# Patient Record
Sex: Female | Born: 1945
Health system: Southern US, Community
[De-identification: ages and names within clinical notes are randomized; demographics above are authoritative.]

## PROBLEM LIST (undated history)

## (undated) ENCOUNTER — Emergency Department (HOSPITAL_COMMUNITY): Admission: EM | Payer: BLUE CROSS/BLUE SHIELD | Source: Home / Self Care

## (undated) DIAGNOSIS — K8010 Calculus of gallbladder with chronic cholecystitis without obstruction: Secondary | ICD-10-CM

## (undated) DIAGNOSIS — M7551 Bursitis of right shoulder: Secondary | ICD-10-CM

## (undated) DIAGNOSIS — M26609 Unspecified temporomandibular joint disorder, unspecified side: Secondary | ICD-10-CM

## (undated) DIAGNOSIS — E785 Hyperlipidemia, unspecified: Secondary | ICD-10-CM

## (undated) DIAGNOSIS — R918 Other nonspecific abnormal finding of lung field: Secondary | ICD-10-CM

## (undated) DIAGNOSIS — Z96653 Presence of artificial knee joint, bilateral: Secondary | ICD-10-CM

## (undated) DIAGNOSIS — M1711 Unilateral primary osteoarthritis, right knee: Secondary | ICD-10-CM

## (undated) DIAGNOSIS — Z8679 Personal history of other diseases of the circulatory system: Secondary | ICD-10-CM

## (undated) DIAGNOSIS — K219 Gastro-esophageal reflux disease without esophagitis: Secondary | ICD-10-CM

## (undated) DIAGNOSIS — M069 Rheumatoid arthritis, unspecified: Secondary | ICD-10-CM

## (undated) DIAGNOSIS — M1990 Unspecified osteoarthritis, unspecified site: Secondary | ICD-10-CM

## (undated) DIAGNOSIS — I160 Hypertensive urgency: Secondary | ICD-10-CM

## (undated) DIAGNOSIS — E041 Nontoxic single thyroid nodule: Secondary | ICD-10-CM

## (undated) DIAGNOSIS — I10 Essential (primary) hypertension: Secondary | ICD-10-CM

## (undated) DIAGNOSIS — E669 Obesity, unspecified: Secondary | ICD-10-CM

## (undated) DIAGNOSIS — E119 Type 2 diabetes mellitus without complications: Secondary | ICD-10-CM

## (undated) DIAGNOSIS — R6510 Systemic inflammatory response syndrome (SIRS) of non-infectious origin without acute organ dysfunction: Secondary | ICD-10-CM

## (undated) DIAGNOSIS — D649 Anemia, unspecified: Secondary | ICD-10-CM

## (undated) DIAGNOSIS — I455 Other specified heart block: Secondary | ICD-10-CM

## (undated) DIAGNOSIS — L089 Local infection of the skin and subcutaneous tissue, unspecified: Secondary | ICD-10-CM

## (undated) DIAGNOSIS — Z79890 Hormone replacement therapy (postmenopausal): Secondary | ICD-10-CM

## (undated) DIAGNOSIS — IMO0001 Reserved for inherently not codable concepts without codable children: Secondary | ICD-10-CM

## (undated) DIAGNOSIS — K529 Noninfective gastroenteritis and colitis, unspecified: Secondary | ICD-10-CM

## (undated) DIAGNOSIS — E039 Hypothyroidism, unspecified: Secondary | ICD-10-CM

## (undated) DIAGNOSIS — E876 Hypokalemia: Secondary | ICD-10-CM

## (undated) DIAGNOSIS — E440 Moderate protein-calorie malnutrition: Secondary | ICD-10-CM

## (undated) DIAGNOSIS — D62 Acute posthemorrhagic anemia: Secondary | ICD-10-CM

## (undated) DIAGNOSIS — A419 Sepsis, unspecified organism: Secondary | ICD-10-CM

## (undated) DIAGNOSIS — B009 Herpesviral infection, unspecified: Secondary | ICD-10-CM

## (undated) DIAGNOSIS — I4891 Unspecified atrial fibrillation: Secondary | ICD-10-CM

## (undated) DIAGNOSIS — R55 Syncope and collapse: Secondary | ICD-10-CM

## (undated) DIAGNOSIS — M0579 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 without organ or systems involvement: Secondary | ICD-10-CM

## (undated) DIAGNOSIS — R112 Nausea with vomiting, unspecified: Secondary | ICD-10-CM

## (undated) DIAGNOSIS — K760 Fatty (change of) liver, not elsewhere classified: Secondary | ICD-10-CM

## (undated) HISTORY — DX: 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 without organ or systems involvement: M05.79

## (undated) HISTORY — DX: Bursitis of right shoulder: M75.51

## (undated) HISTORY — DX: Rheumatoid arthritis, unspecified: M06.9

## (undated) HISTORY — PX: TONSILLECTOMY: SUR1361

## (undated) HISTORY — PX: JOINT REPLACEMENT: SHX530

## (undated) HISTORY — PX: ABDOMINAL HYSTERECTOMY: SHX81

## (undated) HISTORY — DX: Fatty (change of) liver, not elsewhere classified: K76.0

## (undated) HISTORY — DX: Hyperlipidemia, unspecified: E78.5

## (undated) HISTORY — DX: Unspecified atrial fibrillation: I48.91

## (undated) HISTORY — DX: Obesity, unspecified: E66.9

## (undated) HISTORY — DX: Hormone replacement therapy: Z79.890

## (undated) HISTORY — PX: NASAL SINUS SURGERY: SHX719

---
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
## 1998-07-31 ENCOUNTER — Other Ambulatory Visit: Admission: RE | Admit: 1998-07-31 | Discharge: 1998-07-31 | Payer: Self-pay | Admitting: Obstetrics and Gynecology

## 1999-08-03 ENCOUNTER — Other Ambulatory Visit: Admission: RE | Admit: 1999-08-03 | Discharge: 1999-08-03 | Payer: Self-pay | Admitting: Obstetrics and Gynecology

## 2000-09-23 LAB — HM PAP SMEAR

## 2000-09-26 ENCOUNTER — Other Ambulatory Visit: Admission: RE | Admit: 2000-09-26 | Discharge: 2000-09-26 | Payer: Self-pay | Admitting: Obstetrics and Gynecology

## 2001-09-28 ENCOUNTER — Other Ambulatory Visit: Admission: RE | Admit: 2001-09-28 | Discharge: 2001-09-28 | Payer: Self-pay | Admitting: Obstetrics and Gynecology

## 2002-02-01 ENCOUNTER — Encounter: Payer: Self-pay | Admitting: *Deleted

## 2002-02-06 ENCOUNTER — Inpatient Hospital Stay (HOSPITAL_COMMUNITY): Admission: RE | Admit: 2002-02-06 | Discharge: 2002-02-11 | Payer: Self-pay | Admitting: *Deleted

## 2002-04-05 ENCOUNTER — Inpatient Hospital Stay (HOSPITAL_COMMUNITY): Admission: RE | Admit: 2002-04-05 | Discharge: 2002-04-07 | Payer: Self-pay | Admitting: *Deleted

## 2004-05-05 ENCOUNTER — Encounter: Admission: RE | Admit: 2004-05-05 | Discharge: 2004-05-05 | Payer: Self-pay | Admitting: Family Medicine

## 2005-02-10 ENCOUNTER — Observation Stay (HOSPITAL_COMMUNITY): Admission: EM | Admit: 2005-02-10 | Discharge: 2005-02-11 | Payer: Self-pay | Admitting: Emergency Medicine

## 2005-02-12 ENCOUNTER — Ambulatory Visit: Payer: Self-pay

## 2005-03-25 ENCOUNTER — Ambulatory Visit: Payer: Self-pay | Admitting: Internal Medicine

## 2005-04-05 ENCOUNTER — Encounter: Admission: RE | Admit: 2005-04-05 | Discharge: 2005-07-04 | Payer: Self-pay | Admitting: Internal Medicine

## 2006-08-24 ENCOUNTER — Emergency Department (HOSPITAL_COMMUNITY): Admission: EM | Admit: 2006-08-24 | Discharge: 2006-08-24 | Payer: Self-pay | Admitting: Emergency Medicine

## 2008-08-23 DIAGNOSIS — K760 Fatty (change of) liver, not elsewhere classified: Secondary | ICD-10-CM

## 2008-08-23 HISTORY — DX: Fatty (change of) liver, not elsewhere classified: K76.0

## 2009-02-24 ENCOUNTER — Inpatient Hospital Stay (HOSPITAL_COMMUNITY): Admission: EM | Admit: 2009-02-24 | Discharge: 2009-03-01 | Payer: Self-pay | Admitting: Emergency Medicine

## 2009-02-25 ENCOUNTER — Ambulatory Visit: Payer: Self-pay | Admitting: Gastroenterology

## 2009-02-25 ENCOUNTER — Encounter: Payer: Self-pay | Admitting: Gastroenterology

## 2010-11-29 LAB — CBC
HCT: 34.4 % — ABNORMAL LOW (ref 36.0–46.0)
HCT: 36 % (ref 36.0–46.0)
HCT: 36.2 % (ref 36.0–46.0)
HCT: 40.3 % (ref 36.0–46.0)
HCT: 40.7 % (ref 36.0–46.0)
Hemoglobin: 12.2 g/dL (ref 12.0–15.0)
Hemoglobin: 13.5 g/dL (ref 12.0–15.0)
Hemoglobin: 13.6 g/dL (ref 12.0–15.0)
MCHC: 33.4 g/dL (ref 30.0–36.0)
MCHC: 33.5 g/dL (ref 30.0–36.0)
MCV: 92.4 fL (ref 78.0–100.0)
MCV: 93 fL (ref 78.0–100.0)
MCV: 93.1 fL (ref 78.0–100.0)
MCV: 93.2 fL (ref 78.0–100.0)
MCV: 93.3 fL (ref 78.0–100.0)
Platelets: 212 10*3/uL (ref 150–400)
Platelets: 251 10*3/uL (ref 150–400)
RBC: 3.86 MIL/uL — ABNORMAL LOW (ref 3.87–5.11)
RBC: 3.88 MIL/uL (ref 3.87–5.11)
RBC: 4.41 MIL/uL (ref 3.87–5.11)
RDW: 15.4 % (ref 11.5–15.5)
RDW: 15.9 % — ABNORMAL HIGH (ref 11.5–15.5)
RDW: 16 % — ABNORMAL HIGH (ref 11.5–15.5)
WBC: 11.1 10*3/uL — ABNORMAL HIGH (ref 4.0–10.5)
WBC: 7.8 10*3/uL (ref 4.0–10.5)
WBC: 8 K/uL (ref 4.0–10.5)

## 2010-11-29 LAB — COMPREHENSIVE METABOLIC PANEL
ALT: 42 U/L — ABNORMAL HIGH (ref 0–35)
AST: 48 U/L — ABNORMAL HIGH (ref 0–37)
Albumin: 3.3 g/dL — ABNORMAL LOW (ref 3.5–5.2)
Albumin: 4.1 g/dL (ref 3.5–5.2)
Alkaline Phosphatase: 88 U/L (ref 39–117)
BUN: 11 mg/dL (ref 6–23)
Calcium: 11.1 mg/dL — ABNORMAL HIGH (ref 8.4–10.5)
Creatinine, Ser: 0.57 mg/dL (ref 0.4–1.2)
GFR calc Af Amer: >60 mL/min (ref >60)
GFR calc Af Amer: >60 mL/min (ref >60)
Potassium: 3.5 mEq/L (ref 3.5–5.1)
Potassium: 4 mEq/L (ref 3.5–5.1)
Sodium: 141 mEq/L (ref 135–145)
Total Protein: 6.2 g/dL (ref 6.0–8.3)
Total Protein: 7.8 g/dL (ref 6.0–8.3)

## 2010-11-29 LAB — URINE CULTURE
Colony Count: NO GROWTH
Culture: NO GROWTH

## 2010-11-29 LAB — POCT CARDIAC MARKERS
CKMB, poc: <1 ng/mL — ABNORMAL LOW (ref 1.0–8.0)
CKMB, poc: <1 ng/mL — ABNORMAL LOW (ref 1.0–8.0)
Myoglobin, poc: 45.8 ng/mL (ref 12–200)
Myoglobin, poc: 86.2 ng/mL (ref 12–200)
Troponin i, poc: <0.05 ng/mL (ref 0.00–0.09)
Troponin i, poc: <0.05 ng/mL (ref 0.00–0.09)

## 2010-11-29 LAB — GLUCOSE, CAPILLARY
Glucose-Capillary: 100 mg/dL — ABNORMAL HIGH (ref 70–99)
Glucose-Capillary: 100 mg/dL — ABNORMAL HIGH (ref 70–99)
Glucose-Capillary: 103 mg/dL — ABNORMAL HIGH (ref 70–99)
Glucose-Capillary: 104 mg/dL — ABNORMAL HIGH (ref 70–99)
Glucose-Capillary: 104 mg/dL — ABNORMAL HIGH (ref 70–99)
Glucose-Capillary: 107 mg/dL — ABNORMAL HIGH (ref 70–99)
Glucose-Capillary: 108 mg/dL — ABNORMAL HIGH (ref 70–99)
Glucose-Capillary: 112 mg/dL — ABNORMAL HIGH (ref 70–99)
Glucose-Capillary: 113 mg/dL — ABNORMAL HIGH (ref 70–99)
Glucose-Capillary: 114 mg/dL — ABNORMAL HIGH (ref 70–99)
Glucose-Capillary: 117 mg/dL — ABNORMAL HIGH (ref 70–99)
Glucose-Capillary: 117 mg/dL — ABNORMAL HIGH (ref 70–99)
Glucose-Capillary: 117 mg/dL — ABNORMAL HIGH (ref 70–99)
Glucose-Capillary: 118 mg/dL — ABNORMAL HIGH (ref 70–99)
Glucose-Capillary: 120 mg/dL — ABNORMAL HIGH (ref 70–99)
Glucose-Capillary: 122 mg/dL — ABNORMAL HIGH (ref 70–99)
Glucose-Capillary: 122 mg/dL — ABNORMAL HIGH (ref 70–99)
Glucose-Capillary: 123 mg/dL — ABNORMAL HIGH (ref 70–99)
Glucose-Capillary: 125 mg/dL — ABNORMAL HIGH (ref 70–99)
Glucose-Capillary: 129 mg/dL — ABNORMAL HIGH (ref 70–99)
Glucose-Capillary: 130 mg/dL — ABNORMAL HIGH (ref 70–99)
Glucose-Capillary: 136 mg/dL — ABNORMAL HIGH (ref 70–99)
Glucose-Capillary: 137 mg/dL — ABNORMAL HIGH (ref 70–99)
Glucose-Capillary: 137 mg/dL — ABNORMAL HIGH (ref 70–99)
Glucose-Capillary: 139 mg/dL — ABNORMAL HIGH (ref 70–99)
Glucose-Capillary: 141 mg/dL — ABNORMAL HIGH (ref 70–99)
Glucose-Capillary: 176 mg/dL — ABNORMAL HIGH (ref 70–99)
Glucose-Capillary: 89 mg/dL (ref 70–99)
Glucose-Capillary: 94 mg/dL (ref 70–99)
Glucose-Capillary: 95 mg/dL (ref 70–99)
Glucose-Capillary: 98 mg/dL (ref 70–99)
Glucose-Capillary: 99 mg/dL (ref 70–99)
Glucose-Capillary: 99 mg/dL (ref 70–99)

## 2010-11-29 LAB — BASIC METABOLIC PANEL
BUN: 5 mg/dL — ABNORMAL LOW (ref 6–23)
BUN: 7 mg/dL (ref 6–23)
BUN: 8 mg/dL (ref 6–23)
CO2: 24 mEq/L (ref 19–32)
CO2: 24 mEq/L (ref 19–32)
Calcium: 8.9 mg/dL (ref 8.4–10.5)
Chloride: 100 mEq/L (ref 96–112)
Chloride: 102 mEq/L (ref 96–112)
Chloride: 104 mEq/L (ref 96–112)
Chloride: 107 mEq/L (ref 96–112)
Creatinine, Ser: 0.51 mg/dL (ref 0.4–1.2)
Creatinine, Ser: 0.57 mg/dL (ref 0.4–1.2)
GFR calc Af Amer: >60 mL/min (ref >60)
GFR calc Af Amer: >60 mL/min (ref >60)
GFR calc non Af Amer: >60 mL/min (ref >60)
GFR calc non Af Amer: >60 mL/min (ref >60)
GFR calc non Af Amer: >60 mL/min (ref >60)
Glucose, Bld: 122 mg/dL — ABNORMAL HIGH (ref 70–99)
Potassium: 3.7 mEq/L (ref 3.5–5.1)
Potassium: 3.8 mEq/L (ref 3.5–5.1)
Potassium: 4 mEq/L (ref 3.5–5.1)
Sodium: 135 mEq/L (ref 135–145)
Sodium: 139 mEq/L (ref 135–145)

## 2010-11-29 LAB — COMPREHENSIVE METABOLIC PANEL WITH GFR
BUN: 14 mg/dL (ref 6–23)
CO2: 25 meq/L (ref 19–32)
Chloride: 101 meq/L (ref 96–112)
Creatinine, Ser: 0.79 mg/dL (ref 0.4–1.2)
GFR calc non Af Amer: >60 mL/min (ref >60)
Glucose, Bld: 159 mg/dL — ABNORMAL HIGH (ref 70–99)
Total Bilirubin: 0.9 mg/dL (ref 0.3–1.2)

## 2010-11-29 LAB — MAGNESIUM
Magnesium: 1.5 mg/dL (ref 1.5–2.5)
Magnesium: 1.6 mg/dL (ref 1.5–2.5)
Magnesium: 1.7 mg/dL (ref 1.5–2.5)

## 2010-11-29 LAB — HEMOGLOBIN A1C
Hgb A1c MFr Bld: 6.3 % — ABNORMAL HIGH (ref 4.6–6.1)
Mean Plasma Glucose: 134 mg/dL

## 2010-11-29 LAB — URINALYSIS, ROUTINE W REFLEX MICROSCOPIC
Glucose, UA: 100 mg/dL — AB
Ketones, ur: 40 mg/dL — AB
Nitrite: NEGATIVE
Protein, ur: >300 mg/dL — AB
Specific Gravity, Urine: 1.041 — ABNORMAL HIGH (ref 1.005–1.030)
Urobilinogen, UA: 1 mg/dL (ref 0.0–1.0)
pH: 5.5 (ref 5.0–8.0)

## 2010-11-29 LAB — URINE MICROSCOPIC-ADD ON

## 2010-11-29 LAB — DIFFERENTIAL
Basophils Absolute: 0 K/uL (ref 0.0–0.1)
Basophils Relative: 0 % (ref 0–1)
Eosinophils Absolute: 0 10*3/uL (ref 0.0–0.7)
Eosinophils Relative: 0 % (ref 0–5)
Lymphocytes Relative: 12 % (ref 12–46)
Lymphs Abs: 0.9 10*3/uL (ref 0.7–4.0)
Monocytes Absolute: 0.2 10*3/uL (ref 0.1–1.0)
Monocytes Relative: 3 % (ref 3–12)
Neutro Abs: 6.9 K/uL (ref 1.7–7.7)
Neutrophils Relative %: 85 % — ABNORMAL HIGH (ref 43–77)

## 2010-11-29 LAB — T4, FREE: Free T4: 0.77 ng/dL — ABNORMAL LOW (ref 0.80–1.80)

## 2010-11-29 LAB — TSH
TSH: 1.202 u[IU]/mL (ref 0.350–4.500)
TSH: 3.218 u[IU]/mL (ref 0.350–4.500)

## 2010-11-29 LAB — LACTIC ACID, PLASMA: Lactic Acid, Venous: 2.2 mmol/L (ref 0.5–2.2)

## 2010-11-29 LAB — LIPASE, BLOOD: Lipase: 16 U/L (ref 11–59)

## 2011-01-05 NOTE — H&P (Signed)
NAMECHANTILLY, [name] NO.:  [id number]   MEDICAL RECORD NO.:  [id number]          PATIENT TYPE:  INP   LOCATION:  1531                         FACILITY:  [facility]   PHYSICIAN:  Vania Rea, M.D. DATE OF BIRTH:  Mar 28, 1946   DATE OF ADMISSION:  02/23/2009  DATE OF DISCHARGE:                              HISTORY & PHYSICAL   PRIMARY CARE PHYSICIAN:  [facility]   CHIEF COMPLAINT:  Abdominal pain and vomiting since yesterday.   HISTORY OF PRESENT ILLNESS:  This is 65 year old obese Caucasian lady  with a history of diabetes, hypertension and hyperlipidemia who was in  her baseline state of health until the sudden onset of epigastric and  right upper quadrant pain radiating to the right subscapular area since  yesterday.  The patient says the pain has been steady and unrelenting  and for the past 12-24 hours, she has been having persistent vomiting of  bilious yellow material.  She has not noticed any fever or chills and  had no diarrhea.  She does not suffer with dyspepsia and has not had any  unusual meals.  The pain does not seem to be related to food.  She has  never had any similar problems.   PAST MEDICAL HISTORY:  1. Hypertension.  2. Diabetes.  3. Hyperlipidemia.   SURGICAL HISTORY:  She is status post left knee replacement,  hysterectomy, tonsillectomy and sinus surgery.   MEDICATIONS:  1. Benicar/hydrochlorothiazide 40/12.5 daily.  2. Metoprolol 12.5 mg twice daily.  3. Amlodipine 10 mg daily.  4. Clonidine 0.2 mg twice daily.  5. Simvastatin 40 mg at bedtime.  6. Avandamet 09/998 mg twice daily.  7. Estradiol 0.5 mg twice daily.  8. Aspirin 81 mg daily.  9. Fish oil 1000 mg daily.  10.Caltrate 600 with D twice daily.  11.Multivitamin daily.   ALLERGIES:  No known drug allergies.   SOCIAL HISTORY:  She denies tobacco or illicit drug use.  She very  occasionally drinks alcohol.  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   FAMILY HISTORY:  Significant for coronary artery disease in her father,  breast cancer in her mother.  Diabetes and hypertension also strong her  family.   REVIEW OF SYSTEMS:  On a 10-point review of systems, other than noted  above, unremarkable   PHYSICAL EXAMINATION:  An obese middle-aged Caucasian lady lying in bed.  She had a dose of Dilaudid 4 hours ago and she is now feeling a lot  better.  She is looking more comfortable.  VITAL SIGNS:  Temperature is 99.1, respirations 20, pulse 102, blood  pressure 158/79.  She is saturating 95% on room air.  Pupils are round, equal and reactive.  Mucous membranes pink and  anicteric.  She is mildly dehydrated.  No cervical lymphadenopathy or  thyromegaly.  No jugular venous distention.  CHEST:  Clear to auscultation bilaterally.  CARDIOVASCULAR SYSTEM:  Regular rhythm.  No murmur heard.  ABDOMEN:  Obese and soft but she does have exquisite epigastric  tenderness and also right  upper quadrant tenderness less so.  Normal  abdominal bowel sounds.  EXTREMITIES:  Without edema.  She has 2+ dorsalis pedis pulses  bilaterally and she has arthritic deformities of both knees.  SKIN:  Warm and dry.  There are no ulcerations.  CENTRAL NERVOUS SYSTEM:  Cranial nerves II-XII are grossly intact and  she has no focal neurologic deficit.   LABORATORY DATA:  White count is 8.0, hemoglobin 13.6, MCV 92.4,  platelet 255.  She has a normal differential on her white count.  Her  complete metabolic panel is significant for sodium of 141, potassium  4.0, chloride 101, CO2 25, BUN 14, creatinine 0.79, glucose 159.  Her  AST is slightly elevated at 48, ALT slightly elevated at 42, alkaline  phosphatase normal at 88, total bilirubin normal 0.9.  Her calcium is  elevated to 11.1, total protein and albumin are normal at 7.8 and 4.1.  Lipase is normal.  Lactic acid is normal.  Her point of care cardiac  enzymes are completely normal with  repeatedly undetectable CK-MB and  troponin.  Urinalysis shows concentrated urine with specific gravity of  1.04, 100 glucose, moderate bilirubin, 40 ketones, small amount of  blood, 300 protein, negative nitrates, leukocyte esterase small.  Microscopy shows crystals, 11-20 white cells and a few bacteria.   Ultrasound of her abdomen revealed no definite evidence of gallstones,  though extrahepatic and questionable mild intrahepatic biliary  dilatation of  unclear etiology, diffuse fatty infiltration of the  liver, right renal cyst, mild right collecting system dilation  bilaterally.  Abdominal x-rays show no acute abnormality.   ASSESSMENT:  1. Probable acute cholecystitis with probable passage of gallstone.  2. Dehydration  3. Diabetes type 2 controlled.  4. Hypertension.  5. Hyperlipidemia.  6. Obesity.   PLAN:  Will admit this lady for IV fluid rehydration, pain control.  Will get an MRCP of the abdomen and consult surgeons depending on  results.  Other plans as per orders.      Vania Rea, M.D.  Electronically Signed     LC/MEDQ  D:  02/24/2009  T:  02/24/2009  Job:  [id number]   cc:   Lear Ng, MD  Fax: [phone]   Deanna Artis, MD

## 2011-01-05 NOTE — Consult Note (Signed)
[name], [name] NO.:  [id number]   MEDICAL RECORD NO.:  [id number]          PATIENT TYPE:  INP   LOCATION:  1531                         FACILITY:  [facility]   PHYSICIAN:  Anselmo Rod, M.D.  DATE OF BIRTH:  [birth year]   DATE OF CONSULTATION:  02/24/2009  DATE OF DISCHARGE:                                 CONSULTATION   Cross cover for [facility].   REASON FOR CONSULTATION:  Abdominal pain with nausea and vomiting for 2  days.   ASSESSMENT:  1. Severe epigastric pain with nausea and vomiting, rule out viral      syndrome.  2. Abnormal LFTs with slight elevation of AST, ALT and dilated bile      ducts, ?passed stone.  3. Hypertension.  4. Fatty infiltration of the liver on MRCP.  5. Hyperlipidemia.  6. Adult onset diabetes mellitus.  7. Obesity.  8. Status post hysterectomy.  9. Status post tonsillectomy.  10.History of sinus surgery.  11.Left knee replacement.   RECOMMENDATIONS:  1. EGD in a.m.  2. PPI.  3. Avoid nonsteroidals and greasy foods.   DISCUSSION:  [name] is a 65 year old white female with above-  mentioned medical problems who was in the usual state of health until  February 21, 2009, when she had hot dogs with potato salad and ice cream for  dinner.  When she woke up on February 22, 2009, she has severe nausea,  vomiting with epigastric pain radiating to her back.  As the pain was  severe and unrelenting, she presented to the [facility] emergency room  where she was admitted for further observation and treatment.  She  claims that there was no associated fever, chills or rigors, diarrhea or  constipation. There is no history of reflux, dysphagia or odynophagia.  Appetite is good and weight has been stable.  The pain was not related  to immediate intake of fatty meal.   PAST MEDICAL HISTORY:  See list above.   ALLERGIES:  ?MONOPRIL.   MEDICATIONS:  In the hospital:  1. Ciprofloxacin.  2. Vasotec.  3. Insulin  sliding scale coverage.  4. Magnesium sulfate.  5. Lopressor.  6. Pantoprazole.  7. Acetaminophen.  8. Dulcolax.  9. Hydralazine.  10.Hydromorphone injection.  11.Zofran p.r.n.   SOCIAL HISTORY:  [place]  She is  divorced.  She is Control and instrumentation engineer by religion. She denies the use of alcohol,  tobacco or illicit drugs.  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   FAMILY HISTORY:  Is positive for coronary artery disease in her father  and breast cancer in her mother. Diabetes and hypertension in several  family members.   REVIEW OF SYSTEMS:  1. Nausea, vomiting, and epigastric pain.  No weight loss.  2. No history of dysphagia or odynophagia.  3. No genitourinary or cardiorespiratory complaints.  4. Other review of systems is negative.   PHYSICAL EXAMINATION:  GENERAL PHYSICAL EXAMINATION: The patient is an  older white female lying comfortably in bed in no acute distress  with  temperature of 98.6, blood pressure 136/78, pulse 96 per minute,  respiratory rate 18.  HEENT EXAMINATION: Reveals atraumatic, normocephalic head.  Facial  symmetry preserved.  Oropharyngeal mucosa without exudate.  NECK:  Supple.  No JVD, thyromegaly, lymphadenopathy.  CHEST: Clear to auscultation.  S1-S2 regular.  ABDOMEN: Soft with epigastric tenderness on palpation with guarding.  No  rebound or rigidity.  No hepatosplenomegaly appreciated.  RECTAL EXAMINATION:  Was deferred.   LABORATORY EVALUATION:  On admission revealed white count of 8000 with a  hemoglobin of 13.6 mL and platelets of 251, 85% neutrophils. CMP was  normal except for glucose of 159 and AST of 48 and ALT 42. Calcium was  high at 11.1. Lipase was normal at 16. Cardiac enzymes were negative.  CBC today revealed a hemoglobin of 11.5 with a MCV of 93.0, platelets  212,000, white count was normal.  CMET was normal limit except for  albumin of 3.3.  Hemoglobin A1c was slightly high at 6.3.   TSH was  normal at 1.202.  MRCP done today revealed some dilated bile ducts with  no evidence of cholecystitis and no focal liver abnormalities, mild  fatty infiltration of the liver was noted.  There was no evidence of  choledocholithiasis. Abdominal ultrasound done yesterday revealed well  distended gallbladder without stones or thickening. There were dilated  ducts noted with mild dilatation of the renal collecting systems.   PLAN:  As above.  Further recommendations will be made in follow-up.      Anselmo Rod, M.D.  Electronically Signed     JNM/MEDQ  D:  02/24/2009  T:  02/24/2009  Job:  [id number]   cc:   [facility]  [facility]

## 2011-01-08 NOTE — Discharge Summary (Signed)
NAMENIEVE, ROJERO                 ACCOUNT NO.:  [id number]   MEDICAL RECORD NO.:  [id number]          PATIENT TYPE:  INP   LOCATION:  1531                         FACILITY:  [facility]   PHYSICIAN:  Hind I Elsaid, MD      DATE OF BIRTH:  July 16, 1946   DATE OF ADMISSION:  02/23/2009  DATE OF DISCHARGE:  03/01/2009                               DISCHARGE SUMMARY   DISCHARGE DIAGNOSES:  1. Nausea and vomiting, felt to be secondary to gastroenteritis.  2. Esophagitis.  3. Hypertension.  4. Leukocytosis, resolved.  5. Diabetes mellitus.  6. Hypomagnesemia.  7. Gastroesophageal reflux disease.  8. Hypokalemia.  9. Fatty infiltration of the liver.   DISCHARGE MEDICATIONS:  1. Protonix 40 mg p.o. b.i.d.  2. Zofran 2 mg p.o. q.6 h., as needed.  3. Ativan 10 mg p.o. q.8 h., as needed.  4. Benicar/hydrochlorothiazide 40/12.5 one daily.  5. Metoprolol 12.5 mg twice daily.  6. Norvasc 10 mg daily.  7. Clonidine 0.2 mg twice daily.  8. Zocor 40 mg daily.  9. Avandamet twice daily.  10.Aspirin 81 mg daily.  11.Fish oil 1 tab daily.  12.Caltrate 1 tab twice daily.  13.Multivitamin 1 tab daily.   CONSULTATION:  GI consulted.   PROCEDURES:  1. Abdominal x-ray, no acute finding.  Elevated right hemidiaphragm.  2. Ultrasound of the abdomen, no definite gallstones are visualized.      Extrahepatic and questionable mild intrahepatic biliary dilatation      of uncertain etiology.  Diffuse fatty infiltration of the liver.      Right renal cyst.  3. Chest x-ray, no acute cardiopulmonary process.  4. MRCP.  Normal gallbladder without specific features to suggest      acute cholecystitis, increased caliber of common bile duct without      evidence of choledocholithiasis or mass.  5. Abdominal x-ray, no acute finding.   HISTORY OF PRESENT ILLNESS:  This is a 65 year old Caucasian female with  a history of diabetes, hypertension and hyperlipidemia admitted with  sudden onset of epigastric and right  upper quadrant pain radiating to  the right subscapular area.  The patient said the pain is steady and  unrelieving and it is also associated with persistent vomiting of  bilious yellow material.  She did not notice any fever or chills.  The  patient admitted for evaluation of abdominal pain, nausea and vomiting.  Noticed to have mild elevated LFTs.  Diagnosis was retroviral syndrome.  The patient had endoscopy done by GI.  Results were negative, mainly  erosive esophagitis.  The patient placed on Protonix.  Gastroenterology  recommendation was probably secondary to gastroenteritis.  The patient  remained on supportive measures.  She had a biopsy which showed benign  mild inflamed gastroesophageal junction mucosa, consistent with reflux,  no intestinal metaplasia, dysplasia or malignancy identified.  During  hospital stay, the patient followed closely and mainly with symptom  management.  On the day of discharge, the patient's symptoms had  completely resolved.  The patient was tolerating a diet.  She had a  regular bowel movement and  tolerating a regular diet.  No fever.  No  further nausea, vomiting or abdominal pain secondary to gastroenteritis  or esophagitis.  Leukocytosis completely resolved.  Hypertension, the  patient remained on her home medication.  At this time, I feel the  patient is medically stable for discharge.      Hind Bosie Helper, MD  Electronically Signed     HIE/MEDQ  D:  03/31/2009  T:  03/31/2009  Job:  [id number]

## 2011-01-08 NOTE — H&P (Signed)
NAMEALVETTA, [name] NO.:  [id number]   MEDICAL RECORD NO.:  [id number]          PATIENT TYPE:  INP   LOCATION:  1828                         FACILITY:  [facility]   PHYSICIAN:  Hettie Holstein, D.O.    DATE OF BIRTH:  1946/07/13   DATE OF ADMISSION:  02/10/2005  DATE OF DISCHARGE:                                HISTORY & PHYSICAL   PRIMARY CARE PHYSICIAN:  Dorene Grebe, M.D., of [facility]   CHIEF COMPLAINT:  Presyncope, chest discomfort.   HISTORY OF PRESENT ILLNESS:  Ms. Skipper is a 65 year old diabetic female with  hypertension, hypercholesterolemia, and no previous coronary history who  developed bradycardia and symptoms in an outpatient setting yesterday. She  had been feeling restless and unwell over the past couple of days and some  vague chest discomfort. She was referred to the emergency department for  cardiac evaluation. In the emergency department she was asymptomatic, her  rate did come up. It was noted that she was on both Cardizem and Toprol,  however with her risk factors and family history she is being admitted for  further evaluation, possible further risk stratification.   PAST MEDICAL HISTORY:  1.  Hypercholesterolemia. She does have a fatty liver.  2.  Hypertension.  3.  History of diabetes mellitus.  4.  Status post hysterectomy.  5.  Left knee arthroplasty in the past.   HOME MEDICATIONS:  1.  Multivitamin.  2.  Cardizem 360 mg daily.  3.  Metoprolol 25 mg p.o. b.i.d.  4.  Estrace.  5.  Metformin 500 b.i.d.  6.  Benicar/HCT 40/12.5 mg daily.  7.  Crestor 10 mg daily.   ALLERGIES:  No known drug allergies. She does have a cough with Monopril,  however.   FAMILY HISTORY:  Significant for diabetes and coronary artery disease on  both sides of her family.   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 She has no smoking  or tobacco history. Her family member contact is her sister, Marja Kays,  [phone].   REVIEW OF SYSTEMS:  She describes some shortness of breath and vague chest  discomfort. No nausea, vomiting, or diarrhea. She has had lightheadedness  and dizziness lately. Otherwise, she reports no other symptoms and her  review of systems is otherwise unremarkable.   PHYSICAL EXAMINATION:  GENERAL: In the emergency department she was  ambulated. She did fairly well without exertional chest pain or dyspnea. She  is alert and oriented, in no acute distress.  VITAL SIGNS: Her blood pressure is 164/80, heart rate increased from 60s to  90s, O2 saturation 94% on room air, temperature 98.7.  HEENT: Head is normocephalic and atraumatic. Extraocular movements are  intact.  CARDIOVASCULAR: Normal S1 and S2. No murmur or gallop.  LUNGS: Clear to auscultation bilaterally.  ABDOMEN: Soft, nontender. No palpable hepatosplenomegaly or masses.  EXTREMITIES: No edema. Good peripheral pulses.  NEUROLOGIC: No focal neurologic deficits.   LABORATORY DATA:  Her LFTs are within normal limits. Sodium 141, potassium  4.4, BUN 18, creatinine 0.8,  glucose 83.  Point of care are unremarkable on  admission. INR 1.0.   EKG reveals normal sinus rhythm with acute changes. There is some  questionable infarct anteriorly, but age indeterminate. A little change in  comparison to previous tracings.   ASSESSMENT:  1.  Vague chest discomfort in a female with risk factors.  2.  Near syncope.  3.  Bradycardia felt to be related to medications.  4.  Diabetes mellitus.  5.  Hypercholesterolemia.  6.  Hypertension.   PLAN AT THIS TIME:  We are going to observe Ms. Skellenger over the next 23  hours. Cycle markers, and if these are unremarkable she can receive further  risk stratification studies in the outpatient setting and will adjust her  weight control and her hypertensive medications on discharge. At this time I  am holding her Cardizem, continue her Toprol at a lower dose as well as   Benicar.       ESS/MEDQ  D:  02/10/2005  T:  02/10/2005  Job:  [id number]   cc:   Dorene Grebe, M.D.  [facility]  [address] [place], [place] [address]

## 2011-01-08 NOTE — Op Note (Signed)
[place]  Patient:    [name], [name] Visit Number: [id number] MRN: [id number]          Service Type: SUR Location: [id number] Attending Physician:  Maryanna Shape Dictated by:   Reynolds Bowl, M.D. Proc. Date: 02/06/02 Admit Date:  02/06/2002                             Operative Report  PREOPERATIVE DIAGNOSIS:  Osteoarthritis tricompartmental, left knee.  POSTOPERATIVE DIAGNOSIS:  Osteoarthritis tricompartmental, left knee.  OPERATION:  Left total knee arthroplasty.  ANESTHESIA:  General.  SURGEON:  Reynolds Bowl, M.D.  ASSISTANT:  Cammy Copa, M.D.  DESCRIPTION OF PROCEDURE:  An IV was started, was given 1 g of Ancef, then taken to the operating room where in the supine position she was given a general anesthetic by endotracheal tube.  A proximal pneumatic tourniquet was applied to her proximal thigh.  She had a Foley catheter inserted, then a soft bump was placed under her left hip, and the hip tourniquet was isolated with a U-drape.  She was then prepped from the edges of the U-drape to the tips of the toes with Duraprep.  She was then draped in usual manner, and this included the use of two Vi-drapes.  The knee was entered through a straight anterior incision, began above the patella, angled slightly to the medial side of the patellar tendon.  This was carried down to the quadriceps tendon and the medial edge of the patellar tendon at the level of the prepatellar bursa.  Soft tissues were pushed to the side enough to expose the medial retinaculum and the quadriceps tendon was divided to leave 0.25 inch of it intact medially.  On opening the joint, a moderate amount of synovial fluid was released.  Patella was everted.  The patella had osteophytes all the way around, as did the femur and tibia.  A lot of these were trimmed.  The medial side was down embriated bone, and laterally there was a marked amount of  arthrosis.  The distal femur was resected 12 mm and 5 degrees of valgus using the intramedullary guide.  We then used the sizer and determined that the distal femur size should be sized #7, and that #5 would be too small.  Using the epicondylar line and the trochlear center, we determined the rotation axis of the femoral cut, and so marked it.  That was in a 4 or 5 degrees of external rotation.  Then used the Chamfer cutter and resected the various chamfers from the distal femur.  A small plug of this bone was used to plug the intramedullary guide hole of the distal femur.  Attention was then directed to the tibia.  The tibia was exposed medially, sharp dissection all the way to the back, and the medial capsule and all elevated down a few inches.  The lateral side of the knee was exposed just freeing up the lateral capsule at its edges.  Complete meniscectomies and excision of cruciates was carried out, then using the sizing guide we decided that the tibial tray should be size 5.  Then using a Vat guide, an intramedullary guide was placed, and the distal tibia resected 10 mm with 5 degrees of posterior tilt from the AP direction.  The distal femur knot was formed using the guides.  Then we used the various trials to check our sizing.  Femoral component #7, tibial tray #5, and a 10 mm thick tibial bearing were inserted, and this allowed full extension, full flexion, and nice stability.  The tibia was therefore further prepared to accept descent of the tibial tray.  Attention was then directed to the patella.  It measured about 21 mm thick. We resected about 9 mm of its articular surface, leaving about 11 mm of patella intact, and the universal bone patella used size #3.  Peg holes were drilled with a guide.  At this point we then copiously irrigated and we cemented the component by first putting a small amount of cement into the cancellous surface, then onto the prosthesis, and then  packed the two together and removed all excess cement.  This was done on the tibia side then the femoral side, then the 10 mm trial tibial bearings were put in place, the knee allowed to fall into extension, and at that point we cemented the patellar button and placed and held it so using the clamp.  All excess cement was removed.  We allowed time for the residual cement outside the joint to harden, then went on to further search for and remove any bites of excess cement.  Having done this, the tibial tray was totally cleared, and then the 10 mm posterior stabilized size #5 tibial bearing was inserted and seated well.  At this point then, the knee had full flexion and extension, was nice and stable, and I could raise the patella over it and have it sit perpendicular to the femoral trochlea.  At that point, we then let the tourniquet down and a few bleeders were Bovie cauterized following which the knee was closed in layers, and about 20 to 30 degrees of flexion using figure-of-eight sutures of #1 Vicryl.  More superficial tissues were proximated with 2-0 Vicryl, and the skin edges were held in that position with metal staples.  Xeroform, 4 x 4, and ABDs, Kerlix, and Ace wrap applied, followed by a knee immobilizer.  There was one suction Hemovac placed to the retinaculum and brought out superolaterally.  ESTIMATED BLOOD LOSS:  350 to 400 cc.  REPLACEMENTS:  None.  At this point, the anesthesiologist began to give the patient a femoral block. Dictated by:   Reynolds Bowl, M.D. Attending Physician:  Maryanna Shape DD:  02/06/02 TD:  02/07/02 Job: [id number]

## 2011-01-08 NOTE — Op Note (Signed)
   NAMEKIEARA, SCHWARK NO.:  [id number]   MEDICAL RECORD NO.:  [id number]                   PATIENT TYPE:  INP   LOCATION:  5017                                 FACILITY:  [facility]   PHYSICIAN:  Cliffton Asters. Ivin Booty, M.D.                DATE OF BIRTH:  14-Jul-1946   DATE OF PROCEDURE:  04/05/2002  DATE OF DISCHARGE:  04/07/2002                                 OPERATIVE REPORT   PROCEDURE:  Insertion of epidural catheter for postoperative analgesia.   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  She underwent  a knee replacement in the past.  Today she underwent knee arthroscopy and  manipulation of the knee post arthroscopy.  Dr. Criss Alvine requested that she  have an epidural catheter placed for improved pain management  postoperatively.  Bedelia Person, M.D., spoke with her concerning this  preoperatively.   In Dr. Burnett Corrente absence, at the end of the case I was asked to place the  epidural catheter.  The patient was turned in the left lateral position.  Her back was prepped in a sterile fashion.  At the L3-4 interspace in the  midline, a 17-gauge Tuohy needle was passed to a loss of resistance to air,  2 cc.  There was a negative aspiration of blood or CSF, and an 18-gauge  epidural catheter was passed through the needle to the depth of 4 cm below  the end of the needle.  The needle was removed and the catheter was secured.  There was a negative aspiration of blood or CSF from the catheter, and a  bolus injection of 12 cc of 0.5% lidocaine containing 100 mcg of fentanyl  was given through the catheter.   The patient was turned supine, suctioned, and extubated in the operating  room.  She was taken to the PACU in good condition.  She was begun on a  constant infusion of bupivacaine 1/16% with 5 mcg/cc of fentanyl.  She will  be followed by the anesthesia department until the  removal of the catheter  is appropriate.                                               Cliffton Asters Ivin Booty, M.D.    DAC/MEDQ  D:  04/05/2002  T:  04/08/2002  Job:  [id number]   cc:   Anesthesia Department

## 2011-01-08 NOTE — Discharge Summary (Signed)
NAMEHARBOUR, [name]                 ACCOUNT NO.:  [id number]   MEDICAL RECORD NO.:  [id number]          PATIENT TYPE:  INP   LOCATION:  3703                         FACILITY:  [facility]   PHYSICIAN:  Danae Chen, M.D.DATE OF BIRTH:  07/11/1946   DATE OF ADMISSION:  02/10/2005  DATE OF DISCHARGE:  02/11/2005                                 DISCHARGE SUMMARY   DISCHARGE DIAGNOSIS:  1.  Atypical chest pain.  2.  Diabetes.  3.  Dyslipidemia.  4.  Hypertension.  5.  Presyncopal episode.   DISCHARGE MEDICATIONS:  The patient is to resume her home medications at  prior doses except for the following changes:  She is to stop her Cardizem  that she had been taking and decrease her Lopressor dose from 50 mg in the  morning and 25 mg at night to just 12.5 mg p.o. b.i.d.  Other medicines  include Metformin 500 mg p.o. q.a.m. and Metformin 1000 mg p.o. q.h.s.,  Benicar 1 tablet p.o. q.a.m., aspirin 81 mg p.o. daily, multi-vitamin one  p.o. daily, calcium daily, vitamin D daily, Crestor 10 mg p.o. daily.   DISCHARGE INSTRUCTIONS:  The patient is scheduled for a Cardiolite with  [facility] on Friday, June 23, at 7:30 a.m.   HOSPITAL COURSE:  The patient is a pleasant 65 year old female with a  history of diabetes, hypertension, dyslipidemia, who presented to the  emergency department with sharp substernal chest discomfort of 24 hours  duration.  She also reported that she had felt light headed but did not  actually have a syncopal episode.  She started feeling better by the time  she arrived to the ED, but due to her risk factors that include  dyslipidemia, hypertension, diabetes, age, and obesity, she was admitted for  observation and cycling of cardiac enzymes.  Her troponins and CK MBs were  negative.  Hemoglobin A1C was 7.1.  EKG was not conclusive for any acute  ischemic changes.  The patient, at the time of discharge, was stable.  She  had no complaints of chest pain.   Vital signs with blood pressure 141/78,  pulse 71, temperature 97.6, and O2 saturations 93% on room air.  Lungs were  clear.  Heart was regular, normal S1 and S2, no murmurs.  Abdomen soft.  No  peripheral edema.  At the time of discharge, the patient was clinically  stable and condition had improved.  As noted above, she does have a follow  up appointment in the morning for a Cardiolite for cardiac risk  stratification.  I did also notify the patient of the changes in her  medications and she is aware of this.       RLK/MEDQ  D:  02/11/2005  T:  02/11/2005  Job:  [id number]

## 2011-01-08 NOTE — Op Note (Signed)
[name], [name] NO.:  [id number]   MEDICAL RECORD NO.:  [id number]                   PATIENT TYPE:  INP   LOCATION:  5017                                 FACILITY:  [facility]   PHYSICIAN:  [name], M.D.                 DATE OF BIRTH:  03/07/1946   DATE OF PROCEDURE:  DATE OF DISCHARGE:  04/07/2002                                 OPERATIVE REPORT   PREOPERATIVE DIAGNOSES:  Nine weeks post cemented left total knee  arthroplasty (date of surgery 02/06/2002) with arthrofibrosis, motion  limited to approximately 10 to 60 degrees.   POSTOPERATIVE DIAGNOSES:  Nine weeks post cemented left total knee  arthroplasty (date of surgery 02/06/2002) with arthrofibrosis, motion  limited to approximately 10 to 60 degrees.   OPERATIVE PROCEDURE:  Arthroscopic debridement of femoral notch and  arthroscopic lysis of suprapatellar adhesions followed by closed  manipulation.   ANESTHESIA:  General.   DESCRIPTION OF PROCEDURE:  The patient was given general anesthesia by  endotracheal tube while in the supine position. All prominent areas were  padded. She had been given 1 g Ancef IV. We draped and prepped her in the  standard fashion for arthroscopy. With anesthesia, she would lay in close to  10 degrees of extension and I could push on her knee and she would come out  to almost full extension, but then if I laid her back again she would still  tend to hold at some flexion contracture. Then the knee would flex to about  60 degrees.   I then established anteromedial and anterolateral portals for arthroscopy  after elevating the tourniquet to 300 mmHg. At this point I did not have a  dry field, therefore, I dropped the tourniquet and rewrapped the leg with an  Esmarch bandage and elevated the proximal tourniquet to 350 mmHg and that  helped a little more. With the arthroscope, it was still difficult because  of lighting etc., and reflections and scar to see  everything entirely  clearly, but I could see the notch fairly well and with the tibial spine in  mind, I carefully used the rotary meniscotome to debride away scar so that I  could see most of the tibial spine. This was done in such a way as to avoid  scratching the femoral component. I then put the arthroscope up into the  suprapatellar area and that was just dense scar and was hard to visualize,  and then with the rotary meniscotome aside the arthroscope I could see  enough to sweep the arthroscope, lyse the adhesions and remove some of them  with the rotary meniscotome. When I had progressed to the point that I felt  I could not progress anymore, I removed the instruments and did a closed  manipulation. At this point, the knee did go into extension more easily and  with closed  manipulation, I could slowly bring the knee to about 105 degrees  of flexion. At that point I felt like we were not going to make anymore  progress and I did not want to stress it further. When I released pressure  the knee would rest in about 95 degrees of flexion, so I believe we got  considerable gain. At this point the knee was injected with Marcaine and  epinephrine and 1 cc of Kenalog. The portals were closed and a somewhat  bulky dressing applied. The anesthesiologist was going to do an epidural  steroid injection and the patient was going to be begun on a CPM machine.                                                [name], M.D.    [name]  D:  04/05/2002  T:  04/09/2002  Job:  [id number]

## 2011-01-08 NOTE — H&P (Signed)
[place]  Patient:    [name], [name] Visit Number: [id number] MRN: [id number]          Service Type: Attending:  Reynolds Bowl, M.D. Dictated by:   Reynolds Bowl, M.D.                           History and Physical  HISTORY OF PRESENT ILLNESS:  [name] is a 65 year old lady with complaint of pain in her left knee.  I have followed this lady since 1989 with pain in her left knee.  She has noted since then gradually increasing pain such that now it is bothersome to her with all activities, including sleep.  When she was here, in February of 2003, the knee was injected with a steroid, but that did not provide significant relief.  At this time, she has presented and requested that we proceed on with left total knee arthroplasty as has been fully discussed with her.  We discussed the possibility for limited motion, pain, infections, phlebitis, etc., and she would like to proceed on anyway.  PAST MEDICAL HISTORY:  Her primary care physician is Elvina Sidle, M.D.  CURRENT MEDICATIONS: 1. Estradiol. 2. Avalide.  ALLERGIES:  She has no known allergies.  PAST SURGICAL HISTORY:  Hysterectomy in 1995.  FAMILY HISTORY:  Positive for diabetes, heart disease, and high blood pressure.  SOCIAL HISTORY:  The patient does not smoke cigarettes nor drink ethanol.  She is divorced.  She has full-time care of three grandchildren, but has made arrangements for other care during her convalescence.  PHYSICAL EXAMINATION:  She is 5 feet 2 inches and weighs 192 pounds.  VITAL SIGNS:  Blood pressure 138/96, respirations 16, pulse 68, temperature 98.9 degrees.  Overall she appears healthy, but overweight.  HEENT:  There are caries of her teeth.  The tympanic membranes are intact. Extraocular movements full.  NECK:  The neck moves well.  No carotid bruits.  No adenopathy.  LUNGS:  She has good volume.  Her chest sounds are clear.  HEART:  Regular  rhythm.  No murmurs heard.  ABDOMEN:  Obese, soft, and nontender.  No masses.  Bowel sounds normal.  ORTHOPEDICS:  She uses a cane.  She walks with a limp.  She has large adipose masses medially.  The right knee motion is from 0-105 and the left is from 5-95 and this motion is painful.  She is mildly crepitant.  The knee is stable.  Pulses are good.  There is trace edema.  RADIOLOGY:  X-rays taken today show tricompartmental osteoarthritis not radiologically different from that five months ago.  ADMISSION DIAGNOSIS: 1. Left knee osteoarthritis, tricompartmental. 2. Hypertension. 3. Obesity.  PLAN:  Left total knee arthroplasty.  I have fully discussed with her all of the possible complications and the fact that there are no guarantees.  She would like to proceed.  She was given a prescription today for postoperative Tylox, Darvocet, Coumadin, and Ambien. Dictated by:   Reynolds Bowl, M.D. Attending:  Reynolds Bowl, M.D. DD:  01/30/02 TD:  02/01/02 Job: [id number]

## 2011-01-08 NOTE — H&P (Signed)
[name], [name] NO.:  [id number]   MEDICAL RECORD NO.:  [id number]                   PATIENT TYPE:  INP   LOCATION:  5017                                 FACILITY:  [facility]   PHYSICIAN:  Reynolds Bowl, M.D.                 DATE OF BIRTH:  July 27, 1946   DATE OF ADMISSION:  04/05/2002  DATE OF DISCHARGE:  04/07/2002                                HISTORY & PHYSICAL   HISTORY:  The patient is 65 year old lady, nine weeks postoperative cemented  right total knee arthroplasty (DOS February 06, 2002).  She has obtained  excellent relief of pain but she cannot increase her motion satisfactorily,  despite working with physical therapy.  At surgery, she had full range of  motion and the patella could stand perpendicular to the trochlear groove.  It is felt she has developed arthrofibrosis and could improve her motion  with arthroscopic lysis of adhesions and manipulation.  I have discussed  with her possible complications and she would like to proceed.   ALLERGIES:  None known.   PRIMARY CARE Hayden Kihara:  Her family physician is Dr. Elvina Sidle.   MEDICATIONS:  Estradiol and then for high blood pressure, she is on Avalide  q.d. and Cardizem q.h.s.   PREVIOUS SURGERY:  Hysterectomy in 1995.   FAMILY HISTORY:  Family history is positive for heart disease, hypertension  and diabetes.   SOCIAL HISTORY:  She does not smoke cigarettes nor drink ethanol.   REVIEW OF SYSTEMS:  Review of systems is unchanged from previous admissions.   PHYSICAL EXAMINATION:  VITAL SIGNS:  On exam, temperature is 98.7, pulse 72,  respirations 16, blood pressure 132/82.  HEENT:  She is wearing glasses.  Extraocular movements are full.  Her  tympanic membranes are normal.  Her pharynx is clear.  She has a fractured  carious tooth.  NECK:  The neck has no palpable masses.  There is no carotid bruit.  HEART:  Regular rate and rhythm.  No murmurs heard.  CHEST:  Good volume.   Clear breath sounds.  ABDOMEN:  Round, soft and nontender.  No masses.  Bowel sounds are normal.  EXTREMITIES:  Evaluation of the right knee discloses good perfusion, a well-  healed, nontender scar, slightly mobile patella, stable knee, motion from  about 10 to 60 degrees.   LABORATORY AND ACCESSORY CLINICAL DATA:  X-rays show excellent alignment and  cementing.   ADMITTING DIAGNOSIS:  Arthrofibrosis, post cemented left total knee  arthroplasty, date of surgery February 05, 2002.    PLAN:  Arthroscopic lysis of adhesions followed by closed manipulation.  No  guarantees have been given.  The patient would like to proceed.  Reynolds Bowl, M.D.    JWK/MEDQ  D:  04/04/2002  T:  04/07/2002  Job:  [id number]

## 2011-01-08 NOTE — Discharge Summary (Signed)
[place]  Patient:    [name], FLAIM Visit Number: [id number] MRN: [id number]          Service Type: SUR Location: 5000 [id number] [id number] Attending Physician:  Maryanna Shape Dictated by:   Reynolds Bowl, M.D. Admit Date:  02/06/2002 Discharge Date: 02/11/2002                             Discharge Summary  OPERATIVE PROCEDURE:  June 17:  Cemented left total knee arthroplasty.  ADMISSION DIAGNOSES:  Tricompartment osteoarthritis left knee and hypertension.  DISCHARGE DIAGNOSES:  Tricompartment osteoarthritis left knee and hypertension.  HISTORY OF PRESENT ILLNESS:  For history and physical, see that dictated on admission.  HOSPITAL COURSE:  On the day of admission the patient was taken to the operating room where she underwent left total knee cemented arthroplasty as detailed in the operative notes.  Components used:  Osteon X posterior stabilized component #7, tibial tray #5, tibial bearing insert 10 mm, universal dome patella #3.  Preoperatively and postoperatively the patient was on prophylactic antibiotics.  Postoperatively she was begun on Coumadin protocol with a goal of INR being 2.0.  She was begun on continuous passive motion immediately postoperatively.  The day postoperatively the suction Hemovac was removed. The day later Foley catheter was removed.  Her motion was progressed.  She was ambulating using a walker, weightbearing as tolerated.  She was then discharged by Dr. August Saucer on June 22.  Instructions were to take Tylox or Vicodin as needed for pain, Coumadin per protocol, all usual medications.  Work with home physical therapist.  Do frequent quadriceps sitting exercises as well as ankle pumps or toe rises.  Call me with any concerns.  Otherwise, see me in the office as planned.  Some of the laboratory data obtained for this admission:  EKG:  Normal sinus rhythm, normal EKG.  CBC was normal.  Admission hemoglobin 12.9,  discharge hemoglobin 10.0.  Discharge INR 1.6.  Routine chemistries normal.  Total protein 6.7, albumin 3.6.  Liver enzymes normal.  Urinalysis normal. Dictated by:   Reynolds Bowl, M.D. Attending Physician:  Maryanna Shape DD:  02/20/02 TD:  02/21/02 Job: [id number]

## 2011-01-13 ENCOUNTER — Other Ambulatory Visit: Payer: Self-pay | Admitting: Obstetrics and Gynecology

## 2011-01-19 LAB — HM MAMMOGRAPHY: HM Mammogram: NEGATIVE

## 2012-07-25 ENCOUNTER — Encounter (HOSPITAL_COMMUNITY): Payer: Self-pay

## 2012-07-25 ENCOUNTER — Other Ambulatory Visit (HOSPITAL_COMMUNITY): Payer: Self-pay | Admitting: Orthopedic Surgery

## 2012-07-26 NOTE — Pre-Procedure Instructions (Signed)
20 [name]  07/26/2012   Your procedure is scheduled on:  Tuesday, December 10th.  Report to [facility] at 9:00AM.  Call this number if you have problems the morning of surgery: [phone]   Remember:     Take these medicines the morning of surgery with A SIP OF WATER: Amlodipine (Norvasc), Clonidine (Catapres), Metoprolol (Lopressor) and Omeprazole (Prilosec).    Do not wear jewelry, make-up or nail polish.  Do not wear lotions, powders, or perfumes. You may wear deodorant.  Do not shave 48 hours prior to surgery. Men may shave face and neck.  Do not bring valuables to the hospital.  Contacts, dentures or bridgework may not be worn into surgery.  Leave suitcase in the car. After surgery it may be brought to your room.  For patients admitted to the hospital, checkout time is 11:00 AM the day of discharge.   Patients discharged the day of surgery will not be allowed to drive home.  Name and phone number of your driver: NA   Special Instructions: Shower using CHG 2 nights before surgery and the night before surgery.  If you shower the day of surgery use CHG.  Use special wash - you have one bottle of CHG for all showers.  You should use approximately 1/3 of the bottle for each shower.     Please read over the following fact sheets that you were given: Pain Booklet, Coughing and Deep Breathing, Blood Transfusion Information and Surgical Site Infection Prevention

## 2012-07-27 ENCOUNTER — Encounter (HOSPITAL_COMMUNITY): Payer: Self-pay

## 2012-07-27 ENCOUNTER — Encounter (HOSPITAL_COMMUNITY)
Admission: RE | Admit: 2012-07-27 | Discharge: 2012-07-27 | Disposition: A | Discharge status: Home / Self Care | Payer: BC Managed Care – PPO | Source: Ambulatory Visit | Attending: Orthopedic Surgery | Admitting: Orthopedic Surgery

## 2012-07-27 HISTORY — DX: Gastro-esophageal reflux disease without esophagitis: K21.9

## 2012-07-27 HISTORY — DX: Type 2 diabetes mellitus without complications: E11.9

## 2012-07-27 HISTORY — DX: Reserved for inherently not codable concepts without codable children: IMO0001

## 2012-07-27 HISTORY — DX: Unspecified osteoarthritis, unspecified site: M19.90

## 2012-07-27 HISTORY — DX: Essential (primary) hypertension: I10

## 2012-07-27 LAB — TYPE AND SCREEN
ABO/RH(D): A POS
Antibody Screen: NEGATIVE

## 2012-07-27 LAB — BASIC METABOLIC PANEL
BUN: 19 mg/dL (ref 6–23)
Chloride: 97 mEq/L (ref 96–112)
Creatinine, Ser: 0.97 mg/dL (ref 0.50–1.10)
Glucose, Bld: 112 mg/dL — ABNORMAL HIGH (ref 70–99)
Potassium: 3.6 mEq/L (ref 3.5–5.1)

## 2012-07-27 LAB — URINALYSIS, ROUTINE W REFLEX MICROSCOPIC
Ketones, ur: 15 mg/dL — AB
Nitrite: NEGATIVE
Protein, ur: NEGATIVE mg/dL

## 2012-07-27 LAB — URINE MICROSCOPIC-ADD ON

## 2012-07-27 LAB — CBC
HCT: 36.8 % (ref 36.0–46.0)
Hemoglobin: 12 g/dL (ref 12.0–15.0)
MCH: 29.9 pg (ref 26.0–34.0)
MCHC: 32.6 g/dL (ref 30.0–36.0)
MCV: 91.8 fL (ref 78.0–100.0)
Platelets: 284 10*3/uL (ref 150–400)

## 2012-07-27 LAB — SURGICAL PCR SCREEN
MRSA, PCR: NEGATIVE
Staphylococcus aureus: POSITIVE — AB

## 2012-07-28 LAB — URINE CULTURE

## 2012-07-31 MED ORDER — CEFAZOLIN SODIUM-DEXTROSE 2-3 GM-% IV SOLR
2.0000 g | INTRAVENOUS | Status: AC
  Administered 2012-08-01: 2 g via INTRAVENOUS
  Filled 2012-07-31: qty 50

## 2012-08-01 ENCOUNTER — Encounter (HOSPITAL_COMMUNITY): Payer: Self-pay | Admitting: Anesthesiology

## 2012-08-01 ENCOUNTER — Encounter (HOSPITAL_COMMUNITY)
Admission: RE | Disposition: A | Discharge status: Home / Self Care | Payer: Self-pay | Source: Ambulatory Visit | Attending: Orthopedic Surgery

## 2012-08-01 ENCOUNTER — Inpatient Hospital Stay (HOSPITAL_COMMUNITY): Payer: BC Managed Care – PPO | Admitting: Anesthesiology

## 2012-08-01 ENCOUNTER — Inpatient Hospital Stay (HOSPITAL_COMMUNITY)
Admission: RE | Admit: 2012-08-01 | Discharge: 2012-08-05 | DRG: 209 | Disposition: A | Discharge status: Home / Self Care | Payer: BC Managed Care – PPO | Source: Ambulatory Visit | Attending: Orthopedic Surgery | Admitting: Orthopedic Surgery

## 2012-08-01 DIAGNOSIS — D62 Acute posthemorrhagic anemia: Secondary | ICD-10-CM | POA: Diagnosis not present

## 2012-08-01 DIAGNOSIS — E119 Type 2 diabetes mellitus without complications: Secondary | ICD-10-CM | POA: Diagnosis present

## 2012-08-01 DIAGNOSIS — I10 Essential (primary) hypertension: Secondary | ICD-10-CM | POA: Diagnosis present

## 2012-08-01 DIAGNOSIS — Z7982 Long term (current) use of aspirin: Secondary | ICD-10-CM

## 2012-08-01 DIAGNOSIS — Z79899 Other long term (current) drug therapy: Secondary | ICD-10-CM

## 2012-08-01 DIAGNOSIS — K219 Gastro-esophageal reflux disease without esophagitis: Secondary | ICD-10-CM | POA: Diagnosis present

## 2012-08-01 DIAGNOSIS — Z96659 Presence of unspecified artificial knee joint: Secondary | ICD-10-CM

## 2012-08-01 DIAGNOSIS — F17200 Nicotine dependence, unspecified, uncomplicated: Secondary | ICD-10-CM | POA: Diagnosis present

## 2012-08-01 DIAGNOSIS — M1710 Unilateral primary osteoarthritis, unspecified knee: Principal | ICD-10-CM

## 2012-08-01 DIAGNOSIS — M1711 Unilateral primary osteoarthritis, right knee: Secondary | ICD-10-CM | POA: Diagnosis present

## 2012-08-01 HISTORY — PX: TOTAL KNEE ARTHROPLASTY: SHX125

## 2012-08-01 LAB — GLUCOSE, CAPILLARY: Glucose-Capillary: 154 mg/dL — ABNORMAL HIGH (ref 70–99)

## 2012-08-01 SURGERY — ARTHROPLASTY, KNEE, TOTAL
Anesthesia: General | Site: Knee | Laterality: Right | Wound class: Clean

## 2012-08-01 MED ORDER — PHENOL 1.4 % MT LIQD
1.0000 | OROMUCOSAL | Status: DC | PRN
  Administered 2012-08-02: 1 via OROMUCOSAL
  Filled 2012-08-01: qty 177

## 2012-08-01 MED ORDER — MENTHOL 3 MG MT LOZG: 1.0000 | LOZENGE | OROMUCOSAL | Status: DC | PRN

## 2012-08-01 MED ORDER — OXYCODONE HCL 5 MG PO TABS
5.0000 mg | ORAL_TABLET | ORAL | Status: DC | PRN
  Administered 2012-08-03 – 2012-08-05 (×8): 10 mg via ORAL
  Filled 2012-08-01 (×8): qty 2

## 2012-08-01 MED ORDER — PATIENT'S GUIDE TO USING COUMADIN BOOK
Freq: Once | Status: AC
  Administered 2012-08-01: 20:00:00
  Filled 2012-08-01: qty 1

## 2012-08-01 MED ORDER — CELECOXIB 200 MG PO CAPS
200.0000 mg | ORAL_CAPSULE | Freq: Two times a day (BID) | ORAL | Status: DC
  Administered 2012-08-01 – 2012-08-05 (×8): 200 mg via ORAL
  Filled 2012-08-01 (×9): qty 1

## 2012-08-01 MED ORDER — THERA M PLUS PO TABS: 1.0000 | ORAL_TABLET | Freq: Every day | ORAL | Status: DC

## 2012-08-01 MED ORDER — CHLORHEXIDINE GLUCONATE 4 % EX LIQD: 60.0000 mL | Freq: Once | CUTANEOUS | Status: DC

## 2012-08-01 MED ORDER — LACTATED RINGERS IV SOLN
INTRAVENOUS | Status: DC | PRN
  Administered 2012-08-01: 12:00:00 via INTRAVENOUS

## 2012-08-01 MED ORDER — WHITE PETROLATUM GEL
Status: AC
  Administered 2012-08-01: 23:00:00
  Filled 2012-08-01: qty 5

## 2012-08-01 MED ORDER — ONDANSETRON HCL 4 MG/2ML IJ SOLN: 4.0000 mg | Freq: Once | INTRAMUSCULAR | Status: DC | PRN

## 2012-08-01 MED ORDER — ONDANSETRON HCL 4 MG PO TABS: 4.0000 mg | ORAL_TABLET | Freq: Four times a day (QID) | ORAL | Status: DC | PRN

## 2012-08-01 MED ORDER — MIDAZOLAM HCL 2 MG/2ML IJ SOLN
INTRAMUSCULAR | Status: AC
  Filled 2012-08-01: qty 2

## 2012-08-01 MED ORDER — LOSARTAN POTASSIUM 50 MG PO TABS
50.0000 mg | ORAL_TABLET | Freq: Every day | ORAL | Status: DC
  Administered 2012-08-02 – 2012-08-05 (×4): 50 mg via ORAL
  Filled 2012-08-01 (×5): qty 1

## 2012-08-01 MED ORDER — METOCLOPRAMIDE HCL 10 MG PO TABS: 5.0000 mg | ORAL_TABLET | Freq: Three times a day (TID) | ORAL | Status: DC | PRN

## 2012-08-01 MED ORDER — FENTANYL CITRATE 0.05 MG/ML IJ SOLN: 100.0000 ug | Freq: Once | INTRAMUSCULAR | Status: DC

## 2012-08-01 MED ORDER — ONDANSETRON HCL 4 MG/2ML IJ SOLN
4.0000 mg | Freq: Four times a day (QID) | INTRAMUSCULAR | Status: DC | PRN
  Administered 2012-08-04: 4 mg via INTRAVENOUS
  Filled 2012-08-01: qty 2

## 2012-08-01 MED ORDER — OXYCODONE HCL 5 MG/5ML PO SOLN: 5.0000 mg | Freq: Once | ORAL | Status: DC | PRN

## 2012-08-01 MED ORDER — LACTATED RINGERS IV SOLN
INTRAVENOUS | Status: DC
  Administered 2012-08-01: 11:00:00 via INTRAVENOUS

## 2012-08-01 MED ORDER — ACETAMINOPHEN 10 MG/ML IV SOLN
INTRAVENOUS | Status: DC | PRN
  Administered 2012-08-01: 1000 mg via INTRAVENOUS

## 2012-08-01 MED ORDER — MEPERIDINE HCL 25 MG/ML IJ SOLN: 6.2500 mg | INTRAMUSCULAR | Status: DC | PRN

## 2012-08-01 MED ORDER — BUPIVACAINE HCL (PF) 0.25 % IJ SOLN
INTRAMUSCULAR | Status: AC
  Filled 2012-08-01: qty 30

## 2012-08-01 MED ORDER — MIDAZOLAM HCL 2 MG/2ML IJ SOLN: 1.0000 mg | INTRAMUSCULAR | Status: DC | PRN

## 2012-08-01 MED ORDER — CLONIDINE HCL (ANALGESIA) 100 MCG/ML EP SOLN
150.0000 ug | EPIDURAL | Status: DC
  Filled 2012-08-01: qty 1.5

## 2012-08-01 MED ORDER — PANTOPRAZOLE SODIUM 40 MG PO TBEC
40.0000 mg | DELAYED_RELEASE_TABLET | Freq: Every day | ORAL | Status: DC
  Administered 2012-08-02 – 2012-08-05 (×4): 40 mg via ORAL
  Filled 2012-08-01 (×3): qty 1

## 2012-08-01 MED ORDER — 0.9 % SODIUM CHLORIDE (POUR BTL) OPTIME
TOPICAL | Status: DC | PRN
  Administered 2012-08-01 (×2): 1000 mL

## 2012-08-01 MED ORDER — ONDANSETRON HCL 4 MG/2ML IJ SOLN
INTRAMUSCULAR | Status: DC | PRN
  Administered 2012-08-01: 4 mg via INTRAVENOUS

## 2012-08-01 MED ORDER — HYDROMORPHONE BOLUS VIA INFUSION
INTRAVENOUS | Status: DC | PRN
  Administered 2012-08-01 (×2): 0.5 mg via INTRAVENOUS

## 2012-08-01 MED ORDER — POTASSIUM CHLORIDE IN NACL 20-0.9 MEQ/L-% IV SOLN
INTRAVENOUS | Status: DC
  Administered 2012-08-01: 19:00:00 via INTRAVENOUS
  Filled 2012-08-01 (×2): qty 1000

## 2012-08-01 MED ORDER — GLIPIZIDE 5 MG PO TABS
5.0000 mg | ORAL_TABLET | Freq: Every day | ORAL | Status: DC
  Administered 2012-08-02 – 2012-08-05 (×4): 5 mg via ORAL
  Filled 2012-08-01 (×5): qty 1

## 2012-08-01 MED ORDER — AMLODIPINE BESYLATE 10 MG PO TABS
10.0000 mg | ORAL_TABLET | Freq: Every day | ORAL | Status: DC
  Administered 2012-08-02 – 2012-08-05 (×4): 10 mg via ORAL
  Filled 2012-08-01 (×4): qty 1

## 2012-08-01 MED ORDER — METOPROLOL TARTRATE 12.5 MG HALF TABLET
12.5000 mg | ORAL_TABLET | Freq: Two times a day (BID) | ORAL | Status: DC
  Administered 2012-08-01 – 2012-08-05 (×8): 12.5 mg via ORAL
  Filled 2012-08-01 (×9): qty 1

## 2012-08-01 MED ORDER — DIPHENHYDRAMINE HCL 50 MG/ML IJ SOLN
12.5000 mg | Freq: Four times a day (QID) | INTRAMUSCULAR | Status: DC | PRN
  Filled 2012-08-01: qty 1

## 2012-08-01 MED ORDER — MORPHINE SULFATE 4 MG/ML IJ SOLN
INTRAMUSCULAR | Status: AC
  Filled 2012-08-01: qty 1

## 2012-08-01 MED ORDER — PNEUMOCOCCAL VAC POLYVALENT 25 MCG/0.5ML IJ INJ
0.5000 mL | INJECTION | INTRAMUSCULAR | Status: AC
  Administered 2012-08-02: 0.5 mL via INTRAMUSCULAR
  Filled 2012-08-01 (×2): qty 0.5

## 2012-08-01 MED ORDER — INSULIN ASPART 100 UNIT/ML ~~LOC~~ SOLN
0.0000 [IU] | Freq: Three times a day (TID) | SUBCUTANEOUS | Status: DC
Start: 2012-08-01 — End: 2012-08-05
  Administered 2012-08-02 (×2): 2 [IU] via SUBCUTANEOUS
  Administered 2012-08-03: 3 [IU] via SUBCUTANEOUS
  Administered 2012-08-04 – 2012-08-05 (×2): 2 [IU] via SUBCUTANEOUS

## 2012-08-01 MED ORDER — ADULT MULTIVITAMIN W/MINERALS CH
1.0000 | ORAL_TABLET | Freq: Every day | ORAL | Status: DC
  Administered 2012-08-02 – 2012-08-05 (×4): 1 via ORAL
  Filled 2012-08-01 (×4): qty 1

## 2012-08-01 MED ORDER — WARFARIN SODIUM 5 MG PO TABS
5.0000 mg | ORAL_TABLET | Freq: Once | ORAL | Status: AC
  Administered 2012-08-01: 5 mg via ORAL
  Filled 2012-08-01: qty 1

## 2012-08-01 MED ORDER — FENTANYL CITRATE 0.05 MG/ML IJ SOLN
INTRAMUSCULAR | Status: DC | PRN
  Administered 2012-08-01 (×6): 25 ug via INTRAVENOUS
  Administered 2012-08-01: 50 ug via INTRAVENOUS
  Administered 2012-08-01 (×2): 25 ug via INTRAVENOUS
  Administered 2012-08-01: 50 ug via INTRAVENOUS
  Administered 2012-08-01: 25 ug via INTRAVENOUS
  Administered 2012-08-01 (×2): 50 ug via INTRAVENOUS
  Administered 2012-08-01 (×3): 25 ug via INTRAVENOUS

## 2012-08-01 MED ORDER — SODIUM CHLORIDE 0.9 % IJ SOLN: 9.0000 mL | INTRAMUSCULAR | Status: DC | PRN

## 2012-08-01 MED ORDER — OXYCODONE HCL 5 MG PO TABS: 5.0000 mg | ORAL_TABLET | Freq: Once | ORAL | Status: DC | PRN

## 2012-08-01 MED ORDER — LIDOCAINE HCL (CARDIAC) 20 MG/ML IV SOLN
INTRAVENOUS | Status: DC | PRN
  Administered 2012-08-01: 100 mg via INTRAVENOUS

## 2012-08-01 MED ORDER — DIPHENHYDRAMINE HCL 12.5 MG/5ML PO ELIX
12.5000 mg | ORAL_SOLUTION | Freq: Four times a day (QID) | ORAL | Status: DC | PRN
  Administered 2012-08-02 (×2): 12.5 mg via ORAL
  Filled 2012-08-01 (×2): qty 10

## 2012-08-01 MED ORDER — METFORMIN HCL 500 MG PO TABS
1000.0000 mg | ORAL_TABLET | Freq: Two times a day (BID) | ORAL | Status: DC
  Administered 2012-08-02 – 2012-08-05 (×7): 1000 mg via ORAL
  Filled 2012-08-01 (×10): qty 2

## 2012-08-01 MED ORDER — WARFARIN - PHARMACIST DOSING INPATIENT: Freq: Every day | Status: DC

## 2012-08-01 MED ORDER — HYDROMORPHONE HCL PF 1 MG/ML IJ SOLN
INTRAMUSCULAR | Status: AC
  Administered 2012-08-01: 0.5 mg via INTRAVENOUS
  Filled 2012-08-01: qty 1

## 2012-08-01 MED ORDER — ARTIFICIAL TEARS OP OINT
TOPICAL_OINTMENT | OPHTHALMIC | Status: DC | PRN
  Administered 2012-08-01: 1 via OPHTHALMIC

## 2012-08-01 MED ORDER — HYDROCHLOROTHIAZIDE 12.5 MG PO CAPS
12.5000 mg | ORAL_CAPSULE | Freq: Every day | ORAL | Status: DC
  Administered 2012-08-02 – 2012-08-05 (×4): 12.5 mg via ORAL
  Filled 2012-08-01 (×5): qty 1

## 2012-08-01 MED ORDER — FENTANYL CITRATE 0.05 MG/ML IJ SOLN
INTRAMUSCULAR | Status: AC
  Filled 2012-08-01: qty 2

## 2012-08-01 MED ORDER — CEFAZOLIN SODIUM 1-5 GM-% IV SOLN
1.0000 g | Freq: Three times a day (TID) | INTRAVENOUS | Status: AC
  Administered 2012-08-01 – 2012-08-02 (×2): 1 g via INTRAVENOUS
  Filled 2012-08-01 (×2): qty 50

## 2012-08-01 MED ORDER — PROPOFOL 10 MG/ML IV BOLUS
INTRAVENOUS | Status: DC | PRN
  Administered 2012-08-01: 150 mg via INTRAVENOUS
  Administered 2012-08-01: 50 mg via INTRAVENOUS

## 2012-08-01 MED ORDER — HYDROMORPHONE HCL PF 1 MG/ML IJ SOLN: 0.5000 mg | INTRAMUSCULAR | Status: DC | PRN

## 2012-08-01 MED ORDER — NALOXONE HCL 0.4 MG/ML IJ SOLN: 0.4000 mg | INTRAMUSCULAR | Status: DC | PRN

## 2012-08-01 MED ORDER — METHOCARBAMOL 500 MG PO TABS
500.0000 mg | ORAL_TABLET | Freq: Four times a day (QID) | ORAL | Status: DC | PRN
  Administered 2012-08-02: 500 mg via ORAL
  Filled 2012-08-01 (×2): qty 1

## 2012-08-01 MED ORDER — MORPHINE SULFATE (PF) 1 MG/ML IV SOLN
INTRAVENOUS | Status: DC
  Administered 2012-08-01: 14 mg via INTRAVENOUS
  Administered 2012-08-01 – 2012-08-02 (×3): via INTRAVENOUS
  Administered 2012-08-02: 15 mg via INTRAVENOUS
  Administered 2012-08-02: 14.9 mg via INTRAVENOUS
  Administered 2012-08-02: 5 mg via INTRAVENOUS
  Administered 2012-08-02: 9 mg via INTRAVENOUS
  Administered 2012-08-02: 06:00:00 via INTRAVENOUS
  Administered 2012-08-02: 15.91 mg via INTRAVENOUS
  Administered 2012-08-03: 1 mg via INTRAVENOUS
  Filled 2012-08-01 (×3): qty 25

## 2012-08-01 MED ORDER — MORPHINE SULFATE (PF) 1 MG/ML IV SOLN
INTRAVENOUS | Status: AC
  Filled 2012-08-01: qty 25

## 2012-08-01 MED ORDER — DOCUSATE SODIUM 100 MG PO CAPS
100.0000 mg | ORAL_CAPSULE | Freq: Two times a day (BID) | ORAL | Status: DC
  Administered 2012-08-01 – 2012-08-05 (×8): 100 mg via ORAL
  Filled 2012-08-01 (×8): qty 1

## 2012-08-01 MED ORDER — METOCLOPRAMIDE HCL 5 MG/ML IJ SOLN: 5.0000 mg | Freq: Three times a day (TID) | INTRAMUSCULAR | Status: DC | PRN

## 2012-08-01 MED ORDER — ATORVASTATIN CALCIUM 40 MG PO TABS
40.0000 mg | ORAL_TABLET | Freq: Every day | ORAL | Status: DC
  Administered 2012-08-02 – 2012-08-04 (×3): 40 mg via ORAL
  Filled 2012-08-01 (×5): qty 1

## 2012-08-01 MED ORDER — MIDAZOLAM HCL 5 MG/5ML IJ SOLN
INTRAMUSCULAR | Status: DC | PRN
  Administered 2012-08-01 (×2): 1 mg via INTRAVENOUS

## 2012-08-01 MED ORDER — BUPIVACAINE HCL (PF) 0.25 % IJ SOLN
INTRAMUSCULAR | Status: DC | PRN
  Administered 2012-08-01: 30 mL

## 2012-08-01 MED ORDER — CLONIDINE HCL (ANALGESIA) 100 MCG/ML EP SOLN
EPIDURAL | Status: DC | PRN
  Administered 2012-08-01: 1 mL

## 2012-08-01 MED ORDER — CLONIDINE HCL 0.2 MG PO TABS
0.2000 mg | ORAL_TABLET | Freq: Two times a day (BID) | ORAL | Status: DC
  Administered 2012-08-01 – 2012-08-05 (×8): 0.2 mg via ORAL
  Filled 2012-08-01 (×9): qty 1

## 2012-08-01 MED ORDER — ONDANSETRON HCL 4 MG/2ML IJ SOLN: 4.0000 mg | Freq: Four times a day (QID) | INTRAMUSCULAR | Status: DC | PRN

## 2012-08-01 MED ORDER — HYDROMORPHONE HCL PF 1 MG/ML IJ SOLN
0.2500 mg | INTRAMUSCULAR | Status: DC | PRN
  Administered 2012-08-01 (×2): 0.5 mg via INTRAVENOUS

## 2012-08-01 MED ORDER — LOSARTAN POTASSIUM-HCTZ 50-12.5 MG PO TABS: 1.0000 | ORAL_TABLET | Freq: Every day | ORAL | Status: DC

## 2012-08-01 MED ORDER — MORPHINE SULFATE 4 MG/ML IJ SOLN
INTRAMUSCULAR | Status: DC | PRN
  Administered 2012-08-01: 4 mg via INTRAVENOUS

## 2012-08-01 MED ORDER — METHOCARBAMOL 100 MG/ML IJ SOLN
500.0000 mg | Freq: Four times a day (QID) | INTRAVENOUS | Status: DC | PRN
  Administered 2012-08-01: 500 mg via INTRAVENOUS
  Filled 2012-08-01: qty 5

## 2012-08-01 MED ORDER — ACETAMINOPHEN 10 MG/ML IV SOLN
INTRAVENOUS | Status: AC
  Filled 2012-08-01: qty 100

## 2012-08-01 SURGICAL SUPPLY — 80 items
BANDAGE ELASTIC 4 VELCRO ST LF (GAUZE/BANDAGES/DRESSINGS) ×2 IMPLANT
BANDAGE ELASTIC 6 VELCRO ST LF (GAUZE/BANDAGES/DRESSINGS) ×1 IMPLANT
BANDAGE ESMARK 6X9 LF (GAUZE/BANDAGES/DRESSINGS) ×1 IMPLANT
BLADE SAG 18X100X1.27 (BLADE) ×2 IMPLANT
BLADE SAW SGTL 13.0X1.19X90.0M (BLADE) ×2 IMPLANT
BNDG CMPR 9X6 STRL LF SNTH (GAUZE/BANDAGES/DRESSINGS) ×1
BNDG CMPR MED 10X6 ELC LF (GAUZE/BANDAGES/DRESSINGS) ×3
BNDG COHESIVE 6X5 TAN STRL LF (GAUZE/BANDAGES/DRESSINGS) ×2 IMPLANT
BNDG ELASTIC 6X10 VLCR STRL LF (GAUZE/BANDAGES/DRESSINGS) ×6 IMPLANT
BNDG ESMARK 6X9 LF (GAUZE/BANDAGES/DRESSINGS) ×2
BOWL SMART MIX CTS (DISPOSABLE) ×2 IMPLANT
CEMENT BONE SIMPLEX SPEEDSET (Cement) ×2 IMPLANT
CLOTH BEACON ORANGE TIMEOUT ST (SAFETY) ×2 IMPLANT
COVER BACK TABLE 24X17X13 BIG (DRAPES) IMPLANT
COVER SURGICAL LIGHT HANDLE (MISCELLANEOUS) ×2 IMPLANT
CUFF TOURNIQUET SINGLE 34IN LL (TOURNIQUET CUFF) ×1 IMPLANT
CUFF TOURNIQUET SINGLE 44IN (TOURNIQUET CUFF) IMPLANT
DRAPE INCISE IOBAN 66X45 STRL (DRAPES) ×1 IMPLANT
DRAPE ORTHO SPLIT 77X108 STRL (DRAPES) ×6
DRAPE PROXIMA HALF (DRAPES) ×2 IMPLANT
DRAPE SURG ORHT 6 SPLT 77X108 (DRAPES) ×2 IMPLANT
DRAPE U-SHAPE 47X51 STRL (DRAPES) ×2 IMPLANT
DRAPE X-RAY CASS 24X20 (DRAPES) IMPLANT
DRSG PAD ABDOMINAL 8X10 ST (GAUZE/BANDAGES/DRESSINGS) ×4 IMPLANT
DURAPREP 26ML APPLICATOR (WOUND CARE) ×2 IMPLANT
ELECT REM PT RETURN 9FT ADLT (ELECTROSURGICAL) ×2
ELECTRODE REM PT RTRN 9FT ADLT (ELECTROSURGICAL) ×1 IMPLANT
EVACUATOR 1/8 PVC DRAIN (DRAIN) ×2 IMPLANT
FACESHIELD LNG OPTICON STERILE (SAFETY) ×5 IMPLANT
GAUZE XEROFORM 5X9 LF (GAUZE/BANDAGES/DRESSINGS) ×2 IMPLANT
GLOVE BIO SURGEON ST LM GN SZ9 (GLOVE) ×2 IMPLANT
GLOVE BIOGEL PI IND STRL 8 (GLOVE) ×1 IMPLANT
GLOVE BIOGEL PI INDICATOR 8 (GLOVE) ×1
GLOVE SURG ORTHO 8.0 STRL STRW (GLOVE) ×2 IMPLANT
GOWN PREVENTION PLUS LG XLONG (DISPOSABLE) IMPLANT
GOWN PREVENTION PLUS XLARGE (GOWN DISPOSABLE) ×2 IMPLANT
GOWN STRL NON-REIN LRG LVL3 (GOWN DISPOSABLE) ×6 IMPLANT
HANDPIECE INTERPULSE COAX TIP (DISPOSABLE) ×2
HOOD PEEL AWAY FACE SHEILD DIS (HOOD) ×7 IMPLANT
IMMOBILIZER KNEE 20 (SOFTGOODS)
IMMOBILIZER KNEE 20 THIGH 36 (SOFTGOODS) IMPLANT
IMMOBILIZER KNEE 22 UNIV (SOFTGOODS) ×1 IMPLANT
IMMOBILIZER KNEE 24 THIGH 36 (MISCELLANEOUS) IMPLANT
IMMOBILIZER KNEE 24 UNIV (MISCELLANEOUS)
KIT BASIN OR (CUSTOM PROCEDURE TRAY) ×2 IMPLANT
KIT ROOM TURNOVER OR (KITS) ×2 IMPLANT
MANIFOLD NEPTUNE II (INSTRUMENTS) ×3 IMPLANT
MARKER SPHERE PSV REFLC THRD 5 (MARKER) ×6 IMPLANT
NDL 18GX1X1/2 (RX/OR ONLY) (NEEDLE) ×1 IMPLANT
NDL HYPO 18GX1.5 BLUNT FILL (NEEDLE) IMPLANT
NDL SPNL 18GX3.5 QUINCKE PK (NEEDLE) ×1 IMPLANT
NEEDLE 18GX1X1/2 (RX/OR ONLY) (NEEDLE) ×2 IMPLANT
NEEDLE HYPO 18GX1.5 BLUNT FILL (NEEDLE) ×2 IMPLANT
NEEDLE SPNL 18GX3.5 QUINCKE PK (NEEDLE) ×2 IMPLANT
NS IRRIG 1000ML POUR BTL (IV SOLUTION) ×3 IMPLANT
PACK TOTAL JOINT (CUSTOM PROCEDURE TRAY) ×2 IMPLANT
PAD ARMBOARD 7.5X6 YLW CONV (MISCELLANEOUS) ×4 IMPLANT
PAD CAST 4YDX4 CTTN HI CHSV (CAST SUPPLIES) ×1 IMPLANT
PADDING CAST COTTON 4X4 STRL (CAST SUPPLIES) ×2
PADDING CAST COTTON 6X4 STRL (CAST SUPPLIES) ×4 IMPLANT
PIN SCHANZ 4MM 130MM (PIN) ×8 IMPLANT
RUBBERBAND STERILE (MISCELLANEOUS) ×2 IMPLANT
SET HNDPC FAN SPRY TIP SCT (DISPOSABLE) ×1 IMPLANT
SPONGE GAUZE 4X4 12PLY (GAUZE/BANDAGES/DRESSINGS) ×2 IMPLANT
SPONGE LAP 18X18 X RAY DECT (DISPOSABLE) IMPLANT
STAPLER VISISTAT 35W (STAPLE) ×2 IMPLANT
SUCTION FRAZIER TIP 10 FR DISP (SUCTIONS) ×2 IMPLANT
SUT ETHILON 3 0 PS 1 (SUTURE) ×4 IMPLANT
SUT VIC AB 0 CTB1 27 (SUTURE) ×6 IMPLANT
SUT VIC AB 1 CT1 27 (SUTURE) ×10
SUT VIC AB 1 CT1 27XBRD ANBCTR (SUTURE) ×5 IMPLANT
SUT VIC AB 2-0 CT1 27 (SUTURE) ×4
SUT VIC AB 2-0 CT1 TAPERPNT 27 (SUTURE) ×2 IMPLANT
SYR 30ML SLIP (SYRINGE) ×2 IMPLANT
SYR 3ML LL SCALE MARK (SYRINGE) ×1 IMPLANT
SYR TB 1ML LUER SLIP (SYRINGE) ×2 IMPLANT
TOWEL OR 17X24 6PK STRL BLUE (TOWEL DISPOSABLE) ×2 IMPLANT
TOWEL OR 17X26 10 PK STRL BLUE (TOWEL DISPOSABLE) ×4 IMPLANT
TRAY FOLEY CATH 14FR (SET/KITS/TRAYS/PACK) ×2 IMPLANT
WATER STERILE IRR 1000ML POUR (IV SOLUTION) ×4 IMPLANT

## 2012-08-01 NOTE — Anesthesia Preprocedure Evaluation (Signed)
Anesthesia Evaluation  Patient identified by MRN, date of birth, ID band Patient awake    Reviewed: Allergy & Precautions, H&P , NPO status , Patient's Chart, lab work & pertinent test results  Airway Mallampati: I TM Distance: >3 FB Neck ROM: Full    Dental   Pulmonary          Cardiovascular hypertension, Pt. on medications     Neuro/Psych    GI/Hepatic GERD-  Medicated and Controlled,  Endo/Other  diabetes, Well Controlled, Type 2, Oral Hypoglycemic Agents  Renal/GU      Musculoskeletal   Abdominal   Peds  Hematology   Anesthesia Other Findings   Reproductive/Obstetrics                           Anesthesia Physical Anesthesia Plan  ASA: II  Anesthesia Plan: General   Post-op Pain Management:    Induction: Intravenous  Airway Management Planned: LMA  Additional Equipment:   Intra-op Plan:   Post-operative Plan: Extubation in OR  Informed Consent: I have reviewed the patients History and Physical, chart, labs and discussed the procedure including the risks, benefits and alternatives for the proposed anesthesia with the patient or authorized representative who has indicated his/her understanding and acceptance.     Plan Discussed with: CRNA and Surgeon  Anesthesia Plan Comments:         Anesthesia Quick Evaluation  

## 2012-08-01 NOTE — Brief Op Note (Signed)
08/01/2012  3:54 PM  PATIENT:  [name]  66 y.o. female  PRE-OPERATIVE DIAGNOSIS:  Right knee osteoarthritis  POST-OPERATIVE DIAGNOSIS:  Right knee osteoarthritis  PROCEDURE:  Procedure(s): TOTAL KNEE ARTHROPLASTY  SURGEON:  Surgeon(s): Cammy Copa, MD  ASSISTANT: s vernon  ANESTHESIA:   general  EBL: 100 ml    Total I/O In: 1600 [I.V.:1600] Out: 500 [Urine:500]  BLOOD ADMINISTERED: none  DRAINS: none   LOCAL MEDICATIONS USED:  none  SPECIMEN:  No Specimen  COUNTS:  YES  TOURNIQUET:  1 hour 36 min at 300  DICTATION: .Other Dictation: Dictation Number [id number]  PLAN OF CARE: Admit to inpatient   PATIENT DISPOSITION:  PACU - hemodynamically stable

## 2012-08-01 NOTE — Anesthesia Procedure Notes (Signed)
Procedure Name: LMA Insertion Date/Time: 08/01/2012 1:00 PM Performed by: Gayla Medicus Pre-anesthesia Checklist: Patient identified, Timeout performed, Emergency Drugs available, Suction available and Patient being monitored Patient Re-evaluated:Patient Re-evaluated prior to inductionOxygen Delivery Method: Circle system utilized Preoxygenation: Pre-oxygenation with 100% oxygen Intubation Type: IV induction LMA: LMA with gastric port inserted LMA Size: 4.0 Number of attempts: 1 Placement Confirmation: positive ETCO2 and breath sounds checked- equal and bilateral Tube secured with: Tape Dental Injury: Teeth and Oropharynx as per pre-operative assessment

## 2012-08-01 NOTE — Preoperative (Signed)
Beta Blockers   Reason not to administer Beta Blockers:Not Applicable 

## 2012-08-01 NOTE — Progress Notes (Signed)
Orthopedic Tech Progress Note Patient Details:  Heyli Min 1946-03-31 [id number]  CPM Right Knee CPM Right Knee: On Right Knee Flexion (Degrees): 30  Right Knee Extension (Degrees): 0  Additional Comments: applied overhead frame   Jennye Moccasin 08/01/2012, 4:29 PM

## 2012-08-01 NOTE — H&P (Signed)
TOTAL KNEE ADMISSION H&P  Patient is being admitted for right total knee arthroplasty.  Subjective:  Chief Complaint:right knee pain.  HPI: [name], 66 y.o. female, has a history of pain and functional disability in the right knee due to arthritis and has failed non-surgical conservative treatments for greater than 12 weeks to includeNSAID's and/or analgesics, corticosteriod injections, flexibility and strengthening excercises, use of assistive devices and activity modification.  Onset of symptoms was gradual, starting 8 years ago with gradually worsening course since that time. The patient noted no past surgery on the right knee(s).  Patient currently rates pain in the right knee(s) at 8 out of 10 with activity. Patient has night pain, worsening of pain with activity and weight bearing, pain that interferes with activities of daily living, pain with passive range of motion, crepitus and joint swelling.  Patient has evidence of subchondral sclerosis, periarticular osteophytes and joint space narrowing by imaging studies. This patient has had much pain . There is no active infection.  There are no active problems to display for this patient.  Past Medical History  Diagnosis Date  . Hypertension   . Normal cardiac stress test     pt can't remember when or where  . Diabetes mellitus without complication     Type 2 NIDDM x 15 years  . GERD (gastroesophageal reflux disease)   . Arthritis     osteoarthritis    Past Surgical History  Procedure Date  . Joint replacement     left knee  . Abdominal hysterectomy   . Tonsillectomy   . Nasal sinus surgery     Prescriptions prior to admission  Medication Sig Dispense Refill  . amLODipine (NORVASC) 10 MG tablet Take 10 mg by mouth daily.      Marland Kitchen aspirin EC 81 MG tablet Take 81 mg by mouth daily.      Marland Kitchen atorvastatin (LIPITOR) 40 MG tablet Take 40 mg by mouth daily.      . Calcium Carbonate-Vitamin D (CALCIUM 600 + D PO) Take 1 tablet by mouth  2 (two) times daily.      . cloNIDine (CATAPRES) 0.2 MG tablet Take 0.2 mg by mouth 2 (two) times daily.      Marland Kitchen estradiol (ESTRACE) 0.5 MG tablet Take 0.5 mg by mouth 2 (two) times daily.      . fish oil-omega-3 fatty acids 1000 MG capsule Take 1 g by mouth daily.      Marland Kitchen glipiZIDE (GLUCOTROL) 5 MG tablet Take 5 mg by mouth daily.      Marland Kitchen losartan-hydrochlorothiazide (HYZAAR) 50-12.5 MG per tablet Take 1 tablet by mouth daily.      . metFORMIN (GLUCOPHAGE) 1000 MG tablet Take 1,000 mg by mouth 2 (two) times daily with a meal.      . metoprolol tartrate (LOPRESSOR) 25 MG tablet Take 12.5 mg by mouth 2 (two) times daily.      . Multiple Vitamins-Minerals (MULTIVITAMINS THER. W/MINERALS) TABS Take 1 tablet by mouth daily.      Marland Kitchen omeprazole (PRILOSEC) 20 MG capsule Take 20 mg by mouth daily.       Allergies  Allergen Reactions  . Monopril (Fosinopril) Cough    History  Substance Use Topics  . Smoking status: Current Some Day Smoker    Types: Cigarettes  . Smokeless tobacco: Never Used     Comment: quit smoking regularly in her 30's; now has an occasional cigarette  . Alcohol Use: Yes     Comment: occasional  No family history on file.   Review of Systems  Constitutional: Negative.   HENT: Negative.   Eyes: Negative.   Respiratory: Negative.   Cardiovascular: Negative.   Gastrointestinal: Negative.   Genitourinary: Negative.   Musculoskeletal: Positive for joint pain.  Skin: Negative.   Neurological: Negative.   Endo/Heme/Allergies: Negative.   Psychiatric/Behavioral: Negative.     Objective:  Physical Exam  Constitutional: She appears well-developed.  HENT:  Head: Normocephalic.  Eyes: Pupils are equal, round, and reactive to light.  Neck: Normal range of motion.  Cardiovascular: Normal rate.   Respiratory: Effort normal.  GI: Soft.  Neurological: She is alert.  Skin: Skin is warm.  Psychiatric: She has a normal mood and affect.  mild varus - 5 - 110 rom    / dp 2 /  4 -  / collaterals stable med and lat joint line tenderness  Vital signs in last 24 hours: Temp:  [98.3 F (36.8 C)] 98.3 F (36.8 C) (12/10 0942) Pulse Rate:  [77] 77  (12/10 0942) Resp:  [16] 16  (12/10 0942) BP: (149)/(80) 149/80 mmHg (12/10 0942) SpO2:  [95 %] 95 % (12/10 0942)  Labs:   There is no height or weight on file to calculate BMI.   Imaging Review Plain radiographs demonstrate severe degenerative joint disease of the right knee(s). The overall alignment ismild varus. The bone quality appears to be good for age and reported activity level.  Assessment/Plan:  End stage arthritis, right knee   The patient history, physical examination, clinical judgment of the provider and imaging studies are consistent with end stage degenerative joint disease of the right knee(s) and total knee arthroplasty is deemed medically necessary. The treatment options including medical management, injection therapy arthroscopy and arthroplasty were discussed at length. The risks and benefits of total knee arthroplasty were presented and reviewed. The risks due to aseptic loosening, infection, stiffness, patella tracking problems, thromboembolic complications and other imponderables were discussed. The patient acknowledged the explanation, agreed to proceed with the plan and consent was signed. Patient is being admitted for inpatient treatment for surgery, pain control, PT, OT, prophylactic antibiotics, VTE prophylaxis, progressive ambulation and ADL's and discharge planning. The patient is planning to be discharged home with home health services

## 2012-08-01 NOTE — Plan of Care (Signed)
Problem: Consults Goal: Diagnosis- Total Joint Replacement Primary Total Knee Right     

## 2012-08-01 NOTE — Transfer of Care (Signed)
Immediate Anesthesia Transfer of Care Note  Patient: [name]  Procedure(s) Performed: Procedure(s) (LRB) with comments: TOTAL KNEE ARTHROPLASTY (Right) - Right total knee arthroplasty  Patient Location: PACU  Anesthesia Type:General and Regional  Level of Consciousness: awake, alert , oriented and patient cooperative  Airway & Oxygen Therapy: Patient Spontanous Breathing and Patient connected to nasal cannula oxygen  Post-op Assessment: Report given to PACU RN, Post -op Vital signs reviewed and stable and Patient moving all extremities X 4  Post vital signs: Reviewed and stable  Complications: No apparent anesthesia complications

## 2012-08-01 NOTE — Progress Notes (Signed)
UR COMPLETED  

## 2012-08-01 NOTE — Progress Notes (Signed)
ANTICOAGULATION CONSULT NOTE - Initial Consult  Pharmacy Consult for Coumadin Indication: VTE prophylaxis  Allergies  Allergen Reactions  . Monopril (Fosinopril) Cough    Patient Measurements:   Vital Signs: Temp: 98.6 F (37 C) (12/10 1721) Temp src: Oral (12/10 0942) BP: 133/74 mmHg (12/10 1721) Pulse Rate: 78  (12/10 1721)  Labs:  Basename 08/01/12 1815  HGB --  HCT --  PLT --  APTT --  LABPROT 13.0  INR 0.99  HEPARINUNFRC --  CREATININE --  CKTOTAL --  CKMB --  TROPONINI --    CrCl is unknown because there is no height on file for the current visit.   Medical History: Past Medical History  Diagnosis Date  . Hypertension   . Normal cardiac stress test     pt can't remember when or where  . Diabetes mellitus without complication     Type 2 NIDDM x 15 years  . GERD (gastroesophageal reflux disease)   . Arthritis     osteoarthritis    Medications:  Scheduled:    . amLODipine  10 mg Oral Daily  . atorvastatin  40 mg Oral q1800  .  ceFAZolin (ANCEF) IV  1 g Intravenous Q8H  . [COMPLETED]  ceFAZolin (ANCEF) IV  2 g Intravenous 60 min Pre-Op  . celecoxib  200 mg Oral Q12H  . cloNIDine  0.2 mg Oral BID  . docusate sodium  100 mg Oral BID  . glipiZIDE  5 mg Oral Q breakfast  . losartan  50 mg Oral Daily   And  . hydrochlorothiazide  12.5 mg Oral Daily  . insulin aspart  0-15 Units Subcutaneous TID WC  . metFORMIN  1,000 mg Oral BID WC  . metoprolol tartrate  12.5 mg Oral BID  . morphine   Intravenous Q4H  . multivitamin with minerals  1 tablet Oral Daily  . pantoprazole  40 mg Oral Daily  . pneumococcal 23 valent vaccine  0.5 mL Intramuscular Tomorrow-1000  . [DISCONTINUED] chlorhexidine  60 mL Topical Once  . [DISCONTINUED] chlorhexidine  60 mL Topical Once  . [DISCONTINUED] cloNIDine  150 mcg Intra-articular To OR  . [DISCONTINUED] fentaNYL  100 mcg Intravenous Once  . [DISCONTINUED] losartan-hydrochlorothiazide  1 tablet Oral Daily  .  [DISCONTINUED] multivitamins ther. w/minerals  1 tablet Oral Daily    Assessment: 66 yr old female on coumadin for VTE prophylaxis s/p TKA.   Goal of Therapy:  INR 2-3 Monitor platelets by anticoagulation protocol: Yes   Plan:  Coumadin 5mg  po x 1 dose tonight. Daily PT/INR.  Wendie Simmer, PharmD, BCPS Clinical Pharmacist  Pager: [phone]

## 2012-08-02 ENCOUNTER — Encounter (HOSPITAL_COMMUNITY): Payer: Self-pay | Admitting: Orthopedic Surgery

## 2012-08-02 LAB — BASIC METABOLIC PANEL
BUN: 11 mg/dL (ref 6–23)
Calcium: 8.6 mg/dL (ref 8.4–10.5)
Creatinine, Ser: 0.8 mg/dL (ref 0.50–1.10)
GFR calc Af Amer: 86 mL/min — ABNORMAL LOW (ref >90)
GFR calc non Af Amer: 74 mL/min — ABNORMAL LOW (ref >90)
Potassium: 3.8 mEq/L (ref 3.5–5.1)

## 2012-08-02 LAB — CBC
HCT: 29.3 % — ABNORMAL LOW (ref 36.0–46.0)
MCH: 29.5 pg (ref 26.0–34.0)
MCHC: 32.4 g/dL (ref 30.0–36.0)
MCV: 91 fL (ref 78.0–100.0)
RDW: 14.8 % (ref 11.5–15.5)

## 2012-08-02 LAB — PROTIME-INR: INR: 1.15 (ref 0.00–1.49)

## 2012-08-02 LAB — GLUCOSE, CAPILLARY
Glucose-Capillary: 125 mg/dL — ABNORMAL HIGH (ref 70–99)
Glucose-Capillary: 74 mg/dL (ref 70–99)

## 2012-08-02 MED ORDER — WARFARIN SODIUM 5 MG PO TABS
5.0000 mg | ORAL_TABLET | Freq: Once | ORAL | Status: AC
  Administered 2012-08-02: 5 mg via ORAL
  Filled 2012-08-02: qty 1

## 2012-08-02 MED ORDER — WARFARIN VIDEO: Freq: Once | Status: DC

## 2012-08-02 NOTE — Evaluation (Signed)
Occupational Therapy Evaluation Patient Details Name: [name] MRN: [id number] DOB: 1946-06-27 Today's Date: 08/02/2012 Time: 4098-1191 OT Time Calculation (min): 30 min  OT Assessment / Plan / Recommendation Clinical Impression  Pt admitted for R TKA with deficits listed below.  Pt will only need acute OT to address LE adls and shower transfers so she can d/c home with assist from her family.    OT Assessment  Patient needs continued OT Services    Follow Up Recommendations  No OT follow up    Barriers to Discharge None    Equipment Recommendations       Recommendations for Other Services    Frequency  Min 2X/week    Precautions / Restrictions Precautions Precautions: Knee Required Braces or Orthoses: Knee Immobilizer - Right Restrictions Weight Bearing Restrictions: Yes RUE Weight Bearing: Weight bearing as tolerated RLE Weight Bearing: Weight bearing as tolerated LLE Weight Bearing: Weight bearing as tolerated   Pertinent Vitals/Pain Pt with pain level of 4/10.    ADL  Eating/Feeding: Performed;Independent Where Assessed - Eating/Feeding: Chair Grooming: Performed;Wash/dry face;Wash/dry hands;Supervision/safety Where Assessed - Grooming: Supported standing Upper Body Bathing: Simulated;Set up Where Assessed - Upper Body Bathing: Supported sitting Lower Body Bathing: Minimal assistance;Performed Where Assessed - Lower Body Bathing: Supported sit to stand Upper Body Dressing: Simulated;Set up Where Assessed - Upper Body Dressing: Supported sitting Lower Body Dressing: Simulated;Moderate assistance Where Assessed - Lower Body Dressing: Supported sit to stand Toilet Transfer: Performed;Minimal Dentist Method: Sit to stand;Stand pivot Acupuncturist: Bedside commode Toileting - Clothing Manipulation and Hygiene: Performed;Min guard Where Assessed - Engineer, mining and Hygiene: Standing Equipment Used: Rolling  walker Transfers/Ambulation Related to ADLs: min assist to min guard for all transfers.  Mod A with L E adls due to KI but pt does have assist at home. ADL Comments: Pt needs more assist with LE adls.  none with UE adls.  Has assist at home.  All education for techniques for adls complete.    OT Diagnosis: Generalized weakness;Acute pain  OT Problem List: Decreased knowledge of use of DME or AE;Pain OT Treatment Interventions:     OT Goals Acute Rehab OT Goals OT Goal Formulation: With patient Time For Goal Achievement: 08/16/12 Potential to Achieve Goals: Good ADL Goals Pt Will Perform Grooming: with supervision;Standing at sink ADL Goal: Grooming - Progress: Goal set today Pt Will Perform Lower Body Bathing: with supervision;Sit to stand from chair;Sitting at sink;Standing at sink ADL Goal: Lower Body Bathing - Progress: Goal set today Pt Will Perform Lower Body Dressing: with min assist;Sit to stand from chair ADL Goal: Lower Body Dressing - Progress: Goal set today Pt Will Perform Tub/Shower Transfer: Shower transfer;with supervision;Shower seat without back ADL Goal: Web designer - Progress: Goal set today Additional ADL Goal #1: Pt will complete all aspects of toileting on 3:1 over commode with S. ADL Goal: Additional Goal #1 - Progress: Goal set today  Visit Information  Last OT Received On: 08/02/12 Assistance Needed: +1    Subjective Data  Subjective: "I feel pretty good." Patient Stated Goal: to go home with my family.   Prior Functioning     Home Living Lives With: Family Available Help at Discharge: Family;Available 24 hours/day Type of Home: Mobile home Home Access: Stairs to enter Entrance Stairs-Number of Steps: 5 Entrance Stairs-Rails: None Home Layout: One level Bathroom Shower/Tub: Walk-in shower;Door [employer] Toilet: Standard Home Adaptive Equipment: Bedside commode/3-in-1;Walker - rolling Prior Function Level of Independence: Needs  assistance  Needs Assistance: Gait Gait Assistance: used a walker  Able to Take Stairs?: Yes Driving: Yes Vocation: Full time employment Communication Communication: No difficulties Dominant Hand: Right         Vision/Perception Vision - Assessment Vision Assessment: Vision not tested   Cognition  Overall Cognitive Status: Appears within functional limits for tasks assessed/performed Arousal/Alertness: Awake/alert Orientation Level: Oriented X4 / Intact Behavior During Session: [facility] for tasks performed    Extremity/Trunk Assessment Right Upper Extremity Assessment RUE ROM/Strength/Tone: Within functional levels RUE Sensation: WFL - Light Touch RUE Coordination: WFL - gross/fine motor Left Upper Extremity Assessment LUE ROM/Strength/Tone: Deficits;Due to pain LUE ROM/Strength/Tone Deficits: L shoulder AROM limited by pain.  LUE Sensation: WFL - Light Touch LUE Coordination: WFL - gross/fine motor Trunk Assessment Trunk Assessment: Normal     Mobility Transfers Transfers: Sit to Stand;Stand to Sit Sit to Stand: 4: Min assist;From bed Stand to Sit: 4: Min assist;To chair/3-in-1 Details for Transfer Assistance: Cues for technique. Assist to steady pt.       Shoulder Instructions     Exercise     Balance Balance Balance Assessed: No   End of Session OT - End of Session Equipment Utilized During Treatment: Right knee immobilizer Activity Tolerance: Patient tolerated treatment well Patient left: in chair;with call bell/phone within reach Nurse Communication: Mobility status  GO     Hope Budds 08/02/2012, 12:42 PM [phone]

## 2012-08-02 NOTE — Anesthesia Postprocedure Evaluation (Signed)
Anesthesia Post Note  Patient: [name]  Procedure(s) Performed: Procedure(s) (LRB): TOTAL KNEE ARTHROPLASTY (Right)  Anesthesia type: general  Patient location: PACU  Post pain: Pain level controlled  Post assessment: Patient's Cardiovascular Status Stable  Last Vitals:  Filed Vitals:   08/02/12 1700  BP: 146/67  Pulse:   Temp:   Resp:     Post vital signs: Reviewed and stable  Level of consciousness: sedated  Complications: No apparent anesthesia complications

## 2012-08-02 NOTE — Progress Notes (Signed)
Subjective: Pt stable - pain controlled   Objective: Vital signs in last 24 hours: Temp:  [98.3 F (36.8 C)-99.5 F (37.5 C)] 99.5 F (37.5 C) (12/11 0519) Pulse Rate:  [63-98] 63  (12/11 0519) Resp:  [7-18] 14  (12/11 0519) BP: (110-164)/(47-80) 110/47 mmHg (12/11 0519) SpO2:  [93 %-100 %] 98 % (12/11 0519)  Intake/Output from previous day: 12/10 0701 - 12/11 0700 In: 2320 [P.O.:120; I.V.:2200] Out: 1750 [Urine:1750] Intake/Output this shift:    Exam:  Neurovascular intact Sensation intact distally Intact pulses distally Dorsiflexion/Plantar flexion intact  Labs:  Basename 08/02/12 0555  HGB 9.5*    Basename 08/02/12 0555  WBC 8.5  RBC 3.22*  HCT 29.3*  PLT 274   No results found for this basename: NA:2,K:2,CL:2,CO2:2,BUN:2,CREATININE:2,GLUCOSE:2,CALCIUM:2 in the last 72 hours  Basename 08/01/12 1815  LABPT --  INR 0.99    Assessment/Plan: CPM 2 hours per 8 - hl iv - oral pain meds - oob to chair   Holle Sprick SCOTT 08/02/2012, 7:21 AM

## 2012-08-02 NOTE — Evaluation (Signed)
Physical Therapy Evaluation Patient Details Name: [name] MRN: [id number] DOB: [birth year] Today's Date: 08/02/2012 Time: 4098-1191 PT Time Calculation (min): 48 min  PT Assessment / Plan / Recommendation Clinical Impression  Pt is a 66 y/o female s/p R TKA.  Pt will be followed by PT to progress mobility for d/c to home with HHPT.     PT Assessment  Patient needs continued PT services    Follow Up Recommendations  Home health PT;Supervision - Intermittent    Does the patient have the potential to tolerate intense rehabilitation      Barriers to Discharge None      Equipment Recommendations  None recommended by PT    Recommendations for Other Services     Frequency 7X/week    Precautions / Restrictions Precautions Precautions: Knee Restrictions Weight Bearing Restrictions: Yes RLE Weight Bearing: Weight bearing as tolerated   Pertinent Vitals/Pain Pt reporting pain in knee 4/10. PCA for pain control.  SpO2 dropped to high 80s on room air but quickly returned to mid 90s when pt cued to breath through her nose.        Mobility  Bed Mobility Bed Mobility: Supine to Sit;Sitting - Scoot to Edge of Bed Supine to Sit: 4: Min assist;HOB flat Sitting - Scoot to [place] of Bed: 4: Min assist Details for Bed Mobility Assistance: assist for R LE.  Transfers Transfers: Sit to Stand;Stand to Sit Sit to Stand: 4: Min assist;From bed Stand to Sit: 4: Min assist;To chair/3-in-1 Details for Transfer Assistance: Cues for technique. Assist to steady pt.   Ambulation/Gait Ambulation/Gait Assistance: 4: Min guard Ambulation Distance (Feet): 8 Feet Assistive device: Rolling walker Ambulation/Gait Assistance Details: Cues for gait sequencing.   Gait Pattern: Step-to pattern Stairs: No Wheelchair Mobility Wheelchair Mobility: No    Shoulder Instructions     Exercises Total Joint Exercises Ankle Circles/Pumps: AROM;Both;10 reps;Seated Quad Sets: AROM;Right;5 reps;Seated   PT  Diagnosis: Difficulty walking;Generalized weakness;Acute pain  PT Problem List: Decreased strength;Decreased range of motion;Decreased activity tolerance;Decreased mobility;Decreased knowledge of use of DME;Decreased knowledge of precautions;Pain PT Treatment Interventions: DME instruction;Gait training;Stair training;Therapeutic activities;Functional mobility training;Therapeutic exercise;Patient/family education   PT Goals Acute Rehab PT Goals PT Goal Formulation: With patient Potential to Achieve Goals: Good Pt will go Supine/Side to Sit: with supervision PT Goal: Supine/Side to Sit - Progress: Goal set today Pt will go Sit to Supine/Side: with supervision PT Goal: Sit to Supine/Side - Progress: Goal set today Pt will go Sit to Stand: with supervision PT Goal: Sit to Stand - Progress: Goal set today Pt will go Stand to Sit: with supervision PT Goal: Stand to Sit - Progress: Goal set today Pt will Ambulate: 51 - 150 feet;with supervision;with rolling walker PT Goal: Ambulate - Progress: Goal set today Pt will Go Up / Down Stairs: 3-5 stairs;with rolling walker;with min assist PT Goal: Up/Down Stairs - Progress: Goal set today Pt will Perform Home Exercise Program: with supervision, verbal cues required/provided PT Goal: Perform Home Exercise Program - Progress: Goal set today  Visit Information  Last PT Received On: 08/02/12    Subjective Data  Subjective: Agree to PT eval Patient Stated Goal: Walk without.    Prior Functioning  Home Living Lives With: Family Available Help at Discharge: Family;Available 24 hours/day Type of Home: Mobile home Home Access: Stairs to enter Entrance Stairs-Number of Steps: 5 Entrance Stairs-Rails: None Home Layout: One level Bathroom Shower/Tub: Walk-in shower;Door [employer] Toilet: Standard Home Adaptive Equipment: Bedside commode/3-in-1;Walker - rolling Prior Function  Level of Independence: Needs assistance Needs Assistance: Gait (Assist  for stairs. 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  Right Lower Extremity Assessment RLE ROM/Strength/Tone: Deficits;Due to pain RLE ROM/Strength/Tone Deficits: ROM and strength limited secondary to surgery.  Left Lower Extremity Assessment LLE ROM/Strength/Tone: WFL for tasks assessed Trunk Assessment Trunk Assessment: Normal   Balance Balance Balance Assessed: No  End of Session PT - End of Session Equipment Utilized During Treatment: Gait belt Activity Tolerance: Patient limited by pain;Other (comment) (c/o feeling nauseated in standing.  ) Patient left: in chair;with call bell/phone within reach Nurse Communication: Mobility status CPM Right Knee CPM Right Knee: Off  GP     Edell Mesenbrink 08/02/2012, 11:06 AM  Tinsley Lomas L. Jeena Arnett DPT [phone]

## 2012-08-02 NOTE — Progress Notes (Signed)
Physical Therapy Treatment Patient Details Name: [name] MRN: [id number] DOB: 04/29/1946 Today's Date: 08/02/2012 Time: 6213-0865 PT Time Calculation (min): 23 min  PT Assessment / Plan / Recommendation Comments on Treatment Session  Pt mobility and activity tolerance much improved.    Follow Up Recommendations  Home health PT;Supervision - Intermittent     Does the patient have the potential to tolerate intense rehabilitation     Barriers to Discharge        Equipment Recommendations  None recommended by PT    Recommendations for Other Services    Frequency 7X/week   Plan Discharge plan remains appropriate;Frequency remains appropriate    Precautions / Restrictions Precautions Precautions: Knee Required Braces or Orthoses: Knee Immobilizer - Right Restrictions Weight Bearing Restrictions: Yes RUE Weight Bearing: Weight bearing as tolerated RLE Weight Bearing: Weight bearing as tolerated LLE Weight Bearing: Weight bearing as tolerated   Pertinent Vitals/Pain 5/10 pain in knee.  Pt has PCA for pain relief.      Mobility  Bed Mobility Bed Mobility: Sit to Supine Sit to Supine: 4: Min guard;HOB flat Details for Bed Mobility Assistance: Instructed pt to pull on KI with her UEs to assist R LE lifting.  Transfers Transfers: Sit to Stand;Stand to Sit Sit to Stand: 4: Min guard;From chair/3-in-1;With upper extremity assist Stand to Sit: 4: Min assist;4: Min guard;To chair/3-in-1;To bed;With upper extremity assist Details for Transfer Assistance: Cues for hand placement assist to slide R foot forward first trial into chair, No assist for second trial onto bed.  Ambulation/Gait Ambulation/Gait Assistance: 5: Supervision Ambulation Distance (Feet): 50 Feet Assistive device: Rolling walker Ambulation/Gait Assistance Details: cues for gait sequencing. Multiple standing rest breaks secondary to UE fatigue. Cued pt to increase wb on LEs and decrease dependence on UEs.   Gait  Pattern: Step-to pattern;Decreased stride length;Decreased weight shift to right Stairs: No Wheelchair Mobility Wheelchair Mobility: No    Exercises     PT Diagnosis:    PT Problem List:   PT Treatment Interventions:     PT Goals Acute Rehab PT Goals PT Goal Formulation: With patient Time For Goal Achievement: 08/09/12 Potential to Achieve Goals: Good Pt will go Supine/Side to Sit: with supervision Pt will go Sit to Supine/Side: with supervision PT Goal: Sit to Supine/Side - Progress: Progressing toward goal Pt will go Sit to Stand: with supervision PT Goal: Sit to Stand - Progress: Progressing toward goal Pt will go Stand to Sit: with supervision PT Goal: Stand to Sit - Progress: Progressing toward goal Pt will Ambulate: 51 - 150 feet;with supervision;with rolling walker PT Goal: Ambulate - Progress: Progressing toward goal Pt will Go Up / Down Stairs: 3-5 stairs;with rolling walker;with min assist Pt will Perform Home Exercise Program: with supervision, verbal cues required/provided  Visit Information  Last PT Received On: 08/02/12 Assistance Needed: +1    Subjective Data  Subjective: I have been asking to go back to bed all day.   Patient Stated Goal: Walk without pain.    Cognition  Overall Cognitive Status: Appears within functional limits for tasks assessed/performed Arousal/Alertness: Awake/alert Orientation Level: Oriented X4 / Intact Behavior During Session: [facility] for tasks performed    Balance  Balance Balance Assessed: No  End of Session PT - End of Session Equipment Utilized During Treatment: Gait belt Activity Tolerance: Patient limited by fatigue Patient left: in bed;with call bell/phone within reach Nurse Communication: Mobility status   GP     Enda Santo 08/02/2012, 4:00 PM Primitivo Merkey L. Chilton Si  DPT [phone]

## 2012-08-02 NOTE — Op Note (Signed)
NAMELANAYA, BENNIS NO.:  [id number]  MEDICAL RECORD NO.:  [id number]  LOCATION:  5N14C                        FACILITY:  [facility]  PHYSICIAN:  Burnard Bunting, M.D.    DATE OF BIRTH:  09-25-1945  DATE OF PROCEDURE: DATE OF DISCHARGE:                              OPERATIVE REPORT   PREOPERATIVE DIAGNOSIS:  Right knee arthritis.  POSTOPERATIVE DIAGNOSIS:  Right knee arthritis.  PROCEDURE:  Right total knee replacement using a Stryker triathlon 3 femur, posterior cruciate sacrificing, 3 tibia, 13 poly insert, 29 offset polyethylene patella.  SURGEON:  Burnard Bunting, M.D.  ASSISTANT:  Wende Neighbors, PA.  ANESTHESIA:  General endotracheal.  ESTIMATED BLOOD LOSS:  100 mL.  DRAINS:  None.  TOURNIQUET TIME:  One hour 36 minutes at 300 mmHg.  INDICATIONS:  Frenchie Pribyl is a patient with end-stage right knee arthritis, presents for operative management after explanation of risks and benefits.  PROCEDURE IN DETAIL:  The patient was brought to the operating room, where general endotracheal anesthesia was induced.  Preoperative IV antibiotics administered.  Time-out was called.  Right leg was then prescrubbed with alcohol and Betadine, which allowed to air dry. Prepped with DuraPrep solution and draped in sterile manner.  Collier Flowers is used to cover the operative field.  Leg was elevated and exsanguinated with Esmarch wrap.  Tourniquet was inflated.  Anterior approach, was made skin and subcutaneous tissue sharply divided.  Median parapatellar approach was made to the knee.  Precise location marked with #1 Vicryl suture.  Soft tissue elevated anterior distal femur.  Lateral patellofemoral ligament was released.  Fat pad partially excised.  At this time using intramedullary alignment, a cut was made off the tibia, which was 9 mm off the least affected lateral side.  In a similar fashion, a 10 mm cut was made off of the distal femur in neutral alignment using  intramedullary alignment as well.  This gave full extension with a 9 mm spacer.  At this time, box cut __________ were made.  Tibia was keel punched.  Trial reduction was performed.  The patient had good and full extension with an 11 degrees spacer in good stability to varus and valgus stress.  Patella was then cut from 20-12 and 3-peg patella was trialed.  The patient again maintained full extension, full flexion without lift-off, excellent patellar tracking with an 11 spacer.  Trial components were removed, 3 L of irrigating solution, used irrigated the bony surfaces.  True components were then cemented into position.  I did use a 13 polyethylene insert just for increased stability in varus and valgus stress at 0 degrees.  The patient had excellent patellar tracking.  At this time, tourniquet was released.  Bleeding points were encountered, controlled with electrocautery.  The median parapatellar approach was closed over bolster using interrupted inverted #1 Vicryl suture, followed by 3-0 Vicryl suture, Vicryl suture and skin staples.  The patient tolerated the procedure well without immediate complications.  The patient was placed in a knee immobilizer.  Velna Hatchet Vernon's assistance was required at all times during the case of retraction of neurovascular structures, opening and closing her assistance was a medical  necessity.     Burnard Bunting, M.D.     GSD/MEDQ  D:  08/01/2012  T:  08/02/2012  Job:  [id number]

## 2012-08-02 NOTE — Progress Notes (Signed)
ANTICOAGULATION CONSULT NOTE - Follow Up Consult  Pharmacy Consult for Coumadin Indication: VTE prophylaxis  Allergies  Allergen Reactions  . Monopril (Fosinopril) Cough   Vital Signs: Temp: 99.5 F (37.5 C) (12/11 0519) Temp src: Oral (12/11 0519) BP: 110/47 mmHg (12/11 0519) Pulse Rate: 63  (12/11 0519)  Labs:  Basename 08/02/12 0555 08/01/12 1815  HGB 9.5* --  HCT 29.3* --  PLT 274 --  APTT -- --  LABPROT 14.5 13.0  INR 1.15 0.99  HEPARINUNFRC -- --  CREATININE 0.80 --  CKTOTAL -- --  CKMB -- --  TROPONINI -- --   Assessment: 66 yo female on coumadin for VTE prophylaxis s/p TKA on 08/01/12. CBC consistent with post-op anemia. INR 1.15 today. No overt bleeding noted in chart.  Goal of Therapy:  INR 2-3 Monitor platelets by anticoagulation protocol: Yes   Plan:  1) Repeat Coumadin 5mg  x 1 tonight 2) F/u INR & CBC in AM 3) Monitor for signs/symptoms of bleeding  Benjaman Pott, PharmD    08/02/2012   8:59 AM

## 2012-08-03 ENCOUNTER — Encounter (HOSPITAL_COMMUNITY): Payer: Self-pay | Admitting: General Practice

## 2012-08-03 LAB — CBC
Hemoglobin: 9 g/dL — ABNORMAL LOW (ref 12.0–15.0)
MCHC: 32.6 g/dL (ref 30.0–36.0)
RBC: 3 MIL/uL — ABNORMAL LOW (ref 3.87–5.11)
WBC: 8.1 10*3/uL (ref 4.0–10.5)

## 2012-08-03 LAB — PROTIME-INR
INR: 1.42 (ref 0.00–1.49)
Prothrombin Time: 17 seconds — ABNORMAL HIGH (ref 11.6–15.2)

## 2012-08-03 LAB — GLUCOSE, CAPILLARY
Glucose-Capillary: 120 mg/dL — ABNORMAL HIGH (ref 70–99)
Glucose-Capillary: 120 mg/dL — ABNORMAL HIGH (ref 70–99)
Glucose-Capillary: 157 mg/dL — ABNORMAL HIGH (ref 70–99)

## 2012-08-03 MED ORDER — WARFARIN SODIUM 5 MG PO TABS
5.0000 mg | ORAL_TABLET | Freq: Once | ORAL | Status: AC
  Administered 2012-08-03: 5 mg via ORAL
  Filled 2012-08-03: qty 1

## 2012-08-03 NOTE — Progress Notes (Signed)
Physical Therapy Treatment Patient Details Name: [name] MRN: [id number] DOB: 1946/02/01 Today's Date: 08/03/2012 Time: 1700-1746 PT Time Calculation (min): 46 min  PT Assessment / Plan / Recommendation Comments on Treatment Session  Pt has met most of her acute PT goals and should be appropriate to d/c home tomorrow barring setback.      Follow Up Recommendations  Home health PT;Supervision - Intermittent     Equipment Recommendations  None recommended by PT    Recommendations for Other Services    Frequency 7X/week   Plan Discharge plan remains appropriate;Frequency remains appropriate    Precautions / Restrictions Precautions Precautions: Knee Required Braces or Orthoses: Knee Immobilizer - Right Restrictions Weight Bearing Restrictions: Yes RUE Weight Bearing: Weight bearing as tolerated RLE Weight Bearing: Weight bearing as tolerated LLE Weight Bearing: Weight bearing as tolerated   Pertinent Vitals/Pain 3/10 pain in knee.  RN notified.      Mobility  Bed Mobility Bed Mobility: Sit to Supine Sit to Supine: 6: Modified independent (Device/Increase time) Details for Bed Mobility Assistance: No assistance required.  Transfers Transfers: Sit to Stand;Stand to Sit;Stand Pivot Transfers Sit to Stand: 6: Modified independent (Device/Increase time) Stand to Sit: 6: Modified independent (Device/Increase time) Stand Pivot Transfers: 6: Modified independent (Device/Increase time) Ambulation/Gait Ambulation/Gait Assistance: 5: Supervision Ambulation Distance (Feet): 100 Feet Assistive device: Rolling walker Ambulation/Gait Assistance Details: Cues to increase gait speed and cues for reciprocal gait.  Gait Pattern: Step-through pattern;Decreased stance time - left;Decreased step length - right;Trunk flexed Gait velocity: 60 ft/49 sec =1.45f t/sec Stairs: Yes Stairs Assistance: 4: Min assist Stairs Assistance Details (indicate cue type and reason): assist to manage and  steady walker.  Stair Management Technique: Backwards;With walker Number of Stairs: 4  Wheelchair Mobility Wheelchair Mobility: No    Exercises Total Joint Exercises Ankle Circles/Pumps: AROM;Both;10 reps;Supine Quad Sets: AROM;Strengthening;10 reps;Supine;Right Heel Slides: AAROM;Strengthening;10 reps;Supine;Right Straight Leg Raises: AROM;5 reps;Right   PT Diagnosis:    PT Problem List:   PT Treatment Interventions:     PT Goals Acute Rehab PT Goals PT Goal Formulation: With patient Time For Goal Achievement: 08/09/12 Potential to Achieve Goals: Good Pt will go Supine/Side to Sit: with supervision PT Goal: Supine/Side to Sit - Progress: Met Pt will go Sit to Supine/Side: with supervision PT Goal: Sit to Supine/Side - Progress: Met Pt will go Sit to Stand: with supervision PT Goal: Sit to Stand - Progress: Met Pt will go Stand to Sit: with supervision PT Goal: Stand to Sit - Progress: Met Pt will Ambulate: 51 - 150 feet;with supervision;with rolling walker PT Goal: Ambulate - Progress: Met Pt will Go Up / Down Stairs: 3-5 stairs;with rolling walker;with min assist PT Goal: Up/Down Stairs - Progress: Met Pt will Perform Home Exercise Program: with supervision, verbal cues required/provided PT Goal: Perform Home Exercise Program - Progress: Progressing toward goal  Visit Information  Last PT Received On: 08/03/12    Subjective Data  Subjective: No new complaints and agreeable to therapy today. Reports knee pain as 4/10 currently. Patient Stated Goal: Walk without pain.    Cognition  Overall Cognitive Status: Appears within functional limits for tasks assessed/performed Arousal/Alertness: Awake/alert Orientation Level: Appears intact for tasks assessed Behavior During Session: [facility] for tasks performed    Balance     End of Session PT - End of Session Equipment Utilized During Treatment: Gait belt Activity Tolerance: Patient tolerated treatment well Patient left: in  bed;with call bell/phone within reach;in CPM;with family/visitor present Nurse Communication: Mobility  status   GP     Jeromie Gainor 08/03/2012, 6:39 PM Santiago Graf L. Apryl Brymer DPT [phone]

## 2012-08-03 NOTE — Progress Notes (Signed)
Contacted Dr. August Saucer about PCA pump. Instructed to discontinue.

## 2012-08-03 NOTE — Progress Notes (Signed)
ANTICOAGULATION CONSULT NOTE - Follow Up Consult  Pharmacy Consult for Coumadin Indication: VTE prophylaxis  Allergies  Allergen Reactions  . Monopril (Fosinopril) Cough   Vital Signs: Temp: 98.6 F (37 C) (12/12 0700) BP: 137/53 mmHg (12/12 0700) Pulse Rate: 79  (12/12 0700)  Labs:  Basename 08/03/12 0645 08/02/12 0555 08/01/12 1815  HGB 9.0* 9.5* --  HCT 27.6* 29.3* --  PLT 189 274 --  APTT -- -- --  LABPROT 17.0* 14.5 13.0  INR 1.42 1.15 0.99  HEPARINUNFRC -- -- --  CREATININE -- 0.80 --  CKTOTAL -- -- --  CKMB -- -- --  TROPONINI -- -- --   Assessment: 66 yo female on coumadin for VTE prophylaxis s/p R TKA on 08/01/12. CBC consistent with post-op anemia, slightly low but stable. INR 1.42 today. No overt bleeding noted in chart.  Goal of Therapy:  INR 2-3 Monitor platelets by anticoagulation protocol: Yes   Plan:  1) Repeat Coumadin 5mg  x 1 tonight 2) F/u INR & CBC in AM 3) Monitor for signs/symptoms of bleeding  Leota Sauers Pharm.D. CPP, BCPS Clinical Pharmacist [phone] 08/03/2012 9:25 AM

## 2012-08-03 NOTE — Progress Notes (Signed)
Physical Therapy Treatment Patient Details Name: [name] MRN: [id number] DOB: [birth year] Today's Date: 08/03/2012 Time: 5784-6962 PT Time Calculation (min): 31 min  PT Assessment / Plan / Recommendation Comments on Treatment Session  Pt making great progres toward goals today with increased activity and less assitance needed with mobiltiy.    Follow Up Recommendations  Home health PT;Supervision - Intermittent           Equipment Recommendations  None recommended by PT       Frequency 7X/week   Plan Discharge plan remains appropriate;Frequency remains appropriate    Precautions / Restrictions Precautions Precautions: Knee Required Braces or Orthoses: Knee Immobilizer - Right Restrictions Weight Bearing Restrictions: Yes RUE Weight Bearing: Weight bearing as tolerated RLE Weight Bearing: Weight bearing as tolerated       Mobility  Bed Mobility Supine to Sit: 4: Min guard;HOB elevated (HOB 30 degrees elevated) Sitting - Scoot to Edge of Bed: 5: Supervision Details for Bed Mobility Assistance: pt used KI to move right leg to and off edge of bed. cues for use of UE's and placement to ease transition of sitting up to edge of bed. Transfers Sit to Stand: 4: Min guard;From bed;From chair/3-in-1;With upper extremity assist;With armrests Stand to Sit: 4: Min guard;To chair/3-in-1;With upper extremity assist;With armrests Details for Transfer Assistance: cues for hand placement with sit/stand transfer to [facility] from bed, none needed with standing from [facility] or sitting to recliner. Ambulation/Gait Ambulation/Gait Assistance: 4: Min guard Ambulation Distance (Feet): 80 Feet Assistive device: Rolling walker Ambulation/Gait Assistance Details: min cues for gait sequency, posture, and walker postion. Gait Pattern: Step-through pattern;Decreased stance time - left;Decreased step length - right;Trunk flexed Gait velocity: Decreased    Exercises Total Joint Exercises Ankle  Circles/Pumps: AROM;Both;10 reps;Supine Quad Sets: AROM;Strengthening;Left;10 reps;Supine Heel Slides: AAROM;Strengthening;Left;10 reps;Supine Hip ABduction/ADduction: AAROM;Strengthening;Left;10 reps;Supine Straight Leg Raises: AAROM;Strengthening;Left;10 reps;Supine     PT Goals Acute Rehab PT Goals PT Goal: Supine/Side to Sit - Progress: Progressing toward goal PT Goal: Sit to Stand - Progress: Progressing toward goal PT Goal: Stand to Sit - Progress: Progressing toward goal PT Goal: Ambulate - Progress: Progressing toward goal PT Goal: Perform Home Exercise Program - Progress: Progressing toward goal  Visit Information  Last PT Received On: 08/03/12 Assistance Needed: +1    Subjective Data  Subjective: No new complaints and agreeable to therapy today. Reports knee pain as 4/10 currently.   Cognition  Overall Cognitive Status: Appears within functional limits for tasks assessed/performed Arousal/Alertness: Awake/alert Orientation Level: Appears intact for tasks assessed Behavior During Session: [facility] for tasks performed       End of Session PT - End of Session Equipment Utilized During Treatment: Gait belt Activity Tolerance: Patient tolerated treatment well Patient left: in chair;with call bell/phone within reach Nurse Communication: Mobility status CPM Right Knee CPM Right Knee: Off Right Knee Flexion (Degrees): 30  Right Knee Extension (Degrees): 0    GP     Sallyanne Kuster 08/03/2012, 10:46 AM  Sallyanne Kuster, PTA Office- [phone]

## 2012-08-03 NOTE — Progress Notes (Signed)
Subjective: Pt stable - moving well   Objective: Vital signs in last 24 hours: Temp:  [97.9 F (36.6 C)-98.6 F (37 C)] 98.6 F (37 C) (12/12 0700) Pulse Rate:  [79-91] 79  (12/12 0700) Resp:  [14-20] 20  (12/12 0800) BP: (127-146)/(53-67) 137/53 mmHg (12/12 0700) SpO2:  [95 %-99 %] 95 % (12/12 0800)  Intake/Output from previous day: 12/11 0701 - 12/12 0700 In: 270 [P.O.:270] Out: 400 [Urine:400] Intake/Output this shift:    Exam:  Neurovascular intact Sensation intact distally Intact pulses distally incision ok dressing changed  Labs:  Basename 08/03/12 0645 08/02/12 0555  HGB 9.0* 9.5*    Basename 08/03/12 0645 08/02/12 0555  WBC 8.1 8.5  RBC 3.00* 3.22*  HCT 27.6* 29.3*  PLT 189 274    Basename 08/02/12 0555  NA 139  K 3.8  CL 101  CO2 28  BUN 11  CREATININE 0.80  GLUCOSE 138*  CALCIUM 8.6    Basename 08/03/12 0645 08/02/12 0555  LABPT -- --  INR 1.42 1.15    Assessment/Plan: Mobilize with PT - CPM - possible dc am or Sat   DEAN,GREGORY SCOTT 08/03/2012, 12:58 PM

## 2012-08-03 NOTE — Progress Notes (Signed)
PT is recommending home with [facility] and not SNF. Clinical Social Worker will sign off for now as social work intervention is no longer needed. Please consult [place] again if new need arises.   Sabino Niemann, MSW, [employer] [phone]

## 2012-08-04 LAB — CBC
HCT: 23.1 % — ABNORMAL LOW (ref 36.0–46.0)
MCH: 30 pg (ref 26.0–34.0)
MCV: 91.3 fL (ref 78.0–100.0)
RDW: 14.7 % (ref 11.5–15.5)
WBC: 8.1 10*3/uL (ref 4.0–10.5)

## 2012-08-04 LAB — GLUCOSE, CAPILLARY
Glucose-Capillary: 91 mg/dL (ref 70–99)
Glucose-Capillary: 124 mg/dL — ABNORMAL HIGH (ref 70–99)

## 2012-08-04 MED ORDER — WARFARIN SODIUM 7.5 MG PO TABS
7.5000 mg | ORAL_TABLET | Freq: Once | ORAL | Status: AC
  Administered 2012-08-04: 7.5 mg via ORAL
  Filled 2012-08-04: qty 1

## 2012-08-04 MED ORDER — WARFARIN SODIUM 5 MG PO TABS: 5.0000 mg | ORAL_TABLET | Freq: Every day | ORAL | Status: DC

## 2012-08-04 MED ORDER — METHOCARBAMOL 500 MG PO TABS: 500.0000 mg | ORAL_TABLET | Freq: Four times a day (QID) | ORAL | Status: DC | PRN

## 2012-08-04 MED ORDER — OXYCODONE HCL 5 MG PO TABS: 5.0000 mg | ORAL_TABLET | ORAL | Status: DC | PRN

## 2012-08-04 NOTE — Progress Notes (Signed)
Physical Therapy Treatment Patient Details Name: [name] MRN: [id number] DOB: 1946/07/27 Today's Date: 08/04/2012 Time: 1478-2956 PT Time Calculation (min): 29 min  PT Assessment / Plan / Recommendation Comments on Treatment Session  Pt's Hgb is low (7.6).  Decided to proceed with PT session as pt was asymptomatic.  Pt had no difficulty with activities in PT session.      Follow Up Recommendations  Home health PT;Supervision - Intermittent     Equipment Recommendations  None recommended by PT    Frequency 7X/week   Plan Discharge plan remains appropriate;Frequency remains appropriate    Precautions / Restrictions Precautions Precautions: Knee Required Braces or Orthoses: Knee Immobilizer - Right Restrictions Weight Bearing Restrictions: Yes RLE Weight Bearing: Weight bearing as tolerated   Pertinent Vitals/Pain No c/o pain.    Mobility  Bed Mobility Bed Mobility: Not assessed Transfers Transfers: Sit to Stand;Stand to Sit;Stand Pivot Transfers Sit to Stand: 6: Modified independent (Device/Increase time) Stand to Sit: 6: Modified independent (Device/Increase time) Stand Pivot Transfers: 6: Modified independent (Device/Increase time) Ambulation/Gait Ambulation/Gait Assistance: 5: Supervision Ambulation Distance (Feet): 150 Feet Assistive device: Rolling walker Ambulation/Gait Assistance Details: supervison for safety secondary to low Hgb.   Gait Pattern: Step-through pattern;Decreased stance time - left;Decreased step length - right;Trunk flexed Stairs: No    Exercises     PT Goals Acute Rehab PT Goals PT Goal Formulation: With patient Time For Goal Achievement: 08/09/12 Potential to Achieve Goals: Good Pt will go Sit to Stand: with modified independence PT Goal: Sit to Stand - Progress: Met Pt will go Stand to Sit: with modified independence PT Goal: Stand to Sit - Progress: Met Pt will Ambulate: 51 - 150 feet;with supervision;with rolling walker PT Goal:  Ambulate - Progress: Met  Visit Information  Last PT Received On: 08/04/12    Subjective Data  Subjective: Hgb is low 7.6 not going home until tomorrow. Patient Stated Goal: Walk without pain.    Cognition  Overall Cognitive Status: Appears within functional limits for tasks assessed/performed Arousal/Alertness: Awake/alert Orientation Level: Appears intact for tasks assessed Behavior During Session: [name] for tasks performed    Balance     End of Session PT - End of Session Equipment Utilized During Treatment: Gait belt Activity Tolerance: Patient tolerated treatment well Patient left: in chair;with call bell/phone within reach Nurse Communication: Mobility status   GP     [name] 08/04/2012, 9:58 AM Theron Arista L. Vincent Streater DPT [phone]

## 2012-08-04 NOTE — Progress Notes (Signed)
Subjective: Pt stable but feels poorly this am - progressing with PT   Objective: Vital signs in last 24 hours: Temp:  [97.6 F (36.4 C)-99 F (37.2 C)] 98.2 F (36.8 C) (12/13 0612) Pulse Rate:  [70-74] 70  (12/13 0612) Resp:  [18] 18  (12/13 0612) BP: (84-130)/(45-68) 130/54 mmHg (12/13 0612) SpO2:  [93 %-99 %] 99 % (12/13 0612)  Intake/Output from previous day: 12/12 0701 - 12/13 0700 In: 295 [P.O.:240; IV Piggyback:55] Out: -  Intake/Output this shift:    Exam:  Neurovascular intact Sensation intact distally Intact pulses distally Dorsiflexion/Plantar flexion intact  Labs:  Basename 08/04/12 0500 08/03/12 0645 08/02/12 0555  HGB 7.6* 9.0* 9.5*    Basename 08/04/12 0500 08/03/12 0645  WBC 8.1 8.1  RBC 2.53* 3.00*  HCT 23.1* 27.6*  PLT 212 189    Basename 08/02/12 0555  NA 139  K 3.8  CL 101  CO2 28  BUN 11  CREATININE 0.80  GLUCOSE 138*  CALCIUM 8.6    Basename 08/04/12 0500 08/03/12 0645  LABPT -- --  INR 1.28 1.42    Assessment/Plan: inr not yet therepeutic - hgb low - will keep today - plan dc am   [name] 08/04/2012, 8:14 AM

## 2012-08-04 NOTE — Progress Notes (Signed)
CARE MANAGEMENT NOTE 08/04/2012  Patient:  [name],[name]   Account Number:  [id number]  Date Initiated:  08/04/2012  Documentation initiated by:  Vance Peper  Subjective/Objective Assessment:   66 yr old female s/p right total knee arthroplasty     Action/Plan:   CM spoke with patient concerning home health and DME needs at discharge. Choice offered. rolling walker, 3in1 and CPM have been delivered to patient's home.   Anticipated DC Date:  08/05/2012   Anticipated DC Plan:  HOME W HOME HEALTH SERVICES      DC Planning Services  CM consult      [facility] Choice  HOME HEALTH   Choice offered to / List presented to:  C-1 Patient        HH arranged  HH-1 RN  HH-2 PT      [facility] agency  [facility]   Status of service:  Completed, signed off Medicare Important Message given?   (If response is "NO", the following Medicare IM given date fields will be blank) Date Medicare IM given:   Date Additional Medicare IM given:    Discharge Disposition:  HOME W HOME HEALTH SERVICES  Per UR Regulation:    If discussed at Long Length of Stay Meetings, dates discussed:    Comments:

## 2012-08-04 NOTE — Progress Notes (Signed)
Physical Therapy Treatment Patient Details Name: [name] MRN: [id number] DOB: 01-Dec-1945 Today's Date: 08/04/2012 Time: 0865-7846 PT Time Calculation (min): 33 min  PT Assessment / Plan / Recommendation Comments on Treatment Session  pt doing very well. strength and ROM improved. Pt able to manage R LE independently without KI.  0-80 degrees of AROM    Follow Up Recommendations  Home health PT;Supervision - Intermittent     Does the patient have the potential to tolerate intense rehabilitation     Barriers to Discharge        Equipment Recommendations  None recommended by PT    Recommendations for Other Services    Frequency 7X/week   Plan Discharge plan remains appropriate;Frequency remains appropriate    Precautions / Restrictions Precautions Precautions: Knee Required Braces or Orthoses: Knee Immobilizer - Right Restrictions RLE Weight Bearing: Weight bearing as tolerated   Pertinent Vitals/Pain 2/10pain in knee no intervention required per pt.     Mobility  Bed Mobility Bed Mobility: Supine to Sit Supine to Sit: 7: Independent;HOB flat (NO KI) Transfers Transfers: Sit to Stand;Stand to Sit;Stand Pivot Transfers Sit to Stand: 6: Modified independent (Device/Increase time);With upper extremity assist;From bed Stand to Sit: 6: Modified independent (Device/Increase time);To chair/3-in-1;With upper extremity assist Stand Pivot Transfers: 6: Modified independent (Device/Increase time) Details for Transfer Assistance: No assist needed.  Ambulation/Gait Ambulation/Gait Assistance: Not tested (comment)    Exercises Total Joint Exercises Ankle Circles/Pumps: AROM;Both;10 reps;Supine Quad Sets: AROM;Strengthening;10 reps;Supine;Right Short Arc Quad: 10 reps;Right;AROM Heel Slides: AROM;Right;10 reps Hip ABduction/ADduction: AAROM;Strengthening;Left;10 reps;Supine Straight Leg Raises: Right;10 reps;AROM   PT Diagnosis:    PT Problem List:   PT Treatment  Interventions:     PT Goals Acute Rehab PT Goals PT Goal Formulation: With patient Time For Goal Achievement: 08/09/12 Potential to Achieve Goals: Good Pt will go Supine/Side to Sit: Independently PT Goal: Supine/Side to Sit - Progress: Met Pt will go Sit to Supine/Side: Independently Pt will go Sit to Stand: with modified independence PT Goal: Sit to Stand - Progress: Met Pt will go Stand to Sit: with modified independence PT Goal: Stand to Sit - Progress: Met Pt will Ambulate: 51 - 150 feet;with supervision;with rolling walker Pt will Go Up / Down Stairs: 3-5 stairs;with rolling walker;with min assist Pt will Perform Home Exercise Program: with supervision, verbal cues required/provided PT Goal: Perform Home Exercise Program - Progress: Progressing toward goal  Visit Information  Last PT Received On: 08/04/12    Subjective Data      Cognition  Overall Cognitive Status: Appears within functional limits for tasks assessed/performed Arousal/Alertness: Awake/alert Orientation Level: Appears intact for tasks assessed Behavior During Session: [facility] for tasks performed    Balance     End of Session PT - End of Session Equipment Utilized During Treatment: Gait belt Activity Tolerance: Patient tolerated treatment well Patient left: in chair;with call bell/phone within reach Nurse Communication: Mobility status CPM Right Knee CPM Right Knee: On Right Knee Flexion (Degrees): 30  Right Knee Extension (Degrees): 0    GP     Sherene Plancarte 08/04/2012, 4:34 PM Tanya Crothers L. Tradarius Reinwald DPT [phone]

## 2012-08-04 NOTE — Progress Notes (Signed)
ANTICOAGULATION CONSULT NOTE - Follow Up Consult  Pharmacy Consult for Coumadin Indication: VTE prophylaxis  Allergies  Allergen Reactions  . Monopril (Fosinopril) Cough   Vital Signs: Temp: 98.2 F (36.8 C) (12/13 0612) BP: 130/54 mmHg (12/13 0612) Pulse Rate: 70  (12/13 0612)  Labs:  Basename 08/04/12 0500 08/03/12 0645 08/02/12 0555  HGB 7.6* 9.0* --  HCT 23.1* 27.6* 29.3*  PLT 212 189 274  APTT -- -- --  LABPROT 15.7* 17.0* 14.5  INR 1.28 1.42 1.15  HEPARINUNFRC -- -- --  CREATININE -- -- 0.80  CKTOTAL -- -- --  CKMB -- -- --  TROPONINI -- -- --   Assessment: 66 yo female on coumadin for VTE prophylaxis s/p R TKA on 08/01/12. CBC consistent with post-op anemia, slightly low but stable. INR 1.28 today. INR actually trended down today. All doses documented.  Goal of Therapy:  INR 2-3 Monitor platelets by anticoagulation protocol: Yes   Plan:  1) Coumadin 7.5mg  PO x1 2) F/u INR & CBC in AM 3) Monitor for signs/symptoms of bleeding

## 2012-08-05 DIAGNOSIS — D62 Acute posthemorrhagic anemia: Secondary | ICD-10-CM

## 2012-08-05 DIAGNOSIS — M1711 Unilateral primary osteoarthritis, right knee: Secondary | ICD-10-CM

## 2012-08-05 HISTORY — DX: Acute posthemorrhagic anemia: D62

## 2012-08-05 HISTORY — DX: Unilateral primary osteoarthritis, right knee: M17.11

## 2012-08-05 LAB — GLUCOSE, CAPILLARY
Glucose-Capillary: 135 mg/dL — ABNORMAL HIGH (ref 70–99)
Glucose-Capillary: 79 mg/dL (ref 70–99)

## 2012-08-05 NOTE — Progress Notes (Signed)
Subjective: 4 Days Post-Op Procedure(s) (LRB): TOTAL KNEE ARTHROPLASTY (Right) Patient reports pain as mild.    Objective: Vital signs in last 24 hours: Temp:  [98.1 F (36.7 Alexander)-98.6 F (37 Alexander)] 98.1 F (36.7 Alexander) (12/14 0559) Pulse Rate:  [68-90] 75  (12/14 0559) Resp:  [18] 18  (12/14 0800) BP: (100-135)/(57-60) 109/57 mmHg (12/14 0559) SpO2:  [95 %-100 %] 100 % (12/14 0800)  Intake/Output from previous day: 12/13 0701 - 12/14 0700 In: 362 [P.O.:360; IV Piggyback:2] Out: -  Intake/Output this shift:     Basename 08/04/12 0500 08/03/12 0645  HGB 7.6* 9.0*    Basename 08/04/12 0500 08/03/12 0645  WBC 8.1 8.1  RBC 2.53* 3.00*  HCT 23.1* 27.6*  PLT 212 189   No results found for this basename: NA:2,K:2,CL:2,CO2:2,BUN:2,CREATININE:2,GLUCOSE:2,CALCIUM:2 in the last 72 hours  Basename 08/05/12 0525 08/04/12 0500  LABPT -- --  INR 1.33 1.28    Neurologically intact  Assessment/Plan: 4 Days Post-Op Procedure(s) (LRB): TOTAL KNEE ARTHROPLASTY (Right) Discharge home with home health  [name] 08/05/2012, 10:44 AM

## 2012-08-05 NOTE — Discharge Summary (Signed)
Physician Discharge Summary  Patient ID: [name] MRN: [id number] DOB/AGE: March 04, 1946 66 y.o.  Admit date: 08/01/2012 Discharge date: 08/05/2012  Admission Diagnoses:right knee OA   Discharge Diagnoses: same2 Principal Problem:  *Osteoarthritis of right knee   Discharged Condition: good  Hospital Course: underwent TKA by Dr. August Saucer, routine post op course, acute blood loss anemia, monitored and no transfusion needed. Did well with PT  Consults: None  Significant Diagnostic Studies: labs: Hgb12.0  , dropped to 7.6 post op   Treatments: surgery: above  Discharge Exam: Blood pressure 109/57, pulse 75, temperature 98.1 F (36.7 C), temperature source Oral, resp. rate 18, SpO2 100.00%. incision clean and dry  Disposition:   Discharge Orders    Future Orders Please Complete By Expires   Diet - low sodium heart healthy      Call MD / Call 911      Comments:   If you experience chest pain or shortness of breath, CALL 911 and be transported to the hospital emergency room.  If you develope a fever above 101 F, pus (white drainage) or increased drainage or redness at the wound, or calf pain, call your surgeon's office.   Constipation Prevention      Comments:   Drink plenty of fluids.  Prune juice may be helpful.  You may use a stool softener, such as Colace (over the counter) 100 mg twice a day.  Use MiraLax (over the counter) for constipation as needed.   Increase activity slowly as tolerated      Discharge instructions      Comments:   1.CPM 2hours per 8 2.Keep incision dry 3.Weight bearing as tolerated with crutches/ walker       Medication List     As of 08/05/2012 10:54 AM    TAKE these medications         amLODipine 10 MG tablet   Commonly known as: NORVASC   Take 10 mg by mouth daily.      aspirin EC 81 MG tablet   Take 81 mg by mouth daily.      atorvastatin 40 MG tablet   Commonly known as: LIPITOR   Take 40 mg by mouth daily.      CALCIUM 600 + D PO    Take 1 tablet by mouth 2 (two) times daily.      cloNIDine 0.2 MG tablet   Commonly known as: CATAPRES   Take 0.2 mg by mouth 2 (two) times daily.      estradiol 0.5 MG tablet   Commonly known as: ESTRACE   Take 0.5 mg by mouth 2 (two) times daily.      fish oil-omega-3 fatty acids 1000 MG capsule   Take 1 g by mouth daily.      glipiZIDE 5 MG tablet   Commonly known as: GLUCOTROL   Take 5 mg by mouth daily.      losartan-hydrochlorothiazide 50-12.5 MG per tablet   Commonly known as: HYZAAR   Take 1 tablet by mouth daily.      metFORMIN 1000 MG tablet   Commonly known as: GLUCOPHAGE   Take 1,000 mg by mouth 2 (two) times daily with a meal.      methocarbamol 500 MG tablet   Commonly known as: ROBAXIN   Take 1 tablet (500 mg total) by mouth every 6 (six) hours as needed.      metoprolol tartrate 25 MG tablet   Commonly known as: LOPRESSOR   Take 12.5 mg by  mouth 2 (two) times daily.      multivitamins ther. w/minerals Tabs   Take 1 tablet by mouth daily.      omeprazole 20 MG capsule   Commonly known as: PRILOSEC   Take 20 mg by mouth daily.      oxyCODONE 5 MG immediate release tablet   Commonly known as: Oxy IR/ROXICODONE   Take 1-2 tablets (5-10 mg total) by mouth every 3 (three) hours as needed.      warfarin 5 MG tablet   Commonly known as: COUMADIN   Take 1 tablet (5 mg total) by mouth daily.           Follow-up Information    Follow up with Cammy Copa, MD. In 6 days. (one to two weeks, call office for appt. )    Contact information:   [address] [place] [address] [phone]          Signed: Eldred Manges 08/05/2012, 10:54 AM

## 2012-09-27 ENCOUNTER — Ambulatory Visit
Admission: RE | Admit: 2012-09-27 | Discharge: 2012-09-27 | Disposition: A | Discharge status: Home / Self Care | Payer: BC Managed Care – PPO | Source: Ambulatory Visit | Attending: Physician Assistant | Admitting: Physician Assistant

## 2012-09-27 ENCOUNTER — Other Ambulatory Visit: Payer: Self-pay | Admitting: Physician Assistant

## 2012-09-27 DIAGNOSIS — R52 Pain, unspecified: Secondary | ICD-10-CM

## 2012-09-28 DIAGNOSIS — M069 Rheumatoid arthritis, unspecified: Secondary | ICD-10-CM

## 2012-09-28 HISTORY — DX: Rheumatoid arthritis, unspecified: M06.9

## 2012-10-14 ENCOUNTER — Encounter: Payer: Self-pay | Admitting: Family Medicine

## 2012-10-14 DIAGNOSIS — Z79890 Hormone replacement therapy (postmenopausal): Secondary | ICD-10-CM | POA: Insufficient documentation

## 2012-10-14 DIAGNOSIS — I10 Essential (primary) hypertension: Secondary | ICD-10-CM

## 2012-10-14 DIAGNOSIS — E119 Type 2 diabetes mellitus without complications: Secondary | ICD-10-CM | POA: Insufficient documentation

## 2012-10-14 DIAGNOSIS — E785 Hyperlipidemia, unspecified: Secondary | ICD-10-CM | POA: Insufficient documentation

## 2012-10-14 DIAGNOSIS — K760 Fatty (change of) liver, not elsewhere classified: Secondary | ICD-10-CM | POA: Insufficient documentation

## 2012-10-14 HISTORY — DX: Essential (primary) hypertension: I10

## 2012-11-09 ENCOUNTER — Other Ambulatory Visit (HOSPITAL_COMMUNITY): Payer: Self-pay | Admitting: Rheumatology

## 2012-11-09 ENCOUNTER — Ambulatory Visit (HOSPITAL_COMMUNITY)
Admission: RE | Admit: 2012-11-09 | Discharge: 2012-11-09 | Disposition: A | Discharge status: Home / Self Care | Payer: BC Managed Care – PPO | Source: Ambulatory Visit | Attending: Rheumatology | Admitting: Rheumatology

## 2012-11-09 DIAGNOSIS — R52 Pain, unspecified: Secondary | ICD-10-CM

## 2012-11-09 DIAGNOSIS — M069 Rheumatoid arthritis, unspecified: Secondary | ICD-10-CM | POA: Insufficient documentation

## 2012-11-29 ENCOUNTER — Other Ambulatory Visit: Payer: Self-pay | Admitting: Physician Assistant

## 2012-11-30 NOTE — Telephone Encounter (Signed)
Med rf per protocol °

## 2012-12-18 ENCOUNTER — Telehealth: Payer: Self-pay | Admitting: Physician Assistant

## 2012-12-18 MED ORDER — GLIPIZIDE 5 MG PO TABS: 5.0000 mg | ORAL_TABLET | Freq: Every day | ORAL | Status: DC

## 2012-12-18 NOTE — Telephone Encounter (Signed)
Medication refilled per protocol.  Pt appt pending 12/27/12.

## 2012-12-20 ENCOUNTER — Telehealth: Payer: Self-pay | Admitting: Physician Assistant

## 2012-12-20 MED ORDER — GLIPIZIDE 5 MG PO TABS: 5.0000 mg | ORAL_TABLET | Freq: Every day | ORAL | Status: DC

## 2012-12-20 NOTE — Telephone Encounter (Signed)
Medication refilled per protocol.Patient needs to be seen before any further refills Pt has appt next week. 

## 2012-12-21 ENCOUNTER — Encounter: Payer: Self-pay | Admitting: Physician Assistant

## 2012-12-21 ENCOUNTER — Ambulatory Visit (INDEPENDENT_AMBULATORY_CARE_PROVIDER_SITE_OTHER): Payer: BC Managed Care – PPO | Admitting: Physician Assistant

## 2012-12-21 VITALS — BP 126/70 | HR 60 | Temp 97.6°F | Resp 16 | Ht 59.0 in | Wt 176.0 lb

## 2012-12-21 DIAGNOSIS — D62 Acute posthemorrhagic anemia: Secondary | ICD-10-CM

## 2012-12-22 NOTE — Progress Notes (Signed)
Patient ID: [name] MRN: [id number], DOB: 1946/06/17, 67 y.o. Date of Encounter: 12/22/2012, 7:25 AM    Chief Complaint:  Chief Complaint  Patient presents with  . low hemaglobin per rheumatologist     HPI: 67 y.o. year old female here to evaluate anemia. Dr.Deveshwar recently did lab work 12/15/12 that revealed H/H 9.8 / 30.1. Pt here to evaluate. Pt reports that she had knee surgery recently but was not told anything about any blood loss at surgery. She has had no bleeding. No melena or hematochezia. Says her knee has been "great" and that she has not noticed any increased fatigue. No shortness of breath, lightheadedness. Is feeling very good.     Home Meds: Current Outpatient Prescriptions on File Prior to Visit  Medication Sig Dispense Refill  . amLODipine (NORVASC) 10 MG tablet TAKE 1 TABLET DAILY  90 tablet  2  . aspirin EC 81 MG tablet Take 81 mg by mouth daily.      Marland Kitchen atorvastatin (LIPITOR) 40 MG tablet Take 40 mg by mouth daily.      . Calcium Carbonate-Vitamin D (CALCIUM 600 + D PO) Take 1 tablet by mouth 2 (two) times daily.      . cloNIDine (CATAPRES) 0.2 MG tablet Take 0.2 mg by mouth 2 (two) times daily.      Marland Kitchen estradiol (ESTRACE) 0.5 MG tablet Take 0.5 mg by mouth 2 (two) times daily.      . fish oil-omega-3 fatty acids 1000 MG capsule Take 1 g by mouth daily.      Marland Kitchen glipiZIDE (GLUCOTROL) 5 MG tablet Take 1 tablet (5 mg total) by mouth daily.  30 tablet  0  . losartan-hydrochlorothiazide (HYZAAR) 50-12.5 MG per tablet Take 1 tablet by mouth daily.      . metFORMIN (GLUCOPHAGE) 1000 MG tablet Take 1,000 mg by mouth 2 (two) times daily with a meal.      . metoprolol tartrate (LOPRESSOR) 25 MG tablet Take 12.5 mg by mouth 2 (two) times daily.      . Multiple Vitamins-Minerals (MULTIVITAMINS THER. W/MINERALS) TABS Take 1 tablet by mouth daily.      Marland Kitchen omeprazole (PRILOSEC) 20 MG capsule Take 20 mg by mouth daily.      . meloxicam (MOBIC) 7.5 MG tablet Take 7.5 mg by  mouth daily.      . methocarbamol (ROBAXIN) 500 MG tablet Take 1 tablet (500 mg total) by mouth every 6 (six) hours as needed.  30 tablet  0  . oxyCODONE (OXY IR/ROXICODONE) 5 MG immediate release tablet Take 1-2 tablets (5-10 mg total) by mouth every 3 (three) hours as needed.  60 tablet  0  . warfarin (COUMADIN) 5 MG tablet Take 1 tablet (5 mg total) by mouth daily.  30 tablet  1   No current facility-administered medications on file prior to visit.    Allergies:  Allergies  Allergen Reactions  . Monopril (Fosinopril) Cough      Review of Systems: See HPI for pertinent ROS. Other ROS negative.    Physical Exam: Blood pressure 126/70, pulse 60, temperature 97.6 F (36.4 C), temperature source Oral, resp. rate 16, height 4\' 11"  (1.499 m), weight 176 lb (79.833 kg)., Body mass index is 35.53 kg/(m^2). General: Mild obese WF. Appears in no acute distress. Lungs: Clear bilaterally to auscultation without wheezes, rales, or rhonchi. Breathing is unlabored. Heart: Regular rhythm. No murmurs, rubs, or gallops. Msk:  Strength and tone normal for age. Extremities/Skin: Warm and dry.  Neuro: Alert and oriented X 3. Moves all extremities spontaneously. Gait is normal. CNII-XII grossly in tact. Psych:  Responds to questions appropriately with a normal affect.     ASSESSMENT AND PLAN:  67 y.o. year old female with  1. Postoperative anemia due to acute blood loss I reviewed labs in Epic. She reports that her surgery was on 08/01/2012.  07/27/2012 H/H = 12.0/36.8 08/02/2012 H/H = 9.5/29.3  4/25/204 H/H = 9.8/30.1  Anemia secondary to blood loss at surgery. H/H stable since then.  Also, I reviewed notes from hospitalization from anticoagulation consult post op- notes specifically reported no bleeding and they were monitoring H/H and signs, symptoms of bleeding.   I recommended that she take otc iron to help boost this. O/W no further eval needed.   Signed, [address] [place], [place],  [facility] 12/22/2012 7:25 AM

## 2012-12-27 ENCOUNTER — Encounter: Payer: Self-pay | Admitting: Physician Assistant

## 2012-12-27 ENCOUNTER — Ambulatory Visit (INDEPENDENT_AMBULATORY_CARE_PROVIDER_SITE_OTHER): Payer: BC Managed Care – PPO | Admitting: Physician Assistant

## 2012-12-27 VITALS — BP 120/70 | HR 76 | Temp 97.7°F | Resp 18 | Ht <= 58 in | Wt 172.0 lb

## 2012-12-27 DIAGNOSIS — M069 Rheumatoid arthritis, unspecified: Secondary | ICD-10-CM

## 2012-12-27 DIAGNOSIS — M1710 Unilateral primary osteoarthritis, unspecified knee: Secondary | ICD-10-CM

## 2012-12-27 DIAGNOSIS — E119 Type 2 diabetes mellitus without complications: Secondary | ICD-10-CM

## 2012-12-27 DIAGNOSIS — IMO0002 Reserved for concepts with insufficient information to code with codable children: Secondary | ICD-10-CM

## 2012-12-27 DIAGNOSIS — M1711 Unilateral primary osteoarthritis, right knee: Secondary | ICD-10-CM

## 2012-12-27 DIAGNOSIS — I10 Essential (primary) hypertension: Secondary | ICD-10-CM

## 2012-12-27 DIAGNOSIS — K760 Fatty (change of) liver, not elsewhere classified: Secondary | ICD-10-CM

## 2012-12-27 DIAGNOSIS — E669 Obesity, unspecified: Secondary | ICD-10-CM

## 2012-12-27 DIAGNOSIS — K7689 Other specified diseases of liver: Secondary | ICD-10-CM

## 2012-12-27 DIAGNOSIS — M0579 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 without organ or systems involvement: Secondary | ICD-10-CM | POA: Insufficient documentation

## 2012-12-27 DIAGNOSIS — E785 Hyperlipidemia, unspecified: Secondary | ICD-10-CM

## 2012-12-27 DIAGNOSIS — D62 Acute posthemorrhagic anemia: Secondary | ICD-10-CM

## 2012-12-27 HISTORY — DX: Morbid (severe) obesity due to excess calories: E66.01

## 2012-12-27 HISTORY — DX: Obesity, unspecified: E66.9

## 2012-12-27 NOTE — Progress Notes (Signed)
Patient ID: [name] MRN: [id number], DOB: Feb 02, 1946, 67 y.o. Date of Encounter: @DATE @  Chief Complaint:  Chief Complaint  Patient presents with  . Follow-up    3 mos    HPI: 67 y.o. year old female  presents for routine f/u of;  1-DM:  Taking all meds as directed. No adv effects. Rarely checks blood sugar. No symptoms of hypo or hyper glycemia.  2-HTN: Taking meds as directed. No LE edema. No adverse effects.  3-Hyperlipidemia: Taking Lipitor as directed. No myalgias. No adverse effects.  4- Rheumatoid Arthritis: Was diagnosed with this in 09/2012. Had initial eval here and now being managed by Dr. Corliss Skains.Is on Mehtotrexate.   Walks 30-60 minutes most days. Tries to "walk at least some."     Past Medical History  Diagnosis Date  . Hypertension   . Normal cardiac stress test     pt can't remember when or where  . Diabetes mellitus without complication     Type 2 NIDDM x 15 years  . GERD (gastroesophageal reflux disease)   . Arthritis     osteoarthritis  . Hyperlipidemia   . Hormone replacement therapy (postmenopausal)   . Fatty liver disease, nonalcoholic 2010    hospitalized, MRCP dx fatty liver  . Rheumatoid arthritis 09/28/2012  . Obesity, unspecified 12/27/2012     Home Meds: See attached medication section for current medication list. Any medications entered into computer today will not appear on this note's list. The medications listed below were entered prior to today. Current Outpatient Prescriptions on File Prior to Visit  Medication Sig Dispense Refill  . amLODipine (NORVASC) 10 MG tablet TAKE 1 TABLET DAILY  90 tablet  2  . aspirin EC 81 MG tablet Take 81 mg by mouth daily.      Marland Kitchen atorvastatin (LIPITOR) 40 MG tablet Take 40 mg by mouth daily.      . Calcium Carbonate-Vitamin D (CALCIUM 600 + D PO) Take 1 tablet by mouth 2 (two) times daily.      . cloNIDine (CATAPRES) 0.2 MG tablet Take 0.2 mg by mouth 2 (two) times daily.      Marland Kitchen estradiol (ESTRACE)  0.5 MG tablet Take 0.5 mg by mouth 2 (two) times daily.      . fish oil-omega-3 fatty acids 1000 MG capsule Take 1 g by mouth daily.      . folic acid (FOLVITE) 1 MG tablet Take 1 tablet by mouth 2 (two) times daily.      Marland Kitchen glipiZIDE (GLUCOTROL) 5 MG tablet Take 1 tablet (5 mg total) by mouth daily.  30 tablet  0  . losartan-hydrochlorothiazide (HYZAAR) 50-12.5 MG per tablet Take 1 tablet by mouth daily.      . meloxicam (MOBIC) 7.5 MG tablet Take 7.5 mg by mouth daily.      . metFORMIN (GLUCOPHAGE) 1000 MG tablet Take 1,000 mg by mouth 2 (two) times daily with a meal.      . methocarbamol (ROBAXIN) 500 MG tablet Take 1 tablet (500 mg total) by mouth every 6 (six) hours as needed.  30 tablet  0  . methotrexate (RHEUMATREX) 2.5 MG tablet Take 8 tablets by mouth once a week.      . metoprolol tartrate (LOPRESSOR) 25 MG tablet Take 12.5 mg by mouth 2 (two) times daily.      . Multiple Vitamins-Minerals (MULTIVITAMINS THER. W/MINERALS) TABS Take 1 tablet by mouth daily.      Marland Kitchen omeprazole (PRILOSEC) 20 MG capsule Take  20 mg by mouth daily.      Marland Kitchen oxyCODONE (OXY IR/ROXICODONE) 5 MG immediate release tablet Take 1-2 tablets (5-10 mg total) by mouth every 3 (three) hours as needed.  60 tablet  0  . warfarin (COUMADIN) 5 MG tablet Take 1 tablet (5 mg total) by mouth daily.  30 tablet  1   No current facility-administered medications on file prior to visit.    Allergies:  Allergies  Allergen Reactions  . Monopril (Fosinopril) Cough    History   Social History  . Marital Status: Divorced    Spouse Name: N/A    Number of Children: N/A  . Years of Education: N/A   Occupational History  . Not on file.   Social History Main Topics  . Smoking status: Former Smoker    Types: Cigarettes  . Smokeless tobacco: Never Used     Comment: quit smoking regularly in her 30's; now has an occasional cigarette  . Alcohol Use: No     Comment: occasional  . Drug Use: No  . Sexually Active: No   Other  Topics Concern  . Not on file   Social History Narrative  . No narrative on file    Family History  Problem Relation Age of Onset  . Diabetes Other   . Heart disease Other      Review of Systems:  See HPI for pertinent ROS. All other ROS negative.    Physical Exam: Blood pressure 120/70, pulse 76, temperature 97.7 F (36.5 C), temperature source Oral, resp. rate 18, height 4\' 10"  (1.473 m), weight 172 lb (78.019 kg)., Body mass index is 35.96 kg/(m^2). General: Obese WF. Appears in no acute distress. Neck: Supple. No thyromegaly. Full ROM. No lymphadenopathy. Lungs: Clear bilaterally to auscultation without wheezes, rales, or rhonchi. Breathing is unlabored. Heart: RRR with S1 S2. No murmurs, rubs, or gallops. Abdomen: Soft, non-tender, non-distended with normoactive bowel sounds. No hepatomegaly. No rebound/guarding. No obvious abdominal masses. Musculoskeletal:  Strength and tone normal for age. Extremities/Skin: Warm and dry. No clubbing or cyanosis. No edema. No rashes or suspicious lesions. Neuro: Alert and oriented X 3. Moves all extremities spontaneously. Gait is normal. CNII-XII grossly in tact. Psych:  Responds to questions appropriately with a normal affect. Diabetic Foot Exam; No callouses. No open wounds. Trace DP and PT pulses bilaterally.     ASSESSMENT AND PLAN:  67 y.o. year old female with  1. Type II or unspecified type diabetes mellitus without mention of complication, not stated as uncontrolled - Hemoglobin A1C, fingerstick Last MicroAlbumin 03/2012 She is aware needs ophthalmology exam. Cannot afford this now.  Foot exam nml.  2. Obesity, unspecified Have discussed diet and exercise at length  3. Hyperlipidemia She just had labs done at work. Will bring those in for me. Had FLP/LFT.  4. Fatty liver disease, nonalcoholic   5. Essential hypertension, benign At goal. On ARB. Had recent BMET-Will review.   6. Acute blood loss anemia She had eval  1-2 weeks ago sec to anemia. This is secondary to recent knee surgery. Operative blood loss.  7. Osteoarthritis of right knee Had recent surgery. Pain resolved.   8. Rheumatoid arthritis Per Dr. Corliss Skains. Newly diagnosed 09/2012  9. Colonoscopy 04/30/2011: Polyp: Report says repeat 5-10 years. Pt says she ws told to repeat 10 years.   10. Pap, Mammogram, BMD: Per Gyn. Sees Gyn annually  11. Immunizations:    Tdap:  10/12    Pneumovax:  10/12    Zostavax:       2/13   SignedShon Hale [place], [place], [facility] 12/27/2012 3:14 PM

## 2013-01-02 NOTE — Progress Notes (Signed)
A1C is 5.9-- a little low. Stop the Glucotrol.  Cont Metformin at current dose.  She has not been checking BS. Tell her to check it over the next 2 weeks-fasting AM- should be 80-120. Call me if much different than this.

## 2013-01-08 NOTE — Addendum Note (Signed)
Addended by: Durwin Nora B on: 01/08/2013 11:36 AM   Modules accepted: Orders

## 2013-01-16 ENCOUNTER — Encounter: Payer: Self-pay | Admitting: Physician Assistant

## 2013-01-16 ENCOUNTER — Ambulatory Visit (INDEPENDENT_AMBULATORY_CARE_PROVIDER_SITE_OTHER): Payer: BC Managed Care – PPO | Admitting: Physician Assistant

## 2013-01-16 VITALS — BP 162/84 | HR 68 | Temp 97.6°F | Resp 18 | Wt 177.0 lb

## 2013-01-16 DIAGNOSIS — R600 Localized edema: Secondary | ICD-10-CM

## 2013-01-16 DIAGNOSIS — I10 Essential (primary) hypertension: Secondary | ICD-10-CM

## 2013-01-16 DIAGNOSIS — R609 Edema, unspecified: Secondary | ICD-10-CM

## 2013-01-16 MED ORDER — AMLODIPINE BESYLATE 5 MG PO TABS: 5.0000 mg | ORAL_TABLET | Freq: Every day | ORAL | Status: DC

## 2013-01-16 MED ORDER — LOSARTAN POTASSIUM-HCTZ 100-12.5 MG PO TABS: 1.0000 | ORAL_TABLET | Freq: Every day | ORAL | Status: DC

## 2013-01-16 NOTE — Progress Notes (Signed)
Patient ID: [name] MRN: [id number], DOB: 02/07/1946, 67 y.o. Date of Encounter: 01/16/2013, 8:57 AM    Chief Complaint:  Chief Complaint  Patient presents with  . sent by rheumatologist because of foot swelling     HPI: 67 y.o. year old female here for evaluation of swelling in both lower legs and feet. Noticed this about 2-3 weeks ago. Recently saw Rheumatologist and they noted the edema and told her to f/u here. She is taking all current meds as directed.   Home Meds: See attached medication section for any medications that were entered at today's visit. The computer does not put those onto this list.The following list is a list of meds entered prior to today's visit.   Current Outpatient Prescriptions on File Prior to Visit  Medication Sig Dispense Refill  . aspirin EC 81 MG tablet Take 81 mg by mouth daily.      Marland Kitchen atorvastatin (LIPITOR) 40 MG tablet Take 40 mg by mouth daily.      . Calcium Carbonate-Vitamin D (CALCIUM 600 + D PO) Take 1 tablet by mouth 2 (two) times daily.      . cloNIDine (CATAPRES) 0.2 MG tablet Take 0.2 mg by mouth 2 (two) times daily.      Marland Kitchen estradiol (ESTRACE) 0.5 MG tablet Take 0.5 mg by mouth 2 (two) times daily.      . fish oil-omega-3 fatty acids 1000 MG capsule Take 1 g by mouth daily.      . folic acid (FOLVITE) 1 MG tablet Take 1 tablet by mouth 2 (two) times daily.      Marland Kitchen glipiZIDE (GLUCOTROL) 5 MG tablet Take 1 tablet (5 mg total) by mouth daily.  30 tablet  0  . metFORMIN (GLUCOPHAGE) 1000 MG tablet Take 1,000 mg by mouth 2 (two) times daily with a meal.      . methotrexate (RHEUMATREX) 2.5 MG tablet Take 8 tablets by mouth once a week.      . metoprolol tartrate (LOPRESSOR) 25 MG tablet Take 12.5 mg by mouth 2 (two) times daily.      . Multiple Vitamins-Minerals (MULTIVITAMINS THER. W/MINERALS) TABS Take 1 tablet by mouth daily.      Marland Kitchen omeprazole (PRILOSEC) 20 MG capsule Take 20 mg by mouth daily.      . meloxicam (MOBIC) 7.5 MG tablet Take  7.5 mg by mouth daily.      . methocarbamol (ROBAXIN) 500 MG tablet Take 1 tablet (500 mg total) by mouth every 6 (six) hours as needed.  30 tablet  0  . oxyCODONE (OXY IR/ROXICODONE) 5 MG immediate release tablet Take 1-2 tablets (5-10 mg total) by mouth every 3 (three) hours as needed.  60 tablet  0  . warfarin (COUMADIN) 5 MG tablet Take 1 tablet (5 mg total) by mouth daily.  30 tablet  1   No current facility-administered medications on file prior to visit.    Allergies:  Allergies  Allergen Reactions  . Monopril (Fosinopril) Cough      Review of Systems: See HPI for pertinent ROS. All other ROS negative.    Physical Exam: Blood pressure 162/84, pulse 68, temperature 97.6 F (36.4 C), temperature source Oral, resp. rate 18, weight 177 lb (80.287 kg)., Body mass index is 37 kg/(m^2). General: Overweight WF.  Appears in no acute distress. Neck: Supple. No thyromegaly. No lymphadenopathy. Lungs: Clear bilaterally to auscultation without wheezes, rales, or rhonchi. Breathing is unlabored. Heart: Regular rhythm. No murmurs, rubs, or gallops.  Msk:  Strength and tone normal for age. Extremities/Skin: 2 + pitting edema half way up calves bilaterally.  Neuro: Alert and oriented X 3. Moves all extremities spontaneously. Gait is normal. CNII-XII grossly in tact. Psych:  Responds to questions appropriately with a normal affect.     ASSESSMENT AND PLAN:  67 y.o. year old female with  1. Bilateral lower extremity edema 2. Essential hypertension, benign  Will decrease Norvasc from 10mg  to 5 mg QD. Will increase Hyzaar from 50/12.5 to 100/12.5.  We will monitor for 3 weeks. If edema does not improve with just decreasing the Norvasc, then I will either stop the Norvasc all together or increase the HCTZ. I did not increase the HCTZ now because I don't think this is the best for her in the long run regarding renal protection and diabetes.  Also, I discuused Sodium Restriction. Discussed that  any canned foods, including canned vegetables and soups have a lot of sodium as well as frozen dinners.  She is to call me if swelling not improved in 3 weeks.  She says she recently had BP check and it was good. Also, at recent OV here BP was good. (High reading today)  - amLODipine (NORVASC) 5 MG tablet; Take 1 tablet (5 mg total) by mouth daily.  Dispense: 90 tablet; Refill: 3 - losartan-hydrochlorothiazide (HYZAAR) 100-12.5 MG per tablet; Take 1 tablet by mouth daily.  Dispense: 90 tablet; Refill: 3   Signed, [address] [place], [place], [facility] 01/16/2013 8:57 AM

## 2013-01-23 ENCOUNTER — Other Ambulatory Visit: Payer: Self-pay | Admitting: Physician Assistant

## 2013-01-23 ENCOUNTER — Encounter: Payer: Self-pay | Admitting: Physician Assistant

## 2013-02-06 ENCOUNTER — Telehealth: Payer: Self-pay | Admitting: Physician Assistant

## 2013-02-06 MED ORDER — GLIPIZIDE 5 MG PO TABS: 5.0000 mg | ORAL_TABLET | Freq: Every day | ORAL | Status: DC

## 2013-02-06 NOTE — Telephone Encounter (Signed)
Medication refilled per protocol. 

## 2013-02-07 ENCOUNTER — Telehealth: Payer: Self-pay | Admitting: Physician Assistant

## 2013-02-07 NOTE — Telephone Encounter (Signed)
Med was refilled yesterday

## 2013-03-07 ENCOUNTER — Other Ambulatory Visit: Payer: Self-pay | Admitting: Physician Assistant

## 2013-03-29 ENCOUNTER — Encounter: Payer: Self-pay | Admitting: Physician Assistant

## 2013-03-29 ENCOUNTER — Ambulatory Visit (INDEPENDENT_AMBULATORY_CARE_PROVIDER_SITE_OTHER): Payer: BC Managed Care – PPO | Admitting: Physician Assistant

## 2013-03-29 VITALS — BP 162/88 | HR 60 | Temp 98.2°F | Resp 18 | Wt 174.0 lb

## 2013-03-29 DIAGNOSIS — I10 Essential (primary) hypertension: Secondary | ICD-10-CM

## 2013-03-29 DIAGNOSIS — K7689 Other specified diseases of liver: Secondary | ICD-10-CM

## 2013-03-29 DIAGNOSIS — E669 Obesity, unspecified: Secondary | ICD-10-CM

## 2013-03-29 DIAGNOSIS — K760 Fatty (change of) liver, not elsewhere classified: Secondary | ICD-10-CM

## 2013-03-29 DIAGNOSIS — K219 Gastro-esophageal reflux disease without esophagitis: Secondary | ICD-10-CM

## 2013-03-29 DIAGNOSIS — E785 Hyperlipidemia, unspecified: Secondary | ICD-10-CM

## 2013-03-29 DIAGNOSIS — E119 Type 2 diabetes mellitus without complications: Secondary | ICD-10-CM

## 2013-03-29 LAB — COMPLETE METABOLIC PANEL WITH GFR
Albumin: 3.6 g/dL (ref 3.5–5.2)
CO2: 25 mEq/L (ref 19–32)
GFR, Est African American: 74 mL/min
GFR, Est Non African American: 64 mL/min
Glucose, Bld: 95 mg/dL (ref 70–99)
Potassium: 4 mEq/L (ref 3.5–5.3)
Sodium: 142 mEq/L (ref 135–145)
Total Bilirubin: 0.4 mg/dL (ref 0.3–1.2)
Total Protein: 6.6 g/dL (ref 6.0–8.3)

## 2013-03-29 LAB — MICROALBUMIN, URINE: Microalb, Ur: 2.47 mg/dL — ABNORMAL HIGH (ref 0.00–1.89)

## 2013-03-29 LAB — HEMOGLOBIN A1C, FINGERSTICK: Hgb A1C (fingerstick): 5.6 % (ref <5.7)

## 2013-03-29 MED ORDER — OMEPRAZOLE 20 MG PO CPDR: 20.0000 mg | DELAYED_RELEASE_CAPSULE | Freq: Every day | ORAL | Status: DC

## 2013-03-29 MED ORDER — CLONIDINE HCL 0.3 MG PO TABS: 0.3000 mg | ORAL_TABLET | Freq: Two times a day (BID) | ORAL | Status: DC

## 2013-03-29 NOTE — Progress Notes (Signed)
Patient ID: [name] MRN: [id number], DOB: May 02, 1946, 67 y.o. Date of Encounter: @DATE @  Chief Complaint:  Chief Complaint  Patient presents with  . routine check up    is fasting    HPI: 67 y.o. year old white female  presents for ROV.   Her Last ROV was 12/27/12. At that time, everything was stable and all meds conted same.   She had OV 01/16/13 sec to LE Edema x 2-3 weeks. Norvasc was decreased 10 to 5mg  and Hyzaar increased 50/12.5 to 100/12.5. She did make these changes. LE Edema resolved on left. Still with some swelling of Right foot an dankle, but this is sec to recent knee surgery.   Diabetes: Still never checks BS. I reviewed that her last A1C was low side. Asked if any symtpoms of HypoGlycemia. Says that some days around 6 pm feels shaky. She does not check BS. She just eats something and sx resolve.  Then asked what times she eats.  Eats Bkfast at 8:30 Lunch at 12 noon Dinner not until after 6 pm.  Discussed that she MUST eat healthy snack at 3 pm.   AtLOV she repoted she was walking 30-60 minutes almost every day. Today she tells me she was doing this until the past 2 weeks-has had high stress. Her granddaughter gave birth-had emergency CSection. A stitch done at CSection "must have come loose"-"We thought we were going to lose her"-Bled a lot-had to get 5 units of blood. Baby still in NICU but ok.   Taking Lipitor with no adv effects/no myalgias.   Past Medical History  Diagnosis Date  . Hypertension   . Normal cardiac stress test     pt can't remember when or where  . Diabetes mellitus without complication     Type 2 NIDDM x 15 years  . GERD (gastroesophageal reflux disease)   . Arthritis     osteoarthritis  . Hyperlipidemia   . Hormone replacement therapy (postmenopausal)   . Fatty liver disease, nonalcoholic 2010    hospitalized, MRCP dx fatty liver  . Rheumatoid arthritis(714.0) 09/28/2012  . Obesity, unspecified 12/27/2012     Home Meds: See attached  medication section for current medication list. Any medications entered into computer today will not appear on this note's list. The medications listed below were entered prior to today. Current Outpatient Prescriptions on File Prior to Visit  Medication Sig Dispense Refill  . amLODipine (NORVASC) 5 MG tablet Take 1 tablet (5 mg total) by mouth daily.  90 tablet  3  . aspirin EC 81 MG tablet Take 81 mg by mouth daily.      Marland Kitchen atorvastatin (LIPITOR) 40 MG tablet Take 40 mg by mouth daily.      . Calcium Carbonate-Vitamin D (CALCIUM 600 + D PO) Take 1 tablet by mouth 2 (two) times daily.      Marland Kitchen estradiol (ESTRACE) 0.5 MG tablet Take 0.5 mg by mouth 2 (two) times daily.      . fish oil-omega-3 fatty acids 1000 MG capsule Take 1 g by mouth daily.      . folic acid (FOLVITE) 1 MG tablet Take 1 tablet by mouth 2 (two) times daily.      Marland Kitchen glipiZIDE (GLUCOTROL) 5 MG tablet TAKE 1 TABLET (5 MG TOTAL) BY MOUTH DAILY.  30 tablet  1  . losartan-hydrochlorothiazide (HYZAAR) 100-12.5 MG per tablet Take 1 tablet by mouth daily.  90 tablet  3  . metFORMIN (GLUCOPHAGE) 1000 MG tablet TAKE  ONE TABLET BY MOUTH TWICE DAILY  180 tablet  0  . methotrexate (RHEUMATREX) 2.5 MG tablet Take 8 tablets by mouth once a week.      . metoprolol tartrate (LOPRESSOR) 25 MG tablet Take 12.5 mg by mouth 2 (two) times daily.      . Multiple Vitamins-Minerals (MULTIVITAMINS THER. W/MINERALS) TABS Take 1 tablet by mouth daily.      . meloxicam (MOBIC) 7.5 MG tablet Take 7.5 mg by mouth daily.       No current facility-administered medications on file prior to visit.    Allergies:  Allergies  Allergen Reactions  . Monopril (Fosinopril) Cough    History   Social History  . Marital Status: Divorced    Spouse Name: N/A    Number of Children: N/A  . Years of Education: N/A   Occupational History  . Not on file.   Social History Main Topics  . Smoking status: Former Smoker    Types: Cigarettes  . Smokeless tobacco:  Never Used     Comment: quit smoking regularly in her 30's; now has an occasional cigarette  . Alcohol Use: No     Comment: occasional  . Drug Use: No  . Sexually Active: No   Other Topics Concern  . Not on file   Social History Narrative  . No narrative on file    Family History  Problem Relation Age of Onset  . Diabetes Other   . Heart disease Other      Review of Systems:  See HPI for pertinent ROS. All other ROS negative.    Physical Exam: Blood pressure 162/88, pulse 60, temperature 98.2 F (36.8 C), temperature source Oral, resp. rate 18, weight 174 lb (78.926 kg)., Body mass index is 36.38 kg/(m^2). General: Obese WF. Pleasant. Appears in no acute distress. Neck: Supple. No thyromegaly. No lymphadenopathy.No carotid bruits.  Lungs: Clear bilaterally to auscultation without wheezes, rales, or rhonchi. Breathing is unlabored. Heart: RRR with S1 S2. No murmurs, rubs, or gallops. Abdomen: Soft, non-tender, non-distended with normoactive bowel sounds. No hepatomegaly. No rebound/guarding. No obvious abdominal masses. Musculoskeletal:  Strength and tone normal for age. Extremities/Skin: Warm and dry. Right Foot, Ankle--puffy. Left with NO edema at all.  Neuro: Alert and oriented X 3. Moves all extremities spontaneously. Gait is normal. CNII-XII grossly in tact. Psych:  Responds to questions appropriately with a normal affect. DIABETIC FOOT EXAM: SEE ATTACHED SECTION     ASSESSMENT AND PLAN:  67 y.o. year old female with  1. Type II or unspecified type diabetes mellitus without mention of complication, not stated as uncontrolled MUST EAT HEALTHY SNACK AT 3 PM QD  - COMPLETE METABOLIC PANEL WITH GFR - Hemoglobin A1c - Microalbumin, urine  2. Essential hypertension, benign BP elevated at LOV also- Increase Clonidine dose - COMPLETE METABOLIC PANEL WITH GFR - cloNIDine (CATAPRES) 0.3 MG tablet; Take 1 tablet (0.3 mg total) by mouth 2 (two) times daily.  Dispense: 180  tablet; Refill: 3  3. Fatty liver disease, nonalcoholic - COMPLETE METABOLIC PANEL WITH GFR  4. Hyperlipidemia - COMPLETE METABOLIC PANEL WITH GFR - Lipid panel  5. Obesity, unspecified - COMPLETE METABOLIC PANEL WITH GFR  6. GERD (gastroesophageal reflux disease) - omeprazole (PRILOSEC) 20 MG capsule; Take 1 capsule (20 mg total) by mouth daily.  Dispense: 90 capsule; Refill: 3  7. OA Right Knee-Recent Surgery-per orhto  8.RA- Per Dr Corliss Skains  9. Colonoscopy: 04/30/2011: Polyp.Reports says repeat 5-10 years. Pt sya told to repeat  10 years.  10. Pap, Mamm, BMD: Sees Gyn annually  11. Immunizations:  TDAP: 10/12  Pneumovax: 10/12  Zostavax: 2/13  Signed, [address] [place], [place], [facility] 03/29/2013 8:34 AM

## 2013-03-29 NOTE — Addendum Note (Signed)
Addended by: WRAY, [place] on: 03/29/2013 09:05 AM   Modules accepted: Orders

## 2013-03-30 ENCOUNTER — Ambulatory Visit: Payer: BC Managed Care – PPO | Admitting: Physician Assistant

## 2013-03-30 ENCOUNTER — Telehealth: Payer: Self-pay | Admitting: Family Medicine

## 2013-03-30 NOTE — Telephone Encounter (Signed)
Pt made aware of lab results.  Told to stop Glipizide (removed from med list)  Continue all other meds the same.  Needs to check sugars daily and report any low readings

## 2013-03-30 NOTE — Telephone Encounter (Signed)
Error

## 2013-03-30 NOTE — Telephone Encounter (Signed)
Message copied by [name] on Fri Mar 30, 2013  9:21 AM ------      Message from: Allayne Butcher      Created: Thu Mar 29, 2013  7:16 PM       A1C is even lower. I am concerned about hypoglycemia. (She never checks BS)       Stop the Glipizide/ Glucotrol. Cont metformin at currnet dose.      Tell her she should still add a healthy "snack" at 3 pm but  She ALSO needs to stop the Glucotrol.        Cholestero is excellent on current med-cont current cholesterol treatment.       Rest of labs nml. ------

## 2013-05-01 ENCOUNTER — Emergency Department (HOSPITAL_COMMUNITY): Payer: BC Managed Care – PPO

## 2013-05-01 ENCOUNTER — Encounter (HOSPITAL_COMMUNITY): Payer: Self-pay

## 2013-05-01 ENCOUNTER — Inpatient Hospital Stay (HOSPITAL_COMMUNITY)
Admission: EM | Admit: 2013-05-01 | Discharge: 2013-05-05 | DRG: 493 | Disposition: A | Discharge status: Home / Self Care | Payer: BC Managed Care – PPO | Attending: Surgery | Admitting: Surgery

## 2013-05-01 DIAGNOSIS — Z6833 Body mass index (BMI) 33.0-33.9, adult: Secondary | ICD-10-CM | POA: Diagnosis not present

## 2013-05-01 DIAGNOSIS — R109 Unspecified abdominal pain: Secondary | ICD-10-CM

## 2013-05-01 DIAGNOSIS — K859 Acute pancreatitis without necrosis or infection, unspecified: Secondary | ICD-10-CM | POA: Diagnosis present

## 2013-05-01 DIAGNOSIS — E119 Type 2 diabetes mellitus without complications: Secondary | ICD-10-CM | POA: Diagnosis present

## 2013-05-01 DIAGNOSIS — Z87891 Personal history of nicotine dependence: Secondary | ICD-10-CM

## 2013-05-01 DIAGNOSIS — K219 Gastro-esophageal reflux disease without esophagitis: Secondary | ICD-10-CM | POA: Diagnosis present

## 2013-05-01 DIAGNOSIS — K929 Disease of digestive system, unspecified: Secondary | ICD-10-CM | POA: Diagnosis present

## 2013-05-01 DIAGNOSIS — I10 Essential (primary) hypertension: Secondary | ICD-10-CM | POA: Diagnosis present

## 2013-05-01 DIAGNOSIS — L299 Pruritus, unspecified: Secondary | ICD-10-CM | POA: Diagnosis present

## 2013-05-01 DIAGNOSIS — M1990 Unspecified osteoarthritis, unspecified site: Secondary | ICD-10-CM | POA: Diagnosis present

## 2013-05-01 DIAGNOSIS — E669 Obesity, unspecified: Secondary | ICD-10-CM | POA: Diagnosis present

## 2013-05-01 DIAGNOSIS — K8020 Calculus of gallbladder without cholecystitis without obstruction: Secondary | ICD-10-CM

## 2013-05-01 DIAGNOSIS — R1013 Epigastric pain: Secondary | ICD-10-CM

## 2013-05-01 DIAGNOSIS — K8309 Other cholangitis: Secondary | ICD-10-CM | POA: Diagnosis present

## 2013-05-01 DIAGNOSIS — E785 Hyperlipidemia, unspecified: Secondary | ICD-10-CM | POA: Diagnosis present

## 2013-05-01 DIAGNOSIS — M069 Rheumatoid arthritis, unspecified: Secondary | ICD-10-CM | POA: Diagnosis present

## 2013-05-01 DIAGNOSIS — K8065 Calculus of gallbladder and bile duct with chronic cholecystitis with obstruction: Secondary | ICD-10-CM | POA: Diagnosis present

## 2013-05-01 DIAGNOSIS — R51 Headache: Secondary | ICD-10-CM | POA: Diagnosis present

## 2013-05-01 DIAGNOSIS — Z96659 Presence of unspecified artificial knee joint: Secondary | ICD-10-CM | POA: Diagnosis not present

## 2013-05-01 DIAGNOSIS — K8061 Calculus of gallbladder and bile duct with cholecystitis, unspecified, with obstruction: Principal | ICD-10-CM | POA: Diagnosis present

## 2013-05-01 DIAGNOSIS — K8010 Calculus of gallbladder with chronic cholecystitis without obstruction: Secondary | ICD-10-CM | POA: Diagnosis present

## 2013-05-01 HISTORY — DX: Type 2 diabetes mellitus without complications: E11.9

## 2013-05-01 HISTORY — DX: Calculus of gallbladder with chronic cholecystitis without obstruction: K80.10

## 2013-05-01 HISTORY — DX: Essential (primary) hypertension: I10

## 2013-05-01 LAB — URINALYSIS, ROUTINE W REFLEX MICROSCOPIC
Bilirubin Urine: NEGATIVE
Glucose, UA: NEGATIVE mg/dL
Hgb urine dipstick: NEGATIVE
Ketones, ur: NEGATIVE mg/dL
Nitrite: NEGATIVE
Protein, ur: NEGATIVE mg/dL
Specific Gravity, Urine: 1.019 (ref 1.005–1.030)
Urobilinogen, UA: 1 mg/dL (ref 0.0–1.0)
pH: 6.5 (ref 5.0–8.0)

## 2013-05-01 LAB — CBC WITH DIFFERENTIAL/PLATELET
Basophils Absolute: 0 10*3/uL (ref 0.0–0.1)
Basophils Relative: 0 % (ref 0–1)
Eosinophils Absolute: 0.1 10*3/uL (ref 0.0–0.7)
Eosinophils Relative: 1 % (ref 0–5)
HCT: 34.4 % — ABNORMAL LOW (ref 36.0–46.0)
Hemoglobin: 11.2 g/dL — ABNORMAL LOW (ref 12.0–15.0)
Lymphocytes Relative: 11 % — ABNORMAL LOW (ref 12–46)
Lymphs Abs: 0.8 10*3/uL (ref 0.7–4.0)
MCH: 31.5 pg (ref 26.0–34.0)
MCHC: 32.6 g/dL (ref 30.0–36.0)
MCV: 96.6 fL (ref 78.0–100.0)
Monocytes Absolute: 0 10*3/uL — ABNORMAL LOW (ref 0.1–1.0)
Monocytes Relative: 1 % — ABNORMAL LOW (ref 3–12)
Neutro Abs: 6.7 10*3/uL (ref 1.7–7.7)
Neutrophils Relative %: 88 % — ABNORMAL HIGH (ref 43–77)
Platelets: 218 10*3/uL (ref 150–400)
RBC: 3.56 MIL/uL — ABNORMAL LOW (ref 3.87–5.11)
RDW: 15.7 % — ABNORMAL HIGH (ref 11.5–15.5)
WBC: 7.7 10*3/uL (ref 4.0–10.5)

## 2013-05-01 LAB — COMPREHENSIVE METABOLIC PANEL
ALT: 45 U/L — ABNORMAL HIGH (ref 0–35)
AST: 61 U/L — ABNORMAL HIGH (ref 0–37)
Albumin: 3.7 g/dL (ref 3.5–5.2)
Alkaline Phosphatase: 259 U/L — ABNORMAL HIGH (ref 39–117)
BUN: 19 mg/dL (ref 6–23)
CO2: 26 mEq/L (ref 19–32)
Calcium: 10.4 mg/dL (ref 8.4–10.5)
Chloride: 100 mEq/L (ref 96–112)
Creatinine, Ser: 0.77 mg/dL (ref 0.50–1.10)
GFR calc Af Amer: >90 mL/min (ref >90)
GFR calc non Af Amer: 84 mL/min — ABNORMAL LOW (ref >90)
Glucose, Bld: 143 mg/dL — ABNORMAL HIGH (ref 70–99)
Potassium: 3.8 mEq/L (ref 3.5–5.1)
Sodium: 139 mEq/L (ref 135–145)
Total Bilirubin: 1.7 mg/dL — ABNORMAL HIGH (ref 0.3–1.2)
Total Protein: 7.3 g/dL (ref 6.0–8.3)

## 2013-05-01 LAB — URINE MICROSCOPIC-ADD ON

## 2013-05-01 LAB — SURGICAL PCR SCREEN: MRSA, PCR: NEGATIVE

## 2013-05-01 LAB — LIPASE, BLOOD: Lipase: 41 U/L (ref 11–59)

## 2013-05-01 MED ORDER — PANTOPRAZOLE SODIUM 40 MG PO TBEC
40.0000 mg | DELAYED_RELEASE_TABLET | Freq: Every day | ORAL | Status: DC
  Administered 2013-05-02 – 2013-05-05 (×3): 40 mg via ORAL
  Filled 2013-05-01 (×4): qty 1

## 2013-05-01 MED ORDER — LOSARTAN POTASSIUM-HCTZ 100-12.5 MG PO TABS: 1.0000 | ORAL_TABLET | Freq: Every day | ORAL | Status: DC

## 2013-05-01 MED ORDER — KCL IN DEXTROSE-NACL 20-5-0.45 MEQ/L-%-% IV SOLN
INTRAVENOUS | Status: DC
  Administered 2013-05-01 – 2013-05-03 (×4): via INTRAVENOUS
  Filled 2013-05-01 (×9): qty 1000

## 2013-05-01 MED ORDER — METOPROLOL TARTRATE 12.5 MG HALF TABLET
12.5000 mg | ORAL_TABLET | Freq: Two times a day (BID) | ORAL | Status: DC
  Administered 2013-05-01 – 2013-05-05 (×7): 12.5 mg via ORAL
  Filled 2013-05-01 (×9): qty 1

## 2013-05-01 MED ORDER — HYDROMORPHONE HCL PF 1 MG/ML IJ SOLN: 0.5000 mg | INTRAMUSCULAR | Status: DC | PRN

## 2013-05-01 MED ORDER — HEPARIN SODIUM (PORCINE) 5000 UNIT/ML IJ SOLN
5000.0000 [IU] | Freq: Three times a day (TID) | INTRAMUSCULAR | Status: DC
  Administered 2013-05-03 – 2013-05-05 (×5): 5000 [IU] via SUBCUTANEOUS
  Filled 2013-05-01 (×14): qty 1

## 2013-05-01 MED ORDER — ACETAMINOPHEN 650 MG RE SUPP: 650.0000 mg | Freq: Four times a day (QID) | RECTAL | Status: DC | PRN

## 2013-05-01 MED ORDER — CEFAZOLIN SODIUM 1-5 GM-% IV SOLN
1.0000 g | Freq: Three times a day (TID) | INTRAVENOUS | Status: DC
  Administered 2013-05-01 – 2013-05-02 (×2): 1 g via INTRAVENOUS
  Filled 2013-05-01 (×2): qty 50

## 2013-05-01 MED ORDER — DIPHENHYDRAMINE HCL 50 MG/ML IJ SOLN: 12.5000 mg | Freq: Four times a day (QID) | INTRAMUSCULAR | Status: DC | PRN

## 2013-05-01 MED ORDER — HYDROMORPHONE HCL PF 1 MG/ML IJ SOLN
1.0000 mg | Freq: Once | INTRAMUSCULAR | Status: AC
  Administered 2013-05-01: 1 mg via INTRAMUSCULAR
  Filled 2013-05-01: qty 1

## 2013-05-01 MED ORDER — CLONIDINE HCL 0.3 MG PO TABS
0.3000 mg | ORAL_TABLET | Freq: Two times a day (BID) | ORAL | Status: DC
  Administered 2013-05-01 – 2013-05-05 (×7): 0.3 mg via ORAL
  Filled 2013-05-01 (×9): qty 1

## 2013-05-01 MED ORDER — ONDANSETRON HCL 4 MG/2ML IJ SOLN
4.0000 mg | Freq: Four times a day (QID) | INTRAMUSCULAR | Status: DC | PRN
  Administered 2013-05-04: 4 mg via INTRAVENOUS
  Filled 2013-05-01: qty 2

## 2013-05-01 MED ORDER — ONDANSETRON 4 MG PO TBDP
4.0000 mg | ORAL_TABLET | Freq: Once | ORAL | Status: AC
  Administered 2013-05-01: 4 mg via ORAL
  Filled 2013-05-01: qty 1

## 2013-05-01 MED ORDER — ESTRADIOL 1 MG PO TABS
0.5000 mg | ORAL_TABLET | Freq: Two times a day (BID) | ORAL | Status: DC
  Administered 2013-05-01 – 2013-05-05 (×7): 0.5 mg via ORAL
  Filled 2013-05-01 (×9): qty 0.5

## 2013-05-01 MED ORDER — ATORVASTATIN CALCIUM 40 MG PO TABS
40.0000 mg | ORAL_TABLET | Freq: Every day | ORAL | Status: DC
  Administered 2013-05-02 – 2013-05-05 (×3): 40 mg via ORAL
  Filled 2013-05-01 (×4): qty 1

## 2013-05-01 MED ORDER — DIPHENHYDRAMINE HCL 12.5 MG/5ML PO ELIX: 12.5000 mg | ORAL_SOLUTION | Freq: Four times a day (QID) | ORAL | Status: DC | PRN

## 2013-05-01 MED ORDER — AMLODIPINE BESYLATE 5 MG PO TABS
5.0000 mg | ORAL_TABLET | Freq: Every day | ORAL | Status: DC
  Administered 2013-05-02 – 2013-05-05 (×3): 5 mg via ORAL
  Filled 2013-05-01 (×4): qty 1

## 2013-05-01 MED ORDER — HYDROCHLOROTHIAZIDE 12.5 MG PO CAPS
12.5000 mg | ORAL_CAPSULE | Freq: Every day | ORAL | Status: DC
  Administered 2013-05-02: 12.5 mg via ORAL
  Filled 2013-05-01 (×2): qty 1

## 2013-05-01 MED ORDER — ONDANSETRON HCL 4 MG/2ML IJ SOLN
4.0000 mg | Freq: Once | INTRAMUSCULAR | Status: DC
  Filled 2013-05-01: qty 2

## 2013-05-01 MED ORDER — LOSARTAN POTASSIUM 50 MG PO TABS
100.0000 mg | ORAL_TABLET | Freq: Every day | ORAL | Status: DC
  Administered 2013-05-02: 100 mg via ORAL
  Filled 2013-05-01 (×2): qty 2

## 2013-05-01 MED ORDER — ACETAMINOPHEN 325 MG PO TABS
650.0000 mg | ORAL_TABLET | Freq: Four times a day (QID) | ORAL | Status: DC | PRN
  Administered 2013-05-01 – 2013-05-03 (×3): 650 mg via ORAL
  Filled 2013-05-01 (×3): qty 2

## 2013-05-01 MED ORDER — OXYCODONE HCL 5 MG PO TABS
5.0000 mg | ORAL_TABLET | ORAL | Status: DC | PRN
  Administered 2013-05-02 – 2013-05-04 (×2): 10 mg via ORAL
  Administered 2013-05-05: 5 mg via ORAL
  Administered 2013-05-05 (×2): 10 mg via ORAL
  Filled 2013-05-01 (×2): qty 2
  Filled 2013-05-01: qty 1
  Filled 2013-05-01 (×2): qty 2

## 2013-05-01 NOTE — ED Notes (Signed)
MD at bedside. 

## 2013-05-01 NOTE — ED Notes (Signed)
Pt c/o epigastric pain and mid back pain since this morning.  Pain score 10/10.  Pt sts intermittent pain and nausea for "about a week, but pain is much worse today."  Sts pain starts after she eats.  Sts she was seen for same complaint "a couple years ago" and was told that it could be her gallbladder.

## 2013-05-01 NOTE — ED Provider Notes (Addendum)
CSN: [id number]     Arrival date & time 05/01/13  1226 History   First MD Initiated Contact with Patient 05/01/13 1340     Chief Complaint  Patient presents with  . Abdominal Pain  . Back Pain   (Consider location/radiation/quality/duration/timing/severity/associated sxs/prior Treatment) HPI  67 year old female with abdominal pain. Epigastric region with radiation to her back. Patient has had similar intermittent pain over the past 2 years. Has become more frequent and more severe within the past week. Today patient was at work and began shortly after eating lunch. Feels like a pressure in her upper abdomen with occasional sharper pain. Nausea. No vomiting. No fevers or chills. No chest pain or shortness of breath. No appreciable exacerbating or relieving factors. Tried taking extra strength tyelnol earlier today with little relief.   Past Medical History  Diagnosis Date  . Hypertension   . Normal cardiac stress test     pt can't remember when or where  . Diabetes mellitus without complication     Type 2 NIDDM x 15 years  . Arthritis     osteoarthritis  . Hyperlipidemia   . Hormone replacement therapy (postmenopausal)   . Fatty liver disease, nonalcoholic 2010    hospitalized, MRCP dx fatty liver  . Rheumatoid arthritis(714.0) 09/28/2012  . Obesity, unspecified 12/27/2012  . GERD (gastroesophageal reflux disease)    Past Surgical History  Procedure Laterality Date  . Joint replacement      left knee  . Abdominal hysterectomy    . Tonsillectomy    . Nasal sinus surgery    . 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 Total knee arthroplasty  08/01/2012    RIGHT  KNEE   Family History  Problem Relation Age of Onset  . Diabetes Other   . Heart disease Other    History  Substance Use Topics  . Smoking status: Former  Smoker    Types: Cigarettes  . Smokeless tobacco: Never Used     Comment: quit smoking regularly in her 30's; now has an occasional cigarette  . Alcohol Use: Yes     Comment: occasional   OB History   Grav Para Term Preterm Abortions TAB SAB Ect Mult Living                 Review of Systems  All systems reviewed and negative, other than as noted in HPI.  Allergies  Monopril  Home Medications   Current Outpatient Rx  Name  Route  Sig  Dispense  Refill  . amLODipine (NORVASC) 5 MG tablet   Oral   Take 1 tablet (5 mg total) by mouth daily.   90 tablet   3   . aspirin EC 81 MG tablet   Oral   Take 81 mg by mouth daily.         Marland Kitchen atorvastatin (LIPITOR) 40 MG tablet   Oral   Take 40 mg by mouth daily.         . Calcium Carbonate-Vitamin D (CALCIUM 600 + D PO)   Oral   Take 1 tablet by mouth 2 (two) times daily.         . cloNIDine (CATAPRES) 0.3 MG tablet   Oral   Take 1 tablet (0.3 mg total) by mouth 2 (two) times daily.   180 tablet  3   . estradiol (ESTRACE) 0.5 MG tablet   Oral   Take 0.5 mg by mouth 2 (two) times daily.         . fish oil-omega-3 fatty acids 1000 MG capsule   Oral   Take 1 g by mouth at bedtime.          . folic acid (FOLVITE) 1 MG tablet   Oral   Take 1 tablet by mouth at bedtime.          Marland Kitchen losartan-hydrochlorothiazide (HYZAAR) 100-12.5 MG per tablet   Oral   Take 1 tablet by mouth daily.   90 tablet   3   . metFORMIN (GLUCOPHAGE) 1000 MG tablet      TAKE ONE TABLET BY MOUTH TWICE DAILY   180 tablet   0   . methotrexate (RHEUMATREX) 2.5 MG tablet   Oral   Take 8 tablets by mouth once a week.         . metoprolol tartrate (LOPRESSOR) 25 MG tablet   Oral   Take 12.5 mg by mouth 2 (two) times daily.         . Multiple Vitamins-Minerals (MULTIVITAMINS THER. W/MINERALS) TABS   Oral   Take 1 tablet by mouth daily.         Marland Kitchen omeprazole (PRILOSEC) 20 MG capsule   Oral   Take 1 capsule (20 mg total) by  mouth daily.   90 capsule   3    BP 170/56  Pulse 52  Temp(Src) 98.6 F (37 C) (Oral)  Resp 18  Ht 5' (1.524 m)  Wt 170 lb (77.111 kg)  BMI 33.2 kg/m2  SpO2 100% Physical Exam  Nursing note and vitals reviewed. Constitutional: She appears well-developed and well-nourished. No distress.  HENT:  Head: Normocephalic and atraumatic.  Eyes: Conjunctivae are normal. Right eye exhibits no discharge. Left eye exhibits no discharge.  Neck: Neck supple.  Cardiovascular: Normal rate, regular rhythm and normal heart sounds.  Exam reveals no gallop and no friction rub.   No murmur heard. Pulmonary/Chest: Effort normal and breath sounds normal. No respiratory distress.  Abdominal: Soft. She exhibits no distension. There is tenderness. There is guarding. There is no rebound.  Epigastric and RUQ tenderness with voluntary guarding. No rebound. No distension.   Genitourinary:  No cva tenderness  Musculoskeletal: She exhibits no edema and no tenderness.  Neurological: She is alert.  Skin: Skin is warm and dry.  Psychiatric: She has a normal mood and affect. Her behavior is normal. Thought content normal.    ED Course  Procedures (including critical care time) Labs Review Labs Reviewed  CBC WITH DIFFERENTIAL - Abnormal; Notable for the following:    RBC 3.56 (*)    Hemoglobin 11.2 (*)    HCT 34.4 (*)    RDW 15.7 (*)    Neutrophils Relative % 88 (*)    Lymphocytes Relative 11 (*)    Monocytes Relative 1 (*)    Monocytes Absolute 0.0 (*)    All other components within normal limits  COMPREHENSIVE METABOLIC PANEL - Abnormal; Notable for the following:    Glucose, Bld 143 (*)    AST 61 (*)    ALT 45 (*)    Alkaline Phosphatase 259 (*)    Total Bilirubin 1.7 (*)    GFR calc non Af Amer 84 (*)    All other components within normal limits  LIPASE, BLOOD  URINALYSIS, ROUTINE W REFLEX MICROSCOPIC   Imaging Review US  Abdomen Complete  05/01/2013   CLINICAL DATA:  Abdominal pain.  EXAM:  ABDOMEN ULTRASOUND  COMPARISON:  Seven hundred fourteen and  FINDINGS: Gallbladder  At least 1 gallstone is seen measuring 9 mm. No evidence of gallbladder wall thickening or sonographic Murphy's sign.  Common bile duct  Diameter: 12 mm, without significant change since previous study. Distal common bile duct not well visualized.  Liver  No focal lesion identified. Within normal limits in parenchymal echogenicity.  IVC  No abnormality visualized.  Pancreas  Visualized portion unremarkable.  Spleen  Size and appearance within normal limits.  Right Kidney  Length: 11.3 cm. Echogenicity within normal limits. Simple cyst in midpole measuring 1.9 cm, and larger simple cyst in upper pole measuring 7.8 cm. No mass or hydronephrosis visualized.  Left Kidney  Length: 10.9 cm. Echogenicity within normal limits. No mass or hydronephrosis visualized.  Abdominal aorta  No aneurysm visualized.  IMPRESSION: Cholelithiasis, without sonographic signs of acute cholecystitis.  Stable biliary dilatation, with common bile duct measuring 12 mm. Etiology not visualized by ultrasound. Recommend correlation with liver function tests, and consider MRCP for further imaging evaluation clinically warranted.  Right renal cysts again noted. No evidence of renal mass or hydronephrosis.   Electronically Signed   By: Myles Rosenthal   On: 05/01/2013 16:00    MDM   1. Abdominal pain   2. Cholelithiasis     68 year old female with abdominal pain. Symptoms consistent with biliary colic. Ultrasound confirms the presence of a gallstone without evidence of cholecystitis sonographically. Common bile duct is mildly dilated. This was noted on ultrasound several years ago though. LFTs are elevated. Total bilirubin is elevated at 1.7. Normal on labs 1 month ago. Pain has improved, but patient still remains symptomatic. Case was discussed with surgery for consultation. Pt reports being seen by Dr Loreta Ave years ago for colonoscopy. Does not think specifically  ever seen her for this issue.     Raeford Razor, MD 05/01/13 1642  Raeford Razor, MD 05/01/13 [phone]

## 2013-05-01 NOTE — ED Notes (Addendum)
Ultrasound at bedside

## 2013-05-01 NOTE — ED Notes (Signed)
2nd RN attempted IV start w/o success.  3rd RN asked to attempt.

## 2013-05-01 NOTE — H&P (Signed)
[name] is an 67 y.o. female.   Chief Complaint: Abdominal Pain PCP: Frazier Richards, PA-C HPI: 67 year old female with abdominal pain. Epigastric region with radiation to her back. Patient has had similar intermittent pain over the past 2 years. Has become more frequent and more severe within the past week. Today patient's pain started  shortly after eating breakfast. Feels like a pressure in her upper abdomen with occasional sharper pain. Some Nausea on and off, one episode of vomiting. No fevers or chills. No chest pain or shortness of breath. No appreciable exacerbating or relieving factors, comes on with or without foods, no particular food sets it off.  Tired of missing work, 2-3 episodes since last week, several episodes per month on and off for 2 years.No pain relief till she was treated in the ER here today. Work up in the ER shows normal WBC, mild LFT elevation and 9mm gallstone, no cholecystitis, 12 mm CBD, this was previously elevated back in 02/2009.  W/u at that time showed esophagitis, and distal esophageal stricture.  Symptoms of GERD well controlled now with PPI.   Past Medical History  Diagnosis Date  . Hypertension   . Normal cardiac stress test About 2006, she cannot remember who did study, not found in EPIC.    pt can't remember when or where  . Diabetes mellitus without complication     Type 2 NIDDM x 15 years  . Arthritis     osteoarthritis  . Hyperlipidemia   . Hormone replacement therapy (postmenopausal)   . Fatty liver disease, nonalcoholic 2010    hospitalized, MRCP dx fatty liver  . Rheumatoid arthritis(714.0) 09/28/2012  . Obesity, unspecified 12/27/2012  . GERD (gastroesophageal reflux disease)     Past Surgical History  Procedure Laterality Date  . Joint replacement  01/2002    left knee  . Abdominal hysterectomy    . Tonsillectomy    . Nasal sinus surgery    . Total knee arthroplasty  08/01/2012    Procedure: TOTAL KNEE ARTHROPLASTY;  Surgeon: Cammy Copa, MD;  Location: [facility] OR;  Service: Orthopedics;  Laterality: Right;  Right total knee arthroplasty  . Total knee arthroplasty  08/01/2012    RIGHT  KNEE    Family History  Problem Relation Age of Onset  . Diabetes Other   . Heart disease Other    Social History:  reports that she has quit smoking. Her smoking use included Cigarettes. She smoked 0.00 packs per day. She has never used smokeless tobacco. She reports that  drinks alcohol. She reports that she does not use illicit drugs. Tobacco:  About 10 years on and off, none since age 14. ETOH: none Drugs: none Works full time, daughter and family live with her.  Allergies:  Allergies  Allergen Reactions  . Monopril [Fosinopril] Cough   Prior to Admission medications   Medication Sig Start Date End Date Taking? Authorizing Provider  amLODipine (NORVASC) 5 MG tablet Take 1 tablet (5 mg total) by mouth daily. 01/16/13  Yes Dorena Bodo, PA-C  aspirin EC 81 MG tablet Take 81 mg by mouth daily.   Yes Historical Provider, MD  atorvastatin (LIPITOR) 40 MG tablet Take 40 mg by mouth daily.   Yes Historical Provider, MD  Calcium Carbonate-Vitamin D (CALCIUM 600 + D PO) Take 1 tablet by mouth 2 (two) times daily.   Yes Historical Provider, MD  cloNIDine (CATAPRES) 0.3 MG tablet Take 1 tablet (0.3 mg total) by mouth 2 (two) times  daily. 03/29/13  Yes Dorena Bodo, PA-C  estradiol (ESTRACE) 0.5 MG tablet Take 0.5 mg by mouth 2 (two) times daily.   Yes Historical Provider, MD  fish oil-omega-3 fatty acids 1000 MG capsule Take 1 g by mouth at bedtime.    Yes Historical Provider, MD  folic acid (FOLVITE) 1 MG tablet Take 1 tablet by mouth at bedtime.  12/08/12  Yes Historical Provider, MD  losartan-hydrochlorothiazide (HYZAAR) 100-12.5 MG per tablet Take 1 tablet by mouth daily. 01/16/13  Yes Mary B Dixon, PA-C  metFORMIN (GLUCOPHAGE) 1000 MG tablet TAKE ONE TABLET BY MOUTH TWICE DAILY 01/23/13  Yes Dorena Bodo, PA-C  methotrexate (RHEUMATREX)  2.5 MG tablet Take 8 tablets by mouth once a week. 12/08/12  Yes Historical Provider, MD  metoprolol tartrate (LOPRESSOR) 25 MG tablet Take 12.5 mg by mouth 2 (two) times daily.   Yes Historical Provider, MD  Multiple Vitamins-Minerals (MULTIVITAMINS THER. W/MINERALS) TABS Take 1 tablet by mouth daily.   Yes Historical Provider, MD  omeprazole (PRILOSEC) 20 MG capsule Take 1 capsule (20 mg total) by mouth daily. 03/29/13  Yes Dorena Bodo, PA-C     (Not in a hospital admission)  Results for orders placed during the hospital encounter of 05/01/13 (from the past 48 hour(s))  CBC WITH DIFFERENTIAL     Status: Abnormal   Collection Time    05/01/13  3:10 PM      Result Value Range   WBC 7.7  4.0 - 10.5 K/uL   RBC 3.56 (*) 3.87 - 5.11 MIL/uL   Hemoglobin 11.2 (*) 12.0 - 15.0 g/dL   HCT 16.1 (*) 09.6 - 04.5 %   MCV 96.6  78.0 - 100.0 fL   MCH 31.5  26.0 - 34.0 pg   MCHC 32.6  30.0 - 36.0 g/dL   RDW 40.9 (*) 81.1 - 91.4 %   Platelets 218  150 - 400 K/uL   Neutrophils Relative % 88 (*) 43 - 77 %   Neutro Abs 6.7  1.7 - 7.7 K/uL   Lymphocytes Relative 11 (*) 12 - 46 %   Lymphs Abs 0.8  0.7 - 4.0 K/uL   Monocytes Relative 1 (*) 3 - 12 %   Monocytes Absolute 0.0 (*) 0.1 - 1.0 K/uL   Eosinophils Relative 1  0 - 5 %   Eosinophils Absolute 0.1  0.0 - 0.7 K/uL   Basophils Relative 0  0 - 1 %   Basophils Absolute 0.0  0.0 - 0.1 K/uL  COMPREHENSIVE METABOLIC PANEL     Status: Abnormal   Collection Time    05/01/13  3:10 PM      Result Value Range   Sodium 139  135 - 145 mEq/L   Potassium 3.8  3.5 - 5.1 mEq/L   Chloride 100  96 - 112 mEq/L   CO2 26  19 - 32 mEq/L   Glucose, Bld 143 (*) 70 - 99 mg/dL   BUN 19  6 - 23 mg/dL   Creatinine, Ser 7.82  0.50 - 1.10 mg/dL   Calcium 95.6  8.4 - 21.3 mg/dL   Total Protein 7.3  6.0 - 8.3 g/dL   Albumin 3.7  3.5 - 5.2 g/dL   AST 61 (*) 0 - 37 U/L   ALT 45 (*) 0 - 35 U/L   Alkaline Phosphatase 259 (*) 39 - 117 U/L   Total Bilirubin 1.7 (*) 0.3 - 1.2  mg/dL   GFR calc non Af Denyse Dago  84 (*) >90 mL/min   GFR calc Af Amer >90  >90 mL/min   Comment: (NOTE)     The eGFR has been calculated using the CKD EPI equation.     This calculation has not been validated in all clinical situations.     eGFR's persistently <90 mL/min signify possible Chronic Kidney     Disease.  LIPASE, BLOOD     Status: None   Collection Time    05/01/13  3:10 PM      Result Value Range   Lipase 41  11 - 59 U/L  URINALYSIS, ROUTINE W REFLEX MICROSCOPIC     Status: Abnormal   Collection Time    05/01/13  4:23 PM      Result Value Range   Color, Urine YELLOW  YELLOW   APPearance CLEAR  CLEAR   Specific Gravity, Urine 1.019  1.005 - 1.030   pH 6.5  5.0 - 8.0   Glucose, UA NEGATIVE  NEGATIVE mg/dL   Hgb urine dipstick NEGATIVE  NEGATIVE   Bilirubin Urine NEGATIVE  NEGATIVE   Ketones, ur NEGATIVE  NEGATIVE mg/dL   Protein, ur NEGATIVE  NEGATIVE mg/dL   Urobilinogen, UA 1.0  0.0 - 1.0 mg/dL   Nitrite NEGATIVE  NEGATIVE   Leukocytes, UA TRACE (*) NEGATIVE  URINE MICROSCOPIC-ADD ON     Status: None   Collection Time    05/01/13  4:23 PM      Result Value Range   Squamous Epithelial / LPF RARE  RARE   US Abdomen Complete  05/01/2013   CLINICAL DATA:  Abdominal pain.  EXAM: ABDOMEN ULTRASOUND  COMPARISON:  Seven hundred fourteen and  FINDINGS: Gallbladder  At least 1 gallstone is seen measuring 9 mm. No evidence of gallbladder wall thickening or sonographic Murphy's sign.  Common bile duct  Diameter: 12 mm, without significant change since previous study. Distal common bile duct not well visualized.  Liver  No focal lesion identified. Within normal limits in parenchymal echogenicity.  IVC  No abnormality visualized.  Pancreas  Visualized portion unremarkable.  Spleen  Size and appearance within normal limits.  Right Kidney  Length: 11.3 cm. Echogenicity within normal limits. Simple cyst in midpole measuring 1.9 cm, and larger simple cyst in upper pole measuring 7.8 cm. No  mass or hydronephrosis visualized.  Left Kidney  Length: 10.9 cm. Echogenicity within normal limits. No mass or hydronephrosis visualized.  Abdominal aorta  No aneurysm visualized.  IMPRESSION: Cholelithiasis, without sonographic signs of acute cholecystitis.  Stable biliary dilatation, with common bile duct measuring 12 mm. Etiology not visualized by ultrasound. Recommend correlation with liver function tests, and consider MRCP for further imaging evaluation clinically warranted.  Right renal cysts again noted. No evidence of renal mass or hydronephrosis.   Electronically Signed   By: Myles Rosenthal   On: 05/01/2013 16:00    Review of Systems  Constitutional: Positive for chills. Negative for fever, weight loss, malaise/fatigue and diaphoresis.  HENT: Negative.   Eyes: Negative.   Respiratory: Negative.   Cardiovascular: Positive for leg swelling.  Gastrointestinal: Positive for heartburn (ok on ppi), nausea, vomiting, abdominal pain (mid epigastric going to back) and diarrhea (some this am, but not normally). Negative for constipation, blood in stool and melena.  Genitourinary: Negative.   Musculoskeletal: Negative.        Stable on current meds  Skin: Negative.   Neurological: Negative.  Negative for weakness.  Endo/Heme/Allergies: Negative.   Psychiatric/Behavioral: Negative.  Blood pressure 141/65, pulse 77, temperature 98.6 F (37 C), temperature source Oral, resp. rate 18, height 5' (1.524 m), weight 77.111 kg (170 lb), SpO2 96.00%. Physical Exam  Constitutional: She appears well-developed and well-nourished. No distress.  HENT:  Head: Normocephalic and atraumatic.  Nose: Nose normal.  Eyes: Conjunctivae and EOM are normal. Pupils are equal, round, and reactive to light. Right eye exhibits no discharge. Left eye exhibits no discharge.  Neck: Normal range of motion. Neck supple. No JVD present. No tracheal deviation present. No thyromegaly present.  Cardiovascular: Normal rate,  regular rhythm, normal heart sounds and intact distal pulses.  Exam reveals no gallop.   No murmur heard. Respiratory: Breath sounds normal. No respiratory distress. She has no wheezes. She has no rales. She exhibits no tenderness.  GI: Soft. Bowel sounds are normal. She exhibits no distension. There is tenderness.  Lymphadenopathy:    She has no cervical adenopathy.  Neurological: Abnormal coordination: she is a little tender mid epigastric and RUQ.  Skin: Skin is warm and dry. No rash noted. She is not diaphoretic. No erythema. No pallor.  Psychiatric: She has a normal mood and affect. Her behavior is normal. Thought content normal.     Assessment/Plan 1. Cholelithiasis with obstruction 2. Hypertension 3. Osteoarthritis, s/p bilateral knee replacements 4. Rheumatoid arthritis on Methotrexate 5. AODM type II 6.  Dyslipidemia 7.  Hx of GERD, esophageal stricture and esophagitis 2010. EGD Dr. Russella Dar      Colonoscopy Dr. Loreta Ave 2012 8.  Hyperlipidemia 9.  Body mass index is 33.2   Plan:  Clear liquids, hydrate and Ancef tonight.  NPO after MN, recheck labs in AM.  If LFT's are up call Dr. Loreta Ave, if they are better tentatively plan cholecystectomy in AM.     Anikin Prosser 05/01/2013, 5:29 PM

## 2013-05-01 NOTE — Progress Notes (Signed)
Patient received from the ED via wheelchair, alert and oriented.  No complaints of pain at this time.  IV in place located left upper bicep, fluids ordered.  CBG = 83.  Family sitting with patient, clear liquids ordered.

## 2013-05-01 NOTE — ED Notes (Signed)
This RN unsuccessfully attempted to start an IV.  Asked another RN to attempt.

## 2013-05-02 ENCOUNTER — Observation Stay (HOSPITAL_COMMUNITY): Payer: BC Managed Care – PPO

## 2013-05-02 ENCOUNTER — Encounter (HOSPITAL_COMMUNITY): Payer: Self-pay | Admitting: *Deleted

## 2013-05-02 ENCOUNTER — Encounter (HOSPITAL_COMMUNITY): Admission: EM | Disposition: A | Discharge status: Home / Self Care | Payer: Self-pay | Source: Home / Self Care

## 2013-05-02 DIAGNOSIS — K8061 Calculus of gallbladder and bile duct with cholecystitis, unspecified, with obstruction: Secondary | ICD-10-CM | POA: Diagnosis not present

## 2013-05-02 DIAGNOSIS — R1013 Epigastric pain: Secondary | ICD-10-CM | POA: Diagnosis not present

## 2013-05-02 HISTORY — PX: SPHINCTEROTOMY: SHX5544

## 2013-05-02 HISTORY — PX: ERCP: SHX5425

## 2013-05-02 LAB — CBC
MCH: 31.6 pg (ref 26.0–34.0)
MCHC: 32.9 g/dL (ref 30.0–36.0)
Platelets: 177 10*3/uL (ref 150–400)
RBC: 3.35 MIL/uL — ABNORMAL LOW (ref 3.87–5.11)

## 2013-05-02 LAB — LIPASE, BLOOD: Lipase: 15 U/L (ref 11–59)

## 2013-05-02 LAB — COMPREHENSIVE METABOLIC PANEL
AST: 67 U/L — ABNORMAL HIGH (ref 0–37)
CO2: 26 mEq/L (ref 19–32)
Chloride: 100 mEq/L (ref 96–112)
Creatinine, Ser: 0.81 mg/dL (ref 0.50–1.10)
GFR calc non Af Amer: 73 mL/min — ABNORMAL LOW (ref >90)
Total Bilirubin: 3.5 mg/dL — ABNORMAL HIGH (ref 0.3–1.2)

## 2013-05-02 SURGERY — ERCP, WITH INTERVENTION IF INDICATED
Anesthesia: Moderate Sedation

## 2013-05-02 MED ORDER — DIPHENHYDRAMINE HCL 50 MG/ML IJ SOLN
INTRAMUSCULAR | Status: AC
  Filled 2013-05-02: qty 1

## 2013-05-02 MED ORDER — FENTANYL CITRATE 0.05 MG/ML IJ SOLN
INTRAMUSCULAR | Status: AC
  Filled 2013-05-02: qty 4

## 2013-05-02 MED ORDER — GLUCAGON HCL (RDNA) 1 MG IJ SOLR
INTRAMUSCULAR | Status: AC
  Filled 2013-05-02: qty 2

## 2013-05-02 MED ORDER — MIDAZOLAM HCL 10 MG/2ML IJ SOLN
INTRAMUSCULAR | Status: DC | PRN
  Administered 2013-05-02 (×3): 2 mg via INTRAVENOUS
  Administered 2013-05-02: 1 mg via INTRAVENOUS
  Administered 2013-05-02 (×2): 2 mg via INTRAVENOUS

## 2013-05-02 MED ORDER — CIPROFLOXACIN IN D5W 400 MG/200ML IV SOLN
400.0000 mg | Freq: Two times a day (BID) | INTRAVENOUS | Status: DC
  Administered 2013-05-02 – 2013-05-04 (×5): 400 mg via INTRAVENOUS
  Filled 2013-05-02 (×7): qty 200

## 2013-05-02 MED ORDER — CHLORHEXIDINE GLUCONATE 4 % EX LIQD
1.0000 "application " | Freq: Once | CUTANEOUS | Status: AC
  Administered 2013-05-02: 1 via TOPICAL
  Filled 2013-05-02: qty 15

## 2013-05-02 MED ORDER — MIDAZOLAM HCL 10 MG/2ML IJ SOLN
INTRAMUSCULAR | Status: AC
  Filled 2013-05-02: qty 4

## 2013-05-02 MED ORDER — FENTANYL CITRATE 0.05 MG/ML IJ SOLN
INTRAMUSCULAR | Status: DC | PRN
  Administered 2013-05-02 (×4): 25 ug via INTRAVENOUS

## 2013-05-02 MED ORDER — SODIUM CHLORIDE 0.9 % IV SOLN: INTRAVENOUS | Status: DC

## 2013-05-02 MED ORDER — SODIUM CHLORIDE 0.9 % IV SOLN
INTRAVENOUS | Status: DC | PRN
  Administered 2013-05-02: 13:00:00

## 2013-05-02 MED ORDER — GLUCAGON HCL (RDNA) 1 MG IJ SOLR
INTRAMUSCULAR | Status: DC | PRN
  Administered 2013-05-02 (×2): .5 mg via INTRAVENOUS

## 2013-05-02 MED ORDER — CHLORHEXIDINE GLUCONATE 4 % EX LIQD
1.0000 "application " | Freq: Once | CUTANEOUS | Status: AC
  Administered 2013-05-03: 1 via TOPICAL
  Filled 2013-05-02: qty 15

## 2013-05-02 MED ORDER — CIPROFLOXACIN IN D5W 400 MG/200ML IV SOLN
INTRAVENOUS | Status: AC
  Filled 2013-05-02: qty 200

## 2013-05-02 NOTE — Op Note (Signed)
[facility] [address] [place], [address]   ERCP PROCEDURE REPORT  PATIENT: [name], [name]  MR# :[id number] BIRTHDATE: 1946/01/15  GENDER: Female ENDOSCOPIST: Jeani Hawking, MD REFERRED BY: PROCEDURE DATE:  05/02/2013 PROCEDURE:   ERCP with removal of calculus/calculi ASA CLASS:   Class III INDICATIONS:suspected or rule out bile duct stones. MEDICATIONS: Versed 11 mg IV, Fentanyl 100 mcg IV, and Glucagon 1 mg IV TOPICAL ANESTHETIC: none  DESCRIPTION OF PROCEDURE:   After the risks benefits and alternatives of the procedure were thoroughly explained, informed consent was obtained.  The [employer]ax ERCP [id number]  endoscope was introduced through the mouth  and advanced to the second portion of the duodenum . The ampulla was located and it was adjacent to a diverticulum.  Cannulation of the CBD occurred with the first attempt.  No PD cannulation occurred.  The guidewire was secured in the right intrahepatic ducts.  Contrast injection revealed multiple filling defects in the distal CBD.  A 5 mm sphincterotomy was created.  A larger sphincterotomy was not possible as the line of cut was going towards the diverticulum.  As a result, I performed a sphincteroplasty with a 10 mm x 40 mm dilating balloon.  The dilation balloon was held in place for 20-30 seconds.  Some bleeding occurred after the balloon was deflated, but it stopped spontaneously.  subsequently multiple stones fell out spontaneously along with pus.  Difficult was encountered with recannulation of the CBD with a dilating balloon.  After multiple attempts and using a second balloon, the extraction balloon was advanced to the proximal CBD.  Four sweeps of the CBD were performed and a large eliptical stone was extracted during the first pass.  Additionally, a copious amount of pus extruded from the CBD.  The final occlusion cholangiogram was negative for any retained stones.  The scope was then completely  withdrawn from the patient and the procedure terminated.     COMPLICATIONS:  ENDOSCOPIC IMPRESSION: 1) Choledocholithiasis s/p successful extraction. 2) Cholangitis.  RECOMMENDATIONS: 1) Continue with Cipro. 2) Clear liquid diet and NPO after midnight. 3) Cholecystectomy per Surgery.    _______________________________ eSignedJeani Hawking, MD 05/02/2013 1:05 PM   CC:  PATIENT NAME:  [name], [name] MR#: [id number]

## 2013-05-02 NOTE — H&P (Signed)
Patient seen with WJ,PA nd agree with his assessment and plans as outlined. Discussed surgery with the patient.

## 2013-05-02 NOTE — Progress Notes (Addendum)
Patient c/o pain at IV site in left upper arm, after assessment of arm, noticed to be very edematous, painful to touch with fluid blisters forming around IV site, pinkish reddish in color, warm to touch, IV had become infiltrated on night time and was not reported to day time staff, NP notified, stated he would come and assess arm, ice pack applied and arm elevated on a pillow, patient in stable condition at this time, will continue to monitor arm during shift

## 2013-05-02 NOTE — Progress Notes (Signed)
<principal problem not specified>  Assessment: LFT'S up a bit today; suspect her sx are from CBD stone.  Plan: Will get GI consult today for possible ercp. Will need lap chole at some point as well. Discussed plans with patient and she understands Wbc up,on antibiotics   Subjective: Feels OK, not much pain overnight,   Objective: Vital signs in last 24 hours: Temp:  [98.5 F (36.9 C)-100.4 F (38 C)] 99.5 F (37.5 C) (09/10 0642) Pulse Rate:  [52-82] 73 (09/10 0642) Resp:  [18] 18 (09/10 0642) BP: (141-170)/(56-85) 144/71 mmHg (09/10 0642) SpO2:  [92 %-100 %] 92 % (09/10 0642) Weight:  [170 lb (77.111 kg)] 170 lb (77.111 kg) (09/09 1316) Last BM Date: 05/01/13  Intake/Output from previous day: 09/09 0701 - 09/10 0700 In: 1230 [P.O.:240; I.V.:890; IV Piggyback:100] Out: 650 [Urine:650]  General appearance: alert, cooperative and no distress Resp: clear to auscultation bilaterally GI: Soft, very mild epigastric tenderness  Lab Results:  Results for orders placed during the hospital encounter of 05/01/13 (from the past 24 hour(s))  CBC WITH DIFFERENTIAL     Status: Abnormal   Collection Time    05/01/13  3:10 PM      Result Value Range   WBC 7.7  4.0 - 10.5 K/uL   RBC 3.56 (*) 3.87 - 5.11 MIL/uL   Hemoglobin 11.2 (*) 12.0 - 15.0 g/dL   HCT 29.5 (*) 62.1 - 30.8 %   MCV 96.6  78.0 - 100.0 fL   MCH 31.5  26.0 - 34.0 pg   MCHC 32.6  30.0 - 36.0 g/dL   RDW 65.7 (*) 84.6 - 96.2 %   Platelets 218  150 - 400 K/uL   Neutrophils Relative % 88 (*) 43 - 77 %   Neutro Abs 6.7  1.7 - 7.7 K/uL   Lymphocytes Relative 11 (*) 12 - 46 %   Lymphs Abs 0.8  0.7 - 4.0 K/uL   Monocytes Relative 1 (*) 3 - 12 %   Monocytes Absolute 0.0 (*) 0.1 - 1.0 K/uL   Eosinophils Relative 1  0 - 5 %   Eosinophils Absolute 0.1  0.0 - 0.7 K/uL   Basophils Relative 0  0 - 1 %   Basophils Absolute 0.0  0.0 - 0.1 K/uL  COMPREHENSIVE METABOLIC PANEL     Status: Abnormal   Collection Time    05/01/13   3:10 PM      Result Value Range   Sodium 139  135 - 145 mEq/L   Potassium 3.8  3.5 - 5.1 mEq/L   Chloride 100  96 - 112 mEq/L   CO2 26  19 - 32 mEq/L   Glucose, Bld 143 (*) 70 - 99 mg/dL   BUN 19  6 - 23 mg/dL   Creatinine, Ser 9.52  0.50 - 1.10 mg/dL   Calcium 84.1  8.4 - 32.4 mg/dL   Total Protein 7.3  6.0 - 8.3 g/dL   Albumin 3.7  3.5 - 5.2 g/dL   AST 61 (*) 0 - 37 U/L   ALT 45 (*) 0 - 35 U/L   Alkaline Phosphatase 259 (*) 39 - 117 U/L   Total Bilirubin 1.7 (*) 0.3 - 1.2 mg/dL   GFR calc non Af Amer 84 (*) >90 mL/min   GFR calc Af Amer >90  >90 mL/min  LIPASE, BLOOD     Status: None   Collection Time    05/01/13  3:10 PM      Result  Value Range   Lipase 41  11 - 59 U/L  URINALYSIS, ROUTINE W REFLEX MICROSCOPIC     Status: Abnormal   Collection Time    05/01/13  4:23 PM      Result Value Range   Color, Urine YELLOW  YELLOW   APPearance CLEAR  CLEAR   Specific Gravity, Urine 1.019  1.005 - 1.030   pH 6.5  5.0 - 8.0   Glucose, UA NEGATIVE  NEGATIVE mg/dL   Hgb urine dipstick NEGATIVE  NEGATIVE   Bilirubin Urine NEGATIVE  NEGATIVE   Ketones, ur NEGATIVE  NEGATIVE mg/dL   Protein, ur NEGATIVE  NEGATIVE mg/dL   Urobilinogen, UA 1.0  0.0 - 1.0 mg/dL   Nitrite NEGATIVE  NEGATIVE   Leukocytes, UA TRACE (*) NEGATIVE  URINE MICROSCOPIC-ADD ON     Status: None   Collection Time    05/01/13  4:23 PM      Result Value Range   Squamous Epithelial / LPF RARE  RARE  GLUCOSE, CAPILLARY     Status: None   Collection Time    05/01/13  6:48 PM      Result Value Range   Glucose-Capillary 83  70 - 99 mg/dL   Comment 1 Notify RN    SURGICAL PCR SCREEN     Status: Abnormal   Collection Time    05/01/13  9:22 PM      Result Value Range   MRSA, PCR NEGATIVE  NEGATIVE   Staphylococcus aureus POSITIVE (*) NEGATIVE  COMPREHENSIVE METABOLIC PANEL     Status: Abnormal   Collection Time    05/02/13  4:35 AM      Result Value Range   Sodium 135  135 - 145 mEq/L   Potassium 3.9  3.5 -  5.1 mEq/L   Chloride 100  96 - 112 mEq/L   CO2 26  19 - 32 mEq/L   Glucose, Bld 165 (*) 70 - 99 mg/dL   BUN 14  6 - 23 mg/dL   Creatinine, Ser 2.13  0.50 - 1.10 mg/dL   Calcium 9.4  8.4 - 08.6 mg/dL   Total Protein 6.0  6.0 - 8.3 g/dL   Albumin 2.8 (*) 3.5 - 5.2 g/dL   AST 67 (*) 0 - 37 U/L   ALT 52 (*) 0 - 35 U/L   Alkaline Phosphatase 243 (*) 39 - 117 U/L   Total Bilirubin 3.5 (*) 0.3 - 1.2 mg/dL   GFR calc non Af Amer 73 (*) >90 mL/min   GFR calc Af Amer 85 (*) >90 mL/min  CBC     Status: Abnormal   Collection Time    05/02/13  4:35 AM      Result Value Range   WBC 15.9 (*) 4.0 - 10.5 K/uL   RBC 3.35 (*) 3.87 - 5.11 MIL/uL   Hemoglobin 10.6 (*) 12.0 - 15.0 g/dL   HCT 57.8 (*) 46.9 - 62.9 %   MCV 96.1  78.0 - 100.0 fL   MCH 31.6  26.0 - 34.0 pg   MCHC 32.9  30.0 - 36.0 g/dL   RDW 52.8 (*) 41.3 - 24.4 %   Platelets 177  150 - 400 K/uL  LIPASE, BLOOD     Status: None   Collection Time    05/02/13  4:35 AM      Result Value Range   Lipase 15  11 - 59 U/L     Studies/Results Radiology     MEDS, Scheduled .  amLODipine  5 mg Oral Daily  . atorvastatin  40 mg Oral Daily  . ciprofloxacin  400 mg Intravenous BID  . cloNIDine  0.3 mg Oral BID  . estradiol  0.5 mg Oral BID  . heparin  5,000 Units Subcutaneous Q8H  . losartan  100 mg Oral Daily   And  . hydrochlorothiazide  12.5 mg Oral Daily  . metoprolol tartrate  12.5 mg Oral BID  . pantoprazole  40 mg Oral Daily       LOS: 1 day    Currie Paris, MD, [facility], [place] [phone]   05/02/2013 7:43 AM

## 2013-05-03 ENCOUNTER — Encounter (HOSPITAL_COMMUNITY): Payer: Self-pay | Admitting: Gastroenterology

## 2013-05-03 ENCOUNTER — Encounter (HOSPITAL_COMMUNITY): Admission: EM | Disposition: A | Discharge status: Home / Self Care | Payer: Self-pay | Source: Home / Self Care

## 2013-05-03 DIAGNOSIS — K859 Acute pancreatitis without necrosis or infection, unspecified: Secondary | ICD-10-CM

## 2013-05-03 LAB — CBC
MCH: 31 pg (ref 26.0–34.0)
MCV: 97.1 fL (ref 78.0–100.0)
Platelets: 154 10*3/uL (ref 150–400)
RBC: 3.06 MIL/uL — ABNORMAL LOW (ref 3.87–5.11)
RDW: 16.4 % — ABNORMAL HIGH (ref 11.5–15.5)
WBC: 7.6 10*3/uL (ref 4.0–10.5)

## 2013-05-03 LAB — COMPREHENSIVE METABOLIC PANEL
AST: 41 U/L — ABNORMAL HIGH (ref 0–37)
Albumin: 2.4 g/dL — ABNORMAL LOW (ref 3.5–5.2)
BUN: 12 mg/dL (ref 6–23)
Chloride: 101 mEq/L (ref 96–112)
Creatinine, Ser: 1 mg/dL (ref 0.50–1.10)
Potassium: 3.4 mEq/L — ABNORMAL LOW (ref 3.5–5.1)
Total Protein: 5.6 g/dL — ABNORMAL LOW (ref 6.0–8.3)

## 2013-05-03 LAB — LIPASE, BLOOD: Lipase: 1204 U/L — ABNORMAL HIGH (ref 11–59)

## 2013-05-03 SURGERY — LAPAROSCOPIC CHOLECYSTECTOMY WITH INTRAOPERATIVE CHOLANGIOGRAM
Anesthesia: General

## 2013-05-03 MED ORDER — METOPROLOL TARTRATE 1 MG/ML IV SOLN
5.0000 mg | Freq: Four times a day (QID) | INTRAVENOUS | Status: DC | PRN
  Administered 2013-05-05: 5 mg via INTRAVENOUS
  Filled 2013-05-03 (×2): qty 5

## 2013-05-03 MED ORDER — ACETAMINOPHEN 10 MG/ML IV SOLN
1000.0000 mg | Freq: Once | INTRAVENOUS | Status: AC
  Administered 2013-05-03: 1000 mg via INTRAVENOUS
  Filled 2013-05-03 (×2): qty 100

## 2013-05-03 NOTE — Progress Notes (Signed)
Subjective: Feeling well.  Mild epigastric discomfort.  Objective: Vital signs in last 24 hours: Temp:  [98.1 F (36.7 C)-98.7 F (37.1 C)] 98.7 F (37.1 C) (09/11 0500) Pulse Rate:  [52-69] 52 (09/11 1330) Resp:  [14-18] 18 (09/11 0500) BP: (114-164)/(58-69) 164/63 mmHg (09/11 1330) SpO2:  [90 %-97 %] 96 % (09/11 0500) Last BM Date: 05/01/13  Intake/Output from previous day: 09/10 0701 - 09/11 0700 In: 1580 [P.O.:480; I.V.:900; IV Piggyback:200] Out: 2150 [Urine:2150] Intake/Output this shift:    General appearance: alert and no distress GI: soft, non-tender; bowel sounds normal; no masses,  no organomegaly  Lab Results:  Recent Labs  05/01/13 1510 05/02/13 0435 05/03/13 0435  WBC 7.7 15.9* 7.6  HGB 11.2* 10.6* 9.5*  HCT 34.4* 32.2* 29.7*  PLT 218 177 154   BMET  Recent Labs  05/01/13 1510 05/02/13 0435 05/03/13 0435  NA 139 135 134*  K 3.8 3.9 3.4*  CL 100 100 101  CO2 26 26 25   GLUCOSE 143* 165* 172*  BUN 19 14 12   CREATININE 0.77 0.81 1.00  CALCIUM 10.4 9.4 8.6   LFT  Recent Labs  05/03/13 0435  PROT 5.6*  ALBUMIN 2.4*  AST 41*  ALT 35  ALKPHOS 251*  BILITOT 2.7*   PT/INR No results found for this basename: LABPROT, INR,  in the last 72 hours Hepatitis Panel No results found for this basename: HEPBSAG, HCVAB, HEPAIGM, HEPBIGM,  in the last 72 hours C-Diff No results found for this basename: CDIFFTOX,  in the last 72 hours Fecal Lactopherrin No results found for this basename: FECLLACTOFRN,  in the last 72 hours  Studies/Results: US Abdomen Complete  05/01/2013   CLINICAL DATA:  Abdominal pain.  EXAM: ABDOMEN ULTRASOUND  COMPARISON:  Seven hundred fourteen and  FINDINGS: Gallbladder  At least 1 gallstone is seen measuring 9 mm. No evidence of gallbladder wall thickening or sonographic Murphy's sign.  Common bile duct  Diameter: 12 mm, without significant change since previous study. Distal common bile duct not well visualized.  Liver  No  focal lesion identified. Within normal limits in parenchymal echogenicity.  IVC  No abnormality visualized.  Pancreas  Visualized portion unremarkable.  Spleen  Size and appearance within normal limits.  Right Kidney  Length: 11.3 cm. Echogenicity within normal limits. Simple cyst in midpole measuring 1.9 cm, and larger simple cyst in upper pole measuring 7.8 cm. No mass or hydronephrosis visualized.  Left Kidney  Length: 10.9 cm. Echogenicity within normal limits. No mass or hydronephrosis visualized.  Abdominal aorta  No aneurysm visualized.  IMPRESSION: Cholelithiasis, without sonographic signs of acute cholecystitis.  Stable biliary dilatation, with common bile duct measuring 12 mm. Etiology not visualized by ultrasound. Recommend correlation with liver function tests, and consider MRCP for further imaging evaluation clinically warranted.  Right renal cysts again noted. No evidence of renal mass or hydronephrosis.   Electronically Signed   By: Myles Rosenthal   On: 05/01/2013 16:00   Dg Ercp Biliary & Pancreatic Ducts  05/02/2013   *RADIOLOGY REPORT*  Clinical Data: ERCP.  Several stones removed by history.  ERCP  Comparison: Ultrasound on 05/01/2013  Findings: Four images are submitted, showing endoscope in the right upper quadrant.  Opacification of the common bile duct shows numerous filling defects, consistent with the history of stones seen at the time of procedure.  The last image is presumably performed following drainage, showing multiple filling defects, some or all of which are suspected to be air bubbles.  Correlation with real time findings is suggested.  IMPRESSION: Common duct stones.   Original Report Authenticated By: Norva Pavlov, M.Alexander.    Medications:  Scheduled: . amLODipine  5 mg Oral Daily  . atorvastatin  40 mg Oral Daily  . ciprofloxacin  400 mg Intravenous BID  . cloNIDine  0.3 mg Oral BID  . estradiol  0.5 mg Oral BID  . heparin  5,000 Units Subcutaneous Q8H  . metoprolol  tartrate  12.5 mg Oral BID  . pantoprazole  40 mg Oral Daily   Continuous: . dextrose 5 % and 0.45 % NaCl with KCl 20 mEq/L 125 mL/hr at 05/03/13 0756    Assessment/Plan: 1) Choledocholithiasis with cholangitis 2) Cholelithiasis. 3) ? Post ERCP pancreatitis.   Her lipase is elevated, but clinically I am not certain if she has pancreatitis.  Her pain is the same as her prior admission pain, but it is not as intense.  The lipase can be elevated post ERCP even without PD manipulation.  Clinically she looks well.  No fever over night and her TB has dropped.   Plan: 1) Clear liquids. 2) Lap chole per Dr. Jamey Ripa.  LOS: 2 days   [name] 05/03/2013, 1:50 PM

## 2013-05-03 NOTE — Progress Notes (Signed)
1 Day Post-Op  Subjective: She is still sore and very tender over the mid epigastric area.  No nausea or fever.  Complains of a headache, almost since she got here.  Objective: Vital signs in last 24 hours: Temp:  [98.1 F (36.7 C)-98.9 F (37.2 C)] 98.7 F (37.1 C) (09/11 0500) Pulse Rate:  [52-69] 52 (09/11 0500) Resp:  [12-22] 18 (09/11 0500) BP: (109-161)/(58-87) 150/69 mmHg (09/11 0500) SpO2:  [90 %-99 %] 96 % (09/11 0500) Last BM Date: 05/01/13 480 Po recorded. Afebrile, VSS Labs: K+ 3.4 Bilirubin down some and LFT's stable, Lipase up to 1204. WBC is back to normal H/H down slightly Intake/Output from previous day: 09/10 0701 - 09/11 0700 In: 1580 [P.O.:480; I.V.:900; IV Piggyback:200] Out: 2150 [Urine:2150] Intake/Output this shift:    General appearance: alert, cooperative and no distress GI: soft tender to light palpation below xyphoid, some generalized soreness of abdomen still persist. No distension, + BS. Left Arm: Swollen with some blisters from IV infiltrate.   Lab Results:   Recent Labs  05/02/13 0435 05/03/13 0435  WBC 15.9* 7.6  HGB 10.6* 9.5*  HCT 32.2* 29.7*  PLT 177 154    BMET  Recent Labs  05/02/13 0435 05/03/13 0435  NA 135 134*  K 3.9 3.4*  CL 100 101  CO2 26 25  GLUCOSE 165* 172*  BUN 14 12  CREATININE 0.81 1.00  CALCIUM 9.4 8.6   PT/INR No results found for this basename: LABPROT, INR,  in the last 72 hours   Recent Labs Lab 05/01/13 1510 05/02/13 0435 05/03/13 0435  AST 61* 67* 41*  ALT 45* 52* 35  ALKPHOS 259* 243* 251*  BILITOT 1.7* 3.5* 2.7*  PROT 7.3 6.0 5.6*  ALBUMIN 3.7 2.8* 2.4*     Lipase     Component Value Date/Time   LIPASE 1204* 05/03/2013 0435     Studies/Results: US Abdomen Complete  05/01/2013   CLINICAL DATA:  Abdominal pain.  EXAM: ABDOMEN ULTRASOUND  COMPARISON:  Seven hundred fourteen and  FINDINGS: Gallbladder  At least 1 gallstone is seen measuring 9 mm. No evidence of gallbladder wall  thickening or sonographic Murphy's sign.  Common bile duct  Diameter: 12 mm, without significant change since previous study. Distal common bile duct not well visualized.  Liver  No focal lesion identified. Within normal limits in parenchymal echogenicity.  IVC  No abnormality visualized.  Pancreas  Visualized portion unremarkable.  Spleen  Size and appearance within normal limits.  Right Kidney  Length: 11.3 cm. Echogenicity within normal limits. Simple cyst in midpole measuring 1.9 cm, and larger simple cyst in upper pole measuring 7.8 cm. No mass or hydronephrosis visualized.  Left Kidney  Length: 10.9 cm. Echogenicity within normal limits. No mass or hydronephrosis visualized.  Abdominal aorta  No aneurysm visualized.  IMPRESSION: Cholelithiasis, without sonographic signs of acute cholecystitis.  Stable biliary dilatation, with common bile duct measuring 12 mm. Etiology not visualized by ultrasound. Recommend correlation with liver function tests, and consider MRCP for further imaging evaluation clinically warranted.  Right renal cysts again noted. No evidence of renal mass or hydronephrosis.   Electronically Signed   By: Myles Rosenthal   On: 05/01/2013 16:00   Dg Ercp Biliary & Pancreatic Ducts  05/02/2013   *RADIOLOGY REPORT*  Clinical Data: ERCP.  Several stones removed by history.  ERCP  Comparison: Ultrasound on 05/01/2013  Findings: Four images are submitted, showing endoscope in the right upper quadrant.  Opacification of the  common bile duct shows numerous filling defects, consistent with the history of stones seen at the time of procedure.  The last image is presumably performed following drainage, showing multiple filling defects, some or all of which are suspected to be air bubbles.  Correlation with real time findings is suggested.  IMPRESSION: Common duct stones.   Original Report Authenticated By: Norva Pavlov, M.D.    Medications: . amLODipine  5 mg Oral Daily  . atorvastatin  40 mg Oral  Daily  . ciprofloxacin  400 mg Intravenous BID  . cloNIDine  0.3 mg Oral BID  . estradiol  0.5 mg Oral BID  . heparin  5,000 Units Subcutaneous Q8H  . losartan  100 mg Oral Daily   And  . hydrochlorothiazide  12.5 mg Oral Daily  . metoprolol tartrate  12.5 mg Oral BID  . pantoprazole  40 mg Oral Daily    Assessment/Plan 1. Cholelithiasis with obstruction      S/p ERCP 05/02/13 Dr. Jeani Hawking 2. Hypertension  3. Osteoarthritis, s/p bilateral knee replacements  4. Rheumatoid arthritis on Methotrexate  5. AODM type II  6. Dyslipidemia  7. Hx of GERD, esophageal stricture and esophagitis 2010. EGD Dr. Russella Dar  Colonoscopy Dr. Loreta Ave 2012  8. Hyperlipidemia  9. Body mass index is 33.2    Plan:  I think she has some post procedure pancreatitis, I will keep her NPO till Dr. Jamey Ripa can see her.  I will give her some IV tylenol for her headache, till we decide on surgery.  Recheck labs in Am.  Kpad later with elevation to the left upper arm.  Continue Cipro. I'm going to hold some of her BP meds while she is NPO, and BP is not up.  LOS: 2 days    Shella Lahman 05/03/2013

## 2013-05-03 NOTE — Progress Notes (Signed)
Agree with A&P of WJ,PA. She is better now and I think we will be abnle to do her surgery in the morning. [name] have clears now

## 2013-05-04 ENCOUNTER — Encounter (HOSPITAL_COMMUNITY): Payer: Self-pay | Admitting: Anesthesiology

## 2013-05-04 ENCOUNTER — Encounter (HOSPITAL_COMMUNITY): Admission: EM | Disposition: A | Discharge status: Home / Self Care | Payer: Self-pay | Source: Home / Self Care

## 2013-05-04 ENCOUNTER — Inpatient Hospital Stay (HOSPITAL_COMMUNITY): Payer: BC Managed Care – PPO

## 2013-05-04 ENCOUNTER — Encounter (HOSPITAL_COMMUNITY): Payer: Self-pay | Admitting: General Surgery

## 2013-05-04 ENCOUNTER — Inpatient Hospital Stay (HOSPITAL_COMMUNITY): Payer: BC Managed Care – PPO | Admitting: Anesthesiology

## 2013-05-04 DIAGNOSIS — R1013 Epigastric pain: Secondary | ICD-10-CM | POA: Diagnosis not present

## 2013-05-04 DIAGNOSIS — K8061 Calculus of gallbladder and bile duct with cholecystitis, unspecified, with obstruction: Secondary | ICD-10-CM | POA: Diagnosis not present

## 2013-05-04 DIAGNOSIS — K8010 Calculus of gallbladder with chronic cholecystitis without obstruction: Secondary | ICD-10-CM

## 2013-05-04 DIAGNOSIS — I10 Essential (primary) hypertension: Secondary | ICD-10-CM | POA: Diagnosis present

## 2013-05-04 DIAGNOSIS — E119 Type 2 diabetes mellitus without complications: Secondary | ICD-10-CM

## 2013-05-04 HISTORY — DX: Essential (primary) hypertension: I10

## 2013-05-04 HISTORY — DX: Calculus of gallbladder with chronic cholecystitis without obstruction: K80.10

## 2013-05-04 HISTORY — DX: Type 2 diabetes mellitus without complications: E11.9

## 2013-05-04 HISTORY — PX: CHOLECYSTECTOMY: SHX55

## 2013-05-04 LAB — CBC
HCT: 28.9 % — ABNORMAL LOW (ref 36.0–46.0)
Hemoglobin: 9.3 g/dL — ABNORMAL LOW (ref 12.0–15.0)
RBC: 2.96 MIL/uL — ABNORMAL LOW (ref 3.87–5.11)
WBC: 5 10*3/uL (ref 4.0–10.5)

## 2013-05-04 LAB — COMPREHENSIVE METABOLIC PANEL
Albumin: 2.4 g/dL — ABNORMAL LOW (ref 3.5–5.2)
Alkaline Phosphatase: 306 U/L — ABNORMAL HIGH (ref 39–117)
BUN: 8 mg/dL (ref 6–23)
Chloride: 108 mEq/L (ref 96–112)
GFR calc Af Amer: 79 mL/min — ABNORMAL LOW (ref >90)
Glucose, Bld: 152 mg/dL — ABNORMAL HIGH (ref 70–99)
Potassium: 4.1 mEq/L (ref 3.5–5.1)
Total Bilirubin: 2.2 mg/dL — ABNORMAL HIGH (ref 0.3–1.2)

## 2013-05-04 LAB — LIPASE, BLOOD: Lipase: 293 U/L — ABNORMAL HIGH (ref 11–59)

## 2013-05-04 SURGERY — LAPAROSCOPIC CHOLECYSTECTOMY WITH INTRAOPERATIVE CHOLANGIOGRAM
Anesthesia: General | Wound class: Clean Contaminated

## 2013-05-04 MED ORDER — LACTATED RINGERS IV SOLN
INTRAVENOUS | Status: DC | PRN
  Administered 2013-05-04 (×2): via INTRAVENOUS

## 2013-05-04 MED ORDER — LACTATED RINGERS IR SOLN
Status: DC | PRN
  Administered 2013-05-04: 1000 mL

## 2013-05-04 MED ORDER — LACTATED RINGERS IV SOLN
INTRAVENOUS | Status: DC
  Administered 2013-05-04: 1000 mL via INTRAVENOUS

## 2013-05-04 MED ORDER — 0.9 % SODIUM CHLORIDE (POUR BTL) OPTIME
TOPICAL | Status: DC | PRN
  Administered 2013-05-04: 1000 mL

## 2013-05-04 MED ORDER — LACTATED RINGERS IV SOLN: INTRAVENOUS | Status: DC

## 2013-05-04 MED ORDER — ROCURONIUM BROMIDE 100 MG/10ML IV SOLN
INTRAVENOUS | Status: DC | PRN
  Administered 2013-05-04: 30 mg via INTRAVENOUS

## 2013-05-04 MED ORDER — HYDROMORPHONE HCL PF 1 MG/ML IJ SOLN
0.2500 mg | INTRAMUSCULAR | Status: DC | PRN
  Administered 2013-05-04 (×2): 0.5 mg via INTRAVENOUS

## 2013-05-04 MED ORDER — MIDAZOLAM HCL 5 MG/5ML IJ SOLN
INTRAMUSCULAR | Status: DC | PRN
  Administered 2013-05-04: 2 mg via INTRAVENOUS

## 2013-05-04 MED ORDER — PROPOFOL 10 MG/ML IV BOLUS
INTRAVENOUS | Status: DC | PRN
  Administered 2013-05-04: 20 mg via INTRAVENOUS
  Administered 2013-05-04: 150 mg via INTRAVENOUS

## 2013-05-04 MED ORDER — ONDANSETRON HCL 4 MG/2ML IJ SOLN
INTRAMUSCULAR | Status: DC | PRN
  Administered 2013-05-04: 4 mg via INTRAVENOUS

## 2013-05-04 MED ORDER — DIPHENHYDRAMINE HCL 50 MG/ML IJ SOLN
25.0000 mg | Freq: Once | INTRAMUSCULAR | Status: AC
  Administered 2013-05-04: 25 mg via INTRAVENOUS
  Filled 2013-05-04: qty 1

## 2013-05-04 MED ORDER — BUPIVACAINE HCL (PF) 0.25 % IJ SOLN
INTRAMUSCULAR | Status: DC | PRN
  Administered 2013-05-04: 25 mL

## 2013-05-04 MED ORDER — SUCCINYLCHOLINE CHLORIDE 20 MG/ML IJ SOLN
INTRAMUSCULAR | Status: DC | PRN
  Administered 2013-05-04: 100 mg via INTRAVENOUS

## 2013-05-04 MED ORDER — BUPIVACAINE HCL 0.25 % IJ SOLN
INTRAMUSCULAR | Status: AC
  Filled 2013-05-04: qty 1

## 2013-05-04 MED ORDER — ONDANSETRON HCL 4 MG/2ML IJ SOLN: 4.0000 mg | Freq: Four times a day (QID) | INTRAMUSCULAR | Status: DC | PRN

## 2013-05-04 MED ORDER — FENTANYL CITRATE 0.05 MG/ML IJ SOLN
INTRAMUSCULAR | Status: DC | PRN
  Administered 2013-05-04: 50 ug via INTRAVENOUS
  Administered 2013-05-04 (×2): 25 ug via INTRAVENOUS
  Administered 2013-05-04: 50 ug via INTRAVENOUS
  Administered 2013-05-04: 100 ug via INTRAVENOUS

## 2013-05-04 MED ORDER — IOHEXOL 300 MG/ML  SOLN
INTRAMUSCULAR | Status: DC | PRN
  Administered 2013-05-04: 10 mL

## 2013-05-04 MED ORDER — GLYCOPYRROLATE 0.2 MG/ML IJ SOLN
INTRAMUSCULAR | Status: DC | PRN
  Administered 2013-05-04: 0.4 mg via INTRAVENOUS

## 2013-05-04 MED ORDER — LABETALOL HCL 5 MG/ML IV SOLN
INTRAVENOUS | Status: DC | PRN
  Administered 2013-05-04 (×2): 5 mg via INTRAVENOUS

## 2013-05-04 MED ORDER — LIDOCAINE HCL (CARDIAC) 20 MG/ML IV SOLN
INTRAVENOUS | Status: DC | PRN
  Administered 2013-05-04: 50 mg via INTRAVENOUS

## 2013-05-04 MED ORDER — KCL IN DEXTROSE-NACL 20-5-0.45 MEQ/L-%-% IV SOLN
INTRAVENOUS | Status: DC
  Administered 2013-05-04 – 2013-05-05 (×2): via INTRAVENOUS
  Filled 2013-05-04 (×3): qty 1000

## 2013-05-04 MED ORDER — MORPHINE SULFATE 2 MG/ML IJ SOLN
2.0000 mg | INTRAMUSCULAR | Status: DC | PRN
  Administered 2013-05-04: 2 mg via INTRAVENOUS
  Filled 2013-05-04 (×2): qty 1

## 2013-05-04 MED ORDER — HYDROMORPHONE HCL PF 1 MG/ML IJ SOLN
INTRAMUSCULAR | Status: AC
  Filled 2013-05-04: qty 1

## 2013-05-04 MED ORDER — HYDROMORPHONE HCL PF 1 MG/ML IJ SOLN
INTRAMUSCULAR | Status: DC | PRN
  Administered 2013-05-04 (×2): 0.5 mg via INTRAVENOUS

## 2013-05-04 MED ORDER — NEOSTIGMINE METHYLSULFATE 1 MG/ML IJ SOLN
INTRAMUSCULAR | Status: DC | PRN
  Administered 2013-05-04: 5 mg via INTRAVENOUS

## 2013-05-04 SURGICAL SUPPLY — 42 items
ADH SKN CLS APL DERMABOND .7 (GAUZE/BANDAGES/DRESSINGS) ×1
APPLIER CLIP ROT 10 11.4 M/L (STAPLE) ×2
APR CLP MED LRG 11.4X10 (STAPLE) ×1
BAG SPEC RTRVL LRG 6X4 10 (ENDOMECHANICALS) ×1
CANISTER SUCTION 2500CC (MISCELLANEOUS) ×2 IMPLANT
CHLORAPREP W/TINT 10.5 ML (MISCELLANEOUS) ×2 IMPLANT
CLIP APPLIE ROT 10 11.4 M/L (STAPLE) ×1 IMPLANT
CLOTH BEACON ORANGE TIMEOUT ST (SAFETY) ×2 IMPLANT
COVER MAYO STAND STRL (DRAPES) ×1 IMPLANT
DECANTER SPIKE VIAL GLASS SM (MISCELLANEOUS) ×1 IMPLANT
DERMABOND ADVANCED (GAUZE/BANDAGES/DRESSINGS) ×1
DERMABOND ADVANCED .7 DNX12 (GAUZE/BANDAGES/DRESSINGS) IMPLANT
DRAPE C-ARM 42X120 X-RAY (DRAPES) ×1 IMPLANT
DRAPE LAPAROSCOPIC ABDOMINAL (DRAPES) ×2 IMPLANT
ELECT REM PT RETURN 9FT ADLT (ELECTROSURGICAL) ×2
ELECTRODE REM PT RTRN 9FT ADLT (ELECTROSURGICAL) ×1 IMPLANT
FILTER SMOKE EVAC LAPAROSHD (FILTER) ×2 IMPLANT
GLOVE BIOGEL PI IND STRL 7.0 (GLOVE) ×1 IMPLANT
GLOVE BIOGEL PI INDICATOR 7.0 (GLOVE) ×1
GLOVE EUDERMIC 7 POWDERFREE (GLOVE) ×2 IMPLANT
GOWN PREVENTION PLUS LG XLONG (DISPOSABLE) ×1 IMPLANT
GOWN STRL NON-REIN LRG LVL3 (GOWN DISPOSABLE) ×1 IMPLANT
GOWN STRL REIN 3XL XLG LVL4 (GOWN DISPOSABLE) ×1 IMPLANT
GOWN STRL REIN XL XLG (GOWN DISPOSABLE) ×4 IMPLANT
HEMOSTAT SURGICEL 4X8 (HEMOSTASIS) IMPLANT
IV SET EXT 30 76VOL 4 MALE LL (IV SETS) IMPLANT
KIT BASIN OR (CUSTOM PROCEDURE TRAY) ×2 IMPLANT
NS IRRIG 1000ML POUR BTL (IV SOLUTION) ×1 IMPLANT
POUCH SPECIMEN RETRIEVAL 10MM (ENDOMECHANICALS) ×1 IMPLANT
SET CHOLANGIOGRAPH MIX (MISCELLANEOUS) ×2 IMPLANT
SET IRRIG TUBING LAPAROSCOPIC (IRRIGATION / IRRIGATOR) ×2 IMPLANT
SLEEVE Z-THREAD 5X100MM (TROCAR) ×1 IMPLANT
SOLUTION ANTI FOG 6CC (MISCELLANEOUS) ×2 IMPLANT
STOPCOCK K 69 2C6206 (IV SETS) IMPLANT
STRIP CLOSURE SKIN 1/2X4 (GAUZE/BANDAGES/DRESSINGS) ×4 IMPLANT
SUT MNCRL AB 4-0 PS2 18 (SUTURE) ×2 IMPLANT
TOWEL OR 17X26 10 PK STRL BLUE (TOWEL DISPOSABLE) ×2 IMPLANT
TRAY LAP CHOLE (CUSTOM PROCEDURE TRAY) ×2 IMPLANT
TROCAR BLADELESS OPT 5 100 (ENDOMECHANICALS) ×2 IMPLANT
TROCAR XCEL BLUNT TIP 100MML (ENDOMECHANICALS) ×2 IMPLANT
TROCAR XCEL NON-BLD 11X100MML (ENDOMECHANICALS) ×2 IMPLANT
TUBING INSUFFLATION 10FT LAP (TUBING) ×2 IMPLANT

## 2013-05-04 NOTE — Transfer of Care (Signed)
Immediate Anesthesia Transfer of Care Note  Patient: [name]  Procedure(s) Performed: Procedure(s) (LRB): LAPAROSCOPIC CHOLECYSTECTOMY WITH INTRAOPERATIVE CHOLANGIOGRAM (N/A)  Patient Location: PACU  Anesthesia Type: General  Level of Consciousness: sedated, patient cooperative and responds to stimulaton  Airway & Oxygen Therapy: Patient Spontanous Breathing and Patient connected to face mask oxgen  Post-op Assessment: Report given to PACU RN and Post -op Vital signs reviewed and stable  Post vital signs: Reviewed and stable  Complications: No apparent anesthesia complications

## 2013-05-04 NOTE — Anesthesia Preprocedure Evaluation (Addendum)
Anesthesia Evaluation  Patient identified by MRN, date of birth, ID band Patient awake    Reviewed: Allergy & Precautions, H&P , NPO status , Patient's Chart, lab work & pertinent test results, reviewed documented beta blocker date and time   Airway Mallampati: II TM Distance: <3 FB Neck ROM: full    Dental no notable dental hx. (+) Teeth Intact and Dental Advisory Given   Pulmonary neg pulmonary ROS,  breath sounds clear to auscultation  Pulmonary exam normal       Cardiovascular Exercise Tolerance: Good hypertension, Pt. on medications and Pt. on home beta blockers Rhythm:regular Rate:Normal     Neuro/Psych negative neurological ROS  negative psych ROS   GI/Hepatic negative GI ROS, Neg liver ROS, GERD-  Medicated and Controlled,  Endo/Other  diabetes, Well Controlled, Type 2, Oral Hypoglycemic Agents  Renal/GU negative Renal ROS  negative genitourinary   Musculoskeletal  (+) Arthritis -, Rheumatoid disorders,    Abdominal   Peds  Hematology negative hematology ROS (+) anemia , hgb 9.3   Anesthesia Other Findings   Reproductive/Obstetrics negative OB ROS                          Anesthesia Physical Anesthesia Plan  ASA: III  Anesthesia Plan: General   Post-op Pain Management:    Induction: Intravenous  Airway Management Planned: Oral ETT  Additional Equipment:   Intra-op Plan:   Post-operative Plan: Extubation in OR  Informed Consent: I have reviewed the patients History and Physical, chart, labs and discussed the procedure including the risks, benefits and alternatives for the proposed anesthesia with the patient or authorized representative who has indicated his/her understanding and acceptance.   Dental Advisory Given  Plan Discussed with: CRNA and Surgeon  Anesthesia Plan Comments:         Anesthesia Quick Evaluation

## 2013-05-04 NOTE — Progress Notes (Signed)
Agree with A&P of WJ,PA. Patient is ready to proceed and has no more questions

## 2013-05-04 NOTE — Progress Notes (Signed)
2 Days Post-Op  Subjective: She is still sore, more in the RUQ, no nausea, no significant pain.  Tolerated clears yesterday.  Objective: Vital signs in last 24 hours: Temp:  [97.8 F (36.6 C)-98.9 F (37.2 C)] 97.8 F (36.6 C) (09/12 0527) Pulse Rate:  [50-60] 50 (09/12 0527) Resp:  [16-18] 18 (09/12 0527) BP: (151-170)/(63-80) 161/75 mmHg (09/12 0650) SpO2:  [95 %-98 %] 95 % (09/12 0527) Last BM Date: 05/01/13 480 PO NPO now Afebrile, VSS Lipase is better, Alk phos, Ast and Bil still up some Intake/Output from previous day: 09/11 0701 - 09/12 0700 In: 480 [P.O.:480] Out: 1450 [Urine:1450] Intake/Output this shift:    General appearance: alert, cooperative and no distress Resp: clear to auscultation bilaterally GI: soft she is still tender in the RUQ, less midepigastric.  no distension +BS  Lab Results:   Recent Labs  05/03/13 0435 05/04/13 0452  WBC 7.6 5.0  HGB 9.5* 9.3*  HCT 29.7* 28.9*  PLT 154 186    BMET  Recent Labs  05/03/13 0435 05/04/13 0452  NA 134* 138  K 3.4* 4.1  CL 101 108  CO2 25 24  GLUCOSE 172* 152*  BUN 12 8  CREATININE 1.00 0.86  CALCIUM 8.6 9.1   PT/INR No results found for this basename: LABPROT, INR,  in the last 72 hours   Recent Labs Lab 05/01/13 1510 05/02/13 0435 05/03/13 0435 05/04/13 0452  AST 61* 67* 41* 42*  ALT 45* 52* 35 31  ALKPHOS 259* 243* 251* 306*  BILITOT 1.7* 3.5* 2.7* 2.2*  PROT 7.3 6.0 5.6* 5.9*  ALBUMIN 3.7 2.8* 2.4* 2.4*     Lipase     Component Value Date/Time   LIPASE 293* 05/04/2013 0452     Studies/Results: Dg Ercp Biliary & Pancreatic Ducts  05/02/2013   *RADIOLOGY REPORT*  Clinical Data: ERCP.  Several stones removed by history.  ERCP  Comparison: Ultrasound on 05/01/2013  Findings: Four images are submitted, showing endoscope in the right upper quadrant.  Opacification of the common bile duct shows numerous filling defects, consistent with the history of stones seen at the time of  procedure.  The last image is presumably performed following drainage, showing multiple filling defects, some or all of which are suspected to be air bubbles.  Correlation with real time findings is suggested.  IMPRESSION: Common duct stones.   Original Report Authenticated By: Norva Pavlov, M.D.    Medications: . amLODipine  5 mg Oral Daily  . atorvastatin  40 mg Oral Daily  . ciprofloxacin  400 mg Intravenous BID  . cloNIDine  0.3 mg Oral BID  . estradiol  0.5 mg Oral BID  . heparin  5,000 Units Subcutaneous Q8H  . metoprolol tartrate  12.5 mg Oral BID  . pantoprazole  40 mg Oral Daily    Assessment/Plan 1. Cholelithiasis with obstruction  S/p ERCP 05/02/13 Dr. Jeani Hawking  2. Hypertension  3. Osteoarthritis, s/p bilateral knee replacements  4. Rheumatoid arthritis on Methotrexate  5. AODM type II  6. Dyslipidemia  7. Hx of GERD, esophageal stricture and esophagitis 2010. EGD Dr. Russella Dar  Colonoscopy Dr. Loreta Ave 2012  8. Hyperlipidemia  9. Body mass index is 33.2    Plan:  Plan cholecystectomy for today.  Pt aware and ready.     LOS: 3 days    [name] 05/04/2013

## 2013-05-04 NOTE — Discharge Summary (Signed)
Physician Discharge Summary  Patient ID: [name] MRN: [id number] DOB/AGE: 1946-05-02 67 y.o.  Admit date: 05/01/2013 Discharge date: 05/05/2013  Admission Diagnoses: Chronic calculous cholecystits, s/p ERCP for CBD stones   Discharge Diagnoses: same 1. Cholelithiasis with obstruction  S/p ERCP 05/02/13 Dr. Jeani Hawking  2. Hypertension  3. Osteoarthritis, s/p bilateral knee replacements  4. Rheumatoid arthritis on Methotrexate  5. AODM type II  6. Dyslipidemia  7. Hx of GERD, esophageal stricture and esophagitis 2010. EGD Dr. Russella Dar  Colonoscopy Dr. Loreta Ave 2012  8. Hyperlipidemia  9. Body mass index is 33.2   Active Problems:   Chronic cholecystitis with calculus   Unspecified essential hypertension   Diabetes type 2, controlled   PROCEDURES:  1.S/p ERCP 05/02/13 Dr. Jeani Hawking 2. laparoscopic cholecystectomy with intraoperative cholangiogram Currie Paris, MD, [facility] 05/04/13     Hospital Course: 67 year old female with abdominal pain. Epigastric region with radiation to her back. Patient has had similar intermittent pain over the past 2 years. Has become more frequent and more severe within the past week. Today patient's pain started shortly after eating breakfast. Feels like a pressure in her upper abdomen with occasional sharper pain. Some Nausea on and off, one episode of vomiting. No fevers or chills. No chest pain or shortness of breath. No appreciable exacerbating or relieving factors, comes on with or without foods, no particular food sets it off. Tired of missing work, 2-3 episodes since last week, several episodes per month on and off for 2 years.No pain relief till she was treated in the ER here today.  Work up in the ER shows normal WBC, mild LFT elevation and 9mm gallstone, no cholecystitis, 12 mm CBD, this was previously elevated back in 02/2009. W/u at that time showed esophagitis, and distal esophageal stricture. Symptoms of GERD well controlled now with  PPI.   She was admitted into the Hospital and placed on IV fluids, antibiotics, and bowel rest. Labs to follow in the AM showed an elevation in her bilirubin and her WBC. GI consult was obtained. She was seen by Dr. Elnoria Howard who performed an ERCP that morning. Labs improved but she had some mild pancreatitis. She was placed on bowel rest and just clears. The following AM her labs improved and she felt less tender so she was taken to surgery and a procedure described above was performed. She was afebrile post op and discharged the following day. I do not see a note from weekend aside from nursing note of discharge.       Disposition: 01-Home or Self Care      Discharge Orders   Future Appointments Provider Department Dept Phone   07/02/2013 8:00 AM Dorena Bodo, PA-C [facility] [phone]   Future Orders Complete By Expires   Discharge patient  As directed        Medication List         amLODipine 5 MG tablet  Commonly known as:  NORVASC  Take 1 tablet (5 mg total) by mouth daily.     aspirin EC 81 MG tablet  Take 81 mg by mouth daily.     atorvastatin 40 MG tablet  Commonly known as:  LIPITOR  Take 40 mg by mouth daily.     CALCIUM 600 + D PO  Take 1 tablet by mouth 2 (two) times daily.     cloNIDine 0.3 MG tablet  Commonly known as:  CATAPRES  Take 1 tablet (0.3 mg total) by mouth  2 (two) times daily.     estradiol 0.5 MG tablet  Commonly known as:  ESTRACE  Take 0.5 mg by mouth 2 (two) times daily.     fish oil-omega-3 fatty acids 1000 MG capsule  Take 1 g by mouth at bedtime.     folic acid 1 MG tablet  Commonly known as:  FOLVITE  Take 1 tablet by mouth at bedtime.     losartan-hydrochlorothiazide 100-12.5 MG per tablet  Commonly known as:  HYZAAR  Take 1 tablet by mouth daily.     metFORMIN 1000 MG tablet  Commonly known as:  GLUCOPHAGE  TAKE ONE TABLET BY MOUTH TWICE DAILY     methotrexate 2.5 MG tablet  Commonly known as:   RHEUMATREX  Take 8 tablets by mouth once a week.     metoprolol tartrate 25 MG tablet  Commonly known as:  LOPRESSOR  Take 12.5 mg by mouth 2 (two) times daily.     multivitamins ther. w/minerals Tabs tablet  Take 1 tablet by mouth daily.     omeprazole 20 MG capsule  Commonly known as:  PRILOSEC  Take 1 capsule (20 mg total) by mouth daily.     oxyCODONE 5 MG immediate release tablet  Commonly known as:  Oxy IR/ROXICODONE  Take 1-2 tablets (5-10 mg total) by mouth every 4 (four) hours as needed.       Follow-up Information   Follow up with Currie Paris, MD. Schedule an appointment as soon as possible for a visit in 3 weeks.   Specialty:  General Surgery   Contact information:   [address] [place] [address] [phone]       Signed: Sherrie George 05/07/2013, 3:38 PM

## 2013-05-04 NOTE — Anesthesia Postprocedure Evaluation (Signed)
  Anesthesia Post-op Note  Patient: [name]  Procedure(s) Performed: Procedure(s) (LRB): LAPAROSCOPIC CHOLECYSTECTOMY WITH INTRAOPERATIVE CHOLANGIOGRAM (N/A)  Patient Location: PACU  Anesthesia Type: General  Level of Consciousness: awake and alert   Airway and Oxygen Therapy: Patient Spontanous Breathing  Post-op Pain: mild  Post-op Assessment: Post-op Vital signs reviewed, Patient's Cardiovascular Status Stable, Respiratory Function Stable, Patent Airway and No signs of Nausea or vomiting  Last Vitals:  Filed Vitals:   05/04/13 1440  BP: 186/84  Pulse: 75  Temp: 36.8 C  Resp: 13    Post-op Vital Signs: stable   Complications: No apparent anesthesia complications

## 2013-05-04 NOTE — Op Note (Signed)
[name] 11-Feb-1946 [id number] 05/01/2013  Preoperative diagnosis: Chronic calculous cholecystits, s/p ERCP for CBD stones  Postoperative diagnosis: Smae  Procedure: laparoscopic cholecystectomy with intraoperative cholangiogram  Surgeon: Currie Paris, MD, FACS  Assistant surgeon: Suann Larry   Anesthesia: General  Clinical History and Indications: This patient has known gallstones and comes in today for cholecystectomy.she underwent ERCP and removal of common duct stones 2 days ago. She very mild transient post ERCP pancreatitis but this appears clinically resolved today.  Description of procedure: The patient was seen in the preoperative area. I reviewed the plans for the procedure with her as well as the risks and complications. She had no further questions and wished to proceed.  The patient was taken to the operating room. After satisfactory general endotracheal anesthesia had been obtained the abdomen was prepped and draped. A time out was done.  0.25% plain Marcaine was used at all incisions. I made an umbilical incision, identified the fascia and opened that, and entered the peritoneal cavity under direct vision. A 0 Vicryl pursestring suture was placed and the Hasson cannula was introduced under direct vision and secured with the pursestring. The abdomen was inflated to 15 cm.  The camera was placed and there were no gross abnormalities. The patient was then placed in reverse Trendelenburg and tilted to the left. A 10/11 trocar was placed in the epigastrium and two 5 mm trochars placed laterally all under direct vision.  I opened the peritoneum in the area of the cystic duct was able to dissect down identify long segment of cystic duct and cystic artery with a nice window behind, obtaining a critical view. I put a clip on the cystic artery and one on the cystic duct at its junction with the gallbladder.  An intraoperative cholangiogram was then performed. A Cook catheter  was introduced percutaneously and placed in the cystic duct. The cholangiogram showed good filling of the common duct and hepatic radicals and free flow into the duodenum. No abnormalities were noted.the common duct was dilated consistent with recent passage of common duct stones.  The catheter was removed and 3 clips placed on the stay side of the cystic duct. The duct was then divided.  Additional clips are placed on the cystic artery and it was divided. The gallbladder was then removed from below to above the coagulation current of the cautery. It was then placed in a bag to be retrieved later.  The abdomen was irrigated and a check for hemostasis along the bed of the gallbladder made. Once everything appeared to be dry we were able to move the camera to the epigastric port and removed the gallbladder through the umbilical port.  The abdomen was reinsufflated and a final check for hemostasis made. There is no evidence of bleeding or bile leakage. The lateral ports were removed under direct vision and there was no bleeding. The umbilical site was closed with a pursestring, watching with the camera in the epigastric port. The abdomen was then deflated through the epigastric port and that was removed. Skin was closed with 4-0 Monocryl subcuticular and Dermabond.  The patient tolerated the procedure well. There were no operative complications. EBL was minimal. All counts were correct.  Currie Paris, MD, FACS 05/04/2013 12:30 PM

## 2013-05-04 NOTE — Progress Notes (Signed)
Subjective: Diffuse itching.  Objective: Vital signs in last 24 hours: Temp:  [97.3 F (36.3 C)-98.9 F (37.2 C)] 98.3 F (36.8 C) (09/12 1440) Pulse Rate:  [50-90] 75 (09/12 1440) Resp:  [13-18] 13 (09/12 1440) BP: (151-186)/(70-94) 186/84 mmHg (09/12 1440) SpO2:  [95 %-100 %] 100 % (09/12 1440) Last BM Date: 05/01/13  Intake/Output from previous day: 09/11 0701 - 09/12 0700 In: 480 [P.O.:480] Out: 1450 [Urine:1450] Intake/Output this shift: Total I/O In: 1200 [I.V.:1200] Out: 825 [Urine:825]  General appearance: alert and no distress GI: tender at the incision sites.  Lab Results:  Recent Labs  05/02/13 0435 05/03/13 0435 05/04/13 0452  WBC 15.9* 7.6 5.0  HGB 10.6* 9.5* 9.3*  HCT 32.2* 29.7* 28.9*  PLT 177 154 186   BMET  Recent Labs  05/02/13 0435 05/03/13 0435 05/04/13 0452  NA 135 134* 138  K 3.9 3.4* 4.1  CL 100 101 108  CO2 26 25 24   GLUCOSE 165* 172* 152*  BUN 14 12 8   CREATININE 0.81 1.00 0.86  CALCIUM 9.4 8.6 9.1   LFT  Recent Labs  05/04/13 0452  PROT 5.9*  ALBUMIN 2.4*  AST 42*  ALT 31  ALKPHOS 306*  BILITOT 2.2*   PT/INR No results found for this basename: LABPROT, INR,  in the last 72 hours Hepatitis Panel No results found for this basename: HEPBSAG, HCVAB, HEPAIGM, HEPBIGM,  in the last 72 hours C-Diff No results found for this basename: CDIFFTOX,  in the last 72 hours Fecal Lactopherrin No results found for this basename: FECLLACTOFRN,  in the last 72 hours  Studies/Results: Dg Cholangiogram Operative  05/04/2013   *RADIOLOGY REPORT*  Clinical Data: History of right upper quadrant pain and common bile duct stone removal.  INTRAOPERATIVE CHOLANGIOGRAM  Technique:  C-arm fluoroscopic images were obtained intraoperatively and submitted for postoperative interpretation. Please see the performing provider's procedural report for the fluoroscopy time utilized.  Comparison: Ultrasound 05/01/2013 and ERCP images 05/02/2013   Findings: Contrast fills the common bile duct and common hepatic duct.  Contrast drains into the duodenum.  There are no large filling defects or obstructing lesions. There are linear areas of lucency in the biliary system which are nonspecific and cannot exclude nonobstructive sludge.  IMPRESSION: The biliary system is patent without obstructing lesions or stones.   Original Report Authenticated By: Richarda Overlie, M.D.    Medications:  Scheduled: . amLODipine  5 mg Oral Daily  . atorvastatin  40 mg Oral Daily  . cloNIDine  0.3 mg Oral BID  . estradiol  0.5 mg Oral BID  . heparin  5,000 Units Subcutaneous Q8H  . HYDROmorphone      . metoprolol tartrate  12.5 mg Oral BID  . pantoprazole  40 mg Oral Daily   Continuous: . dextrose 5 % and 0.45 % NaCl with KCl 20 mEq/L 75 mL/hr at 05/04/13 1553    Assessment/Plan: 1) S/p lap chole. 2) S/p ERCP for choledocholithiasis and cholangitis. 3) Pruritis. - ? Etiology.   She is well after her lap chole.  Uneventful surgery.  She currently complains of pruritis and there is no evidence of any hives.  Her TB has dropped down from 3.5 into the 2 range.  She was not complaining of pruritis when her TB was at 3.5.  This may be medication reaction.    Plan: 1) Routine post-operative care. 2) Benadryl 25 mg IV. 3) Signing off.  LOS: 3 days   Alliah Boulanger D 05/04/2013, 3:56 PM

## 2013-05-05 LAB — COMPREHENSIVE METABOLIC PANEL
ALT: 44 U/L — ABNORMAL HIGH (ref 0–35)
AST: 62 U/L — ABNORMAL HIGH (ref 0–37)
Albumin: 2.5 g/dL — ABNORMAL LOW (ref 3.5–5.2)
Alkaline Phosphatase: 352 U/L — ABNORMAL HIGH (ref 39–117)
Glucose, Bld: 127 mg/dL — ABNORMAL HIGH (ref 70–99)
Potassium: 3.6 mEq/L (ref 3.5–5.1)
Sodium: 138 mEq/L (ref 135–145)
Total Protein: 6 g/dL (ref 6.0–8.3)

## 2013-05-05 LAB — CBC
Hemoglobin: 9.4 g/dL — ABNORMAL LOW (ref 12.0–15.0)
MCHC: 31.4 g/dL (ref 30.0–36.0)
Platelets: 170 10*3/uL (ref 150–400)
RDW: 16.3 % — ABNORMAL HIGH (ref 11.5–15.5)

## 2013-05-05 MED ORDER — OXYCODONE HCL 5 MG PO TABS: 5.0000 mg | ORAL_TABLET | ORAL | Status: DC | PRN

## 2013-05-05 NOTE — Progress Notes (Signed)
Pt scharge to home  with family with no sign of distress,no complaints , no concerns voiced out.Discharge instruction given and voiced out understanding.Prescription given.

## 2013-05-07 ENCOUNTER — Encounter (HOSPITAL_COMMUNITY): Payer: Self-pay | Admitting: Surgery

## 2013-05-09 ENCOUNTER — Ambulatory Visit (INDEPENDENT_AMBULATORY_CARE_PROVIDER_SITE_OTHER): Payer: BC Managed Care – PPO | Admitting: Family Medicine

## 2013-05-09 ENCOUNTER — Encounter: Payer: Self-pay | Admitting: Family Medicine

## 2013-05-09 VITALS — BP 120/78 | HR 100 | Temp 99.6°F | Resp 20 | Ht 60.0 in | Wt 161.0 lb

## 2013-05-09 DIAGNOSIS — IMO0002 Reserved for concepts with insufficient information to code with codable children: Secondary | ICD-10-CM

## 2013-05-09 DIAGNOSIS — L089 Local infection of the skin and subcutaneous tissue, unspecified: Secondary | ICD-10-CM | POA: Insufficient documentation

## 2013-05-09 HISTORY — DX: Local infection of the skin and subcutaneous tissue, unspecified: L08.9

## 2013-05-09 NOTE — Assessment & Plan Note (Addendum)
The patient has a mild superficial infection where the skin was removed after the IV infiltrated. I think this initially started secondary to irritation from the bandage. Skin was washed at the bedside with sterile water and dribbling of eye ointment was applied. She can apply this daily for the next few days and keep covered I see no signs of severe infection or hematoma.

## 2013-05-09 NOTE — Progress Notes (Signed)
  Subjective:    Patient ID: [name], female    DOB: 04/19/1946, 67 y.o.   MRN: [id number]  HPI  Patient here with concern about her left arm. She was admitted in the hospital last week for cholecystectomy 9/9. IV infiltrated and she had significant swelling there was a bandage that was applied to keep the IV in tact without was removed she had a red  semicircle where the bandage was attached to her skin the skin is now brought in she had some mild drainage beneath the Tegaderm that was applied at discharge this weekend. She denies any pain in her arm she has some mild tenderness over the area of the rash. She denies any tingling or numbness in the hand   Review of Systems - per above  GEN- denies fatigue, fever, weight loss,weakness, recent illness MSK- denies joint pain, muscle aches, injury        Objective:   Physical Exam  GEN-NAD,alert and oriented x 3 Skin- Left upper arm - semi circular abrasion with erythema, mild yellow crusting at edge, no foul odor, no active draiange, no ecchymosis seen, no hematoma  Pulse -2+ radial      Assessment & Plan:

## 2013-05-09 NOTE — Patient Instructions (Signed)
Apply the triple antibiotic ointment to area Keep covered and clean Call if you notice oozing or pus from the area F/U as previous

## 2013-05-14 ENCOUNTER — Other Ambulatory Visit: Payer: Self-pay | Admitting: Physician Assistant

## 2013-05-21 ENCOUNTER — Encounter (INDEPENDENT_AMBULATORY_CARE_PROVIDER_SITE_OTHER): Payer: Self-pay

## 2013-05-22 ENCOUNTER — Other Ambulatory Visit: Payer: Self-pay | Admitting: Physician Assistant

## 2013-05-23 NOTE — Telephone Encounter (Signed)
Medication refilled per protocol. 

## 2013-05-30 ENCOUNTER — Ambulatory Visit (INDEPENDENT_AMBULATORY_CARE_PROVIDER_SITE_OTHER): Payer: BC Managed Care – PPO | Admitting: Surgery

## 2013-05-30 ENCOUNTER — Encounter (INDEPENDENT_AMBULATORY_CARE_PROVIDER_SITE_OTHER): Payer: Self-pay

## 2013-05-30 ENCOUNTER — Encounter (INDEPENDENT_AMBULATORY_CARE_PROVIDER_SITE_OTHER): Payer: Self-pay | Admitting: Surgery

## 2013-05-30 VITALS — BP 132/74 | HR 68 | Resp 16 | Ht 60.0 in | Wt 169.4 lb

## 2013-05-30 DIAGNOSIS — Z09 Encounter for follow-up examination after completed treatment for conditions other than malignant neoplasm: Secondary | ICD-10-CM

## 2013-05-30 NOTE — Progress Notes (Signed)
NAME: [name]       DOB: [birth year]           DATE: 05/30/2013       WUJ:[id number]   CC:  Chief Complaint  Patient presents with  . Routine Post Op    1st po GB     Impression:  The patient appears to be doing well, with improvement in her symptoms.  Plan:  She may resume full activity and regular diet. She  will followup with [place] on a p.r.n. basis. I did tell her that she may still have some foods that cause indigestion and ask her to call us if there are any questions, problems or concerns.  HPI:  This patient underwent a laparoscopic cholecystectomy Lap chole with operative cholangiogram on 05/04/13. She had pre=procedure ERCP. She is in for her first postoperative visit. She notes that her incisional pain has resolved. Her preoperative symptoms have improved. She is not having problems with nausea, vomiting, diarrhea, fevers, chills, or urinary symptoms. She is tolerating diet. She feels that she is progressing well and nearly back to normal. PE:  VS: BP 132/74  Pulse 68  Resp 16  Ht 5' (1.524 m)  Wt 169 lb 6.4 oz (76.839 kg)  BMI 33.08 kg/m2  General: The patient is alert and appears comfortable, NAD.  Abdomen: Soft and benign. The incisions are healing nicely. There are no apparent problems.  Data reviewed: IOC:  Comparison: Ultrasound 05/01/2013 and ERCP images 05/02/2013  Findings: Contrast fills the common bile duct and common hepatic  duct. Contrast drains into the duodenum. There are no large  filling defects or obstructing lesions. There are linear areas of  lucency in the biliary system which are nonspecific and cannot  exclude nonobstructive sludge.  IMPRESSION:  The biliary system is patent without obstructing lesions or stones.  Pathology:  Diagnosis Gallbladder - CHRONIC CHOLECYSTITIS AND CHOLELITHIASIS. Jimmy Picket MD Pathologist, Electronic Signature (Case signed 05/07/2013)

## 2013-05-30 NOTE — Patient Instructions (Signed)
We will see you again on an as needed basis. Please call the office at [phone] if you have any questions or concerns. Thank you for allowing us to take care of you.  

## 2013-07-02 ENCOUNTER — Ambulatory Visit: Payer: BC Managed Care – PPO | Admitting: Physician Assistant

## 2013-07-04 ENCOUNTER — Ambulatory Visit (INDEPENDENT_AMBULATORY_CARE_PROVIDER_SITE_OTHER): Payer: BC Managed Care – PPO | Admitting: Physician Assistant

## 2013-07-04 ENCOUNTER — Encounter: Payer: Self-pay | Admitting: Physician Assistant

## 2013-07-04 VITALS — BP 130/86 | HR 60 | Temp 97.0°F | Resp 14 | Ht 60.0 in | Wt 166.0 lb

## 2013-07-04 DIAGNOSIS — E119 Type 2 diabetes mellitus without complications: Secondary | ICD-10-CM

## 2013-07-04 DIAGNOSIS — K7689 Other specified diseases of liver: Secondary | ICD-10-CM

## 2013-07-04 DIAGNOSIS — M069 Rheumatoid arthritis, unspecified: Secondary | ICD-10-CM

## 2013-07-04 DIAGNOSIS — I10 Essential (primary) hypertension: Secondary | ICD-10-CM

## 2013-07-04 DIAGNOSIS — E785 Hyperlipidemia, unspecified: Secondary | ICD-10-CM

## 2013-07-04 DIAGNOSIS — Z79890 Hormone replacement therapy (postmenopausal): Secondary | ICD-10-CM

## 2013-07-04 DIAGNOSIS — M1711 Unilateral primary osteoarthritis, right knee: Secondary | ICD-10-CM

## 2013-07-04 DIAGNOSIS — M1710 Unilateral primary osteoarthritis, unspecified knee: Secondary | ICD-10-CM

## 2013-07-04 DIAGNOSIS — K760 Fatty (change of) liver, not elsewhere classified: Secondary | ICD-10-CM

## 2013-07-04 DIAGNOSIS — K219 Gastro-esophageal reflux disease without esophagitis: Secondary | ICD-10-CM

## 2013-07-04 DIAGNOSIS — Z23 Encounter for immunization: Secondary | ICD-10-CM

## 2013-07-04 DIAGNOSIS — E669 Obesity, unspecified: Secondary | ICD-10-CM

## 2013-07-04 NOTE — Progress Notes (Signed)
Patient ID: [name] MRN: [id number], DOB: 22-Mar-1946, 67 y.o. Date of Encounter: @DATE @  Chief Complaint:  Chief Complaint  Patient presents with  . Medication Refill    Pt fasting    HPI: 67 y.o. year old obese white female  presents for routine followup office visit.  Since her last office visit with me she did undergo a laparoscopic cholecystectomy. Otherwise she has been doing well. She has no complaints today.  Diabetes: She reports that she still never checks her blood sugar. At recent visit I discussed with her that her A1c's have been somewhat low. When asked if she had any symptoms of hypoglycemia she stated that sometimes around 6 PM she would feel a little bit shaky.She would Not check her blood sugar but she would eat something and her symptoms would resolve. We then discussed what time she was eating. She was eating breakfast at 8:30. Lunch at noon. Then no dinner until after 6 PM. Discussed that she must eat a healthy snack at 3 PM. She says that she is doing this. Says that she eats a yogurt every day at 3 PM. She is having no further symptoms of hypoglycemia. No symptoms of shakiness and no never needing to go to eat food. However she continues not to check blood sugars. In the past she had been walking 30-60 minutes almost every day. However at last office visit she said she was going through a lot of stress with her granddaughter here given birth to a baby who was in the NICU. Today she says that the granddaughter and baby are both doing fine. However she currently is only walking about 2-3 days per week and only walking for about 20-30 minutes. She mentions that it has to do with the weather. I asked her what she did last winter and she says last winter she would sometimes go walk in the mall. She walked outside and was is really cold.  Hypertension: At last office visit and increase clonidine to 0.3 mg. She says that she did make this change. She is taking all  blood pressure medications as directed. She is having no adverse effects. No significant lower stomach edema now since we decreased Norvasc from 10-5 mg. He has no lightheadedness.  Hyperlipidemia: Taking Lipitor with no adverse effects or myalgias.   Past Medical History  Diagnosis Date  . Hypertension   . Normal cardiac stress test     pt can't remember when or where  . Diabetes mellitus without complication     Type 2 NIDDM x 15 years  . Arthritis     osteoarthritis  . Hyperlipidemia   . Hormone replacement therapy (postmenopausal)   . Fatty liver disease, nonalcoholic 2010    hospitalized, MRCP dx fatty liver  . Rheumatoid arthritis(714.0) 09/28/2012  . Obesity, unspecified 12/27/2012  . GERD (gastroesophageal reflux disease)   . Chronic cholecystitis with calculus 05/04/2013  . Unspecified essential hypertension 05/04/2013  . Diabetes type 2, controlled 05/04/2013     Home Meds: See attached medication section for current medication list. Any medications entered into computer today will not appear on this note's list. The medications listed below were entered prior to today. Current Outpatient Prescriptions on File Prior to Visit  Medication Sig Dispense Refill  . amLODipine (NORVASC) 5 MG tablet Take 1 tablet (5 mg total) by mouth daily.  90 tablet  3  . aspirin EC 81 MG tablet Take 81 mg by mouth daily.      Marland Kitchen  atorvastatin (LIPITOR) 40 MG tablet TAKE 1 TABLET DAILY  90 tablet  1  . Calcium Carbonate-Vitamin D (CALCIUM 600 + D PO) Take 1 tablet by mouth 2 (two) times daily.      . cloNIDine (CATAPRES) 0.3 MG tablet Take 1 tablet (0.3 mg total) by mouth 2 (two) times daily.  180 tablet  3  . estradiol (ESTRACE) 0.5 MG tablet Take 0.5 mg by mouth 2 (two) times daily.      . fish oil-omega-3 fatty acids 1000 MG capsule Take 1 g by mouth at bedtime.       . folic acid (FOLVITE) 1 MG tablet Take 1 tablet by mouth at bedtime.       Marland Kitchen losartan-hydrochlorothiazide (HYZAAR) 100-12.5 MG  per tablet Take 1 tablet by mouth daily.  90 tablet  3  . metFORMIN (GLUCOPHAGE) 1000 MG tablet TAKE ONE TABLET BY MOUTH TWICE DAILY  180 tablet  0  . methotrexate (RHEUMATREX) 2.5 MG tablet Take 8 tablets by mouth once a week.      . metoprolol tartrate (LOPRESSOR) 25 MG tablet Take 12.5 mg by mouth 2 (two) times daily.      . Multiple Vitamins-Minerals (MULTIVITAMINS THER. W/MINERALS) TABS Take 1 tablet by mouth daily.      Marland Kitchen omeprazole (PRILOSEC) 20 MG capsule Take 1 capsule (20 mg total) by mouth daily.  90 capsule  3   No current facility-administered medications on file prior to visit.    Allergies:  Allergies  Allergen Reactions  . Monopril [Fosinopril] Cough    History   Social History  . Marital Status: Divorced    Spouse Name: N/A    Number of Children: N/A  . Years of Education: N/A   Occupational History  . Not on file.   Social History Main Topics  . Smoking status: Former Smoker    Types: Cigarettes  . Smokeless tobacco: Never Used     Comment: quit smoking regularly in her 30's; now has an occasional cigarette  . Alcohol Use: Yes     Comment: occasional  . Drug Use: No  . Sexual Activity: No   Other Topics Concern  . Not on file   Social History Narrative  . No narrative on file    Family History  Problem Relation Age of Onset  . Diabetes Other   . Heart disease Other   . Cancer Mother     breast  . Heart attack Mother   . Stroke Father      Review of Systems:  See HPI for pertinent ROS. All other ROS negative.    Physical Exam: Blood pressure 130/86, pulse 60, temperature 97 F (36.1 C), temperature source Oral, resp. rate 14, height 5' (1.524 m), weight 166 lb (75.297 kg)., Body mass index is 32.42 kg/(m^2). General: Obese WF.  Appears in no acute distress. Neck: Supple. No thyromegaly. No lymphadenopathy. No carotid bruits. Lungs: Clear bilaterally to auscultation without wheezes, rales, or rhonchi. Breathing is unlabored. Heart: RRR  with S1 S2. No murmurs, rubs, or gallops. Abdomen: Soft, non-tender, non-distended with normoactive bowel sounds. No hepatomegaly. No rebound/guarding. No obvious abdominal masses. Musculoskeletal:  Strength and tone normal for age. Extremities/Skin: Warm and dry. No clubbing or cyanosis. No edema. No rashes or suspicious lesions. Neuro: Alert and oriented X 3. Moves all extremities spontaneously. Gait is normal. CNII-XII grossly in tact. Psych:  Responds to questions appropriately with a normal affect. Diabetic foot exam: Inspection is normal. There were no deformities,  no ulcerations, no other skin breakdown bilaterally. Station testing is normal. On the left: Dorsalis pedis 2+. Posterior tibialis 1+.  Right:  no palpable dorsalis pedis or posterior tibial.      ASSESSMENT AND PLAN:  67 y.o. year old female with  1. Diabetes type 2, controlled - Hemoglobin A1C, fingerstick  Her last A1c was 5.6. She did stop the glipizide/Tegretol as directed. She did continue metformin at current dose as directed. If A1c remains low will decrease metformin. Microalbumin: Done 03/29/12. Only minimally elevated at 2.47. She is on ARB. She is intolerant to ACE inhibitor secondary to cough. Her blood pressure and blood sugar is controlled. She is on ARB therapy. Intolerant to ACE inhibitor secondary to cough. She is on statin therapy with LDL to goal. < 70. She just saw ophthalmologist 1 week ago. Had diabetic eye exam. She states that she had not been having annual exams we will start doing so. Her foot exam is good.   2. Hyperlipidemia 03/29/2013 LFTs were normal. ALL Lipid parameters were excellent with LDL at 70.  3. H/O Fatty liver disease, nonalcoholic Last Set of LFTs were normal.  4. Essential hypertension, benign Blood pressures now at goal. 130/86 today. Continue current medications. BMET was normal 03/29/2013.  5. Obesity, unspecified She is aware of properly carbohydrate low-fat diet. I  have discussed her increasing her exercise to get back to the level that she was doing last year at this time of walking almost every day of the week for 30-60 minutes.  6. GERD (gastroesophageal reflux disease)  7. Hormone replacement therapy (postmenopausal) Per Gyn.  8. Rheumatoid arthritis(714.0) Per Dr. Corliss Skains.  9. Osteoarthritis of right knee Per ortho. She has had surgery to this.  10. Colonoscopy: 04/30/2011 colon polyp. Reports says repeat 5-10 years. Patient says she was told to repeat 10 years.  11. Pap, mammogram, BMD: Sees GYN annually and they manage this.  12. Immunizations: Tdap: 05/2011 Pneumovax: 05/2011 I discussed the new guidelines with her today. She is agreeable to go ahead into the Prevnar 13 today. Influenza vaccine: She states she has had that for this year. Zostavax: 09/2011.   Signed, [address] [place], [place], [facility] 07/04/2013 8:40 AM

## 2013-07-06 ENCOUNTER — Encounter: Payer: Self-pay | Admitting: *Deleted

## 2013-07-23 ENCOUNTER — Other Ambulatory Visit: Payer: Self-pay | Admitting: Physician Assistant

## 2013-07-23 NOTE — Telephone Encounter (Signed)
Medication refilled per protocol. 

## 2013-09-14 ENCOUNTER — Encounter: Payer: Self-pay | Admitting: Family Medicine

## 2013-09-14 DIAGNOSIS — M7551 Bursitis of right shoulder: Secondary | ICD-10-CM | POA: Insufficient documentation

## 2013-10-04 ENCOUNTER — Ambulatory Visit: Payer: BC Managed Care – PPO | Admitting: Physician Assistant

## 2013-10-14 ENCOUNTER — Emergency Department (HOSPITAL_COMMUNITY)
Admission: EM | Admit: 2013-10-14 | Discharge: 2013-10-15 | Disposition: A | Discharge status: Home / Self Care | Payer: BC Managed Care – PPO | Attending: Emergency Medicine | Admitting: Emergency Medicine

## 2013-10-14 DIAGNOSIS — R5383 Other fatigue: Secondary | ICD-10-CM

## 2013-10-14 DIAGNOSIS — R5381 Other malaise: Secondary | ICD-10-CM

## 2013-10-14 DIAGNOSIS — E669 Obesity, unspecified: Secondary | ICD-10-CM | POA: Insufficient documentation

## 2013-10-14 DIAGNOSIS — M1990 Unspecified osteoarthritis, unspecified site: Secondary | ICD-10-CM | POA: Insufficient documentation

## 2013-10-14 DIAGNOSIS — Z7982 Long term (current) use of aspirin: Secondary | ICD-10-CM | POA: Insufficient documentation

## 2013-10-14 DIAGNOSIS — E119 Type 2 diabetes mellitus without complications: Secondary | ICD-10-CM | POA: Insufficient documentation

## 2013-10-14 DIAGNOSIS — E876 Hypokalemia: Secondary | ICD-10-CM

## 2013-10-14 DIAGNOSIS — N390 Urinary tract infection, site not specified: Secondary | ICD-10-CM

## 2013-10-14 DIAGNOSIS — E785 Hyperlipidemia, unspecified: Secondary | ICD-10-CM | POA: Insufficient documentation

## 2013-10-14 DIAGNOSIS — Z87891 Personal history of nicotine dependence: Secondary | ICD-10-CM | POA: Insufficient documentation

## 2013-10-14 DIAGNOSIS — E860 Dehydration: Secondary | ICD-10-CM

## 2013-10-14 DIAGNOSIS — K219 Gastro-esophageal reflux disease without esophagitis: Secondary | ICD-10-CM | POA: Insufficient documentation

## 2013-10-14 DIAGNOSIS — M069 Rheumatoid arthritis, unspecified: Secondary | ICD-10-CM | POA: Insufficient documentation

## 2013-10-14 DIAGNOSIS — Z79899 Other long term (current) drug therapy: Secondary | ICD-10-CM | POA: Insufficient documentation

## 2013-10-14 DIAGNOSIS — I10 Essential (primary) hypertension: Secondary | ICD-10-CM | POA: Insufficient documentation

## 2013-10-14 NOTE — ED Notes (Signed)
Pt is from home. Pt reports that she has been having nausea, vomiting and diarrhea x 1 month. Pt states that she has not seen a doctor because she hoped her symptoms would pass. EMS reports attempting to obtain orthostatic vitals but was unable to complete, because the patient would start to lose consciousness upon standing. EMS reported that the pt was tachycardic en route, with a heart rate between 120 and 150. Pt received 4 mg Zofran en route.

## 2013-10-15 ENCOUNTER — Encounter (HOSPITAL_COMMUNITY): Payer: Self-pay | Admitting: Emergency Medicine

## 2013-10-15 ENCOUNTER — Emergency Department (HOSPITAL_COMMUNITY): Payer: BC Managed Care – PPO

## 2013-10-15 LAB — COMPREHENSIVE METABOLIC PANEL
ALT: 45 U/L — ABNORMAL HIGH (ref 0–35)
AST: 63 U/L — ABNORMAL HIGH (ref 0–37)
Albumin: 3 g/dL — ABNORMAL LOW (ref 3.5–5.2)
Alkaline Phosphatase: 164 U/L — ABNORMAL HIGH (ref 39–117)
BILIRUBIN TOTAL: 0.6 mg/dL (ref 0.3–1.2)
BUN: 17 mg/dL (ref 6–23)
CALCIUM: 8.2 mg/dL — AB (ref 8.4–10.5)
CO2: 20 meq/L (ref 19–32)
CREATININE: 0.74 mg/dL (ref 0.50–1.10)
Chloride: 103 mEq/L (ref 96–112)
GFR calc non Af Amer: 85 mL/min — ABNORMAL LOW (ref >90)
Glucose, Bld: 95 mg/dL (ref 70–99)
Potassium: 3 mEq/L — ABNORMAL LOW (ref 3.7–5.3)
Sodium: 142 mEq/L (ref 137–147)
Total Protein: 6.5 g/dL (ref 6.0–8.3)

## 2013-10-15 LAB — URINALYSIS, ROUTINE W REFLEX MICROSCOPIC
GLUCOSE, UA: NEGATIVE mg/dL
Hgb urine dipstick: NEGATIVE
KETONES UR: 40 mg/dL — AB
NITRITE: NEGATIVE
Protein, ur: 30 mg/dL — AB
Specific Gravity, Urine: 1.022 (ref 1.005–1.030)
Urobilinogen, UA: 1 mg/dL (ref 0.0–1.0)
pH: 6 (ref 5.0–8.0)

## 2013-10-15 LAB — I-STAT ARTERIAL BLOOD GAS, ED
Acid-base deficit: 3 mmol/L — ABNORMAL HIGH (ref 0.0–2.0)
Bicarbonate: 21.5 mEq/L (ref 20.0–24.0)
O2 Saturation: 91 %
PCO2 ART: 38.4 mmHg (ref 35.0–45.0)
PO2 ART: 64 mmHg — AB (ref 80.0–100.0)
Patient temperature: 99.7
TCO2: 23 mmol/L (ref 0–100)
pH, Arterial: 7.359 (ref 7.350–7.450)

## 2013-10-15 LAB — CBC WITH DIFFERENTIAL/PLATELET
Basophils Absolute: 0 10*3/uL (ref 0.0–0.1)
Basophils Relative: 0 % (ref 0–1)
EOS PCT: 1 % (ref 0–5)
Eosinophils Absolute: 0.1 10*3/uL (ref 0.0–0.7)
HEMATOCRIT: 35.4 % — AB (ref 36.0–46.0)
HEMOGLOBIN: 11.6 g/dL — AB (ref 12.0–15.0)
LYMPHS PCT: 10 % — AB (ref 12–46)
Lymphs Abs: 0.7 10*3/uL (ref 0.7–4.0)
MCH: 31.7 pg (ref 26.0–34.0)
MCHC: 32.8 g/dL (ref 30.0–36.0)
MCV: 96.7 fL (ref 78.0–100.0)
MONO ABS: 0.3 10*3/uL (ref 0.1–1.0)
MONOS PCT: 4 % (ref 3–12)
NEUTROS ABS: 6.5 10*3/uL (ref 1.7–7.7)
Neutrophils Relative %: 85 % — ABNORMAL HIGH (ref 43–77)
Platelets: 154 10*3/uL (ref 150–400)
RBC: 3.66 MIL/uL — ABNORMAL LOW (ref 3.87–5.11)
RDW: 16.3 % — ABNORMAL HIGH (ref 11.5–15.5)
WBC: 7.7 10*3/uL (ref 4.0–10.5)

## 2013-10-15 LAB — URINE MICROSCOPIC-ADD ON

## 2013-10-15 LAB — TROPONIN I: Troponin I: <0.3 ng/mL (ref <0.30)

## 2013-10-15 LAB — TSH: TSH: 1.52 u[IU]/mL (ref 0.350–4.500)

## 2013-10-15 LAB — LIPASE, BLOOD: LIPASE: 19 U/L (ref 11–59)

## 2013-10-15 MED ORDER — ONDANSETRON HCL 4 MG/2ML IJ SOLN
4.0000 mg | Freq: Once | INTRAMUSCULAR | Status: AC
  Administered 2013-10-15: 4 mg via INTRAVENOUS
  Filled 2013-10-15: qty 2

## 2013-10-15 MED ORDER — CEPHALEXIN 500 MG PO CAPS: 500.0000 mg | ORAL_CAPSULE | Freq: Three times a day (TID) | ORAL | Status: DC

## 2013-10-15 MED ORDER — ONDANSETRON HCL 4 MG PO TABS: 4.0000 mg | ORAL_TABLET | Freq: Four times a day (QID) | ORAL | Status: DC

## 2013-10-15 MED ORDER — POTASSIUM CHLORIDE CRYS ER 20 MEQ PO TBCR
40.0000 meq | EXTENDED_RELEASE_TABLET | Freq: Once | ORAL | Status: AC
  Administered 2013-10-15: 40 meq via ORAL
  Filled 2013-10-15: qty 2

## 2013-10-15 MED ORDER — SODIUM CHLORIDE 0.9 % IV BOLUS (SEPSIS)
1000.0000 mL | Freq: Once | INTRAVENOUS | Status: AC
  Administered 2013-10-15: 1000 mL via INTRAVENOUS

## 2013-10-15 MED ORDER — DEXTROSE 5 % IV SOLN
1.0000 g | Freq: Once | INTRAVENOUS | Status: AC
  Administered 2013-10-15: 1 g via INTRAVENOUS
  Filled 2013-10-15: qty 10

## 2013-10-15 NOTE — ED Provider Notes (Signed)
CSN: [id number]     Arrival date & time 10/14/13  2351 History   First MD Initiated Contact with Patient 10/15/13 0021     Chief Complaint  Patient presents with  . Nausea  . Diarrhea  . Emesis     (Consider location/radiation/quality/duration/timing/severity/associated sxs/prior Treatment) HPI This is a 68 yo type 2 diabetic with HTN, HLD, RA, GERD who presents with complaints of intermittent nausea, vomiting and diarrhea. The diarrhea seems to come on for a couple of days and then go away. The patient has persistent generalized weakness. She has a lack of appetite and says that even looking at food makes her feel nauseated. She estimates that she has lost about 10 lbs in the past month. She denies night sweats.   She had an unusual number of episodes of nausea and vomiting today along with diarrhea and says she has not been able to tolerate any po intake all day long. She was so weak this evening after having an episode of diarrhea stool, that she was unable to get off the toilet.   Past Medical History  Diagnosis Date  . Hypertension   . Normal cardiac stress test     pt can't remember when or where  . Diabetes mellitus without complication     Type 2 NIDDM x 15 years  . Arthritis     osteoarthritis  . Hyperlipidemia   . Hormone replacement therapy (postmenopausal)   . Fatty liver disease, nonalcoholic 9371    hospitalized, MRCP dx fatty liver  . Rheumatoid arthritis(714.0) 09/28/2012  . Obesity, unspecified 12/27/2012  . GERD (gastroesophageal reflux disease)   . Chronic cholecystitis with calculus 05/04/2013  . Unspecified essential hypertension 05/04/2013  . Diabetes type 2, controlled 05/04/2013  . Bursitis of right shoulder    Past Surgical History  Procedure Laterality Date  . Joint replacement      left knee  . Abdominal hysterectomy    . Tonsillectomy    . Nasal sinus surgery    . Total knee arthroplasty  08/01/2012    Procedure: TOTAL KNEE ARTHROPLASTY;  Surgeon:  Meredith Pel, MD;  Location: [facility];  Service: Orthopedics;  Laterality: Right;  Right total knee arthroplasty  . Total knee arthroplasty  08/01/2012    RIGHT  KNEE  . Ercp N/A 05/02/2013    Procedure: ENDOSCOPIC RETROGRADE CHOLANGIOPANCREATOGRAPHY (ERCP);  Surgeon: Beryle Beams, MD;  Location: [facility];  Service: Endoscopy;  Laterality: N/A;  . Sphincterotomy  05/02/2013    Procedure: SPHINCTEROTOMY;  Surgeon: Beryle Beams, MD;  Location: [facility];  Service: Endoscopy;;  . Cholecystectomy N/A 05/04/2013    Procedure: LAPAROSCOPIC CHOLECYSTECTOMY WITH INTRAOPERATIVE CHOLANGIOGRAM;  Surgeon: Haywood Lasso, MD;  Location: [facility];  Service: General;  Laterality: N/A;   Family History  Problem Relation Age of Onset  . Diabetes Other   . Heart disease Other   . Cancer Mother     breast  . Heart attack Mother   . Stroke Father    History  Substance Use Topics  . Smoking status: Former Smoker    Types: Cigarettes  . Smokeless tobacco: Never Used     Comment: quit smoking regularly in her 30's; now has an occasional cigarette  . Alcohol Use: Yes     Comment: occasional   OB History   Grav Para Term Preterm Abortions TAB SAB Ect Mult Living                 Review  of Systems Ten point review of symptoms performed and is negative with the exception of symptoms noted above and below "always cold". "no energy". "comes home from work and stays in the bed all weekend"    Allergies  Monopril and Norvasc  Home Medications   Current Outpatient Rx  Name  Route  Sig  Dispense  Refill  . amLODipine (NORVASC) 5 MG tablet   Oral   Take 1 tablet (5 mg total) by mouth daily.   90 tablet   3   . aspirin EC 81 MG tablet   Oral   Take 81 mg by mouth daily.         Marland Kitchen atorvastatin (LIPITOR) 40 MG tablet      TAKE 1 TABLET DAILY   90 tablet   1   . Calcium Carbonate-Vitamin D (CALCIUM 600 + D PO)   Oral   Take 1 tablet by mouth 2 (two) times daily.         .  cloNIDine (CATAPRES) 0.3 MG tablet   Oral   Take 1 tablet (0.3 mg total) by mouth 2 (two) times daily.   180 tablet   3   . diclofenac sodium (VOLTAREN) 1 % GEL   Topical   Apply 3 g topically 3 (three) times daily. To 3 large joints         . estradiol (ESTRACE) 0.5 MG tablet   Oral   Take 0.5 mg by mouth 2 (two) times daily.         . fish oil-omega-3 fatty acids 1000 MG capsule   Oral   Take 1 g by mouth at bedtime.          . folic acid (FOLVITE) 1 MG tablet   Oral   Take 1 tablet by mouth at bedtime.          Marland Kitchen losartan-hydrochlorothiazide (HYZAAR) 100-12.5 MG per tablet   Oral   Take 1 tablet by mouth daily.   90 tablet   3   . metFORMIN (GLUCOPHAGE) 1000 MG tablet      TAKE ONE TABLET BY MOUTH TWICE DAILY   180 tablet   1   . methotrexate (RHEUMATREX) 2.5 MG tablet   Oral   Take 8 tablets by mouth once a week.         . metoprolol tartrate (LOPRESSOR) 25 MG tablet   Oral   Take 12.5 mg by mouth 2 (two) times daily.         . Multiple Vitamins-Minerals (MULTIVITAMINS THER. W/MINERALS) TABS   Oral   Take 1 tablet by mouth daily.         Marland Kitchen omeprazole (PRILOSEC) 20 MG capsule   Oral   Take 1 capsule (20 mg total) by mouth daily.   90 capsule   3    BP 148/81  Temp(Src) 99.8 F (37.7 C) (Oral)  Resp 16  Ht 5' (1.524 m)  Wt 150 lb (68.04 kg)  BMI 29.30 kg/m2  SpO2 96% Physical Exam Gen: well developed and well nourished appearing, generally weak appearing Head: NCAT Eyes: PERL, EOMI Nose: no epistaixis or rhinorrhea Mouth/throat: mucosa is moist and pink Neck: supple, no stridor Lungs: CTA B, no wheezing, rhonchi or rales CV: rapid (108) rate and rythm, good distal pulses.  Patient noted to have a rise in BP from 108 to 128 when sitting upright from recumbent position Abd: soft, notender, nondistended Back: no ttp, no cva ttp Skin: warm and dry Ext:  no edema, normal to inspection Neuro: CN ii-xii grossly intact, no focal  deficits, normal Psyche; flat affect,  calm and cooperative.  ED Course  Procedures (including critical care time) Labs Review  Results for orders placed during the hospital encounter of 10/14/13 (from the past 24 hour(s))  CBC WITH DIFFERENTIAL     Status: Abnormal   Collection Time    10/15/13 12:39 AM      Result Value Ref Range   WBC 7.7  4.0 - 10.5 K/uL   RBC 3.66 (*) 3.87 - 5.11 MIL/uL   Hemoglobin 11.6 (*) 12.0 - 15.0 g/dL   HCT 35.4 (*) 36.0 - 46.0 %   MCV 96.7  78.0 - 100.0 fL   MCH 31.7  26.0 - 34.0 pg   MCHC 32.8  30.0 - 36.0 g/dL   RDW 16.3 (*) 11.5 - 15.5 %   Platelets 154  150 - 400 K/uL   Neutrophils Relative % 85 (*) 43 - 77 %   Neutro Abs 6.5  1.7 - 7.7 K/uL   Lymphocytes Relative 10 (*) 12 - 46 %   Lymphs Abs 0.7  0.7 - 4.0 K/uL   Monocytes Relative 4  3 - 12 %   Monocytes Absolute 0.3  0.1 - 1.0 K/uL   Eosinophils Relative 1  0 - 5 %   Eosinophils Absolute 0.1  0.0 - 0.7 K/uL   Basophils Relative 0  0 - 1 %   Basophils Absolute 0.0  0.0 - 0.1 K/uL  COMPREHENSIVE METABOLIC PANEL     Status: Abnormal   Collection Time    10/15/13 12:39 AM      Result Value Ref Range   Sodium 142  137 - 147 mEq/L   Potassium 3.0 (*) 3.7 - 5.3 mEq/L   Chloride 103  96 - 112 mEq/L   CO2 20  19 - 32 mEq/L   Glucose, Bld 95  70 - 99 mg/dL   BUN 17  6 - 23 mg/dL   Creatinine, Ser 0.74  0.50 - 1.10 mg/dL   Calcium 8.2 (*) 8.4 - 10.5 mg/dL   Total Protein 6.5  6.0 - 8.3 g/dL   Albumin 3.0 (*) 3.5 - 5.2 g/dL   AST 63 (*) 0 - 37 U/L   ALT 45 (*) 0 - 35 U/L   Alkaline Phosphatase 164 (*) 39 - 117 U/L   Total Bilirubin 0.6  0.3 - 1.2 mg/dL   GFR calc non Af Amer 85 (*) >90 mL/min   GFR calc Af Amer >90  >90 mL/min  LIPASE, BLOOD     Status: None   Collection Time    10/15/13 12:39 AM      Result Value Ref Range   Lipase 19  11 - 59 U/L  URINALYSIS, ROUTINE W REFLEX MICROSCOPIC     Status: Abnormal   Collection Time    10/15/13  1:09 AM      Result Value Ref Range    Color, Urine AMBER (*) YELLOW   APPearance CLOUDY (*) CLEAR   Specific Gravity, Urine 1.022  1.005 - 1.030   pH 6.0  5.0 - 8.0   Glucose, UA NEGATIVE  NEGATIVE mg/dL   Hgb urine dipstick NEGATIVE  NEGATIVE   Bilirubin Urine SMALL (*) NEGATIVE   Ketones, ur 40 (*) NEGATIVE mg/dL   Protein, ur 30 (*) NEGATIVE mg/dL   Urobilinogen, UA 1.0  0.0 - 1.0 mg/dL   Nitrite NEGATIVE  NEGATIVE   Leukocytes,  UA LARGE (*) NEGATIVE  URINE MICROSCOPIC-ADD ON     Status: Abnormal   Collection Time    10/15/13  1:09 AM      Result Value Ref Range   Squamous Epithelial / LPF MANY (*) RARE   WBC, UA 11-20  <3 WBC/hpf   Bacteria, UA MANY (*) RARE     MDM   DDX: Dehydration, electrolyte disturbance, pneumonia, urinary tract infection, kidney failure, viral gastroenteritis. Adverse drug reaction.  We're managing symptomatically at this time with IV fluid resuscitation and antiemetic. Labs and chest x-ray pending.  0315: patient re-evaluated. Still tachycardic at rest but, less so with resting pulse 103, BP wnl. Patient says she is feeling somewhat better. Found to have a UTI which we are treating empirically with Ceftriaxone.. Will tx with a 2nd liter of NS. Anticipate that the patient will be safe for discharge to home with abx. However, I have counseled the patient as well as her family members that it is very important for her to f/u with her PCP this week for further evaluation of anorexia and weight loss.   0515: Patient sitting up  In bed eating saltine crackers and sipping gingerale. Looks better and says she feels better. Still mildly tachycardic with sinus rhythm.  I do not believe that the patient will benefit from hospitalization. She is stable for d/c and counseled re: return precautions. She will schedule f/u for recheck with her PCP in the next 1 to 2 days.   Elyn Peers, MD 10/15/13 [phone]

## 2013-10-15 NOTE — ED Notes (Signed)
Pt states that she is having pain, but would not like medication at this time.

## 2013-10-15 NOTE — ED Notes (Signed)
Pt states that all she has eaten today is some yogurt and a sprite. Pt states that she was able to keep the food down, but it made her nauseous.

## 2013-10-16 ENCOUNTER — Ambulatory Visit (INDEPENDENT_AMBULATORY_CARE_PROVIDER_SITE_OTHER): Payer: BC Managed Care – PPO | Admitting: Family Medicine

## 2013-10-16 ENCOUNTER — Encounter: Payer: Self-pay | Admitting: Family Medicine

## 2013-10-16 ENCOUNTER — Encounter (HOSPITAL_COMMUNITY): Payer: Self-pay | Admitting: Emergency Medicine

## 2013-10-16 ENCOUNTER — Inpatient Hospital Stay (HOSPITAL_COMMUNITY)
Admission: EM | Admit: 2013-10-16 | Discharge: 2013-10-21 | DRG: 641 | Disposition: A | Discharge status: Home / Self Care | Payer: BC Managed Care – PPO | Attending: Family Medicine | Admitting: Family Medicine

## 2013-10-16 ENCOUNTER — Emergency Department (HOSPITAL_COMMUNITY): Payer: BC Managed Care – PPO

## 2013-10-16 VITALS — BP 170/90 | HR 98 | Temp 99.3°F | Resp 24 | Ht 60.0 in | Wt 148.0 lb

## 2013-10-16 DIAGNOSIS — Z79890 Hormone replacement therapy (postmenopausal): Secondary | ICD-10-CM

## 2013-10-16 DIAGNOSIS — E669 Obesity, unspecified: Secondary | ICD-10-CM

## 2013-10-16 DIAGNOSIS — K760 Fatty (change of) liver, not elsewhere classified: Secondary | ICD-10-CM

## 2013-10-16 DIAGNOSIS — Z8249 Family history of ischemic heart disease and other diseases of the circulatory system: Secondary | ICD-10-CM

## 2013-10-16 DIAGNOSIS — I10 Essential (primary) hypertension: Secondary | ICD-10-CM

## 2013-10-16 DIAGNOSIS — M1711 Unilateral primary osteoarthritis, right knee: Secondary | ICD-10-CM

## 2013-10-16 DIAGNOSIS — M069 Rheumatoid arthritis, unspecified: Secondary | ICD-10-CM

## 2013-10-16 DIAGNOSIS — E876 Hypokalemia: Secondary | ICD-10-CM

## 2013-10-16 DIAGNOSIS — E785 Hyperlipidemia, unspecified: Secondary | ICD-10-CM

## 2013-10-16 DIAGNOSIS — R1110 Vomiting, unspecified: Secondary | ICD-10-CM

## 2013-10-16 DIAGNOSIS — R7401 Elevation of levels of liver transaminase levels: Secondary | ICD-10-CM | POA: Diagnosis present

## 2013-10-16 DIAGNOSIS — K529 Noninfective gastroenteritis and colitis, unspecified: Secondary | ICD-10-CM

## 2013-10-16 DIAGNOSIS — Z823 Family history of stroke: Secondary | ICD-10-CM

## 2013-10-16 DIAGNOSIS — Z833 Family history of diabetes mellitus: Secondary | ICD-10-CM

## 2013-10-16 DIAGNOSIS — N390 Urinary tract infection, site not specified: Secondary | ICD-10-CM | POA: Diagnosis present

## 2013-10-16 DIAGNOSIS — R509 Fever, unspecified: Secondary | ICD-10-CM | POA: Diagnosis present

## 2013-10-16 DIAGNOSIS — R740 Nonspecific elevation of levels of transaminase and lactic acid dehydrogenase [LDH]: Secondary | ICD-10-CM

## 2013-10-16 DIAGNOSIS — Z96659 Presence of unspecified artificial knee joint: Secondary | ICD-10-CM

## 2013-10-16 DIAGNOSIS — Z7982 Long term (current) use of aspirin: Secondary | ICD-10-CM

## 2013-10-16 DIAGNOSIS — E860 Dehydration: Principal | ICD-10-CM

## 2013-10-16 DIAGNOSIS — M67919 Unspecified disorder of synovium and tendon, unspecified shoulder: Secondary | ICD-10-CM

## 2013-10-16 DIAGNOSIS — K859 Acute pancreatitis without necrosis or infection, unspecified: Secondary | ICD-10-CM

## 2013-10-16 DIAGNOSIS — R7402 Elevation of levels of lactic acid dehydrogenase (LDH): Secondary | ICD-10-CM | POA: Diagnosis present

## 2013-10-16 DIAGNOSIS — M7551 Bursitis of right shoulder: Secondary | ICD-10-CM

## 2013-10-16 DIAGNOSIS — E119 Type 2 diabetes mellitus without complications: Secondary | ICD-10-CM

## 2013-10-16 DIAGNOSIS — K219 Gastro-esophageal reflux disease without esophagitis: Secondary | ICD-10-CM

## 2013-10-16 DIAGNOSIS — Z87891 Personal history of nicotine dependence: Secondary | ICD-10-CM

## 2013-10-16 DIAGNOSIS — M719 Bursopathy, unspecified: Secondary | ICD-10-CM

## 2013-10-16 DIAGNOSIS — K5289 Other specified noninfective gastroenteritis and colitis: Secondary | ICD-10-CM | POA: Diagnosis present

## 2013-10-16 DIAGNOSIS — K5900 Constipation, unspecified: Secondary | ICD-10-CM | POA: Diagnosis present

## 2013-10-16 HISTORY — DX: Noninfective gastroenteritis and colitis, unspecified: K52.9

## 2013-10-16 LAB — COMPREHENSIVE METABOLIC PANEL
ALK PHOS: 155 U/L — AB (ref 39–117)
ALT: 39 U/L — ABNORMAL HIGH (ref 0–35)
AST: 43 U/L — ABNORMAL HIGH (ref 0–37)
Albumin: 3.8 g/dL (ref 3.5–5.2)
BILIRUBIN TOTAL: 0.5 mg/dL (ref 0.3–1.2)
BUN: 14 mg/dL (ref 6–23)
CHLORIDE: 100 meq/L (ref 96–112)
CO2: 18 mEq/L — ABNORMAL LOW (ref 19–32)
Calcium: 8.9 mg/dL (ref 8.4–10.5)
Creatinine, Ser: 0.76 mg/dL (ref 0.50–1.10)
GFR calc non Af Amer: 84 mL/min — ABNORMAL LOW (ref >90)
GLUCOSE: 137 mg/dL — AB (ref 70–99)
Potassium: 3.2 mEq/L — ABNORMAL LOW (ref 3.7–5.3)
Sodium: 145 mEq/L (ref 137–147)
Total Protein: 7.9 g/dL (ref 6.0–8.3)

## 2013-10-16 LAB — CBC WITH DIFFERENTIAL/PLATELET
Basophils Absolute: 0.1 10*3/uL (ref 0.0–0.1)
Basophils Relative: 1 % (ref 0–1)
Eosinophils Absolute: 0 10*3/uL (ref 0.0–0.7)
Eosinophils Relative: 0 % (ref 0–5)
HEMATOCRIT: 39 % (ref 36.0–46.0)
HEMOGLOBIN: 12.1 g/dL (ref 12.0–15.0)
LYMPHS ABS: 1.1 10*3/uL (ref 0.7–4.0)
Lymphocytes Relative: 22 % (ref 12–46)
MCH: 30.4 pg (ref 26.0–34.0)
MCHC: 31 g/dL (ref 30.0–36.0)
MCV: 98 fL (ref 78.0–100.0)
MONOS PCT: 12 % (ref 3–12)
Monocytes Absolute: 0.6 10*3/uL (ref 0.1–1.0)
NEUTROS ABS: 3.3 10*3/uL (ref 1.7–7.7)
NEUTROS PCT: 66 % (ref 43–77)
Platelets: 235 10*3/uL (ref 150–400)
RBC: 3.98 MIL/uL (ref 3.87–5.11)
RDW: 16.2 % — ABNORMAL HIGH (ref 11.5–15.5)
WBC: 5.1 10*3/uL (ref 4.0–10.5)

## 2013-10-16 LAB — URINE MICROSCOPIC-ADD ON

## 2013-10-16 LAB — URINALYSIS, ROUTINE W REFLEX MICROSCOPIC
Glucose, UA: NEGATIVE mg/dL
Hgb urine dipstick: NEGATIVE
Ketones, ur: >80 mg/dL — AB
Leukocytes, UA: NEGATIVE
NITRITE: NEGATIVE
Protein, ur: 100 mg/dL — AB
Specific Gravity, Urine: 1.02 (ref 1.005–1.030)
UROBILINOGEN UA: 0.2 mg/dL (ref 0.0–1.0)
pH: 6 (ref 5.0–8.0)

## 2013-10-16 LAB — LIPASE, BLOOD: LIPASE: 12 U/L (ref 11–59)

## 2013-10-16 MED ORDER — ASPIRIN EC 81 MG PO TBEC
81.0000 mg | DELAYED_RELEASE_TABLET | Freq: Every day | ORAL | Status: DC
  Administered 2013-10-18 – 2013-10-21 (×4): 81 mg via ORAL
  Filled 2013-10-16 (×5): qty 1

## 2013-10-16 MED ORDER — IOHEXOL 300 MG/ML  SOLN
100.0000 mL | Freq: Once | INTRAMUSCULAR | Status: AC | PRN
  Administered 2013-10-16: 100 mL via INTRAVENOUS

## 2013-10-16 MED ORDER — METRONIDAZOLE IN NACL 5-0.79 MG/ML-% IV SOLN
500.0000 mg | Freq: Three times a day (TID) | INTRAVENOUS | Status: DC
  Administered 2013-10-17 – 2013-10-21 (×14): 500 mg via INTRAVENOUS
  Filled 2013-10-16 (×16): qty 100

## 2013-10-16 MED ORDER — IOHEXOL 300 MG/ML  SOLN
50.0000 mL | Freq: Once | INTRAMUSCULAR | Status: AC | PRN
  Administered 2013-10-16: 50 mL via ORAL

## 2013-10-16 MED ORDER — LOSARTAN POTASSIUM-HCTZ 100-12.5 MG PO TABS: 1.0000 | ORAL_TABLET | Freq: Every day | ORAL | Status: DC

## 2013-10-16 MED ORDER — HYDROCHLOROTHIAZIDE 25 MG PO TABS
25.0000 mg | ORAL_TABLET | Freq: Every day | ORAL | Status: DC
  Filled 2013-10-16 (×3): qty 1

## 2013-10-16 MED ORDER — PROMETHAZINE HCL 25 MG RE SUPP: 25.0000 mg | Freq: Four times a day (QID) | RECTAL | Status: DC | PRN

## 2013-10-16 MED ORDER — CIPROFLOXACIN IN D5W 400 MG/200ML IV SOLN
400.0000 mg | Freq: Two times a day (BID) | INTRAVENOUS | Status: DC
  Administered 2013-10-17 – 2013-10-21 (×10): 400 mg via INTRAVENOUS
  Filled 2013-10-16 (×11): qty 200

## 2013-10-16 MED ORDER — DICLOFENAC SODIUM 1 % TD GEL
1.0000 | Freq: Three times a day (TID) | TRANSDERMAL | Status: DC
Start: 2013-10-17 — End: 2013-10-21
  Administered 2013-10-18 – 2013-10-20 (×3): 1 via TOPICAL
  Filled 2013-10-16: qty 100

## 2013-10-16 MED ORDER — ESTRADIOL 1 MG PO TABS
0.5000 mg | ORAL_TABLET | Freq: Two times a day (BID) | ORAL | Status: DC
  Administered 2013-10-18: 0.5 mg via ORAL
  Administered 2013-10-19: 12:00:00 via ORAL
  Administered 2013-10-19 – 2013-10-21 (×4): 0.5 mg via ORAL
  Filled 2013-10-16 (×10): qty 0.5

## 2013-10-16 MED ORDER — SODIUM CHLORIDE 0.9 % IV BOLUS (SEPSIS)
1000.0000 mL | Freq: Once | INTRAVENOUS | Status: AC
  Administered 2013-10-16: 1000 mL via INTRAVENOUS

## 2013-10-16 MED ORDER — LOSARTAN POTASSIUM 50 MG PO TABS
100.0000 mg | ORAL_TABLET | Freq: Every day | ORAL | Status: DC
  Administered 2013-10-18 – 2013-10-21 (×4): 100 mg via ORAL
  Filled 2013-10-16 (×5): qty 2

## 2013-10-16 MED ORDER — CLONIDINE HCL 0.3 MG PO TABS
0.3000 mg | ORAL_TABLET | Freq: Two times a day (BID) | ORAL | Status: DC
  Administered 2013-10-17 – 2013-10-21 (×7): 0.3 mg via ORAL
  Filled 2013-10-16 (×11): qty 1

## 2013-10-16 MED ORDER — ONDANSETRON HCL 4 MG/2ML IJ SOLN
4.0000 mg | Freq: Once | INTRAMUSCULAR | Status: AC
  Administered 2013-10-16: 4 mg via INTRAVENOUS
  Filled 2013-10-16: qty 2

## 2013-10-16 MED ORDER — AMLODIPINE BESYLATE 5 MG PO TABS
5.0000 mg | ORAL_TABLET | Freq: Every day | ORAL | Status: DC
  Administered 2013-10-18 – 2013-10-21 (×4): 5 mg via ORAL
  Filled 2013-10-16 (×5): qty 1

## 2013-10-16 MED ORDER — SODIUM CHLORIDE 0.9 % IV SOLN
INTRAVENOUS | Status: DC
  Administered 2013-10-17 – 2013-10-18 (×2): via INTRAVENOUS

## 2013-10-16 MED ORDER — FOLIC ACID 1 MG PO TABS
1.0000 mg | ORAL_TABLET | Freq: Every day | ORAL | Status: DC
  Administered 2013-10-18 – 2013-10-20 (×4): 1 mg via ORAL
  Filled 2013-10-16 (×7): qty 1

## 2013-10-16 MED ORDER — PANTOPRAZOLE SODIUM 40 MG IV SOLR
40.0000 mg | INTRAVENOUS | Status: DC
  Administered 2013-10-16 – 2013-10-20 (×5): 40 mg via INTRAVENOUS
  Filled 2013-10-16 (×6): qty 40

## 2013-10-16 MED ORDER — HYDROMORPHONE HCL PF 1 MG/ML IJ SOLN: 0.5000 mg | INTRAMUSCULAR | Status: DC | PRN

## 2013-10-16 MED ORDER — METOPROLOL TARTRATE 12.5 MG HALF TABLET
12.5000 mg | ORAL_TABLET | Freq: Two times a day (BID) | ORAL | Status: DC
  Administered 2013-10-17 – 2013-10-21 (×7): 12.5 mg via ORAL
  Filled 2013-10-16 (×11): qty 1

## 2013-10-16 MED ORDER — ENOXAPARIN SODIUM 40 MG/0.4ML ~~LOC~~ SOLN
40.0000 mg | SUBCUTANEOUS | Status: DC
  Filled 2013-10-16 (×5): qty 0.4

## 2013-10-16 MED ORDER — ONDANSETRON HCL 4 MG/2ML IJ SOLN
4.0000 mg | Freq: Four times a day (QID) | INTRAMUSCULAR | Status: DC | PRN
  Administered 2013-10-16 – 2013-10-18 (×5): 4 mg via INTRAVENOUS
  Filled 2013-10-16 (×6): qty 2

## 2013-10-16 MED ORDER — POTASSIUM CHLORIDE 10 MEQ/100ML IV SOLN
10.0000 meq | INTRAVENOUS | Status: AC
  Administered 2013-10-16 – 2013-10-17 (×6): 10 meq via INTRAVENOUS
  Filled 2013-10-16 (×6): qty 100

## 2013-10-16 MED ORDER — ONDANSETRON HCL 4 MG/2ML IJ SOLN: 4.0000 mg | Freq: Once | INTRAMUSCULAR | Status: DC

## 2013-10-16 MED ORDER — PROMETHAZINE HCL 25 MG/ML IJ SOLN
12.5000 mg | Freq: Four times a day (QID) | INTRAMUSCULAR | Status: DC | PRN
  Administered 2013-10-16 – 2013-10-17 (×5): 12.5 mg via INTRAVENOUS
  Filled 2013-10-16 (×5): qty 1

## 2013-10-16 NOTE — ED Notes (Signed)
Pt with abdominal pain, n/v/d for 1 month.  Last 3-4 days it has worsened.  Was seen by MD this am and sent here for evaluation of pancreatitis.

## 2013-10-16 NOTE — ED Provider Notes (Signed)
CSN: [id number]     Arrival date & time 10/16/13  1713 History   First MD Initiated Contact with Patient 10/16/13 1731     Chief Complaint  Patient presents with  . Abdominal Pain     (Consider location/radiation/quality/duration/timing/severity/associated sxs/prior Treatment) HPI Comments: 68 year old female sent from her primary care clinic with a chief complaint of vomiting. She states she's been having intermittent vomiting over the last month but his been particularly worse and constant over the last 4 days. She states that she's been unable to keep anything down by mouth in the last couple days. No fevers. She's been also having watery diarrhea. No hematemesis or hematochezia. Has been on mildly sore in her upper abdomen was more significantly tender when examined by her PCP so they sent her here to rule out pancreatitis. She states she's lost about 25 pounds in the last couple months. No urinary symptoms. She was treated for a UTI 2 nights ago in the ER but has been unable to keep down the Keflex. The pain in her abdomen is only mild and only hurts when she is examined. She's had a prior cholecystectomy. She states her PCP sent her over here "for labs and a CT scan".   Past Medical History  Diagnosis Date  . Hypertension   . Normal cardiac stress test     pt can't remember when or where  . Diabetes mellitus without complication     Type 2 NIDDM x 15 years  . Arthritis     osteoarthritis  . Hyperlipidemia   . Hormone replacement therapy (postmenopausal)   . Fatty liver disease, nonalcoholic [id number]    hospitalized, MRCP dx fatty liver  . Rheumatoid arthritis(714.0) 09/28/2012  . Obesity, unspecified 12/27/2012  . GERD (gastroesophageal reflux disease)   . Chronic cholecystitis with calculus 05/04/2013  . Unspecified essential hypertension 05/04/2013  . Diabetes type 2, controlled 05/04/2013  . Bursitis of right shoulder    Past Surgical History  Procedure Laterality Date  . Joint  replacement      left knee  . Abdominal hysterectomy    . Tonsillectomy    . Nasal sinus surgery    . Total knee arthroplasty  08/01/2012    Procedure: TOTAL KNEE ARTHROPLASTY;  Surgeon: Meredith Pel, MD;  Location: [facility];  Service: Orthopedics;  Laterality: Right;  Right total knee arthroplasty  . Total knee arthroplasty  08/01/2012    RIGHT  KNEE  . Ercp N/A 05/02/2013    Procedure: ENDOSCOPIC RETROGRADE CHOLANGIOPANCREATOGRAPHY (ERCP);  Surgeon: Beryle Beams, MD;  Location: [facility];  Service: Endoscopy;  Laterality: N/A;  . Sphincterotomy  05/02/2013    Procedure: SPHINCTEROTOMY;  Surgeon: Beryle Beams, MD;  Location: [facility];  Service: Endoscopy;;  . Cholecystectomy N/A 05/04/2013    Procedure: LAPAROSCOPIC CHOLECYSTECTOMY WITH INTRAOPERATIVE CHOLANGIOGRAM;  Surgeon: Haywood Lasso, MD;  Location: [facility];  Service: General;  Laterality: N/A;   Family History  Problem Relation Age of Onset  . Diabetes Other   . Heart disease Other   . Cancer Mother     breast  . Heart attack Mother   . Stroke Father    History  Substance Use Topics  . Smoking status: Former Smoker    Types: Cigarettes  . Smokeless tobacco: Never Used     Comment: quit smoking regularly in her 30's; now has an occasional cigarette  . Alcohol Use: Yes     Comment: occasional   OB History  Grav Para Term Preterm Abortions TAB SAB Ect Mult Living                 Review of Systems  Constitutional: Negative for fever.  Gastrointestinal: Positive for nausea, vomiting, abdominal pain and diarrhea. Negative for blood in stool.  Genitourinary: Negative for dysuria.  Musculoskeletal: Negative for back pain.  All other systems reviewed and are negative.      Allergies  Monopril and Norvasc  Home Medications   Current Outpatient Rx  Name  Route  Sig  Dispense  Refill  . amLODipine (NORVASC) 5 MG tablet   Oral   Take 1 tablet (5 mg total) by mouth daily.   90 tablet   3   .  aspirin EC 81 MG tablet   Oral   Take 81 mg by mouth daily.         Marland Kitchen atorvastatin (LIPITOR) 40 MG tablet      TAKE 1 TABLET DAILY   90 tablet   1   . Calcium Carbonate-Vitamin D (CALCIUM 600 + D PO)   Oral   Take 1 tablet by mouth 2 (two) times daily.         . cephALEXin (KEFLEX) 500 MG capsule   Oral   Take 1 capsule (500 mg total) by mouth 3 (three) times daily.   28 capsule   0   . cloNIDine (CATAPRES) 0.3 MG tablet   Oral   Take 1 tablet (0.3 mg total) by mouth 2 (two) times daily.   180 tablet   3   . diclofenac sodium (VOLTAREN) 1 % GEL   Topical   Apply 3 g topically 3 (three) times daily. To 3 large joints         . estradiol (ESTRACE) 0.5 MG tablet   Oral   Take 0.5 mg by mouth 2 (two) times daily.         . fish oil-omega-3 fatty acids 1000 MG capsule   Oral   Take 1 g by mouth at bedtime.          . folic acid (FOLVITE) 1 MG tablet   Oral   Take 1 tablet by mouth at bedtime.          Marland Kitchen losartan-hydrochlorothiazide (HYZAAR) 100-12.5 MG per tablet   Oral   Take 1 tablet by mouth daily.   90 tablet   3   . metFORMIN (GLUCOPHAGE) 1000 MG tablet      TAKE ONE TABLET BY MOUTH TWICE DAILY   180 tablet   1   . methotrexate (RHEUMATREX) 2.5 MG tablet   Oral   Take 8 tablets by mouth once a week.         . metoprolol tartrate (LOPRESSOR) 25 MG tablet   Oral   Take 12.5 mg by mouth 2 (two) times daily.         . Multiple Vitamins-Minerals (MULTIVITAMINS THER. W/MINERALS) TABS   Oral   Take 1 tablet by mouth daily.         Marland Kitchen omeprazole (PRILOSEC) 20 MG capsule   Oral   Take 1 capsule (20 mg total) by mouth daily.   90 capsule   3   . ondansetron (ZOFRAN) 4 MG tablet   Oral   Take 1 tablet (4 mg total) by mouth every 6 (six) hours.   12 tablet   0    BP 165/84  Pulse 97  Temp(Src) 98.8 F (37.1 C) (Oral)  Resp  18  SpO2 98% Physical Exam  Nursing note and vitals reviewed. Constitutional: She is oriented to person,  place, and time. She appears well-developed and well-nourished. No distress.  HENT:  Head: Normocephalic and atraumatic.  Right Ear: External ear normal.  Left Ear: External ear normal.  Nose: Nose normal.  Eyes: Right eye exhibits no discharge. Left eye exhibits no discharge.  Cardiovascular: Normal rate, regular rhythm and normal heart sounds.   Pulmonary/Chest: Effort normal and breath sounds normal.  Abdominal: Soft. There is tenderness in the right upper quadrant and epigastric area.  Neurological: She is alert and oriented to person, place, and time.  Skin: Skin is warm and dry.    ED Course  Procedures (including critical care time) Labs Review Labs Reviewed  CBC WITH DIFFERENTIAL - Abnormal; Notable for the following:    RDW 16.2 (*)    All other components within normal limits  COMPREHENSIVE METABOLIC PANEL - Abnormal; Notable for the following:    Potassium 3.2 (*)    CO2 18 (*)    Glucose, Bld 137 (*)    AST 43 (*)    ALT 39 (*)    Alkaline Phosphatase 155 (*)    GFR calc non Af Amer 84 (*)    All other components within normal limits  URINALYSIS, ROUTINE W REFLEX MICROSCOPIC - Abnormal; Notable for the following:    APPearance CLOUDY (*)    Bilirubin Urine SMALL (*)    Ketones, ur >80 (*)    Protein, ur 100 (*)    All other components within normal limits  URINE MICROSCOPIC-ADD ON - Abnormal; Notable for the following:    Casts GRANULAR CAST (*)    All other components within normal limits  CLOSTRIDIUM DIFFICILE BY PCR  STOOL CULTURE  OVA AND PARASITE EXAMINATION  LIPASE, BLOOD  GI PATHOGEN PANEL BY PCR, STOOL  COMPREHENSIVE METABOLIC PANEL  HEPATITIS PANEL, ACUTE   Imaging Review Dg Chest 2 View  10/15/2013   CLINICAL DATA:  Nausea, vomiting and diarrhea.  EXAM: CHEST  2 VIEW  COMPARISON:  Chest radiograph performed 11/09/2012  FINDINGS: The lungs are well-aerated. There is persistent elevation of the right hemidiaphragm. There is no evidence of focal  opacification, pleural effusion or pneumothorax.  The heart is normal in size; the mediastinal contour is within normal limits. No acute osseous abnormalities are seen. Clips are noted within the right upper quadrant, reflecting prior cholecystectomy. Scattered calcification is seen along the abdominal aorta.  IMPRESSION: Persistent elevation of the right hemidiaphragm. Lungs remain grossly clear.   Electronically Signed   By: Garald Balding M.D.   On: 10/15/2013 01:51   Ct Abdomen Pelvis W Contrast  10/16/2013   CLINICAL DATA:  Epigastric pain with nausea and vomiting.  EXAM: CT ABDOMEN AND PELVIS WITH CONTRAST  TECHNIQUE: Multidetector CT imaging of the abdomen and pelvis was performed using the standard protocol following bolus administration of intravenous contrast.  CONTRAST:  52mL OMNIPAQUE IOHEXOL 300 MG/ML SOLN, 152mL OMNIPAQUE IOHEXOL 300 MG/ML SOLN  COMPARISON:  DG CHOLANGIOGRAM OPERATIVE dated 05/04/2013; MR MRCP dated 02/24/2009  FINDINGS: Lung Bases: Dependent atelectasis.  Liver:  Probable fatty liver.  Tiny cyst in the left hepatic dome.  Spleen:  Normal.  Gallbladder:  Surgically absent.  Clips in the fossa.  Common bile duct: Postcholecystectomy dilation of the common bile duct. Mild intrahepatic biliary ductal dilation commonly seen after cholecystectomy.  Pancreas:  Normal.  Adrenal glands:  Normal bilaterally.  Kidneys: Bilateral renal cysts, some  of which show enlargement compared to prior MRI. Normal renal enhancement. Normal delayed excretion of contrast. Both ureters appear within normal limits.  Stomach: Patulous gastroesophageal junction and small hiatal hernia.  Small bowel:  Normal.  Colon: Appendix not identified. No right lower quadrant inflammatory changes. Nonspecific fluid levels within the colon. No mural thickening or pericolonic inflammatory changes. Rectum appears normal.  Pelvic Genitourinary: Hysterectomy. Normal urinary bladder. No free fluid in the pelvis.  Bones: No  aggressive osseous lesions. Severe lumbar spondylosis and scoliosis.  Vasculature: Atherosclerosis without an acute vascular abnormality.  Body Wall: Fat containing periumbilical hernia.  IMPRESSION: 1. Nonspecific fluid levels within the colon. These are most commonly associated with enteric infection. 2. Cholecystectomy and hysterectomy.   Electronically Signed   By: Dereck Ligas M.D.   On: 10/16/2013 20:42    EKG Interpretation   None       MDM   Final diagnoses:  Vomiting  Dehydration  Hypokalemia    Patient appears dehydrated here and is unable to keep down significant fluids. She was given multiple rounds of Zofran but still had intermittent vomiting and unable to take by mouth. Due to this the patient will need admission. There is no evidence of pancreatitis. The CT scan shows some enteritis but no obstruction or abscess. Discussed with the hospitalist Dr. Doyle Askew, who will omit the patient. She will replace the potassium IV and we'll start Cipro Flagyl for possible intra-abdominal infection.    Ephraim Hamburger, MD 10/16/13 [phone]

## 2013-10-16 NOTE — ED Notes (Signed)
IV team and lab paged. IV sticks unsuccessful.

## 2013-10-16 NOTE — ED Notes (Signed)
Hold lab draw at this time per RN. 

## 2013-10-16 NOTE — H&P (Signed)
[facility] History and Physical  Lesle Faron CVE:[id number] DOB: 12-28-1945 DOA: 10/16/2013  Referring physician: ED physician PCP: Karis Juba, PA-C   Chief Complaint: vomiting   HPI:  68 year old female with HTN, HLD, DM, RA, presents to [facility] ED with main concern of progressively worsening non bloody vomiting and non bloody diarrhea, that started 4 days PTA, associated with poor oral intake, unable to keep any fluids or solids. This has need also associated with generalized abdominal discomfort, with cramps, 5/10 in severity with no specific radiating symptoms. Pt denies any specific alleviating factors, no similar events in the past. No known sick contacts or exposures. She was treated for uTI several days PTA with Keflex but was not able to keep anything down. PCP saw her earlier today and sent her to the ED. Pt denies chest pain, no shortness of breath, no urinary concerns.   In ED, pt is hemodynamically stable, CT abd/pelvis worrisome for enteritis. [facility] asked to admit for further evaluation.   Assessment and Plan:  Active Problems: N/V/D - unclear etiology, ? Enteritis - pt with low grade fever on admission 99.3 F - admit to medical floor for further evaluation  - order stool studies, O&P, GI stool panel, C. Diff by PCR - start antiemetics and analgesia as needed - place on empiric ABX for now Cipro and Flagyl  - allow clear liquids if pt able to tolerate - Place on Protonix IV  Hypokalemia - secondary to diarrhea and vomiting - will check Mg and will supplement with KCl x 6 runs IV - repeat BMP in AM Transaminitis - possibly secondary to principal problem - check CMET again in AM  HTN - continue home medical regimen Diabetes - hold metformin - check A1C and place on SSI once pt able to tolerate PO  - hold all insulin products as pt not eating at this time Rheumatoid arthritis - hold Methotrexate (given once per week) GERD - place on Protonix IV   Code  Status: Full Family Communication: Pt at bedside Disposition Plan: Admit to medical floor   Review of Systems:  Constitutional: Negative for diaphoresis.  HENT: Negative for hearing loss, sore throat, neck pain, tinnitus and ear discharge.   Eyes: Negative for blurred vision, double vision, photophobia, pain, discharge and redness.  Respiratory: Negative for cough, hemoptysis, sputum production, shortness of breath, wheezing and stridor.   Cardiovascular: Negative for chest pain, palpitations, orthopnea, claudication and leg swelling.  Gastrointestinal: Negative for heartburn, constipation, blood in stool and melena.  Genitourinary: Negative for dysuria, urgency, frequency, hematuria and flank pain.  Musculoskeletal: Negative for myalgias, back pain, joint pain and falls.  Skin: Negative for itching and rash.  Neurological: Negative for tingling, tremors, sensory change, speech change, focal weakness. Endo/Heme/Allergies: Negative for environmental allergies and polydipsia. Does not bruise/bleed easily.  Psychiatric/Behavioral: Negative for suicidal ideas. The patient is not nervous/anxious.      Past Medical History  Diagnosis Date  . Hypertension   . Normal cardiac stress test     pt can't remember when or where  . Diabetes mellitus without complication     Type 2 NIDDM x 15 years  . Arthritis     osteoarthritis  . Hyperlipidemia   . Hormone replacement therapy (postmenopausal)   . Fatty liver disease, nonalcoholic 0258    hospitalized, MRCP dx fatty liver  . Rheumatoid arthritis(714.0) 09/28/2012  . Obesity, unspecified 12/27/2012  . GERD (gastroesophageal reflux disease)   . Chronic cholecystitis with calculus 05/04/2013  .  Unspecified essential hypertension 05/04/2013  . Diabetes type 2, controlled 05/04/2013  . Bursitis of right shoulder     Past Surgical History  Procedure Laterality Date  . Joint replacement      left knee  . Abdominal hysterectomy    . Tonsillectomy     . Nasal sinus surgery    . Total knee arthroplasty  08/01/2012    Procedure: TOTAL KNEE ARTHROPLASTY;  Surgeon: Meredith Pel, MD;  Location: [place];  Service: Orthopedics;  Laterality: Right;  Right total knee arthroplasty  . Total knee arthroplasty  08/01/2012    RIGHT  KNEE  . Ercp N/A 05/02/2013    Procedure: ENDOSCOPIC RETROGRADE CHOLANGIOPANCREATOGRAPHY (ERCP);  Surgeon: Beryle Beams, MD;  Location: [facility];  Service: Endoscopy;  Laterality: N/A;  . Sphincterotomy  05/02/2013    Procedure: SPHINCTEROTOMY;  Surgeon: Beryle Beams, MD;  Location: [facility];  Service: Endoscopy;;  . Cholecystectomy N/A 05/04/2013    Procedure: LAPAROSCOPIC CHOLECYSTECTOMY WITH INTRAOPERATIVE CHOLANGIOGRAM;  Surgeon: Haywood Lasso, MD;  Location: [facility];  Service: General;  Laterality: N/A;    Social History:  reports that she has quit smoking. Her smoking use included Cigarettes. She smoked 0.00 packs per day. She has never used smokeless tobacco. She reports that she drinks alcohol. She reports that she does not use illicit drugs.  Allergies  Allergen Reactions  . Monopril [Fosinopril] Cough  . Norvasc [Amlodipine Besylate]     10 mg dose causes LE edema.  Tolerates 5mg  dose.    Family History  Problem Relation Age of Onset  . Diabetes Other   . Heart disease Other   . Cancer Mother     breast  . Heart attack Mother   . Stroke Father     Prior to Admission medications   Medication Sig Start Date End Date Taking? Authorizing Provider  amLODipine (NORVASC) 5 MG tablet Take 1 tablet (5 mg total) by mouth daily. 01/16/13  Yes Orlena Sheldon, PA-C  aspirin EC 81 MG tablet Take 81 mg by mouth daily.   Yes Historical Provider, MD  atorvastatin (LIPITOR) 40 MG tablet TAKE 1 TABLET DAILY 05/22/13  Yes Orlena Sheldon, PA-C  Calcium Carbonate-Vitamin D (CALCIUM 600 + D PO) Take 1 tablet by mouth 2 (two) times daily.   Yes Historical Provider, MD  cephALEXin (KEFLEX) 500 MG capsule Take 1  capsule (500 mg total) by mouth 3 (three) times daily. 10/15/13  Yes Elyn Peers, MD  cloNIDine (CATAPRES) 0.3 MG tablet Take 1 tablet (0.3 mg total) by mouth 2 (two) times daily. 03/29/13  Yes Orlena Sheldon, PA-C  diclofenac sodium (VOLTAREN) 1 % GEL Apply 3 g topically 3 (three) times daily. To 3 large joints   Yes Historical Provider, MD  estradiol (ESTRACE) 0.5 MG tablet Take 0.5 mg by mouth 2 (two) times daily.   Yes Historical Provider, MD  fish oil-omega-3 fatty acids 1000 MG capsule Take 1 g by mouth at bedtime.    Yes Historical Provider, MD  folic acid (FOLVITE) 1 MG tablet Take 1 tablet by mouth at bedtime.  12/08/12  Yes Historical Provider, MD  losartan-hydrochlorothiazide (HYZAAR) 100-12.5 MG per tablet Take 1 tablet by mouth daily. 01/16/13  Yes Mary B Dixon, PA-C  metFORMIN (GLUCOPHAGE) 1000 MG tablet TAKE ONE TABLET BY MOUTH TWICE DAILY 07/23/13  Yes Orlena Sheldon, PA-C  methotrexate (RHEUMATREX) 2.5 MG tablet Take 8 tablets by mouth once a week. 12/08/12  Yes Historical Provider,  MD  metoprolol tartrate (LOPRESSOR) 25 MG tablet Take 12.5 mg by mouth 2 (two) times daily.   Yes Historical Provider, MD  Multiple Vitamins-Minerals (MULTIVITAMINS THER. W/MINERALS) TABS Take 1 tablet by mouth daily.   Yes Historical Provider, MD  omeprazole (PRILOSEC) 20 MG capsule Take 1 capsule (20 mg total) by mouth daily. 03/29/13  Yes Mary B Dixon, PA-C  ondansetron (ZOFRAN) 4 MG tablet Take 1 tablet (4 mg total) by mouth every 6 (six) hours. 10/15/13  Yes Elyn Peers, MD    Physical Exam: Filed Vitals:   10/16/13 1729 10/16/13 2041 10/16/13 2042  BP: 165/84 170/69   Pulse: 97 82   Temp: 98.8 F (37.1 C) 99.3 F (37.4 C) 99.3 F (37.4 C)  TempSrc: Oral Oral Oral  Resp: 18 16   SpO2: 98% 96%     Physical Exam  Constitutional: Appears well-developed and well-nourished. No distress.  HENT: Normocephalic. External right and left ear normal. Dry MM  Eyes: Conjunctivae and EOM are normal. PERRLA, no  scleral icterus.  Neck: Normal ROM. Neck supple. No JVD. No tracheal deviation. No thyromegaly.  CVS: RRR, S1/S2 +, no murmurs, no gallops, no carotid bruit.  Pulmonary: Effort and breath sounds normal, no stridor, rhonchi, wheezes, rales.  Abdominal: Soft. BS +,  no distension, tenderness in epigastric area, no ebound or guarding.  Musculoskeletal: Normal range of motion. No edema and no tenderness.  Lymphadenopathy: No lymphadenopathy noted, cervical, inguinal. Neuro: Alert. Normal reflexes, muscle tone coordination. No cranial nerve deficit. Skin: Skin is warm and dry. No rash noted. Not diaphoretic. No erythema. No pallor.  Psychiatric: Normal mood and affect. Behavior, judgment, thought content normal.   Labs on Admission:  Basic Metabolic Panel:  Recent Labs Lab 10/15/13 0039 10/16/13 1834  NA 142 145  K 3.0* 3.2*  CL 103 100  CO2 20 18*  GLUCOSE 95 137*  BUN 17 14  CREATININE 0.74 0.76  CALCIUM 8.2* 8.9   Liver Function Tests:  Recent Labs Lab 10/15/13 0039 10/16/13 1834  AST 63* 43*  ALT 45* 39*  ALKPHOS 164* 155*  BILITOT 0.6 0.5  PROT 6.5 7.9  ALBUMIN 3.0* 3.8    Recent Labs Lab 10/15/13 0039 10/16/13 1834  LIPASE 19 12   CBC:  Recent Labs Lab 10/15/13 0039 10/16/13 1834  WBC 7.7 5.1  NEUTROABS 6.5 3.3  HGB 11.6* 12.1  HCT 35.4* 39.0  MCV 96.7 98.0  PLT 154 235   Cardiac Enzymes:  Recent Labs Lab 10/15/13 0454  TROPONINI <0.30   Radiological Exams on Admission: Dg Chest 2 View   10/15/2013  Persistent elevation of the right hemidiaphragm. Lungs remain grossly clear.   Ct Abdomen Pelvis W Contrast  10/16/2013   Nonspecific fluid levels within the colon. These are most commonly associated with enteric infection.  Cholecystectomy and hysterectomy.    EKG: Normal sinus rhythm, no ST/T wave changes  Faye Ramsay, MD  [facility] Pager [phone]  If 7PM-7AM, please contact night-coverage [email or web address] Password  [facility] 10/16/2013, 9:42 PM

## 2013-10-16 NOTE — ED Notes (Addendum)
Unsuccessful lab stick. Unable to obtain at this time.

## 2013-10-16 NOTE — ED Notes (Signed)
Pt being sent by [facility].  Facility sts Pt is dehydrated and would like [place] to rule out Pancreatitis.

## 2013-10-16 NOTE — Progress Notes (Signed)
Subjective:    Patient ID: [name], female    DOB: 1945-10-15, 68 y.o.   MRN: [id number]  HPI Patient is seen urgently today as a followup from her hospital visit on the 22nd.  Patient was given 3 bags of IV fluids for dehydration and was given Zofran. She's been unable to keep down Zofran for more than 5 minutes. She is unable to keep down any water. She is vomiting constantly. She is also having occasional diarrhea. She is still passing gas.  She reports low-grade fevers of 99.3. She is extremely tender to palpation in the epigastric area of her pancreas. She reports abdominal pain whenever she eats. This is been ongoing for the last week. However she reports progressive nausea for the last month. Wt Readings from Last 3 Encounters:  10/16/13 148 lb (67.132 kg)  10/15/13 150 lb (68.04 kg)  07/04/13 166 lb (75.297 kg)   she is also an additional 2 pounds and she was seen in the hospital even after receiving 3 bags of IV fluids. She is lost 18 pounds since her office visit in November.   Past Medical History  Diagnosis Date  . Hypertension   . Normal cardiac stress test     pt can't remember when or where  . Diabetes mellitus without complication     Type 2 NIDDM x 15 years  . Arthritis     osteoarthritis  . Hyperlipidemia   . Hormone replacement therapy (postmenopausal)   . Fatty liver disease, nonalcoholic 4098    hospitalized, MRCP dx fatty liver  . Rheumatoid arthritis(714.0) 09/28/2012  . Obesity, unspecified 12/27/2012  . GERD (gastroesophageal reflux disease)   . Chronic cholecystitis with calculus 05/04/2013  . Unspecified essential hypertension 05/04/2013  . Diabetes type 2, controlled 05/04/2013  . Bursitis of right shoulder    Current Outpatient Prescriptions on File Prior to Visit  Medication Sig Dispense Refill  . amLODipine (NORVASC) 5 MG tablet Take 1 tablet (5 mg total) by mouth daily.  90 tablet  3  . aspirin EC 81 MG tablet Take 81 mg by mouth daily.      Marland Kitchen  atorvastatin (LIPITOR) 40 MG tablet TAKE 1 TABLET DAILY  90 tablet  1  . Calcium Carbonate-Vitamin D (CALCIUM 600 + D PO) Take 1 tablet by mouth 2 (two) times daily.      . cloNIDine (CATAPRES) 0.3 MG tablet Take 1 tablet (0.3 mg total) by mouth 2 (two) times daily.  180 tablet  3  . diclofenac sodium (VOLTAREN) 1 % GEL Apply 3 g topically 3 (three) times daily. To 3 large joints      . estradiol (ESTRACE) 0.5 MG tablet Take 0.5 mg by mouth 2 (two) times daily.      . fish oil-omega-3 fatty acids 1000 MG capsule Take 1 g by mouth at bedtime.       . folic acid (FOLVITE) 1 MG tablet Take 1 tablet by mouth at bedtime.       Marland Kitchen losartan-hydrochlorothiazide (HYZAAR) 100-12.5 MG per tablet Take 1 tablet by mouth daily.  90 tablet  3  . metFORMIN (GLUCOPHAGE) 1000 MG tablet TAKE ONE TABLET BY MOUTH TWICE DAILY  180 tablet  1  . methotrexate (RHEUMATREX) 2.5 MG tablet Take 8 tablets by mouth once a week.      . metoprolol tartrate (LOPRESSOR) 25 MG tablet Take 12.5 mg by mouth 2 (two) times daily.      . Multiple Vitamins-Minerals (MULTIVITAMINS THER. W/MINERALS)  TABS Take 1 tablet by mouth daily.      Marland Kitchen omeprazole (PRILOSEC) 20 MG capsule Take 1 capsule (20 mg total) by mouth daily.  90 capsule  3  . ondansetron (ZOFRAN) 4 MG tablet Take 1 tablet (4 mg total) by mouth every 6 (six) hours.  12 tablet  0  . cephALEXin (KEFLEX) 500 MG capsule Take 1 capsule (500 mg total) by mouth 3 (three) times daily.  28 capsule  0   No current facility-administered medications on file prior to visit.   Allergies  Allergen Reactions  . Monopril [Fosinopril] Cough  . Norvasc [Amlodipine Besylate]     10 mg dose causes LE edema.  Tolerates 5mg  dose.   History   Social History  . Marital Status: Divorced    Spouse Name: N/A    Number of Children: N/A  . Years of Education: N/A   Occupational History  . Not on file.   Social History Main Topics  . Smoking status: Former Smoker    Types: Cigarettes  .  Smokeless tobacco: Never Used     Comment: quit smoking regularly in her 30's; now has an occasional cigarette  . Alcohol Use: Yes     Comment: occasional  . Drug Use: No  . Sexual Activity: No   Other Topics Concern  . Not on file   Social History Narrative  . No narrative on file       Review of Systems  All other systems reviewed and are negative.       Objective:   Physical Exam  Vitals reviewed. Constitutional: She appears distressed.  HENT:  Mouth/Throat: Mucous membranes are dry.  Eyes: Conjunctivae are normal.  Neck: Neck supple.  Cardiovascular: Normal rate, regular rhythm and normal heart sounds.   No murmur heard. Pulmonary/Chest: Effort normal and breath sounds normal. No respiratory distress. She has no wheezes. She has no rales.  Abdominal: Soft. She exhibits no distension. There is tenderness. There is guarding.  Lymphadenopathy:    She has no cervical adenopathy.  Skin: She is not diaphoretic.   Patient is tender to palpation over the epigastric area and over the pancreas. She has mild guarding.       Assessment & Plan:  1. Pancreatitis, acute I am concerned the patient has pancreatitis. I feel she needs a CBC CMP and lipase. Also feels she needs a CT scan of the abdomen and pelvis to evaluate for possible causes of this. She had a cholecystectomy in September. She does not drink any alcohol. I am also concerned about possible bowel obstruction although the most likely explanation is pancreatitis at this time. Unfortunately the patient is dehydrated, so I do not feel I can do this workup as an outpatient. She's been unable to tolerate any oral fluids. She's been unable to tolerate oral Zofran. I gave the patient the option of going home using Phenergan suppositories but she feels too weak and she would like to be evaluated in the emergency room. - COMPLETE METABOLIC PANEL WITH GFR - CBC with Differential - Lipase - promethazine (PHENERGAN) 25 MG  suppository; Place 1 suppository (25 mg total) rectally every 6 (six) hours as needed for nausea or vomiting.  Dispense: 12 each; Refill: 0

## 2013-10-17 DIAGNOSIS — E860 Dehydration: Principal | ICD-10-CM

## 2013-10-17 DIAGNOSIS — I10 Essential (primary) hypertension: Secondary | ICD-10-CM

## 2013-10-17 LAB — CBC
HCT: 36.7 % (ref 36.0–46.0)
Hemoglobin: 12.2 g/dL (ref 12.0–15.0)
MCH: 31.9 pg (ref 26.0–34.0)
MCHC: 33.2 g/dL (ref 30.0–36.0)
MCV: 96.1 fL (ref 78.0–100.0)
Platelets: 215 10*3/uL (ref 150–400)
RBC: 3.82 MIL/uL — ABNORMAL LOW (ref 3.87–5.11)
RDW: 16.4 % — ABNORMAL HIGH (ref 11.5–15.5)
WBC: 7.4 10*3/uL (ref 4.0–10.5)

## 2013-10-17 LAB — COMPREHENSIVE METABOLIC PANEL
ALBUMIN: 3.2 g/dL — AB (ref 3.5–5.2)
ALK PHOS: 124 U/L — AB (ref 39–117)
ALT: 31 U/L (ref 0–35)
AST: 33 U/L (ref 0–37)
BILIRUBIN TOTAL: 0.4 mg/dL (ref 0.3–1.2)
BUN: 8 mg/dL (ref 6–23)
CHLORIDE: 103 meq/L (ref 96–112)
CO2: 18 mEq/L — ABNORMAL LOW (ref 19–32)
Calcium: 8.2 mg/dL — ABNORMAL LOW (ref 8.4–10.5)
Creatinine, Ser: 0.6 mg/dL (ref 0.50–1.10)
GFR calc Af Amer: >90 mL/min (ref >90)
GFR calc non Af Amer: >90 mL/min (ref >90)
Glucose, Bld: 95 mg/dL (ref 70–99)
POTASSIUM: 3.3 meq/L — AB (ref 3.7–5.3)
SODIUM: 140 meq/L (ref 137–147)
TOTAL PROTEIN: 6.5 g/dL (ref 6.0–8.3)

## 2013-10-17 LAB — HEPATITIS PANEL, ACUTE
HCV Ab: NEGATIVE
Hep A IgM: NONREACTIVE
Hep B C IgM: NONREACTIVE
Hepatitis B Surface Ag: NEGATIVE

## 2013-10-17 LAB — HEMOGLOBIN A1C
HEMOGLOBIN A1C: 6.4 % — AB (ref <5.7)
MEAN PLASMA GLUCOSE: 137 mg/dL — AB (ref <117)

## 2013-10-17 LAB — MAGNESIUM: Magnesium: 0.7 mg/dL — CL (ref 1.5–2.5)

## 2013-10-17 LAB — PHOSPHORUS: Phosphorus: 2 mg/dL — ABNORMAL LOW (ref 2.3–4.6)

## 2013-10-17 MED ORDER — DEXTROSE 5 % IV SOLN
3.0000 g | Freq: Once | INTRAVENOUS | Status: AC
  Administered 2013-10-17: 3 g via INTRAVENOUS
  Filled 2013-10-17: qty 6

## 2013-10-17 MED ORDER — POTASSIUM CHLORIDE 10 MEQ/100ML IV SOLN
10.0000 meq | INTRAVENOUS | Status: AC
  Administered 2013-10-17 – 2013-10-18 (×4): 10 meq via INTRAVENOUS
  Filled 2013-10-17 (×4): qty 100

## 2013-10-17 MED ORDER — ACETAMINOPHEN 325 MG PO TABS
650.0000 mg | ORAL_TABLET | Freq: Once | ORAL | Status: AC
  Administered 2013-10-17: 650 mg via ORAL
  Filled 2013-10-17: qty 2

## 2013-10-17 MED ORDER — GUAIFENESIN ER 600 MG PO TB12
600.0000 mg | ORAL_TABLET | Freq: Once | ORAL | Status: DC
  Filled 2013-10-17: qty 1

## 2013-10-17 MED ORDER — BACLOFEN 5 MG HALF TABLET
5.0000 mg | ORAL_TABLET | Freq: Once | ORAL | Status: AC
  Administered 2013-10-17: 5 mg via ORAL
  Filled 2013-10-17: qty 1

## 2013-10-17 MED ORDER — POTASSIUM CHLORIDE CRYS ER 20 MEQ PO TBCR
40.0000 meq | EXTENDED_RELEASE_TABLET | ORAL | Status: DC
  Filled 2013-10-17 (×2): qty 2

## 2013-10-17 MED ORDER — BOOST / RESOURCE BREEZE PO LIQD
1.0000 | ORAL | Status: DC
  Administered 2013-10-19: 1 via ORAL

## 2013-10-17 NOTE — Progress Notes (Signed)
CRITICAL VALUE ALERT  Critical value received: Magnesium 0.7  Date of notification:  10/17/13  Time of notification:  0113  Critical value read back:Yes  Nurse who received alert: Azzie Glatter, RN  MD notified (1st page): Baltazar Najjar  Time of first page: 0115  MD notified (2nd page): n/a  Time of second page: n/a  Responding MD:  Baltazar Najjar  Time MD responded:  0120am (orders received, magnesium hung.)

## 2013-10-17 NOTE — Progress Notes (Signed)
[facility] PROGRESS NOTE  [name] GUY:[id number] DOB: 10/22/1945 DOA: 10/16/2013 PCP: Dena Billet BETH, PA-C  Assessment/Plan: 1. Enteritis-Improving, continue with Cipro, Flagyl. Tolerating clear liquid diet. Continue Dilaudid prn for pain. Continue with zofran prn for nausea/vomiting. 2. Hypokalemia- Sec to diarrhea, replace potassium. 3. Mild transaminitis- resolved, likely due to above 4. DM- Continue with SSI 5. Hypertension- BP stable, continue with Norvasc, HCTZ, Catapres  Code Status: Full code  Family Communication: *No family at bedside Disposition Plan: Home when stable   Consultants:  None  Procedures:  None  Antibiotics:  None  HPI/Subjective: Patient seen and examined, admitted with vomiting and diarrhea. Feels better today. Objective: Filed Vitals:   10/17/13 1300  BP: 157/85  Pulse:   Temp: 98.6 F (37 C)  Resp: 16    Intake/Output Summary (Last 24 hours) at 10/17/13 1543 Last data filed at 10/17/13 1300  Gross per 24 hour  Intake 1156.67 ml  Output   2700 ml  Net -1543.33 ml   Filed Weights   10/16/13 2351  Weight: 66.7 kg (147 lb 0.8 oz)    Exam:        Physical Exam: Head: Normocephalic, atraumatic.  Eyes: No signs of jaundice, EOMI Nose: Mucous membranes dry.  Throat: Oropharynx nonerythematous, no exudate appreciated.  Neck: supple,No deformities, masses, or tenderness noted. Lungs: Normal respiratory effort. B/L Clear to auscultation, no crackles or wheezes.  Heart: Regular RR. S1 and S2 normal  Abdomen: BS normoactive. Soft, Nondistended, non-tender.  Extremities: No pretibial edema, no erythema   Data Reviewed: Basic Metabolic Panel:  Recent Labs Lab 10/15/13 0039 10/16/13 1834 10/17/13 0015 10/17/13 0831  NA 142 145  --  140  K 3.0* 3.2*  --  3.3*  CL 103 100  --  103  CO2 20 18*  --  18*  GLUCOSE 95 137*  --  95  BUN 17 14  --  8  CREATININE 0.74 0.76  --  0.60  CALCIUM 8.2* 8.9  --  8.2*  MG  --    --  0.7*  --   PHOS  --   --  2.0*  --    Liver Function Tests:  Recent Labs Lab 10/15/13 0039 10/16/13 1834 10/17/13 0831  AST 63* 43* 33  ALT 45* 39* 31  ALKPHOS 164* 155* 124*  BILITOT 0.6 0.5 0.4  PROT 6.5 7.9 6.5  ALBUMIN 3.0* 3.8 3.2*    Recent Labs Lab 10/15/13 0039 10/16/13 1834  LIPASE 19 12   No results found for this basename: AMMONIA,  in the last 168 hours CBC:  Recent Labs Lab 10/15/13 0039 10/16/13 1834 10/17/13 0831  WBC 7.7 5.1 7.4  NEUTROABS 6.5 3.3  --   HGB 11.6* 12.1 12.2  HCT 35.4* 39.0 36.7  MCV 96.7 98.0 96.1  PLT 154 235 215   Cardiac Enzymes:  Recent Labs Lab 10/15/13 0454  TROPONINI <0.30   BNP (last 3 results) No results found for this basename: PROBNP,  in the last 8760 hours CBG: No results found for this basename: GLUCAP,  in the last 168 hours  No results found for this or any previous visit (from the past 240 hour(s)).   Studies: Ct Abdomen Pelvis W Contrast  10/16/2013   CLINICAL DATA:  Epigastric pain with nausea and vomiting.  EXAM: CT ABDOMEN AND PELVIS WITH CONTRAST  TECHNIQUE: Multidetector CT imaging of the abdomen and pelvis was performed using the standard protocol following bolus administration of intravenous contrast.  CONTRAST:  32mL OMNIPAQUE IOHEXOL 300 MG/ML SOLN, 177mL OMNIPAQUE IOHEXOL 300 MG/ML SOLN  COMPARISON:  DG CHOLANGIOGRAM OPERATIVE dated 05/04/2013; MR MRCP dated 02/24/2009  FINDINGS: Lung Bases: Dependent atelectasis.  Liver:  Probable fatty liver.  Tiny cyst in the left hepatic dome.  Spleen:  Normal.  Gallbladder:  Surgically absent.  Clips in the fossa.  Common bile duct: Postcholecystectomy dilation of the common bile duct. Mild intrahepatic biliary ductal dilation commonly seen after cholecystectomy.  Pancreas:  Normal.  Adrenal glands:  Normal bilaterally.  Kidneys: Bilateral renal cysts, some of which show enlargement compared to prior MRI. Normal renal enhancement. Normal delayed excretion of  contrast. Both ureters appear within normal limits.  Stomach: Patulous gastroesophageal junction and small hiatal hernia.  Small bowel:  Normal.  Colon: Appendix not identified. No right lower quadrant inflammatory changes. Nonspecific fluid levels within the colon. No mural thickening or pericolonic inflammatory changes. Rectum appears normal.  Pelvic Genitourinary: Hysterectomy. Normal urinary bladder. No free fluid in the pelvis.  Bones: No aggressive osseous lesions. Severe lumbar spondylosis and scoliosis.  Vasculature: Atherosclerosis without an acute vascular abnormality.  Body Wall: Fat containing periumbilical hernia.  IMPRESSION: 1. Nonspecific fluid levels within the colon. These are most commonly associated with enteric infection. 2. Cholecystectomy and hysterectomy.   Electronically Signed   By: Dereck Ligas M.D.   On: 10/16/2013 20:42    Scheduled Meds: . amLODipine  5 mg Oral Daily  . aspirin EC  81 mg Oral Daily  . ciprofloxacin  400 mg Intravenous Q12H  . cloNIDine  0.3 mg Oral BID  . diclofenac sodium  1 application Topical 3 times per day  . enoxaparin (LOVENOX) injection  40 mg Subcutaneous Q24H  . estradiol  0.5 mg Oral BID  . feeding supplement (RESOURCE BREEZE)  1 Container Oral Q24H  . folic acid  1 mg Oral QHS  . hydrochlorothiazide  25 mg Oral Daily  . losartan  100 mg Oral Daily  . metoprolol tartrate  12.5 mg Oral BID  . metronidazole  500 mg Intravenous Q8H  . pantoprazole (PROTONIX) IV  40 mg Intravenous Q24H  . potassium chloride  40 mEq Oral Q4H   Continuous Infusions: . sodium chloride 50 mL/hr at 10/17/13 0000    Active Problems:   Gastroenteritis    Time spent: 25 min    [facility] Pager [phone]. If 7PM-7AM, please contact night-coverage at [email or web address], password [facility] 10/17/2013, 3:43 PM  LOS: 1 day

## 2013-10-17 NOTE — Progress Notes (Signed)
INITIAL NUTRITION ASSESSMENT  DOCUMENTATION CODES Per approved criteria  -Non-severe (moderate) malnutrition in the context of chronic illness  Pt meets criteria for moderate MALNUTRITION in the context of chronic illness as evidenced by PO intake <75% for one month, 12% weight loss in 3 months.   INTERVENTION: -Resource Breeze po Q24H, each supplement provides 250 kcal and 9 grams of protein -Monitor refeeding labs-pt with low phos/k/mag and one month sub-optimal intake -Diet advancement per MD -Will continue to monitor  NUTRITION DIAGNOSIS: Inadequate oral intake  related to n/v as evidenced by PO intake <75%, 20 lbs wt loss in 2-3 months .   Goal: Pt to meet >/= 90% of their estimated nutrition needs    Monitor:  Total protein/energy intake, Labs, weights, GI profile, diet order  Reason for Assessment: MST  68 y.o. female  Admitting Dx: <principal problem not specified>  ASSESSMENT: 68 year old female with HTN, HLD, DM, RA, presents to [facility] ED with main concern of progressively worsening non bloody vomiting and non bloody diarrhea, that started 4 days PTA, associated with poor oral intake, unable to keep any fluids or solids. This has need also associated with generalized abdominal discomfort, with cramps, 5/10 in severity with no specific radiating symptoms. Pt denies any specific alleviating factors, no similar events in the past. No known sick contacts or exposures. She was treated for uTI several days PTA with Keflex but was not able to keep anything down  -Pt reported overall poor PO intake for 30 days. Had no appetite for any foods, all of which seemed to contribute to nausea. Would eat small amounts of 2-3 meals/daily -Significant decline in PO intake began on Friday 2/20. Has been unable to tolerate any liquids, even water, without n/v -Experienced a 20 lbs weight loss over past 2-3 months -Nausea is improving with medications. Had some vomiting earlier per RN. -Was  willing to order lunch of juice and jello. Was also interested in trying Resource Breeze to assist with meeting nutrition needs while on CL diet -No physical signs of muscle wasting or fat loss  -Pt with low Mag/Phos/K. Monitor for refeeding  Height: Ht Readings from Last 1 Encounters:  10/16/13 5' (1.524 m)    Weight: Wt Readings from Last 1 Encounters:  10/16/13 147 lb 0.8 oz (66.7 kg)    Ideal Body Weight: 100 lbs  % Ideal Body Weight: 147%  Wt Readings from Last 10 Encounters:  10/16/13 147 lb 0.8 oz (66.7 kg)  10/16/13 148 lb (67.132 kg)  10/15/13 150 lb (68.04 kg)  07/04/13 166 lb (75.297 kg)  05/30/13 169 lb 6.4 oz (76.839 kg)  05/09/13 161 lb (73.029 kg)  05/01/13 170 lb (77.111 kg)  05/01/13 170 lb (77.111 kg)  05/01/13 170 lb (77.111 kg)  05/01/13 170 lb (77.111 kg)    Usual Body Weight: 167 lbs  % Usual Body Weight: 88%  BMI:  Body mass index is 28.72 kg/(m^2). Overweight  Estimated Nutritional Needs: Kcal: 1400-1600 Protein: 60-70 gram Fluid: >/=1600 ml/daily  Skin: WDL  Diet Order: Clear Liquid  EDUCATION NEEDS: -No education needs identified at this time   Intake/Output Summary (Last 24 hours) at 10/17/13 1151 Last data filed at 10/17/13 0654  Gross per 24 hour  Intake 1156.67 ml  Output   1400 ml  Net -243.33 ml    Last BM: 2/24  Labs:   Recent Labs Lab 10/15/13 0039 10/16/13 1834 10/17/13 0015 10/17/13 0831  NA 142 145  --  140  K  3.0* 3.2*  --  3.3*  CL 103 100  --  103  CO2 20 18*  --  18*  BUN 17 14  --  8  CREATININE 0.74 0.76  --  0.60  CALCIUM 8.2* 8.9  --  8.2*  MG  --   --  0.7*  --   PHOS  --   --  2.0*  --   GLUCOSE 95 137*  --  95    CBG (last 3)  No results found for this basename: GLUCAP,  in the last 72 hours  Scheduled Meds: . amLODipine  5 mg Oral Daily  . aspirin EC  81 mg Oral Daily  . ciprofloxacin  400 mg Intravenous Q12H  . cloNIDine  0.3 mg Oral BID  . diclofenac sodium  1 application  Topical 3 times per day  . enoxaparin (LOVENOX) injection  40 mg Subcutaneous Q24H  . estradiol  0.5 mg Oral BID  . feeding supplement (RESOURCE BREEZE)  1 Container Oral Q24H  . folic acid  1 mg Oral QHS  . hydrochlorothiazide  25 mg Oral Daily  . losartan  100 mg Oral Daily  . metoprolol tartrate  12.5 mg Oral BID  . metronidazole  500 mg Intravenous Q8H  . pantoprazole (PROTONIX) IV  40 mg Intravenous Q24H    Continuous Infusions: . sodium chloride 50 mL/hr at 10/17/13 0000    Past Medical History  Diagnosis Date  . Hypertension   . Normal cardiac stress test     pt can't remember when or where  . Diabetes mellitus without complication     Type 2 NIDDM x 15 years  . Arthritis     osteoarthritis  . Hyperlipidemia   . Hormone replacement therapy (postmenopausal)   . Fatty liver disease, nonalcoholic 2703    hospitalized, MRCP dx fatty liver  . Rheumatoid arthritis(714.0) 09/28/2012  . Obesity, unspecified 12/27/2012  . GERD (gastroesophageal reflux disease)   . Chronic cholecystitis with calculus 05/04/2013  . Unspecified essential hypertension 05/04/2013  . Diabetes type 2, controlled 05/04/2013  . Bursitis of right shoulder     Past Surgical History  Procedure Laterality Date  . Joint replacement      left knee  . Abdominal hysterectomy    . Tonsillectomy    . Nasal sinus surgery    . Total knee arthroplasty  08/01/2012    Procedure: TOTAL KNEE ARTHROPLASTY;  Surgeon: Meredith Pel, MD;  Location: [place];  Service: Orthopedics;  Laterality: Right;  Right total knee arthroplasty  . Total knee arthroplasty  08/01/2012    RIGHT  KNEE  . Ercp N/A 05/02/2013    Procedure: ENDOSCOPIC RETROGRADE CHOLANGIOPANCREATOGRAPHY (ERCP);  Surgeon: Beryle Beams, MD;  Location: [facility];  Service: Endoscopy;  Laterality: N/A;  . Sphincterotomy  05/02/2013    Procedure: SPHINCTEROTOMY;  Surgeon: Beryle Beams, MD;  Location: [facility];  Service: Endoscopy;;  . Cholecystectomy  N/A 05/04/2013    Procedure: LAPAROSCOPIC CHOLECYSTECTOMY WITH INTRAOPERATIVE CHOLANGIOGRAM;  Surgeon: Haywood Lasso, MD;  Location: [facility];  Service: General;  Laterality: N/A;    Atlee Abide MS RD LDN Clinical Dietitian [phone]

## 2013-10-17 NOTE — Care Management Note (Signed)
   CARE MANAGEMENT NOTE 10/17/2013  Patient:  Mattioli,Glenora   Account Number:  [id number]  Date Initiated:  10/17/2013  Documentation initiated by:  Roshunda Keir  Subjective/Objective Assessment:   68 yo female admitted with vomitting.PCP: Karis Juba, PA-C     Action/Plan:   Home when stable   Anticipated DC Date:     Anticipated DC Plan:  [place]  CM consult      Choice offered to / List presented to:  NA   DME arranged  NA      DME agency  NA     [place] arranged  NA      [place] agency  NA   Status of service:  In process, will continue to follow Medicare Important Message given?   (If response is "NO", the following Medicare IM given date fields will be blank) Date Medicare IM given:   Date Additional Medicare IM given:    Discharge Disposition:    Per UR Regulation:  Reviewed for med. necessity/level of care/duration of stay  If discussed at [place] of Stay Meetings, dates discussed:    Comments:  10/17/13 Lenoir Whittley Carandang,MSN,RN [phone] Chart reviewed for utilization of services. No needs identified at this time.

## 2013-10-18 LAB — URINE CULTURE
CULTURE: NO GROWTH
Colony Count: NO GROWTH

## 2013-10-18 LAB — BASIC METABOLIC PANEL
BUN: 6 mg/dL (ref 6–23)
CALCIUM: 8.1 mg/dL — AB (ref 8.4–10.5)
CO2: 22 mEq/L (ref 19–32)
CREATININE: 0.74 mg/dL (ref 0.50–1.10)
Chloride: 102 mEq/L (ref 96–112)
GFR calc non Af Amer: 85 mL/min — ABNORMAL LOW (ref >90)
Glucose, Bld: 99 mg/dL (ref 70–99)
Potassium: 3.6 mEq/L — ABNORMAL LOW (ref 3.7–5.3)
Sodium: 137 mEq/L (ref 137–147)

## 2013-10-18 MED ORDER — K PHOS MONO-SOD PHOS DI & MONO 155-852-130 MG PO TABS
250.0000 mg | ORAL_TABLET | Freq: Two times a day (BID) | ORAL | Status: AC
  Administered 2013-10-18 (×2): 250 mg via ORAL
  Filled 2013-10-18 (×2): qty 1

## 2013-10-18 MED ORDER — SIMETHICONE 80 MG PO CHEW
80.0000 mg | CHEWABLE_TABLET | Freq: Four times a day (QID) | ORAL | Status: DC
  Administered 2013-10-19 – 2013-10-21 (×7): 80 mg via ORAL
  Filled 2013-10-18 (×16): qty 1

## 2013-10-18 MED ORDER — POLYETHYLENE GLYCOL 3350 17 G PO PACK
17.0000 g | PACK | Freq: Every day | ORAL | Status: DC
  Administered 2013-10-19: 17 g via ORAL
  Filled 2013-10-18 (×4): qty 1

## 2013-10-18 NOTE — Progress Notes (Signed)
[facility] PROGRESS NOTE  [name] [id number]:[id number] DOB: Dec 18, 1945 DOA: 10/16/2013 PCP: Dena Billet BETH, PA-C  Assessment/Plan: 1. Enteritis-Improving, continue with Cipro, Flagyl. Tolerating clear liquid diet. Continue Dilaudid prn for pain. Continue with zofran prn for nausea/vomiting. 2. Hypokalemia- Sec to diarrhea, replaced potassium. 3. Mild transaminitis- resolved, likely due to above. 4. DM- Continue with SSI 5. Hypertension- BP stable, continue with Norvasc, HCTZ, Catapres, Cozaar.  Code Status: Full code  Family Communication: *No family at bedside Disposition Plan: Home when stable   Consultants:  None  Procedures:  None  Antibiotics:  None  HPI/Subjective: Patient seen and examined, admitted with vomiting and diarrhea. Feels better today. Objective: Filed Vitals:   10/18/13 1402  BP: 135/72  Pulse: 85  Temp: 98 F (36.7 C)  Resp: 16    Intake/Output Summary (Last 24 hours) at 10/18/13 1417 Last data filed at 10/18/13 0900  Gross per 24 hour  Intake   2624 ml  Output   2225 ml  Net    399 ml   Filed Weights   10/16/13 2351  Weight: 66.7 kg (147 lb 0.8 oz)    Exam:        Physical Exam: Head: Normocephalic, atraumatic.  Eyes: No signs of jaundice, EOMI Nose: Mucous membranes dry.  Throat: Oropharynx nonerythematous, no exudate appreciated.  Neck: supple,No deformities, masses, or tenderness noted. Lungs: Normal respiratory effort. B/L Clear to auscultation, no crackles or wheezes.  Heart: Regular RR. S1 and S2 normal  Abdomen: BS normoactive. Soft, Nondistended, non-tender.  Extremities: No pretibial edema, no erythema   Data Reviewed: Basic Metabolic Panel:  Recent Labs Lab 10/15/13 0039 10/16/13 1834 10/17/13 0015 10/17/13 0831 10/18/13 0327  NA 142 145  --  140 137  K 3.0* 3.2*  --  3.3* 3.6*  CL 103 100  --  103 102  CO2 20 18*  --  18* 22  GLUCOSE 95 137*  --  95 99  BUN 17 14  --  8 6  CREATININE 0.74 0.76  --   0.60 0.74  CALCIUM 8.2* 8.9  --  8.2* 8.1*  MG  --   --  0.7*  --   --   PHOS  --   --  2.0*  --   --    Liver Function Tests:  Recent Labs Lab 10/15/13 0039 10/16/13 1834 10/17/13 0831  AST 63* 43* 33  ALT 45* 39* 31  ALKPHOS 164* 155* 124*  BILITOT 0.6 0.5 0.4  PROT 6.5 7.9 6.5  ALBUMIN 3.0* 3.8 3.2*    Recent Labs Lab 10/15/13 0039 10/16/13 1834  LIPASE 19 12   No results found for this basename: AMMONIA,  in the last 168 hours CBC:  Recent Labs Lab 10/15/13 0039 10/16/13 1834 10/17/13 0831  WBC 7.7 5.1 7.4  NEUTROABS 6.5 3.3  --   HGB 11.6* 12.1 12.2  HCT 35.4* 39.0 36.7  MCV 96.7 98.0 96.1  PLT 154 235 215   Cardiac Enzymes:  Recent Labs Lab 10/15/13 0454  TROPONINI <0.30   BNP (last 3 results) No results found for this basename: PROBNP,  in the last 8760 hours CBG: No results found for this basename: GLUCAP,  in the last 168 hours  Recent Results (from the past 240 hour(s))  URINE CULTURE     Status: None   Collection Time    10/16/13  6:28 PM      Result Value Ref Range Status   Specimen Description URINE, RANDOM  Final   Special Requests NONE   Final   Culture  Setup Time     Final   Value: 10/17/2013 03:47     Performed at [place]     Final   Value: NO GROWTH     Performed at [facility]   Culture     Final   Value: NO GROWTH     Performed at [facility]   Report Status 10/18/2013 FINAL   Final     Studies: Ct Abdomen Pelvis W Contrast  10/16/2013   CLINICAL DATA:  Epigastric pain with nausea and vomiting.  EXAM: CT ABDOMEN AND PELVIS WITH CONTRAST  TECHNIQUE: Multidetector CT imaging of the abdomen and pelvis was performed using the standard protocol following bolus administration of intravenous contrast.  CONTRAST:  86mL OMNIPAQUE IOHEXOL 300 MG/ML SOLN, 151mL OMNIPAQUE IOHEXOL 300 MG/ML SOLN  COMPARISON:  DG CHOLANGIOGRAM OPERATIVE dated 05/04/2013; MR MRCP dated 02/24/2009  FINDINGS:  Lung Bases: Dependent atelectasis.  Liver:  Probable fatty liver.  Tiny cyst in the left hepatic dome.  Spleen:  Normal.  Gallbladder:  Surgically absent.  Clips in the fossa.  Common bile duct: Postcholecystectomy dilation of the common bile duct. Mild intrahepatic biliary ductal dilation commonly seen after cholecystectomy.  Pancreas:  Normal.  Adrenal glands:  Normal bilaterally.  Kidneys: Bilateral renal cysts, some of which show enlargement compared to prior MRI. Normal renal enhancement. Normal delayed excretion of contrast. Both ureters appear within normal limits.  Stomach: Patulous gastroesophageal junction and small hiatal hernia.  Small bowel:  Normal.  Colon: Appendix not identified. No right lower quadrant inflammatory changes. Nonspecific fluid levels within the colon. No mural thickening or pericolonic inflammatory changes. Rectum appears normal.  Pelvic Genitourinary: Hysterectomy. Normal urinary bladder. No free fluid in the pelvis.  Bones: No aggressive osseous lesions. Severe lumbar spondylosis and scoliosis.  Vasculature: Atherosclerosis without an acute vascular abnormality.  Body Wall: Fat containing periumbilical hernia.  IMPRESSION: 1. Nonspecific fluid levels within the colon. These are most commonly associated with enteric infection. 2. Cholecystectomy and hysterectomy.   Electronically Signed   By: Dereck Ligas M.D.   On: 10/16/2013 20:42    Scheduled Meds: . amLODipine  5 mg Oral Daily  . aspirin EC  81 mg Oral Daily  . ciprofloxacin  400 mg Intravenous Q12H  . cloNIDine  0.3 mg Oral BID  . diclofenac sodium  1 application Topical 3 times per day  . enoxaparin (LOVENOX) injection  40 mg Subcutaneous Q24H  . estradiol  0.5 mg Oral BID  . feeding supplement (RESOURCE BREEZE)  1 Container Oral Q24H  . folic acid  1 mg Oral QHS  . guaiFENesin  600 mg Oral Once  . hydrochlorothiazide  25 mg Oral Daily  . losartan  100 mg Oral Daily  . metoprolol tartrate  12.5 mg Oral BID   . metronidazole  500 mg Intravenous Q8H  . pantoprazole (PROTONIX) IV  40 mg Intravenous Q24H  . phosphorus  250 mg Oral BID  . polyethylene glycol  17 g Oral Daily  . simethicone  80 mg Oral QID   Continuous Infusions: . sodium chloride 50 mL/hr at 10/18/13 1206    Active Problems:   Gastroenteritis    Time spent: 25 min    [facility] Pager [phone]. If 7PM-7AM, please contact night-coverage at [email or web address], password [facility] 10/18/2013, 2:17 PM  LOS: 2 days

## 2013-10-18 NOTE — Progress Notes (Signed)
Patient without a bowel movement for "several days". -

## 2013-10-18 NOTE — Progress Notes (Addendum)
Patient nauseated and refusing KDUR tabs. NP mad aware order changed to IV replacement

## 2013-10-18 NOTE — Plan of Care (Signed)
Problem: Phase I Progression Outcomes Goal: Other Phase I Outcomes/Goals Outcome: Progressing Patient with continual nausea complaints. Prns given with decreasing nausea noted. Patient also c/o post nasal drip and scratchy throat. NP on call made aware new order for tylenol and mucinex obtained. Patient refused mucinex due to previous use and "not liking the way it made her feel".

## 2013-10-18 NOTE — Clinical Documentation Improvement (Signed)
INITIAL NUTRITION ASSESSMENT   10/17/13  Possible Clinical Conditions?  Severe Malnutrition   Protein Calorie Malnutrition Severe Protein Calorie Malnutrition  Moderate Malnutrition  Other Condition Cannot clinically determine Pt meets criteria for moderate MALNUTRITION in the context of chronic illness as evidenced by PO intake <75% for one month, 12% weight loss in 3 months.  Supporting Information: Risk Factors: Signs & Symptoms: Diagnostics:Height: 5'  Wt: 147 lbs  BMI: 28  Treatment:INTERVENTION:  -Resource Breeze po Q24H, each supplement provides 250 kcal and 9 grams of protein  -Monitor refeeding labs-pt with low phos/k/mag and one month sub-optimal intake  -Diet advancement per MD Monitor Total protein/energy intake, Labs, weights, GI profile, diet order    Thank Glendora Score ,RN Clinical Documentation Specialist:  [place] [facility]

## 2013-10-18 NOTE — Progress Notes (Signed)
Patient c/i sinus headache and post nasal drip-Paged NP Baltazar Najjar on call-orders received for tylenol and mucinex-(patient refused mucinex)

## 2013-10-19 LAB — BASIC METABOLIC PANEL
BUN: 10 mg/dL (ref 6–23)
CALCIUM: 8.3 mg/dL — AB (ref 8.4–10.5)
CO2: 19 mEq/L (ref 19–32)
CREATININE: 0.78 mg/dL (ref 0.50–1.10)
Chloride: 108 mEq/L (ref 96–112)
GFR calc non Af Amer: 83 mL/min — ABNORMAL LOW (ref >90)
Glucose, Bld: 75 mg/dL (ref 70–99)
Potassium: 3.1 mEq/L — ABNORMAL LOW (ref 3.7–5.3)
Sodium: 143 mEq/L (ref 137–147)

## 2013-10-19 LAB — PHOSPHORUS: Phosphorus: 2.5 mg/dL (ref 2.3–4.6)

## 2013-10-19 MED ORDER — POTASSIUM CHLORIDE 10 MEQ/100ML IV SOLN
10.0000 meq | INTRAVENOUS | Status: AC
  Administered 2013-10-19 (×3): 10 meq via INTRAVENOUS
  Filled 2013-10-19 (×3): qty 100

## 2013-10-19 MED ORDER — HYDRALAZINE HCL 25 MG PO TABS
25.0000 mg | ORAL_TABLET | Freq: Four times a day (QID) | ORAL | Status: DC | PRN
  Filled 2013-10-19: qty 1

## 2013-10-19 NOTE — Progress Notes (Addendum)
[facility] PROGRESS NOTE  [name] [id number]:[id number] DOB: [birth year] DOA: 10/16/2013 PCP: Dena Billet BETH, PA-C  Assessment/Plan: 1. Enteritis-Improving, continue with Cipro, Flagyl. Tolerating clear liquid diet. Continue Dilaudid prn for pain. Continue with zofran prn for nausea/vomiting. 2. Hypokalemia- Sec to diarrhea, \will replace the potassium and hold HCTZ at this time. 3. Mild transaminitis- resolved, likely due to above. 4. DM- Continue with SSI 5. Hypertension- BP stable, continue with Norvasc,  Catapres, Cozaar, HCTZ is on hold due to hypokalemia.  Code Status: Full code  Family Communication: *No family at bedside Disposition Plan: Home when stable   Consultants:  None  Procedures:  None  Antibiotics:  None  HPI/Subjective: Patient seen and examined, admitted with vomiting and diarrhea. Feels better today. Wants to try the diet. No more diarrhea since she came to the hospital. Objective: Filed Vitals:   10/19/13 0507  BP: 117/60  Pulse: 71  Temp: 97.5 F (36.4 C)  Resp: 14    Intake/Output Summary (Last 24 hours) at 10/19/13 1042 Last data filed at 10/19/13 0513  Gross per 24 hour  Intake   2400 ml  Output   1225 ml  Net   1175 ml   Filed Weights   10/16/13 2351  Weight: 66.7 kg (147 lb 0.8 oz)    Exam:        Physical Exam: Head: Normocephalic, atraumatic.  Eyes: No signs of jaundice, EOMI Nose: Mucous membranes dry.  Throat: Oropharynx nonerythematous, no exudate appreciated.  Neck: supple,No deformities, masses, or tenderness noted. Lungs: Normal respiratory effort. B/L Clear to auscultation, no crackles or wheezes.  Heart: Regular RR. S1 and S2 normal  Abdomen: BS normoactive. Soft, Nondistended, non-tender.  Extremities: No pretibial edema, no erythema   Data Reviewed: Basic Metabolic Panel:  Recent Labs Lab 10/15/13 0039 10/16/13 1834 10/17/13 0015 10/17/13 0831 10/18/13 0327 10/19/13 0330  NA 142 145  --  140 137  143  K 3.0* 3.2*  --  3.3* 3.6* 3.1*  CL 103 100  --  103 102 108  CO2 20 18*  --  18* 22 19  GLUCOSE 95 137*  --  95 99 75  BUN 17 14  --  8 6 10   CREATININE 0.74 0.76  --  0.60 0.74 0.78  CALCIUM 8.2* 8.9  --  8.2* 8.1* 8.3*  MG  --   --  0.7*  --   --   --   PHOS  --   --  2.0*  --   --  2.5   Liver Function Tests:  Recent Labs Lab 10/15/13 0039 10/16/13 1834 10/17/13 0831  AST 63* 43* 33  ALT 45* 39* 31  ALKPHOS 164* 155* 124*  BILITOT 0.6 0.5 0.4  PROT 6.5 7.9 6.5  ALBUMIN 3.0* 3.8 3.2*    Recent Labs Lab 10/15/13 0039 10/16/13 1834  LIPASE 19 12   No results found for this basename: AMMONIA,  in the last 168 hours CBC:  Recent Labs Lab 10/15/13 0039 10/16/13 1834 10/17/13 0831  WBC 7.7 5.1 7.4  NEUTROABS 6.5 3.3  --   HGB 11.6* 12.1 12.2  HCT 35.4* 39.0 36.7  MCV 96.7 98.0 96.1  PLT 154 235 215   Cardiac Enzymes:  Recent Labs Lab 10/15/13 0454  TROPONINI <0.30   BNP (last 3 results) No results found for this basename: PROBNP,  in the last 8760 hours CBG: No results found for this basename: GLUCAP,  in the last 168 hours  Recent Results (  from the past 240 hour(s))  URINE CULTURE     Status: None   Collection Time    10/16/13  6:28 PM      Result Value Ref Range Status   Specimen Description URINE, RANDOM   Final   Special Requests NONE   Final   Culture  Setup Time     Final   Value: 10/17/2013 03:47     Performed at [place]     Final   Value: NO GROWTH     Performed at [facility]   Culture     Final   Value: NO GROWTH     Performed at [facility]   Report Status 10/18/2013 FINAL   Final     Studies: No results found.  Scheduled Meds: . amLODipine  5 mg Oral Daily  . aspirin EC  81 mg Oral Daily  . ciprofloxacin  400 mg Intravenous Q12H  . cloNIDine  0.3 mg Oral BID  . diclofenac sodium  1 application Topical 3 times per day  . enoxaparin (LOVENOX) injection  40 mg Subcutaneous  Q24H  . estradiol  0.5 mg Oral BID  . feeding supplement (RESOURCE BREEZE)  1 Container Oral Q24H  . folic acid  1 mg Oral QHS  . guaiFENesin  600 mg Oral Once  . losartan  100 mg Oral Daily  . metoprolol tartrate  12.5 mg Oral BID  . metronidazole  500 mg Intravenous Q8H  . pantoprazole (PROTONIX) IV  40 mg Intravenous Q24H  . polyethylene glycol  17 g Oral Daily  . potassium chloride  10 mEq Intravenous Q1 Hr x 3  . simethicone  80 mg Oral QID   Continuous Infusions: . sodium chloride 50 mL/hr at 10/18/13 1206    Active Problems:   Gastroenteritis    Time spent: 25 min    [place] [facility] Pager [phone]. If 7PM-7AM, please contact night-coverage at [email or web address], password [facility] 10/19/2013, 10:42 AM  LOS: 3 days

## 2013-10-20 ENCOUNTER — Inpatient Hospital Stay (HOSPITAL_COMMUNITY): Payer: BC Managed Care – PPO

## 2013-10-20 DIAGNOSIS — E876 Hypokalemia: Secondary | ICD-10-CM

## 2013-10-20 LAB — BASIC METABOLIC PANEL
BUN: 9 mg/dL (ref 6–23)
CALCIUM: 7.8 mg/dL — AB (ref 8.4–10.5)
CO2: 20 mEq/L (ref 19–32)
Chloride: 113 mEq/L — ABNORMAL HIGH (ref 96–112)
Creatinine, Ser: 0.72 mg/dL (ref 0.50–1.10)
GFR calc Af Amer: >90 mL/min (ref >90)
GFR calc non Af Amer: 86 mL/min — ABNORMAL LOW (ref >90)
Glucose, Bld: 111 mg/dL — ABNORMAL HIGH (ref 70–99)
Potassium: 3.4 mEq/L — ABNORMAL LOW (ref 3.7–5.3)
Sodium: 144 mEq/L (ref 137–147)

## 2013-10-20 MED ORDER — POTASSIUM CHLORIDE CRYS ER 20 MEQ PO TBCR
40.0000 meq | EXTENDED_RELEASE_TABLET | ORAL | Status: AC
  Administered 2013-10-20 (×2): 40 meq via ORAL
  Filled 2013-10-20 (×2): qty 2

## 2013-10-20 MED ORDER — BISACODYL 10 MG RE SUPP
10.0000 mg | Freq: Every day | RECTAL | Status: DC | PRN
  Administered 2013-10-20: 10 mg via RECTAL
  Filled 2013-10-20: qty 1

## 2013-10-20 NOTE — Progress Notes (Signed)
MD, Patient now reports no BM since the 24th.  She was given Miralax yesterday with no relief. Could we try something different please? Azzie Glatter [place]

## 2013-10-20 NOTE — Progress Notes (Signed)
[facility] PROGRESS NOTE  [name] WNI:[id number] DOB: 09-Jun-1946 DOA: 10/16/2013 PCP: Dena Billet BETH, PA-C  Assessment/Plan: 1. Enteritis-Improving, continue with Cipro, Flagyl. Tolerating clear liquid diet. Continue Dilaudid prn for pain. Continue with zofran prn for nausea/vomiting. 2. Constipation- CT abdomen showed air fluid levels on the day of admission, will obtain Abdomen xray 2 views today to r/o ileus, will also give dulcolax suppository. 3. Hypokalemia- Sec to diarrhea, will replace the potassium and hold HCTZ at this time. 4. Mild transaminitis- resolved, likely due to above. 5. DM- Continue with SSI 6. Hypertension- BP stable, continue with Norvasc,  Catapres, Cozaar, HCTZ is on hold due to hypokalemia.  Code Status: Full code  Family Communication: *No family at bedside Disposition Plan: Home when stable   Consultants:  None  Procedures:  None  Antibiotics:  None  HPI/Subjective: Patient seen and examined, admitted with vomiting and diarrhea. Feels better today. No BM yet, she was started on miralax yesterday. Objective: Filed Vitals:   10/20/13 0514  BP: 114/57  Pulse: 57  Temp: 97.5 F (36.4 C)  Resp: 14    Intake/Output Summary (Last 24 hours) at 10/20/13 0804 Last data filed at 10/20/13 0514  Gross per 24 hour  Intake 2920.84 ml  Output    900 ml  Net 2020.84 ml   Filed Weights   10/16/13 2351  Weight: 66.7 kg (147 lb 0.8 oz)    Exam:        Physical Exam: Head: Normocephalic, atraumatic.  Eyes: No signs of jaundice, EOMI Nose: Mucous membranes dry.  Throat: Oropharynx nonerythematous, no exudate appreciated.  Neck: supple,No deformities, masses, or tenderness noted. Lungs: Normal respiratory effort. B/L Clear to auscultation, no crackles or wheezes.  Heart: Regular RR. S1 and S2 normal  Abdomen: BS normoactive. Soft, Nondistended, non-tender.  Extremities: No pretibial edema, no erythema   Data Reviewed: Basic  Metabolic Panel:  Recent Labs Lab 10/16/13 1834 10/17/13 0015 10/17/13 0831 10/18/13 0327 10/19/13 0330 10/20/13 0435  NA 145  --  140 137 143 144  K 3.2*  --  3.3* 3.6* 3.1* 3.4*  CL 100  --  103 102 108 113*  CO2 18*  --  18* 22 19 20   GLUCOSE 137*  --  95 99 75 111*  BUN 14  --  8 6 10 9   CREATININE 0.76  --  0.60 0.74 0.78 0.72  CALCIUM 8.9  --  8.2* 8.1* 8.3* 7.8*  MG  --  0.7*  --   --   --   --   PHOS  --  2.0*  --   --  2.5  --    Liver Function Tests:  Recent Labs Lab 10/15/13 0039 10/16/13 1834 10/17/13 0831  AST 63* 43* 33  ALT 45* 39* 31  ALKPHOS 164* 155* 124*  BILITOT 0.6 0.5 0.4  PROT 6.5 7.9 6.5  ALBUMIN 3.0* 3.8 3.2*    Recent Labs Lab 10/15/13 0039 10/16/13 1834  LIPASE 19 12   No results found for this basename: AMMONIA,  in the last 168 hours CBC:  Recent Labs Lab 10/15/13 0039 10/16/13 1834 10/17/13 0831  WBC 7.7 5.1 7.4  NEUTROABS 6.5 3.3  --   HGB 11.6* 12.1 12.2  HCT 35.4* 39.0 36.7  MCV 96.7 98.0 96.1  PLT 154 235 215   Cardiac Enzymes:  Recent Labs Lab 10/15/13 0454  TROPONINI <0.30   BNP (last 3 results) No results found for this basename: PROBNP,  in the  last 8760 hours CBG: No results found for this basename: GLUCAP,  in the last 168 hours  Recent Results (from the past 240 hour(s))  URINE CULTURE     Status: None   Collection Time    10/16/13  6:28 PM      Result Value Ref Range Status   Specimen Description URINE, RANDOM   Final   Special Requests NONE   Final   Culture  Setup Time     Final   Value: 10/17/2013 03:47     Performed at [place]     Final   Value: NO GROWTH     Performed at [facility]   Culture     Final   Value: NO GROWTH     Performed at [facility]   Report Status 10/18/2013 FINAL   Final     Studies: No results found.  Scheduled Meds: . amLODipine  5 mg Oral Daily  . aspirin EC  81 mg Oral Daily  . ciprofloxacin  400 mg  Intravenous Q12H  . cloNIDine  0.3 mg Oral BID  . diclofenac sodium  1 application Topical 3 times per day  . enoxaparin (LOVENOX) injection  40 mg Subcutaneous Q24H  . estradiol  0.5 mg Oral BID  . feeding supplement (RESOURCE BREEZE)  1 Container Oral Q24H  . folic acid  1 mg Oral QHS  . guaiFENesin  600 mg Oral Once  . losartan  100 mg Oral Daily  . metoprolol tartrate  12.5 mg Oral BID  . metronidazole  500 mg Intravenous Q8H  . pantoprazole (PROTONIX) IV  40 mg Intravenous Q24H  . polyethylene glycol  17 g Oral Daily  . potassium chloride  40 mEq Oral Q4H  . simethicone  80 mg Oral QID   Continuous Infusions: . sodium chloride 50 mL/hr at 10/18/13 1206    Active Problems:   Gastroenteritis    Time spent: 25 min    [facility] Pager [phone]. If 7PM-7AM, please contact night-coverage at [email or web address], password [facility] 10/20/2013, 8:04 AM  LOS: 4 days

## 2013-10-21 DIAGNOSIS — K5289 Other specified noninfective gastroenteritis and colitis: Secondary | ICD-10-CM

## 2013-10-21 DIAGNOSIS — K219 Gastro-esophageal reflux disease without esophagitis: Secondary | ICD-10-CM

## 2013-10-21 LAB — BASIC METABOLIC PANEL
BUN: 6 mg/dL (ref 6–23)
CALCIUM: 8.3 mg/dL — AB (ref 8.4–10.5)
CHLORIDE: 109 meq/L (ref 96–112)
CO2: 21 mEq/L (ref 19–32)
CREATININE: 0.66 mg/dL (ref 0.50–1.10)
GFR calc non Af Amer: 88 mL/min — ABNORMAL LOW (ref >90)
Glucose, Bld: 109 mg/dL — ABNORMAL HIGH (ref 70–99)
Potassium: 3.7 mEq/L (ref 3.7–5.3)
Sodium: 142 mEq/L (ref 137–147)

## 2013-10-21 LAB — CBC
HCT: 29 % — ABNORMAL LOW (ref 36.0–46.0)
Hemoglobin: 9.6 g/dL — ABNORMAL LOW (ref 12.0–15.0)
MCH: 31.5 pg (ref 26.0–34.0)
MCHC: 33.1 g/dL (ref 30.0–36.0)
MCV: 95.1 fL (ref 78.0–100.0)
PLATELETS: 216 10*3/uL (ref 150–400)
RBC: 3.05 MIL/uL — ABNORMAL LOW (ref 3.87–5.11)
RDW: 17.1 % — AB (ref 11.5–15.5)
WBC: 7.4 10*3/uL (ref 4.0–10.5)

## 2013-10-21 NOTE — Discharge Summary (Signed)
Physician Discharge Summary  [name] [id number] DOB: [birth year] DOA: 10/16/2013  PCP: Karis Juba, PA-C  Admit date: 10/16/2013 Discharge date: 10/21/2013  Time spent: 50* minutes  Recommendations for Outpatient Follow-up:  1. Follow up PCP in 2 weeks  Discharge Diagnoses:  Active Problems:   Gastroenteritis   Discharge Condition: Stable  Diet recommendation: low salt diet  Filed Weights   10/16/13 2351  Weight: 66.7 kg (147 lb 0.8 oz)    History of present illness:  68 year old female with HTN, HLD, DM, RA, presents to [facility] ED with main concern of progressively worsening non bloody vomiting and non bloody diarrhea, that started 4 days PTA, associated with poor oral intake, unable to keep any fluids or solids. This has need also associated with generalized abdominal discomfort, with cramps, 5/10 in severity with no specific radiating symptoms. Pt denies any specific alleviating factors, no similar events in the past. No known sick contacts or exposures. She was treated for uTI several days PTA with Keflex but was not able to keep anything down. PCP saw her earlier today and sent her to the ED. Pt denies chest pain, no shortness of breath, no urinary concerns.    Hospital Course:  1. Enteritis-Resolved, she was started on Cipro, Flagyl. Tolerated  clear liquid diet. Dilaudid prn for pain. Patient's diet was advanced to the regular diet. She will be discharged home. Stool for C diff never collected as she developed constipation, and now after suppository she had 2 BM's in the hospital. 2. Constipation- Resolved, CT abdomen showed air fluid levels on the day of admission,Abdomen xray 2 views showed nonobstructive bowel gas pattern 3. Hypokalemia- resolved  4. Mild transaminitis- resolved, likely due to above. 5. DM- Continue with Metformin. 6. Hypertension- BP stable, continue with Norvasc, Catapres, Cozaar,  HCTZ. 7.   Procedures:  None  Consultations:  None  Discharge Exam: Filed Vitals:   10/21/13 0948  BP: 147/68  Pulse: 74  Temp:   Resp:     General: *Apear in no acute distress Cardiovascular: S1s2 RRR Respiratory: Clear bilaterally Abdomen - Soft, nontender  Discharge Instructions  Discharge Orders   Future Orders Complete By Expires   Diet - low sodium heart healthy  As directed    Diet - low sodium heart healthy  As directed    Discharge instructions  As directed    Comments:     Patient was admitted to the hospital from 10/16/13- 10/21/13, she will need three more days of rest and can go back to work on 10/25/13   Increase activity slowly  As directed    Increase activity slowly  As directed        Medication List    STOP taking these medications       cephALEXin 500 MG capsule  Commonly known as:  KEFLEX      TAKE these medications       amLODipine 5 MG tablet  Commonly known as:  NORVASC  Take 1 tablet (5 mg total) by mouth daily.     aspirin EC 81 MG tablet  Take 81 mg by mouth daily.     atorvastatin 40 MG tablet  Commonly known as:  LIPITOR  TAKE 1 TABLET DAILY     CALCIUM 600 + D PO  Take 1 tablet by mouth 2 (two) times daily.     cloNIDine 0.3 MG tablet  Commonly known as:  CATAPRES  Take 1 tablet (0.3 mg total) by mouth 2 (two) times daily.  estradiol 0.5 MG tablet  Commonly known as:  ESTRACE  Take 0.5 mg by mouth 2 (two) times daily.     fish oil-omega-3 fatty acids 1000 MG capsule  Take 1 g by mouth at bedtime.     folic acid 1 MG tablet  Commonly known as:  FOLVITE  Take 1 tablet by mouth at bedtime.     losartan-hydrochlorothiazide 100-12.5 MG per tablet  Commonly known as:  HYZAAR  Take 1 tablet by mouth daily.     metFORMIN 1000 MG tablet  Commonly known as:  GLUCOPHAGE  TAKE ONE TABLET BY MOUTH TWICE DAILY     methotrexate 2.5 MG tablet  Commonly known as:  RHEUMATREX  Take 8 tablets by mouth once a week.      metoprolol tartrate 25 MG tablet  Commonly known as:  LOPRESSOR  Take 12.5 mg by mouth 2 (two) times daily.     multivitamins ther. w/minerals Tabs tablet  Take 1 tablet by mouth daily.     omeprazole 20 MG capsule  Commonly known as:  PRILOSEC  Take 1 capsule (20 mg total) by mouth daily.     ondansetron 4 MG tablet  Commonly known as:  ZOFRAN  Take 1 tablet (4 mg total) by mouth every 6 (six) hours.     VOLTAREN 1 % Gel  Generic drug:  diclofenac sodium  Apply 3 g topically 3 (three) times daily. To 3 large joints       Allergies  Allergen Reactions  . Monopril [Fosinopril] Cough  . Norvasc [Amlodipine Besylate]     10 mg dose causes LE edema.  Tolerates 5mg  dose.      The results of significant diagnostics from this hospitalization (including imaging, microbiology, ancillary and laboratory) are listed below for reference.    Significant Diagnostic Studies: Dg Chest 2 View  10/15/2013   CLINICAL DATA:  Nausea, vomiting and diarrhea.  EXAM: CHEST  2 VIEW  COMPARISON:  Chest radiograph performed 11/09/2012  FINDINGS: The lungs are well-aerated. There is persistent elevation of the right hemidiaphragm. There is no evidence of focal opacification, pleural effusion or pneumothorax.  The heart is normal in size; the mediastinal contour is within normal limits. No acute osseous abnormalities are seen. Clips are noted within the right upper quadrant, reflecting prior cholecystectomy. Scattered calcification is seen along the abdominal aorta.  IMPRESSION: Persistent elevation of the right hemidiaphragm. Lungs remain grossly clear.   Electronically Signed   By: Garald Balding M.D.   On: 10/15/2013 01:51   Ct Abdomen Pelvis W Contrast  10/16/2013   CLINICAL DATA:  Epigastric pain with nausea and vomiting.  EXAM: CT ABDOMEN AND PELVIS WITH CONTRAST  TECHNIQUE: Multidetector CT imaging of the abdomen and pelvis was performed using the standard protocol following bolus administration of  intravenous contrast.  CONTRAST:  75mL OMNIPAQUE IOHEXOL 300 MG/ML SOLN, 178mL OMNIPAQUE IOHEXOL 300 MG/ML SOLN  COMPARISON:  DG CHOLANGIOGRAM OPERATIVE dated 05/04/2013; MR MRCP dated 02/24/2009  FINDINGS: Lung Bases: Dependent atelectasis.  Liver:  Probable fatty liver.  Tiny cyst in the left hepatic dome.  Spleen:  Normal.  Gallbladder:  Surgically absent.  Clips in the fossa.  Common bile duct: Postcholecystectomy dilation of the common bile duct. Mild intrahepatic biliary ductal dilation commonly seen after cholecystectomy.  Pancreas:  Normal.  Adrenal glands:  Normal bilaterally.  Kidneys: Bilateral renal cysts, some of which show enlargement compared to prior MRI. Normal renal enhancement. Normal delayed excretion of contrast. Both ureters appear  within normal limits.  Stomach: Patulous gastroesophageal junction and small hiatal hernia.  Small bowel:  Normal.  Colon: Appendix not identified. No right lower quadrant inflammatory changes. Nonspecific fluid levels within the colon. No mural thickening or pericolonic inflammatory changes. Rectum appears normal.  Pelvic Genitourinary: Hysterectomy. Normal urinary bladder. No free fluid in the pelvis.  Bones: No aggressive osseous lesions. Severe lumbar spondylosis and scoliosis.  Vasculature: Atherosclerosis without an acute vascular abnormality.  Body Wall: Fat containing periumbilical hernia.  IMPRESSION: 1. Nonspecific fluid levels within the colon. These are most commonly associated with enteric infection. 2. Cholecystectomy and hysterectomy.   Electronically Signed   By: Dereck Ligas M.D.   On: 10/16/2013 20:42   Dg Abd 2 Views  10/20/2013   CLINICAL DATA:  Abdominal pain, constipation  EXAM: ABDOMEN - 2 VIEW  COMPARISON:  10/16/2013  FINDINGS: Degenerative changes and dextroscoliosis lumbar spine is noted. There is nonobstructive bowel gas pattern. Some gas and nonspecific air-fluid levels are noted within colon. No free abdominal air.  IMPRESSION:  Nonobstructive bowel gas pattern. No free abdominal air. Nonspecific colonic air-fluid levels.   Electronically Signed   By: Lahoma Crocker M.D.   On: 10/20/2013 11:44    Microbiology: Recent Results (from the past 240 hour(s))  URINE CULTURE     Status: None   Collection Time    10/16/13  6:28 PM      Result Value Ref Range Status   Specimen Description URINE, RANDOM   Final   Special Requests NONE   Final   Culture  Setup Time     Final   Value: 10/17/2013 03:47     Performed at [place]     Final   Value: NO GROWTH     Performed at [facility]   Culture     Final   Value: NO GROWTH     Performed at [facility]   Report Status 10/18/2013 FINAL   Final     Labs: Basic Metabolic Panel:  Recent Labs Lab 10/16/13 1834 10/17/13 0015 10/17/13 0831 10/18/13 0327 10/19/13 0330 10/20/13 0435 10/21/13 0420  NA 145  --  140 137 143 144 142  K 3.2*  --  3.3* 3.6* 3.1* 3.4* 3.7  CL 100  --  103 102 108 113* 109  CO2 18*  --  18* 22 19 20 21   GLUCOSE 137*  --  95 99 75 111* 109*  BUN 14  --  8 6 10 9 6   CREATININE 0.76  --  0.60 0.74 0.78 0.72 0.66  CALCIUM 8.9  --  8.2* 8.1* 8.3* 7.8* 8.3*  MG  --  0.7*  --   --   --   --   --   PHOS  --  2.0*  --   --  2.5  --   --    Liver Function Tests:  Recent Labs Lab 10/15/13 0039 10/16/13 1834 10/17/13 0831  AST 63* 43* 33  ALT 45* 39* 31  ALKPHOS 164* 155* 124*  BILITOT 0.6 0.5 0.4  PROT 6.5 7.9 6.5  ALBUMIN 3.0* 3.8 3.2*    Recent Labs Lab 10/15/13 0039 10/16/13 1834  LIPASE 19 12   No results found for this basename: AMMONIA,  in the last 168 hours CBC:  Recent Labs Lab 10/15/13 0039 10/16/13 1834 10/17/13 0831 10/21/13 0420  WBC 7.7 5.1 7.4 7.4  NEUTROABS 6.5 3.3  --   --  HGB 11.6* 12.1 12.2 9.6*  HCT 35.4* 39.0 36.7 29.0*  MCV 96.7 98.0 96.1 95.1  PLT 154 235 215 216   Cardiac Enzymes:  Recent Labs Lab 10/15/13 0454  TROPONINI <0.30   BNP: BNP (last  3 results) No results found for this basename: PROBNP,  in the last 8760 hours CBG: No results found for this basename: GLUCAP,  in the last 168 hours     Signed:  Terresa Marlett S  [facility] 10/21/2013, 10:57 AM

## 2013-10-21 NOTE — Progress Notes (Signed)
10/21/13  Reviewed discharge instructions with patient. Patient verbalized understanding of instructions. Copy of discharge papers given to patient.

## 2013-10-22 LAB — GI PATHOGEN PANEL BY PCR, STOOL
C DIFFICILE TOXIN A/B: NEGATIVE
CAMPYLOBACTER BY PCR: NEGATIVE
CRYPTOSPORIDIUM BY PCR: NEGATIVE
E COLI 0157 BY PCR: NEGATIVE
E coli (ETEC) LT/ST: NEGATIVE
E coli (STEC): NEGATIVE
G LAMBLIA BY PCR: NEGATIVE
Norovirus GI/GII: NEGATIVE
ROTAVIRUS A BY PCR: POSITIVE
Salmonella by PCR: NEGATIVE
Shigella by PCR: NEGATIVE

## 2013-10-23 LAB — OVA AND PARASITE EXAMINATION: Ova and parasites: NONE SEEN

## 2013-10-24 ENCOUNTER — Other Ambulatory Visit: Payer: Self-pay | Admitting: Physician Assistant

## 2013-10-24 LAB — STOOL CULTURE

## 2013-10-24 NOTE — Telephone Encounter (Signed)
Medication refilled per protocol. 

## 2013-11-21 ENCOUNTER — Other Ambulatory Visit: Payer: Self-pay | Admitting: Physician Assistant

## 2013-11-21 ENCOUNTER — Encounter: Payer: Self-pay | Admitting: Physician Assistant

## 2013-11-21 ENCOUNTER — Ambulatory Visit (INDEPENDENT_AMBULATORY_CARE_PROVIDER_SITE_OTHER): Payer: BC Managed Care – PPO | Admitting: Physician Assistant

## 2013-11-21 VITALS — BP 126/76 | HR 64 | Temp 98.3°F | Resp 18 | Ht <= 58 in | Wt 151.0 lb

## 2013-11-21 DIAGNOSIS — K219 Gastro-esophageal reflux disease without esophagitis: Secondary | ICD-10-CM

## 2013-11-21 DIAGNOSIS — K5289 Other specified noninfective gastroenteritis and colitis: Secondary | ICD-10-CM

## 2013-11-21 DIAGNOSIS — E785 Hyperlipidemia, unspecified: Secondary | ICD-10-CM

## 2013-11-21 DIAGNOSIS — K760 Fatty (change of) liver, not elsewhere classified: Secondary | ICD-10-CM

## 2013-11-21 DIAGNOSIS — I10 Essential (primary) hypertension: Secondary | ICD-10-CM

## 2013-11-21 DIAGNOSIS — K7689 Other specified diseases of liver: Secondary | ICD-10-CM

## 2013-11-21 DIAGNOSIS — Z79890 Hormone replacement therapy (postmenopausal): Secondary | ICD-10-CM

## 2013-11-21 DIAGNOSIS — B009 Herpesviral infection, unspecified: Secondary | ICD-10-CM

## 2013-11-21 DIAGNOSIS — E669 Obesity, unspecified: Secondary | ICD-10-CM

## 2013-11-21 DIAGNOSIS — K529 Noninfective gastroenteritis and colitis, unspecified: Secondary | ICD-10-CM

## 2013-11-21 DIAGNOSIS — D649 Anemia, unspecified: Secondary | ICD-10-CM

## 2013-11-21 DIAGNOSIS — M069 Rheumatoid arthritis, unspecified: Secondary | ICD-10-CM

## 2013-11-21 DIAGNOSIS — E119 Type 2 diabetes mellitus without complications: Secondary | ICD-10-CM

## 2013-11-21 HISTORY — DX: Herpesviral infection, unspecified: B00.9

## 2013-11-21 HISTORY — DX: Anemia, unspecified: D64.9

## 2013-11-21 LAB — ANEMIA PANEL
%SAT: 41 % (ref 20–55)
ABS Retic: 19.8 10*3/uL (ref 19.0–186.0)
Ferritin: 156 ng/mL (ref 10–291)
Iron: 129 ug/dL (ref 42–145)
RBC.: 3.3 MIL/uL — ABNORMAL LOW (ref 3.87–5.11)
Retic Ct Pct: 0.6 % (ref 0.4–2.3)
TIBC: 311 ug/dL (ref 250–470)
UIBC: 182 ug/dL (ref 125–400)
VITAMIN B 12: 241 pg/mL (ref 211–911)

## 2013-11-21 LAB — MICROALBUMIN, URINE: Microalb, Ur: 1.25 mg/dL (ref 0.00–1.89)

## 2013-11-21 LAB — COMPLETE METABOLIC PANEL WITH GFR
ALK PHOS: 98 U/L (ref 39–117)
ALT: 24 U/L (ref 0–35)
AST: 23 U/L (ref 0–37)
Albumin: 4 g/dL (ref 3.5–5.2)
BILIRUBIN TOTAL: 0.9 mg/dL (ref 0.2–1.2)
BUN: 18 mg/dL (ref 6–23)
CO2: 25 mEq/L (ref 19–32)
Calcium: 10 mg/dL (ref 8.4–10.5)
Chloride: 103 mEq/L (ref 96–112)
Creat: 0.7 mg/dL (ref 0.50–1.10)
GFR, EST NON AFRICAN AMERICAN: 89 mL/min
GFR, Est African American: >89 mL/min
Glucose, Bld: 111 mg/dL — ABNORMAL HIGH (ref 70–99)
Potassium: 3.9 mEq/L (ref 3.5–5.3)
Sodium: 140 mEq/L (ref 135–145)
Total Protein: 6.7 g/dL (ref 6.0–8.3)

## 2013-11-21 LAB — LIPID PANEL
CHOLESTEROL: 136 mg/dL (ref 0–200)
HDL: 57 mg/dL (ref >39)
LDL Cholesterol: 57 mg/dL (ref 0–99)
TRIGLYCERIDES: 110 mg/dL (ref <150)
Total CHOL/HDL Ratio: 2.4 Ratio
VLDL: 22 mg/dL (ref 0–40)

## 2013-11-21 LAB — HEMOGLOBIN A1C
Hgb A1c MFr Bld: 5.8 % — ABNORMAL HIGH (ref <5.7)
Mean Plasma Glucose: 120 mg/dL — ABNORMAL HIGH (ref <117)

## 2013-11-21 MED ORDER — VALACYCLOVIR HCL 1 G PO TABS: ORAL_TABLET | ORAL | Status: DC

## 2013-11-22 ENCOUNTER — Telehealth: Payer: Self-pay | Admitting: Family Medicine

## 2013-11-22 LAB — CBC WITH DIFFERENTIAL/PLATELET
Basophils Absolute: 0 10*3/uL (ref 0.0–0.1)
Basophils Relative: 0 % (ref 0–1)
EOS ABS: 0.2 10*3/uL (ref 0.0–0.7)
EOS PCT: 5 % (ref 0–5)
HCT: 32.3 % — ABNORMAL LOW (ref 36.0–46.0)
Hemoglobin: 10.4 g/dL — ABNORMAL LOW (ref 12.0–15.0)
Lymphocytes Relative: 30 % (ref 12–46)
Lymphs Abs: 1.1 10*3/uL (ref 0.7–4.0)
MCH: 31 pg (ref 26.0–34.0)
MCHC: 32.2 g/dL (ref 30.0–36.0)
MCV: 96.4 fL (ref 78.0–100.0)
Monocytes Absolute: 0.1 10*3/uL (ref 0.1–1.0)
Monocytes Relative: 2 % — ABNORMAL LOW (ref 3–12)
Neutro Abs: 2.3 10*3/uL (ref 1.7–7.7)
Neutrophils Relative %: 63 % (ref 43–77)
Platelets: 227 10*3/uL (ref 150–400)
RBC: 3.35 MIL/uL — AB (ref 3.87–5.11)
RDW: 17.7 % — ABNORMAL HIGH (ref 11.5–15.5)
WBC: 3.6 10*3/uL — ABNORMAL LOW (ref 4.0–10.5)

## 2013-11-22 NOTE — Telephone Encounter (Signed)
Message copied by Olena Mater on Thu Nov 22, 2013  3:38 PM ------      Message from: Dena Billet      Created: Thu Nov 22, 2013  1:09 PM       Patient recently had a long hospitalization with vomiting and diarrhea which persisted for at least 7 days.      Her LDL and A1c are lower at this check compared to the past and I think it is secondary to that GI virus.      Tell her to call me if she has any low blood sugar readings.      Otherwise, we'll continue current medications because I  think that now that the GI symptoms have resolved and she is back to normal intake this should all go back to usual.      Tell her that the anemia is improved.      Also did an anemia panel which includes iron, B12, folate and all of these levels are normal. The anemia is secondary to past blood loss. Again, the anemia numbers are actually improving.      Continue all Medicines the same. Call me if any low blood sugar readings. ------

## 2013-11-22 NOTE — Telephone Encounter (Signed)
Refill appropriate and filled per protocol. 

## 2013-11-23 ENCOUNTER — Encounter: Payer: Self-pay | Admitting: Physician Assistant

## 2013-11-23 NOTE — Progress Notes (Signed)
Patient ID: [name] MRN: [id number], DOB: May 08, 1946, 68 y.o. Date of Encounter: @DATE @  Chief Complaint:  Chief Complaint  Patient presents with  . routine check up    is fasting    HPI: 68 y.o. year old white female  presents for routine followup visit.  Since her last visit with me, she had a long hospitalization with symptoms of mostly vomiting. Many, many tests were performed. Patient states that no definitive diagnosis could ever be confirmed and that it was felt that she just had some nonspecific virus. Dates of  that hospitalization, in the computer, are 10/15/13 -  10/21/13.  She states that the symptoms have  finally completely resolved and she is finally back to eating a normal diet. Says she finally got her strength back and is now back to walking almost every day for at least 30 minutes. Says she is feeling good now. Even with exertion is having no angina symptoms.  As at prior office visits, she reports that she still never checks her blood sugar. At prior visit she said that sometimes around 6 PM she would feel a little shaky and would eat something and symptoms would resolve. I discussed that she was going a long amount of time between eating lunch and dinner. Told he to add a healthy snack at 3 PM. Since then she's been eating a yogurt every day at 3 PM and has had no further symptoms of hypoglycemia.  She is taking all of her blood pressure medications as directed. Having no adverse effects. No lower extremity edema since we decreased Norvasc from 10 mg down to 5 mg. Has no lightheadedness.  Taking Lipitor with no myalgias or other adverse effects.  Past Medical History  Diagnosis Date  . Hypertension   . Normal cardiac stress test     pt can't remember when or where  . Diabetes mellitus without complication     Type 2 NIDDM x 15 years  . Arthritis     osteoarthritis  . Hyperlipidemia   . Hormone replacement therapy (postmenopausal)   . Fatty liver  disease, nonalcoholic 5732    hospitalized, MRCP dx fatty liver  . Rheumatoid arthritis(714.0) 09/28/2012  . Obesity, unspecified 12/27/2012  . GERD (gastroesophageal reflux disease)   . Chronic cholecystitis with calculus 05/04/2013  . Unspecified essential hypertension 05/04/2013  . Diabetes type 2, controlled 05/04/2013  . Bursitis of right shoulder      Home Meds: See attached medication section for current medication list. Any medications entered into computer today will not appear on this note's list. The medications listed below were entered prior to today. Current Outpatient Prescriptions on File Prior to Visit  Medication Sig Dispense Refill  . amLODipine (NORVASC) 5 MG tablet Take 1 tablet (5 mg total) by mouth daily.  90 tablet  3  . aspirin EC 81 MG tablet Take 81 mg by mouth daily.      . Calcium Carbonate-Vitamin D (CALCIUM 600 + D PO) Take 1 tablet by mouth 2 (two) times daily.      . cloNIDine (CATAPRES) 0.3 MG tablet Take 1 tablet (0.3 mg total) by mouth 2 (two) times daily.  180 tablet  3  . diclofenac sodium (VOLTAREN) 1 % GEL Apply 3 g topically as needed. To 3 large joints      . estradiol (ESTRACE) 0.5 MG tablet Take 0.5 mg by mouth 2 (two) times daily.      . fish oil-omega-3 fatty acids 1000  MG capsule Take 1 g by mouth at bedtime.       . folic acid (FOLVITE) 1 MG tablet Take 1 tablet by mouth at bedtime.       Marland Kitchen losartan-hydrochlorothiazide (HYZAAR) 100-12.5 MG per tablet TAKE 1 TABLET BY MOUTH DAILY.  90 tablet  1  . metFORMIN (GLUCOPHAGE) 1000 MG tablet TAKE ONE TABLET BY MOUTH TWICE DAILY  180 tablet  1  . methotrexate (RHEUMATREX) 2.5 MG tablet Take 8 tablets by mouth once a week.      . Multiple Vitamins-Minerals (MULTIVITAMINS THER. W/MINERALS) TABS Take 1 tablet by mouth daily.      Marland Kitchen omeprazole (PRILOSEC) 20 MG capsule Take 1 capsule (20 mg total) by mouth daily.  90 capsule  3   No current facility-administered medications on file prior to visit.     Allergies:  Allergies  Allergen Reactions  . Monopril [Fosinopril] Cough  . Norvasc [Amlodipine Besylate]     10 mg dose causes LE edema.  Tolerates 5mg  dose.    History   Social History  . Marital Status: Divorced    Spouse Name: N/A    Number of Children: N/A  . Years of Education: N/A   Occupational History  . Not on file.   Social History Main Topics  . Smoking status: Former Smoker    Types: Cigarettes  . Smokeless tobacco: Never Used     Comment: quit smoking regularly in her 30's; now has an occasional cigarette  . Alcohol Use: Yes     Comment: occasional  . Drug Use: No  . Sexual Activity: No   Other Topics Concern  . Not on file   Social History Narrative  . No narrative on file    Family History  Problem Relation Age of Onset  . Diabetes Other   . Heart disease Other   . Cancer Mother     breast  . Heart attack Mother   . Stroke Father      Review of Systems:  See HPI for pertinent ROS. All other ROS negative.    Physical Exam: Blood pressure 126/76, pulse 64, temperature 98.3 F (36.8 C), temperature source Oral, resp. rate 18, height 4' 9.25" (1.454 m), weight 151 lb (68.493 kg)., Body mass index is 32.4 kg/(m^2). General: Obese WF. Appears in no acute distress. Neck: Supple. No thyromegaly. No lymphadenopathy. No carotid bruits. Lungs: Clear bilaterally to auscultation without wheezes, rales, or rhonchi. Breathing is unlabored. Heart: RRR with S1 S2. No murmurs, rubs, or gallops. Abdomen: Soft, non-tender, non-distended with normoactive bowel sounds. No hepatomegaly. No rebound/guarding. No obvious abdominal masses. Musculoskeletal:  Strength and tone normal for age. Extremities/Skin: Warm and dry. No clubbing or cyanosis. No edema. No rashes or suspicious lesions. Neuro: Alert and oriented X 3. Moves all extremities spontaneously. Gait is normal. CNII-XII grossly in tact. Psych:  Responds to questions appropriately with a normal  affect. Diabetic foot exam: Inspection is normal. No deformities, no ulcerations, no other skin breakdown bilaterally. Sensation testing is normal.  2+ Posterior Tibialis pulses bilaterally. No palpable dorsalis pedis pulses bilaterally.      ASSESSMENT AND PLAN:  68 y.o. year old female with  1. S/P Prolonged Gastroenteritis A1C , Lipids may be lower sec to this.  2. Anemia Patient reports that she was told she had some anemia that was told nothing else about it. I do see that she was anemic in the hospital. We'll followup. - Anemia panel  3. Diabetes type 2,  controlled - COMPLETE METABOLIC PANEL WITH GFR - Hemoglobin A1c - Microalbumin, urine  Labs microalbumin 03/29/12. Repeat now. He is on aspirin 81 mg. She is on ARB. She is intolerant to ACE inhibitors secondary to cough. She is on statin. LDL is at goal less than 70. She saw ophthalmologist for diabetic eye exam November 2014. Foot Exam is good. She reports that she has no neuropathy symptoms.  4. Hyperlipidemia On Lipitor 40 mg - COMPLETE METABOLIC PANEL WITH GFR - Lipid panel  5. Fatty liver disease, nonalcoholic - COMPLETE METABOLIC PANEL WITH GFR  6. Essential hypertension, benign Blood pressure is at goal. On ARB. Intolerant to ACE inhibitor. - COMPLETE METABOLIC PANEL WITH GFR  7. Hormone replacement therapy (postmenopausal) Per Gyn  8. Rheumatoid arthritis(714.0) Per Dr. Estanislado Pandy  9. Obesity, unspecified She is aware of proper low carbohydrate low-fat diet.At  Last visit she had gotten off of her exercise butt she reports that she is back to her exercise.  10. 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 - valACYclovir (VALTREX) 1000 MG tablet; Take 2 pill every 12 hours for 2 doses  Dispense: 4 tablet; Refill: 11  12. Colonoscopy: 04/30/2011 colon polyp. With his repeat out of 10 years. She says she was told to repeat 10  years.  13. Pap smear, mammogram, and BMD : Sees GYN annually and may manage this. Today she reports that they do the mammograms they are in the gynecology office itself. Says his at [place]. I had her sign a release today so that we can get that record to enter into our abstraction for quality metrics.  14. Immunizations: Tdap:                  05/2011 Pneumovax:        05/2011 Prevnar 13:          07/04/2013 Influenza vaccine she did receive this in the fall of 2014 Zostavax:                09/2011   Signed, [facility] [place], [place], [facility] 11/23/2013 4:36 PM

## 2013-11-27 NOTE — Telephone Encounter (Signed)
Pt aware of lab results and provider recommendations 

## 2013-11-28 ENCOUNTER — Encounter: Payer: Self-pay | Admitting: Physician Assistant

## 2014-01-02 ENCOUNTER — Other Ambulatory Visit: Payer: Self-pay | Admitting: Family Medicine

## 2014-01-02 ENCOUNTER — Other Ambulatory Visit: Payer: Self-pay | Admitting: Physician Assistant

## 2014-01-02 NOTE — Telephone Encounter (Signed)
Medication refilled per protocol. 

## 2014-01-22 ENCOUNTER — Other Ambulatory Visit: Payer: Self-pay | Admitting: Family Medicine

## 2014-01-22 DIAGNOSIS — M810 Age-related osteoporosis without current pathological fracture: Secondary | ICD-10-CM

## 2014-03-02 ENCOUNTER — Other Ambulatory Visit: Payer: Self-pay | Admitting: Physician Assistant

## 2014-03-04 ENCOUNTER — Ambulatory Visit (INDEPENDENT_AMBULATORY_CARE_PROVIDER_SITE_OTHER): Payer: BC Managed Care – PPO | Admitting: Physician Assistant

## 2014-03-04 ENCOUNTER — Encounter: Payer: Self-pay | Admitting: Physician Assistant

## 2014-03-04 VITALS — BP 138/80 | HR 64 | Temp 98.7°F | Resp 18 | Wt 146.0 lb

## 2014-03-04 DIAGNOSIS — L03818 Cellulitis of other sites: Secondary | ICD-10-CM

## 2014-03-04 DIAGNOSIS — L02818 Cutaneous abscess of other sites: Secondary | ICD-10-CM

## 2014-03-04 MED ORDER — SULFAMETHOXAZOLE-TMP DS 800-160 MG PO TABS: 1.0000 | ORAL_TABLET | Freq: Two times a day (BID) | ORAL | Status: DC

## 2014-03-04 NOTE — Progress Notes (Signed)
Patient ID: Quintella Mura MRN: [id number], DOB: Nov 22, 1945, 68 y.o. Date of Encounter: 03/04/2014, 3:55 PM    Chief Complaint:  Chief Complaint  Patient presents with  . sore feet    bilateral, has sores on top of left and top and side on right     HPI: 68 y.o. year old white female says that she first noticed the first of these areas a little more than > 2 weeks ago.  Says she's been applying triple antibiotic ointment but does not seem to be helping much. Areas have not been itchy.  Says that she's never had any similar skin problems before. Has never had any skin infections. Never had any problems with her feet.      Home Meds:   Outpatient Prescriptions Prior to Visit  Medication Sig Dispense Refill  . amLODipine (NORVASC) 5 MG tablet TAKE 1 TABLET BY MOUTH EVERY DAY  90 tablet  1  . aspirin EC 81 MG tablet Take 81 mg by mouth daily.      Marland Kitchen atorvastatin (LIPITOR) 40 MG tablet TAKE 1 TABLET DAILY  90 tablet  2  . Calcium Carbonate-Vitamin D (CALCIUM 600 + D PO) Take 1 tablet by mouth 2 (two) times daily.      . cloNIDine (CATAPRES) 0.3 MG tablet Take 1 tablet (0.3 mg total) by mouth 2 (two) times daily.  180 tablet  3  . diclofenac sodium (VOLTAREN) 1 % GEL Apply 3 g topically as needed. To 3 large joints      . estradiol (ESTRACE) 0.5 MG tablet Take 0.5 mg by mouth 2 (two) times daily.      . fish oil-omega-3 fatty acids 1000 MG capsule Take 1 g by mouth at bedtime.       . folic acid (FOLVITE) 1 MG tablet Take 1 tablet by mouth at bedtime.       Marland Kitchen losartan-hydrochlorothiazide (HYZAAR) 100-12.5 MG per tablet TAKE 1 TABLET BY MOUTH DAILY.  90 tablet  1  . metFORMIN (GLUCOPHAGE) 1000 MG tablet TAKE ONE TABLET BY MOUTH TWICE DAILY  180 tablet  0  . methotrexate (RHEUMATREX) 2.5 MG tablet Take 8 tablets by mouth once a week.      . metoprolol tartrate (LOPRESSOR) 25 MG tablet TAKE ONE-HALF (1/2) TABLET TWICE A DAY  90 tablet  2  . Multiple Vitamins-Minerals (MULTIVITAMINS  THER. W/MINERALS) TABS Take 1 tablet by mouth daily.      Marland Kitchen omeprazole (PRILOSEC) 20 MG capsule Take 1 capsule (20 mg total) by mouth daily.  90 capsule  3  . valACYclovir (VALTREX) 1000 MG tablet Take 2 pill every 12 hours for 2 doses  4 tablet  11   No facility-administered medications prior to visit.    Allergies:  Allergies  Allergen Reactions  . Monopril [Fosinopril] Cough  . Norvasc [Amlodipine Besylate]     10 mg dose causes LE edema.  Tolerates 5mg  dose.      Review of Systems: See HPI for pertinent ROS. All other ROS negative.    Physical Exam: Blood pressure 138/80, pulse 64, temperature 98.7 F (37.1 C), temperature source Oral, resp. rate 18, weight 146 lb (66.225 kg)., Body mass index is 31.33 kg/(m^2). General: WNWD WF.  Appears in no acute distress. Lungs: Clear bilaterally to auscultation without wheezes, rales, or rhonchi. Breathing is unlabored. Heart: Regular rhythm. No murmurs, rubs, or gallops. Msk:  Strength and tone normal for age. Skin: On right lower leg: Approximately 2 inches above  the lateral malleolus: there is an approx 3/4 cm diameter area of scab. The superior edge of this is pink and with clear drainage (this was used for culture) On the right leg/foot- at the anterior aspect of the ankle joint level: There is an approximate 1/2 cm diameter area of drying and resolving scab. On the Right Foot: Between the 4th-5th toes: there is approx 3/4 cm area--on surface of 4th toe and 5th toe.   On the Left Foot: On dorsum of foot: Just distal to Ankle joint line: there is resolving, drying area, approx 1/2 cm. Neuro: Alert and oriented X 3. Moves all extremities spontaneously. Gait is normal. CNII-XII grossly in tact. Psych:  Responds to questions appropriately with a normal affect.     ASSESSMENT AND PLAN:  68 y.o. year old female with  1. Cellulitis of other specified site Culture Obtained and sent. Go ahead and start Bactrim empirically. She is to  follow up immediately if any of the sites worsen or are not improving in about 3 days. Otherwise I will followup with her once I receive culture results. She has diabetes--well aware to f/u if worsens at all.  - sulfamethoxazole-trimethoprim (BACTRIM DS) 800-160 MG per tablet; Take 1 tablet by mouth 2 (two) times daily.  Dispense: 20 tablet; Refill: 0 - Culture, routine-abscess   Signed, [id number] [place], [place], [facility] 03/04/2014 3:55 PM

## 2014-03-08 LAB — CULTURE, ROUTINE-ABSCESS: GRAM STAIN: NONE SEEN

## 2014-03-16 ENCOUNTER — Emergency Department (HOSPITAL_COMMUNITY): Payer: BC Managed Care – PPO

## 2014-03-16 ENCOUNTER — Inpatient Hospital Stay (HOSPITAL_COMMUNITY)
Admission: EM | Admit: 2014-03-16 | Discharge: 2014-03-21 | DRG: 872 | Disposition: A | Discharge status: Home / Self Care | Payer: BC Managed Care – PPO | Attending: Internal Medicine | Admitting: Internal Medicine

## 2014-03-16 ENCOUNTER — Inpatient Hospital Stay (HOSPITAL_COMMUNITY): Payer: BC Managed Care – PPO

## 2014-03-16 ENCOUNTER — Encounter (HOSPITAL_COMMUNITY): Payer: Self-pay | Admitting: Emergency Medicine

## 2014-03-16 DIAGNOSIS — A419 Sepsis, unspecified organism: Secondary | ICD-10-CM

## 2014-03-16 DIAGNOSIS — B379 Candidiasis, unspecified: Secondary | ICD-10-CM | POA: Diagnosis present

## 2014-03-16 DIAGNOSIS — N179 Acute kidney failure, unspecified: Secondary | ICD-10-CM | POA: Diagnosis present

## 2014-03-16 DIAGNOSIS — E669 Obesity, unspecified: Secondary | ICD-10-CM

## 2014-03-16 DIAGNOSIS — Z79899 Other long term (current) drug therapy: Secondary | ICD-10-CM

## 2014-03-16 DIAGNOSIS — I498 Other specified cardiac arrhythmias: Secondary | ICD-10-CM | POA: Diagnosis present

## 2014-03-16 DIAGNOSIS — K8010 Calculus of gallbladder with chronic cholecystitis without obstruction: Secondary | ICD-10-CM | POA: Diagnosis present

## 2014-03-16 DIAGNOSIS — N390 Urinary tract infection, site not specified: Secondary | ICD-10-CM | POA: Diagnosis present

## 2014-03-16 DIAGNOSIS — E119 Type 2 diabetes mellitus without complications: Secondary | ICD-10-CM | POA: Diagnosis present

## 2014-03-16 DIAGNOSIS — Z79890 Hormone replacement therapy (postmenopausal): Secondary | ICD-10-CM

## 2014-03-16 DIAGNOSIS — Z6828 Body mass index (BMI) 28.0-28.9, adult: Secondary | ICD-10-CM

## 2014-03-16 DIAGNOSIS — E785 Hyperlipidemia, unspecified: Secondary | ICD-10-CM | POA: Diagnosis present

## 2014-03-16 DIAGNOSIS — R6520 Severe sepsis without septic shock: Secondary | ICD-10-CM

## 2014-03-16 DIAGNOSIS — E440 Moderate protein-calorie malnutrition: Secondary | ICD-10-CM | POA: Diagnosis present

## 2014-03-16 DIAGNOSIS — Z87891 Personal history of nicotine dependence: Secondary | ICD-10-CM

## 2014-03-16 DIAGNOSIS — E538 Deficiency of other specified B group vitamins: Secondary | ICD-10-CM | POA: Diagnosis present

## 2014-03-16 DIAGNOSIS — I10 Essential (primary) hypertension: Secondary | ICD-10-CM | POA: Diagnosis present

## 2014-03-16 DIAGNOSIS — Z96659 Presence of unspecified artificial knee joint: Secondary | ICD-10-CM

## 2014-03-16 DIAGNOSIS — Z7982 Long term (current) use of aspirin: Secondary | ICD-10-CM

## 2014-03-16 DIAGNOSIS — K7689 Other specified diseases of liver: Secondary | ICD-10-CM | POA: Diagnosis present

## 2014-03-16 DIAGNOSIS — M069 Rheumatoid arthritis, unspecified: Secondary | ICD-10-CM | POA: Diagnosis present

## 2014-03-16 DIAGNOSIS — E860 Dehydration: Secondary | ICD-10-CM | POA: Diagnosis present

## 2014-03-16 DIAGNOSIS — D539 Nutritional anemia, unspecified: Secondary | ICD-10-CM

## 2014-03-16 DIAGNOSIS — M0579 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 without organ or systems involvement: Secondary | ICD-10-CM | POA: Diagnosis present

## 2014-03-16 HISTORY — DX: Sepsis, unspecified organism: A41.9

## 2014-03-16 LAB — COMPREHENSIVE METABOLIC PANEL
ALBUMIN: 4.1 g/dL (ref 3.5–5.2)
ALK PHOS: 172 U/L — AB (ref 39–117)
ALT: 42 U/L — ABNORMAL HIGH (ref 0–35)
AST: 32 U/L (ref 0–37)
Anion gap: 27 — ABNORMAL HIGH (ref 5–15)
BILIRUBIN TOTAL: 0.8 mg/dL (ref 0.3–1.2)
BUN: 31 mg/dL — ABNORMAL HIGH (ref 6–23)
CHLORIDE: 97 meq/L (ref 96–112)
CO2: 17 mEq/L — ABNORMAL LOW (ref 19–32)
Calcium: 10.6 mg/dL — ABNORMAL HIGH (ref 8.4–10.5)
Creatinine, Ser: 1.32 mg/dL — ABNORMAL HIGH (ref 0.50–1.10)
GFR calc Af Amer: 47 mL/min — ABNORMAL LOW (ref >90)
GFR calc non Af Amer: 40 mL/min — ABNORMAL LOW (ref >90)
Glucose, Bld: 118 mg/dL — ABNORMAL HIGH (ref 70–99)
Potassium: 4.2 mEq/L (ref 3.7–5.3)
Sodium: 141 mEq/L (ref 137–147)
Total Protein: 8.4 g/dL — ABNORMAL HIGH (ref 6.0–8.3)

## 2014-03-16 LAB — CBC WITH DIFFERENTIAL/PLATELET
BASOS ABS: 0 10*3/uL (ref 0.0–0.1)
Basophils Relative: 0 % (ref 0–1)
Eosinophils Absolute: 0 10*3/uL (ref 0.0–0.7)
Eosinophils Relative: 0 % (ref 0–5)
HCT: 36.4 % (ref 36.0–46.0)
HEMOGLOBIN: 12.1 g/dL (ref 12.0–15.0)
LYMPHS PCT: 3 % — AB (ref 12–46)
Lymphs Abs: 0.8 10*3/uL (ref 0.7–4.0)
MCH: 34.3 pg — ABNORMAL HIGH (ref 26.0–34.0)
MCHC: 33.2 g/dL (ref 30.0–36.0)
MCV: 103.1 fL — ABNORMAL HIGH (ref 78.0–100.0)
Monocytes Absolute: 0.8 10*3/uL (ref 0.1–1.0)
Monocytes Relative: 3 % (ref 3–12)
Neutro Abs: 24.5 10*3/uL — ABNORMAL HIGH (ref 1.7–7.7)
Neutrophils Relative %: 94 % — ABNORMAL HIGH (ref 43–77)
PLATELETS: 239 10*3/uL (ref 150–400)
RBC: 3.53 MIL/uL — ABNORMAL LOW (ref 3.87–5.11)
RDW: 16.6 % — ABNORMAL HIGH (ref 11.5–15.5)
WBC: 26.1 10*3/uL — ABNORMAL HIGH (ref 4.0–10.5)

## 2014-03-16 LAB — URINALYSIS, ROUTINE W REFLEX MICROSCOPIC
Glucose, UA: NEGATIVE mg/dL
Ketones, ur: 15 mg/dL — AB
Leukocytes, UA: NEGATIVE
NITRITE: NEGATIVE
PH: 5 (ref 5.0–8.0)
Protein, ur: 100 mg/dL — AB
Specific Gravity, Urine: 1.026 (ref 1.005–1.030)
Urobilinogen, UA: 1 mg/dL (ref 0.0–1.0)

## 2014-03-16 LAB — URINE MICROSCOPIC-ADD ON

## 2014-03-16 LAB — CBG MONITORING, ED: GLUCOSE-CAPILLARY: 120 mg/dL — AB (ref 70–99)

## 2014-03-16 LAB — GLUCOSE, CAPILLARY
Glucose-Capillary: 141 mg/dL — ABNORMAL HIGH (ref 70–99)
Glucose-Capillary: 144 mg/dL — ABNORMAL HIGH (ref 70–99)
Glucose-Capillary: 114 mg/dL — ABNORMAL HIGH (ref 70–99)

## 2014-03-16 LAB — MRSA PCR SCREENING: MRSA by PCR: NEGATIVE

## 2014-03-16 LAB — I-STAT CG4 LACTIC ACID, ED: LACTIC ACID, VENOUS: 3.99 mmol/L — AB (ref 0.5–2.2)

## 2014-03-16 MED ORDER — LIDOCAINE HCL (PF) 1 % IJ SOLN
INTRAMUSCULAR | Status: AC
  Filled 2014-03-16: qty 5

## 2014-03-16 MED ORDER — VANCOMYCIN HCL IN DEXTROSE 1-5 GM/200ML-% IV SOLN
1000.0000 mg | INTRAVENOUS | Status: DC
  Administered 2014-03-17 – 2014-03-18 (×2): 1000 mg via INTRAVENOUS
  Filled 2014-03-16 (×2): qty 200

## 2014-03-16 MED ORDER — ACETAMINOPHEN 325 MG PO TABS: 650.0000 mg | ORAL_TABLET | Freq: Four times a day (QID) | ORAL | Status: DC | PRN

## 2014-03-16 MED ORDER — AMLODIPINE BESYLATE 5 MG PO TABS
5.0000 mg | ORAL_TABLET | Freq: Every day | ORAL | Status: DC
  Administered 2014-03-16: 5 mg via ORAL
  Filled 2014-03-16 (×2): qty 1

## 2014-03-16 MED ORDER — CLONIDINE HCL 0.3 MG PO TABS
0.3000 mg | ORAL_TABLET | Freq: Two times a day (BID) | ORAL | Status: DC
  Administered 2014-03-16 – 2014-03-20 (×9): 0.3 mg via ORAL
  Filled 2014-03-16 (×13): qty 1

## 2014-03-16 MED ORDER — ATORVASTATIN CALCIUM 40 MG PO TABS
40.0000 mg | ORAL_TABLET | Freq: Every day | ORAL | Status: DC
  Administered 2014-03-16 – 2014-03-20 (×5): 40 mg via ORAL
  Filled 2014-03-16 (×6): qty 1

## 2014-03-16 MED ORDER — SODIUM CHLORIDE 0.9 % IJ SOLN: 3.0000 mL | Freq: Two times a day (BID) | INTRAMUSCULAR | Status: DC

## 2014-03-16 MED ORDER — ONDANSETRON 4 MG PO TBDP
ORAL_TABLET | ORAL | Status: AC
  Filled 2014-03-16: qty 2

## 2014-03-16 MED ORDER — VANCOMYCIN HCL IN DEXTROSE 1-5 GM/200ML-% IV SOLN
1000.0000 mg | Freq: Once | INTRAVENOUS | Status: AC
  Administered 2014-03-16: 1000 mg via INTRAVENOUS
  Filled 2014-03-16: qty 200

## 2014-03-16 MED ORDER — SODIUM CHLORIDE 0.9 % IJ SOLN: 10.0000 mL | INTRAMUSCULAR | Status: DC | PRN

## 2014-03-16 MED ORDER — PIPERACILLIN-TAZOBACTAM 3.375 G IVPB
3.3750 g | Freq: Three times a day (TID) | INTRAVENOUS | Status: DC
  Administered 2014-03-16 – 2014-03-18 (×6): 3.375 g via INTRAVENOUS
  Filled 2014-03-16 (×8): qty 50

## 2014-03-16 MED ORDER — METOPROLOL TARTRATE 12.5 MG HALF TABLET
12.5000 mg | ORAL_TABLET | Freq: Two times a day (BID) | ORAL | Status: DC
  Administered 2014-03-16 (×2): 12.5 mg via ORAL
  Filled 2014-03-16 (×4): qty 1

## 2014-03-16 MED ORDER — SODIUM CHLORIDE 0.9 % IV SOLN
INTRAVENOUS | Status: DC
  Administered 2014-03-16 – 2014-03-17 (×2): via INTRAVENOUS
  Administered 2014-03-17: 1000 mL via INTRAVENOUS
  Administered 2014-03-17: 11:00:00 via INTRAVENOUS
  Administered 2014-03-18: 1000 mL via INTRAVENOUS

## 2014-03-16 MED ORDER — INSULIN ASPART 100 UNIT/ML ~~LOC~~ SOLN: 0.0000 [IU] | Freq: Every day | SUBCUTANEOUS | Status: DC

## 2014-03-16 MED ORDER — ONDANSETRON HCL 4 MG/2ML IJ SOLN
4.0000 mg | Freq: Once | INTRAMUSCULAR | Status: AC
  Administered 2014-03-16: 4 mg via INTRAVENOUS
  Filled 2014-03-16 (×2): qty 2

## 2014-03-16 MED ORDER — INSULIN ASPART 100 UNIT/ML ~~LOC~~ SOLN
0.0000 [IU] | Freq: Three times a day (TID) | SUBCUTANEOUS | Status: DC
  Administered 2014-03-16 – 2014-03-19 (×4): 2 [IU] via SUBCUTANEOUS

## 2014-03-16 MED ORDER — ACETAMINOPHEN 650 MG RE SUPP: 650.0000 mg | Freq: Four times a day (QID) | RECTAL | Status: DC | PRN

## 2014-03-16 MED ORDER — ONDANSETRON HCL 4 MG/2ML IJ SOLN: 4.0000 mg | Freq: Four times a day (QID) | INTRAMUSCULAR | Status: DC | PRN

## 2014-03-16 MED ORDER — SODIUM CHLORIDE 0.9 % IV BOLUS (SEPSIS)
1000.0000 mL | Freq: Once | INTRAVENOUS | Status: AC
  Administered 2014-03-16: 1000 mL via INTRAVENOUS

## 2014-03-16 MED ORDER — PROMETHAZINE HCL 25 MG/ML IJ SOLN
12.5000 mg | Freq: Once | INTRAMUSCULAR | Status: AC
  Administered 2014-03-16: 12.5 mg via INTRAVENOUS
  Filled 2014-03-16: qty 1

## 2014-03-16 MED ORDER — ONDANSETRON 4 MG PO TBDP
8.0000 mg | ORAL_TABLET | Freq: Once | ORAL | Status: AC
  Administered 2014-03-16: 8 mg via ORAL
  Filled 2014-03-16: qty 2

## 2014-03-16 MED ORDER — ONDANSETRON HCL 4 MG/2ML IJ SOLN
INTRAMUSCULAR | Status: AC
  Filled 2014-03-16: qty 2

## 2014-03-16 MED ORDER — IOHEXOL 300 MG/ML  SOLN: 25.0000 mL | INTRAMUSCULAR | Status: AC

## 2014-03-16 MED ORDER — ONDANSETRON 4 MG PO TBDP
8.0000 mg | ORAL_TABLET | Freq: Once | ORAL | Status: AC
  Administered 2014-03-16: 8 mg via ORAL

## 2014-03-16 MED ORDER — ASPIRIN EC 81 MG PO TBEC
81.0000 mg | DELAYED_RELEASE_TABLET | Freq: Every day | ORAL | Status: DC
  Administered 2014-03-16 – 2014-03-20 (×5): 81 mg via ORAL
  Filled 2014-03-16 (×7): qty 1

## 2014-03-16 MED ORDER — HEPARIN SODIUM (PORCINE) 5000 UNIT/ML IJ SOLN
5000.0000 [IU] | Freq: Three times a day (TID) | INTRAMUSCULAR | Status: DC
  Administered 2014-03-16 – 2014-03-20 (×14): 5000 [IU] via SUBCUTANEOUS
  Filled 2014-03-16 (×16): qty 1

## 2014-03-16 MED ORDER — PIPERACILLIN-TAZOBACTAM 3.375 G IVPB 30 MIN
3.3750 g | Freq: Once | INTRAVENOUS | Status: AC
  Administered 2014-03-16: 3.375 g via INTRAVENOUS
  Filled 2014-03-16: qty 50

## 2014-03-16 MED ORDER — MORPHINE SULFATE 2 MG/ML IJ SOLN: 2.0000 mg | INTRAMUSCULAR | Status: DC | PRN

## 2014-03-16 MED ORDER — ONDANSETRON HCL 4 MG/2ML IJ SOLN
4.0000 mg | INTRAMUSCULAR | Status: AC
  Administered 2014-03-16: 4 mg via INTRAVENOUS

## 2014-03-16 MED ORDER — PANTOPRAZOLE SODIUM 40 MG PO TBEC
40.0000 mg | DELAYED_RELEASE_TABLET | Freq: Every day | ORAL | Status: DC
  Administered 2014-03-16 – 2014-03-20 (×5): 40 mg via ORAL
  Filled 2014-03-16 (×5): qty 1

## 2014-03-16 MED ORDER — SODIUM CHLORIDE 0.9 % IJ SOLN
10.0000 mL | Freq: Two times a day (BID) | INTRAMUSCULAR | Status: DC
  Administered 2014-03-16 – 2014-03-20 (×7): 10 mL

## 2014-03-16 MED ORDER — ONDANSETRON HCL 4 MG PO TABS: 4.0000 mg | ORAL_TABLET | Freq: Four times a day (QID) | ORAL | Status: DC | PRN

## 2014-03-16 MED ORDER — CALCIUM CARBONATE ANTACID 500 MG PO CHEW
1.0000 | CHEWABLE_TABLET | Freq: Three times a day (TID) | ORAL | Status: DC | PRN
  Administered 2014-03-16: 200 mg via ORAL
  Filled 2014-03-16: qty 1

## 2014-03-16 NOTE — ED Notes (Signed)
Patient transported to CT 

## 2014-03-16 NOTE — ED Provider Notes (Signed)
CSN: [id number]     Arrival date & time 03/16/14  0105 History   First MD Initiated Contact with Patient 03/16/14 [phone]     Chief Complaint  Patient presents with  . Emesis     (Consider location/radiation/quality/duration/timing/severity/associated sxs/prior Treatment) HPI  This is a 68 year old female with history of hypertension, diabetes, history of rheumatoid arthritis on Methotrexate who presents with nausea and vomiting. Patient reports 2 day history of nausea and vomiting. She denies any abdominal pain. She denies any fevers. Patient states that she's been taking Bactrim for lower extremity cellulitis but has been unable to keep her medications down.  She denies any chest pain or shortness of breath. She denies any urinary symptoms.  Of note, last triage reviewed upon patient arrival to room approximately 3 hours later. Patient has a leukocytosis to 26. She remains tachycardic in the 130s. She is afebrile on admission.  Past Medical History  Diagnosis Date  . Hypertension   . Normal cardiac stress test     pt can't remember when or where  . Diabetes mellitus without complication     Type 2 NIDDM x 15 years  . Arthritis     osteoarthritis  . Hyperlipidemia   . Hormone replacement therapy (postmenopausal)   . Fatty liver disease, nonalcoholic 7169    hospitalized, MRCP dx fatty liver  . Rheumatoid arthritis(714.0) 09/28/2012  . Obesity, unspecified 12/27/2012  . GERD (gastroesophageal reflux disease)   . Chronic cholecystitis with calculus 05/04/2013  . Unspecified essential hypertension 05/04/2013  . Diabetes type 2, controlled 05/04/2013  . Bursitis of right shoulder    Past Surgical History  Procedure Laterality Date  . Joint replacement      left knee  . Abdominal hysterectomy    . Tonsillectomy    . Nasal sinus surgery    . Total knee arthroplasty  08/01/2012    Procedure: TOTAL KNEE ARTHROPLASTY;  Surgeon: Meredith Pel, MD;  Location: [facility];  Service:  Orthopedics;  Laterality: Right;  Right total knee arthroplasty  . Total knee arthroplasty  08/01/2012    RIGHT  KNEE  . Ercp N/A 05/02/2013    Procedure: ENDOSCOPIC RETROGRADE CHOLANGIOPANCREATOGRAPHY (ERCP);  Surgeon: Beryle Beams, MD;  Location: [facility];  Service: Endoscopy;  Laterality: N/A;  . Sphincterotomy  05/02/2013    Procedure: SPHINCTEROTOMY;  Surgeon: Beryle Beams, MD;  Location: [facility];  Service: Endoscopy;;  . Cholecystectomy N/A 05/04/2013    Procedure: LAPAROSCOPIC CHOLECYSTECTOMY WITH INTRAOPERATIVE CHOLANGIOGRAM;  Surgeon: Haywood Lasso, MD;  Location: [facility];  Service: General;  Laterality: N/A;   Family History  Problem Relation Age of Onset  . Diabetes Other   . Heart disease Other   . Cancer Mother     breast  . Heart attack Mother   . Stroke Father    History  Substance Use Topics  . Smoking status: Former Smoker    Types: Cigarettes  . Smokeless tobacco: Never Used     Comment: quit smoking regularly in her 30's; now has an occasional cigarette  . Alcohol Use: Yes     Comment: occasional   OB History   Grav Para Term Preterm Abortions TAB SAB Ect Mult Living                 Review of Systems  Constitutional: Positive for chills. Negative for fever.  Respiratory: Negative for cough, chest tightness and shortness of breath.   Cardiovascular: Negative for chest pain.  Gastrointestinal: Positive  for nausea and vomiting. Negative for abdominal pain and diarrhea.  Genitourinary: Negative for dysuria.  Musculoskeletal: Negative for back pain.  Skin: Positive for color change and wound.  Neurological: Negative for headaches.  Psychiatric/Behavioral: Negative for confusion.  All other systems reviewed and are negative.     Allergies  Monopril and Norvasc  Home Medications   Prior to Admission medications   Medication Sig Start Date End Date Taking? Authorizing Provider  amLODipine (NORVASC) 5 MG tablet Take 5 mg by mouth daily.    Yes Historical Provider, MD  aspirin EC 81 MG tablet Take 81 mg by mouth daily.   Yes Historical Provider, MD  atorvastatin (LIPITOR) 40 MG tablet Take 40 mg by mouth daily.   Yes Historical Provider, MD  Calcium Carbonate-Vitamin D (CALCIUM 600 + D PO) Take 1 tablet by mouth 2 (two) times daily.   Yes Historical Provider, MD  cloNIDine (CATAPRES) 0.3 MG tablet Take 1 tablet (0.3 mg total) by mouth 2 (two) times daily. 03/29/13  Yes Orlena Sheldon, PA-C  diclofenac sodium (VOLTAREN) 1 % GEL Apply 3 g topically daily as needed (pain). To 3 large joints   Yes Historical Provider, MD  estradiol (ESTRACE) 0.5 MG tablet Take 0.5 mg by mouth 2 (two) times daily.   Yes Historical Provider, MD  fish oil-omega-3 fatty acids 1000 MG capsule Take 1 g by mouth at bedtime.    Yes Historical Provider, MD  folic acid (FOLVITE) 1 MG tablet Take 1 tablet by mouth 2 (two) times daily.  12/08/12  Yes Historical Provider, MD  losartan-hydrochlorothiazide (HYZAAR) 100-12.5 MG per tablet Take 1 tablet by mouth daily.   Yes Historical Provider, MD  metFORMIN (GLUCOPHAGE) 1000 MG tablet Take 1,000 mg by mouth 2 (two) times daily with a meal.   Yes Historical Provider, MD  methotrexate (RHEUMATREX) 2.5 MG tablet Take 6 tablets by mouth once a week.  12/08/12  Yes Historical Provider, MD  metoprolol tartrate (LOPRESSOR) 25 MG tablet Take 12.5 mg by mouth 2 (two) times daily.   Yes Historical Provider, MD  Multiple Vitamins-Minerals (MULTIVITAMINS THER. W/MINERALS) TABS Take 1 tablet by mouth daily.   Yes Historical Provider, MD  omeprazole (PRILOSEC) 20 MG capsule Take 1 capsule (20 mg total) by mouth daily. 03/29/13  Yes Mary B Dixon, PA-C  sulfamethoxazole-trimethoprim (BACTRIM DS) 800-160 MG per tablet Take 1 tablet by mouth 2 (two) times daily. 03/04/14  Yes Mary B Dixon, PA-C   BP 152/80  Pulse 138  Temp(Src) 98.8 F (37.1 C) (Oral)  Resp 16  Ht 4\' 11"  (1.499 m)  Wt 140 lb (63.504 kg)  BMI 28.26 kg/m2  SpO2 97% Physical  Exam  Nursing note and vitals reviewed. Constitutional: She is oriented to person, place, and time. No distress.  Ill-appearing but nontoxic  HENT:  Head: Normocephalic and atraumatic.  Mucous membranes dry  Eyes: Pupils are equal, round, and reactive to light.  Neck: Neck supple.  Cardiovascular: Regular rhythm and normal heart sounds.   No murmur heard. Tachycardia  Pulmonary/Chest: Effort normal and breath sounds normal. No respiratory distress. She has no wheezes.  Abdominal: Soft. Bowel sounds are normal. There is tenderness. There is no rebound and no guarding.  Mild epigastric and right upper quadrant tenderness without rebound or per  Musculoskeletal: She exhibits no edema.  Neurological: She is alert and oriented to person, place, and time.  Skin: Skin is warm and dry.  2 well-healing scabs noted over the right foot one  over the dorsum of the foot and one over the lateral malleolus just over her tattoo., No associated redness or erythema, no swelling  Psychiatric: She has a normal mood and affect.    ED Course  CENTRAL LINE Date/Time: 03/16/2014 8:17 AM Performed by: Thayer Jew, F Authorized by: Thayer Jew, F Consent: written consent obtained. Risks and benefits: risks, benefits and alternatives were discussed Consent given by: patient Patient understanding: patient states understanding of the procedure being performed Required items: required blood products, implants, devices, and special equipment available Patient identity confirmed: verbally with patient Anesthesia: local infiltration Local anesthetic: lidocaine 1% with epinephrine Anesthetic total: 5 ml Patient sedated: no Preparation: skin prepped with 2% chlorhexidine Skin prep agent dried: skin prep agent completely dried prior to procedure Sterile barriers: all five maximum sterile barriers used - cap, mask, sterile gown, sterile gloves, and large sterile sheet Hand hygiene: hand hygiene performed  prior to central venous catheter insertion Location details: right femoral Site selection rationale: first attempt of IJ unsafe 2/2 extreme volume contraction; procedure aborted Patient position: flat Catheter type: triple lumen Pre-procedure: landmarks identified Ultrasound guidance: yes Number of attempts: 2 Successful placement: yes Post-procedure: dressing applied Assessment: blood return through all ports and free fluid flow Patient tolerance: Patient tolerated the procedure well with no immediate complications. Comments: Initial attempt of right IJ by ultrasound guidance. IJ very collapsible and just over the carotid. One attempt made and unable to pass guidewire secondary to collapse ability vessel.  Attempt aborted and right femoral line obtained without difficulty.   (including critical care time)  CRITICAL CARE Performed by: Thayer Jew, F   Total critical care time: 70 min  Critical care time was exclusive of separately billable procedures and treating other patients.  Critical care was necessary to treat or prevent imminent or life-threatening deterioration.  Critical care was time spent personally by me on the following activities: development of treatment plan with patient and/or surrogate as well as nursing, discussions with consultants, evaluation of patient's response to treatment, examination of patient, obtaining history from patient or surrogate, ordering and performing treatments and interventions, ordering and review of laboratory studies, ordering and review of radiographic studies, pulse oximetry and re-evaluation of patient's condition.  Labs Review Labs Reviewed  CBC WITH DIFFERENTIAL - Abnormal; Notable for the following:    WBC 26.1 (*)    RBC 3.53 (*)    MCV 103.1 (*)    MCH 34.3 (*)    RDW 16.6 (*)    Neutrophils Relative % 94 (*)    Lymphocytes Relative 3 (*)    Neutro Abs 24.5 (*)    All other components within normal limits  COMPREHENSIVE  METABOLIC PANEL - Abnormal; Notable for the following:    CO2 17 (*)    Glucose, Bld 118 (*)    BUN 31 (*)    Creatinine, Ser 1.32 (*)    Calcium 10.6 (*)    Total Protein 8.4 (*)    ALT 42 (*)    Alkaline Phosphatase 172 (*)    GFR calc non Af Amer 40 (*)    GFR calc Af Amer 47 (*)    Anion gap 27 (*)    All other components within normal limits  URINALYSIS, ROUTINE W REFLEX MICROSCOPIC - Abnormal; Notable for the following:    Color, Urine AMBER (*)    APPearance CLOUDY (*)    Hgb urine dipstick MODERATE (*)    Bilirubin Urine LARGE (*)    Ketones,  ur 15 (*)    Protein, ur 100 (*)    All other components within normal limits  URINE MICROSCOPIC-ADD ON - Abnormal; Notable for the following:    Bacteria, UA MANY (*)    Casts GRANULAR CAST (*)    All other components within normal limits  CBG MONITORING, ED - Abnormal; Notable for the following:    Glucose-Capillary 120 (*)    All other components within normal limits  I-STAT CG4 LACTIC ACID, ED - Abnormal; Notable for the following:    Lactic Acid, Venous 3.99 (*)    All other components within normal limits  URINE CULTURE  CULTURE, BLOOD (ROUTINE X 2)  CULTURE, BLOOD (ROUTINE X 2)    Imaging Review Dg Chest Portable 1 View  03/16/2014   CLINICAL DATA:  Emesis  EXAM: PORTABLE CHEST - 1 VIEW  COMPARISON:  Prior radiograph from 10/15/2013  FINDINGS: The cardiac and mediastinal silhouettes are stable in size and contour, and remain within normal limits.  The lungs are normally inflated. No airspace consolidation, pleural effusion, or pulmonary edema is identified. There is no pneumothorax.  No acute osseous abnormality identified. Degenerative osteoarthrosis noted about the left shoulder.  IMPRESSION: No acute cardiopulmonary abnormality.   Electronically Signed   By: Jeannine Boga M.D.   On: 03/16/2014 06:53     EKG Interpretation None      MDM   Final diagnoses:  Sepsis, due to unspecified organism  Acute kidney  injury  Dehydration    Patient presents with nausea and vomiting. While nontoxic-appearing exam, patient is ill-appearing with a heart rate in the 130s. It appears to be sinus rhythm. Lab work reviewed from triage are notable for leukocytosis and acute kidney injury. Patient has recent history of cellulitis that wound culture grew out MRSA.  Had been on Bactrim but has been unable to tolerate Bactrim for the last 2 days.  No signs of cellulitis at this time.  Of note, patient initially evaluated approximately 4:30 AM, 3 hours after arrival in triage.  Additional lab work ordered including blood cultures and lactate. Patient was given fluids. She was initially afebrile.  Repeat temperature 100.3. There was difficulty obtaining IV access. I placed 2 ultrasound-guided IVs that initially drew blood nicely but then infiltrated. Patient's fluids and antibiotics were delayed secondary to difficulty obtaining IV access. Vanc and Zosyn were ordered. Discussed with patient placement of central line to obtain needed access and resuscitate patient with fluids and antibiotics. Patient was consented and a right femoral line was placed. The ultrasound, the patient had very collapsible IJ and is likely very volume down.  Patient appear septic and dehydrated. Source unknown. No signs of cellulitis the recent wound culture positive for MRSA. Patient will be given vancomycin and Zosyn. She has remain tachycardic but blood pressures had remained stable.  Urinalysis with many bacteria but no overt infection. Chest x-ray is negative. We'll obtain CT scan abdomen given vomiting.     Merryl Hacker, MD 03/16/14 [phone]

## 2014-03-16 NOTE — Progress Notes (Signed)
03/16/2014 patient transfer from emergency room to 6700 at [facility]. She is alert, oriented and normally ambulatory. Patient stated she is a little weak. Crack area little excoriated, bruise on right upper arm, scabs on bilateral legs dry and no drainage. Tattoos on the feet. Patient had a central line on the left groin area and  She was receiving second bolus of fluid when arrive to unit. Patient was placed on contact when arrived on unit. [facility] RN.

## 2014-03-16 NOTE — Progress Notes (Signed)
03/16/2014 Patient saturation while sleeping continue to be 87 to 88 at 1800. Patient was placed on 2 Liters [place] at oxygen increase to 95 to 97. Rn notified Dr. Wyline Copas for prn order for oxygen. Continue to monitor. [facility] RN.

## 2014-03-16 NOTE — Progress Notes (Signed)
ANTIBIOTIC CONSULT NOTE - INITIAL  Pharmacy Consult for Vancomycin/Zosyn  Indication: rule out sepsis  Allergies  Allergen Reactions  . Monopril [Fosinopril] Cough  . Norvasc [Amlodipine Besylate]     10 mg dose causes LE edema.  Tolerates 5mg  dose.    Patient Measurements: Height: 4\' 11"  (149.9 cm) Weight: 140 lb (63.504 kg) IBW/kg (Calculated) : 43.2  Vital Signs: Temp: 100.3 F (37.9 C) (07/25 0641) Temp src: Rectal (07/25 0641) BP: 155/78 mmHg (07/25 0415) Pulse Rate: 130 (07/25 0415)  Labs:  Recent Labs  03/16/14 0113  WBC 26.1*  HGB 12.1  PLT 239  CREATININE 1.32*   Estimated Creatinine Clearance: 32.6 ml/min (by C-G formula based on Cr of 1.32).  Medical History: Past Medical History  Diagnosis Date  . Hypertension   . Normal cardiac stress test     pt can't remember when or where  . Diabetes mellitus without complication     Type 2 NIDDM x 15 years  . Arthritis     osteoarthritis  . Hyperlipidemia   . Hormone replacement therapy (postmenopausal)   . Fatty liver disease, nonalcoholic 8295    hospitalized, MRCP dx fatty liver  . Rheumatoid arthritis(714.0) 09/28/2012  . Obesity, unspecified 12/27/2012  . GERD (gastroesophageal reflux disease)   . Chronic cholecystitis with calculus 05/04/2013  . Unspecified essential hypertension 05/04/2013  . Diabetes type 2, controlled 05/04/2013  . Bursitis of right shoulder     Assessment: 68 y/o F here with N/V, apparently has multiple wound with MRSA (has been on bactrim), Leukocytosis present, lactic acid elevated, Scr 1.32, other labs as above.   Goal of Therapy:  Vancomycin trough level 15-20 mcg/ml  Plan:  -Vancomycin 1000 mg IV q24h -Zosyn 3.375G IV q8h to be infused over 4 hours -Trend WBC, temp, renal function  -Drug levels as indicated  -F/U cultures  Narda Bonds 03/16/2014,6:44 AM

## 2014-03-16 NOTE — Progress Notes (Signed)
Peripherally Inserted Central Catheter/Midline Placement  The IV Nurse has discussed with the patient and/or persons authorized to consent for the patient, the purpose of this procedure and the potential benefits and risks involved with this procedure.  The benefits include less needle sticks, lab draws from the catheter and patient may be discharged home with the catheter.  Risks include, but not limited to, infection, bleeding, blood clot (thrombus formation), and puncture of an artery; nerve damage and irregular heat beat.  Alternatives to this procedure were also discussed.  PICC/Midline Placement Documentation  PICC / Midline Double Lumen 91/47/82 PICC Right Basilic 35 cm 0 cm (Active)  Indication for Insertion or Continuance of Line Limited venous access - need for IV therapy >5 days (PICC only) 03/16/2014 11:26 AM  Exposed Catheter (cm) 0 cm 03/16/2014 11:26 AM  Site Assessment Clean;Dry;Intact 03/16/2014 11:26 AM  Lumen #1 Status Flushed;Saline locked;Blood return noted 03/16/2014 11:26 AM  Lumen #2 Status Flushed;Saline locked;Blood return noted 03/16/2014 11:26 AM  Dressing Type Transparent 03/16/2014 11:26 AM  Dressing Status Clean;Dry;Intact;Antimicrobial disc in place 03/16/2014 11:26 AM  Line Care Connections checked and tightened 03/16/2014 11:26 AM  Dressing Intervention New dressing 03/16/2014 11:26 AM  Dressing Change Due 03/23/14 03/16/2014 11:26 AM       Rolena Infante 03/16/2014, 11:28 AM

## 2014-03-16 NOTE — ED Notes (Signed)
Pt reports nausea, vomiting x 3 days. Had 4 wound spots cultured on 03/04/14 (bilateral feet, right ankle, and right lateral forearm) and was told that she had MRSA and was started on Bactrim DS for a 10 day regimen. Pt reports inability to keep meds, food, or liquids down x 3 days.

## 2014-03-16 NOTE — Progress Notes (Signed)
03/16/2014 Patient central line in right groin was removed at 1700. Pressure was held with unit 1720. Patient tolerated well. Site was monitor and no bleeding was noted, pressure dressing was placed and patient laid in bed for two hour after central line was pull. Continue to monitor. Third shift nurse was given report about central line.[facility] RN.

## 2014-03-16 NOTE — ED Notes (Signed)
Pt placed on contact precautions and family educated about contact precautions.

## 2014-03-16 NOTE — ED Notes (Signed)
Pt. reports persistent nausea/vomitting for 2 days , occasional diarrhea , denies fever or chills.

## 2014-03-16 NOTE — H&P (Signed)
[facility] History and Physical  [name] HER:[id number] DOB: 07-25-1946 DOA: 03/16/2014  Referring physician: Emergency Department PCP: Katherine Juba, PA-C  Specialists:   Chief Complaint: Nausea/vomiting  HPI: [name] is a 68 y.o. female  With a hx of diabetes, HTN, RA on methotrexate, HLD who initially presented to the ED with complaints of nausea/vomiting. In the Ed, pt found to have WBC of 26 with HR in 130-150's and lactate of just under 4. Pt was started on aggressive IVF and empiric vanc/zosyn. CXR was unremarkable and CT abd/pelvis was ordered, pending at the time of this dictation.  On further questioning, pt reports recently having multiple superficial draining skin lesions over R foot, and was later informed by her provider as being MRSA. Pt was given a course of bactrim. Denies abd pain, diarrhea, or constipation.   Review of Systems:  Per above, the remainder of the 10pt ros reviewed and are neg  Past Medical History  Diagnosis Date  . Hypertension   . Normal cardiac stress test     pt can't remember when or where  . Diabetes mellitus without complication     Type 2 NIDDM x 15 years  . Arthritis     osteoarthritis  . Hyperlipidemia   . Hormone replacement therapy (postmenopausal)   . Fatty liver disease, nonalcoholic 8563    hospitalized, MRCP dx fatty liver  . Rheumatoid arthritis(714.0) 09/28/2012  . Obesity, unspecified 12/27/2012  . GERD (gastroesophageal reflux disease)   . Chronic cholecystitis with calculus 05/04/2013  . Unspecified essential hypertension 05/04/2013  . Diabetes type 2, controlled 05/04/2013  . Bursitis of right shoulder    Past Surgical History  Procedure Laterality Date  . Joint replacement      left knee  . Abdominal hysterectomy    . Tonsillectomy    . Nasal sinus surgery    . Total knee arthroplasty  08/01/2012    Procedure: TOTAL KNEE ARTHROPLASTY;  Surgeon: Meredith Pel, MD;  Location: [place];  Service:  Orthopedics;  Laterality: Right;  Right total knee arthroplasty  . Total knee arthroplasty  08/01/2012    RIGHT  KNEE  . Ercp N/A 05/02/2013    Procedure: ENDOSCOPIC RETROGRADE CHOLANGIOPANCREATOGRAPHY (ERCP);  Surgeon: Beryle Beams, MD;  Location: [facility];  Service: Endoscopy;  Laterality: N/A;  . Sphincterotomy  05/02/2013    Procedure: SPHINCTEROTOMY;  Surgeon: Beryle Beams, MD;  Location: [facility];  Service: Endoscopy;;  . Cholecystectomy N/A 05/04/2013    Procedure: LAPAROSCOPIC CHOLECYSTECTOMY WITH INTRAOPERATIVE CHOLANGIOGRAM;  Surgeon: Haywood Lasso, MD;  Location: [facility];  Service: General;  Laterality: N/A;   Social History:  reports that she has quit smoking. Her smoking use included Cigarettes. She smoked 0.00 packs per day. She has never used smokeless tobacco. She reports that she drinks alcohol. She reports that she does not use illicit drugs.  where does patient live--home, ALF, SNF? and with whom if at home?  Can patient participate in ADLs?  Allergies  Allergen Reactions  . Monopril [Fosinopril] Cough  . Norvasc [Amlodipine Besylate]     10 mg dose causes LE edema.  Tolerates 53m dose.    Family History  Problem Relation Age of Onset  . Diabetes Other   . Heart disease Other   . Cancer Mother     breast  . Heart attack Mother   . Stroke Father     (be sure to complete)  Prior to Admission medications   Medication Sig Start  Date End Date Taking? Authorizing Provider  amLODipine (NORVASC) 5 MG tablet Take 5 mg by mouth daily.   Yes Historical Provider, MD  aspirin EC 81 MG tablet Take 81 mg by mouth daily.   Yes Historical Provider, MD  atorvastatin (LIPITOR) 40 MG tablet Take 40 mg by mouth daily.   Yes Historical Provider, MD  Calcium Carbonate-Vitamin D (CALCIUM 600 + D PO) Take 1 tablet by mouth 2 (two) times daily.   Yes Historical Provider, MD  cloNIDine (CATAPRES) 0.3 MG tablet Take 1 tablet (0.3 mg total) by mouth 2 (two) times daily.  03/29/13  Yes Orlena Sheldon, PA-C  diclofenac sodium (VOLTAREN) 1 % GEL Apply 3 g topically daily as needed (pain). To 3 large joints   Yes Historical Provider, MD  estradiol (ESTRACE) 0.5 MG tablet Take 0.5 mg by mouth 2 (two) times daily.   Yes Historical Provider, MD  fish oil-omega-3 fatty acids 1000 MG capsule Take 1 g by mouth at bedtime.    Yes Historical Provider, MD  folic acid (FOLVITE) 1 MG tablet Take 1 tablet by mouth 2 (two) times daily.  12/08/12  Yes Historical Provider, MD  losartan-hydrochlorothiazide (HYZAAR) 100-12.5 MG per tablet Take 1 tablet by mouth daily.   Yes Historical Provider, MD  metFORMIN (GLUCOPHAGE) 1000 MG tablet Take 1,000 mg by mouth 2 (two) times daily with a meal.   Yes Historical Provider, MD  methotrexate (RHEUMATREX) 2.5 MG tablet Take 6 tablets by mouth once a week.  12/08/12  Yes Historical Provider, MD  metoprolol tartrate (LOPRESSOR) 25 MG tablet Take 12.5 mg by mouth 2 (two) times daily.   Yes Historical Provider, MD  Multiple Vitamins-Minerals (MULTIVITAMINS THER. W/MINERALS) TABS Take 1 tablet by mouth daily.   Yes Historical Provider, MD  omeprazole (PRILOSEC) 20 MG capsule Take 1 capsule (20 mg total) by mouth daily. 03/29/13  Yes Mary B Dixon, PA-C  sulfamethoxazole-trimethoprim (BACTRIM DS) 800-160 MG per tablet Take 1 tablet by mouth 2 (two) times daily. 03/04/14  Yes Orlena Sheldon, PA-C   Physical Exam: Filed Vitals:   03/16/14 0745 03/16/14 0800 03/16/14 0815 03/16/14 0819  BP: 152/80 130/97 150/75   Pulse: 138 137 131   Temp:    98.8 F (37.1 C)  TempSrc:    Oral  Resp: _0 Height:      Weight:      SpO2: 97% 98% 99%      General:  Awake, in nad  Eyes: PERRL B  ENT: membranes moist, dentition fair  Neck: trachea midline, neck supple  Cardiovascular: regular, s1, s2  Respiratory: normal resp, no wheezing  Abdomen: soft, nondistended  Skin: perfused, no clubbing, multiple tattoos on feet, multiple small nodules over B feet  with central crusting and no active drainage and with small margin of erythema, not fluctuant or tender  Musculoskeletal: perfused, no clubbing, well healed B post-knee replacement scars  Psychiatric: mood/affect normal// no auditory/visual hallucinations  Neurologic: cn2-12 grossly intact, strength/sensation intact  Labs on Admission:  Basic Metabolic Panel:  Recent Labs Lab 03/16/14 0113  NA 141  K 4.2  CL 97  CO2 17*  GLUCOSE 118*  BUN 31*  CREATININE 1.32*  CALCIUM 10.6*   Liver Function Tests:  Recent Labs Lab 03/16/14 0113  AST 32  ALT 42*  ALKPHOS 172*  BILITOT 0.8  PROT 8.4*  ALBUMIN 4.1   No results found for this basename: LIPASE, AMYLASE,  in the last 168 hours  No results found for this basename: AMMONIA,  in the last 168 hours CBC:  Recent Labs Lab 03/16/14 0113  WBC 26.1*  NEUTROABS 24.5*  HGB 12.1  HCT 36.4  MCV 103.1*  PLT 239   Cardiac Enzymes: No results found for this basename: CKTOTAL, CKMB, CKMBINDEX, TROPONINI,  in the last 168 hours  BNP (last 3 results) No results found for this basename: PROBNP,  in the last 8760 hours CBG:  Recent Labs Lab 03/16/14 0113  GLUCAP 120*    Radiological Exams on Admission: Dg Chest Portable 1 View  03/16/2014   CLINICAL DATA:  Emesis  EXAM: PORTABLE CHEST - 1 VIEW  COMPARISON:  Prior radiograph from 10/15/2013  FINDINGS: The cardiac and mediastinal silhouettes are stable in size and contour, and remain within normal limits.  The lungs are normally inflated. No airspace consolidation, pleural effusion, or pulmonary edema is identified. There is no pneumothorax.  No acute osseous abnormality identified. Degenerative osteoarthrosis noted about the left shoulder.  IMPRESSION: No acute cardiopulmonary abnormality.   Electronically Signed   By: Jeannine Boga M.D.   On: 03/16/2014 06:53    EKG: Independently reviewed. Sinus Tach  Assessment/Plan Principal Problem:   Sepsis Active Problems:    Essential hypertension, benign   Obesity, unspecified   Diabetes type 2, controlled   1. SIRS 1. Tachycardic, febrile, leukocytosis with elevated lactate and mild renal insufficiency 2. Unclear source at this time 3. UA with no leukocytes or nitrites but has many bacteria, pan cultures pending 4. Questionable skin lesions mentioned above not draining and without frank erythema or tenderness 5. CT abd/pelvis pending 6. For now, will continue empiric broad coverage with vanc/zoysn 7. Cont aggressive volume resuscitation 8. Will order PICC for access, as pt currently has groin central line for emergent access 2. HTN 1. Presently stable 2. Cont on home BP meds for now, unless pt develops shock secondary to above 3. DM 2 1. Will cont on SSI coverage 4. Nausea/vomiting 1. CT abd/pelvis ordered through ED and is pending 2. Cont supportive care for now 3. Of note, alk phos appears to be mildly elevated - f/u CT abd 5. Rheumatoid arthritis 1. Will hold methotrexate 2. Cont analgesics as tolerated 6. DVT prophylaxis 1. Heparin subQ 7. Sinus tach 1. Likely secondary to SIRS above 2. Volume resuscitate  8. Leukocytosis 1. Likely secondary to above 2. Follow cultures  Code Status: Full (must indicate code status--if unknown or must be presumed, indicate so) Family Communication: Pt in room (indicate person spoken with, if applicable, with phone number if by telephone) Disposition Plan: Pending stabilization of patient (indicate anticipated LOS)  Time spent: 69mn  [facility], [facility] Pager [phone] If 7PM-7AM, please contact night-coverage [email or web address] Password [email or web address]7/25/2015, 8:32 AM

## 2014-03-16 NOTE — ED Notes (Signed)
Admitting MD is at bedside. ED MD reporting she can go to CT without having drank the rest of her contrast. CT made aware.

## 2014-03-16 NOTE — ED Notes (Signed)
Dr. Dina Rich attempting femoral central line stick. Previous right IJ attempt, unsuccessful. Pt is a x 4. Tolerating well.

## 2014-03-17 ENCOUNTER — Encounter (HOSPITAL_COMMUNITY): Payer: Self-pay | Admitting: Radiology

## 2014-03-17 ENCOUNTER — Inpatient Hospital Stay (HOSPITAL_COMMUNITY): Payer: BC Managed Care – PPO

## 2014-03-17 DIAGNOSIS — E860 Dehydration: Secondary | ICD-10-CM

## 2014-03-17 DIAGNOSIS — E119 Type 2 diabetes mellitus without complications: Secondary | ICD-10-CM

## 2014-03-17 DIAGNOSIS — E669 Obesity, unspecified: Secondary | ICD-10-CM

## 2014-03-17 DIAGNOSIS — M069 Rheumatoid arthritis, unspecified: Secondary | ICD-10-CM

## 2014-03-17 DIAGNOSIS — A419 Sepsis, unspecified organism: Principal | ICD-10-CM

## 2014-03-17 LAB — COMPREHENSIVE METABOLIC PANEL
ALBUMIN: 2.2 g/dL — AB (ref 3.5–5.2)
ALT: 24 U/L (ref 0–35)
ANION GAP: 13 (ref 5–15)
AST: 25 U/L (ref 0–37)
Alkaline Phosphatase: 82 U/L (ref 39–117)
BUN: 32 mg/dL — AB (ref 6–23)
CALCIUM: 7.7 mg/dL — AB (ref 8.4–10.5)
CO2: 20 mEq/L (ref 19–32)
Chloride: 108 mEq/L (ref 96–112)
Creatinine, Ser: 1.11 mg/dL — ABNORMAL HIGH (ref 0.50–1.10)
GFR calc Af Amer: 57 mL/min — ABNORMAL LOW (ref >90)
GFR calc non Af Amer: 49 mL/min — ABNORMAL LOW (ref >90)
Glucose, Bld: 115 mg/dL — ABNORMAL HIGH (ref 70–99)
Potassium: 4 mEq/L (ref 3.7–5.3)
Sodium: 141 mEq/L (ref 137–147)
TOTAL PROTEIN: 4.9 g/dL — AB (ref 6.0–8.3)
Total Bilirubin: <0.2 mg/dL — ABNORMAL LOW (ref 0.3–1.2)

## 2014-03-17 LAB — URINE CULTURE
COLONY COUNT: NO GROWTH
Culture: NO GROWTH

## 2014-03-17 LAB — GLUCOSE, CAPILLARY
GLUCOSE-CAPILLARY: 123 mg/dL — AB (ref 70–99)
GLUCOSE-CAPILLARY: 103 mg/dL — AB (ref 70–99)
GLUCOSE-CAPILLARY: 110 mg/dL — AB (ref 70–99)
GLUCOSE-CAPILLARY: 111 mg/dL — AB (ref 70–99)

## 2014-03-17 LAB — CBC
HEMATOCRIT: 24.8 % — AB (ref 36.0–46.0)
HEMOGLOBIN: 8.1 g/dL — AB (ref 12.0–15.0)
MCH: 34.9 pg — ABNORMAL HIGH (ref 26.0–34.0)
MCHC: 32.7 g/dL (ref 30.0–36.0)
MCV: 106.9 fL — ABNORMAL HIGH (ref 78.0–100.0)
Platelets: 157 10*3/uL (ref 150–400)
RBC: 2.32 MIL/uL — AB (ref 3.87–5.11)
RDW: 16.9 % — ABNORMAL HIGH (ref 11.5–15.5)
WBC: 12.1 10*3/uL — AB (ref 4.0–10.5)

## 2014-03-17 MED ORDER — IOHEXOL 300 MG/ML  SOLN
100.0000 mL | Freq: Once | INTRAMUSCULAR | Status: AC | PRN
  Administered 2014-03-17: 80 mL via INTRAVENOUS

## 2014-03-17 NOTE — Progress Notes (Signed)
[facility] PROGRESS NOTE  [name] HCW:[id number] DOB: [birth year] DOA: 03/16/2014 PCP: Dena Billet BETH, PA-C  Assessment/Plan: 1. SIRS  1. Presenting tachycardia, febrile, leukocytosis with elevated lactate and mild renal insufficiency 2. Source remains unclear at this time 3. UA with no leukocytes or nitrites but has many bacteria, pan cultures pending 4. CT abd/pelvis without acute process 5. Will continue empiric broad coverage with vanc/zoysn 6. Cont aggressive as tolerated 7. WBC trending down, however, question dilutional effects, as WBC, HGB, and Plts are trending down in the setting of aggressive fluids 8. HR has improved with IVF 2. HTN  1. Borderline low this AM 2. Will hold bp meds except for clonidine to prevent rebound HTN 3. DM 2  1. Will cont on SSI coverage 4. Nausea/vomiting  1. CT abd/pelvis without acute process 2. Cont supportive care for now 5. Rheumatoid arthritis  1. Will hold methotrexate 2. Cont analgesics as tolerated 6. DVT prophylaxis  1. Heparin subQ 7. Sinus tach  1. Likely secondary to SIRS above 2. Volume resuscitate  8. Leukocytosis  1. Likely secondary to above 2. Follow cultures 9. Lung nodule 1. 60mm R lung nodule seen on abd CT 2. Will order dedicated chest CT 3. CXR without acute process  Code Status: Full Family Communication: Pt in room Disposition Plan: Pending   Consultants:    Procedures:    Antibiotics:  Vanc 7/25>>>  Zosyn 7/25>>>  HPI/Subjective: No complaints. Feels much better today.  Objective: Filed Vitals:   03/17/14 0900 03/17/14 0923 03/17/14 1100 03/17/14 1226  BP:  97/52  100/56  Pulse: 79 71  103  Temp:    97.8 F (36.6 C)  TempSrc:    Oral  Resp: 15 16 20 16   Height:      Weight:      SpO2: 100% 99%  100%    Intake/Output Summary (Last 24 hours) at 03/17/14 1407 Last data filed at 03/17/14 0815  Gross per 24 hour  Intake 2697.5 ml  Output    825 ml  Net 1872.5 ml   Filed  Weights   03/16/14 0115 03/16/14 0944  Weight: 63.504 kg (140 lb) 61.4 kg (135 lb 5.8 oz)    Exam:   General:  Awake, in nad  Cardiovascular: regular, s1, s2  Respiratory: normal resp effort, no wheezing  Abdomen: soft,nondistended  Musculoskeletal: perfused, no clubbing   Data Reviewed: Basic Metabolic Panel:  Recent Labs Lab 03/16/14 0113 03/17/14 0450  NA 141 141  K 4.2 4.0  CL 97 108  CO2 17* 20  GLUCOSE 118* 115*  BUN 31* 32*  CREATININE 1.32* 1.11*  CALCIUM 10.6* 7.7*   Liver Function Tests:  Recent Labs Lab 03/16/14 0113 03/17/14 0450  AST 32 25  ALT 42* 24  ALKPHOS 172* 82  BILITOT 0.8 <0.2*  PROT 8.4* 4.9*  ALBUMIN 4.1 2.2*   No results found for this basename: LIPASE, AMYLASE,  in the last 168 hours No results found for this basename: AMMONIA,  in the last 168 hours CBC:  Recent Labs Lab 03/16/14 0113 03/17/14 0450  WBC 26.1* 12.1*  NEUTROABS 24.5*  --   HGB 12.1 8.1*  HCT 36.4 24.8*  MCV 103.1* 106.9*  PLT 239 157   Cardiac Enzymes: No results found for this basename: CKTOTAL, CKMB, CKMBINDEX, TROPONINI,  in the last 168 hours BNP (last 3 results) No results found for this basename: PROBNP,  in the last 8760 hours CBG:  Recent Labs Lab 03/16/14 1141 03/16/14  1624 03/16/14 2131 03/17/14 0810 03/17/14 1135  GLUCAP 141* 144* 114* 123* 103*    Recent Results (from the past 240 hour(s))  CULTURE, BLOOD (ROUTINE X 2)     Status: None   Collection Time    03/16/14  6:19 AM      Result Value Ref Range Status   Specimen Description BLOOD LEFT ARM   Final   Special Requests BOTTLES DRAWN AEROBIC AND ANAEROBIC 5CC   Final   Culture  Setup Time     Final   Value: 03/16/2014 14:06     Performed at [facility]   Culture     Final   Value:        BLOOD CULTURE RECEIVED NO GROWTH TO DATE CULTURE WILL BE HELD FOR 5 DAYS BEFORE ISSUING A FINAL NEGATIVE REPORT     Performed at [facility]   Report Status PENDING    Incomplete  CULTURE, BLOOD (ROUTINE X 2)     Status: None   Collection Time    03/16/14  6:55 AM      Result Value Ref Range Status   Specimen Description BLOOD RIGHT ARM   Final   Special Requests BOTTLES DRAWN AEROBIC AND ANAEROBIC 10CC   Final   Culture  Setup Time     Final   Value: 03/16/2014 14:06     Performed at [facility]   Culture     Final   Value:        BLOOD CULTURE RECEIVED NO GROWTH TO DATE CULTURE WILL BE HELD FOR 5 DAYS BEFORE ISSUING A FINAL NEGATIVE REPORT     Performed at [facility]   Report Status PENDING   Incomplete  MRSA PCR SCREENING     Status: None   Collection Time    03/16/14 11:12 AM      Result Value Ref Range Status   MRSA by PCR NEGATIVE  NEGATIVE Final   Comment:            The GeneXpert MRSA Assay (FDA     approved for NASAL specimens     only), is one component of a     comprehensive MRSA colonization     surveillance program. It is not     intended to diagnose MRSA     infection nor to guide or     monitor treatment for     MRSA infections.     Studies: Ct Abdomen Pelvis Wo Contrast  03/16/2014   CLINICAL DATA:  EMESIS  EXAM: CT ABDOMEN AND PELVIS WITHOUT CONTRAST  TECHNIQUE: Multidetector CT imaging of the abdomen and pelvis was performed following the standard protocol without IV contrast.  COMPARISON:  10/16/2013  FINDINGS: 7 mm right middle lobe nodule suspected on the first cut of study. Visualized lung bases otherwise unremarkable. Small hiatal hernia. Moderate aortoiliac and coronary arterial calcifications. Surgical clips in the gallbladder fossa. Unremarkable liver, spleen, adrenal glands. Mild diffuse pancreatic parenchymal atrophy. Bilateral renal cysts, largest from the upper pole right kidney 6.9 cm, on the left from the upper pole 15 mm. No hydronephrosis. 2 mm parenchymal calcification in mid left kidney stable. Stomach, small bowel, colon are nondilated. Appendix not discretely identified. Right femoral venous  catheter partially seen. Urinary bladder incompletely distended. Previous hysterectomy. Bilateral pelvic phleboliths. No ascites. No free air. No adenopathy localized. Lumbar dextroscoliosis apex L2-3 with multilevel spondylitic changes.  IMPRESSION: 1. No acute abdominal process. 2. Right middle lobe pulmonary nodule  suspected. Consider elective CT chest for further characterization. 3. Atherosclerosis, including aortoiliac and coronary artery disease. Please note that although the presence of coronary artery calcium documents the presence of coronary artery disease, the severity of this disease and any potential stenosis cannot be assessed on this non-gated CT examination. Assessment for potential risk factor modification, dietary therapy or pharmacologic therapy may be warranted, if clinically indicated. 4. Small hiatal hernia.   Electronically Signed   By: Arne Cleveland M.D.   On: 03/16/2014 09:47   Dg Chest Port 1 View  03/16/2014   CLINICAL DATA:  PICC line placement.  EXAM: PORTABLE CHEST - 1 VIEW  COMPARISON:  03/16/2014.  FINDINGS: The right PICC line tip is at the level of the carina, approximately 4 cm above the cavoatrial junction. No complicating features. The heart and lungs are normal and stable.  IMPRESSION: Right PICC line tip is in the mid SVC.   Electronically Signed   By: Kalman Jewels M.D.   On: 03/16/2014 12:57   Dg Chest Portable 1 View  03/16/2014   CLINICAL DATA:  Emesis  EXAM: PORTABLE CHEST - 1 VIEW  COMPARISON:  Prior radiograph from 10/15/2013  FINDINGS: The cardiac and mediastinal silhouettes are stable in size and contour, and remain within normal limits.  The lungs are normally inflated. No airspace consolidation, pleural effusion, or pulmonary edema is identified. There is no pneumothorax.  No acute osseous abnormality identified. Degenerative osteoarthrosis noted about the left shoulder.  IMPRESSION: No acute cardiopulmonary abnormality.   Electronically Signed   By:  Jeannine Boga M.D.   On: 03/16/2014 06:53    Scheduled Meds: . aspirin EC  81 mg Oral Daily  . atorvastatin  40 mg Oral Daily  . cloNIDine  0.3 mg Oral BID  . heparin  5,000 Units Subcutaneous 3 times per day  . insulin aspart  0-15 Units Subcutaneous TID WC  . insulin aspart  0-5 Units Subcutaneous QHS  . pantoprazole  40 mg Oral Daily  . piperacillin-tazobactam (ZOSYN)  IV  3.375 g Intravenous 3 times per day  . sodium chloride  10-40 mL Intracatheter Q12H  . sodium chloride  3 mL Intravenous Q12H  . vancomycin  1,000 mg Intravenous Q24H   Continuous Infusions: . sodium chloride 100 mL/hr at 03/17/14 1038    Principal Problem:   Sepsis Active Problems:   Essential hypertension, benign   Rheumatoid arthritis(714.0)   Obesity, unspecified   Diabetes type 2, controlled  Time spent: 58min  CHIU, [facility] Pager [phone]. If 7PM-7AM, please contact night-coverage at [email or web address], password [facility] 03/17/2014, 2:07 PM  LOS: 1 day

## 2014-03-17 NOTE — Progress Notes (Signed)
03/17/2014 Patient had her pneumonia  vaccine 08/02/2012 and on 07/04/2013. [facility] RN.

## 2014-03-18 DIAGNOSIS — I10 Essential (primary) hypertension: Secondary | ICD-10-CM

## 2014-03-18 DIAGNOSIS — E440 Moderate protein-calorie malnutrition: Secondary | ICD-10-CM

## 2014-03-18 DIAGNOSIS — N179 Acute kidney failure, unspecified: Secondary | ICD-10-CM

## 2014-03-18 HISTORY — DX: Moderate protein-calorie malnutrition: E44.0

## 2014-03-18 LAB — COMPREHENSIVE METABOLIC PANEL
ALBUMIN: 2.1 g/dL — AB (ref 3.5–5.2)
ALK PHOS: 144 U/L — AB (ref 39–117)
ALT: 31 U/L (ref 0–35)
AST: 36 U/L (ref 0–37)
Anion gap: 10 (ref 5–15)
BILIRUBIN TOTAL: 0.3 mg/dL (ref 0.3–1.2)
BUN: 21 mg/dL (ref 6–23)
CHLORIDE: 112 meq/L (ref 96–112)
CO2: 20 mEq/L (ref 19–32)
Calcium: 7.5 mg/dL — ABNORMAL LOW (ref 8.4–10.5)
Creatinine, Ser: 0.83 mg/dL (ref 0.50–1.10)
GFR calc Af Amer: 82 mL/min — ABNORMAL LOW (ref >90)
GFR calc non Af Amer: 70 mL/min — ABNORMAL LOW (ref >90)
Glucose, Bld: 92 mg/dL (ref 70–99)
POTASSIUM: 4.7 meq/L (ref 3.7–5.3)
SODIUM: 142 meq/L (ref 137–147)
Total Protein: 4.7 g/dL — ABNORMAL LOW (ref 6.0–8.3)

## 2014-03-18 LAB — CBC
HCT: 23.9 % — ABNORMAL LOW (ref 36.0–46.0)
HCT: 24.1 % — ABNORMAL LOW (ref 36.0–46.0)
Hemoglobin: 7.7 g/dL — ABNORMAL LOW (ref 12.0–15.0)
Hemoglobin: 7.8 g/dL — ABNORMAL LOW (ref 12.0–15.0)
MCH: 34.4 pg — ABNORMAL HIGH (ref 26.0–34.0)
MCH: 34.5 pg — ABNORMAL HIGH (ref 26.0–34.0)
MCHC: 32.2 g/dL (ref 30.0–36.0)
MCHC: 32.4 g/dL (ref 30.0–36.0)
MCV: 106.7 fL — ABNORMAL HIGH (ref 78.0–100.0)
MCV: 106.6 fL — ABNORMAL HIGH (ref 78.0–100.0)
Platelets: 186 10*3/uL (ref 150–400)
Platelets: 199 10*3/uL (ref 150–400)
RBC: 2.24 MIL/uL — ABNORMAL LOW (ref 3.87–5.11)
RBC: 2.26 MIL/uL — ABNORMAL LOW (ref 3.87–5.11)
RDW: 16.9 % — AB (ref 11.5–15.5)
RDW: 16.8 % — ABNORMAL HIGH (ref 11.5–15.5)
WBC: 6.6 10*3/uL (ref 4.0–10.5)
WBC: 5.7 10*3/uL (ref 4.0–10.5)

## 2014-03-18 LAB — GLUCOSE, CAPILLARY
Glucose-Capillary: 100 mg/dL — ABNORMAL HIGH (ref 70–99)
Glucose-Capillary: 109 mg/dL — ABNORMAL HIGH (ref 70–99)
Glucose-Capillary: 106 mg/dL — ABNORMAL HIGH (ref 70–99)
Glucose-Capillary: 89 mg/dL (ref 70–99)

## 2014-03-18 LAB — VITAMIN B12: Vitamin B-12: 221 pg/mL (ref 211–911)

## 2014-03-18 MED ORDER — DEXTROSE 5 % IV SOLN
1.0000 g | INTRAVENOUS | Status: DC
  Administered 2014-03-18 – 2014-03-20 (×3): 1 g via INTRAVENOUS
  Filled 2014-03-18 (×4): qty 10

## 2014-03-18 MED ORDER — BISACODYL 10 MG RE SUPP: 10.0000 mg | Freq: Every day | RECTAL | Status: DC | PRN

## 2014-03-18 MED ORDER — POLYETHYLENE GLYCOL 3350 17 G PO PACK
17.0000 g | PACK | Freq: Every day | ORAL | Status: DC
  Administered 2014-03-18 – 2014-03-19 (×2): 17 g via ORAL
  Filled 2014-03-18 (×4): qty 1

## 2014-03-18 NOTE — Progress Notes (Signed)
[facility] PROGRESS NOTE  [name] CZY:[id number] DOB: [birth year] DOA: 03/16/2014 PCP: Dena Billet BETH, PA-C  Assessment/Plan: 1. SIRS vs possible Sepsis with UTI 1. Presenting tachycardia, febrile, leukocytosis with elevated lactate and mild renal insufficiency 2. Source remains unclear at this time 3. UA with no leukocytes or nitrites but has many bacteria, pan cultures neg thus far 4. CT abd/pelvis without acute process 5. On empiric broad coverage with vanc/zoysn 6. WBC trending down, however, question dilutional effects, as WBC, HGB, and Plts are trending down in the setting of aggressive fluids 7. HR has improved with IVF 8. Consider transition to rocephin for possible UTI 2. HTN  1. Holding bp meds except for clonidine to prevent rebound HTN 3. DM 2  1. Will cont on SSI coverage 4. Nausea/vomiting  1. CT abd/pelvis without acute process 2. Cont supportive care for now 5. Rheumatoid arthritis  1. Will hold methotrexate 2. Cont analgesics as tolerated 6. DVT prophylaxis  1. Heparin subQ 7. Sinus tach  1. Likely secondary to SIRS/sepsis above 2. Resolved 8. Leukocytosis  1. Likely secondary to above 2. Follow cultures 9. Lung nodule 1. 80mm R lung nodule seen on abd CT 2. Follow up chest CT with 74mm nodule with recs for follow up CT in 12 mos 3. CXR without acute process  Code Status: Full Family Communication: Pt in room Disposition Plan: Pending  Consultants:    Procedures:    Antibiotics:  Vanc 7/25>>>  Zosyn 7/25>>>  HPI/Subjective: No complaints. No acute events noted overnight  Objective: Filed Vitals:   03/18/14 0400 03/18/14 0640 03/18/14 0812 03/18/14 1008  BP: 114/47 114/46 107/43 124/66  Pulse: 65 56 60   Temp: 96.9 F (36.1 C)  97.7 F (36.5 C)   TempSrc: Oral  Oral   Resp: 15 12 13    Height:      Weight:      SpO2: 100% 96% 96%     Intake/Output Summary (Last 24 hours) at 03/18/14 1238 Last data filed at 03/18/14  1008  Gross per 24 hour  Intake 2775.83 ml  Output    600 ml  Net 2175.83 ml   Filed Weights   03/16/14 0115 03/16/14 0944  Weight: 63.504 kg (140 lb) 61.4 kg (135 lb 5.8 oz)    Exam:   General:  Awake, in nad  Cardiovascular: regular, s1, s2  Respiratory: normal resp effort, no wheezing  Abdomen: soft,nondistended  Musculoskeletal: perfused, no clubbing   Data Reviewed: Basic Metabolic Panel:  Recent Labs Lab 03/16/14 0113 03/17/14 0450 03/18/14 0800  NA 141 141 142  K 4.2 4.0 4.7  CL 97 108 112  CO2 17* 20 20  GLUCOSE 118* 115* 92  BUN 31* 32* 21  CREATININE 1.32* 1.11* 0.83  CALCIUM 10.6* 7.7* 7.5*   Liver Function Tests:  Recent Labs Lab 03/16/14 0113 03/17/14 0450 03/18/14 0800  AST 32 25 36  ALT 42* 24 31  ALKPHOS 172* 82 144*  BILITOT 0.8 <0.2* 0.3  PROT 8.4* 4.9* 4.7*  ALBUMIN 4.1 2.2* 2.1*   No results found for this basename: LIPASE, AMYLASE,  in the last 168 hours No results found for this basename: AMMONIA,  in the last 168 hours CBC:  Recent Labs Lab 03/16/14 0113 03/17/14 0450 03/18/14 0800  WBC 26.1* 12.1* 6.6  NEUTROABS 24.5*  --   --   HGB 12.1 8.1* 7.7*  HCT 36.4 24.8* 23.9*  MCV 103.1* 106.9* 106.7*  PLT 239 157 186   Cardiac  Enzymes: No results found for this basename: CKTOTAL, CKMB, CKMBINDEX, TROPONINI,  in the last 168 hours BNP (last 3 results) No results found for this basename: PROBNP,  in the last 8760 hours CBG:  Recent Labs Lab 03/17/14 0810 03/17/14 1135 03/17/14 1531 03/17/14 2205 03/18/14 0813  GLUCAP 123* 103* 110* 111* 100*    Recent Results (from the past 240 hour(s))  CULTURE, BLOOD (ROUTINE X 2)     Status: None   Collection Time    03/16/14  6:19 AM      Result Value Ref Range Status   Specimen Description BLOOD LEFT ARM   Final   Special Requests BOTTLES DRAWN AEROBIC AND ANAEROBIC 5CC   Final   Culture  Setup Time     Final   Value: 03/16/2014 14:06     Performed at [facility]   Culture     Final   Value:        BLOOD CULTURE RECEIVED NO GROWTH TO DATE CULTURE WILL BE HELD FOR 5 DAYS BEFORE ISSUING A FINAL NEGATIVE REPORT     Performed at [facility]   Report Status PENDING   Incomplete  URINE CULTURE     Status: None   Collection Time    03/16/14  6:28 AM      Result Value Ref Range Status   Specimen Description URINE, CATHETERIZED   Final   Special Requests NONE   Final   Culture  Setup Time     Final   Value: 03/16/2014 19:21     Performed at [place]     Final   Value: NO GROWTH     Performed at [facility]   Culture     Final   Value: NO GROWTH     Performed at [facility]   Report Status 03/17/2014 FINAL   Final  CULTURE, BLOOD (ROUTINE X 2)     Status: None   Collection Time    03/16/14  6:55 AM      Result Value Ref Range Status   Specimen Description BLOOD RIGHT ARM   Final   Special Requests BOTTLES DRAWN AEROBIC AND ANAEROBIC 10CC   Final   Culture  Setup Time     Final   Value: 03/16/2014 14:06     Performed at [facility]   Culture     Final   Value:        BLOOD CULTURE RECEIVED NO GROWTH TO DATE CULTURE WILL BE HELD FOR 5 DAYS BEFORE ISSUING A FINAL NEGATIVE REPORT     Performed at [facility]   Report Status PENDING   Incomplete  MRSA PCR SCREENING     Status: None   Collection Time    03/16/14 11:12 AM      Result Value Ref Range Status   MRSA by PCR NEGATIVE  NEGATIVE Final   Comment:            The GeneXpert MRSA Assay (FDA     approved for NASAL specimens     only), is one component of a     comprehensive MRSA colonization     surveillance program. It is not     intended to diagnose MRSA     infection nor to guide or     monitor treatment for     MRSA infections.     Studies: Ct Chest W Contrast  03/18/2014  CLINICAL DATA:  Right lung mass.  EXAM: CT CHEST WITH CONTRAST  TECHNIQUE: Multidetector CT imaging of the chest was performed  during intravenous contrast administration.  CONTRAST:  13mL OMNIPAQUE IOHEXOL 300 MG/ML  SOLN  COMPARISON:  CT scan abdomen dated 03/16/2014  FINDINGS: There is an 8 x 8 x 9 mm pulmonary nodule in the right middle lobe. No hilar or mediastinal adenopathy. There is a 4 mm nodule in the left upper lobe best seen on image 66 of series 203. There is a possible tiny nodule, 3 mm, in the right upper lobe on image 26 of series 202. This could represent a branching vessel however.  There is slight pleural thickening and loculated fluid at the left lung base. Heart size is normal. The patient has dense extensive coronary artery calcification.  No acute osseous abnormalities. PICC line in the superior vena cava.  IMPRESSION: 9 mm nodule in the right middle lobe. The possibility of a primary carcinoma lung should be considered. 4 mm nodule in the left upper lobe could be followed with unenhanced CT scan in 12 months. The possible nodule in the right upper lobe could be assessed at the same time.   Electronically Signed   By: Rozetta Nunnery M.D.   On: 03/18/2014 02:12    Scheduled Meds: . aspirin EC  81 mg Oral Daily  . atorvastatin  40 mg Oral Daily  . cefTRIAXone (ROCEPHIN)  IV  1 g Intravenous Q24H  . cloNIDine  0.3 mg Oral BID  . heparin  5,000 Units Subcutaneous 3 times per day  . insulin aspart  0-15 Units Subcutaneous TID WC  . insulin aspart  0-5 Units Subcutaneous QHS  . pantoprazole  40 mg Oral Daily  . sodium chloride  10-40 mL Intracatheter Q12H   Continuous Infusions:    Principal Problem:   Sepsis Active Problems:   Essential hypertension, benign   Rheumatoid arthritis(714.0)   Obesity, unspecified   Diabetes type 2, controlled  Time spent: 67min  [facility], [place] [facility] Pager [phone]. If 7PM-7AM, please contact night-coverage at [email or web address], password [facility] 03/18/2014, 12:38 PM  LOS: 2 days

## 2014-03-18 NOTE — Progress Notes (Signed)
Nurse took pt to [facility] telemetry aware.

## 2014-03-18 NOTE — Progress Notes (Signed)
ATTEMPTED TO CALL REPORT ON PT TO [facility] NURSE. SECRETARY STATED NURSE WOULD CALL BACK AS SHE WAS BUSY WITH A PATIENT AT THIS TIME.

## 2014-03-18 NOTE — Progress Notes (Signed)
NURSE FROM [facility] CALLED. GAVE REPORT.

## 2014-03-18 NOTE — Progress Notes (Signed)
INITIAL NUTRITION ASSESSMENT  DOCUMENTATION CODES Per approved criteria  -Non-severe (moderate) malnutrition in the context of chronic illness   INTERVENTION: No nutrition intervention at this time --- patient declined RD to follow for nutrition care plan  NUTRITION DIAGNOSIS: Inadequate oral intake related to decreased appetite as evidenced by patient report  Goal: Pt to meet >/= 90% of their estimated nutrition needs   Monitor:  PO & supplemental intake, weight, labs, I/O's  Reason for Assessment: Malnutrition Screening Tool Report  68 y.o. female  Admitting Dx: Sepsis  ASSESSMENT: 68 y.o. Female with PMH of DM, HTN, RA on methotrexate, HLD who initially presented to the ED with complaints of nausea/vomiting. In the ED, pt found to have WBC of 26 with HR in 130-150's and lactate of just under 4. Pt was started on aggressive IVF and empiric vanc/zosyn. CXR was unremarkable and CT abd/pelvis was ordered, pending at the time of this dictation.   Patient reports her appetite is "so-so;" states she was consuming 2-3 meals per day; reports on and off nausea; pt also endorses unintentional weight loss, however, unable to quantify amount; per wt readings, pt has had a 10% weight loss since February 2015 (severe for time frame); declined addition of oral nutrition supplements.  Nutrition Focused Physical Exam:  Subcutaneous Fat:  Orbital Region: WNL Upper Arm Region: mild to moderate depletion Thoracic and Lumbar Region: N/A  Muscle:  Temple Region: WNL Clavicle Bone Region: mild to moderate depletion Clavicle and Acromion Bone Region: mild to moderate depletion Scapular Bone Region: N/A Dorsal Hand: N/A Patellar Region: N/A Anterior Thigh Region: N/A Posterior Calf Region: N/A  Edema: none  Patient continues to meet criteria for moderate (non-severe) malnutrition in the context of as evidenced by chronic illness as evidenced by 10% weight loss in < 6 months and mild to  moderate muscle loss.   Height: Ht Readings from Last 1 Encounters:  03/16/14 4\' 10"  (1.473 m)    Weight: Wt Readings from Last 1 Encounters:  03/16/14 135 lb 5.8 oz (61.4 kg)    Ideal Body Weight: 97 lb  % Ideal Body Weight: 139%  Wt Readings from Last 10 Encounters:  03/16/14 135 lb 5.8 oz (61.4 kg)  03/04/14 146 lb (66.225 kg)  11/21/13 151 lb (68.493 kg)  10/16/13 147 lb 0.8 oz (66.7 kg)  10/16/13 148 lb (67.132 kg)  10/15/13 150 lb (68.04 kg)  07/04/13 166 lb (75.297 kg)  05/30/13 169 lb 6.4 oz (76.839 kg)  05/09/13 161 lb (73.029 kg)  05/01/13 170 lb (77.111 kg)    Usual Body Weight: 150 lb  % Usual Body Weight: 90%  BMI:  Body mass index is 28.3 kg/(m^2).  Estimated Nutritional Needs: Kcal: 1650-1850 Protein: 75-85 gm Fluid: 1.6-1.8 L  Skin: Intact   Diet Order: Carb Control  EDUCATION NEEDS: -No education needs identified at this time   Intake/Output Summary (Last 24 hours) at 03/18/14 1142 Last data filed at 03/18/14 1008  Gross per 24 hour  Intake 2775.83 ml  Output    600 ml  Net 2175.83 ml    Labs:   Recent Labs Lab 03/16/14 0113 03/17/14 0450 03/18/14 0800  NA 141 141 142  K 4.2 4.0 4.7  CL 97 108 112  CO2 17* 20 20  BUN 31* 32* 21  CREATININE 1.32* 1.11* 0.83  CALCIUM 10.6* 7.7* 7.5*  GLUCOSE 118* 115* 92    CBG (last 3)   Recent Labs  03/17/14 1531 03/17/14 2205 03/18/14 0813  GLUCAP 110* 111* 100*    Scheduled Meds: . aspirin EC  81 mg Oral Daily  . atorvastatin  40 mg Oral Daily  . cefTRIAXone (ROCEPHIN)  IV  1 g Intravenous Q24H  . cloNIDine  0.3 mg Oral BID  . heparin  5,000 Units Subcutaneous 3 times per day  . insulin aspart  0-15 Units Subcutaneous TID WC  . insulin aspart  0-5 Units Subcutaneous QHS  . pantoprazole  40 mg Oral Daily  . sodium chloride  10-40 mL Intracatheter Q12H    Continuous Infusions:   Past Medical History  Diagnosis Date  . Hypertension   . Normal cardiac stress test      pt can't remember when or where  . Diabetes mellitus without complication     Type 2 NIDDM x 15 years  . Arthritis     osteoarthritis  . Hyperlipidemia   . Hormone replacement therapy (postmenopausal)   . Fatty liver disease, nonalcoholic 0254    hospitalized, MRCP dx fatty liver  . Rheumatoid arthritis(714.0) 09/28/2012  . Obesity, unspecified 12/27/2012  . GERD (gastroesophageal reflux disease)   . Chronic cholecystitis with calculus 05/04/2013  . Unspecified essential hypertension 05/04/2013  . Diabetes type 2, controlled 05/04/2013  . Bursitis of right shoulder     Past Surgical History  Procedure Laterality Date  . Joint replacement      left knee  . Abdominal hysterectomy    . Tonsillectomy    . Nasal sinus surgery    . Total knee arthroplasty  08/01/2012    Procedure: TOTAL KNEE ARTHROPLASTY;  Surgeon: Meredith Pel, MD;  Location: [facility];  Service: Orthopedics;  Laterality: Right;  Right total knee arthroplasty  . Total knee arthroplasty  08/01/2012    RIGHT  KNEE  . Ercp N/A 05/02/2013    Procedure: ENDOSCOPIC RETROGRADE CHOLANGIOPANCREATOGRAPHY (ERCP);  Surgeon: Beryle Beams, MD;  Location: [facility];  Service: Endoscopy;  Laterality: N/A;  . Sphincterotomy  05/02/2013    Procedure: SPHINCTEROTOMY;  Surgeon: Beryle Beams, MD;  Location: [facility];  Service: Endoscopy;;  . Cholecystectomy N/A 05/04/2013    Procedure: LAPAROSCOPIC CHOLECYSTECTOMY WITH INTRAOPERATIVE CHOLANGIOGRAM;  Surgeon: Haywood Lasso, MD;  Location: [facility];  Service: General;  Laterality: N/A;    Arthur Holms, RD, LDN Pager #: [phone] After-Hours Pager #: [phone]

## 2014-03-19 DIAGNOSIS — D539 Nutritional anemia, unspecified: Secondary | ICD-10-CM

## 2014-03-19 LAB — CBC WITH DIFFERENTIAL/PLATELET
Basophils Absolute: 0 10*3/uL (ref 0.0–0.1)
Basophils Relative: 0 % (ref 0–1)
EOS ABS: 0.3 10*3/uL (ref 0.0–0.7)
EOS PCT: 5 % (ref 0–5)
HCT: 23.6 % — ABNORMAL LOW (ref 36.0–46.0)
HEMOGLOBIN: 7.8 g/dL — AB (ref 12.0–15.0)
LYMPHS ABS: 1.6 10*3/uL (ref 0.7–4.0)
Lymphocytes Relative: 27 % (ref 12–46)
MCH: 34.2 pg — AB (ref 26.0–34.0)
MCHC: 33.1 g/dL (ref 30.0–36.0)
MCV: 103.5 fL — AB (ref 78.0–100.0)
MONO ABS: 0 10*3/uL — AB (ref 0.1–1.0)
MONOS PCT: 1 % — AB (ref 3–12)
NEUTROS PCT: 67 % (ref 43–77)
Neutro Abs: 3.9 10*3/uL (ref 1.7–7.7)
Platelets: 221 10*3/uL (ref 150–400)
RBC: 2.28 MIL/uL — ABNORMAL LOW (ref 3.87–5.11)
RDW: 16.4 % — ABNORMAL HIGH (ref 11.5–15.5)
WBC: 5.8 10*3/uL (ref 4.0–10.5)

## 2014-03-19 LAB — OCCULT BLOOD X 1 CARD TO LAB, STOOL
FECAL OCCULT BLD: NEGATIVE
FECAL OCCULT BLD: NEGATIVE
Fecal Occult Bld: NEGATIVE

## 2014-03-19 LAB — PREPARE RBC (CROSSMATCH)

## 2014-03-19 LAB — GLUCOSE, CAPILLARY
Glucose-Capillary: 87 mg/dL (ref 70–99)
Glucose-Capillary: 105 mg/dL — ABNORMAL HIGH (ref 70–99)
Glucose-Capillary: 142 mg/dL — ABNORMAL HIGH (ref 70–99)
Glucose-Capillary: 117 mg/dL — ABNORMAL HIGH (ref 70–99)

## 2014-03-19 LAB — FOLATE RBC: RBC Folate: 535 ng/mL (ref >280)

## 2014-03-19 MED ORDER — LOSARTAN POTASSIUM 50 MG PO TABS
100.0000 mg | ORAL_TABLET | Freq: Every day | ORAL | Status: DC
  Administered 2014-03-19 – 2014-03-20 (×2): 100 mg via ORAL
  Filled 2014-03-19 (×3): qty 2

## 2014-03-19 MED ORDER — LOSARTAN POTASSIUM-HCTZ 100-12.5 MG PO TABS: 1.0000 | ORAL_TABLET | Freq: Every day | ORAL | Status: DC

## 2014-03-19 MED ORDER — HYDROCHLOROTHIAZIDE 12.5 MG PO CAPS
12.5000 mg | ORAL_CAPSULE | Freq: Every day | ORAL | Status: DC
  Administered 2014-03-19 – 2014-03-20 (×2): 12.5 mg via ORAL
  Filled 2014-03-19 (×3): qty 1

## 2014-03-19 MED ORDER — FLUCONAZOLE 150 MG PO TABS
150.0000 mg | ORAL_TABLET | Freq: Once | ORAL | Status: AC
  Administered 2014-03-19: 150 mg via ORAL
  Filled 2014-03-19: qty 1

## 2014-03-19 MED ORDER — NYSTATIN 100000 UNIT/GM EX POWD
Freq: Three times a day (TID) | CUTANEOUS | Status: DC
  Administered 2014-03-19 – 2014-03-20 (×6): via TOPICAL
  Filled 2014-03-19: qty 15

## 2014-03-19 NOTE — Progress Notes (Signed)
Pt hgb 7.8 this morning. MD ordered 2 units PRBCs to be transfused. Teaching done with pt for informed consent for blood transfusion. Consent obtained. However, pt called RN into room to further discuss transfusion, and has decided to decline transfusion at this time. MD notified of pt decision. Continue to monitor. Manya Silvas, RN

## 2014-03-19 NOTE — Plan of Care (Signed)
Problem: Consults Goal: Nutrition Consult-if indicated Outcome: Completed/Met Date Met:  03/19/14 Dietitian consult for poor PO intake with weight loss of 30-40 pounds over the last 6 months. Pt offered strawberry ensure which she stated is very good. Continue to monitor. Manya Silvas, RN

## 2014-03-19 NOTE — Progress Notes (Addendum)
[facility] PROGRESS NOTE  [name] YBW:[id number] DOB: Nov 27, 1945 DOA: 03/16/2014 PCP: Karis Juba, PA-C  Off Service Summary: [phone] who presented with severe sepsis w/ possible uti, improved with broad spec abx and fluids. Sepsis now resolved. However, pt noted to have macrocytic anemia with hgb trending down. Occult stool pending. b12 is borderline low-normal, methylmalonic acid pending. 2 units of prbc's were ordered but pt later refused tx.  Of note, pt' was noted to have 74mm R lung nodule in setting of prior tobacco use. Radiology recs for repeat CT in 12 months.  Assessment/Plan: 1. SIRS vs possible Sepsis with UTI 1. Presenting tachycardia, febrile, leukocytosis with elevated lactate and mild renal insufficiency 2. Source remains unclear at this time, but possibly from UTI 3. UA with no leukocytes or nitrites but has many bacteria, pan cultures neg thus far 4. CT abd/pelvis without acute process 5. On empiric broad coverage with vanc/zoysn 6. HR has improved with IVF 7. Transitioned to rocephin for possible UTI 2. HTN  1. Initially held bp meds except for clonidine to prevent rebound HTN as pt presented mildly hypotensive 2. Will resume diuretic and ARB 3. DM 2  1. Will cont on SSI coverage 4. Nausea/vomiting  1. CT abd/pelvis without acute process 2. Cont supportive care for now 5. Rheumatoid arthritis  1. Held methotrexate secondary to sepsis 2. Cont analgesics as tolerated 6. DVT prophylaxis  1. Heparin subQ 7. Sinus tach  1. Likely secondary to SIRS/sepsis above 2. Resolved 8. Leukocytosis  1. Likely secondary to above 2. Follow cultures 9. Lung nodule 1. 25mm R lung nodule seen on abd CT 2. Follow up chest CT with 43mm nodule with recs for follow up CT in 12 mos 3. CXR without acute process 10. Anemia 1. Macrocytic 2. Will check stool for blood 3. B12 in the low 200 range - will check methylmalonic acid level to confirm b12 deficiency 4. Folic acid  pending 5. Will transfuse 2 units PRBC's 11. Yeast infection 1. Will order diflucan  Code Status: Full Family Communication: Pt in room Disposition Plan: Pending  Consultants:    Procedures:    Antibiotics:  Vanc 7/25>>>7/27  Zosyn 7/25>>>7/27  Rocephin 7/27>>>  HPI/Subjective: Pt without acute events noted overnight. Feels well  Objective: Filed Vitals:   03/18/14 1739 03/18/14 2125 03/19/14 0507 03/19/14 1100  BP: 144/84 149/73 149/84 163/84  Pulse: 74 77 69 90  Temp: 98.4 F (36.9 C) 98.8 F (37.1 C) 98.4 F (36.9 C) 98 F (36.7 C)  TempSrc: Oral Oral Oral Oral  Resp: 17 18 17 18   Height:      Weight:  67.903 kg (149 lb 11.2 oz)    SpO2: 100% 98% 97% 95%    Intake/Output Summary (Last 24 hours) at 03/19/14 1236 Last data filed at 03/19/14 1100  Gross per 24 hour  Intake   1010 ml  Output   3075 ml  Net  -2065 ml   Filed Weights   03/16/14 0115 03/16/14 0944 03/18/14 2125  Weight: 63.504 kg (140 lb) 61.4 kg (135 lb 5.8 oz) 67.903 kg (149 lb 11.2 oz)    Exam:   General:  Awake, in nad  Cardiovascular: regular, s1, s2  Respiratory: normal resp effort, no wheezing  Abdomen: soft,nondistended  Musculoskeletal: perfused, no clubbing   Data Reviewed: Basic Metabolic Panel:  Recent Labs Lab 03/16/14 0113 03/17/14 0450 03/18/14 0800  NA 141 141 142  K 4.2 4.0 4.7  CL 97 108 112  CO2  17* 20 20  GLUCOSE 118* 115* 92  BUN 31* 32* 21  CREATININE 1.32* 1.11* 0.83  CALCIUM 10.6* 7.7* 7.5*   Liver Function Tests:  Recent Labs Lab 03/16/14 0113 03/17/14 0450 03/18/14 0800  AST 32 25 36  ALT 42* 24 31  ALKPHOS 172* 82 144*  BILITOT 0.8 <0.2* 0.3  PROT 8.4* 4.9* 4.7*  ALBUMIN 4.1 2.2* 2.1*   No results found for this basename: LIPASE, AMYLASE,  in the last 168 hours No results found for this basename: AMMONIA,  in the last 168 hours CBC:  Recent Labs Lab 03/16/14 0113 03/17/14 0450 03/18/14 0800 03/18/14 1400 03/19/14 0540   WBC 26.1* 12.1* 6.6 5.7 5.8  NEUTROABS 24.5*  --   --   --  3.9  HGB 12.1 8.1* 7.7* 7.8* 7.8*  HCT 36.4 24.8* 23.9* 24.1* 23.6*  MCV 103.1* 106.9* 106.7* 106.6* 103.5*  PLT 239 157 186 199 221   Cardiac Enzymes: No results found for this basename: CKTOTAL, CKMB, CKMBINDEX, TROPONINI,  in the last 168 hours BNP (last 3 results) No results found for this basename: PROBNP,  in the last 8760 hours CBG:  Recent Labs Lab 03/18/14 1239 03/18/14 1739 03/18/14 2125 03/19/14 0806 03/19/14 1152  GLUCAP 109* 106* 89 87 105*    Recent Results (from the past 240 hour(s))  CULTURE, BLOOD (ROUTINE X 2)     Status: None   Collection Time    03/16/14  6:19 AM      Result Value Ref Range Status   Specimen Description BLOOD LEFT ARM   Final   Special Requests BOTTLES DRAWN AEROBIC AND ANAEROBIC 5CC   Final   Culture  Setup Time     Final   Value: 03/16/2014 14:06     Performed at [facility]   Culture     Final   Value:        BLOOD CULTURE RECEIVED NO GROWTH TO DATE CULTURE WILL BE HELD FOR 5 DAYS BEFORE ISSUING A FINAL NEGATIVE REPORT     Performed at [facility]   Report Status PENDING   Incomplete  URINE CULTURE     Status: None   Collection Time    03/16/14  6:28 AM      Result Value Ref Range Status   Specimen Description URINE, CATHETERIZED   Final   Special Requests NONE   Final   Culture  Setup Time     Final   Value: 03/16/2014 19:21     Performed at [facility]     Final   Value: NO GROWTH     Performed at [facility]   Culture     Final   Value: NO GROWTH     Performed at [facility]   Report Status 03/17/2014 FINAL   Final  CULTURE, BLOOD (ROUTINE X 2)     Status: None   Collection Time    03/16/14  6:55 AM      Result Value Ref Range Status   Specimen Description BLOOD RIGHT ARM   Final   Special Requests BOTTLES DRAWN AEROBIC AND ANAEROBIC 10CC   Final   Culture  Setup Time     Final   Value:  03/16/2014 14:06     Performed at [facility]   Culture     Final   Value:        BLOOD CULTURE RECEIVED NO GROWTH TO DATE CULTURE  WILL BE HELD FOR 5 DAYS BEFORE ISSUING A FINAL NEGATIVE REPORT     Performed at [facility]   Report Status PENDING   Incomplete  MRSA PCR SCREENING     Status: None   Collection Time    03/16/14 11:12 AM      Result Value Ref Range Status   MRSA by PCR NEGATIVE  NEGATIVE Final   Comment:            The GeneXpert MRSA Assay (FDA     approved for NASAL specimens     only), is one component of a     comprehensive MRSA colonization     surveillance program. It is not     intended to diagnose MRSA     infection nor to guide or     monitor treatment for     MRSA infections.     Studies: Ct Chest W Contrast  03/18/2014   CLINICAL DATA:  Right lung mass.  EXAM: CT CHEST WITH CONTRAST  TECHNIQUE: Multidetector CT imaging of the chest was performed during intravenous contrast administration.  CONTRAST:  29mL OMNIPAQUE IOHEXOL 300 MG/ML  SOLN  COMPARISON:  CT scan abdomen dated 03/16/2014  FINDINGS: There is an 8 x 8 x 9 mm pulmonary nodule in the right middle lobe. No hilar or mediastinal adenopathy. There is a 4 mm nodule in the left upper lobe best seen on image 66 of series 203. There is a possible tiny nodule, 3 mm, in the right upper lobe on image 26 of series 202. This could represent a branching vessel however.  There is slight pleural thickening and loculated fluid at the left lung base. Heart size is normal. The patient has dense extensive coronary artery calcification.  No acute osseous abnormalities. PICC line in the superior vena cava.  IMPRESSION: 9 mm nodule in the right middle lobe. The possibility of a primary carcinoma lung should be considered. 4 mm nodule in the left upper lobe could be followed with unenhanced CT scan in 12 months. The possible nodule in the right upper lobe could be assessed at the same time.   Electronically Signed    By: Rozetta Nunnery M.D.   On: 03/18/2014 02:12    Scheduled Meds: . aspirin EC  81 mg Oral Daily  . atorvastatin  40 mg Oral Daily  . cefTRIAXone (ROCEPHIN)  IV  1 g Intravenous Q24H  . cloNIDine  0.3 mg Oral BID  . heparin  5,000 Units Subcutaneous 3 times per day  . insulin aspart  0-15 Units Subcutaneous TID WC  . insulin aspart  0-5 Units Subcutaneous QHS  . nystatin   Topical TID  . pantoprazole  40 mg Oral Daily  . polyethylene glycol  17 g Oral Daily  . sodium chloride  10-40 mL Intracatheter Q12H   Continuous Infusions:    Principal Problem:   Sepsis Active Problems:   Essential hypertension, benign   Rheumatoid arthritis(714.0)   Obesity, unspecified   Diabetes type 2, controlled   Malnutrition of moderate degree  Time spent: 31min  Raeonna Milo, [place] [facility] Pager [phone]. If 7PM-7AM, please contact night-coverage at [email or web address], password [facility] 03/19/2014, 12:36 PM  LOS: 3 days

## 2014-03-20 ENCOUNTER — Encounter (HOSPITAL_COMMUNITY): Payer: Self-pay | Admitting: General Practice

## 2014-03-20 LAB — CBC
HEMATOCRIT: 25.3 % — AB (ref 36.0–46.0)
Hemoglobin: 8.3 g/dL — ABNORMAL LOW (ref 12.0–15.0)
MCH: 34.4 pg — AB (ref 26.0–34.0)
MCHC: 32.8 g/dL (ref 30.0–36.0)
MCV: 105 fL — AB (ref 78.0–100.0)
PLATELETS: 235 10*3/uL (ref 150–400)
RBC: 2.41 MIL/uL — ABNORMAL LOW (ref 3.87–5.11)
RDW: 16.4 % — AB (ref 11.5–15.5)
WBC: 5.2 10*3/uL (ref 4.0–10.5)

## 2014-03-20 LAB — BASIC METABOLIC PANEL
ANION GAP: 11 (ref 5–15)
BUN: 12 mg/dL (ref 6–23)
CALCIUM: 8.3 mg/dL — AB (ref 8.4–10.5)
CO2: 24 mEq/L (ref 19–32)
CREATININE: 0.69 mg/dL (ref 0.50–1.10)
Chloride: 108 mEq/L (ref 96–112)
GFR, EST NON AFRICAN AMERICAN: 87 mL/min — AB (ref >90)
Glucose, Bld: 100 mg/dL — ABNORMAL HIGH (ref 70–99)
Potassium: 4 mEq/L (ref 3.7–5.3)
Sodium: 143 mEq/L (ref 137–147)

## 2014-03-20 LAB — GLUCOSE, CAPILLARY
Glucose-Capillary: 109 mg/dL — ABNORMAL HIGH (ref 70–99)
Glucose-Capillary: 106 mg/dL — ABNORMAL HIGH (ref 70–99)
Glucose-Capillary: 176 mg/dL — ABNORMAL HIGH (ref 70–99)

## 2014-03-20 MED ORDER — CIPROFLOXACIN HCL 500 MG PO TABS: 500.0000 mg | ORAL_TABLET | Freq: Two times a day (BID) | ORAL | Status: DC

## 2014-03-20 MED ORDER — ENSURE COMPLETE PO LIQD
237.0000 mL | Freq: Two times a day (BID) | ORAL | Status: DC
  Administered 2014-03-20: 237 mL via ORAL

## 2014-03-20 MED ORDER — CYANOCOBALAMIN 500 MCG PO TABS: 500.0000 ug | ORAL_TABLET | Freq: Every day | ORAL | Status: DC

## 2014-03-20 NOTE — Discharge Instructions (Signed)

## 2014-03-20 NOTE — Clinical Documentation Improvement (Signed)
Presents with Sepsis secondary to UTI; meets SIRS criteria.   Nutritional Consult notes Moderate Malnutrition due to inadequate oral intake related to decreased appetite  10% weight loss in < 6 months  Please assess consult and render an opinion of including severity in next Progress Note and Discharge Summary.  Thank You, Zoila Shutter ,RN Clinical Documentation Specialist:  [facility] [employer] [facility]

## 2014-03-20 NOTE — Progress Notes (Signed)
NUTRITION FOLLOW UP  Intervention:   - Ensure Complete po BID, each supplement provides 350 kcal and 13 grams of protein - RD will continue to follow for nutrition care plan.  Nutrition Dx:   Inadequate oral intake related to nausea and vomiting as evidenced by weight loss and reported intake less than estimated needs.  Goal:   Pt to meet >/= 90% of their estimated nutrition needs   Monitor:   Weight trends, po intake, acceptance of supplements, labs  Assessment:   68 y.o. female with a hx of diabetes, HTN, RA on methotrexate, HLD who initially presented to the ED with complaints of nausea/vomiting.  7/27: Patient reports her appetite is "so-so;" states she was consuming 2-3 meals per day; reports on and off nausea; pt also endorses unintentional weight loss, however, unable to quantify amount; per wt readings, pt has had a 10% weight loss since February 2015 (severe for time frame); declined addition of oral nutrition supplements.  7/29: - Pt reports that her appetite is improving somewhat. She says that she eats a good breakfast, a "so-so" lunch and often is not hungry for dinner. Pt agreed to accept nutritional supplements to increase the calories and protein in her diet and to slow weight loss.   CBG's 87-142  Height: Ht Readings from Last 1 Encounters:  03/16/14 4\' 10"  (1.473 m)    Weight Status:   Wt Readings from Last 1 Encounters:  03/19/14 145 lb 11.2 oz (66.089 kg)   Re-estimated needs:  Kcal: 1800-2000 Protein: 90-100 g Fluid: 1.8-2.0 L/day  Skin: Intact  Diet Order: Carb Control   Intake/Output Summary (Last 24 hours) at 03/20/14 1237 Last data filed at 03/20/14 0900  Gross per 24 hour  Intake    720 ml  Output   1050 ml  Net   -330 ml    Last BM: prior to admission   Labs:   Recent Labs Lab 03/17/14 0450 03/18/14 0800 03/20/14 0652  NA 141 142 143  K 4.0 4.7 4.0  CL 108 112 108  CO2 20 20 24   BUN 32* 21 12  CREATININE 1.11* 0.83 0.69   CALCIUM 7.7* 7.5* 8.3*  GLUCOSE 115* 92 100*    CBG (last 3)   Recent Labs  03/19/14 2141 03/20/14 0751 03/20/14 1123  GLUCAP 117* 109* 106*    Scheduled Meds: . aspirin EC  81 mg Oral Daily  . atorvastatin  40 mg Oral Daily  . cefTRIAXone (ROCEPHIN)  IV  1 g Intravenous Q24H  . cloNIDine  0.3 mg Oral BID  . heparin  5,000 Units Subcutaneous 3 times per day  . losartan  100 mg Oral Daily   And  . hydrochlorothiazide  12.5 mg Oral Daily  . insulin aspart  0-15 Units Subcutaneous TID WC  . insulin aspart  0-5 Units Subcutaneous QHS  . nystatin   Topical TID  . pantoprazole  40 mg Oral Daily  . polyethylene glycol  17 g Oral Daily  . sodium chloride  10-40 mL Intracatheter Q12H    Continuous Infusions:   Terrace Arabia RD, LDN

## 2014-03-21 ENCOUNTER — Telehealth: Payer: Self-pay | Admitting: *Deleted

## 2014-03-21 LAB — TYPE AND SCREEN
ABO/RH(D): A POS
Antibody Screen: NEGATIVE
Unit division: 0
Unit division: 0

## 2014-03-21 LAB — GLUCOSE, CAPILLARY
Glucose-Capillary: 97 mg/dL (ref 70–99)
Glucose-Capillary: 108 mg/dL — ABNORMAL HIGH (ref 70–99)

## 2014-03-21 NOTE — Telephone Encounter (Signed)
PA received call from hospital stating that patient has been in hospital x5 days and would be discharged on 03/21/2014.  Call placed to patient to schedule appointment.   [place].

## 2014-03-21 NOTE — Progress Notes (Signed)
Patient's family members arrived at 2115 to take the patient home per discharge orders. Patient stated that the day shift MD was suppose to write her a doctors note stating that she could return to work on Monday. Nurse called on call MD Dia Crawford) who stated that due to incomplete paper work patient can not be discharged and note can not be given at this time. Patient and family became very upset with the news and requested to speak to "Leadership and a higher up person" immediately. The Director Jonelle Sidle Caple) card was given to the patient and House Coverage Gwyndolyn Saxon) was notified to come and speak to the patient. House Coverage came to the bedside. Patient was agreeable to stay the night again and Nurse informed the patient that the MD in the morning will be paged as soon as they become available to complete the discharge paperwork. Patient demonstrated compliance and took her scheduled medication as normal. Will continue to assess and monitor the patient throughout the night.   Tirsa Gail Berdine Addison, RN  (late entry)

## 2014-03-21 NOTE — Discharge Summary (Signed)
Physician Discharge Summary  [name] YKD:[id number] DOB: [birth year] DOA: 03/16/2014  PCP: Katherine Juba, PA-C  Admit date: 03/16/2014 Discharge date: 03/21/2014  Time spent: 35 minutes  Recommendations for Outpatient Follow-up:  1. Follow up with PCP in 1-2 weeks   Recommendations for primary care physician for things to follow:  Further workup on pulmonary nodule Repeat BMP and CBC  Discharge Diagnoses:  Principal Problem:   Sepsis Active Problems:   Essential hypertension, benign   Rheumatoid arthritis(714.0)   Obesity, unspecified   Diabetes type 2, controlled   Malnutrition of moderate degree  Discharge Condition: stable  Diet recommendation: diabetic  Filed Weights   03/16/14 0944 03/18/14 2125 03/19/14 2143  Weight: 61.4 kg (135 lb 5.8 oz) 67.903 kg (149 lb 11.2 oz) 66.089 kg (145 lb 11.2 oz)    History of present illness:  [name] is a 68 y.o. female ith a hx of diabetes, HTN, RA on methotrexate, HLD who initially presented to the ED with  complaints of nausea/vomiting. In the Ed, pt found to have WBC of 26 with HR in 130-150's and lactate of just under 4. Pt was started on aggressive IVF and empiric vanc/zosyn. CXR was unremarkable and CT abd/pelvis was ordered, pending at the time of this dictation.  On further questioning, pt reports recently having multiple superficial draining skin lesions over R foot, and was later informed by her provider as being MRSA. Pt was given a course of bactrim. Denies abd pain, diarrhea, or constipation  Hospital Course:  Of note, scripts and work excuse letter printed, AVS completed and final discharge order placed on 03/20/2014. Night shift staff could not finding her printed letter for work (which was already in [employer] under letters tab) and she remained here until 7/30 am.  SIRS vs possible Sepsis with UTI Presenting tachycardia, febrile, leukocytosis with elevated lactate and mild renal insufficiency Source remained  somewhat elusive but most likely due to UTI UA with no leukocytes or nitrites but has many bacteria, pan cultures neg thus far CT abd/pelvis without acute process but did pick up an pulmonary nodule. On empiric broad coverage with vanc/zoysn, then rocephin and will be discharged to complete a course of 5 additional days with Ciprofloxacin.  HTN   Initially held bp meds except for clonidine to prevent rebound HTN as pt presented mildly hypotensive Resumed diuretic and ARB DM 2   Will cont on SSI coverage while inpatient Nausea/vomiting   CT abd/pelvis without acute process Resolved Rheumatoid arthritis   Held methotrexate while hospitalized secondary to sepsis Cont analgesics as tolerated Lung nodule 41mm R lung nodule seen on abd CT thus chest CP was pursued.  Full read as below, the 9 mm nodule somewhat concerning. I called and personally discussed with his PCP Katherine Billet, PA about this finding and will set up further workup as an outpatient.   Anemia Macrocytic, likely B12 deficiency, she was started on supplementation. MMA levels still pending at the time of discharge.  Placed order for blood transfusion however patient declined. Hb stable on d/c   Procedures:  None    Consultations:  None   Discharge Exam: Filed Vitals:   03/20/14 0900 03/20/14 1802 03/20/14 2238 03/21/14 0540  BP: 148/91 134/86 176/94 147/88  Pulse: 92 96 95 66  Temp: 98.1 F (36.7 C) 98.3 F (36.8 C) 98.5 F (36.9 C) 97.9 F (36.6 C)  TempSrc: Oral Oral Oral Oral  Resp: 18 17 16 16   Height:  Weight:      SpO2: 97% 97% 98% 93%   General: NAD Cardiovascular: RRR Respiratory: CTA biL  Discharge Instructions    Medication List    STOP taking these medications       sulfamethoxazole-trimethoprim 800-160 MG per tablet  Commonly known as:  BACTRIM DS      TAKE these medications       amLODipine 5 MG tablet  Commonly known as:  NORVASC  Take 5 mg by mouth daily.     aspirin EC 81 MG  tablet  Take 81 mg by mouth daily.     atorvastatin 40 MG tablet  Commonly known as:  LIPITOR  Take 40 mg by mouth daily.     CALCIUM 600 + D PO  Take 1 tablet by mouth 2 (two) times daily.     ciprofloxacin 500 MG tablet  Commonly known as:  CIPRO  Take 1 tablet (500 mg total) by mouth 2 (two) times daily.     cloNIDine 0.3 MG tablet  Commonly known as:  CATAPRES  Take 1 tablet (0.3 mg total) by mouth 2 (two) times daily.     cyanocobalamin 500 MCG tablet  Take 1 tablet (500 mcg total) by mouth daily.     estradiol 0.5 MG tablet  Commonly known as:  ESTRACE  Take 0.5 mg by mouth 2 (two) times daily.     fish oil-omega-3 fatty acids 1000 MG capsule  Take 1 g by mouth at bedtime.     folic acid 1 MG tablet  Commonly known as:  FOLVITE  Take 1 tablet by mouth 2 (two) times daily.     losartan-hydrochlorothiazide 100-12.5 MG per tablet  Commonly known as:  HYZAAR  Take 1 tablet by mouth daily.     metFORMIN 1000 MG tablet  Commonly known as:  GLUCOPHAGE  Take 1,000 mg by mouth 2 (two) times daily with a meal.     methotrexate 2.5 MG tablet  Commonly known as:  RHEUMATREX  Take 6 tablets by mouth once a week.     metoprolol tartrate 25 MG tablet  Commonly known as:  LOPRESSOR  Take 12.5 mg by mouth 2 (two) times daily.     multivitamins ther. w/minerals Tabs tablet  Take 1 tablet by mouth daily.     omeprazole 20 MG capsule  Commonly known as:  PRILOSEC  Take 1 capsule (20 mg total) by mouth daily.     VOLTAREN 1 % Gel  Generic drug:  diclofenac sodium  Apply 3 g topically daily as needed (pain). To 3 large joints           Follow-up Information   Follow up with [facility], PA-C. Schedule an appointment as soon as possible for a visit in 1 week.   Specialty:  Physician Assistant   Contact information:   [place]  [address] [phone]      The results of significant diagnostics from this hospitalization (including  imaging, microbiology, ancillary and laboratory) are listed below for reference.    Significant Diagnostic Studies: Ct Abdomen Pelvis Wo Contrast  03/16/2014   CLINICAL DATA:  EMESIS  EXAM: CT ABDOMEN AND PELVIS WITHOUT CONTRAST  TECHNIQUE: Multidetector CT imaging of the abdomen and pelvis was performed following the standard protocol without IV contrast.  COMPARISON:  10/16/2013  FINDINGS: 7 mm right middle lobe nodule suspected on the first cut of study. Visualized lung bases otherwise unremarkable. Small hiatal hernia. Moderate aortoiliac  and coronary arterial calcifications. Surgical clips in the gallbladder fossa. Unremarkable liver, spleen, adrenal glands. Mild diffuse pancreatic parenchymal atrophy. Bilateral renal cysts, largest from the upper pole right kidney 6.9 cm, on the left from the upper pole 15 mm. No hydronephrosis. 2 mm parenchymal calcification in mid left kidney stable. Stomach, small bowel, colon are nondilated. Appendix not discretely identified. Right femoral venous catheter partially seen. Urinary bladder incompletely distended. Previous hysterectomy. Bilateral pelvic phleboliths. No ascites. No free air. No adenopathy localized. Lumbar dextroscoliosis apex L2-3 with multilevel spondylitic changes.  IMPRESSION: 1. No acute abdominal process. 2. Right middle lobe pulmonary nodule suspected. Consider elective CT chest for further characterization. 3. Atherosclerosis, including aortoiliac and coronary artery disease. Please note that although the presence of coronary artery calcium documents the presence of coronary artery disease, the severity of this disease and any potential stenosis cannot be assessed on this non-gated CT examination. Assessment for potential risk factor modification, dietary therapy or pharmacologic therapy may be warranted, if clinically indicated. 4. Small hiatal hernia.   Electronically Signed   By: Arne Cleveland M.D.   On: 03/16/2014 09:47   Ct Chest W  Contrast  03/18/2014   CLINICAL DATA:  Right lung mass.  EXAM: CT CHEST WITH CONTRAST  TECHNIQUE: Multidetector CT imaging of the chest was performed during intravenous contrast administration.  CONTRAST:  35mL OMNIPAQUE IOHEXOL 300 MG/ML  SOLN  COMPARISON:  CT scan abdomen dated 03/16/2014  FINDINGS: There is an 8 x 8 x 9 mm pulmonary nodule in the right middle lobe. No hilar or mediastinal adenopathy. There is a 4 mm nodule in the left upper lobe best seen on image 66 of series 203. There is a possible tiny nodule, 3 mm, in the right upper lobe on image 26 of series 202. This could represent a branching vessel however.  There is slight pleural thickening and loculated fluid at the left lung base. Heart size is normal. The patient has dense extensive coronary artery calcification.  No acute osseous abnormalities. PICC line in the superior vena cava.  IMPRESSION: 9 mm nodule in the right middle lobe. The possibility of a primary carcinoma lung should be considered. 4 mm nodule in the left upper lobe could be followed with unenhanced CT scan in 12 months. The possible nodule in the right upper lobe could be assessed at the same time.   Electronically Signed   By: Rozetta Nunnery M.D.   On: 03/18/2014 02:12   Dg Chest Port 1 View  03/16/2014   CLINICAL DATA:  PICC line placement.  EXAM: PORTABLE CHEST - 1 VIEW  COMPARISON:  03/16/2014.  FINDINGS: The right PICC line tip is at the level of the carina, approximately 4 cm above the cavoatrial junction. No complicating features. The heart and lungs are normal and stable.  IMPRESSION: Right PICC line tip is in the mid SVC.   Electronically Signed   By: Kalman Jewels M.D.   On: 03/16/2014 12:57   Dg Chest Portable 1 View  03/16/2014   CLINICAL DATA:  Emesis  EXAM: PORTABLE CHEST - 1 VIEW  COMPARISON:  Prior radiograph from 10/15/2013  FINDINGS: The cardiac and mediastinal silhouettes are stable in size and contour, and remain within normal limits.  The lungs are  normally inflated. No airspace consolidation, pleural effusion, or pulmonary edema is identified. There is no pneumothorax.  No acute osseous abnormality identified. Degenerative osteoarthrosis noted about the left shoulder.  IMPRESSION: No acute cardiopulmonary abnormality.   Electronically  Signed   By: Jeannine Boga M.D. On: 03/16/2014 06:53   Microbiology: Recent Results (from the past 240 hour(s))  CULTURE, BLOOD (ROUTINE X 2)     Status: None   Collection Time    03/16/14  6:19 AM      Result Value Ref Range Status   Specimen Description BLOOD LEFT ARM   Final   Special Requests BOTTLES DRAWN AEROBIC AND ANAEROBIC 5CC   Final   Culture  Setup Time     Final   Value: 03/16/2014 14:06     Performed at [facility]   Culture     Final   Value:        BLOOD CULTURE RECEIVED NO GROWTH TO DATE CULTURE WILL BE HELD FOR 5 DAYS BEFORE ISSUING A FINAL NEGATIVE REPORT     Performed at [facility]   Report Status PENDING   Incomplete  URINE CULTURE     Status: None   Collection Time    03/16/14  6:28 AM      Result Value Ref Range Status   Specimen Description URINE, CATHETERIZED   Final   Special Requests NONE   Final   Culture  Setup Time     Final   Value: 03/16/2014 19:21     Performed at [place]     Final   Value: NO GROWTH     Performed at [facility]   Culture     Final   Value: NO GROWTH     Performed at [facility]   Report Status 03/17/2014 FINAL   Final  CULTURE, BLOOD (ROUTINE X 2)     Status: None   Collection Time    03/16/14  6:55 AM      Result Value Ref Range Status   Specimen Description BLOOD RIGHT ARM   Final   Special Requests BOTTLES DRAWN AEROBIC AND ANAEROBIC 10CC   Final   Culture  Setup Time     Final   Value: 03/16/2014 14:06     Performed at [facility]   Culture     Final   Value:        BLOOD CULTURE RECEIVED NO GROWTH TO DATE CULTURE WILL BE HELD FOR 5 DAYS BEFORE ISSUING  A FINAL NEGATIVE REPORT     Performed at [facility]   Report Status PENDING   Incomplete  MRSA PCR SCREENING     Status: None   Collection Time    03/16/14 11:12 AM      Result Value Ref Range Status   MRSA by PCR NEGATIVE  NEGATIVE Final   Comment:            The GeneXpert MRSA Assay (FDA     approved for NASAL specimens     only), is one component of a     comprehensive MRSA colonization     surveillance program. It is not     intended to diagnose MRSA     infection nor to guide or     monitor treatment for     MRSA infections.   Labs: Basic Metabolic Panel:  Recent Labs Lab 03/16/14 0113 03/17/14 0450 03/18/14 0800 03/20/14 0652  NA 141 141 142 143  K 4.2 4.0 4.7 4.0  CL 97 108 112 108  CO2 17* 20 20 24   GLUCOSE 118* 115* 92 100*  BUN 31* 32* 21 12  CREATININE 1.32* 1.11*  0.83 0.69  CALCIUM 10.6* 7.7* 7.5* 8.3*   Liver Function Tests:  Recent Labs Lab 03/16/14 0113 03/17/14 0450 03/18/14 0800  AST 32 25 36  ALT 42* 24 31  ALKPHOS 172* 82 144*  BILITOT 0.8 <0.2* 0.3  PROT 8.4* 4.9* 4.7*  ALBUMIN 4.1 2.2* 2.1*   CBC:  Recent Labs Lab 03/16/14 0113 03/17/14 0450 03/18/14 0800 03/18/14 1400 03/19/14 0540 03/20/14 0652  WBC 26.1* 12.1* 6.6 5.7 5.8 5.2  NEUTROABS 24.5*  --   --   --  3.9  --   HGB 12.1 8.1* 7.7* 7.8* 7.8* 8.3*  HCT 36.4 24.8* 23.9* 24.1* 23.6* 25.3*  MCV 103.1* 106.9* 106.7* 106.6* 103.5* 105.0*  PLT 239 157 186 199 221 235   CBG:  Recent Labs Lab 03/20/14 0751 03/20/14 1123 03/20/14 1645 03/20/14 2236 03/21/14 0750  GLUCAP 109* 106* 176* 97 108*   Signed:  Cylus Douville  [facility] 03/21/2014, 3:00 PM

## 2014-03-22 LAB — CULTURE, BLOOD (ROUTINE X 2)
Culture: NO GROWTH
Culture: NO GROWTH

## 2014-03-22 LAB — METHYLMALONIC ACID, SERUM: METHYLMALONIC ACID, QUANTITATIVE: 179 nmol/L (ref 87–318)

## 2014-03-22 NOTE — Telephone Encounter (Signed)
Appointment scheduled for 03/27/2014 @ 8:30am.

## 2014-03-27 ENCOUNTER — Encounter: Payer: Self-pay | Admitting: Physician Assistant

## 2014-03-27 ENCOUNTER — Ambulatory Visit (INDEPENDENT_AMBULATORY_CARE_PROVIDER_SITE_OTHER): Payer: BC Managed Care – PPO | Admitting: Physician Assistant

## 2014-03-27 VITALS — BP 130/70 | HR 60 | Temp 98.6°F | Resp 16 | Wt 148.0 lb

## 2014-03-27 DIAGNOSIS — D649 Anemia, unspecified: Secondary | ICD-10-CM

## 2014-03-27 DIAGNOSIS — R112 Nausea with vomiting, unspecified: Secondary | ICD-10-CM

## 2014-03-27 DIAGNOSIS — D72829 Elevated white blood cell count, unspecified: Secondary | ICD-10-CM

## 2014-03-27 DIAGNOSIS — Z09 Encounter for follow-up examination after completed treatment for conditions other than malignant neoplasm: Secondary | ICD-10-CM

## 2014-03-27 DIAGNOSIS — N289 Disorder of kidney and ureter, unspecified: Secondary | ICD-10-CM

## 2014-03-27 DIAGNOSIS — R918 Other nonspecific abnormal finding of lung field: Secondary | ICD-10-CM

## 2014-03-27 LAB — BASIC METABOLIC PANEL WITH GFR
BUN: 16 mg/dL (ref 6–23)
CHLORIDE: 103 meq/L (ref 96–112)
CO2: 17 mEq/L — ABNORMAL LOW (ref 19–32)
Calcium: 9.7 mg/dL (ref 8.4–10.5)
Creat: 0.88 mg/dL (ref 0.50–1.10)
GFR, EST AFRICAN AMERICAN: 78 mL/min
GFR, Est Non African American: 67 mL/min
GLUCOSE: 74 mg/dL (ref 70–99)
POTASSIUM: 4.1 meq/L (ref 3.5–5.3)
Sodium: 140 mEq/L (ref 135–145)

## 2014-03-27 LAB — CBC W/MCH & 3 PART DIFF
HCT: 32.6 % — ABNORMAL LOW (ref 36.0–46.0)
HEMOGLOBIN: 10.3 g/dL — AB (ref 12.0–15.0)
LYMPHS ABS: 1.4 10*3/uL (ref 0.7–4.0)
Lymphocytes Relative: 21 % (ref 12–46)
MCH: 34.4 pg — AB (ref 26.0–34.0)
MCHC: 31.6 g/dL (ref 30.0–36.0)
MCV: 109 fL — ABNORMAL HIGH (ref 78.0–100.0)
NEUTROS ABS: 4.8 10*3/uL (ref 1.7–7.7)
NEUTROS PCT: 73 % (ref 43–77)
Platelets: 304 10*3/uL (ref 150–400)
RBC: 2.99 MIL/uL — AB (ref 3.87–5.11)
RDW: 18.1 % — ABNORMAL HIGH (ref 11.5–15.5)
WBC mixed population %: 6 % (ref 3–18)
WBC mixed population: 0.4 10*3/uL (ref 0.1–1.8)
WBC: 6.6 10*3/uL (ref 4.0–10.5)

## 2014-03-27 MED ORDER — PROMETHAZINE HCL 25 MG PO TABS: 25.0000 mg | ORAL_TABLET | Freq: Three times a day (TID) | ORAL | Status: DC | PRN

## 2014-03-27 NOTE — Progress Notes (Signed)
Patient ID: [name] MRN: [id number], DOB: June 25, 1946, 68 y.o. Date of Encounter: @DATE @  Chief Complaint:  Chief Complaint  Patient presents with  . Hospitalization Follow-up    july 25- July 28 for dehydration and UTI at [facility]    HPI: 68 y.o. year old female  presents for followup of recent hospitalization.  THE FOLLOWING IS COPIED FROM THE DISCHARGE SUMMARY:  Recommendations for primary care physician for things to follow:  Further workup on pulmonary nodule Repeat BMP and CBC   Discharge Diagnoses:   Principal Problem:   Sepsis Active Problems:   Essential hypertension, benign   Rheumatoid arthritis(714.0)   Obesity, unspecified   Diabetes type 2, controlled   Malnutrition of moderate degree   History of present illness:   [name] is a 68 y.o. female ith a hx of diabetes, HTN, RA on methotrexate, HLD who initially presented to the ED with  complaints of nausea/vomiting. In the Ed, pt found to have WBC of 26 with HR in 130-150's and lactate of just under 4. Pt was started on aggressive IVF and empiric vanc/zosyn. CXR was unremarkable and CT abd/pelvis was ordered, pending at the time of this dictation.  On further questioning, pt reports recently having multiple superficial draining skin lesions over R foot, and was later informed by her provider as being MRSA. Pt was given a course of bactrim. Denies abd pain, diarrhea, or constipation   Hospital Course:  Of note, scripts and work excuse letter printed, AVS completed and final discharge order placed on 03/20/2014. Night shift staff could not finding her printed letter for work (which was already in [employer] under letters tab) and she remained here until 7/30 am.   SIRS vs possible Sepsis with UTI Presenting tachycardia, febrile, leukocytosis with elevated lactate and mild renal insufficiency Source remained somewhat elusive but most likely due to UTI UA with no leukocytes or nitrites but has many bacteria, pan  cultures neg thus far CT abd/pelvis without acute process but did pick up an pulmonary nodule. On empiric broad coverage with vanc/zoysn, then rocephin and will be discharged to complete a course of 5 additional days with Ciprofloxacin.   HTN   Initially held bp meds except for clonidine to prevent rebound HTN as pt presented mildly hypotensive Resumed diuretic and ARB DM 2   Will cont on SSI coverage while inpatient Nausea/vomiting   CT abd/pelvis without acute process Resolved Rheumatoid arthritis    Held methotrexate while hospitalized secondary to sepsis Cont analgesics as tolerated Lung nodule 60mm R lung nodule seen on abd CT thus chest CP was pursued.   Full read as below, the 9 mm nodule somewhat concerning. I called and personally discussed with his PCP Katherine Billet, PA about this finding and will set up further workup as an outpatient.    Anemia Macrocytic, likely B12 deficiency, she was started on supplementation. MMA levels still pending at the time of discharge.  Placed order for blood transfusion however patient declined. Hb stable on d/c   Follow-up Information     Follow up with [facility] BETH, PA-C. Schedule an appointment as soon as possible for a visit in 1 week.     Specialty:  Physician Assistant     Contact information:     [address] [place] [address] [phone]    --------------------------------------------------------------------------------   The results of significant diagnostics from this hospitalization (including imaging, microbiology, ancillary and laboratory) are listed below for reference.  Significant Diagnostic Studies: Ct Abdomen Pelvis Wo Contrast  03/16/2014   CLINICAL DATA:  EMESIS  EXAM: CT ABDOMEN AND PELVIS WITHOUT CONTRAST  TECHNIQUE: Multidetector CT imaging of the abdomen and pelvis was performed following the standard protocol without IV contrast.  COMPARISON:  10/16/2013  FINDINGS: 7 mm right middle lobe nodule  suspected on the first cut of study. Visualized lung bases otherwise unremarkable. Small hiatal hernia. Moderate aortoiliac and coronary arterial calcifications. Surgical clips in the gallbladder fossa. Unremarkable liver, spleen, adrenal glands. Mild diffuse pancreatic parenchymal atrophy. Bilateral renal cysts, largest from the upper pole right kidney 6.9 cm, on the left from the upper pole 15 mm. No hydronephrosis. 2 mm parenchymal calcification in mid left kidney stable. Stomach, small bowel, colon are nondilated. Appendix not discretely identified. Right femoral venous catheter partially seen. Urinary bladder incompletely distended. Previous hysterectomy. Bilateral pelvic phleboliths. No ascites. No free air. No adenopathy localized. Lumbar dextroscoliosis apex L2-3 with multilevel spondylitic changes.  IMPRESSION: 1. No acute abdominal process. 2. Right middle lobe pulmonary nodule suspected. Consider elective CT chest for further characterization. 3. Atherosclerosis, including aortoiliac and coronary artery disease. Please note that although the presence of coronary artery calcium documents the presence of coronary artery disease, the severity of this disease and any potential stenosis cannot be assessed on this non-gated CT examination. Assessment for potential risk factor modification, dietary therapy or pharmacologic therapy may be warranted, if clinically indicated. 4. Small hiatal hernia.   Electronically Signed   By: Arne Cleveland M.D.   On: 03/16/2014 09:47    Ct Chest W Contrast  03/18/2014   CLINICAL DATA:  Right lung mass.  EXAM: CT CHEST WITH CONTRAST  TECHNIQUE: Multidetector CT imaging of the chest was performed during intravenous contrast administration.  CONTRAST:  56mL OMNIPAQUE IOHEXOL 300 MG/ML  SOLN  COMPARISON:  CT scan abdomen dated 03/16/2014  FINDINGS: There is an 8 x 8 x 9 mm pulmonary nodule in the right middle lobe. No hilar or mediastinal adenopathy. There is a 4 mm nodule in  the left upper lobe best seen on image 66 of series 203. There is a possible tiny nodule, 3 mm, in the right upper lobe on image 26 of series 202. This could represent a branching vessel however.  There is slight pleural thickening and loculated fluid at the left lung base. Heart size is normal. The patient has dense extensive coronary artery calcification.  No acute osseous abnormalities. PICC line in the superior vena cava.  IMPRESSION: 9 mm nodule in the right middle lobe. The possibility of a primary carcinoma lung should be considered. 4 mm nodule in the left upper lobe could be followed with unenhanced CT scan in 12 months. The possible nodule in the right upper lobe could be assessed at the same time.   Electronically Signed   By: Rozetta Nunnery M.D.   On: 03/18/2014 02:12    Dg Chest Port 1 View  03/16/2014   CLINICAL DATA:  PICC line placement.  EXAM: PORTABLE CHEST - 1 VIEW  COMPARISON:  03/16/2014.  FINDINGS: The right PICC line tip is at the level of the carina, approximately 4 cm above the cavoatrial junction. No complicating features. The heart and lungs are normal and stable.  IMPRESSION: Right PICC line tip is in the mid SVC.   Electronically Signed   By: Kalman Jewels M.D.   On: 03/16/2014 12:57    Dg Chest Portable 1 View  03/16/2014   CLINICAL DATA:  Emesis  EXAM: PORTABLE CHEST - 1 VIEW  COMPARISON:  Prior radiograph from 10/15/2013  FINDINGS: The cardiac and mediastinal silhouettes are stable in size and contour, and remain within normal limits.  The lungs are normally inflated. No airspace consolidation, pleural effusion, or pulmonary edema is identified. There is no pneumothorax.  No acute osseous abnormality identified. Degenerative osteoarthrosis noted about the left shoulder.  IMPRESSION: No acute cardiopulmonary abnormality.   Electronically Signed   By: Jeannine Boga M.D. On: 03/16/2014 06:53    Microbiology: Recent Results (from the past 240 hour(s))   CULTURE, BLOOD  (ROUTINE X 2)     Status: None     Collection Time      03/16/14  6:19 AM       Result  Value  Ref Range  Status     Specimen Description  BLOOD LEFT ARM     Final     Special Requests  BOTTLES DRAWN AEROBIC AND ANAEROBIC 5CC     Final     Culture  Setup Time        Final     Value:  03/16/2014 14:06        Performed at [facility]     Culture        Final     Value:         BLOOD CULTURE RECEIVED NO GROWTH TO DATE CULTURE WILL BE HELD FOR 5 DAYS BEFORE ISSUING A FINAL NEGATIVE REPORT        Performed at [facility]     Report Status  PENDING     Incomplete   URINE CULTURE     Status: None     Collection Time      03/16/14  6:28 AM       Result  Value  Ref Range  Status     Specimen Description  URINE, CATHETERIZED     Final     Special Requests  NONE     Final     Culture  Setup Time        Final     Value:  03/16/2014 19:21        Performed at [facility]        Final     Value:  NO GROWTH        Performed at [facility]     Culture        Final     Value:  NO GROWTH        Performed at [facility]     Report Status  03/17/2014 FINAL     Final   CULTURE, BLOOD (ROUTINE X 2)     Status: None     Collection Time      03/16/14  6:55 AM       Result  Value  Ref Range  Status     Specimen Description  BLOOD RIGHT ARM     Final     Special Requests  BOTTLES DRAWN AEROBIC AND ANAEROBIC 10CC     Final     Culture  Setup Time        Final     Value:  03/16/2014 14:06        Performed at [facility]     Culture        Final     Value:  BLOOD CULTURE RECEIVED NO GROWTH TO DATE CULTURE WILL BE HELD FOR 5 DAYS BEFORE ISSUING A FINAL NEGATIVE REPORT        Performed at [facility]     Report Status  PENDING     Incomplete   MRSA PCR SCREENING     Status: None     Collection Time      03/16/14 11:12 AM       Result  Value  Ref Range  Status     MRSA by PCR  NEGATIVE   NEGATIVE  Final     Comment:                 The GeneXpert MRSA Assay (FDA        approved for NASAL specimens        only), is one component of a        comprehensive MRSA colonization        surveillance program. It is not        intended to diagnose MRSA        infection nor to guide or        monitor treatment for        MRSA infections.      Labs: Basic Metabolic Panel:   Recent Labs Lab  03/16/14 0113  03/17/14 0450  03/18/14 0800  03/20/14 0652   NA  141  141  142  143   K  4.2  4.0  4.7  4.0   CL  97  108  112  108   CO2  17*  20  20  24    GLUCOSE  118*  115*  92  100*   BUN  31*  32*  21  12   CREATININE  1.32*  1.11*  0.83  0.69   CALCIUM  10.6*  7.7*  7.5*  8.3*      Liver Function Tests:   Recent Labs Lab  03/16/14 0113  03/17/14 0450  03/18/14 0800   AST  32  25  36   ALT  42*  24  31   ALKPHOS  172*  82  144*   BILITOT  0.8  <0.2*  0.3   PROT  8.4*  4.9*  4.7*   ALBUMIN  4.1  2.2*  2.1*      CBC:   Recent Labs Lab  03/16/14 0113  03/17/14 0450  03/18/14 0800  03/18/14 1400  03/19/14 0540  03/20/14 0652   WBC  26.1*  12.1*  6.6  5.7  5.8  5.2   NEUTROABS  24.5*   --    --    --   3.9   --    HGB  12.1  8.1*  7.7*  7.8*  7.8*  8.3*   HCT  36.4  24.8*  23.9*  24.1*  23.6*  25.3*   MCV  103.1*  106.9*  106.7*  106.6*  103.5*  105.0*   PLT  239  157  186  199  221  235     TODAY: TODAY PATIENT SAYS THAT WHEN SHE FIRST GOT HOME AFTER HOSPITAL DISCHARGE, SHE FELT WEAK BUT OTHERWISE FELT OKAY. HOWEVER, THE PAST 2 DAYS SHE HAS DEVELOPED ANOREXIA/DECREASED APPETITE.  YESTERDAY DEVELOPED NAUSEA. VOMITED 4 TIMES YESTERDAY EVENING.  HAS NOT VOMITED ANY TODAY--IT IS NOW 9:30 A.M.  HAD VERY LITTLE DIARRHEA YESTERDAY. NO OTHER DIARRHEA. HAS HAD NO ABDOMINAL PAIN. NO OTHER SYMPTOMS.  NO FEVER OR CHILLS      Past Medical History  Diagnosis Date  . Hypertension   . Normal cardiac stress test     pt can't remember when or where  . Diabetes mellitus without  complication     Type 2 NIDDM x 15 years  . Arthritis     osteoarthritis  . Hyperlipidemia   . Hormone replacement therapy (postmenopausal)   . Fatty liver disease, nonalcoholic 0932    hospitalized, MRCP dx fatty liver  . Rheumatoid arthritis(714.0) 09/28/2012  . Obesity, unspecified 12/27/2012  . GERD (gastroesophageal reflux disease)   . Chronic cholecystitis with calculus 05/04/2013  . Unspecified essential hypertension 05/04/2013  . Diabetes type 2, controlled 05/04/2013  . Bursitis of right shoulder      Home Meds: Outpatient Prescriptions Prior to Visit  Medication Sig Dispense Refill  . amLODipine (NORVASC) 5 MG tablet Take 5 mg by mouth daily.      Marland Kitchen aspirin EC 81 MG tablet Take 81 mg by mouth daily.      Marland Kitchen atorvastatin (LIPITOR) 40 MG tablet Take 40 mg by mouth daily.      . Calcium Carbonate-Vitamin D (CALCIUM 600 + D PO) Take 1 tablet by mouth 2 (two) times daily.      . cloNIDine (CATAPRES) 0.3 MG tablet Take 1 tablet (0.3 mg total) by mouth 2 (two) times daily.  180 tablet  3  . cyanocobalamin 500 MCG tablet Take 1 tablet (500 mcg total) by mouth daily.  30 tablet  0  . diclofenac sodium (VOLTAREN) 1 % GEL Apply 3 g topically daily as needed (pain). To 3 large joints      . estradiol (ESTRACE) 0.5 MG tablet Take 0.5 mg by mouth 2 (two) times daily.      . fish oil-omega-3 fatty acids 1000 MG capsule Take 1 g by mouth at bedtime.       . folic acid (FOLVITE) 1 MG tablet Take 1 tablet by mouth 2 (two) times daily.       Marland Kitchen losartan-hydrochlorothiazide (HYZAAR) 100-12.5 MG per tablet Take 1 tablet by mouth daily.      . metFORMIN (GLUCOPHAGE) 1000 MG tablet Take 1,000 mg by mouth 2 (two) times daily with a meal.      . methotrexate (RHEUMATREX) 2.5 MG tablet Take 6 tablets by mouth once a week.       . metoprolol tartrate (LOPRESSOR) 25 MG tablet Take 12.5 mg by mouth 2 (two) times daily.      . Multiple Vitamins-Minerals (MULTIVITAMINS THER. W/MINERALS) TABS Take 1 tablet by  mouth daily.      Marland Kitchen omeprazole (PRILOSEC) 20 MG capsule Take 1 capsule (20 mg total) by mouth daily.  90 capsule  3  . ciprofloxacin (CIPRO) 500 MG tablet Take 1 tablet (500 mg total) by mouth 2 (two) times daily.  10 tablet  0   No facility-administered medications prior to visit.    Allergies:  Allergies  Allergen Reactions  . Monopril [Fosinopril] Cough  . Norvasc [Amlodipine Besylate]     10 mg dose causes LE edema.  Tolerates 5mg  dose.    History   Social History  . Marital Status: Divorced    Spouse Name: N/A    Number of Children: N/A  . Years of Education: N/A   Occupational History  . Not on file.   Social History Main Topics  . Smoking status: Former Smoker    Types: Cigarettes  . Smokeless tobacco:  Never Used     Comment: quit smoking regularly in her 30's; now has an occasional cigarette  . Alcohol Use: Yes     Comment: occasional  . Drug Use: No  . Sexual Activity: No   Other Topics Concern  . Not on file   Social History Narrative  . No narrative on file    Family History  Problem Relation Age of Onset  . Diabetes Other   . Heart disease Other   . Cancer Mother     breast  . Heart attack Mother   . Stroke Father      Review of Systems:  See HPI for pertinent ROS. All other ROS negative.    Physical Exam: Blood pressure 130/70, pulse 60, temperature 98.6 F (37 C), temperature source Oral, resp. rate 16, weight 148 lb (67.132 kg)., Body mass index is 30.94 kg/(m^2). General: WF. Appears thinner and weaker than usual. Appears in no acute distress. Neck: Supple. No thyromegaly. No lymphadenopathy. Lungs: Clear bilaterally to auscultation without wheezes, rales, or rhonchi. Breathing is unlabored. Heart: RRR with S1 S2. No murmurs, rubs, or gallops. Abdomen: Soft, non-tender, non-distended with normoactive bowel sounds. No hepatomegaly. No rebound/guarding. No obvious abdominal masses. Musculoskeletal:  Strength and tone normal for  age. Extremities/Skin: Warm and dry.  Neuro: Alert and oriented X 3. Moves all extremities spontaneously. Gait is normal. CNII-XII grossly in tact. Psych:  Responds to questions appropriately with a normal affect.     ASSESSMENT AND PLAN:  68 y.o. year old female with  1. Hospital discharge follow-up  2. Pulmonary nodules Hospitalist who discharged her from the hospital called me on the phone at the time of her discharge. He talked to me to make sure that I was aware to follow up with her as an outpatient --to followup pulmonary nodule. He recommended either a PET scan or a referral to pulmonology. Discussed with patient and we have opted to have her followup with pulmonary. She is agreeable to followup with them. Today I have shown her the CT scan findings and have discussed this at length. - Ambulatory referral to Pulmonology  3. Leukocytosis, unspecified On The discharge summary, hospitalist also specified for [place] to followup a CBC at her followup appointment. - CBC w/MCH & 3 Part Diff  4. Anemia, unspecified anemia type On the discharge summary, hospitalist also specified for [place] to follow up a CBC at her followup appointment. - CBC w/[facility] & 3 Part Diff  5. Acute renal insufficiency On the discharge summary, hospitalist also specified for [place] to followup at the med at her followup appointment. - BASIC METABOLIC PANEL WITH GFR  6. Nausea, Anorexia Given her recent anorexia, nausea, and vomiting--- I will run her CBC stat on our machine here in the office so that I can discuss the results with her in the office today prior to her leaving here.  Results for orders placed in visit on 03/27/14  CBC [facility] & 3 PART DIFF      Result Value Ref Range   WBC 6.6  4.0 - 10.5 K/uL   RBC 2.99 (*) 3.87 - 5.11 MIL/uL   Hemoglobin 10.3 (*) 12.0 - 15.0 g/dL   HCT 32.6 (*) 36.0 - 46.0 %   MCV 109.0 (*) 78.0 - 100.0 fL   MCH 34.4 (*) 26.0 - 34.0 pg   MCHC 31.6  30.0 - 36.0 g/dL   RDW 18.1 (*)  11.5 - 15.5 %   Platelets 304  150 -  400 K/uL   Neutrophils Relative % 73  43 - 77 %   Neutro Abs 4.8  1.7 - 7.7 K/uL   Lymphocytes Relative 21  12 - 46 %   Lymphs Abs 1.4  0.7 - 4.0 K/uL   WBC mixed population % 6  3 - 18 %   WBC mixed population 0.4  0.1 - 1.8 K/uL   I reviewed the above CBC with her in office today. Reviewed that WBC normal and that anemia much improved.  Will give her Phenergan to use PRN nausea for the next 1-2 days.  I discussed with her that we do not want to use medications to cover up an underlying problem. I told her to followup with [place] immediately if the nausea vomiting anorexia worsens or if it is not resolved in 2 days.  6. Nausea and vomiting, vomiting of unspecified type - promethazine (PHENERGAN) 25 MG tablet; Take 1 tablet (25 mg total) by mouth every 8 (eight) hours as needed for nausea or vomiting.  Dispense: 20 tablet; Refill: 0   Signed, [address] [place], [place], [facility] 03/27/2014 9:28 AM

## 2014-03-31 ENCOUNTER — Other Ambulatory Visit: Payer: Self-pay | Admitting: Physician Assistant

## 2014-04-01 NOTE — Telephone Encounter (Signed)
Refill appropriate and filled per protocol. 

## 2014-04-04 ENCOUNTER — Encounter: Payer: Self-pay | Admitting: Internal Medicine

## 2014-04-04 ENCOUNTER — Encounter (INDEPENDENT_AMBULATORY_CARE_PROVIDER_SITE_OTHER): Payer: Self-pay

## 2014-04-04 ENCOUNTER — Ambulatory Visit (INDEPENDENT_AMBULATORY_CARE_PROVIDER_SITE_OTHER): Payer: BC Managed Care – PPO | Admitting: Internal Medicine

## 2014-04-04 VITALS — BP 130/70 | HR 58 | Temp 97.7°F | Ht <= 58 in | Wt 143.0 lb

## 2014-04-04 DIAGNOSIS — R918 Other nonspecific abnormal finding of lung field: Secondary | ICD-10-CM

## 2014-04-04 HISTORY — DX: Other nonspecific abnormal finding of lung field: R91.8

## 2014-04-04 NOTE — Progress Notes (Signed)
   Subjective:    Patient ID: [name], female    DOB: 12/30/1945,    MRN: [id number]  HPI  42 yowf quit smoking 03/16/14 with recurrent epigastric pain requiring multiple abd ct's most recently 10/16/13 and 03/18/14 with incidental discovery of MPN's so referred to pulmonary clinic by Dena Billet  04/04/2014 1st [facility] office visit/ [name]  Chief Complaint  Patient presents with  . Pulmonary Consult    Referred per Dr Dena Billet for eval of pulmonary nodules. Pt states nodules were found incidentally on ct abd. No respiratory co's.   no cough, no sob, no cp. Has longstanding RA well controlled, no skin nodules   No  chest tightness, subjective wheeze overt sinus or hb symptoms. No unusual exp hx or h/o childhood pna/ asthma or knowledge of premature birth.  Sleeping ok without nocturnal  or early am exacerbation  of respiratory  c/o's or need for noct saba. Also denies any obvious fluctuation of symptoms with weather or environmental changes or other aggravating or alleviating factors except as outlined above   Current Medications, Allergies, Complete Past Medical History, Past Surgical History, Family History, and Social History were reviewed in [employer] record.          Review of Systems  Constitutional: Negative for fever, chills and unexpected weight change.  HENT: Negative for congestion, dental problem, ear pain, nosebleeds, postnasal drip, rhinorrhea, sinus pressure, sneezing, sore throat, trouble swallowing and voice change.   Eyes: Negative for visual disturbance.  Respiratory: Negative for cough, choking and shortness of breath.   Cardiovascular: Negative for chest pain and leg swelling.  Gastrointestinal: Negative for vomiting, abdominal pain and diarrhea.  Genitourinary: Negative for difficulty urinating.  Musculoskeletal: Negative for arthralgias.  Skin: Negative for rash.  Neurological: Negative for tremors, syncope and headaches.    Hematological: Does not bruise/bleed easily.       Objective:   Physical Exam amb wf nad  Wt Readings from Last 3 Encounters:  04/04/14 143 lb (64.864 kg)  03/27/14 148 lb (67.132 kg)  03/19/14 145 lb 11.2 oz (66.089 kg)      HEENT: nl dentition, turbinates, and orophanx. Nl external ear canals without cough reflex   NECK :  without JVD/Nodes/TM/ nl carotid upstrokes bilaterally   LUNGS: no acc muscle use, clear to A and P bilaterally without cough on insp or exp maneuvers   CV:  RRR  no s3 or murmur or increase in P2, no edema   ABD:  soft and nontender with nl excursion in the supine position. No bruits or organomegaly, bowel sounds nl  MS:  warm without deformities, calf tenderness, cyanosis or clubbing  SKIN: warm and dry without lesions    NEURO:  alert, approp, no deficits         Assessment & Plan:

## 2014-04-04 NOTE — Patient Instructions (Signed)
We will call you in 3 months to arrange follow up ct chest

## 2014-04-05 NOTE — Assessment & Plan Note (Addendum)
-   See CT chest 03/17/14 > not viz on abd CT from 10/16/13 as did not go high enough on lung slices >>>in tickle file for recall 06/17/14 as smoker with 9 mm nodule can't r/o ca and she is at risk. Doubt RA nodule but also in ddx - this plus size of lesion makes PET much less accurate -- Spirometry 04/04/14 wnl sp smoking cessation 02/2014  Comment: Discussed in detail all the  indications, usual  risks and alternatives  relative to the benefits with patient who agrees to proceed with repeat CT scan in 3 m and excisional bx if any growth as lung function excellent

## 2014-05-06 ENCOUNTER — Other Ambulatory Visit: Payer: Self-pay | Admitting: Physician Assistant

## 2014-05-22 ENCOUNTER — Encounter: Payer: Self-pay | Admitting: Physician Assistant

## 2014-05-22 ENCOUNTER — Ambulatory Visit (INDEPENDENT_AMBULATORY_CARE_PROVIDER_SITE_OTHER): Payer: BC Managed Care – PPO | Admitting: Physician Assistant

## 2014-05-22 VITALS — BP 114/80 | HR 56 | Temp 98.2°F | Resp 18 | Wt 137.0 lb

## 2014-05-22 DIAGNOSIS — K219 Gastro-esophageal reflux disease without esophagitis: Secondary | ICD-10-CM

## 2014-05-22 DIAGNOSIS — K760 Fatty (change of) liver, not elsewhere classified: Secondary | ICD-10-CM

## 2014-05-22 DIAGNOSIS — I10 Essential (primary) hypertension: Secondary | ICD-10-CM

## 2014-05-22 DIAGNOSIS — Z79890 Hormone replacement therapy (postmenopausal): Secondary | ICD-10-CM

## 2014-05-22 DIAGNOSIS — R918 Other nonspecific abnormal finding of lung field: Secondary | ICD-10-CM

## 2014-05-22 DIAGNOSIS — M069 Rheumatoid arthritis, unspecified: Secondary | ICD-10-CM

## 2014-05-22 DIAGNOSIS — E119 Type 2 diabetes mellitus without complications: Secondary | ICD-10-CM

## 2014-05-22 DIAGNOSIS — K7689 Other specified diseases of liver: Secondary | ICD-10-CM

## 2014-05-22 DIAGNOSIS — E785 Hyperlipidemia, unspecified: Secondary | ICD-10-CM

## 2014-05-22 DIAGNOSIS — D649 Anemia, unspecified: Secondary | ICD-10-CM

## 2014-05-22 LAB — CBC WITH DIFFERENTIAL/PLATELET
BASOS ABS: 0 10*3/uL (ref 0.0–0.1)
Basophils Relative: 0 % (ref 0–1)
EOS ABS: 0.1 10*3/uL (ref 0.0–0.7)
Eosinophils Relative: 2 % (ref 0–5)
HCT: 30.5 % — ABNORMAL LOW (ref 36.0–46.0)
Hemoglobin: 10.1 g/dL — ABNORMAL LOW (ref 12.0–15.0)
Lymphocytes Relative: 26 % (ref 12–46)
Lymphs Abs: 1.2 10*3/uL (ref 0.7–4.0)
MCH: 33 pg (ref 26.0–34.0)
MCHC: 33.1 g/dL (ref 30.0–36.0)
MCV: 99.7 fL (ref 78.0–100.0)
Monocytes Absolute: 0 10*3/uL — ABNORMAL LOW (ref 0.1–1.0)
Monocytes Relative: 1 % — ABNORMAL LOW (ref 3–12)
NEUTROS ABS: 3.3 10*3/uL (ref 1.7–7.7)
Neutrophils Relative %: 71 % (ref 43–77)
PLATELETS: 161 10*3/uL (ref 150–400)
RBC: 3.06 MIL/uL — ABNORMAL LOW (ref 3.87–5.11)
RDW: 16.4 % — ABNORMAL HIGH (ref 11.5–15.5)
WBC: 4.6 10*3/uL (ref 4.0–10.5)

## 2014-05-22 LAB — COMPLETE METABOLIC PANEL WITH GFR
ALK PHOS: 77 U/L (ref 39–117)
ALT: 14 U/L (ref 0–35)
AST: 19 U/L (ref 0–37)
Albumin: 3.7 g/dL (ref 3.5–5.2)
BILIRUBIN TOTAL: 0.6 mg/dL (ref 0.2–1.2)
BUN: 21 mg/dL (ref 6–23)
CO2: 25 mEq/L (ref 19–32)
Calcium: 9.1 mg/dL (ref 8.4–10.5)
Chloride: 105 mEq/L (ref 96–112)
Creat: 0.91 mg/dL (ref 0.50–1.10)
GFR, EST NON AFRICAN AMERICAN: 65 mL/min
GFR, Est African American: 74 mL/min
Glucose, Bld: 97 mg/dL (ref 70–99)
POTASSIUM: 4.4 meq/L (ref 3.5–5.3)
Sodium: 141 mEq/L (ref 135–145)
Total Protein: 6.1 g/dL (ref 6.0–8.3)

## 2014-05-22 LAB — LIPID PANEL
CHOL/HDL RATIO: 2.3 ratio
Cholesterol: 100 mg/dL (ref 0–200)
HDL: 44 mg/dL (ref >39)
LDL Cholesterol: 39 mg/dL (ref 0–99)
Triglycerides: 85 mg/dL (ref <150)
VLDL: 17 mg/dL (ref 0–40)

## 2014-05-22 LAB — HEMOGLOBIN A1C
HEMOGLOBIN A1C: 5.7 % — AB (ref <5.7)
Mean Plasma Glucose: 117 mg/dL — ABNORMAL HIGH (ref <117)

## 2014-05-22 NOTE — Progress Notes (Signed)
Patient ID: [name] MRN: [id number], DOB: 11/23/1945, 68 y.o. Date of Encounter: @DATE @  Chief Complaint:  Chief Complaint  Patient presents with  . 3 mth check up    is fasting, having flu shot next week free at work    HPI: 68 y.o. year old white female  presents for routine followup office visit.  Today I reviewed that she is actually had quite a bit going on in the past year with her health. Today I reviewed the last time she had just a routine office visit with no other issues going on with 07/04/2013. Even prior to that visit she had just recently undergone laparoscopic cholecystectomy. Then she had her routine visit 07/04/13 at which time things were stable.  Her next routine office visit with me was 11/21/2013. At that time, we reviewed that since her last visit with me, she had a long hospitalization with symptoms of mostly vomiting. Many many tests are performed. The patient stated that no definitive diagnosis could ever be confirmed and it was felt that she just has some nonspecific virus. Dates of that hospitalization for 10/15/13 - 10/21/13. At her visit 11/21/13 she reported that her symptoms have finally completely resolved and she was finally back to eating normal diet. She finally got her strength back and was back to walking almost every day for 30 minutes.  Her next followup office visit with me was 03/27/14. That office visit was to followup for recent hospitalization which the dates have been 03/16/14 - 03/19/14. The hospital discharge summary listed 2 specific recommendations for me to follow up: 1- Further workup of pulmonary nodule--at that visit as scheduled referral to pulmonary.  Today patient states that she has been seeing pulmonary and that she is scheduled for "another scan in November." 2-repeat BMP and CBC  BMET was basically normal.  CBC showed much improved anemia and WBC normal  During her hospitalization she had presented with complaints of  nausea/vomiting. Was found to have WBC of 26 and HR and 130s to 150s. She was started on aggressive IVF and empiric Vanc/Zosyn. She had chest x-ray and CT abdomen/pelvis.  At the hospital followup visit with me 03/27/14 she reported that when she first got home from the hospital she felt weak but otherwise felt okay. However the past 2 days prior to my office visit she had developed some anorexia and decreased appetite. Reported that the day prior to that visit she developed some nausea and vomited 4 times. Today she reports that all of those symptoms did completely resolve.  Today she reports that she has been feeling good and has gotten back to her walking. Says that she walks with her granddaughter and her baby. Says  they  "walk whenever they can"-- which usually is about 3-5 times per week. Has had no further problems with anorexia nausea vomiting diarrhea or abdominal pain.  She is taking her blood pressure medications as directed but no adverse effects. No lower extremity edema and no lightheadedness. She is taking her Lipitor as directed. No myalgias or other adverse effects. She does not check her blood sugar on a routine basis.   Past Medical History  Diagnosis Date  . Hypertension   . Normal cardiac stress test     pt can't remember when or where  . Diabetes mellitus without complication     Type 2 NIDDM x 15 years  . Arthritis     osteoarthritis  . Hyperlipidemia   . Hormone replacement  therapy (postmenopausal)   . Fatty liver disease, nonalcoholic 7989    hospitalized, MRCP dx fatty liver  . Rheumatoid arthritis(714.0) 09/28/2012  . Obesity, unspecified 12/27/2012  . GERD (gastroesophageal reflux disease)   . Chronic cholecystitis with calculus 05/04/2013  . Unspecified essential hypertension 05/04/2013  . Diabetes type 2, controlled 05/04/2013  . Bursitis of right shoulder      Home Meds: Outpatient Prescriptions Prior to Visit  Medication Sig Dispense Refill  . amLODipine  (NORVASC) 5 MG tablet Take 5 mg by mouth daily.      Marland Kitchen aspirin EC 81 MG tablet Take 81 mg by mouth daily.      Marland Kitchen atorvastatin (LIPITOR) 40 MG tablet Take 40 mg by mouth daily.      . Calcium Carbonate-Vitamin D (CALCIUM 600 + D PO) Take 1 tablet by mouth 2 (two) times daily.      . cloNIDine (CATAPRES) 0.3 MG tablet Take 1 tablet (0.3 mg total) by mouth 2 (two) times daily.  180 tablet  3  . diclofenac sodium (VOLTAREN) 1 % GEL Apply 3 g topically daily as needed (pain). To 3 large joints      . estradiol (ESTRACE) 0.5 MG tablet Take 0.5 mg by mouth 2 (two) times daily.      . fish oil-omega-3 fatty acids 1000 MG capsule Take 1 g by mouth at bedtime.       . folic acid (FOLVITE) 1 MG tablet Take 1 tablet by mouth 2 (two) times daily.       Marland Kitchen losartan-hydrochlorothiazide (HYZAAR) 100-12.5 MG per tablet TAKE 1 TABLET BY MOUTH DAILY.  90 tablet  0  . metFORMIN (GLUCOPHAGE) 1000 MG tablet Take 1,000 mg by mouth 2 (two) times daily with a meal.      . methotrexate (RHEUMATREX) 2.5 MG tablet Take 6 tablets by mouth once a week.       . metoprolol tartrate (LOPRESSOR) 25 MG tablet Take 12.5 mg by mouth 2 (two) times daily.      . Multiple Vitamins-Minerals (MULTIVITAMINS THER. W/MINERALS) TABS Take 1 tablet by mouth daily.      Marland Kitchen omeprazole (PRILOSEC) 20 MG capsule Take 1 capsule (20 mg total) by mouth daily.  90 capsule  3  . promethazine (PHENERGAN) 25 MG tablet Take 1 tablet (25 mg total) by mouth every 8 (eight) hours as needed for nausea or vomiting.  20 tablet  0  . cyanocobalamin 500 MCG tablet Take 1 tablet (500 mcg total) by mouth daily.  30 tablet  0  . amLODipine (NORVASC) 5 MG tablet TAKE 1 TABLET BY MOUTH EVERY DAY  90 tablet  0  . losartan-hydrochlorothiazide (HYZAAR) 100-12.5 MG per tablet TAKE 1 TABLET BY MOUTH DAILY.  90 tablet  0   No facility-administered medications prior to visit.    Allergies:  Allergies  Allergen Reactions  . Monopril [Fosinopril] Cough  . Norvasc  [Amlodipine Besylate]     10 mg dose causes LE edema.  Tolerates 5mg  dose.    History   Social History  . Marital Status: Divorced    Spouse Name: N/A    Number of Children: N/A  . Years of Education: N/A   Occupational History  . Not on file.   Social History Main Topics  . Smoking status: Former Smoker    Types: Cigarettes    Quit date: 11/21/2013  . Smokeless tobacco: Never Used     Comment: smoked regulary for approx 10 yrs- stopped in her  30's- but still smokes occ cig/Aug 2015  . Alcohol Use: Yes     Comment: occasional  . Drug Use: No  . Sexual Activity: No   Other Topics Concern  . Not on file   Social History Narrative  . No narrative on file    Family History  Problem Relation Age of Onset  . Diabetes Other   . Heart disease Other   . Cancer Mother     breast  . Heart attack Mother   . Stroke Father      Review of Systems:  See HPI for pertinent ROS. All other ROS negative.    Physical Exam: Blood pressure 114/80, pulse 56, temperature 98.2 F (36.8 C), temperature source Oral, resp. rate 18, weight 137 lb (62.143 kg)., Body mass index is 28.64 kg/(m^2). General: WNWD WF. Appears in no acute distress. Neck: Supple. No thyromegaly. No lymphadenopathy. No carotid bruit. Lungs: Clear bilaterally to auscultation without wheezes, rales, or rhonchi. Breathing is unlabored. Heart: RRR with S1 S2. No murmurs, rubs, or gallops. Abdomen: Soft, non-tender, non-distended with normoactive bowel sounds. No hepatomegaly. No rebound/guarding. No obvious abdominal masses. Musculoskeletal:  Strength and tone normal for age. Extremities/Skin: Warm and dry. Diabetic foot exam: Inspection is normal. No deformities, no ulcerations no areas of skin breakdown bilaterally. Sensation is intact throughout. 2+ posterior tibialis pulses bilaterally. No palpable dorsalis pedis pulses bilaterally. Neuro: Alert and oriented X 3. Moves all extremities spontaneously. Gait is normal.  CNII-XII grossly in tact. Psych:  Responds to questions appropriately with a normal affect.     ASSESSMENT AND PLAN:  68 y.o. year old female with  1. Diabetes type 2, controlled  Last microalbumin 11/21/2013  She is on aspirin 81 mg a day. She is on ARB. She is intolerant to ACE inhibitors secondary to cough. She is on statin. LDL has been at goal of less than 70.  She saw Ophthalmologist for diabetic eye exam in November 2014. Foot exam is normal. She reports that she has needed no neuropathy symptoms.  - COMPLETE METABOLIC PANEL WITH GFR - Hemoglobin A1c   2. Essential hypertension, benign Blood pressure at goal. On ARB. Intolerant to ACE inhibitor. - COMPLETE METABOLIC PANEL WITH GFR   3. Hyperlipidemia On Lipitor. - COMPLETE METABOLIC PANEL WITH GFR - Lipid panel   4. Fatty liver disease, nonalcoholic - COMPLETE METABOLIC PANEL WITH GFR   5. Anemia, unspecified anemia type - CBC with Differential  6. Hormone replacement therapy (postmenopausal) Per Gyn  7. Rheumatoid arthritis(714.0) Per Dr. Estanislado Pandy  8. Gastroesophageal reflux disease, esophagitis presence not specified  9. Multiple pulmonary nodules She has been following up with Dr. Melvyn Novas at pulmonary and says that she is scheduled for "another scan in November".  10. Preventive Care   Colonoscopy: 04/30/2011 colon polyp. With his repeat out of 10 years. She says she was told to repeat 10 years.    Pap smear, mammogram, and BMD : Sees GYN annually and may manage this.  At [place] 11/21/2013 she reported that they do the mammograms there in the gynecology office itself. [name] this is at [place]. I had her sign a release that day so that we could get those records to enter into our abstraction for quality metrics.    Immunizations:  Tdap: 05/2011  Pneumovax: 05/2011  Prevnar 13: 07/04/2013  Influenza vaccine she did receive this in the fall of 2014. Says that she is scheduled to get another  influenza vaccine at work for  free in just a couple weeks--to cover 2015-2016 Flu Season.  Zostavax: 09/2011  Routine followup office visit in 3 months or sooner if neede    Signed, [facility] [place], [place], [facility] 05/22/2014 10:02 AM

## 2014-05-23 ENCOUNTER — Ambulatory Visit: Payer: BC Managed Care – PPO | Admitting: Physician Assistant

## 2014-05-24 ENCOUNTER — Other Ambulatory Visit: Payer: Self-pay | Admitting: Physician Assistant

## 2014-05-24 MED ORDER — ATORVASTATIN CALCIUM 20 MG PO TABS: 20.0000 mg | ORAL_TABLET | Freq: Every day | ORAL | Status: DC

## 2014-05-24 MED ORDER — METFORMIN HCL 500 MG PO TABS: 500.0000 mg | ORAL_TABLET | Freq: Every day | ORAL | Status: DC

## 2014-05-24 NOTE — Telephone Encounter (Deleted)
Message copied by Olena Mater on Fri May 24, 2014  5:00 PM ------      Message from: Dena Billet      Created: Fri May 24, 2014  3:09 PM       1- decrease metformin to 500 mg one each morning      2- decrease Lipitor to 20 mg      Send new prescription for metformin 500 mg 1 po QD # 30+5      Send new prescription for Lipitor 20 mg 1 by mouth daily #30+5 ------

## 2014-05-24 NOTE — Telephone Encounter (Signed)
Have left patient phone message to call back about lab results and medication changes.  New meds to pharmacy

## 2014-05-24 NOTE — Telephone Encounter (Signed)
Medication refilled per protocol. 

## 2014-05-24 NOTE — Telephone Encounter (Signed)
Message copied by Olena Mater on Fri May 24, 2014  4:48 PM ------      Message from: Dena Billet      Created: Fri May 24, 2014  3:09 PM       1- decrease metformin to 500 mg one each morning      2- decrease Lipitor to 20 mg      Send new prescription for metformin 500 mg 1 po QD # 30+5      Send new prescription for Lipitor 20 mg 1 by mouth daily #30+5 ------

## 2014-05-27 NOTE — Telephone Encounter (Signed)
Message copied by Olena Mater on Mon May 27, 2014 12:55 PM ------      Message from: Dena Billet      Created: Fri May 24, 2014  3:09 PM       1- decrease metformin to 500 mg one each morning      2- decrease Lipitor to 20 mg      Send new prescription for metformin 500 mg 1 po QD # 30+5      Send new prescription for Lipitor 20 mg 1 by mouth daily #30+5 ------

## 2014-05-27 NOTE — Telephone Encounter (Deleted)
Message copied by Olena Mater on Mon May 27, 2014 12:54 PM ------      Message from: Dena Billet      Created: Fri May 24, 2014  3:09 PM       1- decrease metformin to 500 mg one each morning      2- decrease Lipitor to 20 mg      Send new prescription for metformin 500 mg 1 po QD # 30+5      Send new prescription for Lipitor 20 mg 1 by mouth daily #30+5 ------

## 2014-05-27 NOTE — Telephone Encounter (Signed)
Spoke to patient. Aware of medications changes and lab results

## 2014-06-17 ENCOUNTER — Other Ambulatory Visit: Payer: Self-pay | Admitting: Internal Medicine

## 2014-06-17 DIAGNOSIS — R918 Other nonspecific abnormal finding of lung field: Secondary | ICD-10-CM

## 2014-06-26 ENCOUNTER — Ambulatory Visit (INDEPENDENT_AMBULATORY_CARE_PROVIDER_SITE_OTHER)
Admission: RE | Admit: 2014-06-26 | Discharge: 2014-06-26 | Disposition: A | Discharge status: Home / Self Care | Payer: BC Managed Care – PPO | Source: Ambulatory Visit | Attending: Internal Medicine | Admitting: Internal Medicine

## 2014-06-26 DIAGNOSIS — R918 Other nonspecific abnormal finding of lung field: Secondary | ICD-10-CM

## 2014-06-27 ENCOUNTER — Ambulatory Visit: Payer: BC Managed Care – PPO | Admitting: Physician Assistant

## 2014-06-28 ENCOUNTER — Other Ambulatory Visit: Payer: Self-pay | Admitting: Obstetrics and Gynecology

## 2014-06-28 ENCOUNTER — Telehealth: Payer: Self-pay | Admitting: Internal Medicine

## 2014-06-28 ENCOUNTER — Ambulatory Visit: Payer: BC Managed Care – PPO | Admitting: Family Medicine

## 2014-06-28 DIAGNOSIS — R918 Other nonspecific abnormal finding of lung field: Secondary | ICD-10-CM

## 2014-06-28 LAB — HM MAMMOGRAPHY: HM MAMMO: NORMAL

## 2014-06-28 LAB — HM PAP SMEAR: HM PAP: NORMAL

## 2014-06-28 NOTE — Progress Notes (Signed)
Quick Note:  Called and spoke to pt. Informed pt of the results and recs per MW. Order placed. Pt verbalized understanding and denied any further questions or concerns at this time. ______ 

## 2014-06-28 NOTE — Telephone Encounter (Signed)
Called and spoke to pt. Informed pt of the results and recs per MW. Order placed. Pt verbalized understanding and denied any further questions or concerns at this time.    Notes Recorded by Doroteo Glassman, RN on 06/27/2014 at 10:31 AM [facility] x 1 ------  Notes Recorded by Tanda Rockers, MD on 06/26/2014 at 4:41 PM Call patient : Study is unchanged, radiology recs 6 m f/u scan so placed in tickle

## 2014-07-01 ENCOUNTER — Ambulatory Visit: Payer: BC Managed Care – PPO | Admitting: Physician Assistant

## 2014-07-01 LAB — CYTOLOGY - PAP

## 2014-07-04 ENCOUNTER — Encounter: Payer: Self-pay | Admitting: Family Medicine

## 2014-07-10 ENCOUNTER — Encounter: Payer: Self-pay | Admitting: Physician Assistant

## 2014-07-10 ENCOUNTER — Ambulatory Visit (INDEPENDENT_AMBULATORY_CARE_PROVIDER_SITE_OTHER): Payer: BC Managed Care – PPO | Admitting: Physician Assistant

## 2014-07-10 VITALS — BP 136/80 | HR 52 | Temp 98.3°F | Resp 18 | Wt 138.0 lb

## 2014-07-10 DIAGNOSIS — E669 Obesity, unspecified: Secondary | ICD-10-CM

## 2014-07-10 DIAGNOSIS — E119 Type 2 diabetes mellitus without complications: Secondary | ICD-10-CM

## 2014-07-10 DIAGNOSIS — K219 Gastro-esophageal reflux disease without esophagitis: Secondary | ICD-10-CM

## 2014-07-10 DIAGNOSIS — K760 Fatty (change of) liver, not elsewhere classified: Secondary | ICD-10-CM

## 2014-07-10 DIAGNOSIS — M179 Osteoarthritis of knee, unspecified: Secondary | ICD-10-CM

## 2014-07-10 DIAGNOSIS — M069 Rheumatoid arthritis, unspecified: Secondary | ICD-10-CM

## 2014-07-10 DIAGNOSIS — M1711 Unilateral primary osteoarthritis, right knee: Secondary | ICD-10-CM

## 2014-07-10 DIAGNOSIS — B009 Herpesviral infection, unspecified: Secondary | ICD-10-CM

## 2014-07-10 DIAGNOSIS — R918 Other nonspecific abnormal finding of lung field: Secondary | ICD-10-CM

## 2014-07-10 DIAGNOSIS — M7551 Bursitis of right shoulder: Secondary | ICD-10-CM

## 2014-07-10 DIAGNOSIS — I10 Essential (primary) hypertension: Secondary | ICD-10-CM

## 2014-07-10 DIAGNOSIS — E785 Hyperlipidemia, unspecified: Secondary | ICD-10-CM

## 2014-07-10 DIAGNOSIS — D649 Anemia, unspecified: Secondary | ICD-10-CM

## 2014-07-10 DIAGNOSIS — K529 Noninfective gastroenteritis and colitis, unspecified: Secondary | ICD-10-CM

## 2014-07-10 DIAGNOSIS — Z79890 Hormone replacement therapy (postmenopausal): Secondary | ICD-10-CM

## 2014-07-10 LAB — LIPID PANEL
Cholesterol: 132 mg/dL (ref 0–200)
HDL: 53 mg/dL
LDL Cholesterol: 62 mg/dL (ref 0–99)
Total CHOL/HDL Ratio: 2.5 ratio
Triglycerides: 86 mg/dL
VLDL: 17 mg/dL (ref 0–40)

## 2014-07-10 NOTE — Progress Notes (Signed)
Patient ID: [name] MRN: [id number], DOB: January 29, 1946, 68 y.o. Date of Encounter: @DATE @  Chief Complaint:  Chief Complaint  Patient presents with  . 3 mth check up    is fasting    HPI: 68 y.o. year old white female  presents for routine followup office visit.  I have reviewed that she is actually had quite a bit going on in the past year with her health. I reviewed the last time she had just a routine office visit with no other issues going on with 07/04/2013. Even prior to that visit she had just recently undergone laparoscopic cholecystectomy. Then she had her routine visit 07/04/13 at which time things were stable.  Her next routine office visit with me was 11/21/2013. At that time, we reviewed that since her last visit with me, she had a long hospitalization with symptoms of mostly vomiting. Many many tests are performed. The patient stated that no definitive diagnosis could ever be confirmed and it was felt that she just has some nonspecific virus. Dates of that hospitalization for 10/15/13 - 10/21/13. At her visit 11/21/13 she reported that her symptoms have finally completely resolved and she was finally back to eating normal diet. She finally got her strength back and was back to walking almost every day for 30 minutes.  Her next followup office visit with me was 03/27/14. That office visit was to followup for recent hospitalization which the dates have been 03/16/14 - 03/19/14. The hospital discharge summary listed 2 specific recommendations for me to follow up: 1- Further workup of pulmonary nodule--at that visit as scheduled referral to pulmonary.  Today patient states that she has been seeing pulmonary and that she is scheduled for "another scan in November." 2-repeat BMP and CBC  BMET was basically normal.  CBC showed much improved anemia and WBC normal  During her hospitalization she had presented with complaints of nausea/vomiting. Was found to have WBC of 26 and HR and 130s  to 150s. She was started on aggressive IVF and empiric Vanc/Zosyn. She had chest x-ray and CT abdomen/pelvis.  05/22/2014 she reported that she has been feeling good and has gotten back to her walking. [name] that she walks with her granddaughter and her baby. Says  they  "walk whenever they can"-- which usually is about 3-5 times per week.  Today she says this is the same---still walking same amount. Still feeling good.  She is taking her blood pressure medications as directed but no adverse effects. No lower extremity edema and no lightheadedness. I reviewed at her lab 05/22/14 LDL was 39 so I said to decrease Lipitor to 20 mg. She says that she did make this change and is taking the Lipitor at 20 mg. No myalgias or other adverse effects. Also lab 05/22/14 her A1c was 5.7 so I said to decrease metformin to 500 mg every morning.  She says that she did make this change as well and is taking the metformin at just 500 mg in the mornings only. She says that she still has not been checking her blood sugar on a routine basis. Says that she has had no symptoms of hypoglycemia. No episodes of weakness lightheadedness woodiness or shakiness.   Past Medical History  Diagnosis Date  . Hypertension   . Normal cardiac stress test     pt can't remember when or where  . Diabetes mellitus without complication     Type 2 NIDDM x 15 years  . Arthritis  osteoarthritis  . Hyperlipidemia   . Hormone replacement therapy (postmenopausal)   . Fatty liver disease, nonalcoholic 7902    hospitalized, MRCP dx fatty liver  . Rheumatoid arthritis(714.0) 09/28/2012  . Obesity, unspecified 12/27/2012  . GERD (gastroesophageal reflux disease)   . Chronic cholecystitis with calculus 05/04/2013  . Unspecified essential hypertension 05/04/2013  . Diabetes type 2, controlled 05/04/2013  . Bursitis of right shoulder      Home Meds: Outpatient Prescriptions Prior to Visit  Medication Sig Dispense Refill  . amLODipine  (NORVASC) 5 MG tablet Take 5 mg by mouth daily.    Marland Kitchen aspirin EC 81 MG tablet Take 81 mg by mouth daily.    Marland Kitchen atorvastatin (LIPITOR) 20 MG tablet Take 1 tablet (20 mg total) by mouth daily. 90 tablet 1  . Calcium Carbonate-Vitamin D (CALCIUM 600 + D PO) Take 1 tablet by mouth 2 (two) times daily.    . cloNIDine (CATAPRES) 0.3 MG tablet TAKE 1 TABLET TWICE A DAY 180 tablet 2  . cyanocobalamin 500 MCG tablet Take 1 tablet (500 mcg total) by mouth daily. 30 tablet 0  . diclofenac sodium (VOLTAREN) 1 % GEL Apply 3 g topically daily as needed (pain). To 3 large joints    . estradiol (ESTRACE) 0.5 MG tablet Take 0.5 mg by mouth 2 (two) times daily.    . fish oil-omega-3 fatty acids 1000 MG capsule Take 1 g by mouth at bedtime.     . folic acid (FOLVITE) 1 MG tablet Take 1 tablet by mouth 2 (two) times daily.     Marland Kitchen losartan-hydrochlorothiazide (HYZAAR) 100-12.5 MG per tablet TAKE 1 TABLET BY MOUTH DAILY. 90 tablet 0  . metFORMIN (GLUCOPHAGE) 500 MG tablet Take 1 tablet (500 mg total) by mouth daily with breakfast. 90 tablet 1  . methotrexate (RHEUMATREX) 2.5 MG tablet Take 6 tablets by mouth once a week.     . metoprolol tartrate (LOPRESSOR) 25 MG tablet Take 12.5 mg by mouth 2 (two) times daily.    . Multiple Vitamins-Minerals (MULTIVITAMINS THER. W/MINERALS) TABS Take 1 tablet by mouth daily.    Marland Kitchen omeprazole (PRILOSEC) 20 MG capsule TAKE 1 CAPSULE DAILY 90 capsule 2  . promethazine (PHENERGAN) 25 MG tablet Take 1 tablet (25 mg total) by mouth every 8 (eight) hours as needed for nausea or vomiting. 20 tablet 0   No facility-administered medications prior to visit.    Allergies:  Allergies  Allergen Reactions  . Monopril [Fosinopril] Cough  . Norvasc [Amlodipine Besylate]     10 mg dose causes LE edema.  Tolerates 5mg  dose.    History   Social History  . Marital Status: Divorced    Spouse Name: N/A    Number of Children: N/A  . Years of Education: N/A   Occupational History  . Not on  file.   Social History Main Topics  . Smoking status: Former Smoker    Types: Cigarettes    Quit date: 11/21/2013  . Smokeless tobacco: Never Used     Comment: smoked regulary for approx 10 yrs- stopped in her 52's- but still smokes occ cig/Aug 2015  . Alcohol Use: Yes     Comment: occasional  . Drug Use: No  . Sexual Activity: No   Other Topics Concern  . Not on file   Social History Narrative    Family History  Problem Relation Age of Onset  . Diabetes Other   . Heart disease Other   . Cancer Mother  breast  . Heart attack Mother   . Stroke Father      Review of Systems:  See HPI for pertinent ROS. All other ROS negative.    Physical Exam: Blood pressure 136/80, pulse 52, temperature 98.3 F (36.8 C), temperature source Oral, resp. rate 18, weight 138 lb (62.596 kg)., Body mass index is 28.85 kg/(m^2). General: WNWD WF. Appears in no acute distress. Neck: Supple. No thyromegaly. No lymphadenopathy. No carotid bruit. Lungs: Clear bilaterally to auscultation without wheezes, rales, or rhonchi. Breathing is unlabored. Heart: RRR with S1 S2. No murmurs, rubs, or gallops. Abdomen: Soft, non-tender, non-distended with normoactive bowel sounds. No hepatomegaly. No rebound/guarding. No obvious abdominal masses. Musculoskeletal:  Strength and tone normal for age. Extremities/Skin: Warm and dry. Diabetic foot exam: Inspection is normal. No deformities, no ulcerations no areas of skin breakdown bilaterally. Sensation is intact throughout. 2+ posterior tibialis pulses bilaterally. No palpable dorsalis pedis pulses bilaterally. Neuro: Alert and oriented X 3. Moves all extremities spontaneously. Gait is normal. CNII-XII grossly in tact. Psych:  Responds to questions appropriately with a normal affect.     ASSESSMENT AND PLAN:  68 y.o. year old female with  1. Diabetes type 2, controlled  Last microalbumin 11/21/2013  She is on aspirin 81 mg a day. She is on ARB. She is  intolerant to ACE inhibitors secondary to cough. She is on statin. LDL has been at goal of less than 70.  She saw Ophthalmologist for diabetic eye exam in November 2014. Foot exam is normal. She reports that she has needed no neuropathy symptoms.  - COMPLETE METABOLIC PANEL WITH GFR - Wait to repeat A1C--it has not been full 3 months since last A1C--done 05/22/14  Today I did give her a blood sugar log sheet and told her to check her blood sugar at different times different days some just to have an idea of how it's running. Told her to call me if she's getting any readings less than 80. Bring that log sheet with her to her next appointment.   2. Essential hypertension, benign Blood pressure at goal. On ARB. Intolerant to ACE inhibitor. - COMPLETE METABOLIC PANEL WITH GFR   3. Hyperlipidemia On Lipitor.decreased dose to 20 mg at last lab 05/22/14. - COMPLETE METABOLIC PANEL WITH GFR - Lipid panel   4. Fatty liver disease, nonalcoholic - COMPLETE METABOLIC PANEL WITH GFR   5. Anemia, unspecified anemia type - CBC was done 05/22/14 and was stable.  6. Hormone replacement therapy (postmenopausal) Per Gyn  7. Rheumatoid arthritis(714.0) Per Dr. Estanislado Pandy  8. Gastroesophageal reflux disease, esophagitis presence not specified  9. Multiple pulmonary nodules She has been following up with Dr. Melvyn Novas at pulmonary. At [place] 05/22/14 she said that she is scheduled for "another scan in November". At [place] 07/10/14--she had f/u scan November. Says there was no change and she is to f/u 6 months.   10. Preventive Care   Colonoscopy: 04/30/2011 colon polyp. With his repeat out of 10 years. She says she was told to repeat 10 years.    Pap smear, mammogram, and BMD : Sees GYN annually and may manage this.  At [place] 11/21/2013 she reported that they do the mammograms there in the gynecology office itself. [name] this is at [place]. I had her sign a release that day so that we could get those  records to enter into our abstraction for quality metrics.    Immunizations:  Tdap: 05/2011  Pneumovax: 05/2011  Prevnar 13: 07/04/2013  Influenza vaccine she did receive this in the fall of 2014. Says that she is scheduled to get another influenza vaccine at work for free in just a couple weeks--to cover 2015-2016 Flu Season.  Zostavax: 09/2011  Routine followup office visit in 3 months or sooner if needed.    Signed, [id number] [place], [place], [facility] 07/10/2014 9:02 AM

## 2014-07-16 ENCOUNTER — Encounter: Payer: Self-pay | Admitting: Family Medicine

## 2014-07-22 ENCOUNTER — Telehealth: Payer: Self-pay | Admitting: Family Medicine

## 2014-07-22 DIAGNOSIS — E785 Hyperlipidemia, unspecified: Secondary | ICD-10-CM

## 2014-07-22 DIAGNOSIS — I10 Essential (primary) hypertension: Secondary | ICD-10-CM

## 2014-07-22 MED ORDER — AMLODIPINE BESYLATE 5 MG PO TABS: 5.0000 mg | ORAL_TABLET | Freq: Every day | ORAL | Status: DC

## 2014-07-22 MED ORDER — ATORVASTATIN CALCIUM 20 MG PO TABS: 20.0000 mg | ORAL_TABLET | Freq: Every day | ORAL | Status: DC

## 2014-07-22 MED ORDER — LOSARTAN POTASSIUM-HCTZ 100-12.5 MG PO TABS: 1.0000 | ORAL_TABLET | Freq: Every day | ORAL | Status: DC

## 2014-07-22 NOTE — Telephone Encounter (Signed)
Medication refilled per protocol. 

## 2014-08-21 ENCOUNTER — Ambulatory Visit: Payer: BC Managed Care – PPO | Admitting: Family Medicine

## 2014-09-15 ENCOUNTER — Other Ambulatory Visit: Payer: Self-pay | Admitting: Physician Assistant

## 2014-09-30 ENCOUNTER — Telehealth: Payer: Self-pay | Admitting: Family Medicine

## 2014-09-30 MED ORDER — METFORMIN HCL 500 MG PO TABS: 500.0000 mg | ORAL_TABLET | Freq: Every day | ORAL | Status: DC

## 2014-09-30 NOTE — Telephone Encounter (Signed)
Medication refilled per protocol. 

## 2014-10-08 LAB — HM DIABETES EYE EXAM

## 2014-10-10 ENCOUNTER — Telehealth: Payer: Self-pay | Admitting: Family Medicine

## 2014-10-10 ENCOUNTER — Encounter: Payer: Self-pay | Admitting: Physician Assistant

## 2014-10-10 ENCOUNTER — Ambulatory Visit (INDEPENDENT_AMBULATORY_CARE_PROVIDER_SITE_OTHER): Payer: BLUE CROSS/BLUE SHIELD | Admitting: Physician Assistant

## 2014-10-10 VITALS — BP 142/74 | HR 60 | Temp 97.9°F | Resp 18 | Wt 151.0 lb

## 2014-10-10 DIAGNOSIS — K760 Fatty (change of) liver, not elsewhere classified: Secondary | ICD-10-CM

## 2014-10-10 DIAGNOSIS — K219 Gastro-esophageal reflux disease without esophagitis: Secondary | ICD-10-CM

## 2014-10-10 DIAGNOSIS — E669 Obesity, unspecified: Secondary | ICD-10-CM

## 2014-10-10 DIAGNOSIS — E119 Type 2 diabetes mellitus without complications: Secondary | ICD-10-CM

## 2014-10-10 DIAGNOSIS — I10 Essential (primary) hypertension: Secondary | ICD-10-CM

## 2014-10-10 DIAGNOSIS — E785 Hyperlipidemia, unspecified: Secondary | ICD-10-CM

## 2014-10-10 DIAGNOSIS — Z79890 Hormone replacement therapy (postmenopausal): Secondary | ICD-10-CM

## 2014-10-10 LAB — HEMOGLOBIN A1C, FINGERSTICK: HEMOGLOBIN A1C, FINGERSTICK: 5.7 % (ref <5.7)

## 2014-10-10 NOTE — Telephone Encounter (Signed)
Pt aware of lab results and provider recommendations.  Metformin removed from med list

## 2014-10-10 NOTE — Progress Notes (Signed)
Patient ID: Jourden Delmont MRN: [id number], DOB: 01-Dec-1945, 69 y.o. Date of Encounter: @DATE @  Chief Complaint:  Chief Complaint  Patient presents with  . 3 mth check up    is fasting    HPI: 69 y.o. year old white female  presents for routine followup office visit.  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  I have reviewed that she is actually had quite a bit going on in the past year with her health. I reviewed the last time she had just a routine office visit with no other issues going on with 07/04/2013. Even prior to that visit she had just recently undergone laparoscopic cholecystectomy. Then she had her routine visit 07/04/13 at which time things were stable.  Her next routine office visit with me was 11/21/2013. At that time, we reviewed that since her last visit with me, she had a long hospitalization with symptoms of mostly vomiting. Many many tests are performed. The patient stated that no definitive diagnosis could ever be confirmed and it was felt that she just has some nonspecific virus. Dates of that hospitalization for 10/15/13 - 10/21/13. At her visit 11/21/13 she reported that her symptoms have finally completely resolved and she was finally back to eating normal diet. She finally got her strength back and was back to walking almost every day for 30 minutes.  Her next followup office visit with me was 03/27/14. That office visit was to followup for recent hospitalization which the dates have been 03/16/14 - 03/19/14. The hospital discharge summary listed 2 specific recommendations for me to follow up: 1- Further workup of pulmonary nodule--at that visit as scheduled referral to pulmonary.  Today patient states that she has been seeing pulmonary and that she is scheduled for "another  scan in November." 2-repeat BMP and CBC  BMET was basically normal.  CBC showed much improved anemia and WBC normal  During her hospitalization she had presented with complaints of nausea/vomiting. Was found to have WBC of 26 and HR and 130s to 150s. She was started on aggressive IVF and empiric Vanc/Zosyn. She had chest x-ray and CT abdomen/pelvis.  05/22/2014 she reported that she has been feeling good and has gotten back to her walking. [name] that she walks with her granddaughter and her baby. Says  they  "walk whenever they can"-- which usually is about 3-5 times per week.  Today she says this is the same---still walking same amount. Still feeling good.  She is taking her blood pressure medications as directed but no adverse effects. No lower extremity edema and no lightheadedness. I reviewed at her lab 05/22/14 LDL was 39 so I said to decrease Lipitor to 20 mg. She says that she did make this change and is taking the Lipitor at 20 mg. No myalgias or other adverse effects. Also lab 05/22/14 her A1c was 5.7 so I said to decrease metformin to 500 mg every morning.  She says that she did make this change as well and is taking the metformin at just 500 mg in the mornings only. She says that she still has not been checking her blood sugar on a routine basis. Says that she has had no symptoms of hypoglycemia. No episodes of weakness, lightheadedness, sweatiness, or shakiness.   Past Medical  History  Diagnosis Date  . Hypertension   . Normal cardiac stress test     pt can't remember when or where  . Diabetes mellitus without complication     Type 2 NIDDM x 15 years  . Arthritis     osteoarthritis  . Hyperlipidemia   . Hormone replacement therapy (postmenopausal)   . Fatty liver disease, nonalcoholic 7035    hospitalized, MRCP dx fatty liver  . Rheumatoid arthritis(714.0) 09/28/2012  . Obesity, unspecified 12/27/2012  . GERD (gastroesophageal reflux disease)   . Chronic cholecystitis with  calculus 05/04/2013  . Unspecified essential hypertension 05/04/2013  . Diabetes type 2, controlled 05/04/2013  . Bursitis of right shoulder      Home Meds: Outpatient Prescriptions Prior to Visit  Medication Sig Dispense Refill  . amLODipine (NORVASC) 5 MG tablet Take 1 tablet (5 mg total) by mouth daily. 90 tablet 1  . aspirin EC 81 MG tablet Take 81 mg by mouth daily.    Marland Kitchen atorvastatin (LIPITOR) 20 MG tablet Take 1 tablet (20 mg total) by mouth daily at 6 PM. 90 tablet 1  . Calcium Carbonate-Vitamin D (CALCIUM 600 + D PO) Take 1 tablet by mouth 2 (two) times daily.    . cloNIDine (CATAPRES) 0.3 MG tablet TAKE 1 TABLET TWICE A DAY 180 tablet 2  . cyanocobalamin 500 MCG tablet Take 1 tablet (500 mcg total) by mouth daily. 30 tablet 0  . diclofenac sodium (VOLTAREN) 1 % GEL Apply 3 g topically daily as needed (pain). To 3 large joints    . estradiol (ESTRACE) 0.5 MG tablet Take 0.5 mg by mouth 2 (two) times daily.    . fish oil-omega-3 fatty acids 1000 MG capsule Take 1 g by mouth at bedtime.     . folic acid (FOLVITE) 1 MG tablet Take 1 tablet by mouth 2 (two) times daily.     Marland Kitchen losartan-hydrochlorothiazide (HYZAAR) 100-12.5 MG per tablet Take 1 tablet by mouth daily. 90 tablet 1  . metFORMIN (GLUCOPHAGE) 500 MG tablet Take 1 tablet (500 mg total) by mouth daily with breakfast. 90 tablet 1  . methotrexate (RHEUMATREX) 2.5 MG tablet Take 6 tablets by mouth once a week.     . metoprolol tartrate (LOPRESSOR) 25 MG tablet TAKE ONE-HALF (1/2) TABLET TWICE A DAY 90 tablet 1  . Multiple Vitamins-Minerals (MULTIVITAMINS THER. W/MINERALS) TABS Take 1 tablet by mouth daily.    Marland Kitchen omeprazole (PRILOSEC) 20 MG capsule TAKE 1 CAPSULE DAILY 90 capsule 2  . promethazine (PHENERGAN) 25 MG tablet Take 1 tablet (25 mg total) by mouth every 8 (eight) hours as needed for nausea or vomiting. 20 tablet 0  . metoprolol tartrate (LOPRESSOR) 25 MG tablet Take 12.5 mg by mouth 2 (two) times daily.     No  facility-administered medications prior to visit.    Allergies:  Allergies  Allergen Reactions  . Monopril [Fosinopril] Cough  . Norvasc [Amlodipine Besylate]     10 mg dose causes LE edema.  Tolerates 5mg  dose.    History   Social History  . Marital Status: Divorced    Spouse Name: N/A  . Number of Children: N/A  . Years of Education: N/A   Occupational History  . Not on file.   Social History Main Topics  . Smoking status: Former Smoker    Types: Cigarettes    Quit date: 11/21/2013  . Smokeless tobacco: Never Used     Comment: smoked regulary for approx 10 yrs- stopped in  her 42's- but still smokes occ cig/Aug 2015  . Alcohol Use: Yes     Comment: occasional  . Drug Use: No  . Sexual Activity: No   Other Topics Concern  . Not on file   Social History Narrative    Family History  Problem Relation Age of Onset  . Diabetes Other   . Heart disease Other   . Cancer Mother     breast  . Heart attack Mother   . Stroke Father      Review of Systems:  See HPI for pertinent ROS. All other ROS negative.    Physical Exam: Blood pressure 142/74, pulse 60, temperature 97.9 F (36.6 C), temperature source Oral, resp. rate 18, weight 151 lb (68.493 kg)., Body mass index is 31.57 kg/(m^2). General: WNWD WF. Appears in no acute distress. Neck: Supple. No thyromegaly. No lymphadenopathy. No carotid bruit. Lungs: Clear bilaterally to auscultation without wheezes, rales, or rhonchi. Breathing is unlabored. Heart: RRR with S1 S2. No murmurs, rubs, or gallops. Abdomen: Soft, non-tender, non-distended with normoactive bowel sounds. No hepatomegaly. No rebound/guarding. No obvious abdominal masses. Musculoskeletal:  Strength and tone normal for age. Extremities/Skin: Warm and dry. Diabetic foot exam: Inspection is normal. No deformities, no ulcerations no areas of skin breakdown bilaterally. Sensation is intact throughout. 2+ posterior tibialis pulses bilaterally. No palpable  dorsalis pedis pulses bilaterally. Neuro: Alert and oriented X 3. Moves all extremities spontaneously. Gait is normal. CNII-XII grossly in tact. Psych:  Responds to questions appropriately with a normal affect.     ASSESSMENT AND PLAN:  69 y.o. year old female with  1. Diabetes type 2, controlled  Last microalbumin 11/21/2013  She is on aspirin 81 mg a day. She is on ARB. She is intolerant to ACE inhibitors secondary to cough. She is on statin. LDL has been at goal of less than 70.  She saw Ophthalmologist for diabetic eye exam in November 2014. Foot exam is normal. She reports that she has needed no neuropathy symptoms.  - COMPLETE METABOLIC PANEL WITH GFR - Wait to repeat A1C--it has not been full 3 months since last A1C--done 05/22/14  Today I did give her a blood sugar log sheet and told her to check her blood sugar at different times different days some just to have an idea of how it's running. Told her to call me if she's getting any readings less than 80. Bring that log sheet with her to her next appointment.   2. Essential hypertension, benign Blood pressure at goal. On ARB. Intolerant to ACE inhibitor.   3. Hyperlipidemia On Lipitor.At Lab Result 05/22/14--decreased Lipitor dose to 20 mg . F/U  FLP 07/10/14---FLP Excellent.    4. Fatty liver disease, nonalcoholic   5. Anemia, unspecified anemia type - CBC was done 05/22/14 and was stable.  6. Hormone replacement therapy (postmenopausal) Per Gyn  7. Rheumatoid arthritis(714.0) Per Dr. Estanislado Pandy  8. Gastroesophageal reflux disease, esophagitis presence not specified  9. Multiple pulmonary nodules She has been following up with Dr. Melvyn Novas at pulmonary. At [place] 05/22/14 she said that she is scheduled for "another scan in November". At [place] 07/10/14--she had f/u scan November. Says there was no change and she is to f/u 6 months.   10. Preventive Care   Colonoscopy: 04/30/2011 colon polyp. With his repeat out of 10 years.  She says she was told to repeat 10 years.    Pap smear, mammogram, and BMD : Sees GYN annually and may manage this.  At [place] 11/21/2013 she reported that they do the mammograms there in the gynecology office itself. [name] this is at [place]. I had her sign a release that day so that we could get those records to enter into our abstraction for quality metrics.    Immunizations:  Tdap: 05/2011  Pneumovax: 05/2011  Prevnar 13: 07/04/2013  Influenza vaccine she did receive this in the fall of 2014. Says she received influenza vaccine at work for free ---to cover 2015-2016 Flu Season.  Zostavax: 09/2011  Routine followup office visit in 3 months or sooner if needed.    Marin Olp [place], [place], [facility] 10/10/2014 8:43 AM

## 2014-10-10 NOTE — Telephone Encounter (Signed)
-----   Message from Orlena Sheldon, PA-C sent at 10/10/2014 12:33 PM EST ----- A1C still low.  Tell patient to stop the metformin altogether. Remove metformin from medication list.

## 2014-10-15 ENCOUNTER — Encounter: Payer: Self-pay | Admitting: Family Medicine

## 2014-10-15 DIAGNOSIS — E119 Type 2 diabetes mellitus without complications: Secondary | ICD-10-CM | POA: Insufficient documentation

## 2014-10-15 DIAGNOSIS — Z0100 Encounter for examination of eyes and vision without abnormal findings: Principal | ICD-10-CM

## 2014-12-25 ENCOUNTER — Ambulatory Visit (INDEPENDENT_AMBULATORY_CARE_PROVIDER_SITE_OTHER)
Admission: RE | Admit: 2014-12-25 | Discharge: 2014-12-25 | Disposition: A | Discharge status: Home / Self Care | Payer: BLUE CROSS/BLUE SHIELD | Source: Ambulatory Visit | Attending: Internal Medicine | Admitting: Internal Medicine

## 2014-12-25 DIAGNOSIS — R918 Other nonspecific abnormal finding of lung field: Secondary | ICD-10-CM

## 2014-12-26 ENCOUNTER — Other Ambulatory Visit: Payer: Self-pay | Admitting: Internal Medicine

## 2014-12-26 DIAGNOSIS — R918 Other nonspecific abnormal finding of lung field: Secondary | ICD-10-CM

## 2014-12-27 ENCOUNTER — Other Ambulatory Visit: Payer: BC Managed Care – PPO

## 2015-01-09 ENCOUNTER — Ambulatory Visit (INDEPENDENT_AMBULATORY_CARE_PROVIDER_SITE_OTHER): Payer: BLUE CROSS/BLUE SHIELD | Admitting: Physician Assistant

## 2015-01-09 ENCOUNTER — Encounter: Payer: Self-pay | Admitting: Physician Assistant

## 2015-01-09 VITALS — BP 110/60 | HR 68 | Temp 98.0°F | Resp 19 | Wt 157.0 lb

## 2015-01-09 DIAGNOSIS — K760 Fatty (change of) liver, not elsewhere classified: Secondary | ICD-10-CM | POA: Diagnosis not present

## 2015-01-09 DIAGNOSIS — E785 Hyperlipidemia, unspecified: Secondary | ICD-10-CM | POA: Diagnosis not present

## 2015-01-09 DIAGNOSIS — E669 Obesity, unspecified: Secondary | ICD-10-CM | POA: Diagnosis not present

## 2015-01-09 DIAGNOSIS — Z0100 Encounter for examination of eyes and vision without abnormal findings: Secondary | ICD-10-CM | POA: Diagnosis not present

## 2015-01-09 DIAGNOSIS — M069 Rheumatoid arthritis, unspecified: Secondary | ICD-10-CM | POA: Diagnosis not present

## 2015-01-09 DIAGNOSIS — Z79890 Hormone replacement therapy (postmenopausal): Secondary | ICD-10-CM

## 2015-01-09 DIAGNOSIS — M1711 Unilateral primary osteoarthritis, right knee: Secondary | ICD-10-CM

## 2015-01-09 DIAGNOSIS — D649 Anemia, unspecified: Secondary | ICD-10-CM | POA: Diagnosis not present

## 2015-01-09 DIAGNOSIS — M179 Osteoarthritis of knee, unspecified: Secondary | ICD-10-CM

## 2015-01-09 DIAGNOSIS — K219 Gastro-esophageal reflux disease without esophagitis: Secondary | ICD-10-CM

## 2015-01-09 DIAGNOSIS — R918 Other nonspecific abnormal finding of lung field: Secondary | ICD-10-CM | POA: Diagnosis not present

## 2015-01-09 DIAGNOSIS — E119 Type 2 diabetes mellitus without complications: Secondary | ICD-10-CM | POA: Diagnosis not present

## 2015-01-09 DIAGNOSIS — I10 Essential (primary) hypertension: Secondary | ICD-10-CM | POA: Diagnosis not present

## 2015-01-09 LAB — COMPLETE METABOLIC PANEL WITH GFR
ALBUMIN: 3.6 g/dL (ref 3.5–5.2)
ALK PHOS: 99 U/L (ref 39–117)
ALT: 20 U/L (ref 0–35)
AST: 28 U/L (ref 0–37)
BUN: 23 mg/dL (ref 6–23)
CALCIUM: 9.3 mg/dL (ref 8.4–10.5)
CHLORIDE: 104 meq/L (ref 96–112)
CO2: 28 mEq/L (ref 19–32)
Creat: 1.06 mg/dL (ref 0.50–1.10)
GFR, Est African American: 61 mL/min
GFR, Est Non African American: 53 mL/min — ABNORMAL LOW
Glucose, Bld: 130 mg/dL — ABNORMAL HIGH (ref 70–99)
POTASSIUM: 4.2 meq/L (ref 3.5–5.3)
SODIUM: 141 meq/L (ref 135–145)
Total Bilirubin: 0.5 mg/dL (ref 0.2–1.2)
Total Protein: 6.1 g/dL (ref 6.0–8.3)

## 2015-01-09 LAB — CBC WITH DIFFERENTIAL/PLATELET
Basophils Absolute: 0 K/uL (ref 0.0–0.1)
Basophils Relative: 0 % (ref 0–1)
Eosinophils Absolute: 0.2 K/uL (ref 0.0–0.7)
Eosinophils Relative: 4 % (ref 0–5)
HCT: 33.1 % — ABNORMAL LOW (ref 36.0–46.0)
Hemoglobin: 11.2 g/dL — ABNORMAL LOW (ref 12.0–15.0)
Lymphocytes Relative: 27 % (ref 12–46)
Lymphs Abs: 1.2 K/uL (ref 0.7–4.0)
MCH: 33 pg (ref 26.0–34.0)
MCHC: 33.8 g/dL (ref 30.0–36.0)
MCV: 97.6 fL (ref 78.0–100.0)
MPV: 11 fL (ref 8.6–12.4)
Monocytes Absolute: 0.2 K/uL (ref 0.1–1.0)
Monocytes Relative: 4 % (ref 3–12)
Neutro Abs: 2.8 K/uL (ref 1.7–7.7)
Neutrophils Relative %: 65 % (ref 43–77)
Platelets: 225 K/uL (ref 150–400)
RBC: 3.39 MIL/uL — ABNORMAL LOW (ref 3.87–5.11)
RDW: 15.1 % (ref 11.5–15.5)
WBC: 4.3 K/uL (ref 4.0–10.5)

## 2015-01-09 LAB — LIPID PANEL
CHOLESTEROL: 143 mg/dL (ref 0–200)
HDL: 66 mg/dL (ref >=46)
LDL Cholesterol: 55 mg/dL (ref 0–99)
TRIGLYCERIDES: 109 mg/dL (ref <150)
Total CHOL/HDL Ratio: 2.2 Ratio
VLDL: 22 mg/dL (ref 0–40)

## 2015-01-09 LAB — HEMOGLOBIN A1C
HEMOGLOBIN A1C: 6.5 % — AB (ref <5.7)
Mean Plasma Glucose: 140 mg/dL — ABNORMAL HIGH (ref <117)

## 2015-01-09 LAB — MICROALBUMIN, URINE: Microalb, Ur: 1.3 mg/dL

## 2015-01-09 NOTE — Progress Notes (Addendum)
Patient ID: Lilinoe Acklin MRN: [id number], DOB: [birth year], 69 y.o. Date of Encounter: @DATE @  Chief Complaint:  Chief Complaint  Patient presents with  . follow up    HPI: 69 y.o. year old white female  presents for routine followup office visit.   Rheumatoid arthritis(714.0) Per Dr. Estanislado Pandy ADDENDUM--ADDED 05/21/2015: I received office note from Dr. Estanislado Pandy dated 05/16/15. The following is copied from that [place] note: "Pt reports that she has been experiencing increased pain and discomfort in multiple joints. She has pain in her bilateral shoulders, bilateral wrist joints and hands. Tenderness on palpation over her MCPs and PIPs. Also has thickening of bilateral CMC joints due to underlying osteoarthritis. She has swelling and warmth over bilateral ankle joints. She appears to be having a flare of her rheumatoid arthritis. She is on high risk prescription, taking methotrexate 6 tablets per week, which is not working as well for her. She could not increase her dose of methotrexate any further. She does have osteoarthritis, which causes some discomfort also. Different treatment options and their side effects were discussed at length. Discussed option of using combination therapy and adding plaquenil to her regimen. She was prescribed Plaquenil 200 mg 1 by mouth daily 90 day supply with 1 refill. Was advised to get eye exams to monitor for ocular toxicity. Orders given for labs every 3 months to monitor drug toxicity. They also discussed that most likely she would have to get on more aggressive therapy than just methotrexate and Plaquenil in the future. To cover that she checked TB cold, HIV, SPEP, immunoglobulins. Dr. Estanislado Pandy noted she is anemic and told her to follow-up with her PCP regarding that.   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  She had a lot of medical problems in 2014-2015.   Approx 06/2013 she had laparoscopic cholecystectomy.   Her next  routine office visit with me was 11/21/2013. She had a long hospitalization---10/15/13-10/21/2013-- with symptoms of mostly vomiting. Many many tests are performed. The patient stated that no definitive diagnosis could ever be confirmed and it was felt that she just has some nonspecific virus.  She had another hospitalization  03/16/14 - 03/19/14. During that hospitalization, she was found to have a pulmonary nodule. Since then she has been having follow-up with Dr. Melvyn Novas.    05/22/2014 she reported that she has been feeling good and has gotten back to her walking. [name] that she walks with her granddaughter and her baby. Says  they  "walk whenever they can"-- which usually is about 3-5 times per week. At all of her visits with me since then, she has continued to tell me that she has continued to do these walks with her granddaughter and her baby whenever the weather is good. Again at her visit today 12/2014 she reports this.  Today she says this is the same---still walking same amount. Still feeling good.  She is taking her blood pressure medications as directed but no adverse effects. No lower extremity edema and no lightheadedness. She is taking Lipitor at 20 mg. No myalgias or other adverse effects. Last A1C 10/10/14--A1C was down to 5.7. Metformin was discontinued completely. She says that she did stop this. She says that she has not been checking her blood sugar.Says that they did have a screening at her work and at that time her fasting sugar was 115.  Past Medical History  Diagnosis Date  . Hypertension   . Normal cardiac stress test  pt can't remember when or where  . Diabetes mellitus without complication     Type 2 NIDDM x 15 years  . Arthritis     osteoarthritis  . Hyperlipidemia   . Hormone replacement therapy (postmenopausal)   . Fatty liver disease, nonalcoholic 7591    hospitalized, MRCP dx fatty liver  . Rheumatoid arthritis(714.0) 09/28/2012  . Obesity, unspecified 12/27/2012  .  GERD (gastroesophageal reflux disease)   . Chronic cholecystitis with calculus 05/04/2013  . Unspecified essential hypertension 05/04/2013  . Diabetes type 2, controlled 05/04/2013  . Bursitis of right shoulder      Home Meds: Outpatient Prescriptions Prior to Visit  Medication Sig Dispense Refill  . amLODipine (NORVASC) 5 MG tablet Take 1 tablet (5 mg total) by mouth daily. 90 tablet 1  . aspirin EC 81 MG tablet Take 81 mg by mouth daily.    Marland Kitchen atorvastatin (LIPITOR) 20 MG tablet Take 1 tablet (20 mg total) by mouth daily at 6 PM. 90 tablet 1  . Calcium Carbonate-Vitamin D (CALCIUM 600 + D PO) Take 1 tablet by mouth 2 (two) times daily.    . cloNIDine (CATAPRES) 0.3 MG tablet TAKE 1 TABLET TWICE A DAY 180 tablet 2  . cyanocobalamin 500 MCG tablet Take 1 tablet (500 mcg total) by mouth daily. 30 tablet 0  . diclofenac sodium (VOLTAREN) 1 % GEL Apply 3 g topically daily as needed (pain). To 3 large joints    . estradiol (ESTRACE) 0.5 MG tablet Take 0.5 mg by mouth 2 (two) times daily.    . fish oil-omega-3 fatty acids 1000 MG capsule Take 1 g by mouth at bedtime.     . folic acid (FOLVITE) 1 MG tablet Take 1 tablet by mouth 2 (two) times daily.     Marland Kitchen losartan-hydrochlorothiazide (HYZAAR) 100-12.5 MG per tablet Take 1 tablet by mouth daily. 90 tablet 1  . methotrexate (RHEUMATREX) 2.5 MG tablet Take 6 tablets by mouth once a week.     . metoprolol tartrate (LOPRESSOR) 25 MG tablet TAKE ONE-HALF (1/2) TABLET TWICE A DAY 90 tablet 1  . Multiple Vitamins-Minerals (MULTIVITAMINS THER. W/MINERALS) TABS Take 1 tablet by mouth daily.    Marland Kitchen omeprazole (PRILOSEC) 20 MG capsule TAKE 1 CAPSULE DAILY 90 capsule 2  . promethazine (PHENERGAN) 25 MG tablet Take 1 tablet (25 mg total) by mouth every 8 (eight) hours as needed for nausea or vomiting. (Patient not taking: Reported on 01/09/2015) 20 tablet 0   No facility-administered medications prior to visit.    Allergies:  Allergies  Allergen Reactions  .  Monopril [Fosinopril] Cough  . Norvasc [Amlodipine Besylate]     10 mg dose causes LE edema.  Tolerates 5mg  dose.    History   Social History  . Marital Status: Divorced    Spouse Name: N/A  . Number of Children: N/A  . Years of Education: N/A   Occupational History  . Not on file.   Social History Main Topics  . Smoking status: Former Smoker    Types: Cigarettes    Quit date: 11/21/2013  . Smokeless tobacco: Never Used     Comment: smoked regulary for approx 10 yrs- stopped in her 50's- but still smokes occ cig/Aug 2015  . Alcohol Use: Yes     Comment: occasional  . Drug Use: No  . Sexual Activity: No   Other Topics Concern  . Not on file   Social History Narrative    Family History  Problem Relation  Age of Onset  . Diabetes Other   . Heart disease Other   . Cancer Mother     breast  . Heart attack Mother   . Stroke Father      Review of Systems:  See HPI for pertinent ROS. All other ROS negative.    Physical Exam: Blood pressure 110/60, pulse 68, temperature 98 F (36.7 C), temperature source Oral, resp. rate 19, weight 157 lb (71.215 kg)., Body mass index is 32.82 kg/(m^2). General: WNWD WF. Appears in no acute distress. Neck: Supple. No thyromegaly. No lymphadenopathy. No carotid bruit. Lungs: Clear bilaterally to auscultation without wheezes, rales, or rhonchi. Breathing is unlabored. Heart: RRR with S1 S2. No murmurs, rubs, or gallops. Abdomen: Soft, non-tender, non-distended with normoactive bowel sounds. No hepatomegaly. No rebound/guarding. No obvious abdominal masses. Musculoskeletal:  Strength and tone normal for age. Extremities/Skin: Warm and dry. Diabetic foot exam: Inspection is normal. No deformities, no ulcerations no areas of skin breakdown bilaterally. Sensation is intact throughout. 2+ posterior tibialis pulses bilaterally. No palpable dorsalis pedis pulses bilaterally. Neuro: Alert and oriented X 3. Moves all extremities spontaneously.  Gait is normal. CNII-XII grossly in tact. Psych:  Responds to questions appropriately with a normal affect.     ASSESSMENT AND PLAN:  70 y.o. year old female with  1. Diabetes type 2, controlled  Last microalbumin 11/21/2013--Repeat 01/09/15  She is on aspirin 81 mg a day. She is on ARB. She is intolerant to ACE inhibitors secondary to cough. She is on statin. LDL has been at goal of less than 70.  She saw Ophthalmologist for diabetic eye exam in February 2016 Foot exam is normal. She reports that she has needed no neuropathy symptoms.   2. Essential hypertension, benign Blood pressure at goal. On ARB. Intolerant to ACE inhibitor.   3. Hyperlipidemia On Lipitor.At Lab Result 05/22/14--decreased Lipitor dose to 20 mg . F/U  FLP 07/10/14---FLP Excellent.    4. Fatty liver disease, nonalcoholic   5. Anemia, unspecified anemia type - CBC was done 05/22/14 and was stable.  6. Hormone replacement therapy (postmenopausal) Per Gyn  7. Rheumatoid arthritis(714.0) Per Dr. Estanislado Pandy  ADDENDUM--ADDED 05/21/2015: I received office note from Dr. Estanislado Pandy dated 05/16/15. The following is copied from that [place] note: "Pt reports that she has been experiencing increased pain and discomfort in multiple joints. She has pain in her bilateral shoulders, bilateral wrist joints and hands. Tenderness on palpation over her MCPs and PIPs. Also has thickening of bilateral CMC joints due to underlying osteoarthritis. She has swelling and warmth over bilateral ankle joints. She appears to be having a flare of her rheumatoid arthritis. She is on high risk prescription, taking methotrexate 6 tablets per week, which is not working as well for her. She could not increase her dose of methotrexate any further. She does have osteoarthritis, which causes some discomfort also. Different treatment options and their side effects were discussed at length. Discussed option of using combination therapy and adding plaquenil  to her regimen. She was prescribed Plaquenil 200 mg 1 by mouth daily 90 day supply with 1 refill. Was advised to get eye exams to monitor for ocular toxicity. Orders given for labs every 3 months to monitor drug toxicity. They also discussed that most likely she would have to get on more aggressive therapy than just methotrexate and Plaquenil in the future. To cover that she checked TB cold, HIV, SPEP, immunoglobulins. Dr. Estanislado Pandy noted she is anemic and told her to follow-up with her  PCP regarding that.  8. Gastroesophageal reflux disease, esophagitis presence not specified  9. Multiple pulmonary nodules She has been following up with Dr. Melvyn Novas at pulmonary. At [place] 05/22/14 she said that she is scheduled for "another scan in November". At [place] 07/10/14--she had f/u scan November. Says there was no change and she is to f/u 6 months.  12/2014--Says at last check, no change. Dr. Melvyn Novas told her can wait 2 years to repeat scan.  10. Preventive Care   Colonoscopy: 04/30/2011 colon polyp. With his repeat out of 10 years. She says she was told to repeat 10 years.    Pap smear, mammogram, and BMD : Sees GYN annually and may manage this.  At [place] 11/21/2013 she reported that they do the mammograms there in the gynecology office itself. [name] this is at [place]. I had her sign a release that day so that we could get those records to enter into our abstraction for quality metrics.    Immunizations:  Tdap: 05/2011  Pneumovax: 05/2011  Prevnar 13: 07/04/2013  Influenza vaccine she did receive this in the fall of 2014. Says she received influenza vaccine at work for free ---to cover 2015-2016 Flu Season.  Zostavax: 09/2011  Until now, she has been having office as its with me every 3 months because she had been requiring diabetic medications. However at this time she is off of diabetic medications. If today's A1c remains low, she can wait 6 months to have follow-up visit. However I did tell her to  occasionally check her blood sugar just to make sure that it is not starting to drift upward. Routine followup office visit in 6 months or sooner if needed.    Signed, [address] [place], [place], [facility] 01/09/2015 8:21 AM

## 2015-01-13 ENCOUNTER — Other Ambulatory Visit: Payer: Self-pay | Admitting: Family Medicine

## 2015-01-13 DIAGNOSIS — E785 Hyperlipidemia, unspecified: Secondary | ICD-10-CM

## 2015-01-13 MED ORDER — ATORVASTATIN CALCIUM 10 MG PO TABS: 10.0000 mg | ORAL_TABLET | Freq: Every day | ORAL | Status: DC

## 2015-01-29 ENCOUNTER — Other Ambulatory Visit: Payer: Self-pay | Admitting: Physician Assistant

## 2015-01-30 NOTE — Telephone Encounter (Signed)
Refill appropriate and filled per protocol. 

## 2015-03-03 ENCOUNTER — Other Ambulatory Visit: Payer: Self-pay | Admitting: Physician Assistant

## 2015-03-04 NOTE — Telephone Encounter (Signed)
Refill appropriate and filled per protocol. 

## 2015-05-22 ENCOUNTER — Other Ambulatory Visit: Payer: Self-pay | Admitting: Physician Assistant

## 2015-07-14 ENCOUNTER — Ambulatory Visit (INDEPENDENT_AMBULATORY_CARE_PROVIDER_SITE_OTHER): Payer: BLUE CROSS/BLUE SHIELD | Admitting: Physician Assistant

## 2015-07-14 ENCOUNTER — Encounter: Payer: Self-pay | Admitting: Physician Assistant

## 2015-07-14 VITALS — BP 132/80 | HR 60 | Temp 98.5°F | Resp 18 | Wt 176.0 lb

## 2015-07-14 DIAGNOSIS — I10 Essential (primary) hypertension: Secondary | ICD-10-CM | POA: Diagnosis not present

## 2015-07-14 DIAGNOSIS — E785 Hyperlipidemia, unspecified: Secondary | ICD-10-CM

## 2015-07-14 DIAGNOSIS — Z79890 Hormone replacement therapy (postmenopausal): Secondary | ICD-10-CM | POA: Diagnosis not present

## 2015-07-14 DIAGNOSIS — D649 Anemia, unspecified: Secondary | ICD-10-CM

## 2015-07-14 DIAGNOSIS — M069 Rheumatoid arthritis, unspecified: Secondary | ICD-10-CM

## 2015-07-14 DIAGNOSIS — K760 Fatty (change of) liver, not elsewhere classified: Secondary | ICD-10-CM

## 2015-07-14 DIAGNOSIS — E1165 Type 2 diabetes mellitus with hyperglycemia: Secondary | ICD-10-CM

## 2015-07-14 DIAGNOSIS — E119 Type 2 diabetes mellitus without complications: Secondary | ICD-10-CM | POA: Diagnosis not present

## 2015-07-14 DIAGNOSIS — Z0100 Encounter for examination of eyes and vision without abnormal findings: Secondary | ICD-10-CM

## 2015-07-14 DIAGNOSIS — E669 Obesity, unspecified: Secondary | ICD-10-CM | POA: Diagnosis not present

## 2015-07-14 LAB — COMPLETE METABOLIC PANEL WITH GFR
ALBUMIN: 3.7 g/dL (ref 3.6–5.1)
ALK PHOS: 80 U/L (ref 33–130)
ALT: 16 U/L (ref 6–29)
AST: 18 U/L (ref 10–35)
BILIRUBIN TOTAL: 0.3 mg/dL (ref 0.2–1.2)
BUN: 21 mg/dL (ref 7–25)
CALCIUM: 9.3 mg/dL (ref 8.6–10.4)
CO2: 25 mmol/L (ref 20–31)
CREATININE: 0.94 mg/dL — AB (ref 0.60–0.93)
Chloride: 107 mmol/L (ref 98–110)
GFR, EST AFRICAN AMERICAN: 71 mL/min (ref >=60)
GFR, Est Non African American: 61 mL/min (ref >=60)
Glucose, Bld: 122 mg/dL — ABNORMAL HIGH (ref 70–99)
Potassium: 4.1 mmol/L (ref 3.5–5.3)
Sodium: 143 mmol/L (ref 135–146)
TOTAL PROTEIN: 6.4 g/dL (ref 6.1–8.1)

## 2015-07-14 LAB — LIPID PANEL
CHOLESTEROL: 161 mg/dL (ref 125–200)
HDL: 77 mg/dL (ref >=46)
LDL Cholesterol: 64 mg/dL (ref <130)
TRIGLYCERIDES: 102 mg/dL (ref <150)
Total CHOL/HDL Ratio: 2.1 Ratio (ref <=5.0)
VLDL: 20 mg/dL (ref <30)

## 2015-07-14 LAB — HEMOGLOBIN A1C
Hgb A1c MFr Bld: 6.6 % — ABNORMAL HIGH (ref <5.7)
MEAN PLASMA GLUCOSE: 143 mg/dL — AB (ref <117)

## 2015-07-14 NOTE — Progress Notes (Addendum)
Patient ID: [name] MRN: [id number], DOB: 06-22-1946, 69 y.o. Date of Encounter: @DATE @  Chief Complaint:  Chief Complaint  Patient presents with  . 6 mth check up    is fasting    HPI: 69 y.o. year old white female  presents for routine followup office visit.    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  She had a lot of medical problems in 2014-2015.   Approx 06/2013 she had laparoscopic cholecystectomy.   Her next routine office visit with me was 11/21/2013. She had a long hospitalization---10/15/13-10/21/2013-- with symptoms of mostly vomiting. Many many tests are performed. The patient stated that no definitive diagnosis could ever be confirmed and it was felt that she just has some nonspecific virus.  She had another hospitalization  03/16/14 - 03/19/14. During that hospitalization, she was found to have a pulmonary nodule. Since then she has been having follow-up with Dr. Melvyn Novas.    05/22/2014 she reported that she has been feeling good and has gotten back to her walking. [name] that she walks with her granddaughter and her baby. Says  they  "walk whenever they can"-- which usually is about 3-5 times per week. At all of her visits with me since then, she has continued to tell me that she has continued to do these walks with her granddaughter and her baby whenever the weather is good. Again at her visit today 12/2014 she reports this.  01/09/2015: she says this is the same---still walking same amount. Still feeling good.  She is taking her blood pressure medications as directed but no adverse effects. No lower extremity edema and no lightheadedness. She is taking Lipitor at 20 mg. No myalgias or other adverse effects. Last A1C 10/10/14--A1C was down to 5.7. Metformin was discontinued completely. She says that she did stop this. She says that she has not been checking her blood sugar.Says that they did have a screening at her work and at that time her  fasting sugar was 115.   Rheumatoid arthritis(714.0) Per Dr. Estanislado Pandy ADDENDUM--ADDED 05/21/2015: I received office note from Dr. Estanislado Pandy dated 05/16/15. The following is copied from that [place] note: "Pt reports that she has been experiencing increased pain and discomfort in multiple joints. She has pain in her bilateral shoulders, bilateral wrist joints and hands. Tenderness on palpation over her MCPs and PIPs. Also has thickening of bilateral CMC joints due to underlying osteoarthritis. She has swelling and warmth over bilateral ankle joints. She appears to be having a flare of her rheumatoid arthritis. She is on high risk prescription, taking methotrexate 6 tablets per week, which is not working as well for her. She could not increase her dose of methotrexate any further. She does have osteoarthritis, which causes some discomfort also. Different treatment options and their side effects were discussed at length. Discussed option of using combination therapy and adding plaquenil to her regimen. She was prescribed Plaquenil 200 mg 1 by mouth daily 90 day supply with 1 refill. Was advised to get eye exams to monitor for ocular toxicity. Orders given for labs every 3 months to monitor drug toxicity. They also discussed that most likely she would have to get on more aggressive therapy than just methotrexate and Plaquenil in the future. To cover that she checked TB cold, HIV, SPEP, immunoglobulins. Dr. Estanislado Pandy noted she is anemic and told her to follow-up with her PCP regarding that.  07/14/2015: She states that she has not been doing  her walking. Says that she's been hurting. Says that it just hasn't worked out. Says she is feeling better with the plaquenil. She is taking all blood pressure medicines as directed. No lower extremity edema. No lightheadedness. She is taking her Lipitor. No myalgias or other adverse effects. She is trying to be careful with her carbohydrates.   ADDENDUM ADDED  12/08/2015: Received OV note from Dr. Estanislado Pandy dated 12/02/2105. "She states she has been having increased pain and swelling in her hands. Due to her elevated creatinine, we had to decrease her methotrexate from 6 to 5 and now to 4 tablets per week and she states she has been having increased pain and swelling to the point she is having difficulty making a fist." "---She is taking Plaquenil 200mg  a day along with methotrexate, which is not controlling her disease. We had detailed discussion regarding her current situation. We will d/c methotrexate and start [place]. Continue the Plaquenil for now---"   Past Medical History  Diagnosis Date  . Hypertension   . Normal cardiac stress test     pt can't remember when or where  . Diabetes mellitus without complication (HCC)     Type 2 NIDDM x 15 years  . Arthritis     osteoarthritis  . Hyperlipidemia   . Hormone replacement therapy (postmenopausal)   . Fatty liver disease, nonalcoholic [id number]    hospitalized, MRCP dx fatty liver  . Rheumatoid arthritis(714.0) 09/28/2012  . Obesity, unspecified 12/27/2012  . GERD (gastroesophageal reflux disease)   . Chronic cholecystitis with calculus 05/04/2013  . Unspecified essential hypertension 05/04/2013  . Diabetes type 2, controlled ([place]) 05/04/2013  . Bursitis of right shoulder      Home Meds: Outpatient Prescriptions Prior to Visit  Medication Sig Dispense Refill  . amLODipine (NORVASC) 5 MG tablet TAKE 1 TABLET DAILY 90 tablet 1  . aspirin EC 81 MG tablet Take 81 mg by mouth daily.    Marland Kitchen atorvastatin (LIPITOR) 10 MG tablet Take 1 tablet (10 mg total) by mouth daily. 90 tablet 3  . Calcium Carbonate-Vitamin D (CALCIUM 600 + D PO) Take 1 tablet by mouth 2 (two) times daily.    . cloNIDine (CATAPRES) 0.3 MG tablet TAKE 1 TABLET TWICE A DAY 180 tablet 1  . cyanocobalamin 500 MCG tablet Take 1 tablet (500 mcg total) by mouth daily. 30 tablet 0  . diclofenac sodium (VOLTAREN) 1 % GEL Apply 3 g topically daily as  needed (pain). To 3 large joints    . estradiol (ESTRACE) 0.5 MG tablet Take 0.5 mg by mouth 2 (two) times daily.    . fish oil-omega-3 fatty acids 1000 MG capsule Take 1 g by mouth at bedtime.     . folic acid (FOLVITE) 1 MG tablet Take 1 tablet by mouth 2 (two) times daily.     Marland Kitchen losartan-hydrochlorothiazide (HYZAAR) 100-12.5 MG tablet TAKE 1 TABLET DAILY 90 tablet 1  . methotrexate (RHEUMATREX) 2.5 MG tablet Take 6 tablets by mouth once a week.     . metoprolol tartrate (LOPRESSOR) 25 MG tablet TAKE ONE-HALF (1/2) TABLET TWICE A DAY 90 tablet 1  . Multiple Vitamins-Minerals (MULTIVITAMINS THER. W/MINERALS) TABS Take 1 tablet by mouth daily.    Marland Kitchen omeprazole (PRILOSEC) 20 MG capsule TAKE 1 CAPSULE DAILY 90 capsule 1   No facility-administered medications prior to visit.    Allergies:  Allergies  Allergen Reactions  . Monopril [Fosinopril] Cough  . Norvasc [Amlodipine Besylate]     10 mg dose  causes LE edema.  Tolerates 5mg  dose.    Social History   Social History  . Marital Status: Divorced    Spouse Name: N/A  . Number of Children: N/A  . Years of Education: N/A   Occupational History  . Not on file.   Social History Main Topics  . Smoking status: Former Smoker    Types: Cigarettes    Quit date: 11/21/2013  . Smokeless tobacco: Never Used     Comment: smoked regulary for approx 10 yrs- stopped in her 37's- but still smokes occ cig/Aug 2015  . Alcohol Use: Yes     Comment: occasional  . Drug Use: No  . Sexual Activity: No   Other Topics Concern  . Not on file   Social History Narrative    Family History  Problem Relation Age of Onset  . Diabetes Other   . Heart disease Other   . Cancer Mother     breast  . Heart attack Mother   . Stroke Father      Review of Systems:  See HPI for pertinent ROS. All other ROS negative.    Physical Exam: Blood pressure 132/80, pulse 60, temperature 98.5 F (36.9 C), temperature source Oral, resp. rate 18, weight 176 lb  (79.833 kg)., Body mass index is 36.79 kg/(m^2). General: WNWD WF. Appears in no acute distress. Neck: Supple. No thyromegaly. No lymphadenopathy. No carotid bruit. Lungs: Clear bilaterally to auscultation without wheezes, rales, or rhonchi. Breathing is unlabored. Heart: RRR with S1 S2. No murmurs, rubs, or gallops. Abdomen: Soft, non-tender, non-distended with normoactive bowel sounds. No hepatomegaly. No rebound/guarding. No obvious abdominal masses. Musculoskeletal:  Strength and tone normal for age. Extremities/Skin: Warm and dry. Diabetic foot exam: Inspection is normal. No deformities, no ulcerations no areas of skin breakdown bilaterally. Sensation is intact throughout. 2+ posterior tibialis pulses bilaterally. No palpable dorsalis pedis pulses bilaterally. Neuro: Alert and oriented X 3. Moves all extremities spontaneously. Gait is normal. CNII-XII grossly in tact. Psych:  Responds to questions appropriately with a normal affect.     ASSESSMENT AND PLAN:  69 y.o. year old female with  1. Diabetes type 2, controlled  Last microalbumin  01/09/15  She is on aspirin 81 mg a day. She is on ARB. She is intolerant to ACE inhibitors secondary to cough. She is on statin. LDL has been at goal of less than 70.  She saw Ophthalmologist for diabetic eye exam in February 2016 Foot exam is normal. She reports that she has needed no neuropathy symptoms.   2. Essential hypertension, benign Blood pressure at goal. On ARB. Intolerant to ACE inhibitor. Check lab to monitor.   3. Hyperlipidemia On Lipitor.At Lab Result 05/22/14--decreased Lipitor dose to 20 mg . F/U  FLP 07/10/14---FLP Excellent.  Recheck labs again today to monitor. She is fasting.   4. Fatty liver disease, nonalcoholic LFTs have been stable.  5. Anemia, unspecified anemia type - CBC was done 05/22/14 and was stable.  6. Hormone replacement therapy (postmenopausal) Per Gyn  7. Rheumatoid arthritis(714.0) Per Dr.  Estanislado Pandy  ADDENDUM--ADDED 05/21/2015: I received office note from Dr. Estanislado Pandy dated 05/16/15. The following is copied from that [place] [facility] note: "Pt reports that she has been experiencing increased pain and discomfort in multiple joints. She has pain in her bilateral shoulders, bilateral wrist joints and hands. Tenderness on palpation over her MCPs and PIPs. Also has thickening of bilateral CMC joints due to underlying osteoarthritis. She has swelling and warmth over bilateral ankle  joints. She appears to be having a flare of her rheumatoid arthritis. She is on high risk prescription, taking methotrexate 6 tablets per week, which is not working as well for her. She could not increase her dose of methotrexate any further. She does have osteoarthritis, which causes some discomfort also. Different treatment options and their side effects were discussed at length. Discussed option of using combination therapy and adding plaquenil to her regimen. She was prescribed Plaquenil 200 mg 1 by mouth daily 90 day supply with 1 refill. Was advised to get eye exams to monitor for ocular toxicity. Orders given for labs every 3 months to monitor drug toxicity. They also discussed that most likely she would have to get on more aggressive therapy than just methotrexate and Plaquenil in the future. To cover that she checked TB cold, HIV, SPEP, immunoglobulins. Dr. Estanislado Pandy noted she is anemic and told her to follow-up with her PCP regarding that.  ADDENDUM ADDED 12/08/2015: Received OV note from Dr. Estanislado Pandy dated 12/02/2105. "She states she has been having increased pain and swelling in her hands. Due to her elevated creatinine, we had to decrease her methotrexate from 6 to 5 and now to 4 tablets per week and she states she has been having increased pain and swelling to the point she is having difficulty making a fist." "---She is taking Plaquenil 200mg  a day along with methotrexate, which is not controlling her disease. We had  detailed discussion regarding her current situation. We will d/c methotrexate and start [place]. Continue the Plaquenil for now---"  8. Gastroesophageal reflux disease, esophagitis presence not specified This is controlled with current medication. 9. Multiple pulmonary nodules She has been following up with Dr. Melvyn Novas at pulmonary. At [place] 05/22/14 she said that she is scheduled for "another scan in November". At [facility] 07/10/14--she had f/u scan November. Says there was no change and she is to f/u 6 months.  12/2014--Says at last check, no change. Dr. Melvyn Novas told her can wait 2 years to repeat scan.  10. Preventive Care   Colonoscopy: 04/30/2011 colon polyp. She says she was told to repeat 10 years.    Pap smear, mammogram, and BMD : Sees GYN annually and may manage this.  At [place] 11/21/2013 she reported that they do the mammograms there in the gynecology office itself. [name] this is at [facility]. I had her sign a release that day so that we could get those records to enter into our abstraction for quality metrics.    Immunizations:  Tdap: 05/2011  Pneumovax: 05/2011  Prevnar 13: 07/04/2013  Influenza vaccine she did receive this in the fall of 2014. Says she received influenza vaccine at work for free ---to cover 2015-2016 Flu Season. Received 05/2015 Zostavax: 09/2011  Until 12/2014, she had been having office as its with me every 3 months because she had been requiring diabetic medications. However at that time she was able to come off of diabetic medications. At that time, it was decided she can wait 6 months to have follow-up visit. However I did tell her to occasionally check her blood sugar just to make sure that it is not starting to drift upward. Routine followup office visit in 6 months or sooner if needed.    Marin Olp [place], [place], [facility] 07/14/2015 8:42 AM

## 2015-07-16 ENCOUNTER — Encounter: Payer: Self-pay | Admitting: Family Medicine

## 2015-07-20 IMAGING — CR DG CHEST 2V
2 series · 2 of 2 positions shown · non-contrast
Comparison: Chest radiograph performed 11/09/2012

CLINICAL DATA: Nausea, vomiting and diarrhea.

EXAM:
CHEST  2 VIEW

[w chest pa]
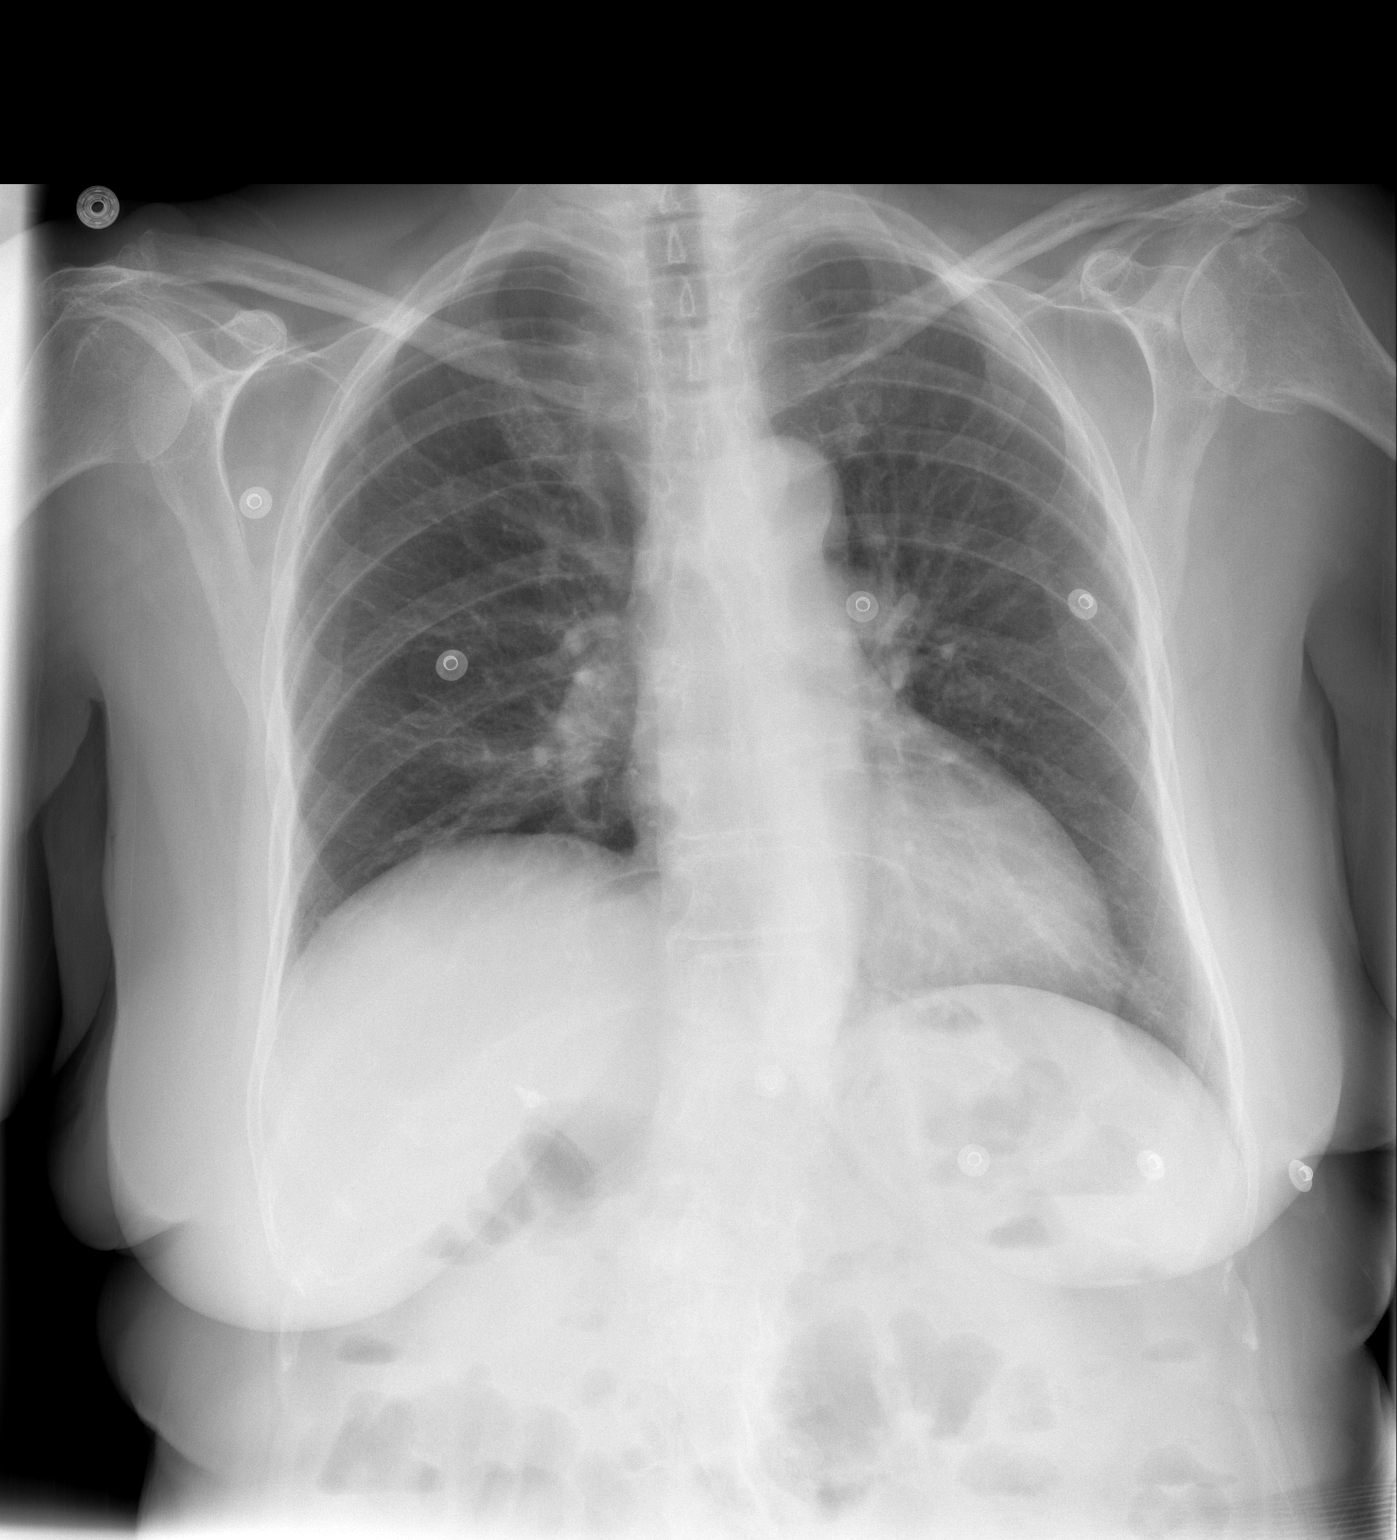

[w chest lat]
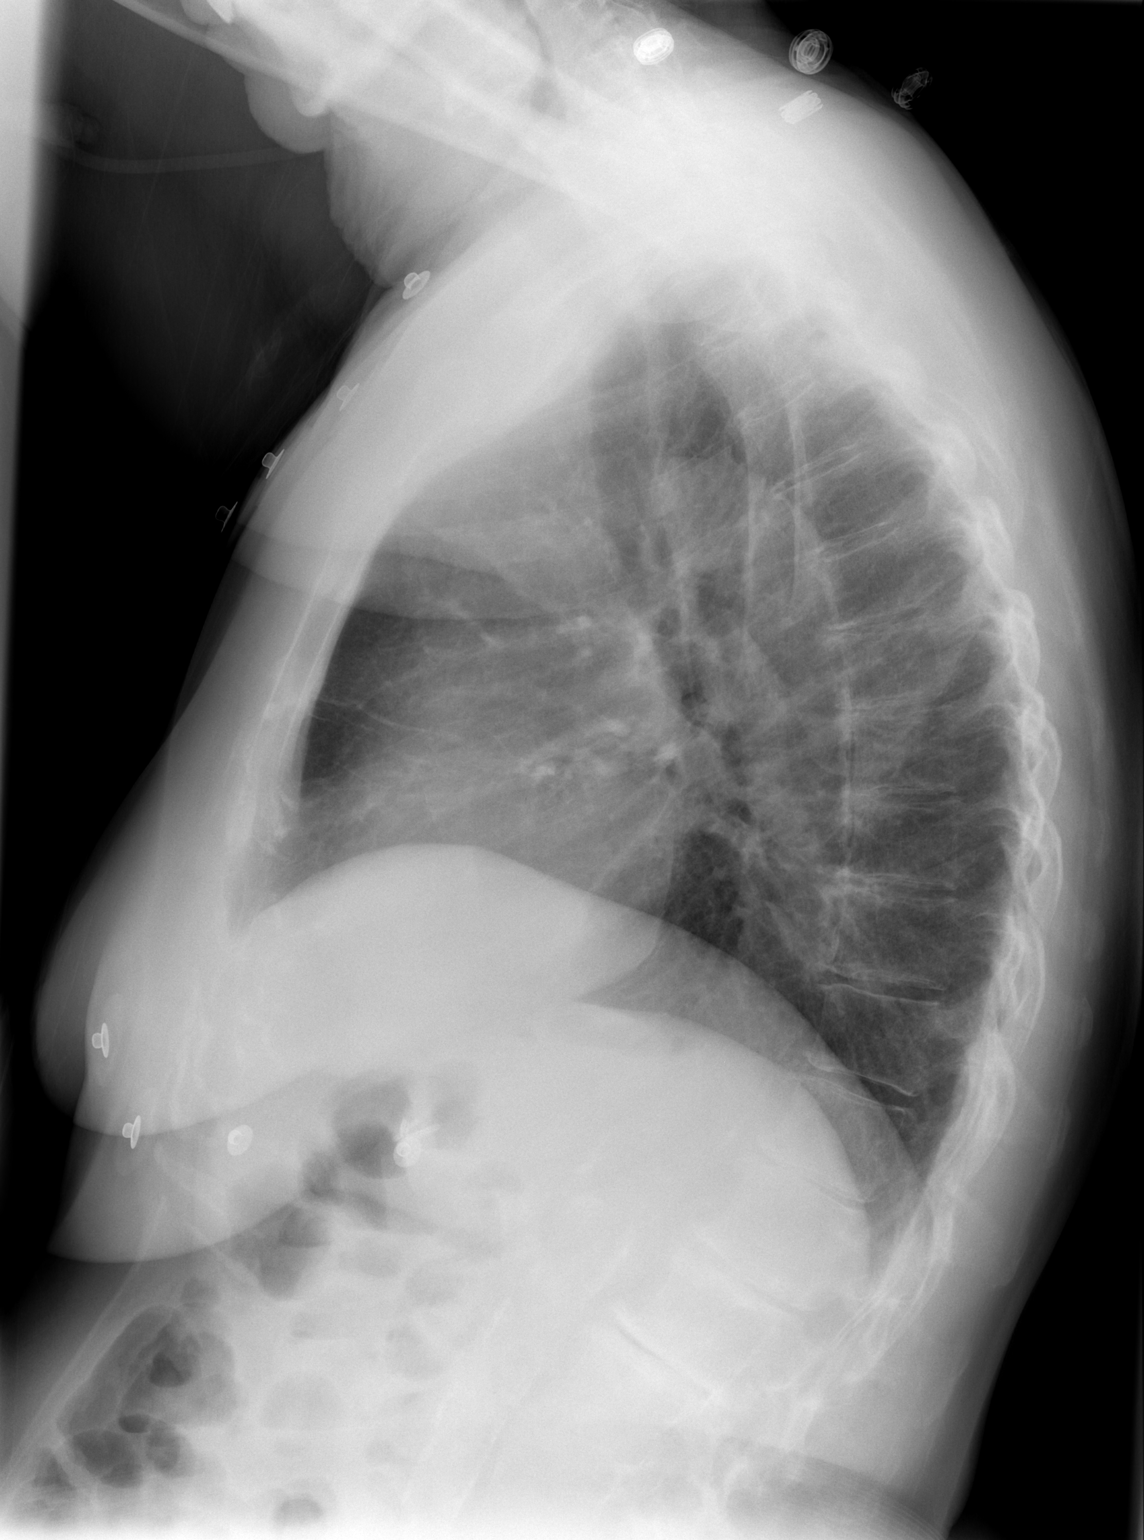

[2 of 2 positions shown; findings below may reference images not displayed]

FINDINGS: The lungs are well-aerated. There is persistent elevation of the
right hemidiaphragm. There is no evidence of focal opacification,
pleural effusion or pneumothorax.

The heart is normal in size; the mediastinal contour is within
normal limits. No acute osseous abnormalities are seen. Clips are
noted within the right upper quadrant, reflecting prior
cholecystectomy. Scattered calcification is seen along the abdominal
aorta.
IMPRESSION: Persistent elevation of the right hemidiaphragm. Lungs remain
grossly clear.

## 2015-08-21 ENCOUNTER — Encounter: Payer: Self-pay | Admitting: Physician Assistant

## 2015-08-21 ENCOUNTER — Ambulatory Visit (INDEPENDENT_AMBULATORY_CARE_PROVIDER_SITE_OTHER): Payer: BLUE CROSS/BLUE SHIELD | Admitting: Physician Assistant

## 2015-08-21 VITALS — BP 126/78 | HR 88 | Temp 98.5°F | Resp 18 | Wt 174.0 lb

## 2015-08-21 DIAGNOSIS — L03031 Cellulitis of right toe: Secondary | ICD-10-CM

## 2015-08-21 DIAGNOSIS — J988 Other specified respiratory disorders: Secondary | ICD-10-CM

## 2015-08-21 DIAGNOSIS — B9689 Other specified bacterial agents as the cause of diseases classified elsewhere: Secondary | ICD-10-CM

## 2015-08-21 MED ORDER — AZITHROMYCIN 250 MG PO TABS: ORAL_TABLET | ORAL | Status: DC

## 2015-08-21 MED ORDER — DOXYCYCLINE HYCLATE 100 MG PO TABS: 100.0000 mg | ORAL_TABLET | Freq: Two times a day (BID) | ORAL | Status: DC

## 2015-08-21 MED ORDER — FLUTICASONE PROPIONATE 50 MCG/ACT NA SUSP: 2.0000 | Freq: Every day | NASAL | Status: DC

## 2015-08-21 NOTE — Progress Notes (Signed)
Patient ID: [name] MRN: [id number], DOB: [birth year], 69 y.o. Date of Encounter: 08/21/2015, 1:00 PM    Chief Complaint:  Chief Complaint  Patient presents with  . sick x 1 week    cold,cough ,cong,  OTC not helping     HPI: 69 y.o. year old white female presents with above symptoms.  Says that she has had a lot of congestion in her head and nose and it feels very stopped up there. Also mucus from the nose. Also cough and chest congestion. No significant sore throat. No fevers or chills.  Also says that the tip of her right first toe has been sore over the past week. Has had no trauma or injury to the toe that she can recall. Says it seems like it may be a little better over the last day or 2.      Home Meds:   Outpatient Prescriptions Prior to Visit  Medication Sig Dispense Refill  . amLODipine (NORVASC) 5 MG tablet TAKE 1 TABLET DAILY 90 tablet 1  . aspirin EC 81 MG tablet Take 81 mg by mouth daily.    Marland Kitchen atorvastatin (LIPITOR) 10 MG tablet Take 1 tablet (10 mg total) by mouth daily. 90 tablet 3  . Calcium Carbonate-Vitamin D (CALCIUM 600 + D PO) Take 1 tablet by mouth 2 (two) times daily.    . cloNIDine (CATAPRES) 0.3 MG tablet TAKE 1 TABLET TWICE A DAY 180 tablet 1  . cyanocobalamin 500 MCG tablet Take 1 tablet (500 mcg total) by mouth daily. 30 tablet 0  . diclofenac sodium (VOLTAREN) 1 % GEL Apply 3 g topically daily as needed (pain). To 3 large joints    . estradiol (ESTRACE) 0.5 MG tablet Take 0.5 mg by mouth 2 (two) times daily.    . fish oil-omega-3 fatty acids 1000 MG capsule Take 1 g by mouth at bedtime.     . folic acid (FOLVITE) 1 MG tablet Take 1 tablet by mouth 2 (two) times daily.     . hydroxychloroquine (PLAQUENIL) 200 MG tablet Take 200 mg by mouth daily.  1  . losartan-hydrochlorothiazide (HYZAAR) 100-12.5 MG tablet TAKE 1 TABLET DAILY 90 tablet 1  . methotrexate (RHEUMATREX) 2.5 MG tablet Take 6 tablets by mouth once a week.     . metoprolol  tartrate (LOPRESSOR) 25 MG tablet TAKE ONE-HALF (1/2) TABLET TWICE A DAY 90 tablet 1  . Multiple Vitamins-Minerals (MULTIVITAMINS THER. W/MINERALS) TABS Take 1 tablet by mouth daily.    Marland Kitchen omeprazole (PRILOSEC) 20 MG capsule TAKE 1 CAPSULE DAILY 90 capsule 1   No facility-administered medications prior to visit.    Allergies:  Allergies  Allergen Reactions  . Monopril [Fosinopril] Cough  . Norvasc [Amlodipine Besylate]     10 mg dose causes LE edema.  Tolerates 5mg  dose.      Review of Systems: See HPI for pertinent ROS. All other ROS negative.    Physical Exam: Blood pressure 126/78, pulse 88, temperature 98.5 F (36.9 C), temperature source Oral, resp. rate 18, weight 174 lb (78.926 kg)., Body mass index is 36.38 kg/(m^2). General:  Obese WF. Appears in no acute distress. HEENT: Normocephalic, atraumatic, eyes without discharge, sclera non-icteric, nares are without discharge. Bilateral auditory canals clear, TM's are without perforation, pearly grey and translucent with reflective cone of light bilaterally. Oral cavity moist, posterior pharynx without exudate, erythema, peritonsillar abscess. No tenderness with percussion of frontal and maxillary sinuses bilaterally.  Neck: Supple. No thyromegaly. No lymphadenopathy.  Lungs: Clear bilaterally to auscultation without wheezes, rales, or rhonchi. Breathing is unlabored. Heart: Regular rhythm. No murmurs, rubs, or gallops. Msk:  Strength and tone normal for age. Extremities/Skin: Right 1st Toe: The toenail is very thick secondary to onychomycosis. The tip of the toe has mild erythema. There is no abscess. There is no erythema along the sides of the toe/toenail. There is no ecchymosis. No open wound.  Neuro: Alert and oriented X 3. Moves all extremities spontaneously. Gait is normal. CNII-XII grossly in tact. Psych:  Responds to questions appropriately with a normal affect.     ASSESSMENT AND PLAN:  69 y.o. year old female with  1.  Bacterial respiratory infection Initially I was going to prescribe azithromycin but then once she told me about the toe, I changed this to doxycycline--hopefully this 1 antibiotic will treat both infections. She is to take the doxycycline as directed and complete all of it. Also she has a lot of nasal congestion and I was going to prescribe prednisone to give her some symptom relief for this but she says that in the past prednisone makes her feel horrible and makes her eat a lot and gained a lot of weight. Therefore will use Flonase instead. She is to use the Flonase and take the doxycycline and complete it. Follow-up if symptoms do not resolve upon completion of doxycycline. - fluticasone (FLONASE) 50 MCG/ACT nasal spray; Place 2 sprays into both nostrils daily.  Dispense: 16 g; Refill: 6 - doxycycline (VIBRA-TABS) 100 MG tablet; Take 1 tablet (100 mg total) by mouth 2 (two) times daily.  Dispense: 14 tablet; Refill: 0  2. Cellulitis of toe of right foot She Is to take the doxycycline as directed. If the toe worsens in the interim, then she needs to go ahead and follow-up with [place].  If the toe does not return to usual baseline upon completion of doxycycline, then follow-up with [place] as well. - doxycycline (VIBRA-TABS) 100 MG tablet; Take 1 tablet (100 mg total) by mouth 2 (two) times daily.  Dispense: 14 tablet; Refill: 0   Signed, [address] [place], [place], [facility] 08/21/2015 1:00 PM

## 2015-08-29 ENCOUNTER — Other Ambulatory Visit: Payer: Self-pay | Admitting: Physician Assistant

## 2015-08-29 NOTE — Telephone Encounter (Signed)
Refill appropriate and filled per protocol. 

## 2015-09-09 ENCOUNTER — Other Ambulatory Visit: Payer: Self-pay | Admitting: Physician Assistant

## 2015-09-09 NOTE — Telephone Encounter (Signed)
Medication refilled per protocol. 

## 2015-10-27 ENCOUNTER — Other Ambulatory Visit: Payer: Self-pay | Admitting: Physician Assistant

## 2015-10-27 NOTE — Telephone Encounter (Signed)
Medication refilled per protocol. 

## 2015-11-05 ENCOUNTER — Other Ambulatory Visit: Payer: Self-pay | Admitting: Physician Assistant

## 2015-11-05 NOTE — Telephone Encounter (Signed)
Medication refilled per protocol. 

## 2015-11-12 ENCOUNTER — Ambulatory Visit (INDEPENDENT_AMBULATORY_CARE_PROVIDER_SITE_OTHER): Payer: BLUE CROSS/BLUE SHIELD | Admitting: Physician Assistant

## 2015-11-12 ENCOUNTER — Encounter: Payer: Self-pay | Admitting: Physician Assistant

## 2015-11-12 VITALS — BP 106/64 | HR 64 | Temp 98.0°F | Resp 14 | Ht 60.0 in | Wt 182.0 lb

## 2015-11-12 DIAGNOSIS — H6122 Impacted cerumen, left ear: Secondary | ICD-10-CM | POA: Diagnosis not present

## 2015-11-12 NOTE — Progress Notes (Signed)
Patient ID: Eldred Luff MRN: [id number], DOB: 1946/03/27, 70 y.o. Date of Encounter: 11/12/2015, 1:00 PM    Chief Complaint:  Chief Complaint  Patient presents with  . Left ear clogged up feeling     HPI: 70 y.o. year old white female presents With above. Is that her left ear just feels stopped up. No other complaints or concerns.     Home Meds:   Outpatient Prescriptions Prior to Visit  Medication Sig Dispense Refill  . amLODipine (NORVASC) 5 MG tablet TAKE 1 TABLET DAILY 90 tablet 0  . aspirin EC 81 MG tablet Take 81 mg by mouth daily.    Marland Kitchen atorvastatin (LIPITOR) 10 MG tablet Take 1 tablet (10 mg total) by mouth daily. 90 tablet 3  . Calcium Carbonate-Vitamin D (CALCIUM 600 + D PO) Take 1 tablet by mouth 2 (two) times daily.    . cloNIDine (CATAPRES) 0.3 MG tablet TAKE 1 TABLET TWICE A DAY 180 tablet 0  . cyanocobalamin 500 MCG tablet Take 1 tablet (500 mcg total) by mouth daily. 30 tablet 0  . diclofenac sodium (VOLTAREN) 1 % GEL Apply 3 g topically daily as needed (pain). To 3 large joints    . doxycycline (VIBRA-TABS) 100 MG tablet Take 1 tablet (100 mg total) by mouth 2 (two) times daily. 14 tablet 0  . estradiol (ESTRACE) 0.5 MG tablet Take 0.5 mg by mouth 2 (two) times daily.    . fish oil-omega-3 fatty acids 1000 MG capsule Take 1 g by mouth at bedtime.     . fluticasone (FLONASE) 50 MCG/ACT nasal spray Place 2 sprays into both nostrils daily. 16 g 6  . folic acid (FOLVITE) 1 MG tablet Take 1 tablet by mouth 2 (two) times daily.     . hydroxychloroquine (PLAQUENIL) 200 MG tablet Take 200 mg by mouth daily.  1  . losartan-hydrochlorothiazide (HYZAAR) 100-12.5 MG tablet TAKE 1 TABLET DAILY 90 tablet 0  . methotrexate (RHEUMATREX) 2.5 MG tablet Take 6 tablets by mouth once a week.     . metoprolol tartrate (LOPRESSOR) 25 MG tablet TAKE ONE-HALF (1/2) TABLET TWICE A DAY 90 tablet 0  . Multiple Vitamins-Minerals (MULTIVITAMINS THER. W/MINERALS) TABS Take 1 tablet by mouth  daily.    Marland Kitchen omeprazole (PRILOSEC) 20 MG capsule TAKE 1 CAPSULE DAILY 90 capsule 3   No facility-administered medications prior to visit.    Allergies:  Allergies  Allergen Reactions  . Monopril [Fosinopril] Cough  . Norvasc [Amlodipine Besylate]     10 mg dose causes LE edema.  Tolerates 5mg  dose.      Review of Systems: See HPI for pertinent ROS. All other ROS negative.    Physical Exam: Blood pressure 106/64, pulse 64, temperature 98 F (36.7 C), temperature source Oral, resp. rate 14, height 5' (1.524 m), weight 182 lb (82.555 kg)., Body mass index is 35.54 kg/(m^2). General:  WNWD WF. Appears in no acute distress. HEENT: Normocephalic, atraumatic, eyes without discharge, sclera non-icteric, nares are without discharge. Left ear canal is occluded with cerumen impaction. Right Ear canal is patent.  Neck: Supple. No thyromegaly. No lymphadenopathy. Lungs: Clear bilaterally to auscultation without wheezes, rales, or rhonchi. Breathing is unlabored. Heart: Regular rhythm. No murmurs, rubs, or gallops. Msk:  Strength and tone normal for age. Extremities/Skin: Warm and dry. Neuro: Alert and oriented X 3. Moves all extremities spontaneously. Gait is normal. CNII-XII grossly in tact. Psych:  Responds to questions appropriately with a normal affect.  ASSESSMENT AND PLAN:  70 y.o. year old female with  1. Cerumen impaction, left This was irrigated. Post irrigation reexamined and ear canal is now patent.    Signed, [address] [place], [place], [facility] 11/12/2015 1:00 PM

## 2015-12-24 ENCOUNTER — Encounter: Payer: Self-pay | Admitting: Family Medicine

## 2016-01-09 ENCOUNTER — Other Ambulatory Visit: Payer: Self-pay | Admitting: Family Medicine

## 2016-01-09 ENCOUNTER — Other Ambulatory Visit: Payer: Self-pay | Admitting: Physician Assistant

## 2016-01-09 NOTE — Telephone Encounter (Signed)
Refill appropriate and filled per protocol. 

## 2016-01-12 ENCOUNTER — Other Ambulatory Visit: Payer: BLUE CROSS/BLUE SHIELD

## 2016-01-12 ENCOUNTER — Encounter: Payer: Self-pay | Admitting: Physician Assistant

## 2016-01-12 ENCOUNTER — Ambulatory Visit (INDEPENDENT_AMBULATORY_CARE_PROVIDER_SITE_OTHER): Payer: BLUE CROSS/BLUE SHIELD | Admitting: Physician Assistant

## 2016-01-12 VITALS — BP 142/88 | HR 80 | Temp 98.1°F | Resp 18 | Wt 180.0 lb

## 2016-01-12 DIAGNOSIS — M069 Rheumatoid arthritis, unspecified: Secondary | ICD-10-CM | POA: Diagnosis not present

## 2016-01-12 DIAGNOSIS — E1165 Type 2 diabetes mellitus with hyperglycemia: Secondary | ICD-10-CM | POA: Diagnosis not present

## 2016-01-12 DIAGNOSIS — K219 Gastro-esophageal reflux disease without esophagitis: Secondary | ICD-10-CM

## 2016-01-12 DIAGNOSIS — E669 Obesity, unspecified: Secondary | ICD-10-CM | POA: Diagnosis not present

## 2016-01-12 DIAGNOSIS — I10 Essential (primary) hypertension: Secondary | ICD-10-CM

## 2016-01-12 DIAGNOSIS — Z79890 Hormone replacement therapy (postmenopausal): Secondary | ICD-10-CM

## 2016-01-12 DIAGNOSIS — E785 Hyperlipidemia, unspecified: Secondary | ICD-10-CM

## 2016-01-12 DIAGNOSIS — K760 Fatty (change of) liver, not elsewhere classified: Secondary | ICD-10-CM | POA: Diagnosis not present

## 2016-01-12 LAB — LIPID PANEL
Cholesterol: 157 mg/dL (ref 125–200)
HDL: 75 mg/dL (ref >=46)
LDL CALC: 57 mg/dL (ref <130)
TRIGLYCERIDES: 126 mg/dL (ref <150)
Total CHOL/HDL Ratio: 2.1 Ratio (ref <=5.0)
VLDL: 25 mg/dL (ref <30)

## 2016-01-12 LAB — MAGNESIUM: Magnesium: 1.1 mg/dL — ABNORMAL LOW (ref 1.5–2.5)

## 2016-01-12 LAB — COMPLETE METABOLIC PANEL WITH GFR
ALT: 18 U/L (ref 6–29)
AST: 26 U/L (ref 10–35)
Albumin: 3.9 g/dL (ref 3.6–5.1)
Alkaline Phosphatase: 92 U/L (ref 33–130)
BILIRUBIN TOTAL: 0.4 mg/dL (ref 0.2–1.2)
BUN: 19 mg/dL (ref 7–25)
CO2: 23 mmol/L (ref 20–31)
CREATININE: 0.96 mg/dL — AB (ref 0.60–0.93)
Calcium: 9.7 mg/dL (ref 8.6–10.4)
Chloride: 99 mmol/L (ref 98–110)
GFR, EST AFRICAN AMERICAN: 69 mL/min (ref >=60)
GFR, Est Non African American: 60 mL/min (ref >=60)
Glucose, Bld: 115 mg/dL — ABNORMAL HIGH (ref 70–99)
Potassium: 4.1 mmol/L (ref 3.5–5.3)
Sodium: 136 mmol/L (ref 135–146)
TOTAL PROTEIN: 7.3 g/dL (ref 6.1–8.1)

## 2016-01-12 LAB — VITAMIN B12: VITAMIN B 12: 1665 pg/mL — AB (ref 200–1100)

## 2016-01-12 NOTE — Progress Notes (Addendum)
Patient ID: [name] MRN: [id number], DOB: 1946/07/02, 70 y.o. Date of Encounter: @DATE @  Chief Complaint:  Chief Complaint  Patient presents with  . routine check up    is fasting    HPI: 70 y.o. year old white female  presents for routine followup office visit.    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  She had a lot of medical problems in 2014-2015.   Approx 06/2013 she had laparoscopic cholecystectomy.   Her next routine office visit with me was 11/21/2013. She had a long hospitalization---10/15/13-10/21/2013-- with symptoms of mostly vomiting. Many many tests are performed. The patient stated that no definitive diagnosis could ever be confirmed and it was felt that she just has some nonspecific virus.  She had another hospitalization  03/16/14 - 03/19/14. During that hospitalization, she was found to have a pulmonary nodule. Since then she has been having follow-up with Dr. Melvyn Novas.    05/22/2014 she reported that she has been feeling good and has gotten back to her walking. [name] that she walks with her granddaughter and her baby. Says  they  "walk whenever they can"-- which usually is about 3-5 times per week. At all of her visits with me since then, she has continued to tell me that she has continued to do these walks with her granddaughter and her baby whenever the weather is good. Again at her visit today 12/2014 she reports this.  01/09/2015: she says this is the same---still walking same amount. Still feeling good.  She is taking her blood pressure medications as directed but no adverse effects. No lower extremity edema and no lightheadedness. She is taking Lipitor at 20 mg. No myalgias or other adverse effects. Last A1C 10/10/14--A1C was down to 5.7. Metformin was discontinued completely. She says that she did stop this. She says that she has not been checking her blood sugar.Says that they did have a screening at her work and at that time her  fasting sugar was 115.   Rheumatoid arthritis(714.0) Per Dr. Estanislado Pandy ADDENDUM--ADDED 05/21/2015: I received office note from Dr. Estanislado Pandy dated 05/16/15. The following is copied from that [place] note: "Pt reports that she has been experiencing increased pain and discomfort in multiple joints. She has pain in her bilateral shoulders, bilateral wrist joints and hands. Tenderness on palpation over her MCPs and PIPs. Also has thickening of bilateral CMC joints due to underlying osteoarthritis. She has swelling and warmth over bilateral ankle joints. She appears to be having a flare of her rheumatoid arthritis. She is on high risk prescription, taking methotrexate 6 tablets per week, which is not working as well for her. She could not increase her dose of methotrexate any further. She does have osteoarthritis, which causes some discomfort also. Different treatment options and their side effects were discussed at length. Discussed option of using combination therapy and adding plaquenil to her regimen. She was prescribed Plaquenil 200 mg 1 by mouth daily 90 day supply with 1 refill. Was advised to get eye exams to monitor for ocular toxicity. Orders given for labs every 3 months to monitor drug toxicity. They also discussed that most likely she would have to get on more aggressive therapy than just methotrexate and Plaquenil in the future. To cover that she checked TB cold, HIV, SPEP, immunoglobulins. Dr. Estanislado Pandy noted she is anemic and told her to follow-up with her PCP regarding that.  07/14/2015: She states that she has not been doing her  walking. Says that she's been hurting. Says that it just hasn't worked out. Says she is feeling better with the plaquenil. She is taking all blood pressure medicines as directed. No lower extremity edema. No lightheadedness. She is taking her Lipitor. No myalgias or other adverse effects. She is trying to be careful with her carbohydrates.   ADDENDUM ADDED  12/08/2015: Received OV note from Dr. Estanislado Pandy dated 12/02/2105. "She states she has been having increased pain and swelling in her hands. Due to her elevated creatinine, we had to decrease her methotrexate from 6 to 5 and now to 4 tablets per week and she states she has been having increased pain and swelling to the point she is having difficulty making a fist." "---She is taking Plaquenil 200mg  a day along with methotrexate, which is not controlling her disease. We had detailed discussion regarding her current situation. We will d/c methotrexate and start [place]. Continue the Plaquenil for now---"  01/12/2016: Says she is still having significant pain. Still unable to do her walking, exercise. She is taking all blood pressure medicines as directed. No lower extremity edema. No lightheadedness. She is taking her Lipitor. No myalgias or other adverse effects. She is trying to be careful with her carbohydrates.   Addendum Added 06/03/2016: Received OV note from Dr. Estanislado Pandy dated 05/25/2016. At that visit patient reported that she had been having flare of her arthritis.  She no longer felt like the Arava and Plaquenil were adequately addressing her rheumatoid arthritis.  At that visit they discussed Enbrel at length and the patient was agreeable to try biologic. Planned: Enbrel. If Enbrel fails, then try Economy while on Enbrel and stop the Plaquenil.  "Prescription for Pneumovax. Check with PCP when the last one was given and take if it is due" I reviewed her Immunizations---She received Prevnar 13---07/04/2013.                                              ----She received Pneumovax 23--06/23/2011, 08/02/2012   Past Medical History  Diagnosis Date  . Hypertension   . Normal cardiac stress test     pt can't remember when or where  . Diabetes mellitus without complication (HCC)     Type 2 NIDDM x 15 years  . Arthritis     osteoarthritis  . Hyperlipidemia   . Hormone  replacement therapy (postmenopausal)   . Fatty liver disease, nonalcoholic [id number]    hospitalized, MRCP dx fatty liver  . Rheumatoid arthritis(714.0) 09/28/2012  . Obesity, unspecified 12/27/2012  . GERD (gastroesophageal reflux disease)   . Chronic cholecystitis with calculus 05/04/2013  . Unspecified essential hypertension 05/04/2013  . Diabetes type 2, controlled ([place]) 05/04/2013  . Bursitis of right shoulder      Home Meds: Outpatient Prescriptions Prior to Visit  Medication Sig Dispense Refill  . amLODipine (NORVASC) 5 MG tablet TAKE 1 TABLET DAILY 90 tablet 0  . aspirin EC 81 MG tablet Take 81 mg by mouth daily.    Marland Kitchen atorvastatin (LIPITOR) 10 MG tablet TAKE 1 TABLET DAILY (NEW DOSE) 90 tablet 2  . Calcium Carbonate-Vitamin D (CALCIUM 600 + D PO) Take 1 tablet by mouth 2 (two) times daily.    . cloNIDine (CATAPRES) 0.3 MG tablet TAKE 1 TABLET TWICE A DAY 180 tablet 0  . cyanocobalamin 500 MCG tablet Take 1 tablet (500 mcg  total) by mouth daily. 30 tablet 0  . diclofenac sodium (VOLTAREN) 1 % GEL Apply 3 g topically daily as needed (pain). To 3 large joints    . estradiol (ESTRACE) 0.5 MG tablet Take 0.5 mg by mouth 2 (two) times daily.    . fish oil-omega-3 fatty acids 1000 MG capsule Take 1 g by mouth at bedtime.     . folic acid (FOLVITE) 1 MG tablet Take 1 tablet by mouth 2 (two) times daily.     . hydroxychloroquine (PLAQUENIL) 200 MG tablet Take 200 mg by mouth daily.  1  . leflunomide (ARAVA) 10 MG tablet Take 10 mg by mouth daily.    Marland Kitchen losartan-hydrochlorothiazide (HYZAAR) 100-12.5 MG tablet TAKE 1 TABLET DAILY 90 tablet 0  . metoprolol tartrate (LOPRESSOR) 25 MG tablet TAKE ONE-HALF (1/2) TABLET TWICE A DAY 90 tablet 0  . Multiple Vitamins-Minerals (MULTIVITAMINS THER. W/MINERALS) TABS Take 1 tablet by mouth daily.    Marland Kitchen omeprazole (PRILOSEC) 20 MG capsule TAKE 1 CAPSULE DAILY 90 capsule 3  . fluticasone (FLONASE) 50 MCG/ACT nasal spray Place 2 sprays into both nostrils daily.  (Patient not taking: Reported on 01/12/2016) 16 g 6  . doxycycline (VIBRA-TABS) 100 MG tablet Take 1 tablet (100 mg total) by mouth 2 (two) times daily. 14 tablet 0   No facility-administered medications prior to visit.    Allergies:  Allergies  Allergen Reactions  . Monopril [Fosinopril] Cough  . Norvasc [Amlodipine Besylate]     10 mg dose causes LE edema.  Tolerates 5mg  dose.    Social History   Social History  . Marital Status: Divorced    Spouse Name: N/A  . Number of Children: N/A  . Years of Education: N/A   Occupational History  . Not on file.   Social History Main Topics  . Smoking status: Former Smoker    Types: Cigarettes    Quit date: 11/21/2013  . Smokeless tobacco: Never Used     Comment: smoked regulary for approx 10 yrs- stopped in her 76's- but still smokes occ cig/Aug 2015  . Alcohol Use: Yes     Comment: occasional  . Drug Use: No  . Sexual Activity: No   Other Topics Concern  . Not on file   Social History Narrative    Family History  Problem Relation Age of Onset  . Diabetes Other   . Heart disease Other   . Cancer Mother     breast  . Heart attack Mother   . Stroke Father      Review of Systems:  See HPI for pertinent ROS. All other ROS negative.    Physical Exam: Blood pressure 142/88, pulse 80, temperature 98.1 F (36.7 C), temperature source Oral, resp. rate 18, weight 180 lb (81.647 kg)., Body mass index is 35.15 kg/(m^2). General: WNWD WF. Appears in no acute distress. Neck: Supple. No thyromegaly. No lymphadenopathy. No carotid bruit. Lungs: Clear bilaterally to auscultation without wheezes, rales, or rhonchi. Breathing is unlabored. Heart: RRR with S1 S2. No murmurs, rubs, or gallops. Abdomen: Soft, non-tender, non-distended with normoactive bowel sounds. No hepatomegaly. No rebound/guarding. No obvious abdominal masses. Musculoskeletal:  Strength and tone normal for age. Extremities/Skin: Warm and dry. Diabetic foot exam:  Inspection is normal. No deformities, no ulcerations no areas of skin breakdown bilaterally. Sensation is intact throughout. 2+ posterior tibialis pulses bilaterally. No palpable dorsalis pedis pulses bilaterally. Neuro: Alert and oriented X 3. Moves all extremities spontaneously. Gait is normal. CNII-XII grossly in  tact. Psych:  Responds to questions appropriately with a normal affect.     ASSESSMENT AND PLAN:  70 y.o. year old female with  1. Diabetes type 2, controlled  Last microalbumin  01/09/15, 01/12/2016  She is on aspirin 81 mg a day. She is on ARB. She is intolerant to ACE inhibitors secondary to cough. She is on statin. LDL has been at goal of less than 70.  She saw Ophthalmologist for diabetic eye exam in February 2017---"he says everything looks good" Foot exam is normal. She reports that she has needed no neuropathy symptoms.   2. Essential hypertension, benign Blood pressure a little high at OV 01/12/2016. However, reviewed that BP has been well controlled at recent OVs. 11/12/15--106/64. 07/2015--126/78.  Also, she says she has increased stresss at work--for past 2 weeks coworker has been out so she has been doing her work and the other coworkers work also. Also having pain with her RA. On ARB. Intolerant to ACE inhibitor. Check lab to monitor.   3. Hyperlipidemia On Lipitor.At Lab Result 05/22/14--decreased Lipitor dose to 20 mg . F/U  FLP 07/10/14---FLP Excellent.  Recheck labs again today to monitor. She is fasting.   4. Fatty liver disease, nonalcoholic LFTs have been stable.  5. Anemia, unspecified anemia type - CBC was done 05/22/14 and was stable.  6. Hormone replacement therapy (postmenopausal) Per Gyn  7. Rheumatoid arthritis(714.0) Per Dr. Estanislado Pandy  ADDENDUM--ADDED 05/21/2015: I received office note from Dr. Estanislado Pandy dated 05/16/15. The following is copied from that [place] note: "Pt reports that she has been experiencing increased pain and discomfort in  multiple joints. She has pain in her bilateral shoulders, bilateral wrist joints and hands. Tenderness on palpation over her MCPs and PIPs. Also has thickening of bilateral CMC joints due to underlying osteoarthritis. She has swelling and warmth over bilateral ankle joints. She appears to be having a flare of her rheumatoid arthritis. She is on high risk prescription, taking methotrexate 6 tablets per week, which is not working as well for her. She could not increase her dose of methotrexate any further. She does have osteoarthritis, which causes some discomfort also. Different treatment options and their side effects were discussed at length. Discussed option of using combination therapy and adding plaquenil to her regimen. She was prescribed Plaquenil 200 mg 1 by mouth daily 90 day supply with 1 refill. Was advised to get eye exams to monitor for ocular toxicity. Orders given for labs every 3 months to monitor drug toxicity. They also discussed that most likely she would have to get on more aggressive therapy than just methotrexate and Plaquenil in the future. To cover that she checked TB cold, HIV, SPEP, immunoglobulins. Dr. Estanislado Pandy noted she is anemic and told her to follow-up with her PCP regarding that.  ADDENDUM ADDED 12/08/2015: Received OV note from Dr. Estanislado Pandy dated 12/02/2105. "She states she has been having increased pain and swelling in her hands. Due to her elevated creatinine, we had to decrease her methotrexate from 6 to 5 and now to 4 tablets per week and she states she has been having increased pain and swelling to the point she is having difficulty making a fist." "---She is taking Plaquenil 200mg  a day along with methotrexate, which is not controlling her disease. We had detailed discussion regarding her current situation. We will d/c methotrexate and start [place]. Continue the Plaquenil for now---"  8. Gastroesophageal reflux disease, esophagitis presence not specified This is controlled  with current medication. 9. Multiple  pulmonary nodules She has been following up with Dr. Melvyn Novas at pulmonary. At [place] 05/22/14 she said that she is scheduled for "another scan in November". At [facility] 07/10/14--she had f/u scan November. Says there was no change and she is to f/u 6 months.  12/2014--Says at last check, no change. Dr. Melvyn Novas told her can wait 2 years to repeat scan.  10. Preventive Care   Colonoscopy: 04/30/2011 colon polyp. She says she was told to repeat 10 years.    Pap smear, mammogram, and BMD : Sees GYN annually and may manage this.  At [place] 11/21/2013 she reported that they do the mammograms there in the gynecology office itself. [name] this is at [facility]. I had her sign a release that day so that we could get those records to enter into our abstraction for quality metrics.    Immunizations:  Tdap: 05/2011  Pneumovax: 05/2011  Prevnar 13: 07/04/2013  Influenza vaccine she did receive this in the fall of 2014. Says she received influenza vaccine at work for free ---to cover 2015-2016 Flu Season. Received 05/2015 Zostavax: 09/2011  Until 12/2014, she had been having office as its with me every 3 months because she had been requiring diabetic medications. However at that time she was able to come off of diabetic medications. At that time, it was decided she can wait 6 months to have follow-up visit. However I did tell her to occasionally check her blood sugar just to make sure that it is not starting to drift upward. Routine followup office visit in 6 months or sooner if needed.    Marin Olp [place], [place], [facility] 01/12/2016 8:17 AM

## 2016-01-13 LAB — MICROALBUMIN, URINE: Microalb, Ur: 2.2 mg/dL

## 2016-01-13 LAB — HEMOGLOBIN A1C
HEMOGLOBIN A1C: 6.8 % — AB (ref <5.7)
Mean Plasma Glucose: 148 mg/dL

## 2016-01-20 ENCOUNTER — Ambulatory Visit (INDEPENDENT_AMBULATORY_CARE_PROVIDER_SITE_OTHER): Payer: BLUE CROSS/BLUE SHIELD | Admitting: Family Medicine

## 2016-01-20 ENCOUNTER — Encounter: Payer: Self-pay | Admitting: Family Medicine

## 2016-01-20 VITALS — BP 142/78 | HR 100 | Temp 98.9°F | Resp 22 | Ht 60.0 in | Wt 176.0 lb

## 2016-01-20 DIAGNOSIS — J189 Pneumonia, unspecified organism: Secondary | ICD-10-CM

## 2016-01-20 MED ORDER — CEFTRIAXONE SODIUM 1 G IJ SOLR
1.0000 g | Freq: Once | INTRAMUSCULAR | Status: AC
  Administered 2016-01-20: 1 g via INTRAMUSCULAR

## 2016-01-20 MED ORDER — AZITHROMYCIN 500 MG PO TABS: 500.0000 mg | ORAL_TABLET | Freq: Every day | ORAL | Status: DC

## 2016-01-20 MED ORDER — GUAIFENESIN-CODEINE 100-10 MG/5ML PO SOLN: 5.0000 mL | Freq: Four times a day (QID) | ORAL | Status: DC | PRN

## 2016-01-20 NOTE — Patient Instructions (Signed)
Take antibiotics as prescribed Use cough medicine Rest and fluids Call Friday if not improved and Chest xray will be done F/U as needed

## 2016-01-20 NOTE — Progress Notes (Signed)
Patient ID: [name], female   DOB: [birth year], 70 y.o.   MRN: [id number]    Subjective:    Patient ID: [name], female    DOB: [birth year], 70 y.o.   MRN: [id number]  Patient presents for Illness Patient here with cough with production of some lower rib pain. She's also had some fever at home. Her symptoms started last Friday and have progressed. She's taken some over-the-counter NyQuil with minimal improvement. Positive sick contact with grandchildren. She also works on a regular basis. Denies any chest pain denies any shortness of breath.    Review Of Systems:  GEN- denies fatigue, fever, weight loss,weakness, recent illness HEENT- denies eye drainage, change in vision, nasal discharge, CVS- denies chest pain, palpitations RESP- denies SOB, +cough, wheeze ABD- denies N/V, change in stools, abd pain GU- denies dysuria, hematuria, dribbling, incontinence MSK- denies joint pain, +muscle aches, injury Neuro- denies headache, dizziness, syncope, seizure activity       Objective:    BP 142/78 mmHg  Pulse 100  Temp(Src) 98.9 F (37.2 C) (Oral)  Resp 22  Ht 5' (1.524 m)  Wt 176 lb (79.833 kg)  BMI 34.37 kg/m2  SpO2 94% GEN- NAD, alert and oriented x3 HEENT- PERRL, EOMI, non injected sclera, pink conjunctiva, MMM, oropharynx clear Neck- Supple, no LAD  CVS- RRR, no murmur RESP-rales right base, bronchitic cough, no retractions, no wheeze,  EXT- No edema Pulses- Radial  2+        Assessment & Plan:      Problem List Items Addressed This Visit    None    Visit Diagnoses    CAP (community acquired pneumonia)    -  Primary    Concern CAP based on exam, lower sats, she has bronchitc cough as well. Treat wtih Rocephin 1 gram, Azithromycin, robitussin AC . She is on mild Suppressants for her rheumatoid arthritis. She does not improve she needs a chest x-ray     Relevant Medications    guaiFENesin-codeine 100-10 MG/5ML syrup    azithromycin (ZITHROMAX) 500 MG tablet     cefTRIAXone (ROCEPHIN) injection 1 g (Completed)       Note: This dictation was prepared with Dragon dictation along with smaller phrase technology. Any transcriptional errors that result from this process are unintentional.

## 2016-01-27 ENCOUNTER — Other Ambulatory Visit: Payer: Self-pay | Admitting: Obstetrics and Gynecology

## 2016-02-10 ENCOUNTER — Other Ambulatory Visit: Payer: Self-pay | Admitting: Internal Medicine

## 2016-02-10 DIAGNOSIS — R918 Other nonspecific abnormal finding of lung field: Secondary | ICD-10-CM

## 2016-02-20 ENCOUNTER — Telehealth: Payer: Self-pay | Admitting: Internal Medicine

## 2016-02-20 NOTE — Telephone Encounter (Signed)
Called spoke with [place]. She states that the order for the CT looks like it has been canceled. She states she is calling to clarifiy that the CT is still needed. I explained to her that I saw no phone message or message from [facility] canceling the CT. I also informed her that it shows it is still scheduled and that the order is still future. She states that she will contact [facility] for follow up. She voiced understanding and had no further questions.  Will route to [facility] as FTY

## 2016-02-25 ENCOUNTER — Ambulatory Visit (INDEPENDENT_AMBULATORY_CARE_PROVIDER_SITE_OTHER)
Admission: RE | Admit: 2016-02-25 | Discharge: 2016-02-25 | Disposition: A | Discharge status: Home / Self Care | Payer: BLUE CROSS/BLUE SHIELD | Source: Ambulatory Visit | Attending: Internal Medicine | Admitting: Internal Medicine

## 2016-02-25 DIAGNOSIS — R918 Other nonspecific abnormal finding of lung field: Secondary | ICD-10-CM

## 2016-02-26 ENCOUNTER — Other Ambulatory Visit: Payer: Self-pay | Admitting: Physician Assistant

## 2016-02-26 ENCOUNTER — Other Ambulatory Visit: Payer: Self-pay | Admitting: Internal Medicine

## 2016-02-26 DIAGNOSIS — E041 Nontoxic single thyroid nodule: Secondary | ICD-10-CM

## 2016-02-26 NOTE — Progress Notes (Signed)
Quick Note:  Spoke with pt and notified of results per Dr. Wert. Pt verbalized understanding and denied any questions.  ______ 

## 2016-02-26 NOTE — Telephone Encounter (Signed)
Refill appropriate and filled per protocol. 

## 2016-03-04 ENCOUNTER — Emergency Department (HOSPITAL_COMMUNITY): Payer: BLUE CROSS/BLUE SHIELD

## 2016-03-04 ENCOUNTER — Encounter (HOSPITAL_COMMUNITY): Payer: Self-pay | Admitting: Emergency Medicine

## 2016-03-04 ENCOUNTER — Ambulatory Visit (HOSPITAL_COMMUNITY)
Admission: RE | Admit: 2016-03-04 | Discharge: 2016-03-04 | Disposition: A | Discharge status: Home / Self Care | Payer: BLUE CROSS/BLUE SHIELD | Source: Ambulatory Visit | Attending: Internal Medicine | Admitting: Internal Medicine

## 2016-03-04 ENCOUNTER — Emergency Department (HOSPITAL_COMMUNITY)
Admission: EM | Admit: 2016-03-04 | Discharge: 2016-03-04 | Disposition: A | Discharge status: Home / Self Care | Payer: BLUE CROSS/BLUE SHIELD | Attending: Emergency Medicine | Admitting: Emergency Medicine

## 2016-03-04 DIAGNOSIS — M542 Cervicalgia: Secondary | ICD-10-CM | POA: Insufficient documentation

## 2016-03-04 DIAGNOSIS — Z7982 Long term (current) use of aspirin: Secondary | ICD-10-CM | POA: Diagnosis not present

## 2016-03-04 DIAGNOSIS — R918 Other nonspecific abnormal finding of lung field: Secondary | ICD-10-CM

## 2016-03-04 DIAGNOSIS — M25512 Pain in left shoulder: Secondary | ICD-10-CM | POA: Diagnosis present

## 2016-03-04 DIAGNOSIS — M5410 Radiculopathy, site unspecified: Secondary | ICD-10-CM | POA: Diagnosis not present

## 2016-03-04 DIAGNOSIS — M1990 Unspecified osteoarthritis, unspecified site: Secondary | ICD-10-CM | POA: Diagnosis not present

## 2016-03-04 DIAGNOSIS — E119 Type 2 diabetes mellitus without complications: Secondary | ICD-10-CM | POA: Insufficient documentation

## 2016-03-04 DIAGNOSIS — Z79899 Other long term (current) drug therapy: Secondary | ICD-10-CM | POA: Insufficient documentation

## 2016-03-04 DIAGNOSIS — Z791 Long term (current) use of non-steroidal anti-inflammatories (NSAID): Secondary | ICD-10-CM | POA: Insufficient documentation

## 2016-03-04 DIAGNOSIS — E785 Hyperlipidemia, unspecified: Secondary | ICD-10-CM | POA: Insufficient documentation

## 2016-03-04 DIAGNOSIS — E041 Nontoxic single thyroid nodule: Secondary | ICD-10-CM

## 2016-03-04 DIAGNOSIS — Z87891 Personal history of nicotine dependence: Secondary | ICD-10-CM | POA: Insufficient documentation

## 2016-03-04 DIAGNOSIS — M792 Neuralgia and neuritis, unspecified: Secondary | ICD-10-CM

## 2016-03-04 DIAGNOSIS — I10 Essential (primary) hypertension: Secondary | ICD-10-CM | POA: Diagnosis not present

## 2016-03-04 LAB — CBC
HCT: 36.4 % (ref 36.0–46.0)
Hemoglobin: 12.1 g/dL (ref 12.0–15.0)
MCH: 31.3 pg (ref 26.0–34.0)
MCHC: 33.2 g/dL (ref 30.0–36.0)
MCV: 94.3 fL (ref 78.0–100.0)
PLATELETS: 146 10*3/uL — AB (ref 150–400)
RBC: 3.86 MIL/uL — ABNORMAL LOW (ref 3.87–5.11)
RDW: 14.3 % (ref 11.5–15.5)
WBC: 5.2 10*3/uL (ref 4.0–10.5)

## 2016-03-04 LAB — BASIC METABOLIC PANEL WITH GFR
Anion gap: 10 (ref 5–15)
BUN: 22 mg/dL — ABNORMAL HIGH (ref 6–20)
CO2: 23 mmol/L (ref 22–32)
Calcium: 8.4 mg/dL — ABNORMAL LOW (ref 8.9–10.3)
Chloride: 106 mmol/L (ref 101–111)
Creatinine, Ser: 0.92 mg/dL (ref 0.44–1.00)
GFR calc Af Amer: >60 mL/min
GFR calc non Af Amer: >60 mL/min
Glucose, Bld: 120 mg/dL — ABNORMAL HIGH (ref 65–99)
Potassium: 3.3 mmol/L — ABNORMAL LOW (ref 3.5–5.1)
Sodium: 139 mmol/L (ref 135–145)

## 2016-03-04 LAB — I-STAT TROPONIN, ED
TROPONIN I, POC: 0 ng/mL (ref 0.00–0.08)
Troponin i, poc: 0 ng/mL (ref 0.00–0.08)

## 2016-03-04 NOTE — ED Notes (Signed)
EKG given to EDP,[place],MD., for review. 

## 2016-03-04 NOTE — ED Notes (Signed)
Pt to xray via WC

## 2016-03-04 NOTE — Discharge Instructions (Signed)
Please follow up with your primary doctor at the next available appointment for discussion of your diagnoses and further evaluation after today's visit; Please return to the ER for chest pain, difficulty breathing, nausea/vomiting, new or worsening symptoms, any additional concerns.

## 2016-03-04 NOTE — ED Provider Notes (Signed)
CSN: [id number]     Arrival date & time 03/04/16  0347 History   First MD Initiated Contact with Patient 03/04/16 0848     Chief Complaint  Patient presents with  . Shoulder Pain    (Consider location/radiation/quality/duration/timing/severity/associated sxs/prior Treatment) Patient is a 70 y.o. female presenting with shoulder pain. The history is provided by the patient and medical records. No language interpreter was used.  Shoulder Pain Associated symptoms: neck pain   Associated symptoms: no back pain and no fever      [name] is a 70 y.o. female  with a PMH of HTN, HLD, RA, DM controlled with diet only who presents to the Emergency Department complaining of left arm numbness and tingling that began in the middle of the night last night. Patient states she awoke from sleep with numbness/tingling of her shoulder that radiates down to fingertips. She endorses intermittent sharp pains diffusely of the left arm as well with certain movements. Also endorses left sided neck pain x 2-3 weeks. Denies hx of similar events. No medications or remedies PTA for symptoms. Denies chest pain, shortness of breath, n/v, diaphoresis, abdominal pain, or any other associated symptoms.    Past Medical History  Diagnosis Date  . Hypertension   . Normal cardiac stress test     pt can't remember when or where  . Diabetes mellitus without complication (HCC)     Type 2 NIDDM x 15 years  . Arthritis     osteoarthritis  . Hyperlipidemia   . Hormone replacement therapy (postmenopausal)   . Fatty liver disease, nonalcoholic [id number]    hospitalized, MRCP dx fatty liver  . Rheumatoid arthritis(714.0) 09/28/2012  . Obesity, unspecified 12/27/2012  . GERD (gastroesophageal reflux disease)   . Chronic cholecystitis with calculus 05/04/2013  . Unspecified essential hypertension 05/04/2013  . Diabetes type 2, controlled ([place]) 05/04/2013  . Bursitis of right shoulder    Past Surgical History  Procedure Laterality  Date  . Joint replacement      left knee  . Abdominal hysterectomy    . Tonsillectomy    . Nasal sinus surgery    . Total knee arthroplasty  08/01/2012    Procedure: TOTAL KNEE ARTHROPLASTY;  Surgeon: Meredith Pel, MD;  Location: [facility];  Service: Orthopedics;  Laterality: Right;  Right total knee arthroplasty  . Total knee arthroplasty  08/01/2012    RIGHT  KNEE  . Ercp N/A 05/02/2013    Procedure: ENDOSCOPIC RETROGRADE CHOLANGIOPANCREATOGRAPHY (ERCP);  Surgeon: Beryle Beams, MD;  Location: [facility];  Service: Endoscopy;  Laterality: N/A;  . Sphincterotomy  05/02/2013    Procedure: SPHINCTEROTOMY;  Surgeon: Beryle Beams, MD;  Location: [facility];  Service: Endoscopy;;  . Cholecystectomy N/A 05/04/2013    Procedure: LAPAROSCOPIC CHOLECYSTECTOMY WITH INTRAOPERATIVE CHOLANGIOGRAM;  Surgeon: Haywood Lasso, MD;  Location: [facility];  Service: General;  Laterality: N/A;   Family History  Problem Relation Age of Onset  . Diabetes Other   . Heart disease Other   . Cancer Mother     breast  . Heart attack Mother   . Stroke Father    Social History  Substance Use Topics  . Smoking status: Former Smoker    Types: Cigarettes    Quit date: 11/21/2013  . Smokeless tobacco: Never Used     Comment: smoked regulary for approx 10 yrs- stopped in her 12's- but still smokes occ cig/Aug 2015  . Alcohol Use: Yes     Comment: occasional  OB History    No data available     Review of Systems  Constitutional: Negative for fever and chills.  HENT: Negative for congestion.   Eyes: Negative for visual disturbance.  Respiratory: Negative for cough and shortness of breath.   Cardiovascular: Negative.   Gastrointestinal: Negative for nausea, vomiting and abdominal pain.  Genitourinary: Negative for dysuria.  Musculoskeletal: Positive for neck pain. Negative for back pain.  Skin: Negative for rash.  Neurological: Positive for numbness. Negative for weakness and headaches.       Allergies  Monopril and Norvasc  Home Medications   Prior to Admission medications   Medication Sig Start Date End Date Taking? Authorizing Provider  amLODipine (NORVASC) 5 MG tablet TAKE 1 TABLET DAILY 01/09/16  Yes Orlena Sheldon, PA-C  aspirin EC 81 MG tablet Take 81 mg by mouth daily.   Yes Historical Provider, MD  atorvastatin (LIPITOR) 10 MG tablet TAKE 1 TABLET DAILY (NEW DOSE) 01/09/16  Yes Susy Frizzle, MD  Calcium Carbonate-Vitamin D (CALCIUM 600 + D PO) Take 1 tablet by mouth 2 (two) times daily.   Yes Historical Provider, MD  cloNIDine (CATAPRES) 0.3 MG tablet TAKE 1 TABLET TWICE A DAY 01/09/16  Yes Lonie Peak Dixon, PA-C  cyanocobalamin 500 MCG tablet Take 1 tablet (500 mcg total) by mouth daily. 03/20/14  Yes Costin Karlyne Greenspan, MD  diclofenac sodium (VOLTAREN) 1 % GEL Apply 3 g topically daily as needed (pain). To 3 large joints   Yes Historical Provider, MD  estradiol (ESTRACE) 0.5 MG tablet Take 0.5 mg by mouth 2 (two) times daily.   Yes Historical Provider, MD  fish oil-omega-3 fatty acids 1000 MG capsule Take 1 g by mouth at bedtime.    Yes Historical Provider, MD  folic acid (FOLVITE) 1 MG tablet Take 1 tablet by mouth 2 (two) times daily.  12/08/12  Yes Historical Provider, MD  guaiFENesin-codeine 100-10 MG/5ML syrup Take 5 mLs by mouth every 6 (six) hours as needed. Patient taking differently: Take 5 mLs by mouth every 6 (six) hours as needed for cough.  01/20/16  Yes Alycia Rossetti, MD  hydroxychloroquine (PLAQUENIL) 200 MG tablet Take 200 mg by mouth daily. 06/15/15  Yes Historical Provider, MD  leflunomide (ARAVA) 10 MG tablet Take 10 mg by mouth daily.   Yes Historical Provider, MD  losartan-hydrochlorothiazide [facility]) 100-12.5 MG tablet TAKE 1 TABLET DAILY 01/09/16  Yes Orlena Sheldon, PA-C  metoprolol tartrate (LOPRESSOR) 25 MG tablet TAKE ONE-HALF (1/2) TABLET TWICE A DAY 02/26/16  Yes Orlena Sheldon, PA-C  Multiple Vitamins-Minerals (MULTIVITAMINS THER. W/MINERALS)  TABS Take 1 tablet by mouth daily.   Yes Historical Provider, MD  omeprazole (PRILOSEC) 20 MG capsule TAKE 1 CAPSULE DAILY 09/09/15  Yes Orlena Sheldon, PA-C  azithromycin (ZITHROMAX) 500 MG tablet Take 1 tablet (500 mg total) by mouth daily. Patient not taking: Reported on 03/04/2016 01/20/16   Alycia Rossetti, MD   BP 156/70 mmHg  Pulse 66  Temp(Src) 97.9 F (36.6 C) (Oral)  Resp 18  SpO2 98% Physical Exam  Constitutional: She is oriented to person, place, and time. She appears well-developed and well-nourished. No distress.  HENT:  Head: Normocephalic and atraumatic.  Neck:  Full ROM intact. TTP of left paraspinal musculature; also some mid-line tenderness but much less than paraspinal. No bony stepoffs or deformities. No bruising, erythema, or swelling.  Cardiovascular: Normal rate, regular rhythm, normal heart sounds and intact distal pulses.  Exam reveals no  gallop and no friction rub.   No murmur heard. Pulmonary/Chest: Effort normal and breath sounds normal. No respiratory distress. She has no wheezes. She has no rales. She exhibits no tenderness.  Abdominal: Soft. Bowel sounds are normal. She exhibits no distension. There is no tenderness.  Musculoskeletal:  5/5 muscle strength in upper and lower extremities bilaterally including strong and equal grip strength and dorsiflexion/plantar flexion.  Left shoulder: No TTP. Full ROM without pain. Negative empty can test, Neer's, Lift off. No swelling, erythema, or ecchymosis present. No step-off, crepitus, or deformity appreciated.   Neurological: She is alert and oriented to person, place, and time.  All four extremities neurovascularly intact.   Skin: Skin is warm and dry. No erythema.  Nursing note and vitals reviewed.   ED Course  Procedures (including critical care time) Labs Review Labs Reviewed  BASIC METABOLIC PANEL - Abnormal; Notable for the following:    Potassium 3.3 (*)    Glucose, Bld 120 (*)    BUN 22 (*)    Calcium  8.4 (*)    All other components within normal limits  CBC - Abnormal; Notable for the following:    RBC 3.86 (*)    Platelets 146 (*)    All other components within normal limits  I-STAT TROPOININ, ED  Randolm Idol, ED    Imaging Review Dg Chest 2 View  03/04/2016  CLINICAL DATA:  LEFT arm pain and numbness. History of hypertension and diabetes. EXAM: CHEST  2 VIEW COMPARISON:  CT chest February 25, 2016 FINDINGS: Cardiomediastinal silhouette is normal. The lungs are clear without pleural effusions or focal consolidations. Trachea projects midline and there is no pneumothorax. No new pulmonary nodules are not radiographically identified. Soft tissue planes and included osseous structures are non-suspicious. Mildly elevated RIGHT hemidiaphragm. Vascular calcifications in the neck. Surgical clips in the included right abdomen compatible with cholecystectomy. IMPRESSION: No acute cardiopulmonary process. Electronically Signed   By: Elon Alas M.D.   On: 03/04/2016 04:27   Dg Cervical Spine Complete  03/04/2016  CLINICAL DATA:  Lower posterior all neck pain, left arm tingling EXAM: CERVICAL SPINE - COMPLETE 4+ VIEW COMPARISON:  None. FINDINGS: Five views of the cervical spine submitted. No acute fracture or subluxation. There is mild disc space flattening with mild anterior spurring at C4-C5 level. Moderate disc space flattening with mild anterior spurring at C5-C6 and C6-C7 level. No prevertebral soft tissue swelling. Cervical airway is patent. C1-C2 relationship is unremarkable. Alignment and vertebral body heights are preserved. IMPRESSION: No acute fracture or subluxation. Degenerative changes as described above. Electronically Signed   By: Lahoma Crocker M.D.   On: 03/04/2016 09:51   I have personally reviewed and evaluated these images and lab results as part of my medical decision-making.   EKG Interpretation   Date/Time:  Thursday March 04 2016 03:55:43 EDT Ventricular Rate:  61 PR  Interval:    QRS Duration: 90 QT Interval:  435 QTC Calculation: 439 R Axis:   56 Text Interpretation:  Sinus rhythm Borderline T abnormalities, anterior  leads No acute changes No significant change since last tracing Confirmed  by Kathrynn Humble, MD, ANKIT [phone]) on 03/04/2016 9:25:24 AM      MDM   Final diagnoses:  Radicular pain in left arm   [name] presents to ED for left arm numbness/tingling that began last night. Also has been experiencing left paraspinal neck pain x 1-2 weeks. Afebrile, very well appearing and vss. No chest pain, sob, n/v, diaphoresis, abdominal  pain. 1st trop negative. Symptoms do not appear to be of cardiac origin - I believe it is more radicular in etiology, however will still get 2nd troponin.   Labs reviewed and reassuring. 2nd trop negative.  C-spine imaging with degenerative changes, but no acute changes. CXR normal. EKG reviewed with no significant change from previous.  Will discharge to home with symptomatic care instructions. PCP follow up strongly encouraged - patient has PCP and states she can follow up easily. Return precautions discussed and all questions answered.    Patient discussed with Dr. Kathrynn Humble who agrees with treatment plan.   [facility] Ward, PA-C 03/04/16 [place], MD 03/04/16 1247

## 2016-03-04 NOTE — ED Notes (Signed)
Pt transported from home by EMS for c/o L shoulder pain radiating down L arm with numbness onset just PTA. A & O

## 2016-03-05 DIAGNOSIS — E041 Nontoxic single thyroid nodule: Secondary | ICD-10-CM | POA: Insufficient documentation

## 2016-03-05 HISTORY — DX: Nontoxic single thyroid nodule: E04.1

## 2016-03-09 ENCOUNTER — Telehealth: Payer: Self-pay | Admitting: *Deleted

## 2016-03-09 DIAGNOSIS — R918 Other nonspecific abnormal finding of lung field: Secondary | ICD-10-CM

## 2016-03-09 NOTE — Telephone Encounter (Signed)
Ordered. Patient aware. Nothing further needed.

## 2016-03-09 NOTE — Telephone Encounter (Signed)
-----   Message from Tanda Rockers, MD sent at 03/09/2016  3:03 PM EDT ----- Ultrasound guided fine needle asp  ----- Message -----    From: Glean Hess, CMA    Sent: 03/09/2016   2:39 PM      To: Tanda Rockers, MD  Patient aware of Dr. Gustavus Bryant recommendations.  Dr. Melvyn Novas, please advise what type of biopsy you would like ordered.  Thanks.

## 2016-03-19 ENCOUNTER — Ambulatory Visit (INDEPENDENT_AMBULATORY_CARE_PROVIDER_SITE_OTHER): Payer: BLUE CROSS/BLUE SHIELD | Admitting: Family Medicine

## 2016-03-19 ENCOUNTER — Encounter: Payer: Self-pay | Admitting: Family Medicine

## 2016-03-19 VITALS — BP 112/76 | HR 78 | Temp 98.4°F | Resp 14 | Ht 60.0 in | Wt 183.0 lb

## 2016-03-19 DIAGNOSIS — M26622 Arthralgia of left temporomandibular joint: Secondary | ICD-10-CM | POA: Diagnosis not present

## 2016-03-19 MED ORDER — DIAZEPAM 10 MG PO TABS: 10.0000 mg | ORAL_TABLET | Freq: Three times a day (TID) | ORAL | 1 refills | Status: DC | PRN

## 2016-03-19 MED ORDER — DICLOFENAC SODIUM 75 MG PO TBEC: 75.0000 mg | DELAYED_RELEASE_TABLET | Freq: Two times a day (BID) | ORAL | 0 refills | Status: DC

## 2016-03-19 NOTE — Progress Notes (Signed)
Subjective:    Patient ID: [name], female    DOB: [birth year], 70 y.o.   MRN: [id number]  HPI Patient reports severe pain in her left ear and in her left neck. It will wake her up at night. She denies any fevers. She has some mild sinus congestion but she denies any rhinorrhea or postnasal drip. She does report some sinus pressure in her left frontal sinus. On examination today there is a small middle ear effusion of clear serous fluid but there is no erythema. Tympanic membrane is freely mobile and it is not bulging. There is no tenderness palpation over her sinuses. There is crepitus and some pain in the left TMJ Past Medical History:  Diagnosis Date  . Arthritis    osteoarthritis  . Bursitis of right shoulder   . Chronic cholecystitis with calculus 05/04/2013  . 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 Diabetes type 2, controlled ([place]) 05/04/2013  . Fatty liver disease, nonalcoholic [id number]   hospitalized, MRCP dx fatty liver  . GERD (gastroesophageal reflux disease)   . Hormone replacement therapy (postmenopausal)   . Hyperlipidemia   . Hypertension   . Normal cardiac stress test    pt can't remember when or where  . Obesity, unspecified 12/27/2012  . Rheumatoid arthritis(714.0) 09/28/2012  . Unspecified essential hypertension 05/04/2013   Past Surgical History:  Procedure Laterality Date  . ABDOMINAL HYSTERECTOMY    . CHOLECYSTECTOMY N/A 05/04/2013   Procedure: LAPAROSCOPIC CHOLECYSTECTOMY WITH INTRAOPERATIVE CHOLANGIOGRAM;  Surgeon: Haywood Lasso, MD;  Location: [facility];  Service: General;  Laterality: N/A;  . ERCP N/A 05/02/2013   Procedure: ENDOSCOPIC RETROGRADE CHOLANGIOPANCREATOGRAPHY (ERCP);  Surgeon: Beryle Beams, MD;  Location: [facility];  Service: Endoscopy;  Laterality: N/A;  . JOINT REPLACEMENT     left knee  . NASAL SINUS SURGERY    . SPHINCTEROTOMY  05/02/2013   Procedure: SPHINCTEROTOMY;  Surgeon: Beryle Beams, MD;   Location: [facility];  Service: Endoscopy;;  . TONSILLECTOMY    . TOTAL KNEE ARTHROPLASTY  08/01/2012   Procedure: TOTAL KNEE ARTHROPLASTY;  Surgeon: Meredith Pel, MD;  Location: [place];  Service: Orthopedics;  Laterality: Right;  Right total knee arthroplasty  . TOTAL KNEE ARTHROPLASTY  08/01/2012   RIGHT  KNEE   Current Outpatient Prescriptions on File Prior to Visit  Medication Sig Dispense Refill  . amLODipine (NORVASC) 5 MG tablet TAKE 1 TABLET DAILY 90 tablet 0  . aspirin EC 81 MG tablet Take 81 mg by mouth daily.    Marland Kitchen atorvastatin (LIPITOR) 10 MG tablet TAKE 1 TABLET DAILY (NEW DOSE) 90 tablet 2  . Calcium Carbonate-Vitamin D (CALCIUM 600 + D PO) Take 1 tablet by mouth 2 (two) times daily.    . cloNIDine (CATAPRES) 0.3 MG tablet TAKE 1 TABLET TWICE A DAY 180 tablet 0  . cyanocobalamin 500 MCG tablet Take 1 tablet (500 mcg total) by mouth daily. 30 tablet 0  . diclofenac sodium (VOLTAREN) 1 % GEL Apply 3 g topically daily as needed (pain). To 3 large joints    . estradiol (ESTRACE) 0.5 MG tablet Take 0.5 mg by mouth 2 (two) times daily.    . fish oil-omega-3 fatty acids 1000 MG capsule Take 1 g by mouth at bedtime.     . folic acid (FOLVITE) 1 MG tablet Take 1 tablet by mouth 2 (two) times daily.     . hydroxychloroquine (PLAQUENIL) 200 MG  tablet Take 200 mg by mouth daily.  1  . leflunomide (ARAVA) 10 MG tablet Take 10 mg by mouth daily.    Marland Kitchen losartan-hydrochlorothiazide (HYZAAR) 100-12.5 MG tablet TAKE 1 TABLET DAILY 90 tablet 0  . metoprolol tartrate (LOPRESSOR) 25 MG tablet TAKE ONE-HALF (1/2) TABLET TWICE A DAY 90 tablet 0  . Multiple Vitamins-Minerals (MULTIVITAMINS THER. W/MINERALS) TABS Take 1 tablet by mouth daily.    Marland Kitchen omeprazole (PRILOSEC) 20 MG capsule TAKE 1 CAPSULE DAILY 90 capsule 3   No current facility-administered medications on file prior to visit.    Allergies  Allergen Reactions  . Monopril [Fosinopril] Cough  . Norvasc [Amlodipine Besylate] Other (See  Comments)    10 mg dose causes LE edema.  Tolerates 5mg  dose.   Social History   Social History  . Marital status: Divorced    Spouse name: N/A  . Number of children: N/A  . Years of education: N/A   Occupational History  . Not on file.   Social History Main Topics  . Smoking status: Former Smoker    Types: Cigarettes    Quit date: 11/21/2013  . Smokeless tobacco: Never Used     Comment: smoked regulary for approx 10 yrs- stopped in her 55's- but still smokes occ cig/Aug 2015  . Alcohol use Yes     Comment: occasional  . Drug use: No  . Sexual activity: No   Other Topics Concern  . Not on file   Social History Narrative  . No narrative on file      Review of Systems  All other systems reviewed and are negative.      Objective:   Physical Exam  Constitutional: She appears well-developed and well-nourished.  HENT:  Right Ear: Tympanic membrane, external ear and ear canal normal.  Left Ear: Tympanic membrane, external ear and ear canal normal.  Nose: Nose normal. No mucosal edema or rhinorrhea.  Mouth/Throat: Oropharynx is clear and moist. No oropharyngeal exudate, posterior oropharyngeal edema, posterior oropharyngeal erythema or tonsillar abscesses.  Eyes: Conjunctivae are normal.  Neck: Normal range of motion. Neck supple. No JVD present.  Cardiovascular: Normal rate, regular rhythm and normal heart sounds.   Pulmonary/Chest: Effort normal and breath sounds normal. No respiratory distress. She has no wheezes. She has no rales.  Lymphadenopathy:    She has no cervical adenopathy.  Vitals reviewed.         Assessment & Plan:  Arthralgia of left temporomandibular joint - Plan: diclofenac (VOLTAREN) 75 MG EC tablet, diazepam (VALIUM) 10 MG tablet  Exam is unremarkable. I believe this is TMJ arthralgia. I recommended diclofenac 75 mg by mouth twice a day for 1 week. Take Valium 5-10 mg at night prior to sleep. Recheck next week to see if symptoms are  improving

## 2016-03-23 ENCOUNTER — Ambulatory Visit (HOSPITAL_COMMUNITY): Payer: BLUE CROSS/BLUE SHIELD

## 2016-03-23 DIAGNOSIS — M26609 Unspecified temporomandibular joint disorder, unspecified side: Secondary | ICD-10-CM

## 2016-03-23 HISTORY — DX: Unspecified temporomandibular joint disorder, unspecified side: M26.609

## 2016-03-29 ENCOUNTER — Other Ambulatory Visit: Payer: Self-pay | Admitting: General Surgery

## 2016-03-30 ENCOUNTER — Ambulatory Visit (HOSPITAL_COMMUNITY)
Admission: RE | Admit: 2016-03-30 | Discharge: 2016-03-30 | Disposition: A | Discharge status: Home / Self Care | Payer: BLUE CROSS/BLUE SHIELD | Source: Ambulatory Visit | Attending: Internal Medicine | Admitting: Internal Medicine

## 2016-03-30 ENCOUNTER — Encounter (HOSPITAL_COMMUNITY): Payer: Self-pay

## 2016-03-30 DIAGNOSIS — Z9049 Acquired absence of other specified parts of digestive tract: Secondary | ICD-10-CM | POA: Insufficient documentation

## 2016-03-30 DIAGNOSIS — R918 Other nonspecific abnormal finding of lung field: Secondary | ICD-10-CM | POA: Diagnosis present

## 2016-03-30 DIAGNOSIS — Z96651 Presence of right artificial knee joint: Secondary | ICD-10-CM | POA: Insufficient documentation

## 2016-03-30 DIAGNOSIS — Z8249 Family history of ischemic heart disease and other diseases of the circulatory system: Secondary | ICD-10-CM | POA: Diagnosis not present

## 2016-03-30 DIAGNOSIS — E042 Nontoxic multinodular goiter: Secondary | ICD-10-CM | POA: Diagnosis not present

## 2016-03-30 DIAGNOSIS — D34 Benign neoplasm of thyroid gland: Secondary | ICD-10-CM | POA: Diagnosis not present

## 2016-03-30 DIAGNOSIS — K760 Fatty (change of) liver, not elsewhere classified: Secondary | ICD-10-CM | POA: Insufficient documentation

## 2016-03-30 DIAGNOSIS — K219 Gastro-esophageal reflux disease without esophagitis: Secondary | ICD-10-CM | POA: Diagnosis not present

## 2016-03-30 DIAGNOSIS — M26609 Unspecified temporomandibular joint disorder, unspecified side: Secondary | ICD-10-CM | POA: Diagnosis not present

## 2016-03-30 DIAGNOSIS — E119 Type 2 diabetes mellitus without complications: Secondary | ICD-10-CM | POA: Diagnosis not present

## 2016-03-30 DIAGNOSIS — M069 Rheumatoid arthritis, unspecified: Secondary | ICD-10-CM | POA: Diagnosis not present

## 2016-03-30 DIAGNOSIS — Z87891 Personal history of nicotine dependence: Secondary | ICD-10-CM | POA: Insufficient documentation

## 2016-03-30 DIAGNOSIS — Z90710 Acquired absence of both cervix and uterus: Secondary | ICD-10-CM | POA: Insufficient documentation

## 2016-03-30 DIAGNOSIS — E785 Hyperlipidemia, unspecified: Secondary | ICD-10-CM | POA: Insufficient documentation

## 2016-03-30 DIAGNOSIS — E669 Obesity, unspecified: Secondary | ICD-10-CM | POA: Diagnosis not present

## 2016-03-30 DIAGNOSIS — I10 Essential (primary) hypertension: Secondary | ICD-10-CM | POA: Diagnosis not present

## 2016-03-30 DIAGNOSIS — Z833 Family history of diabetes mellitus: Secondary | ICD-10-CM | POA: Diagnosis not present

## 2016-03-30 DIAGNOSIS — Z823 Family history of stroke: Secondary | ICD-10-CM | POA: Diagnosis not present

## 2016-03-30 DIAGNOSIS — Z803 Family history of malignant neoplasm of breast: Secondary | ICD-10-CM | POA: Insufficient documentation

## 2016-03-30 DIAGNOSIS — Z888 Allergy status to other drugs, medicaments and biological substances status: Secondary | ICD-10-CM | POA: Diagnosis not present

## 2016-03-30 HISTORY — DX: Unspecified temporomandibular joint disorder, unspecified side: M26.609

## 2016-03-30 LAB — CBC
HEMATOCRIT: 37.6 % (ref 36.0–46.0)
HEMOGLOBIN: 12.2 g/dL (ref 12.0–15.0)
MCH: 31.2 pg (ref 26.0–34.0)
MCHC: 32.4 g/dL (ref 30.0–36.0)
MCV: 96.2 fL (ref 78.0–100.0)
Platelets: 179 10*3/uL (ref 150–400)
RBC: 3.91 MIL/uL (ref 3.87–5.11)
RDW: 14.5 % (ref 11.5–15.5)
WBC: 5.4 10*3/uL (ref 4.0–10.5)

## 2016-03-30 LAB — PROTIME-INR
INR: 1.04
PROTHROMBIN TIME: 13.6 s (ref 11.4–15.2)

## 2016-03-30 LAB — GLUCOSE, CAPILLARY: GLUCOSE-CAPILLARY: 115 mg/dL — AB (ref 65–99)

## 2016-03-30 LAB — APTT: APTT: 23 s — AB (ref 24–36)

## 2016-03-30 MED ORDER — SODIUM CHLORIDE 0.9 % IV SOLN
INTRAVENOUS | Status: DC
  Administered 2016-03-30: 11:00:00 via INTRAVENOUS

## 2016-03-30 MED ORDER — MIDAZOLAM HCL 2 MG/2ML IJ SOLN
INTRAMUSCULAR | Status: AC
  Filled 2016-03-30: qty 6

## 2016-03-30 MED ORDER — MIDAZOLAM HCL 2 MG/2ML IJ SOLN
INTRAMUSCULAR | Status: AC | PRN
  Administered 2016-03-30: 1 mg via INTRAVENOUS

## 2016-03-30 MED ORDER — FENTANYL CITRATE (PF) 100 MCG/2ML IJ SOLN
INTRAMUSCULAR | Status: AC
  Filled 2016-03-30: qty 4

## 2016-03-30 MED ORDER — FENTANYL CITRATE (PF) 100 MCG/2ML IJ SOLN
INTRAMUSCULAR | Status: AC | PRN
  Administered 2016-03-30: 50 ug via INTRAVENOUS

## 2016-03-30 NOTE — Discharge Instructions (Signed)
Moderate Conscious Sedation, Adult, Care After °Refer to this sheet in the next few weeks. These instructions provide you with information on caring for yourself after your procedure. Your health care provider may also give you more specific instructions. Your treatment has been planned according to current medical practices, but problems sometimes occur. Call your health care provider if you have any problems or questions after your procedure. °WHAT TO EXPECT AFTER THE PROCEDURE  °After your procedure: °· You may feel sleepy, clumsy, and have poor balance for several hours. °· Vomiting may occur if you eat too soon after the procedure. °HOME CARE INSTRUCTIONS °· Do not participate in any activities where you could become injured for at least 24 hours. Do not: °¨ Drive. °¨ Swim. °¨ Ride a bicycle. °¨ Operate heavy machinery. °¨ Cook. °¨ Use power tools. °¨ Climb ladders. °¨ Work from a high place. °· Do not make important decisions or sign legal documents until you are improved. °· If you vomit, drink water, juice, or soup when you can drink without vomiting. Make sure you have little or no nausea before eating solid foods. °· Only take over-the-counter or prescription medicines for pain, discomfort, or fever as directed by your health care provider. °· Make sure you and your family fully understand everything about the medicines given to you, including what side effects may occur. °· You should not drink alcohol, take sleeping pills, or take medicines that cause drowsiness for at least 24 hours. °· If you smoke, do not smoke without supervision. °· If you are feeling better, you may resume normal activities 24 hours after you were sedated. °· Keep all appointments with your health care provider. °SEEK MEDICAL CARE IF: °· Your skin is pale or bluish in color. °· You continue to feel nauseous or vomit. °· Your pain is getting worse and is not helped by medicine. °· You have bleeding or swelling. °· You are still  sleepy or feeling clumsy after 24 hours. °SEEK IMMEDIATE MEDICAL CARE IF: °· You develop a rash. °· You have difficulty breathing. °· You develop any type of allergic problem. °· You have a fever. °MAKE SURE YOU: °· Understand these instructions. °· Will watch your condition. °· Will get help right away if you are not doing well or get worse. °  °This information is not intended to replace advice given to you by your health care provider. Make sure you discuss any questions you have with your health care provider. °  °Document Released: 05/30/2013 Document Revised: 08/30/2014 Document Reviewed: 05/30/2013 °Elsevier Interactive Patient Education ©2016 [employer] ° ° °

## 2016-03-30 NOTE — H&P (Signed)
Chief Complaint: multiple thyroid nodules  Referring Physician:Dr. Christinia Gully  Supervising Physician: Sandi Mariscal  Patient Status: Out-pt  HPI: [name] is an 70 y.o. female who has a history of pulmonary nodules.  She is followed by Dr. Melvyn Novas.  She underwent a recent CT of the chest to follow up on these nodules and had an incidental find of some thyroid nodules.  She had a dedicated thyroid US which confirmed  isthmus and left mid pole nodules.  She has presented today for a biopsy to be completed.  Past Medical History:  Past Medical History:  Diagnosis Date  . Arthritis    osteoarthritis  . Bursitis of right shoulder   . Chronic cholecystitis with calculus 05/04/2013  . 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 Diabetes type 2, controlled ([place]) 05/04/2013  . Fatty liver disease, nonalcoholic [id number]   hospitalized, MRCP dx fatty liver  . GERD (gastroesophageal reflux disease)   . Hormone replacement therapy (postmenopausal)   . Hyperlipidemia   . Hypertension   . Normal cardiac stress test    pt can't remember when or where  . Obesity, unspecified 12/27/2012  . Rheumatoid arthritis(714.0) 09/28/2012  . 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 Unspecified essential hypertension 05/04/2013    Past Surgical History:  Past Surgical History:  Procedure Laterality Date  . ABDOMINAL HYSTERECTOMY    . CHOLECYSTECTOMY N/A 05/04/2013   Procedure: LAPAROSCOPIC CHOLECYSTECTOMY WITH INTRAOPERATIVE CHOLANGIOGRAM;  Surgeon: Haywood Lasso, MD;  Location: [facility];  Service: General;  Laterality: N/A;  . ERCP N/A 05/02/2013   Procedure: ENDOSCOPIC RETROGRADE CHOLANGIOPANCREATOGRAPHY (ERCP);  Surgeon: Beryle Beams, MD;  Location: [facility];  Service: Endoscopy;  Laterality: N/A;  . JOINT REPLACEMENT     left knee  . NASAL SINUS SURGERY    . SPHINCTEROTOMY  05/02/2013   Procedure: SPHINCTEROTOMY;  Surgeon: Beryle Beams, MD;  Location: [facility];  Service: Endoscopy;;  . TONSILLECTOMY    . TOTAL KNEE ARTHROPLASTY  08/01/2012   Procedure: TOTAL KNEE ARTHROPLASTY;  Surgeon: Meredith Pel, MD;  Location: [facility];  Service: Orthopedics;  Laterality: Right;  Right total knee arthroplasty  . TOTAL KNEE ARTHROPLASTY  08/01/2012   RIGHT  KNEE    Family History:  Family History  Problem Relation Age of Onset  . Cancer Mother     breast  . Heart attack Mother   . Stroke Father   . Diabetes Other   . Heart disease Other     Social History:  reports that she quit smoking about 2 years ago. Her smoking use included Cigarettes. She has never used smokeless tobacco. She reports that she drinks alcohol. She reports that she does not use drugs.  Allergies:  Allergies  Allergen Reactions  . Monopril [Fosinopril] Cough  . Norvasc [Amlodipine Besylate] Other (See Comments)    10 mg dose causes LE edema.  Tolerates 5mg  dose.    Medications: Medications reviewed in Epic  Please HPI for pertinent positives, otherwise complete 10 system ROS negative.  Mallampati Score: MD Evaluation Airway: WNL Heart: WNL Abdomen: WNL Chest/ Lungs: WNL ASA  Classification: 3 Mallampati/Airway Score: Two  Physical Exam: BP (!) 154/61 (BP Location: Right Arm)   Pulse 66   Temp 98.2 F (36.8 C) (Oral)   Resp 16   SpO2 99%  There is no height or weight on file to calculate BMI. General: pleasant, WD, WN white female  who is laying in bed in NAD HEENT: head is normocephalic, atraumatic.  Sclera are noninjected.  PERRL.  Ears and nose without any masses or lesions.  Mouth is pink and moist Heart: regular, rate, and rhythm.  Normal s1,s2. No obvious murmurs, gallops, or rubs noted.  Palpable radial and pedal pulses bilaterally Lungs: CTAB, no wheezes, rhonchi, or rales noted.  Respiratory effort nonlabored Psych: A&Ox3 with an appropriate affect.   Labs: Results for orders placed or performed during the hospital encounter of 03/30/16  (from the past 48 hour(s))  APTT upon arrival     Status: Abnormal   Collection Time: 03/30/16 11:15 AM  Result Value Ref Range   aPTT 23 (L) 24 - 36 seconds  CBC upon arrival     Status: None   Collection Time: 03/30/16 11:15 AM  Result Value Ref Range   WBC 5.4 4.0 - 10.5 K/uL   RBC 3.91 3.87 - 5.11 MIL/uL   Hemoglobin 12.2 12.0 - 15.0 g/dL   HCT 37.6 36.0 - 46.0 %   MCV 96.2 78.0 - 100.0 fL   MCH 31.2 26.0 - 34.0 pg   MCHC 32.4 30.0 - 36.0 g/dL   RDW 14.5 11.5 - 15.5 %   Platelets 179 150 - 400 K/uL  Protime-INR upon arrival     Status: None   Collection Time: 03/30/16 11:15 AM  Result Value Ref Range   Prothrombin Time 13.6 11.4 - 15.2 seconds   INR 1.04     Imaging: No results found.  Assessment/Plan 1. Multiple thyroid nodules -we will plan to proceed with a biopsy today.   -labs and vitals have been reviewed -Risks and Benefits discussed with the patient including, but not limited to bleeding, infection, damage to adjacent structures or low yield requiring additional tests. All of the patient's questions were answered, patient is agreeable to proceed. Consent signed and in chart.   Thank you for this interesting consult.  I greatly enjoyed meeting Yevonne Soncrant and look forward to participating in their care.  A copy of this report was sent to the requesting provider on this date.  Electronically Signed: Henreitta Cea 03/30/2016, 11:52 AM   I spent a total of  15 Minutes   in face to face in clinical consultation, greater than 50% of which was counseling/coordinating care for multiple thyroid nodules

## 2016-03-30 NOTE — Procedures (Signed)
Technically successful US guided biopsy of dominant nodules within the isthmus and left lobe of the thyroid.    EBL: Minimal    No immediate complications.   Ronny Bacon, MD Pager #: [phone]

## 2016-04-01 NOTE — Progress Notes (Signed)
Spoke with pt and notified of results per Dr. Wert. Pt verbalized understanding and denied any questions. 

## 2016-04-02 ENCOUNTER — Other Ambulatory Visit: Payer: Self-pay | Admitting: Physician Assistant

## 2016-04-02 NOTE — Telephone Encounter (Signed)
Medication refilled per protocol. 

## 2016-05-03 ENCOUNTER — Ambulatory Visit (INDEPENDENT_AMBULATORY_CARE_PROVIDER_SITE_OTHER): Payer: BLUE CROSS/BLUE SHIELD | Admitting: Physician Assistant

## 2016-05-03 ENCOUNTER — Encounter: Payer: Self-pay | Admitting: Physician Assistant

## 2016-05-03 ENCOUNTER — Ambulatory Visit: Payer: BLUE CROSS/BLUE SHIELD | Admitting: Physician Assistant

## 2016-05-03 VITALS — BP 128/76 | HR 68 | Temp 97.7°F | Resp 16 | Wt 187.0 lb

## 2016-05-03 DIAGNOSIS — E1165 Type 2 diabetes mellitus with hyperglycemia: Secondary | ICD-10-CM | POA: Diagnosis not present

## 2016-05-03 DIAGNOSIS — S91109A Unspecified open wound of unspecified toe(s) without damage to nail, initial encounter: Secondary | ICD-10-CM

## 2016-05-03 MED ORDER — CLINDAMYCIN HCL 300 MG PO CAPS: 300.0000 mg | ORAL_CAPSULE | Freq: Three times a day (TID) | ORAL | 0 refills | Status: DC

## 2016-05-03 MED ORDER — FLUCONAZOLE 150 MG PO TABS: 150.0000 mg | ORAL_TABLET | Freq: Once | ORAL | 0 refills | Status: AC

## 2016-05-03 NOTE — Progress Notes (Signed)
Patient ID: [name] MRN: [id number], DOB: [birth year], 70 y.o. Date of Encounter: 05/03/2016, 11:51 AM    Chief Complaint:  Chief Complaint  Patient presents with  . office visit    right toe pain, swelling started 1 week ago     HPI: 70 y.o. year old female presents with above.   Says that about 1-1/2 weeks ago the top of her right fifth toe look like there was a blister on it. Says she "popped it but then it filled back up".  "Popped it again but it is again filled up again". Now the top of the toe is covered with scab area. Given her diabetes and the fact that this has been there for 1 to 1-1/2 weeks, she felt that she should come in. As far as she knows, is unaware of any trauma or injury to the toe. As well she is not aware of wearing any shoes that would have caused a blister. Says that anytime she has done a lot of walking she has used her tennis shoes.     Home Meds:   Outpatient Medications Prior to Visit  Medication Sig Dispense Refill  . amLODipine (NORVASC) 5 MG tablet TAKE 1 TABLET DAILY 90 tablet 1  . aspirin EC 81 MG tablet Take 81 mg by mouth daily.    Marland Kitchen atorvastatin (LIPITOR) 10 MG tablet TAKE 1 TABLET DAILY (NEW DOSE) 90 tablet 2  . Calcium Carbonate-Vitamin D (CALCIUM 600 + D PO) Take 1 tablet by mouth 2 (two) times daily.    . cloNIDine (CATAPRES) 0.3 MG tablet TAKE 1 TABLET TWICE A DAY 180 tablet 1  . cyanocobalamin 500 MCG tablet Take 1 tablet (500 mcg total) by mouth daily. 30 tablet 0  . diclofenac sodium (VOLTAREN) 1 % GEL Apply 3 g topically daily as needed (pain). To 3 large joints    . estradiol (ESTRACE) 0.5 MG tablet Take 0.5 mg by mouth 2 (two) times daily.    . fish oil-omega-3 fatty acids 1000 MG capsule Take 1 g by mouth at bedtime.     . folic acid (FOLVITE) 1 MG tablet Take 1 tablet by mouth 2 (two) times daily.     . hydroxychloroquine (PLAQUENIL) 200 MG tablet Take 200 mg by mouth daily.  1  . leflunomide (ARAVA) 10 MG tablet Take 10 mg  by mouth daily.    Marland Kitchen losartan-hydrochlorothiazide (HYZAAR) 100-12.5 MG tablet TAKE 1 TABLET DAILY 90 tablet 1  . metoprolol tartrate (LOPRESSOR) 25 MG tablet TAKE ONE-HALF (1/2) TABLET TWICE A DAY 90 tablet 0  . Multiple Vitamins-Minerals (MULTIVITAMINS THER. W/MINERALS) TABS Take 1 tablet by mouth daily.    Marland Kitchen omeprazole (PRILOSEC) 20 MG capsule TAKE 1 CAPSULE DAILY 90 capsule 3  . diazepam (VALIUM) 10 MG tablet Take 1 tablet (10 mg total) by mouth every 8 (eight) hours as needed (muscle spasms). (Patient not taking: Reported on 05/03/2016) 30 tablet 1  . diclofenac (VOLTAREN) 75 MG EC tablet Take 1 tablet (75 mg total) by mouth 2 (two) times daily. 30 tablet 0   No facility-administered medications prior to visit.     Allergies:  Allergies  Allergen Reactions  . Monopril [Fosinopril] Cough  . Norvasc [Amlodipine Besylate] Other (See Comments)    10 mg dose causes LE edema.  Tolerates 5mg  dose.      Review of Systems: See HPI for pertinent ROS. All other ROS negative.    Physical Exam: Blood pressure 128/76, pulse 68, temperature  97.7 F (36.5 C), temperature source Oral, resp. rate 16, weight 187 lb (84.8 kg)., Body mass index is 36.52 kg/m. General:  WF. Appears in no acute distress. Neck: Supple. No thyromegaly. No lymphadenopathy. Lungs: Clear bilaterally to auscultation without wheezes, rales, or rhonchi. Breathing is unlabored. Heart: Regular rhythm. No murmurs, rubs, or gallops. Msk:  Strength and tone normal for age. Extremities/Skin: Right 5th toe---Dorsal surface of toe--covered with golden crusty scab. There is no surrounding erythema/cellulitis. There is no drainage. Plantar surface of the toe and the sides of the toe appear normal. Underneath the scab, appears very shallow wound does not look deep. Neuro: Alert and oriented X 3. Moves all extremities spontaneously. Gait is normal. CNII-XII grossly in tact. Psych:  Responds to questions appropriately with a normal  affect.     ASSESSMENT AND PLAN:  70 y.o. year old female with  1. Non-healing open wound of toe, initial encounter She is to take the clindamycin as directed. She states that she does have family members at home who can look at the wound daily and monitor it. She is to follow-up with me in one week. She is to follow-up sooner if it is looking worse at all in the interim.  She said when she takes antibiotics it causes yeast infection so I'm sending her Diflucan to take if indicated.  - clindamycin (CLEOCIN) 300 MG capsule; Take 1 capsule (300 mg total) by mouth 3 (three) times daily.  Dispense: 30 capsule; Refill: 0 - fluconazole (DIFLUCAN) 150 MG tablet; Take 1 tablet (150 mg total) by mouth once.  Dispense: 1 tablet; Refill: 0  2. Controlled type 2 diabetes mellitus with hyperglycemia, without long-term current use of insulin [facility]   Signed, [address] [place], [place], [facility] 05/03/2016 11:51 AM

## 2016-05-04 ENCOUNTER — Other Ambulatory Visit: Payer: Self-pay | Admitting: Physician Assistant

## 2016-05-04 NOTE — Telephone Encounter (Signed)
Medication refilled per protocol. 

## 2016-05-12 ENCOUNTER — Encounter: Payer: Self-pay | Admitting: Physician Assistant

## 2016-05-12 ENCOUNTER — Ambulatory Visit (INDEPENDENT_AMBULATORY_CARE_PROVIDER_SITE_OTHER): Payer: BLUE CROSS/BLUE SHIELD | Admitting: Physician Assistant

## 2016-05-12 VITALS — BP 126/78 | HR 64 | Temp 97.8°F | Resp 16 | Wt 187.0 lb

## 2016-05-12 DIAGNOSIS — S91109D Unspecified open wound of unspecified toe(s) without damage to nail, subsequent encounter: Secondary | ICD-10-CM | POA: Diagnosis not present

## 2016-05-12 NOTE — Progress Notes (Signed)
Patient ID: [name] MRN: [id number], DOB: 06-11-1946, 70 y.o. Date of Encounter: 05/12/2016, 8:38 AM    Chief Complaint:  Chief Complaint  Patient presents with  . Follow-up     HPI: 70 y.o. year old female presents with above.   05/03/2016: Says that about 1-1/2 weeks ago the top of her right fifth toe look like there was a blister on it. Says she "popped it but then it filled back up".  "Popped it again but it is again filled up again". Now the top of the toe is covered with scab area. Given her diabetes and the fact that this has been there for 1 to 1-1/2 weeks, she felt that she should come in. As far as she knows, is unaware of any trauma or injury to the toe. As well she is not aware of wearing any shoes that would have caused a blister. Says that anytime she has done a lot of walking she has used her tennis shoes.  At that OV--Rxed Clindamycin and told her to f/u 1 week.  05/12/2016: Here for f/u. Says that she has almost completed all of the antibiotics. Says that her toe looks like it is healing. No other complaints or concerns.  Home Meds:   Outpatient Medications Prior to Visit  Medication Sig Dispense Refill  . amLODipine (NORVASC) 5 MG tablet TAKE 1 TABLET DAILY 90 tablet 1  . aspirin EC 81 MG tablet Take 81 mg by mouth daily.    Marland Kitchen atorvastatin (LIPITOR) 10 MG tablet TAKE 1 TABLET DAILY (NEW DOSE) 90 tablet 2  . Calcium Carbonate-Vitamin D (CALCIUM 600 + D PO) Take 1 tablet by mouth 2 (two) times daily.    . clindamycin (CLEOCIN) 300 MG capsule Take 1 capsule (300 mg total) by mouth 3 (three) times daily. 30 capsule 0  . cloNIDine (CATAPRES) 0.3 MG tablet TAKE 1 TABLET TWICE A DAY 180 tablet 1  . cyanocobalamin 500 MCG tablet Take 1 tablet (500 mcg total) by mouth daily. 30 tablet 0  . diazepam (VALIUM) 10 MG tablet Take 1 tablet (10 mg total) by mouth every 8 (eight) hours as needed (muscle spasms). 30 tablet 1  . diclofenac (VOLTAREN) 75 MG EC tablet Take 1  tablet (75 mg total) by mouth 2 (two) times daily. 30 tablet 0  . diclofenac sodium (VOLTAREN) 1 % GEL Apply 3 g topically daily as needed (pain). To 3 large joints    . estradiol (ESTRACE) 0.5 MG tablet Take 0.5 mg by mouth 2 (two) times daily.    . fish oil-omega-3 fatty acids 1000 MG capsule Take 1 g by mouth at bedtime.     . folic acid (FOLVITE) 1 MG tablet Take 1 tablet by mouth 2 (two) times daily.     . hydroxychloroquine (PLAQUENIL) 200 MG tablet Take 200 mg by mouth daily.  1  . leflunomide (ARAVA) 10 MG tablet Take 10 mg by mouth daily.    Marland Kitchen losartan-hydrochlorothiazide (HYZAAR) 100-12.5 MG tablet TAKE 1 TABLET DAILY 90 tablet 1  . metoprolol tartrate (LOPRESSOR) 25 MG tablet TAKE ONE-HALF (1/2) TABLET TWICE A DAY 90 tablet 0  . Multiple Vitamins-Minerals (MULTIVITAMINS THER. W/MINERALS) TABS Take 1 tablet by mouth daily.    Marland Kitchen omeprazole (PRILOSEC) 20 MG capsule TAKE 1 CAPSULE DAILY 90 capsule 3   No facility-administered medications prior to visit.     Allergies:  Allergies  Allergen Reactions  . Monopril [Fosinopril] Cough  . Norvasc [Amlodipine Besylate] Other (  See Comments)    10 mg dose causes LE edema.  Tolerates 5mg  dose.      Review of Systems: See HPI for pertinent ROS. All other ROS negative.    Physical Exam: Blood pressure 126/78, pulse 64, temperature 97.8 F (36.6 C), temperature source Oral, resp. rate 16, weight 187 lb (84.8 kg)., Body mass index is 36.52 kg/m. General:  WF. Appears in no acute distress. Neck: Supple. No thyromegaly. No lymphadenopathy. Lungs: Clear bilaterally to auscultation without wheezes, rales, or rhonchi. Breathing is unlabored. Heart: Regular rhythm. No murmurs, rubs, or gallops. Msk:  Strength and tone normal for age. Extremities/Skin: Right 5th toe---Dorsal surface of toe-- Almost all of scab has healed/resolved. Underlying skin looks normal.  No sign of infection. No erythema, no drainage.  Neuro: Alert and oriented X 3.  Moves all extremities spontaneously. Gait is normal. CNII-XII grossly in tact. Psych:  Responds to questions appropriately with a normal affect.     ASSESSMENT AND PLAN:  70 y.o. year old female with  1. Non-healing open wound of toe, initial encounter Complete the and the mycin that was prescribed at last office visit. She completes that no further treatment is indicated at this time. To monitor the toe closely. If any open wound develops or any sign of infection including redness drainage , then follow-up immediately. Patient voices understanding and agrees. She already has her next routine visit scheduled with me for November and will keep that appointment. Otherwise follow-up sooner if needed. 2. Controlled type 2 diabetes mellitus with hyperglycemia, without long-term current use of insulin [facility])   Signed, [address] [place], [place], [facility] 05/12/2016 8:38 AM

## 2016-05-25 ENCOUNTER — Ambulatory Visit (INDEPENDENT_AMBULATORY_CARE_PROVIDER_SITE_OTHER): Payer: BLUE CROSS/BLUE SHIELD | Admitting: Rheumatology

## 2016-05-25 DIAGNOSIS — M79671 Pain in right foot: Secondary | ICD-10-CM

## 2016-05-25 DIAGNOSIS — Z09 Encounter for follow-up examination after completed treatment for conditions other than malignant neoplasm: Secondary | ICD-10-CM

## 2016-05-25 DIAGNOSIS — M79642 Pain in left hand: Secondary | ICD-10-CM

## 2016-05-25 DIAGNOSIS — M0579 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 without organ or systems involvement: Secondary | ICD-10-CM

## 2016-05-25 DIAGNOSIS — M79672 Pain in left foot: Secondary | ICD-10-CM

## 2016-05-25 DIAGNOSIS — M79641 Pain in right hand: Secondary | ICD-10-CM

## 2016-05-25 DIAGNOSIS — M810 Age-related osteoporosis without current pathological fracture: Secondary | ICD-10-CM | POA: Diagnosis not present

## 2016-06-02 ENCOUNTER — Ambulatory Visit (INDEPENDENT_AMBULATORY_CARE_PROVIDER_SITE_OTHER): Payer: BLUE CROSS/BLUE SHIELD | Admitting: Family Medicine

## 2016-06-02 DIAGNOSIS — Z111 Encounter for screening for respiratory tuberculosis: Secondary | ICD-10-CM

## 2016-06-03 ENCOUNTER — Other Ambulatory Visit (INDEPENDENT_AMBULATORY_CARE_PROVIDER_SITE_OTHER): Payer: Self-pay | Admitting: Rheumatology

## 2016-06-03 ENCOUNTER — Ambulatory Visit (HOSPITAL_COMMUNITY)
Admission: RE | Admit: 2016-06-03 | Discharge: 2016-06-03 | Disposition: A | Discharge status: Home / Self Care | Payer: BLUE CROSS/BLUE SHIELD | Source: Ambulatory Visit | Attending: Rheumatology | Admitting: Rheumatology

## 2016-06-03 DIAGNOSIS — M0520 Rheumatoid vasculitis with rheumatoid arthritis of unspecified site: Secondary | ICD-10-CM

## 2016-06-03 DIAGNOSIS — Z01818 Encounter for other preprocedural examination: Secondary | ICD-10-CM | POA: Insufficient documentation

## 2016-06-04 ENCOUNTER — Ambulatory Visit: Payer: BLUE CROSS/BLUE SHIELD

## 2016-06-04 DIAGNOSIS — Z111 Encounter for screening for respiratory tuberculosis: Secondary | ICD-10-CM

## 2016-06-04 LAB — TB SKIN TEST
Induration: 0 mm
TB Skin Test: NEGATIVE

## 2016-06-14 NOTE — Progress Notes (Signed)
Cxr normal

## 2016-06-15 ENCOUNTER — Telehealth: Payer: Self-pay | Admitting: Radiology

## 2016-06-15 ENCOUNTER — Telehealth: Payer: Self-pay | Admitting: Rheumatology

## 2016-06-15 MED ORDER — ETANERCEPT 50 MG/ML ~~LOC~~ SOAJ: 50.0000 mg | SUBCUTANEOUS | 2 refills | Status: DC

## 2016-06-15 NOTE — Telephone Encounter (Signed)
Patient has been approved for Enbrel and we need to send rx in and schedule a nurse visit. Cb# [phone]

## 2016-06-15 NOTE — Telephone Encounter (Signed)
I have called patient to advise of Normal chest xray

## 2016-06-15 NOTE — Telephone Encounter (Signed)
Thank you, her PPD was neg per PCP office / she will call us back to make a new start appointment.

## 2016-06-25 ENCOUNTER — Ambulatory Visit (INDEPENDENT_AMBULATORY_CARE_PROVIDER_SITE_OTHER): Payer: BLUE CROSS/BLUE SHIELD | Admitting: Radiology

## 2016-06-25 ENCOUNTER — Encounter: Payer: Self-pay | Admitting: Radiology

## 2016-06-25 VITALS — BP 128/76 | HR 76 | Resp 16

## 2016-06-25 DIAGNOSIS — M0579 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 without organ or systems involvement: Secondary | ICD-10-CM

## 2016-06-25 DIAGNOSIS — Z79899 Other long term (current) drug therapy: Secondary | ICD-10-CM | POA: Diagnosis not present

## 2016-06-25 MED ORDER — ETANERCEPT 50 MG/ML ~~LOC~~ SOAJ
50.0000 mg | Freq: Once | SUBCUTANEOUS | Status: AC
  Administered 2016-06-25: 50 mg via SUBCUTANEOUS

## 2016-06-25 NOTE — Patient Instructions (Signed)
Return in one month for labs/ then every three months  Etanercept injection What is this medicine? ETANERCEPT (et a [employer]) is used for the treatment of rheumatoid arthritis in adults and children. The medicine is also used to treat psoriatic arthritis, ankylosing spondylitis, and psoriasis. This medicine may be used for other purposes; ask your health care provider or pharmacist if you have questions. What should I tell my health care provider before I take this medicine? They need to know if you have any of these conditions: -blood disorders -cancer -congestive heart failure -diabetes -exposure to chickenpox -immune system problems -infection -multiple sclerosis -seizure disorder -tuberculosis, a positive skin test for tuberculosis or have recently been in close contact with someone who has tuberculosis -Wegener's granulomatosis -an unusual or allergic reaction to etanercept, latex, other medicines, foods, dyes, or preservatives -pregnant or trying to get pregnant -breast-feeding How should I use this medicine? The medicine is given by injection under the skin. You will be taught how to prepare and give this medicine. Use exactly as directed. Take your medicine at regular intervals. Do not take your medicine more often than directed. It is important that you put your used needles and syringes in a special sharps container. Do not put them in a trash can. If you do not have a sharps container, call your pharmacist or healthcare provider to get one. A special MedGuide will be given to you by the pharmacist with each prescription and refill. Be sure to read this information carefully each time. Talk to your pediatrician regarding the use of this medicine in children. While this drug may be prescribed for children as young as 71 years of age for selected conditions, precautions do apply. Overdosage: If you think you have taken too much of this medicine contact a poison control center or  emergency room at once. NOTE: This medicine is only for you. Do not share this medicine with others. What if I miss a dose? If you miss a dose, contact your health care professional to find out when you should take your next dose. Do not take double or extra doses without advice. What may interact with this medicine? Do not take this medicine with any of the following medications: -anakinra This medicine may also interact with the following medications: -cyclophosphamide -sulfasalazine -vaccines This list may not describe all possible interactions. Give your health care provider a list of all the medicines, herbs, non-prescription drugs, or dietary supplements you use. Also tell them if you smoke, drink alcohol, or use illegal drugs. Some items may interact with your medicine. What should I watch for while using this medicine? Tell your doctor or healthcare professional if your symptoms do not start to get better or if they get worse. You will be tested for tuberculosis (TB) before you start this medicine. If your doctor prescribes any medicine for TB, you should start taking the TB medicine before starting this medicine. Make sure to finish the full course of TB medicine. Call your doctor or health care professional for advice if you get a fever, chills or sore throat, or other symptoms of a cold or flu. Do not treat yourself. This drug decreases your body's ability to fight infections. Try to avoid being around people who are sick. What side effects may I notice from receiving this medicine? Side effects that you should report to your doctor or health care professional as soon as possible: -allergic reactions like skin rash, itching or hives, swelling of the face, lips,  or tongue -changes in vision -fever, chills or any other sign of infection -numbness or tingling in legs or other parts of the body -red, scaly patches or raised bumps on the skin -shortness of breath or difficulty  breathing -swollen lymph nodes in the neck, underarm, or groin areas -unexplained weight loss -unusual bleeding or bruising -unusual swelling or fluid retention in the legs -unusually weak or tired Side effects that usually do not require medical attention (report to your doctor or health care professional if they continue or are bothersome): -dizziness -headache -nausea -redness, itching, or swelling at the injection site -vomiting This list may not describe all possible side effects. Call your doctor for medical advice about side effects. You may report side effects to FDA at 1-800-FDA-1088. Where should I keep my medicine? Keep out of the reach of children. Store between 2 and 8 degrees C (36 and 46 degrees F). Do not freeze or shake. Protect from light. Throw away any unused medicine after the expiration date. You will be instructed on how to store this medicine. NOTE: This sheet is a summary. It may not cover all possible information. If you have questions about this medicine, talk to your doctor, pharmacist, or health care provider.    2016, [employer]. (2012-02-14 15:33:36)

## 2016-06-25 NOTE — Progress Notes (Signed)
Patient has presented for new start Enbrel. Patient was instructed on proper use of Enbrel Sure click pen. She states she will have no difficulty self injecting. Patient tolerated injection well, was monitored for 30 minutes for reaction. Patient advised to return for labs in one month, then every three months, orders placed.

## 2016-07-19 ENCOUNTER — Encounter: Payer: Self-pay | Admitting: Physician Assistant

## 2016-07-19 ENCOUNTER — Ambulatory Visit (INDEPENDENT_AMBULATORY_CARE_PROVIDER_SITE_OTHER): Payer: BLUE CROSS/BLUE SHIELD | Admitting: Physician Assistant

## 2016-07-19 VITALS — BP 124/80 | HR 73 | Temp 97.8°F | Resp 16 | Wt 184.0 lb

## 2016-07-19 DIAGNOSIS — E119 Type 2 diabetes mellitus without complications: Secondary | ICD-10-CM

## 2016-07-19 DIAGNOSIS — I10 Essential (primary) hypertension: Secondary | ICD-10-CM | POA: Diagnosis not present

## 2016-07-19 DIAGNOSIS — K219 Gastro-esophageal reflux disease without esophagitis: Secondary | ICD-10-CM | POA: Diagnosis not present

## 2016-07-19 DIAGNOSIS — E785 Hyperlipidemia, unspecified: Secondary | ICD-10-CM | POA: Diagnosis not present

## 2016-07-19 DIAGNOSIS — B9689 Other specified bacterial agents as the cause of diseases classified elsewhere: Secondary | ICD-10-CM

## 2016-07-19 DIAGNOSIS — J988 Other specified respiratory disorders: Secondary | ICD-10-CM

## 2016-07-19 LAB — COMPLETE METABOLIC PANEL WITH GFR
ALBUMIN: 3.4 g/dL — AB (ref 3.6–5.1)
ALK PHOS: 113 U/L (ref 33–130)
ALT: 16 U/L (ref 6–29)
AST: 24 U/L (ref 10–35)
BILIRUBIN TOTAL: 0.6 mg/dL (ref 0.2–1.2)
BUN: 14 mg/dL (ref 7–25)
CALCIUM: 8.9 mg/dL (ref 8.6–10.4)
CO2: 30 mmol/L (ref 20–31)
Chloride: 101 mmol/L (ref 98–110)
Creat: 0.97 mg/dL — ABNORMAL HIGH (ref 0.60–0.93)
GFR, EST NON AFRICAN AMERICAN: 59 mL/min — AB (ref >=60)
GFR, Est African American: 67 mL/min (ref >=60)
GLUCOSE: 146 mg/dL — AB (ref 70–99)
Potassium: 3.7 mmol/L (ref 3.5–5.3)
SODIUM: 143 mmol/L (ref 135–146)
TOTAL PROTEIN: 6.5 g/dL (ref 6.1–8.1)

## 2016-07-19 LAB — MAGNESIUM: Magnesium: 1.3 mg/dL — ABNORMAL LOW (ref 1.5–2.5)

## 2016-07-19 LAB — LIPID PANEL
CHOLESTEROL: 156 mg/dL (ref <200)
HDL: 70 mg/dL (ref >50)
LDL Cholesterol: 63 mg/dL (ref <100)
Total CHOL/HDL Ratio: 2.2 Ratio (ref <5.0)
Triglycerides: 114 mg/dL (ref <150)
VLDL: 23 mg/dL (ref <30)

## 2016-07-19 LAB — HEMOGLOBIN A1C
HEMOGLOBIN A1C: 6.3 % — AB (ref <5.7)
MEAN PLASMA GLUCOSE: 134 mg/dL

## 2016-07-19 MED ORDER — HYDROCODONE-HOMATROPINE 5-1.5 MG/5ML PO SYRP: 5.0000 mL | ORAL_SOLUTION | Freq: Three times a day (TID) | ORAL | 0 refills | Status: DC | PRN

## 2016-07-19 MED ORDER — AZITHROMYCIN 250 MG PO TABS: ORAL_TABLET | ORAL | 0 refills | Status: DC

## 2016-07-19 NOTE — Progress Notes (Signed)
Patient ID: Brad Falardeau MRN: [id number], DOB: [birth year], 70 y.o. Date of Encounter: @DATE @  Chief Complaint:  Chief Complaint  Patient presents with  . Cough    x 1 week    HPI: 70 y.o. year old white female  presents for routine followup office visit.    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  She had a lot of medical problems in 2014-2015.   Approx 06/2013 she had laparoscopic cholecystectomy.   Her next routine office visit with me was 11/21/2013. She had a long hospitalization---10/15/13-10/21/2013-- with symptoms of mostly vomiting. Many many tests are performed. The patient stated that no definitive diagnosis could ever be confirmed and it was felt that she just has some nonspecific virus.  She had another hospitalization  03/16/14 - 03/19/14. During that hospitalization, she was found to have a pulmonary nodule. Since then she has been having follow-up with Dr. Melvyn Novas.    05/22/2014 she reported that she has been feeling good and has gotten back to her walking. [name] that she walks with her granddaughter and her baby. Says  they  "walk whenever they can"-- which usually is about 3-5 times per week. At all of her visits with me since then, she has continued to tell me that she has continued to do these walks with her granddaughter and her baby whenever the weather is good. Again at her visit today 12/2014 she reports this.  01/09/2015: she says this is the same---still walking same amount. Still feeling good.  She is taking her blood pressure medications as directed but no adverse effects. No lower extremity edema and no lightheadedness. She is taking Lipitor at 20 mg. No myalgias or other adverse effects. Last A1C 10/10/14--A1C was down to 5.7. Metformin was discontinued completely. She says that she did stop this. She says that she has not been checking her blood sugar.Says that they did have a screening at her work and at that time her fasting sugar  was 115.   Rheumatoid arthritis(714.0) Per Dr. Estanislado Pandy ADDENDUM--ADDED 05/21/2015: I received office note from Dr. Estanislado Pandy dated 05/16/15. The following is copied from that [facility] note: "Pt reports that she has been experiencing increased pain and discomfort in multiple joints. She has pain in her bilateral shoulders, bilateral wrist joints and hands. Tenderness on palpation over her MCPs and PIPs. Also has thickening of bilateral CMC joints due to underlying osteoarthritis. She has swelling and warmth over bilateral ankle joints. She appears to be having a flare of her rheumatoid arthritis. She is on high risk prescription, taking methotrexate 6 tablets per week, which is not working as well for her. She could not increase her dose of methotrexate any further. She does have osteoarthritis, which causes some discomfort also. Different treatment options and their side effects were discussed at length. Discussed option of using combination therapy and adding plaquenil to her regimen. She was prescribed Plaquenil 200 mg 1 by mouth daily 90 day supply with 1 refill. Was advised to get eye exams to monitor for ocular toxicity. Orders given for labs every 3 months to monitor drug toxicity. They also discussed that most likely she would have to get on more aggressive therapy than just methotrexate and Plaquenil in the future. To cover that she checked TB cold, HIV, SPEP, immunoglobulins. Dr. Estanislado Pandy noted she is anemic and told her to follow-up with her PCP regarding that.  07/14/2015: She states that she has not been doing her walking.  Says that she's been hurting. Says that it just hasn't worked out. Says she is feeling better with the plaquenil. She is taking all blood pressure medicines as directed. No lower extremity edema. No lightheadedness. She is taking her Lipitor. No myalgias or other adverse effects. She is trying to be careful with her carbohydrates.   ADDENDUM ADDED 12/08/2015: Received OV note  from Dr. Estanislado Pandy dated 12/02/2105. "She states she has been having increased pain and swelling in her hands. Due to her elevated creatinine, we had to decrease her methotrexate from 6 to 5 and now to 4 tablets per week and she states she has been having increased pain and swelling to the point she is having difficulty making a fist." "---She is taking Plaquenil 200mg  a day along with methotrexate, which is not controlling her disease. We had detailed discussion regarding her current situation. We will d/c methotrexate and start [place]. Continue the Plaquenil for now---"  01/12/2016: Says she is still having significant pain. Still unable to do her walking, exercise. She is taking all blood pressure medicines as directed. No lower extremity edema. No lightheadedness. She is taking her Lipitor. No myalgias or other adverse effects. She is trying to be careful with her carbohydrates.   Addendum Added 06/03/2016: Received OV note from Dr. Estanislado Pandy dated 05/25/2016. At that visit patient reported that she had been having flare of her arthritis.  She no longer felt like the Arava and Plaquenil were adequately addressing her rheumatoid arthritis.  At that visit they discussed Enbrel at length and the patient was agreeable to try biologic. Planned: Enbrel. If Enbrel fails, then try [place] while on Enbrel and stop the Plaquenil.  "Prescription for Pneumovax. Check with PCP when the last one was given and take if it is due" I reviewed her Immunizations---She received Prevnar 13---07/04/2013.                                              ----She received Pneumovax 23--06/23/2011, 08/02/2012   07/19/2016: She states that she thinks that the Enbrel is starting to work some. Says that her hands and other joints don't feel quite as stiff and feel like they are moving a little bit more. She states that she has had this bad cough for a couple weeks now. Says that about a month ago she had cold  symptoms and stuffy nose but then that got better but the cough has lingered and now the cough has been worse for about 1-1/2-2 weeks now. Says that her daughter lives with her---her daughter recently had community-acquired pneumonia.  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  She is taking blood pressure medications as directed. No lightheadedness or other adverse effects.  She is taking Lipitor as directed. No myalgias or other adverse effects.  She is being careful with her diet and following low carbohydrate diet. Exercise is limited secondary to her arthritis.  Past Medical History:  Diagnosis Date  . Arthritis    osteoarthritis  . Bursitis of right shoulder   . Chronic cholecystitis with calculus 05/04/2013  . 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  Diabetes type 2, controlled ([place]) 05/04/2013  . Fatty liver disease, nonalcoholic [id number]   hospitalized, MRCP dx fatty liver  . GERD (gastroesophageal reflux disease)   . Hormone replacement therapy (postmenopausal)   . Hyperlipidemia   . Hypertension   . Normal cardiac stress test    pt can't remember when or where  . Obesity, unspecified 12/27/2012  . Rheumatoid arthritis(714.0) 09/28/2012  . 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 Unspecified essential hypertension 05/04/2013     Home Meds: Outpatient Medications Prior to Visit  Medication Sig Dispense Refill  . amLODipine (NORVASC) 5 MG tablet TAKE 1 TABLET DAILY 90 tablet 1  . aspirin EC 81 MG tablet Take 81 mg by mouth daily.    Marland Kitchen atorvastatin (LIPITOR) 10 MG tablet TAKE 1 TABLET DAILY (NEW DOSE) 90 tablet 2  . Calcium Carbonate-Vitamin D (CALCIUM 600 + D PO) Take 1 tablet by mouth 2 (two) times daily.    . cloNIDine (CATAPRES) 0.3 MG tablet TAKE 1 TABLET TWICE A  DAY 180 tablet 1  . cyanocobalamin 500 MCG tablet Take 1 tablet (500 mcg total) by mouth daily. 30 tablet 0  . diazepam (VALIUM) 10 MG tablet Take 1 tablet (10 mg total) by mouth every 8 (eight) hours as needed (muscle spasms). 30 tablet 1  . diclofenac sodium (VOLTAREN) 1 % GEL Apply 3 g topically daily as needed (pain). To 3 large joints    . estradiol (ESTRACE) 0.5 MG tablet Take 0.5 mg by mouth 2 (two) times daily.    Marland Kitchen etanercept (ENBREL SURECLICK) 50 MG/ML injection Inject 0.98 mLs (50 mg total) into the skin once a week. 3.92 mL 2  . fish oil-omega-3 fatty acids 1000 MG capsule Take 1 g by mouth at bedtime.     . folic acid (FOLVITE) 1 MG tablet Take 1 tablet by mouth 2 (two) times daily.     Marland Kitchen leflunomide (ARAVA) 10 MG tablet Take 10 mg by mouth daily.    Marland Kitchen losartan-hydrochlorothiazide (HYZAAR) 100-12.5 MG tablet TAKE 1 TABLET DAILY 90 tablet 1  . metoprolol tartrate (LOPRESSOR) 25 MG tablet TAKE ONE-HALF (1/2) TABLET TWICE A DAY 90 tablet 0  . Multiple Vitamins-Minerals (MULTIVITAMINS THER. W/MINERALS) TABS Take 1 tablet by mouth daily.    Marland Kitchen omeprazole (PRILOSEC) 20 MG capsule TAKE 1 CAPSULE DAILY 90 capsule 3  . clindamycin (CLEOCIN) 300 MG capsule Take 1 capsule (300 mg total) by mouth 3 (three) times daily. (Patient not taking: Reported on 07/19/2016) 30 capsule 0  . diclofenac (VOLTAREN) 75 MG EC tablet Take 1 tablet (75 mg total) by mouth 2 (two) times daily. (Patient not taking: Reported on 07/19/2016) 30 tablet 0  . hydroxychloroquine (PLAQUENIL) 200 MG tablet Take 200 mg by mouth daily.  1   No facility-administered medications prior to visit.     Allergies:  Allergies  Allergen Reactions  . Monopril [Fosinopril] Cough  . Norvasc [Amlodipine Besylate] Other (See Comments)    10 mg dose causes LE edema.  Tolerates 5mg  dose.    Social History   Social History  . Marital status: Divorced    Spouse name: N/A  . Number of children: N/A  . Years of education: N/A    Occupational History  . Not on file.   Social History Main Topics  . Smoking status: Former Smoker    Types: Cigarettes    Quit date: 11/21/2013  . Smokeless tobacco: Never Used     Comment: smoked regulary for approx 10 yrs-  stopped in her 48's- but still smokes occ cig/Aug 2015  . Alcohol use Yes     Comment: occasional  . Drug use: No  . Sexual activity: No   Other Topics Concern  . Not on file   Social History Narrative  . No narrative on file    Family History  Problem Relation Age of Onset  . Cancer Mother     breast  . Heart attack Mother   . Stroke Father   . Diabetes Other   . Heart disease Other      Review of Systems:  See HPI for pertinent ROS. All other ROS negative.    Physical Exam: Blood pressure 124/80, pulse 73, temperature 97.8 F (36.6 C), temperature source Oral, resp. rate 16, weight 184 lb (83.5 kg), SpO2 95 %., Body mass index is 35.94 kg/m. General: WNWD WF. Appears in no acute distress. Neck: Supple. No thyromegaly. No lymphadenopathy. No carotid bruit. Lungs: Clear bilaterally to auscultation without wheezes, rales, or rhonchi. Breathing is unlabored. Heart: RRR with S1 S2. No murmurs, rubs, or gallops. Abdomen: Soft, non-tender, non-distended with normoactive bowel sounds. No hepatomegaly. No rebound/guarding. No obvious abdominal masses. Musculoskeletal:  Strength and tone normal for age. Extremities/Skin: Warm and dry. Diabetic foot exam: Inspection is normal. No deformities, no ulcerations no areas of skin breakdown bilaterally. Sensation is intact throughout. 2+ posterior tibialis pulses bilaterally. No palpable dorsalis pedis pulses bilaterally. Neuro: Alert and oriented X 3. Moves all extremities spontaneously. Gait is normal. CNII-XII grossly in tact. Psych:  Responds to questions appropriately with a normal affect.     ASSESSMENT AND PLAN:  70 y.o. year old female with   Bacterial respiratory infection 07/19/2016:  Take  antibiotic as directed. F/U if cough does not resolve within one week of completion of antibiotic. Given that daughter had CAP, discussed obtaining CXR but she defers.    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 Return Wednesday           - azithromycin (ZITHROMAX) 250 MG tablet; Day 1: take 2 daily. Days 2-5: Take 1 daily.  Dispense: 6 tablet; Refill: 0           - HYDROcodone-homatropine (HYCODAN) 5-1.5 MG/5ML syrup; Take 5 mLs by mouth every 8 (eight) hours as needed for cough.  Dispense: 120 mL; Refill: 0  Hypomagnesemia 07/19/2016: In past--checked Mg b/c she was on PPI---Mg was low---recheck now. - Magnesium  1. Diabetes type 2, controlled  Last microalbumin  01/09/15, 01/12/2016  She is on aspirin 81 mg a day. She is on ARB. She is intolerant to ACE inhibitors secondary to cough. She is on statin. LDL has been at goal of less than 70.  She saw Ophthalmologist for diabetic eye exam in February 2017---"he says everything looks good" Foot exam is normal. She reports that she has needed no neuropathy symptoms.  07/19/2016: Check HgbA1C  2. Essential hypertension,  07/19/2016: At goal/controlled.   On ARB. Intolerant to ACE inhibitor.   Continue current medications. Check lab to monitor.   3. Hyperlipidemia On Lipitor.At Lab Result 05/22/14--decreased Lipitor dose to 20 mg . F/U  FLP 07/10/14---FLP Excellent.  07/19/2016-----Recheck labs again today to monitor. She is fasting.   4. Fatty liver disease, nonalcoholic 0000000: LFTs have been stable. Recheck LFTs now  5. Anemia, unspecified anemia type - CBC was done 05/22/14 and was stable.  6. Hormone replacement therapy (postmenopausal) Per Gyn  7. Rheumatoid arthritis(714.0) Per Dr. Estanislado Pandy  ADDENDUM--ADDED 05/21/2015: I received  office note from Dr. Estanislado Pandy dated 05/16/15. The following is copied from that [facility] note: "Pt reports that she has been experiencing increased pain and discomfort in multiple joints. She  has pain in her bilateral shoulders, bilateral wrist joints and hands. Tenderness on palpation over her MCPs and PIPs. Also has thickening of bilateral CMC joints due to underlying osteoarthritis. She has swelling and warmth over bilateral ankle joints. She appears to be having a flare of her rheumatoid arthritis. She is on high risk prescription, taking methotrexate 6 tablets per week, which is not working as well for her. She could not increase her dose of methotrexate any further. She does have osteoarthritis, which causes some discomfort also. Different treatment options and their side effects were discussed at length. Discussed option of using combination therapy and adding plaquenil to her regimen. She was prescribed Plaquenil 200 mg 1 by mouth daily 90 day supply with 1 refill. Was advised to get eye exams to monitor for ocular toxicity. Orders given for labs every 3 months to monitor drug toxicity. They also discussed that most likely she would have to get on more aggressive therapy than just methotrexate and Plaquenil in the future. To cover that she checked TB cold, HIV, SPEP, immunoglobulins. Dr. Estanislado Pandy noted she is anemic and told her to follow-up with her PCP regarding that.  ADDENDUM ADDED 12/08/2015: Received OV note from Dr. Estanislado Pandy dated 12/02/2105. "She states she has been having increased pain and swelling in her hands. Due to her elevated creatinine, we had to decrease her methotrexate from 6 to 5 and now to 4 tablets per week and she states she has been having increased pain and swelling to the point she is having difficulty making a fist." "---She is taking Plaquenil 200mg  a day along with methotrexate, which is not controlling her disease. We had detailed discussion regarding her current situation. We will d/c methotrexate and start [place]. Continue the Plaquenil for now---"  8. Gastroesophageal reflux disease, esophagitis presence not specified 07/19/2016: This is controlled with  current medication.  9. Multiple pulmonary nodules She has been following up with Dr. Melvyn Novas at pulmonary. At [place] 05/22/14 she said that she is scheduled for "another scan in November". At [place] 07/10/14--she had f/u scan November. Says there was no change and she is to f/u 6 months.  12/2014--Says at last check, no change. Dr. Melvyn Novas told her can wait 2 years to repeat scan.  10. Preventive Care   Colonoscopy: 04/30/2011 colon polyp. She says she was told to repeat 10 years.    Pap smear, mammogram, and BMD : Sees GYN annually and may manage this.  At [place] 11/21/2013 she reported that they do the mammograms there in the gynecology office itself. [name] this is at [facility]. I had her sign a release that day so that we could get those records to enter into our abstraction for quality metrics.    Immunizations:  Tdap: 05/2011  Pneumovax: 05/2011  Prevnar 13: 07/04/2013  Influenza vaccine she did receive this in the fall of 2014. Says she received influenza vaccine at work for free ---to cover 2015-2016 Flu Season. Received 05/2015/ Received 2017 Zostavax: 09/2011  Until 12/2014, she had been having office as its with me every 3 months because she had been requiring diabetic medications. However at that time she was able to come off of diabetic medications. At that time, it was decided she can wait 6 months to have follow-up visit. However I did tell her  to occasionally check her blood sugar just to make sure that it is not starting to drift upward. Routine followup office visit in 6 months or sooner if needed.    Marin Olp [place], [place], [facility] 07/19/2016 8:29 AM

## 2016-07-21 ENCOUNTER — Telehealth: Payer: Self-pay

## 2016-07-21 HISTORY — DX: Hypomagnesemia: E83.42

## 2016-07-21 MED ORDER — MAGNESIUM CHLORIDE 64 MG PO TBEC: 2.0000 | DELAYED_RELEASE_TABLET | Freq: Every day | ORAL | 5 refills | Status: DC

## 2016-07-21 NOTE — Telephone Encounter (Signed)
Added magnesium chloride

## 2016-07-30 ENCOUNTER — Other Ambulatory Visit: Payer: Self-pay | Admitting: *Deleted

## 2016-07-30 DIAGNOSIS — Z79899 Other long term (current) drug therapy: Secondary | ICD-10-CM

## 2016-07-30 LAB — CBC WITH DIFFERENTIAL/PLATELET
BASOS ABS: 0 {cells}/uL (ref 0–200)
Basophils Relative: 0 %
EOS ABS: 369 {cells}/uL (ref 15–500)
Eosinophils Relative: 9 %
HEMATOCRIT: 39.4 % (ref 35.0–45.0)
HEMOGLOBIN: 12.6 g/dL (ref 11.7–15.5)
Lymphocytes Relative: 24 %
Lymphs Abs: 984 cells/uL (ref 850–3900)
MCH: 30.1 pg (ref 27.0–33.0)
MCHC: 32 g/dL (ref 32.0–36.0)
MCV: 94 fL (ref 80.0–100.0)
MONO ABS: 615 {cells}/uL (ref 200–950)
MONOS PCT: 15 %
MPV: 11.6 fL (ref 7.5–12.5)
NEUTROS ABS: 2132 {cells}/uL (ref 1500–7800)
Neutrophils Relative %: 52 %
PLATELETS: 193 10*3/uL (ref 140–400)
RBC: 4.19 MIL/uL (ref 3.80–5.10)
RDW: 14.5 % (ref 11.0–15.0)
WBC: 4.1 10*3/uL (ref 3.8–10.8)

## 2016-07-31 LAB — COMPLETE METABOLIC PANEL WITH GFR
ALBUMIN: 3.6 g/dL (ref 3.6–5.1)
ALK PHOS: 72 U/L (ref 33–130)
ALT: 13 U/L (ref 6–29)
AST: 17 U/L (ref 10–35)
BILIRUBIN TOTAL: 0.4 mg/dL (ref 0.2–1.2)
BUN: 18 mg/dL (ref 7–25)
CALCIUM: 9.5 mg/dL (ref 8.6–10.4)
CO2: 25 mmol/L (ref 20–31)
CREATININE: 0.95 mg/dL — AB (ref 0.60–0.93)
Chloride: 107 mmol/L (ref 98–110)
GFR, EST AFRICAN AMERICAN: 69 mL/min (ref >=60)
GFR, EST NON AFRICAN AMERICAN: 60 mL/min (ref >=60)
Glucose, Bld: 135 mg/dL — ABNORMAL HIGH (ref 65–99)
Potassium: 4.4 mmol/L (ref 3.5–5.3)
Sodium: 143 mmol/L (ref 135–146)
TOTAL PROTEIN: 6.5 g/dL (ref 6.1–8.1)

## 2016-07-31 NOTE — Progress Notes (Signed)
Labs are stable.

## 2016-08-01 ENCOUNTER — Other Ambulatory Visit: Payer: Self-pay | Admitting: Rheumatology

## 2016-08-01 ENCOUNTER — Other Ambulatory Visit: Payer: Self-pay | Admitting: Physician Assistant

## 2016-08-06 ENCOUNTER — Other Ambulatory Visit: Payer: Self-pay | Admitting: Rheumatology

## 2016-08-06 NOTE — Telephone Encounter (Signed)
Last Visit: 05/25/16 Next Visit: 08/25/16 Labs: 07/30/16 Stable  Okay to refill Arava?

## 2016-08-19 ENCOUNTER — Other Ambulatory Visit: Payer: Self-pay | Admitting: Rheumatology

## 2016-08-19 NOTE — Telephone Encounter (Signed)
Last Visit: 05/25/16 Next Visit: 08/25/16 Labs: 07/30/16 Stable Tb Skin Test: 06/04/16 Neg  Okay to refill Enbrel?

## 2016-08-20 DIAGNOSIS — Z79899 Other long term (current) drug therapy: Secondary | ICD-10-CM | POA: Insufficient documentation

## 2016-08-20 DIAGNOSIS — M19042 Primary osteoarthritis, left hand: Secondary | ICD-10-CM

## 2016-08-20 DIAGNOSIS — Z8679 Personal history of other diseases of the circulatory system: Secondary | ICD-10-CM | POA: Insufficient documentation

## 2016-08-20 DIAGNOSIS — Z8639 Personal history of other endocrine, nutritional and metabolic disease: Secondary | ICD-10-CM | POA: Insufficient documentation

## 2016-08-20 DIAGNOSIS — M19041 Primary osteoarthritis, right hand: Secondary | ICD-10-CM | POA: Insufficient documentation

## 2016-08-20 HISTORY — DX: Personal history of other diseases of the circulatory system: Z86.79

## 2016-08-20 NOTE — Progress Notes (Deleted)
Office Visit Note  Patient: [name]             Date of Birth: [birth year]           MRN: [id number]             PCP: Karis Juba, PA-C Referring: Orlena Sheldon, PA-C Visit Date: 08/25/2016 Occupation: '@GUAROCC' @    Subjective:  No chief complaint on file.   History of Present Illness: [name] is a 70 y.o. female ***   Activities of Daily Living:  Patient reports morning stiffness for *** {minute/hour:19697}.   Patient {ACTIONS;DENIES/REPORTS:21021675::"Denies"} nocturnal pain.  Difficulty dressing/grooming: {ACTIONS;DENIES/REPORTS:21021675::"Denies"} Difficulty climbing stairs: {ACTIONS;DENIES/REPORTS:21021675::"Denies"} Difficulty getting out of chair: {ACTIONS;DENIES/REPORTS:21021675::"Denies"} Difficulty using hands for taps, buttons, cutlery, and/or writing: {ACTIONS;DENIES/REPORTS:21021675::"Denies"}   No Rheumatology ROS completed.   PMFS History:  Patient Active Problem List   Diagnosis Date Noted  . History of hypertension 08/20/2016  . History of diabetes mellitus 08/20/2016  . High risk medication use 08/20/2016  . Primary osteoarthritis of both hands 08/20/2016  . Hypomagnesemia 07/21/2016  . Left thyroid nodule 03/05/2016  . Diabetic eye exam ([place]) 10/15/2014  . Multiple pulmonary nodules 04/04/2014  . Malnutrition of moderate degree ([place]) 03/18/2014  . Sepsis ([place]) 03/16/2014  . Anemia 11/21/2013  . 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 Bursitis of right shoulder   . Diabetes type 2, controlled ([place]) 05/04/2013  . GERD (gastroesophageal reflux disease)   . Obesity 12/27/2012  . Rheumatoid arthritis ([place])   . Essential hypertension, benign 10/14/2012  . Hyperlipidemia   . Hormone replacement therapy (postmenopausal)   . Fatty liver disease, nonalcoholic   . Osteoarthritis of right knee 08/05/2012    Past Medical History:  Diagnosis Date  . Arthritis    osteoarthritis  . Bursitis of right shoulder   . Chronic cholecystitis with calculus  05/04/2013  . 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 Diabetes type 2, controlled ([place]) 05/04/2013  . Fatty liver disease, nonalcoholic 4098   hospitalized, MRCP dx fatty liver  . GERD (gastroesophageal reflux disease)   . Hormone replacement therapy (postmenopausal)   . Hyperlipidemia   . Hypertension   . Normal cardiac stress test    pt can't remember when or where  . Obesity, unspecified 12/27/2012  . Rheumatoid arthritis(714.0) 09/28/2012  . 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 Unspecified essential hypertension 05/04/2013    Family History  Problem Relation Age of Onset  . Cancer Mother     breast  . Heart attack Mother   . Stroke Father   . Diabetes Other   . Heart disease Other    Past Surgical History:  Procedure Laterality Date  . ABDOMINAL HYSTERECTOMY    . CHOLECYSTECTOMY N/A 05/04/2013   Procedure: LAPAROSCOPIC CHOLECYSTECTOMY WITH INTRAOPERATIVE CHOLANGIOGRAM;  Surgeon: Haywood Lasso, MD;  Location: [facility];  Service: General;  Laterality: N/A;  . ERCP N/A 05/02/2013   Procedure: ENDOSCOPIC RETROGRADE CHOLANGIOPANCREATOGRAPHY (ERCP);  Surgeon: Beryle Beams, MD;  Location: [facility];  Service: Endoscopy;  Laterality: N/A;  . JOINT REPLACEMENT     left knee  . NASAL SINUS SURGERY    . SPHINCTEROTOMY  05/02/2013   Procedure: SPHINCTEROTOMY;  Surgeon: Beryle Beams, MD;  Location: [facility];  Service: Endoscopy;;  . TONSILLECTOMY    . TOTAL KNEE ARTHROPLASTY  08/01/2012   Procedure: TOTAL KNEE ARTHROPLASTY;  Surgeon: Meredith Pel, MD;  Location: [facility];  Service: Orthopedics;  Laterality: Right;  Right total knee arthroplasty  . TOTAL KNEE ARTHROPLASTY  08/01/2012   RIGHT  KNEE   Social History   Social History Narrative  . No narrative on file     Objective: Vital Signs: There were no vitals taken for this visit.   Physical Exam   Musculoskeletal Exam: ***  CDAI Exam: No CDAI exam completed.      Investigation: Findings:  Her labs from February 2014:  CBC was normal.  Comprehensive metabolic panel showed slight elevation of calcium at 10.8, sed rate was 55, CCP was positive, rheumatoid factor positive.  Her ANA was negative.  Urine was unremarkable.  Vitamin D was within normal limits at 61.  Hep panel, TB Gold was negative and SPEP was negative.  05/2016 TB gold indeterminate patient sent to primary care for skin test  05/25/2016   X-rays of bilateral hands, 2 views, show DIP/PIP joint space narrowing.  Bilateral CMC narrowing, bilateral intercarpal joint space narrowing.  No erosions.  Bilateral feet, 2 views, show DIP/PIP joint space narrowing bilaterally.  No erosions.  Bilateral 1st MCP narrowing.   05/25/2016   RAPID 3 shows a raw score of 16.3 with an index of 5.3, consistent with high severity.   Orders Only on 07/30/2016  Component Date Value Ref Range Status  . WBC 07/30/2016 4.1  3.8 - 10.8 K/uL Final  . RBC 07/30/2016 4.19  3.80 - 5.10 MIL/uL Final  . Hemoglobin 07/30/2016 12.6  11.7 - 15.5 g/dL Final  . HCT 07/30/2016 39.4  35.0 - 45.0 % Final  . MCV 07/30/2016 94.0  80.0 - 100.0 fL Final  . MCH 07/30/2016 30.1  27.0 - 33.0 pg Final  . MCHC 07/30/2016 32.0  32.0 - 36.0 g/dL Final  . RDW 07/30/2016 14.5  11.0 - 15.0 % Final  . Platelets 07/30/2016 193  140 - 400 K/uL Final  . MPV 07/30/2016 11.6  7.5 - 12.5 fL Final  . Neutro Abs 07/30/2016 2132  1,500 - 7,800 cells/uL Final  . Lymphs Abs 07/30/2016 984  850 - 3,900 cells/uL Final  . Monocytes Absolute 07/30/2016 615  200 - 950 cells/uL Final  . Eosinophils Absolute 07/30/2016 369  15 - 500 cells/uL Final  . Basophils Absolute 07/30/2016 0  0 - 200 cells/uL Final  . Neutrophils Relative % 07/30/2016 52  % Final  . Lymphocytes Relative 07/30/2016 24  % Final  . Monocytes Relative 07/30/2016 15  % Final  . Eosinophils Relative 07/30/2016 9  % Final  . Basophils Relative 07/30/2016 0  % Final  . Smear Review  07/30/2016 Criteria for review not met   Final  . Sodium 07/31/2016 143  135 - 146 mmol/L Final  . Potassium 07/31/2016 4.4  3.5 - 5.3 mmol/L Final  . Chloride 07/31/2016 107  98 - 110 mmol/L Final  . CO2 07/31/2016 25  20 - 31 mmol/L Final  . Glucose, Bld 07/31/2016 135* 65 - 99 mg/dL Final  . BUN 07/31/2016 18  7 - 25 mg/dL Final  . Creat 07/31/2016 0.95* 0.60 - 0.93 mg/dL Final   Comment:   For patients > or = 72 years of age: The upper reference limit for Creatinine is approximately 13% higher for people identified as African-American.     . Total Bilirubin 07/31/2016 0.4  0.2 - 1.2 mg/dL Final  . Alkaline Phosphatase 07/31/2016 72  33 - 130 U/L Final  . AST 07/31/2016 17  10 - 35 U/L Final  .  ALT 07/31/2016 13  6 - 29 U/L Final  . Total Protein 07/31/2016 6.5  6.1 - 8.1 g/dL Final  . Albumin 07/31/2016 3.6  3.6 - 5.1 g/dL Final  . Calcium 07/31/2016 9.5  8.6 - 10.4 mg/dL Final  . GFR, Est African American 07/31/2016 69  >=60 mL/min Final  . GFR, Est Non African American 07/31/2016 60  >=60 mL/min Final  Office Visit on 07/19/2016  Component Date Value Ref Range Status  . Magnesium 07/19/2016 1.3* 1.5 - 2.5 mg/dL Final  . Hgb A1c MFr Bld 07/19/2016 6.3* <5.7 % Final   Comment:   For someone without known diabetes, a hemoglobin A1c value between 5.7% and 6.4% is consistent with prediabetes and should be confirmed with a follow-up test.   For someone with known diabetes, a value <7% indicates that their diabetes is well controlled. A1c targets should be individualized based on duration of diabetes, age, co-morbid conditions and other considerations.   This assay result is consistent with an increased risk of diabetes.   Currently, no consensus exists regarding use of hemoglobin A1c for diagnosis of diabetes in children.     . Mean Plasma Glucose 07/19/2016 134  mg/dL Final  . Cholesterol 07/19/2016 156  <200 mg/dL Final   Comment: ** Please note change in reference  range(s). **     . Triglycerides 07/19/2016 114  <150 mg/dL Final   Comment: ** Please note change in reference range(s). **     . HDL 07/19/2016 70  >50 mg/dL Final   Comment: ** Please note change in reference range(s). **     . Total CHOL/HDL Ratio 07/19/2016 2.2  <5.0 Ratio Final  . VLDL 07/19/2016 23  <30 mg/dL Final  . LDL Cholesterol 07/19/2016 63  <100 mg/dL Final   Comment: ** Please note change in reference range(s). **     . Sodium 07/19/2016 143  135 - 146 mmol/L Final  . Potassium 07/19/2016 3.7  3.5 - 5.3 mmol/L Final  . Chloride 07/19/2016 101  98 - 110 mmol/L Final  . CO2 07/19/2016 30  20 - 31 mmol/L Final  . Glucose, Bld 07/19/2016 146* 70 - 99 mg/dL Final  . BUN 07/19/2016 14  7 - 25 mg/dL Final  . Creat 07/19/2016 0.97* 0.60 - 0.93 mg/dL Final   Comment:   For patients > or = 72 years of age: The upper reference limit for Creatinine is approximately 13% higher for people identified as African-American.     . Total Bilirubin 07/19/2016 0.6  0.2 - 1.2 mg/dL Final  . Alkaline Phosphatase 07/19/2016 113  33 - 130 U/L Final  . AST 07/19/2016 24  10 - 35 U/L Final  . ALT 07/19/2016 16  6 - 29 U/L Final  . Total Protein 07/19/2016 6.5  6.1 - 8.1 g/dL Final  . Albumin 07/19/2016 3.4* 3.6 - 5.1 g/dL Final  . Calcium 07/19/2016 8.9  8.6 - 10.4 mg/dL Final  . GFR, Est African American 07/19/2016 67  >=60 mL/min Final  . GFR, Est Non African American 07/19/2016 59* >=60 mL/min Final     Imaging: No results found.  Speciality Comments: No specialty comments available.    Procedures:  No procedures performed Allergies: Monopril [fosinopril] and Norvasc [amlodipine besylate]   Assessment / Plan:     Visit Diagnoses: Rheumatoid arthritis involving multiple sites with positive rheumatoid factor (HCC)  Primary osteoarthritis of right knee  Fatty liver disease, nonalcoholic  History of hypertension  History  of diabetes mellitus  High risk medication  use  Primary osteoarthritis of both hands    Orders: No orders of the defined types were placed in this encounter.  No orders of the defined types were placed in this encounter.   Face-to-face time spent with patient was *** minutes. 50% of time was spent in counseling and coordination of care.  Follow-Up Instructions: No Follow-up on file.   Amnah Breuer, RT

## 2016-08-25 ENCOUNTER — Ambulatory Visit: Payer: BLUE CROSS/BLUE SHIELD | Admitting: Rheumatology

## 2016-08-26 ENCOUNTER — Encounter: Payer: Self-pay | Admitting: Physician Assistant

## 2016-08-26 ENCOUNTER — Ambulatory Visit (INDEPENDENT_AMBULATORY_CARE_PROVIDER_SITE_OTHER): Payer: BLUE CROSS/BLUE SHIELD | Admitting: Physician Assistant

## 2016-08-26 VITALS — BP 122/80 | HR 127 | Temp 97.9°F | Resp 16 | Wt 176.0 lb

## 2016-08-26 DIAGNOSIS — J209 Acute bronchitis, unspecified: Secondary | ICD-10-CM | POA: Diagnosis not present

## 2016-08-26 DIAGNOSIS — J988 Other specified respiratory disorders: Secondary | ICD-10-CM

## 2016-08-26 DIAGNOSIS — B9689 Other specified bacterial agents as the cause of diseases classified elsewhere: Secondary | ICD-10-CM

## 2016-08-26 MED ORDER — PREDNISONE 20 MG PO TABS: 20.0000 mg | ORAL_TABLET | Freq: Every day | ORAL | 0 refills | Status: DC

## 2016-08-26 MED ORDER — LEVOFLOXACIN 750 MG PO TABS: 750.0000 mg | ORAL_TABLET | Freq: Every day | ORAL | 0 refills | Status: DC

## 2016-08-26 MED ORDER — HYDROCODONE-HOMATROPINE 5-1.5 MG/5ML PO SYRP: 5.0000 mL | ORAL_SOLUTION | Freq: Three times a day (TID) | ORAL | 0 refills | Status: DC | PRN

## 2016-08-26 NOTE — Progress Notes (Signed)
Patient ID: [name] MRN: [id number], DOB: 1945/11/06, Katherine y.o. Date of Encounter: 08/26/2016, 11:54 AM    Chief Complaint:  Chief Complaint  Patient presents with  . not able to eat    x 1 wk  . Cough    x 1 wk  . Fatigue    not drinking lots of fluids  . Shortness of Breath    x1wk     HPI: Katherine y.o. year old female presents with above.  Says that "everybody in the house has had the flu". Throughout the visit [name] has a very bad very congested cough. Symptoms as above. No additional concerns to address today.     Home Meds:   Outpatient Medications Prior to Visit  Medication Sig Dispense Refill  . amLODipine (NORVASC) 5 MG tablet TAKE 1 TABLET DAILY 90 tablet 1  . aspirin EC 81 MG tablet Take 81 mg by mouth daily.    Marland Kitchen atorvastatin (LIPITOR) 10 MG tablet TAKE 1 TABLET DAILY (NEW DOSE) 90 tablet 2  . Calcium Carbonate-Vitamin D (CALCIUM 600 + D PO) Take 1 tablet by mouth 2 (two) times daily.    . cloNIDine (CATAPRES) 0.3 MG tablet TAKE 1 TABLET TWICE A DAY 180 tablet 1  . cyanocobalamin 500 MCG tablet Take 1 tablet (500 mcg total) by mouth daily. 30 tablet 0  . diazepam (VALIUM) 10 MG tablet Take 1 tablet (10 mg total) by mouth every 8 (eight) hours as needed (muscle spasms). 30 tablet 1  . ENBREL SURECLICK 50 MG/ML injection INJECT 50 MG (0.98 ML) UNDER THE SKIN ONCE A WEEK 3.92 mL 2  . estradiol (ESTRACE) 0.5 MG tablet Take 0.5 mg by mouth 2 (two) times daily.    . fish oil-omega-3 fatty acids 1000 MG capsule Take 1 g by mouth at bedtime.     . folic acid (FOLVITE) 1 MG tablet Take 1 tablet by mouth 2 (two) times daily.     Marland Kitchen leflunomide (ARAVA) 10 MG tablet TAKE 1 TABLET DAILY 90 tablet 0  . losartan-hydrochlorothiazide (HYZAAR) 100-Katherine.5 MG tablet TAKE 1 TABLET DAILY 90 tablet 1  . magnesium chloride (SLOW-MAG) 64 MG TBEC SR tablet Take 2 tablets (128 mg total) by mouth daily. 60 tablet 5  . metoprolol tartrate (LOPRESSOR) 25 MG tablet TAKE ONE-HALF (1/2) TABLET TWICE  A DAY 90 tablet 0  . Multiple Vitamins-Minerals (MULTIVITAMINS THER. W/MINERALS) TABS Take 1 tablet by mouth daily.    Marland Kitchen omeprazole (PRILOSEC) 20 MG capsule TAKE 1 CAPSULE DAILY 90 capsule 3  . HYDROcodone-homatropine (HYCODAN) 5-1.5 MG/5ML syrup Take 5 mLs by mouth every 8 (eight) hours as needed for cough. 120 mL 0  . azithromycin (ZITHROMAX) 250 MG tablet Day 1: take 2 daily. Days 2-5: Take 1 daily. (Patient not taking: Reported on 08/26/2016) 6 tablet 0  . clindamycin (CLEOCIN) 300 MG capsule Take 1 capsule (300 mg total) by mouth 3 (three) times daily. (Patient not taking: Reported on 08/26/2016) 30 capsule 0  . diclofenac (VOLTAREN) 75 MG EC tablet Take 1 tablet (75 mg total) by mouth 2 (two) times daily. (Patient not taking: Reported on 08/26/2016) 30 tablet 0  . diclofenac sodium (VOLTAREN) 1 % GEL Apply 3 g topically daily as needed (pain). To 3 large joints    . hydroxychloroquine (PLAQUENIL) 200 MG tablet Take 200 mg by mouth daily.  1   No facility-administered medications prior to visit.     Allergies:  Allergies  Allergen Reactions  . Monopril [  Fosinopril] Cough  . Norvasc [Amlodipine Besylate] Other (See Comments)    10 mg dose causes LE edema.  Tolerates 5mg  dose.      Review of Systems: See HPI for pertinent ROS. All other ROS negative.    Physical Exam: Blood pressure 122/80, 102 pulse , temperature 97.9 F (36.6 C), temperature source Oral, resp. rate 16, weight 176 lb (79.8 kg), SpO2 96 %., Body mass index is 34.37 kg/m. General:  WNWD WF Appears in no acute distress. HEENT: Normocephalic, atraumatic, eyes without discharge, sclera non-icteric, nares are without discharge. Bilateral auditory canals clear, TM's are without perforation, pearly grey and translucent with reflective cone of light bilaterally. Oral cavity moist, posterior pharynx without exudate, erythema, peritonsillar abscess.  Neck: Supple. No thyromegaly. No lymphadenopathy. Lungs: Every time [name] takes a  deep breath this causes her to cough. Breath sounds are somewhat distant/decreased.Slight wheeze scattered throughout bilaterally. Heart: Regular rhythm. No murmurs, rubs, or gallops. Msk:  Strength and tone normal for age. Extremities/Skin: Warm and dry.  Neuro: Alert and oriented X 3. Moves all extremities spontaneously. Gait is normal. CNII-XII grossly in tact. Psych:  Responds to questions appropriately with a normal affect.     ASSESSMENT AND PLAN:  Katherine y.o. year old female with  1. Acute bronchitis, unspecified organism Reviewed her chart. [name] was just prescribed azithromycin 07/19/16. Therefore will not prescribe this again now in such close proximity. He is to take the Levaquin and the prednisone as directed. Can use the Hycodan as cough suppressant. [name] is to follow-up if symptoms worsen or if they are not resolved upon completion of antibiotic and prednisone. Suspect that [name] may have started out with influenza and now developed a secondary bacterial infection. - levofloxacin (LEVAQUIN) 750 MG tablet; Take 1 tablet (750 mg total) by mouth daily.  Dispense: 7 tablet; Refill: 0 - predniSONE (DELTASONE) 20 MG tablet; Take 1 tablet (20 mg total) by mouth daily with breakfast.  Dispense: 5 tablet; Refill: 0 - HYDROcodone-homatropine (HYCODAN) 5-1.5 MG/5ML syrup; Take 5 mLs by mouth every 8 (eight) hours as needed for cough.  Dispense: 120 mL; Refill: 0  2. Bacterial respiratory infection - HYDROcodone-homatropine (HYCODAN) 5-1.5 MG/5ML syrup; Take 5 mLs by mouth every 8 (eight) hours as needed for cough.  Dispense: 120 mL; Refill: 0   Signed, [address] [place], [place], [facility] 08/26/2016 11:54 AM

## 2016-09-03 ENCOUNTER — Other Ambulatory Visit: Payer: Self-pay | Admitting: Physician Assistant

## 2016-09-03 NOTE — Telephone Encounter (Signed)
RX filled per protocol 

## 2016-09-10 ENCOUNTER — Other Ambulatory Visit: Payer: Self-pay | Admitting: Family Medicine

## 2016-09-10 ENCOUNTER — Other Ambulatory Visit: Payer: Self-pay | Admitting: Physician Assistant

## 2016-09-13 NOTE — Telephone Encounter (Signed)
Rx filled per protocol  

## 2016-09-21 ENCOUNTER — Encounter: Payer: Self-pay | Admitting: Rheumatology

## 2016-09-21 ENCOUNTER — Ambulatory Visit (INDEPENDENT_AMBULATORY_CARE_PROVIDER_SITE_OTHER): Payer: BLUE CROSS/BLUE SHIELD | Admitting: Rheumatology

## 2016-09-21 VITALS — BP 146/71 | HR 74 | Resp 15 | Ht 59.0 in | Wt 192.0 lb

## 2016-09-21 DIAGNOSIS — M79641 Pain in right hand: Secondary | ICD-10-CM

## 2016-09-21 DIAGNOSIS — M0579 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 without organ or systems involvement: Secondary | ICD-10-CM

## 2016-09-21 DIAGNOSIS — Z79899 Other long term (current) drug therapy: Secondary | ICD-10-CM | POA: Diagnosis not present

## 2016-09-21 DIAGNOSIS — M8580 Other specified disorders of bone density and structure, unspecified site: Secondary | ICD-10-CM

## 2016-09-21 DIAGNOSIS — M79642 Pain in left hand: Secondary | ICD-10-CM

## 2016-09-21 NOTE — Progress Notes (Signed)
Rheumatology Medication Review by a Pharmacist Does the patient feel that his/her medications are working for him/her?  Yes Has the patient been experiencing any side effects to the medications prescribed?  No Does the patient have any problems obtaining medications?  No  Issues to address at subsequent visits: None   Pharmacist comments:  [name] is a pleasant 71 yo F who presents for follow up of rheumatoid arthritis.  She is currently taking Arava 10 mg daily and Enbrel 50 mg weekly.  She stopped taking hydroxychloroquine when she started the Enbrel.  She reports she had the flu/respiratory infection earlier in January.  She reports she was on antibiotics and has completed those now.  She states she did not hold her Enbrel and Arava while sick.  I reviewed with patient that these medications should be held while she is sick and resumed after completing antibiotics and once she feels back to normal.  Patient voiced understanding.    Patient had standing labs on 07/30/16 at which time CBC was normal and CMP showed glucose 135, creatinine 0.95.  She had most recent TB test (PPD) on 06/04/16 which was negative.  She will be due for repeat TB test in October 2018.    Patient reports she will be switching to new insurance in February.  Patient does not have the new card with her.  She is concerned about coverage of Enbrel with her new insurance.  I have advised her to call me once she gets her new insurance card and I can completed PA/help in determining copay of Enbrel.  Patient voiced understanding.  Patient denies any other questions or concerns regarding her medications at this time.     Elisabeth Most, Pharm.D., BCPS, CPP Clinical Pharmacist Pager: [phone] Phone: [phone] 09/21/2016 10:58 AM

## 2016-09-21 NOTE — Progress Notes (Signed)
Office Visit Note  Patient: [name]             Date of Birth: 01-26-1946           MRN: [id number]             PCP: Karis Juba, PA-C Referring: Orlena Sheldon, PA-C Visit Date: 09/21/2016 Occupation: '@GUAROCC' @    Subjective:  Follow-up Follow-up on RA and high risk prescription  History of Present Illness: [name] is a 71 y.o. female   Last seen on 05/25/2016. She had failure of her medications. Please see October 2017 note for full details. As a result we offered her Enbrel. She's been taking Enbrel now since 06/25/2016. She is also taking Arava 10 mg every day. She was on Plaquenil previously and that was stopped when she started the Enbrel. Patient reports that she is doing very well with this new therapy. She does not have any joint pain swelling and stiffness.  Her TB goLD was indeterminant in October 2017 and we did a PPD through her PCPs office, Doreen Beam PA-C. It was negative October 2017.  Patient has a stop working and as a result she will lose her insurance. She has side of her new insurance. The new insurance will be active soon. She is waiting on to receive her card. Once we get the card we will reapply for Enbrel under the new insurance policy. I'm hopeful that it will be well covered so she does not have interruption in her care.  Her last labs were done 07/30/2016 and will be due again in about 3 months. She stable on her labs overall.   Activities of Daily Living:  Patient reports morning stiffness for 15 minutes.   Patient Denies nocturnal pain.  Difficulty dressing/grooming: Denies Difficulty climbing stairs: Denies Difficulty getting out of chair: Denies Difficulty using hands for taps, buttons, cutlery, and/or writing: Denies   Review of Systems  Constitutional: Negative for fatigue.  HENT: Negative for mouth sores and mouth dryness.   Eyes: Negative for dryness.  Respiratory: Negative for shortness of breath.     Gastrointestinal: Negative for constipation and diarrhea.  Musculoskeletal: Negative for myalgias and myalgias.  Skin: Negative for sensitivity to sunlight.  Psychiatric/Behavioral: Negative for decreased concentration and sleep disturbance.    PMFS History:  Patient Active Problem List   Diagnosis Date Noted  . History of hypertension 08/20/2016  . History of diabetes mellitus 08/20/2016  . High risk medication use 08/20/2016  . Primary osteoarthritis of both hands 08/20/2016  . Hypomagnesemia 07/21/2016  . Left thyroid nodule 03/05/2016  . Diabetic eye exam ([place]) 10/15/2014  . Multiple pulmonary nodules 04/04/2014  . Malnutrition of moderate degree ([place]) 03/18/2014  . Sepsis ([place]) 03/16/2014  . Anemia 11/21/2013  . 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 Bursitis of right shoulder   . Diabetes type 2, controlled ([place]) 05/04/2013  . GERD (gastroesophageal reflux disease)   . Obesity 12/27/2012  . Rheumatoid arthritis ([place])   . Essential hypertension, benign 10/14/2012  . Hyperlipidemia   . Hormone replacement therapy (postmenopausal)   . Fatty liver disease, nonalcoholic   . Osteoarthritis of right knee 08/05/2012    Past Medical History:  Diagnosis Date  . Arthritis    osteoarthritis  . Bursitis of right shoulder   . Chronic cholecystitis with calculus 05/04/2013  . 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 Diabetes type 2, controlled ([place]) 05/04/2013  . Fatty  liver disease, nonalcoholic 1700   hospitalized, MRCP dx fatty liver  . GERD (gastroesophageal reflux disease)   . Hormone replacement therapy (postmenopausal)   . Hyperlipidemia   . Hypertension   . Normal cardiac stress test    pt can't remember when or where  . Obesity, unspecified 12/27/2012  . Rheumatoid arthritis(714.0) 09/28/2012  . 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 Unspecified essential hypertension 05/04/2013    Family History  Problem Relation Age of Onset  .  Cancer Mother     breast  . Heart attack Mother   . Stroke Father   . Diabetes Other   . Heart disease Other   . Diabetes Sister    Past Surgical History:  Procedure Laterality Date  . ABDOMINAL HYSTERECTOMY    . CHOLECYSTECTOMY N/A 05/04/2013   Procedure: LAPAROSCOPIC CHOLECYSTECTOMY WITH INTRAOPERATIVE CHOLANGIOGRAM;  Surgeon: Haywood Lasso, MD;  Location: [facility];  Service: General;  Laterality: N/A;  . ERCP N/A 05/02/2013   Procedure: ENDOSCOPIC RETROGRADE CHOLANGIOPANCREATOGRAPHY (ERCP);  Surgeon: Beryle Beams, MD;  Location: [facility];  Service: Endoscopy;  Laterality: N/A;  . JOINT REPLACEMENT     left knee  . NASAL SINUS SURGERY    . SPHINCTEROTOMY  05/02/2013   Procedure: SPHINCTEROTOMY;  Surgeon: Beryle Beams, MD;  Location: [facility];  Service: Endoscopy;;  . TONSILLECTOMY    . TOTAL KNEE ARTHROPLASTY  08/01/2012   Procedure: TOTAL KNEE ARTHROPLASTY;  Surgeon: Meredith Pel, MD;  Location: [facility];  Service: Orthopedics;  Laterality: Right;  Right total knee arthroplasty  . TOTAL KNEE ARTHROPLASTY  08/01/2012   RIGHT  KNEE   Social History   Social History Narrative  . No narrative on file     Objective: Vital Signs: BP (!) 146/71 (BP Location: Left Arm, Patient Position: Sitting, Cuff Size: Normal)   Pulse 74   Resp 15   Ht '4\' 11"'  (1.499 m)   Wt 192 lb (87.1 kg)   BMI 38.78 kg/m    Physical Exam  Constitutional: She is oriented to person, place, and time. She appears well-developed and well-nourished.  HENT:  Head: Normocephalic and atraumatic.  Eyes: EOM are normal. Pupils are equal, round, and reactive to light.  Cardiovascular: Normal rate, regular rhythm and normal heart sounds.  Exam reveals no gallop and no friction rub.   No murmur heard. Pulmonary/Chest: Effort normal and breath sounds normal. She has no wheezes. She has no rales.  Abdominal: Soft. Bowel sounds are normal. She exhibits no distension. There is no tenderness. There is  no guarding. No hernia.  Musculoskeletal: Normal range of motion. She exhibits no edema, tenderness or deformity.  Lymphadenopathy:    She has no cervical adenopathy.  Neurological: She is alert and oriented to person, place, and time. Coordination normal.  Skin: Skin is warm and dry. Capillary refill takes less than 2 seconds. No rash noted.  Psychiatric: She has a normal mood and affect. Her behavior is normal.  Nursing note and vitals reviewed.    Musculoskeletal Exam:  Full range of motion of all joints Grip strength is equal and strong bilaterally Fiber myalgia tender points are all absent  CDAI Exam: CDAI Homunculus Exam:   Joint Counts:  CDAI Tender Joint count: 0 CDAI Swollen Joint count: 0  Global Assessments:  Patient Global Assessment: 1 Provider Global Assessment: 1  CDAI Calculated Score: 2    Investigation: No additional findings.  Orders Only on 07/30/2016  Component Date Value Ref Range  Status  . WBC 07/30/2016 4.1  3.8 - 10.8 K/uL Final  . RBC 07/30/2016 4.19  3.80 - 5.10 MIL/uL Final  . Hemoglobin 07/30/2016 12.6  11.7 - 15.5 g/dL Final  . HCT 07/30/2016 39.4  35.0 - 45.0 % Final  . MCV 07/30/2016 94.0  80.0 - 100.0 fL Final  . MCH 07/30/2016 30.1  27.0 - 33.0 pg Final  . MCHC 07/30/2016 32.0  32.0 - 36.0 g/dL Final  . RDW 07/30/2016 14.5  11.0 - 15.0 % Final  . Platelets 07/30/2016 193  140 - 400 K/uL Final  . MPV 07/30/2016 11.6  7.5 - 12.5 fL Final  . Neutro Abs 07/30/2016 2132  1,500 - 7,800 cells/uL Final  . Lymphs Abs 07/30/2016 984  850 - 3,900 cells/uL Final  . Monocytes Absolute 07/30/2016 615  200 - 950 cells/uL Final  . Eosinophils Absolute 07/30/2016 369  15 - 500 cells/uL Final  . Basophils Absolute 07/30/2016 0  0 - 200 cells/uL Final  . Neutrophils Relative % 07/30/2016 52  % Final  . Lymphocytes Relative 07/30/2016 24  % Final  . Monocytes Relative 07/30/2016 15  % Final  . Eosinophils Relative 07/30/2016 9  % Final  . Basophils  Relative 07/30/2016 0  % Final  . Smear Review 07/30/2016 Criteria for review not met   Final  . Sodium 07/30/2016 143  135 - 146 mmol/L Final  . Potassium 07/30/2016 4.4  3.5 - 5.3 mmol/L Final  . Chloride 07/30/2016 107  98 - 110 mmol/L Final  . CO2 07/30/2016 25  20 - 31 mmol/L Final  . Glucose, Bld 07/30/2016 135* 65 - 99 mg/dL Final  . BUN 07/30/2016 18  7 - 25 mg/dL Final  . Creat 07/30/2016 0.95* 0.60 - 0.93 mg/dL Final   Comment:   For patients > or = 73 years of age: The upper reference limit for Creatinine is approximately 13% higher for people identified as African-American.     . Total Bilirubin 07/30/2016 0.4  0.2 - 1.2 mg/dL Final  . Alkaline Phosphatase 07/30/2016 72  33 - 130 U/L Final  . AST 07/30/2016 17  10 - 35 U/L Final  . ALT 07/30/2016 13  6 - 29 U/L Final  . Total Protein 07/30/2016 6.5  6.1 - 8.1 g/dL Final  . Albumin 07/30/2016 3.6  3.6 - 5.1 g/dL Final  . Calcium 07/30/2016 9.5  8.6 - 10.4 mg/dL Final  . GFR, Est African American 07/30/2016 69  >=60 mL/min Final  . GFR, Est Non African American 07/30/2016 60  >=60 mL/min Final  Office Visit on 07/19/2016  Component Date Value Ref Range Status  . Magnesium 07/19/2016 1.3* 1.5 - 2.5 mg/dL Final  . Hgb A1c MFr Bld 07/19/2016 6.3* <5.7 % Final   Comment:   For someone without known diabetes, a hemoglobin A1c value between 5.7% and 6.4% is consistent with prediabetes and should be confirmed with a follow-up test.   For someone with known diabetes, a value <7% indicates that their diabetes is well controlled. A1c targets should be individualized based on duration of diabetes, age, co-morbid conditions and other considerations.   This assay result is consistent with an increased risk of diabetes.   Currently, no consensus exists regarding use of hemoglobin A1c for diagnosis of diabetes in children.     . Mean Plasma Glucose 07/19/2016 134  mg/dL Final  . Cholesterol 07/19/2016 156  <200 mg/dL Final    Comment: ** Please note change  in reference range(s). **     . Triglycerides 07/19/2016 114  <150 mg/dL Final   Comment: ** Please note change in reference range(s). **     . HDL 07/19/2016 70  >50 mg/dL Final   Comment: ** Please note change in reference range(s). **     . Total CHOL/HDL Ratio 07/19/2016 2.2  <5.0 Ratio Final  . VLDL 07/19/2016 23  <30 mg/dL Final  . LDL Cholesterol 07/19/2016 63  <100 mg/dL Final   Comment: ** Please note change in reference range(s). **     . Sodium 07/19/2016 143  135 - 146 mmol/L Final  . Potassium 07/19/2016 3.7  3.5 - 5.3 mmol/L Final  . Chloride 07/19/2016 101  98 - 110 mmol/L Final  . CO2 07/19/2016 30  20 - 31 mmol/L Final  . Glucose, Bld 07/19/2016 146* 70 - 99 mg/dL Final  . BUN 07/19/2016 14  7 - 25 mg/dL Final  . Creat 07/19/2016 0.97* 0.60 - 0.93 mg/dL Final   Comment:   For patients > or = 73 years of age: The upper reference limit for Creatinine is approximately 13% higher for people identified as African-American.     . Total Bilirubin 07/19/2016 0.6  0.2 - 1.2 mg/dL Final  . Alkaline Phosphatase 07/19/2016 113  33 - 130 U/L Final  . AST 07/19/2016 24  10 - 35 U/L Final  . ALT 07/19/2016 16  6 - 29 U/L Final  . Total Protein 07/19/2016 6.5  6.1 - 8.1 g/dL Final  . Albumin 07/19/2016 3.4* 3.6 - 5.1 g/dL Final  . Calcium 07/19/2016 8.9  8.6 - 10.4 mg/dL Final  . GFR, Est African American 07/19/2016 67  >=60 mL/min Final  . GFR, Est Non African American 07/19/2016 59* >=60 mL/min Final  Clinical Support on 06/02/2016  Component Date Value Ref Range Status  . TB Skin Test 06/04/2016 Negative   Final  . Induration 06/04/2016 0  mm Final  Hospital Outpatient Visit on 03/30/2016  Component Date Value Ref Range Status  . Glucose-Capillary 03/30/2016 115* 65 - 99 mg/dL Final  Hospital Outpatient Visit on 03/30/2016  Component Date Value Ref Range Status  . aPTT 03/30/2016 23* 24 - 36 seconds Final  . WBC 03/30/2016 5.4  4.0 -  10.5 K/uL Final  . RBC 03/30/2016 3.91  3.87 - 5.11 MIL/uL Final  . Hemoglobin 03/30/2016 12.2  12.0 - 15.0 g/dL Final  . HCT 03/30/2016 37.6  36.0 - 46.0 % Final  . MCV 03/30/2016 96.2  78.0 - 100.0 fL Final  . MCH 03/30/2016 31.2  26.0 - 34.0 pg Final  . MCHC 03/30/2016 32.4  30.0 - 36.0 g/dL Final  . RDW 03/30/2016 14.5  11.5 - 15.5 % Final  . Platelets 03/30/2016 179  150 - 400 K/uL Final  . Prothrombin Time 03/30/2016 13.6  11.4 - 15.2 seconds Final  . INR 03/30/2016 1.04   Final     Imaging: No results found.  Speciality Comments: No specialty comments available.    Procedures:  No procedures performed Allergies: Monopril [fosinopril] and Norvasc [amlodipine besylate]   Assessment / Plan:     Visit Diagnoses: 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 without organ or systems involvement (HCC) - +CCP  High risk medications (not anticoagulants) long-term use  Bilateral hand pain  Osteopenia, unspecified location - T = -1.8   PT RETIRED IN DEC 2017; NEW INSURANCE POLICY WILL START SOON; WE WILL RE-APPLY W/ NEW CARD  WHEN PT GETS THE CARD. PROBABLY WILL GET CARD BY 1ST WEEK February.  Patient is responding very well to Enbrel every week. Started the injections about 06/25/2016. Taking Arava 10 mg every day. Stop the Plaquenil when she started the Enbrel.  Patient received her pneumonia vaccine (Pneumovax 23) [facility].  PPD is negative (see labs TAB in Epic). This was October 2017. We'll need annual PPD October. READ by Karis Juba PA-C  Patient will get his paperwork/card for new insurance so we can apply for Enbrel. We are hopeful that it will be approved on her new insurance.  Patient had labs done in December 2017 and will be due for labs again in February 2018.  Return to clinic in 4 months  Orders: No orders of the defined types were placed in this encounter.  No orders of the defined types were placed in this  encounter.   Face-to-face time spent with patient was 30 minutes. 50% of time was spent in counseling and coordination of care.  Follow-Up Instructions: No Follow-up on file.   Eliezer Lofts, PA-C  Note - This record has been created using [employer].  Chart creation errors have been sought, but may not always  have been located. Such creation errors do not reflect on  the standard of medical care.

## 2016-10-07 ENCOUNTER — Telehealth: Payer: Self-pay | Admitting: Pharmacist

## 2016-10-07 NOTE — Telephone Encounter (Signed)
Received call from Dora at [place] [facility] requesting patient's diagnosis code for patient's Enbrel prescription.  Provided Dora with diagnosis code of M05.79 per patient's chart.     Elisabeth Most, Pharm.D., BCPS, CPP Clinical Pharmacist Pager: [phone] Phone: [phone] 10/07/2016 1:20 PM

## 2016-10-14 ENCOUNTER — Telehealth (INDEPENDENT_AMBULATORY_CARE_PROVIDER_SITE_OTHER): Payer: Self-pay | Admitting: Rheumatology

## 2016-10-14 NOTE — Telephone Encounter (Signed)
Please call patient regarding her Enbrel.  Cb#[phone].

## 2016-10-14 NOTE — Telephone Encounter (Signed)
Patient states she has been informed that with her new insurance she is going to have pay $1785 for her Enbrel. Patient states she is unable to afford this.

## 2016-10-15 ENCOUNTER — Telehealth: Payer: Self-pay | Admitting: Pharmacist

## 2016-10-15 NOTE — Telephone Encounter (Signed)
I called patient.  Patient's new insurance is [employer] ([employer]  Patient reports she is unable to afford her Enbrel copay.  I discussed the [employer] with patient.  She reports she would be interested in applying.  Patient reports she will come by the office today or Monday to complete her portion of the application.  Patient confirms she just had her Enbrel refilled earlier this week and has enough medication for the next month.  Will submit application once I receive patient's portion of the application.

## 2016-10-15 NOTE — Telephone Encounter (Signed)
Patient has difficulty affording her Enbrel with new insurance.  Assistance patient in enrolling in the patient assistance program.  Will need provider portion with new prescription.    Last visit: 09/21/16 Next visit: 01/20/17 Labs: 07/30/16 CBC normal, CMP with Cr 0.95, GFR 60, glucose 135 PPD negative October 2017 per PCP office  Okay to refill Enbrel?  Patient assistance form is on your desk.  I will inform patient she is due for standing labs in the next two weeks.

## 2016-10-17 NOTE — Telephone Encounter (Signed)
ok 

## 2016-10-18 ENCOUNTER — Telehealth: Payer: Self-pay

## 2016-10-18 NOTE — Telephone Encounter (Signed)
Provider portion completed by Dr. Estanislado Pandy.    Patient came by the office and completed the patient portion.  She did not bring proof of income with her.  Application was submitted on 10/18/16 without proof of income.  Will check on status in 3-4 days to determine if any further information is needed.     Elisabeth Most, Pharm.D., BCPS, CPP Clinical Pharmacist Pager: [phone] Phone: [phone] 10/18/2016 8:33 AM

## 2016-10-18 NOTE — Telephone Encounter (Signed)
Received a fax from [employer] regarding an approval for Enbrel  from 10/18/16 to 08/22/17.   Reference [id number] Phone number: [phone]  Will send document to scan center.  Spoke to patient with an update. The foundation should call her to set up her shipment. She has some medication on hand and will not need a refill at this time. She was given the phone number to call the foundation when she needs a refill.   Takya Vandivier, [place], CPhT  2:41 PM

## 2016-10-20 ENCOUNTER — Other Ambulatory Visit (INDEPENDENT_AMBULATORY_CARE_PROVIDER_SITE_OTHER): Payer: Self-pay | Admitting: Rheumatology

## 2016-10-20 NOTE — Telephone Encounter (Signed)
Patient requesting a prescription for Voltaren Gel. Patient she is having pain in her shoulder. Patient states it really seem to help before. Can we send a prescription for Voltaren gel for her?

## 2016-10-20 NOTE — Telephone Encounter (Signed)
ok 

## 2016-10-20 NOTE — Telephone Encounter (Signed)
Patient is requesting voltaren gel, states she used it before and it worked for her.  Cb#: [phone] [facility] [address]

## 2016-10-21 MED ORDER — DICLOFENAC SODIUM 1 % TD GEL: TRANSDERMAL | 3 refills | Status: DC

## 2016-10-21 NOTE — Telephone Encounter (Signed)
Patient advised prescription sent to the pharmacy.  

## 2016-10-21 NOTE — Addendum Note (Signed)
Addended by: Carole Binning on: 10/21/2016 10:25 AM   Modules accepted: Orders

## 2016-10-22 ENCOUNTER — Other Ambulatory Visit: Payer: Self-pay | Admitting: *Deleted

## 2016-10-22 MED ORDER — DICLOFENAC SODIUM 1 % TD GEL: TRANSDERMAL | 3 refills | Status: DC

## 2016-10-22 NOTE — Telephone Encounter (Signed)
Patient called stating that the pharmacy does not have her prescription, she [facility] [address] [place]  Cb#[phone].  Thank you.

## 2016-10-22 NOTE — Telephone Encounter (Signed)
Prescription resent to the pharmacy.  

## 2016-10-25 ENCOUNTER — Telehealth: Payer: Self-pay

## 2016-10-25 NOTE — Telephone Encounter (Signed)
Received a conformation from [employer] regarding a prior authorization approval for Diclofenac Gel from 09/25/2016 to 10/25/2017.   Reference [id number] [id number] Phone number:  Will send document to scan center once receied.  Spoke with Ms. Rabe to update her. She voiced understanding and denied any other questions.  Dean Goldner, [place], CPhT  8:58 AM

## 2016-11-02 ENCOUNTER — Other Ambulatory Visit: Payer: Self-pay | Admitting: Physician Assistant

## 2016-11-02 NOTE — Telephone Encounter (Signed)
Refill appropriate 

## 2016-11-03 ENCOUNTER — Telehealth: Payer: Self-pay

## 2016-11-03 NOTE — Telephone Encounter (Signed)
Received a fax from [place] stating that the patient's Rx for Voltaren gel was not covered by her health plan. The medication is part of a program called step therapy. This requires a trial of a step-one medication before a more expensive step-two or step-three medication is covered. The following is considered a step one medication:  -meloxicam -nabumetone -ibuprofen -naproxen -etodolac  We submitted a prior authorization to [employer] scripts through cover my meds for the medication on March 5th and received an approval conformation.   [id number] Approved through: 10/25/2017  Left message for a representative to call me back for clarification.   Haig Gerardo, [place], CPhT

## 2016-11-05 ENCOUNTER — Other Ambulatory Visit: Payer: Self-pay | Admitting: Radiology

## 2016-11-05 ENCOUNTER — Other Ambulatory Visit: Payer: Self-pay | Admitting: Rheumatology

## 2016-11-05 DIAGNOSIS — Z79899 Other long term (current) drug therapy: Secondary | ICD-10-CM | POA: Diagnosis not present

## 2016-11-05 LAB — COMPLETE METABOLIC PANEL WITH GFR
ALBUMIN: 3.7 g/dL (ref 3.6–5.1)
ALK PHOS: 73 U/L (ref 33–130)
ALT: 19 U/L (ref 6–29)
AST: 23 U/L (ref 10–35)
BILIRUBIN TOTAL: 0.4 mg/dL (ref 0.2–1.2)
BUN: 21 mg/dL (ref 7–25)
CALCIUM: 9.1 mg/dL (ref 8.6–10.4)
CO2: 25 mmol/L (ref 20–31)
CREATININE: 0.77 mg/dL (ref 0.60–0.93)
Chloride: 105 mmol/L (ref 98–110)
GFR, Est African American: 89 mL/min (ref >=60)
GFR, Est Non African American: 77 mL/min (ref >=60)
GLUCOSE: 143 mg/dL — AB (ref 65–99)
POTASSIUM: 3.9 mmol/L (ref 3.5–5.3)
SODIUM: 140 mmol/L (ref 135–146)
TOTAL PROTEIN: 6.6 g/dL (ref 6.1–8.1)

## 2016-11-05 LAB — CBC WITH DIFFERENTIAL/PLATELET
Basophils Absolute: 0 cells/uL (ref 0–200)
Basophils Relative: 0 %
Eosinophils Absolute: 364 cells/uL (ref 15–500)
Eosinophils Relative: 7 %
HCT: 37.2 % (ref 35.0–45.0)
Hemoglobin: 12.3 g/dL (ref 11.7–15.5)
Lymphocytes Relative: 42 %
Lymphs Abs: 2184 cells/uL (ref 850–3900)
MCH: 31.4 pg (ref 27.0–33.0)
MCHC: 33.1 g/dL (ref 32.0–36.0)
MCV: 94.9 fL (ref 80.0–100.0)
MPV: 12.7 fL — ABNORMAL HIGH (ref 7.5–12.5)
Monocytes Absolute: 728 cells/uL (ref 200–950)
Monocytes Relative: 14 %
Neutro Abs: 1924 cells/uL (ref 1500–7800)
Neutrophils Relative %: 37 %
Platelets: 158 10*3/uL (ref 140–400)
RBC: 3.92 MIL/uL (ref 3.80–5.10)
RDW: 14.3 % (ref 11.0–15.0)
WBC: 5.2 10*3/uL (ref 3.8–10.8)

## 2016-11-05 NOTE — Telephone Encounter (Signed)
Patient called stating that she needed her Enbrel refilled.  Her current prescription has 1 refill left which she will receive this coming Wednesday.  She needs a prescription that has refills til the end of the year so the pharmacy will automatically send her Enbrel.  CB#[phone].  Thank you.

## 2016-11-05 NOTE — Telephone Encounter (Signed)
Prescription has been faxed.

## 2016-11-05 NOTE — Telephone Encounter (Signed)
Patient advised we do not fill the prescription for the Enbrel for a year. We only do a 90 supply at one time. Patient advised it is so we can monitor her labs. Patient verbalized understanding. Patient states she needs a refill sent  the [employer]. Last Visit: 09/21/16 Next Visit: 01/20/17 Labs: 07/30/16 stable  PPD: 06/04/16  Prescription given to Dr. Estanislado Pandy to sign

## 2016-11-07 ENCOUNTER — Other Ambulatory Visit: Payer: Self-pay | Admitting: Rheumatology

## 2016-11-07 NOTE — Progress Notes (Signed)
Labs normal, glucose elevated

## 2016-11-08 NOTE — Telephone Encounter (Signed)
Ok , labs due now.

## 2016-11-08 NOTE — Telephone Encounter (Signed)
Last Visit: 09/21/16 Next Visit: 01/20/17 Labs: 07/30/16 stable   Okay to refill Arava?

## 2016-11-08 NOTE — Telephone Encounter (Signed)
Labs 11/05/16 stable

## 2016-11-25 ENCOUNTER — Other Ambulatory Visit: Payer: Self-pay | Admitting: Rheumatology

## 2016-11-25 MED ORDER — LEFLUNOMIDE 10 MG PO TABS: 10.0000 mg | ORAL_TABLET | Freq: Every day | ORAL | 0 refills | Status: DC

## 2016-11-25 NOTE — Telephone Encounter (Signed)
Prescription sent to local pharmacy. Prescription was okay per Dr. Estanislado Pandy last month.

## 2016-11-25 NOTE — Telephone Encounter (Signed)
Patient is requesting [facility]omide [facility] [facility]. Patient can no longer use the mail order pharmacy for this rx due to insurance changes.

## 2016-12-21 ENCOUNTER — Other Ambulatory Visit: Payer: Self-pay

## 2016-12-23 ENCOUNTER — Other Ambulatory Visit: Payer: Self-pay

## 2017-01-07 NOTE — Progress Notes (Signed)
Office Visit Note  Patient: [name]             Date of Birth: [birth year]           MRN: [id number]             PCP: Rennis Golden Referring: Rennis Golden Visit Date: 01/20/2017 Occupation: @GUAROCC @    Subjective:  No chief complaint on file.   History of Present Illness: [name] is a 71 y.o. female  Last seen 5 months ago. Patient states that she is doing very well with her Enbrel and Arava till therapy for the treatment of her rheumatoid arthritis. She does not have any joint pain, swelling, stiffness.  Her main complaint today is her left greater trochanter bursa is painful and problematic. It's been going on for 2 months. No falls or injuries.  Patient saw her PCP yesterday for regular visit. Were able to do CMP, lipid profile, hemoglobin A1c. However, they did not do a CBC with differential. Therefore, I will draw CBC with differential today to complete her lab work to monitor the effects of her immunosuppressive therapy.  Patient has a history of osteopenia. In our records, her last bone density was done in 2015. She had a T score of -1.5 of the right femoral neck with a BMD of 0.831. Patient is uncertain whether she has an updated bone density done and she will look for any she does have it she'll bring it to our office. If it is unavailable, she is due for repeat bone density and we'll be happy to order it from [facility] at the appropriate time.     Activities of Daily Living:  Patient reports morning stiffness for 5 minutes.   Patient Reports nocturnal pain.  Difficulty dressing/grooming: Denies Difficulty climbing stairs: Reports Difficulty getting out of chair: Reports Difficulty using hands for taps, buttons, cutlery, and/or writing: Denies   Review of Systems  Constitutional: Negative for fatigue.  HENT: Negative for mouth sores and mouth dryness.   Eyes: Negative for dryness.  Respiratory: Negative for shortness of breath.     Gastrointestinal: Negative for constipation and diarrhea.  Musculoskeletal: Negative for myalgias and myalgias.  Skin: Negative for sensitivity to sunlight.  Psychiatric/Behavioral: Negative for decreased concentration and sleep disturbance.    PMFS History:  Patient Active Problem List   Diagnosis Date Noted  . History of hypertension 08/20/2016  . History of diabetes mellitus 08/20/2016  . High risk medication use 08/20/2016  . Primary osteoarthritis of both hands 08/20/2016  . Hypomagnesemia 07/21/2016  . Left thyroid nodule 03/05/2016  . Diabetic eye exam ([place]) 10/15/2014  . Multiple pulmonary nodules 04/04/2014  . Malnutrition of moderate degree ([place]) 03/18/2014  . Sepsis ([place]) 03/16/2014  . Anemia 11/21/2013  . 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 Bursitis of right shoulder   . Diabetes mellitus type 2, uncomplicated ([place]) 25/49/8264  . GERD (gastroesophageal reflux disease)   . Obesity 12/27/2012  . Rheumatoid arthritis ([place])   . Essential hypertension, benign 10/14/2012  . Hyperlipidemia   . Hormone replacement therapy (postmenopausal)   . Fatty liver disease, nonalcoholic   . Osteoarthritis of right knee 08/05/2012    Past Medical History:  Diagnosis Date  . Arthritis    osteoarthritis  . Bursitis of right shoulder   . Chronic cholecystitis with calculus 05/04/2013  . 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 Diabetes type 2, controlled ([place]) 05/04/2013  .  Fatty liver disease, nonalcoholic 3474   hospitalized, MRCP dx fatty liver  . GERD (gastroesophageal reflux disease)   . Hormone replacement therapy (postmenopausal)   . Hyperlipidemia   . Hypertension   . Normal cardiac stress test    pt can't remember when or where  . Obesity, unspecified 12/27/2012  . Rheumatoid arthritis(714.0) 09/28/2012  . 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 Unspecified essential hypertension 05/04/2013    Family History  Problem Relation Age  of Onset  . Cancer Mother        breast  . Heart attack Mother   . Stroke Father   . Diabetes Other   . Heart disease Other   . Diabetes Sister    Past Surgical History:  Procedure Laterality Date  . ABDOMINAL HYSTERECTOMY    . CHOLECYSTECTOMY N/A 05/04/2013   Procedure: LAPAROSCOPIC CHOLECYSTECTOMY WITH INTRAOPERATIVE CHOLANGIOGRAM;  Surgeon: Haywood Lasso, MD;  Location: [facility];  Service: General;  Laterality: N/A;  . ERCP N/A 05/02/2013   Procedure: ENDOSCOPIC RETROGRADE CHOLANGIOPANCREATOGRAPHY (ERCP);  Surgeon: Beryle Beams, MD;  Location: [facility];  Service: Endoscopy;  Laterality: N/A;  . JOINT REPLACEMENT     left knee  . NASAL SINUS SURGERY    . SPHINCTEROTOMY  05/02/2013   Procedure: SPHINCTEROTOMY;  Surgeon: Beryle Beams, MD;  Location: [facility];  Service: Endoscopy;;  . TONSILLECTOMY    . TOTAL KNEE ARTHROPLASTY  08/01/2012   Procedure: TOTAL KNEE ARTHROPLASTY;  Surgeon: Meredith Pel, MD;  Location: [facility];  Service: Orthopedics;  Laterality: Right;  Right total knee arthroplasty  . TOTAL KNEE ARTHROPLASTY  08/01/2012   RIGHT  KNEE   Social History   Social History Narrative  . No narrative on file     Objective: Vital Signs: BP 110/60 (BP Location: Left Arm, Patient Position: Sitting)   Pulse 75   Resp 14   Ht 4\' 11"  (1.499 m)   Wt 190 lb (86.2 kg)   BMI 38.38 kg/m    Physical Exam  Constitutional: She is oriented to person, place, and time. She appears well-developed and well-nourished.  HENT:  Head: Normocephalic and atraumatic.  Eyes: EOM are normal. Pupils are equal, round, and reactive to light.  Cardiovascular: Normal rate, regular rhythm and normal heart sounds.  Exam reveals no gallop and no friction rub.   No murmur heard. Pulmonary/Chest: Effort normal and breath sounds normal. She has no wheezes. She has no rales.  Abdominal: Soft. Bowel sounds are normal. She exhibits no distension. There is no tenderness. There is no  guarding. No hernia.  Musculoskeletal: Normal range of motion. She exhibits no edema, tenderness or deformity.  Lymphadenopathy:    She has no cervical adenopathy.  Neurological: She is alert and oriented to person, place, and time. Coordination normal.  Skin: Skin is warm and dry. Capillary refill takes less than 2 seconds. No rash noted.  Psychiatric: She has a normal mood and affect. Her behavior is normal.  Nursing note and vitals reviewed.    Musculoskeletal Exam:  Full range of motion of all joints Grip strength is equal and strong bilaterally Fiber myalgia tender points are all absent  CDAI Exam: CDAI Homunculus Exam:   Joint Counts:  CDAI Tender Joint count: 0 CDAI Swollen Joint count: 0  Global Assessments:  Patient Global Assessment: 8 Provider Global Assessment: 8  CDAI Calculated Score: 16  No synovitis on exam Patient rates her discomfort as 8 secondary to left greater trochanter bursitis  Investigation: Findings:  Negative PPD 05/2016   CBC Latest Ref Rng & Units 11/05/2016 07/30/2016 03/30/2016  WBC 3.8 - 10.8 K/uL 5.2 4.1 5.4  Hemoglobin 11.7 - 15.5 g/dL 12.3 12.6 12.2  Hematocrit 35.0 - 45.0 % 37.2 39.4 37.6  Platelets 140 - 400 K/uL 158 193 179   CMP Latest Ref Rng & Units 01/19/2017 11/05/2016 07/30/2016  Glucose 70 - 99 mg/dL 136(H) 143(H) 135(H)  BUN 7 - 25 mg/dL 20 21 18   Creatinine 0.60 - 0.93 mg/dL 1.06(H) 0.77 0.95(H)  Sodium 135 - 146 mmol/L 142 140 143  Potassium 3.5 - 5.3 mmol/L 3.6 3.9 4.4  Chloride 98 - 110 mmol/L 103 105 107  CO2 20 - 31 mmol/L 25 25 25   Calcium 8.6 - 10.4 mg/dL 9.8 9.1 9.5  Total Protein 6.1 - 8.1 g/dL 6.3 6.6 6.5  Total Bilirubin 0.2 - 1.2 mg/dL 0.4 0.4 0.4  Alkaline Phos 33 - 130 U/L 81 73 72  AST 10 - 35 U/L 24 23 17   ALT 6 - 29 U/L 18 19 13    Office Visit on 01/19/2017  Component Date Value Ref Range Status  . Sodium 01/19/2017 142  135 - 146 mmol/L Final  . Potassium 01/19/2017 3.6  3.5 - 5.3 mmol/L Final  .  Chloride 01/19/2017 103  98 - 110 mmol/L Final  . CO2 01/19/2017 25  20 - 31 mmol/L Final  . Glucose, Bld 01/19/2017 136* 70 - 99 mg/dL Final  . BUN 01/19/2017 20  7 - 25 mg/dL Final  . Creat 01/19/2017 1.06* 0.60 - 0.93 mg/dL Final   Comment:   For patients > or = 73 years of age: The upper reference limit for Creatinine is approximately 13% higher for people identified as African-American.     . Total Bilirubin 01/19/2017 0.4  0.2 - 1.2 mg/dL Final  . Alkaline Phosphatase 01/19/2017 81  33 - 130 U/L Final  . AST 01/19/2017 24  10 - 35 U/L Final  . ALT 01/19/2017 18  6 - 29 U/L Final  . Total Protein 01/19/2017 6.3  6.1 - 8.1 g/dL Final  . Albumin 01/19/2017 3.6  3.6 - 5.1 g/dL Final  . Calcium 01/19/2017 9.8  8.6 - 10.4 mg/dL Final  . GFR, Est African American 01/19/2017 61  >=60 mL/min Final  . GFR, Est Non African American 01/19/2017 53* >=60 mL/min Final  . Cholesterol 01/19/2017 171  <200 mg/dL Final  . Triglycerides 01/19/2017 153* <150 mg/dL Final  . HDL 01/19/2017 69  >50 mg/dL Final  . Total CHOL/HDL Ratio 01/19/2017 2.5  <5.0 Ratio Final  . VLDL 01/19/2017 31* <30 mg/dL Final  . LDL Cholesterol 01/19/2017 71  <100 mg/dL Final  . Hgb A1c MFr Bld 01/19/2017 6.7* <5.7 % Final   Comment:   For someone without known diabetes, a hemoglobin A1c value of 6.5% or greater indicates that they may have diabetes and this should be confirmed with a follow-up test.   For someone with known diabetes, a value <7% indicates that their diabetes is well controlled and a value greater than or equal to 7% indicates suboptimal control. A1c targets should be individualized based on duration of diabetes, age, comorbid conditions, and other considerations.   Currently, no consensus exists for use of hemoglobin A1c for diagnosis of diabetes for children.     . Mean Plasma Glucose 01/19/2017 146  mg/dL Final  . Magnesium 01/19/2017 1.2* 1.5 - 2.5 mg/dL Final     Imaging: No  results  found.  Speciality Comments: No specialty comments available.    Procedures:  Large Joint Inj Date/Time: 01/20/2017 12:10 PM Performed by: Eliezer Lofts Authorized by: Eliezer Lofts   Consent Given by:  Patient Site marked: the procedure site was marked   Timeout: prior to procedure the correct patient, procedure, and site was verified   Indications:  Pain Location:  Hip Site:  R hip joint Prep: patient was prepped and draped in usual sterile fashion   Needle Size:  27 G Approach:  Superior Ultrasound Guidance: No   Fluoroscopic Guidance: No   Arthrogram: No   Medications:  1.5 mL lidocaine 1 %; 40 mg triamcinolone acetonide 40 MG/ML Aspiration Attempted: Yes   Aspirate amount (mL):  0 Patient tolerance:  Patient tolerated the procedure well with no immediate complications   Allergies: Monopril [fosinopril] and Norvasc [amlodipine besylate]   Assessment / Plan:     Visit Diagnoses: Rheumatoid arthritis involving multiple sites with positive rheumatoid factor ([place])  High risk medication use - Plan: CBC with Differential/Platelet, Quantiferon tb gold assay (blood)  History of hypertension  History of diabetes mellitus  Hypomagnesemia  Fatty liver disease, nonalcoholic  Bursitis of right shoulder  Primary osteoarthritis of both hands  Primary osteoarthritis of right knee  Malnutrition of moderate degree (HCC)  History of multiple pulmonary nodules  Osteopenia of right hip - Plan: DG Bone Density   Plan: #1: Rheumatoid arthritis. Doing well. No joint pain, swelling, stiffness. Morning stiffness is less than 10 minutes. Patient is getting adequate response from her current dual therapy (Enbrel every week, Arava 10 mg every day)  #2: High risk prescription. Patient's last CMP was yesterday at her PCPs office; unfortunately, PCP only needed CMP and therefore did not draw the CBC (which I need) and so for therefore I will draw today in the office.; She will  need repeat CBC with differential and CMP with GFR in about 3 months Patient takes Enbrel once a week Today patient takes Arava 10 mg daily Adequate response  #3: Left greater trochanteric bursitis Patient has had this problem for about 2 months now. Unable to sleep on the left side. See procedure notes for full details. I will inject the patient with 40 mg of Kenalog mixed with 2 mL's of Xylocaine preservative free. Patient tolerated procedure well. Patient can also use Voltaren gel in the future Patient states that her left greater trochanter bursa is 50% improved after her cortisone injection given 5 minutes ago.  #4: Right shoulder joint bursitis. Patient has not knee recent injuries. She is getting good benefit with Voltaren gel. We discussed in detail the proper way of using Voltaren gel as well as its risks and benefits and proper dosing. She has adequate amount of Voltaren gel at home at this time.  #5: Patient will need a repeat TB test in October/November 2018. Note: She did a TB test in October 2016 and was negative TB test in November 2017 was indeterminate. Dr. Estanislado Pandy said it is fine for the patient to get a TB gold this October/November 2018.  #6: DEXA ordered at [facility] scheduled for June 2018 (patient's preference as to the date and time). Her last bone density from May 2015 showed osteopenia  #7: Return to clinic in 5 months  #8: Reviewing her medications, I noted that patient's leflunomide was crossed out from her medication list on April 2018. A month certain why that was done. I reviewed the patient's labs and based on my  assessment, patient should continue Arava with her Enbrel. Therefore. I have refilled her Arava 10 mg; 1 pill daily; ninety-day supply with no refill. I sent a message to Seth Bake as follows so she can review the chart behind me and alert me if there was an issue.  Orders: Orders Placed This Encounter  Procedures  . Large Joint  Injection/Arthrocentesis  . DG Bone Density  . CBC with Differential/Platelet  . Quantiferon tb gold assay (blood)   No orders of the defined types were placed in this encounter.   Face-to-face time spent with patient was 40 minutes. 50% of time was spent in counseling and coordination of care.  Follow-Up Instructions: Return in about 5 months (around 06/22/2017) for RA,.   Eliezer Lofts, PA-C  Note - This record has been created using [employer].  Chart creation errors have been sought, but may not always  have been located. Such creation errors do not reflect on  the standard of medical care.

## 2017-01-15 ENCOUNTER — Other Ambulatory Visit: Payer: Self-pay | Admitting: Physician Assistant

## 2017-01-19 ENCOUNTER — Encounter: Payer: Self-pay | Admitting: Physician Assistant

## 2017-01-19 ENCOUNTER — Ambulatory Visit (INDEPENDENT_AMBULATORY_CARE_PROVIDER_SITE_OTHER): Payer: PPO | Admitting: Physician Assistant

## 2017-01-19 VITALS — BP 132/84 | HR 75 | Temp 98.3°F | Resp 16 | Wt 188.6 lb

## 2017-01-19 DIAGNOSIS — K219 Gastro-esophageal reflux disease without esophagitis: Secondary | ICD-10-CM

## 2017-01-19 DIAGNOSIS — I10 Essential (primary) hypertension: Secondary | ICD-10-CM | POA: Diagnosis not present

## 2017-01-19 DIAGNOSIS — E119 Type 2 diabetes mellitus without complications: Secondary | ICD-10-CM

## 2017-01-19 DIAGNOSIS — E785 Hyperlipidemia, unspecified: Secondary | ICD-10-CM

## 2017-01-19 LAB — COMPLETE METABOLIC PANEL WITH GFR
ALT: 18 U/L (ref 6–29)
AST: 24 U/L (ref 10–35)
Albumin: 3.6 g/dL (ref 3.6–5.1)
Alkaline Phosphatase: 81 U/L (ref 33–130)
BUN: 20 mg/dL (ref 7–25)
CO2: 25 mmol/L (ref 20–31)
Calcium: 9.8 mg/dL (ref 8.6–10.4)
Chloride: 103 mmol/L (ref 98–110)
Creat: 1.06 mg/dL — ABNORMAL HIGH (ref 0.60–0.93)
GFR, EST AFRICAN AMERICAN: 61 mL/min (ref >=60)
GFR, EST NON AFRICAN AMERICAN: 53 mL/min — AB (ref >=60)
GLUCOSE: 136 mg/dL — AB (ref 70–99)
POTASSIUM: 3.6 mmol/L (ref 3.5–5.3)
SODIUM: 142 mmol/L (ref 135–146)
Total Bilirubin: 0.4 mg/dL (ref 0.2–1.2)
Total Protein: 6.3 g/dL (ref 6.1–8.1)

## 2017-01-19 LAB — LIPID PANEL
CHOL/HDL RATIO: 2.5 ratio (ref <5.0)
CHOLESTEROL: 171 mg/dL (ref <200)
HDL: 69 mg/dL (ref >50)
LDL Cholesterol: 71 mg/dL (ref <100)
TRIGLYCERIDES: 153 mg/dL — AB (ref <150)
VLDL: 31 mg/dL — ABNORMAL HIGH (ref <30)

## 2017-01-19 MED ORDER — OMEPRAZOLE 20 MG PO CPDR: 20.0000 mg | DELAYED_RELEASE_CAPSULE | Freq: Every day | ORAL | 3 refills | Status: DC

## 2017-01-19 MED ORDER — METOPROLOL TARTRATE 25 MG PO TABS: ORAL_TABLET | ORAL | 1 refills | Status: DC

## 2017-01-19 NOTE — Progress Notes (Addendum)
Patient ID: [name] MRN: [id number], DOB: 02-03-1946, 71 y.o. Date of Encounter: @DATE @  Chief Complaint:  Chief Complaint  Patient presents with  . 6 month routine f/u    HPI: 71 y.o. year old white female  presents for routine followup office visit.     She had a lot of medical problems in 2014-2015.   Approx 06/2013 she had laparoscopic cholecystectomy.   Her next routine office visit with me was 11/21/2013. She had a long hospitalization---10/15/13-10/21/2013-- with symptoms of mostly vomiting. Many many tests are performed. The patient stated that no definitive diagnosis could ever be confirmed and it was felt that she just has some nonspecific virus.  She had another hospitalization  03/16/14 - 03/19/14. During that hospitalization, she was found to have a pulmonary nodule. Since then she has been having follow-up with Dr. Melvyn Novas.    05/22/2014 she reported that she has been feeling good and has gotten back to her walking. [name] that she walks with her granddaughter and her baby. Says  they  "walk whenever they can"-- which usually is about 3-5 times per week. At all of her visits with me since then, she has continued to tell me that she has continued to do these walks with her granddaughter and her baby whenever the weather is good. Again at her visit today 12/2014 she reports this.  01/09/2015: she says this is the same---still walking same amount. Still feeling good.  She is taking her blood pressure medications as directed but no adverse effects. No lower extremity edema and no lightheadedness. She is taking Lipitor at 20 mg. No myalgias or other adverse effects. Last A1C 10/10/14--A1C was down to 5.7. Metformin was discontinued completely. She says that she did stop this. She says that she has not been checking her blood sugar.Says that they did have a screening at her work and at that time her fasting sugar was 115.   Rheumatoid arthritis(714.0) Per Dr.  Estanislado Pandy ADDENDUM--ADDED 05/21/2015: I received office note from Dr. Estanislado Pandy dated 05/16/15. The following is copied from that [facility] note: "Pt reports that she has been experiencing increased pain and discomfort in multiple joints. She has pain in her bilateral shoulders, bilateral wrist joints and hands. Tenderness on palpation over her MCPs and PIPs. Also has thickening of bilateral CMC joints due to underlying osteoarthritis. She has swelling and warmth over bilateral ankle joints. She appears to be having a flare of her rheumatoid arthritis. She is on high risk prescription, taking methotrexate 6 tablets per week, which is not working as well for her. She could not increase her dose of methotrexate any further. She does have osteoarthritis, which causes some discomfort also. Different treatment options and their side effects were discussed at length. Discussed option of using combination therapy and adding plaquenil to her regimen. She was prescribed Plaquenil 200 mg 1 by mouth daily 90 day supply with 1 refill. Was advised to get eye exams to monitor for ocular toxicity. Orders given for labs every 3 months to monitor drug toxicity. They also discussed that most likely she would have to get on more aggressive therapy than just methotrexate and Plaquenil in the future. To cover that she checked TB cold, HIV, SPEP, immunoglobulins. Dr. Estanislado Pandy noted she is anemic and told her to follow-up with her PCP regarding that.  07/14/2015: She states that she has not been doing her walking. Says that she's been hurting. Says that it just hasn't worked out. Says she is feeling  better with the plaquenil. She is taking all blood pressure medicines as directed. No lower extremity edema. No lightheadedness. She is taking her Lipitor. No myalgias or other adverse effects. She is trying to be careful with her carbohydrates.   ADDENDUM ADDED 12/08/2015: Received OV note from Dr. Estanislado Pandy dated 12/02/2105. "She states  she has been having increased pain and swelling in her hands. Due to her elevated creatinine, we had to decrease her methotrexate from 6 to 5 and now to 4 tablets per week and she states she has been having increased pain and swelling to the point she is having difficulty making a fist." "---She is taking Plaquenil 200mg  a day along with methotrexate, which is not controlling her disease. We had detailed discussion regarding her current situation. We will d/c methotrexate and start [place]. Continue the Plaquenil for now---"  01/12/2016: Says she is still having significant pain. Still unable to do her walking, exercise. She is taking all blood pressure medicines as directed. No lower extremity edema. No lightheadedness. She is taking her Lipitor. No myalgias or other adverse effects. She is trying to be careful with her carbohydrates.   Addendum Added 06/03/2016: Received OV note from Dr. Estanislado Pandy dated 05/25/2016. At that visit patient reported that she had been having flare of her arthritis.  She no longer felt like the Arava and Plaquenil were adequately addressing her rheumatoid arthritis.  At that visit they discussed Enbrel at length and the patient was agreeable to try biologic. Planned: Enbrel. If Enbrel fails, then try [place] while on Enbrel and stop the Plaquenil.  "Prescription for Pneumovax. Check with PCP when the last one was given and take if it is due" I reviewed her Immunizations---She received Prevnar 13---07/04/2013.                                              ----She received Pneumovax 23--06/23/2011, 08/02/2012   07/19/2016: She states that she thinks that the Enbrel is starting to work some. Says that her hands and other joints don't feel quite as stiff and feel like they are moving a little bit more. She states that she has had this bad cough for a couple weeks now. Says that about a month ago she had cold symptoms and stuffy nose but then that got better  but the cough has lingered and now the cough has been worse for about 1-1/2-2 weeks now. Says that her daughter lives with her---her daughter recently had community-acquired pneumonia.  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  She is taking blood pressure medications as directed. No lightheadedness or other adverse effects.  She is taking Lipitor as directed. No myalgias or other adverse effects.  She is being careful with her diet and following low carbohydrate diet. Exercise is limited secondary to her arthritis.   01/19/2017: She still sees Dr. Estanislado Pandy for her RA. Says that she has her on Enbrel now and it is working great. Says that she is walking 1-2 miles 3 days a week. Asked if she was sticking with low-carb diet. She says that she has been trying to cut back on some things that she had been  slacking off on. She is taking blood pressure medications as directed. No lightheadedness or other adverse effects. She is taking Lipitor as directed. No myalgias or other adverse effects. She is taking omeprazole for GERD and is taking her magnesium supplement. She has no specific complaints or concerns to address today.    Past Medical History:  Diagnosis Date  . Arthritis    osteoarthritis  . Bursitis of right shoulder   . Chronic cholecystitis with calculus 05/04/2013  . 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 Diabetes type 2, controlled ([facility]) 05/04/2013  . Fatty liver disease, nonalcoholic 1601   hospitalized, MRCP dx fatty liver  . GERD (gastroesophageal reflux disease)   . Hormone replacement therapy (postmenopausal)   . Hyperlipidemia   . Hypertension   . Normal cardiac stress test    pt can't remember when or where  . Obesity, unspecified 12/27/2012  . Rheumatoid  arthritis(714.0) 09/28/2012  . 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 Unspecified essential hypertension 05/04/2013     Home Meds: Outpatient Medications Prior to Visit  Medication Sig Dispense Refill  . amLODipine (NORVASC) 5 MG tablet TAKE 1 TABLET DAILY 90 tablet 1  . aspirin EC 81 MG tablet Take 81 mg by mouth daily.    Marland Kitchen atorvastatin (LIPITOR) 10 MG tablet TAKE 1 TABLET DAILY (NEW DOSE) 90 tablet 1  . Calcium Carbonate-Vitamin D (CALCIUM 600 + D PO) Take 1 tablet by mouth 2 (two) times daily.    . cloNIDine (CATAPRES) 0.3 MG tablet TAKE 1 TABLET TWICE A DAY 180 tablet 1  . cyanocobalamin 500 MCG tablet Take 1 tablet (500 mcg total) by mouth daily. (Patient not taking: Reported on 09/21/2016) 30 tablet 0  . diclofenac sodium (VOLTAREN) 1 % GEL Apply 3 g topically daily as needed (pain). To 3 large joints    . diclofenac sodium (VOLTAREN) 1 % GEL Apply 3 grams to 3 large joints up to 3 times daily. 3 Tube 3  . ENBREL SURECLICK 50 MG/ML injection INJECT 50 MG (0.98 ML) UNDER THE SKIN ONCE A WEEK 3.92 mL 2  . estradiol (ESTRACE) 0.5 MG tablet Take 0.5 mg by mouth 2 (two) times daily.    . fish oil-omega-3 fatty acids 1000 MG capsule Take 1 g by mouth at bedtime.     . folic acid (FOLVITE) 1 MG tablet Take 1 tablet by mouth 2 (two) times daily.     Marland Kitchen losartan-hydrochlorothiazide (HYZAAR) 100-12.5 MG tablet TAKE 1 TABLET DAILY 90 tablet 1  . MAG64 535 (64 Mg) MG TBCR TAKE 2 TABLETS (128 MG TOTAL) BY MOUTH DAILY.  5  . metoprolol tartrate (LOPRESSOR) 25 MG tablet TAKE ONE-HALF (1/2) TABLET TWICE A DAY 90 tablet 0  . Multiple Vitamins-Minerals (MULTIVITAMINS THER. W/MINERALS) TABS Take 1 tablet by mouth daily.    Marland Kitchen omeprazole (PRILOSEC) 20 MG capsule TAKE 1 CAPSULE DAILY 90 capsule 3  . azithromycin (ZITHROMAX) 250 MG tablet Day 1: take 2 daily. Days 2-5: Take 1 daily. (Patient not taking: Reported on 08/26/2016) 6 tablet 0  . clindamycin (CLEOCIN) 300 MG capsule Take 1 capsule (300 mg  total) by mouth 3 (three) times daily. (Patient not taking: Reported on 08/26/2016) 30 capsule 0  . diazepam (VALIUM) 10 MG tablet Take 1 tablet (10 mg total) by mouth every 8 (eight) hours as needed (muscle spasms). 30 tablet 1  . diclofenac (VOLTAREN) 75 MG EC tablet Take 1 tablet (75 mg  total) by mouth 2 (two) times daily. (Patient not taking: Reported on 09/21/2016) 30 tablet 0  . HYDROcodone-homatropine (HYCODAN) 5-1.5 MG/5ML syrup Take 5 mLs by mouth every 8 (eight) hours as needed for cough. (Patient not taking: Reported on 09/21/2016) 120 mL 0  . hydroxychloroquine (PLAQUENIL) 200 MG tablet Take 200 mg by mouth daily.  1  . leflunomide (ARAVA) 10 MG tablet Take 1 tablet (10 mg total) by mouth daily. 90 tablet 0  . levofloxacin (LEVAQUIN) 750 MG tablet Take 1 tablet (750 mg total) by mouth daily. (Patient not taking: Reported on 09/21/2016) 7 tablet 0  . magnesium chloride (SLOW-MAG) 64 MG TBEC SR tablet Take 2 tablets (128 mg total) by mouth daily. (Patient not taking: Reported on 09/21/2016) 60 tablet 5  . predniSONE (DELTASONE) 20 MG tablet Take 1 tablet (20 mg total) by mouth daily with breakfast. (Patient not taking: Reported on 09/21/2016) 5 tablet 0   No facility-administered medications prior to visit.     Allergies:  Allergies  Allergen Reactions  . Monopril [Fosinopril] Cough  . Norvasc [Amlodipine Besylate] Other (See Comments)    10 mg dose causes LE edema.  Tolerates 5mg  dose.    Social History   Social History  . Marital status: Divorced    Spouse name: N/A  . Number of children: N/A  . Years of education: N/A   Occupational History  . Not on file.   Social History Main Topics  . Smoking status: Former Smoker    Packs/day: 0.25    Years: 2.00    Types: Cigarettes    Quit date: 11/21/2013  . Smokeless tobacco: Never Used     Comment: smoked regulary for approx 10 yrs- stopped in her 20's- but still smokes occ cig/Aug 2015  . Alcohol use Yes     Comment: occasional   . Drug use: No  . Sexual activity: No   Other Topics Concern  . Not on file   Social History Narrative  . No narrative on file    Family History  Problem Relation Age of Onset  . Cancer Mother        breast  . Heart attack Mother   . Stroke Father   . Diabetes Other   . Heart disease Other   . Diabetes Sister      Review of Systems:  See HPI for pertinent ROS. All other ROS negative.    Physical Exam: Blood pressure 132/84, pulse 75, temperature 98.3 F (36.8 C), temperature source Oral, resp. rate 16, weight 188 lb 9.6 oz (85.5 kg), SpO2 95 %., Body mass index is 38.09 kg/m. General: WNWD WF. Appears in no acute distress. Neck: Supple. No thyromegaly. No lymphadenopathy. No carotid bruit. Lungs: Clear bilaterally to auscultation without wheezes, rales, or rhonchi. Breathing is unlabored. Heart: RRR with S1 S2. No murmurs, rubs, or gallops. Abdomen: Soft, non-tender, non-distended with normoactive bowel sounds. No hepatomegaly. No rebound/guarding. No obvious abdominal masses. Musculoskeletal:  Strength and tone normal for age. Extremities/Skin: Warm and dry. Diabetic foot exam: Inspection is normal. No deformities, no ulcerations no areas of skin breakdown bilaterally. Sensation is intact throughout. 2+ posterior tibialis pulses bilaterally. No palpable dorsalis pedis pulses bilaterally. Neuro: Alert and oriented X 3. Moves all extremities spontaneously. Gait is normal. CNII-XII grossly in tact. Psych:  Responds to questions appropriately with a normal affect.     ASSESSMENT AND PLAN:  71 y.o. year old female with    Hypomagnesemia 01/19/2017: In past--checked Mg b/c  she was on PPI---Mg was low---recheck now. - Magnesium   Diabetes type 2, controlled  Last microalbumin  01/09/15, 01/12/2016  She is on aspirin 81 mg a day. She is on ARB. She is intolerant to ACE inhibitors secondary to cough. She is on statin. LDL has been at goal of less than 70.  She saw  Ophthalmologist for diabetic eye exam in February 2017---"he says everything looks good" Foot exam is normal. She reports that she has needed no neuropathy symptoms.  01/19/2017: Check HgbA1C   Essential hypertension,  01/19/2017: At goal/controlled.   On ARB. Intolerant to ACE inhibitor.   Continue current medications. Check lab to monitor.   Hyperlipidemia On Lipitor.At Lab Result 05/22/14--decreased Lipitor dose to 20 mg . F/U  FLP 07/10/14---FLP Excellent.  01/19/2017:-----Recheck labs again today to monitor. She is fasting.   Fatty liver disease, nonalcoholic 02/17/3150: LFTs have been stable. Recheck LFTs now  Anemia, unspecified anemia type - CBC was done 05/22/14 and was stable.  Hormone replacement therapy (postmenopausal) 01/19/2017:Per Gyn  Rheumatoid arthritis(714.0) 01/19/2017:Per Dr. Estanislado Pandy  ADDENDUM--ADDED 05/21/2015: I received office note from Dr. Estanislado Pandy dated 05/16/15. The following is copied from that [facility] note: "Pt reports that she has been experiencing increased pain and discomfort in multiple joints. She has pain in her bilateral shoulders, bilateral wrist joints and hands. Tenderness on palpation over her MCPs and PIPs. Also has thickening of bilateral CMC joints due to underlying osteoarthritis. She has swelling and warmth over bilateral ankle joints. She appears to be having a flare of her rheumatoid arthritis. She is on high risk prescription, taking methotrexate 6 tablets per week, which is not working as well for her. She could not increase her dose of methotrexate any further. She does have osteoarthritis, which causes some discomfort also. Different treatment options and their side effects were discussed at length. Discussed option of using combination therapy and adding plaquenil to her regimen. She was prescribed Plaquenil 200 mg 1 by mouth daily 90 day supply with 1 refill. Was advised to get eye exams to monitor for ocular toxicity. Orders given for labs  every 3 months to monitor drug toxicity. They also discussed that most likely she would have to get on more aggressive therapy than just methotrexate and Plaquenil in the future. To cover that she checked TB cold, HIV, SPEP, immunoglobulins. Dr. Estanislado Pandy noted she is anemic and told her to follow-up with her PCP regarding that.  ADDENDUM ADDED 12/08/2015: Received OV note from Dr. Estanislado Pandy dated 12/02/2105. "She states she has been having increased pain and swelling in her hands. Due to her elevated creatinine, we had to decrease her methotrexate from 6 to 5 and now to 4 tablets per week and she states she has been having increased pain and swelling to the point she is having difficulty making a fist." "---She is taking Plaquenil 200mg  a day along with methotrexate, which is not controlling her disease. We had detailed discussion regarding her current situation. We will d/c methotrexate and start [place]. Continue the Plaquenil for now---"  Gastroesophageal reflux disease, esophagitis presence not specified 01/19/2017:: This is controlled with current medication.  Multiple pulmonary nodules She has been following up with Dr. Melvyn Novas at pulmonary. At [facility] 05/22/14 she said that she is scheduled for "another scan in November". At [facility] 07/10/14--she had f/u scan November. Says there was no change and she is to f/u 6 months.  --Says at last check, no change. Dr. Melvyn Novas told her can wait 2 years to  repeat scan. 01/19/2017:Reminded her to f/u with Dr. Minna Merritts  [facility]   Colonoscopy: 04/30/2011 colon polyp. She says she was told to repeat 10 years.    Pap smear, mammogram, and BMD : Sees GYN annually and may manage this.  At [facility] 11/21/2013 she reported that they do the mammograms there in the gynecology office itself. [name] this is at [facility]. I had her sign a release that day so that we could get those records to enter into our abstraction for quality metrics.   Addendum Added  02/17/2017----Received Bone Density Report from [facility]--Test performed 01/26/2017 T scores were +0.20, -1.80, -1.50, -1.20, -1.40, - 0.6, -0.5, considered osteopenia. She is to continue calcium and vitamin D and weightbearing exercise.   Immunizations:  Tdap: 05/2011  Pneumovax: 05/2011  Prevnar 13: 07/04/2013  Influenza vaccine she did receive this in the fall of 2014. Says she received influenza vaccine at work for free ---to cover 2015-2016 Flu Season. Received 05/2015/ Received 2017 Zostavax: 09/2011  Until 12/2014, she had been having office as its with me every 3 months because she had been requiring diabetic medications. However at that time she was able to come off of diabetic medications. At that time, it was decided she can wait 6 months to have follow-up visit. However I did tell her to occasionally check her blood sugar just to make sure that it is not starting to drift upward. Routine followup office visit in 6 months or sooner if needed.    Signed, [address] [place], [place], BSFM 01/19/2017 8:16 AM

## 2017-01-20 ENCOUNTER — Encounter: Payer: Self-pay | Admitting: Rheumatology

## 2017-01-20 ENCOUNTER — Ambulatory Visit (INDEPENDENT_AMBULATORY_CARE_PROVIDER_SITE_OTHER): Payer: PPO | Admitting: Rheumatology

## 2017-01-20 VITALS — BP 110/60 | HR 75 | Resp 14 | Ht 59.0 in | Wt 190.0 lb

## 2017-01-20 DIAGNOSIS — M1711 Unilateral primary osteoarthritis, right knee: Secondary | ICD-10-CM | POA: Diagnosis not present

## 2017-01-20 DIAGNOSIS — M0579 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 without organ or systems involvement: Secondary | ICD-10-CM

## 2017-01-20 DIAGNOSIS — M19042 Primary osteoarthritis, left hand: Secondary | ICD-10-CM

## 2017-01-20 DIAGNOSIS — Z79899 Other long term (current) drug therapy: Secondary | ICD-10-CM | POA: Diagnosis not present

## 2017-01-20 DIAGNOSIS — Z8639 Personal history of other endocrine, nutritional and metabolic disease: Secondary | ICD-10-CM

## 2017-01-20 DIAGNOSIS — Z8679 Personal history of other diseases of the circulatory system: Secondary | ICD-10-CM | POA: Diagnosis not present

## 2017-01-20 DIAGNOSIS — K760 Fatty (change of) liver, not elsewhere classified: Secondary | ICD-10-CM | POA: Diagnosis not present

## 2017-01-20 DIAGNOSIS — M85851 Other specified disorders of bone density and structure, right thigh: Secondary | ICD-10-CM | POA: Diagnosis not present

## 2017-01-20 DIAGNOSIS — M7551 Bursitis of right shoulder: Secondary | ICD-10-CM | POA: Diagnosis not present

## 2017-01-20 DIAGNOSIS — Z87898 Personal history of other specified conditions: Secondary | ICD-10-CM

## 2017-01-20 DIAGNOSIS — M7062 Trochanteric bursitis, left hip: Secondary | ICD-10-CM | POA: Diagnosis not present

## 2017-01-20 DIAGNOSIS — M19041 Primary osteoarthritis, right hand: Secondary | ICD-10-CM | POA: Diagnosis not present

## 2017-01-20 DIAGNOSIS — E440 Moderate protein-calorie malnutrition: Secondary | ICD-10-CM | POA: Diagnosis not present

## 2017-01-20 LAB — CBC WITH DIFFERENTIAL/PLATELET
BASOS PCT: 1 %
Basophils Absolute: 52 cells/uL (ref 0–200)
EOS ABS: 520 {cells}/uL — AB (ref 15–500)
EOS PCT: 10 %
HCT: 38.3 % (ref 35.0–45.0)
Hemoglobin: 12.5 g/dL (ref 11.7–15.5)
LYMPHS PCT: 31 %
Lymphs Abs: 1612 cells/uL (ref 850–3900)
MCH: 31.2 pg (ref 27.0–33.0)
MCHC: 32.6 g/dL (ref 32.0–36.0)
MCV: 95.5 fL (ref 80.0–100.0)
MONOS PCT: 18 %
MPV: 12.8 fL — AB (ref 7.5–12.5)
Monocytes Absolute: 936 cells/uL (ref 200–950)
NEUTROS ABS: 2080 {cells}/uL (ref 1500–7800)
Neutrophils Relative %: 40 %
PLATELETS: 152 10*3/uL (ref 140–400)
RBC: 4.01 MIL/uL (ref 3.80–5.10)
RDW: 14.1 % (ref 11.0–15.0)
WBC: 5.2 10*3/uL (ref 3.8–10.8)

## 2017-01-20 LAB — HEMOGLOBIN A1C
HEMOGLOBIN A1C: 6.7 % — AB (ref <5.7)
Mean Plasma Glucose: 146 mg/dL

## 2017-01-20 LAB — MAGNESIUM: Magnesium: 1.2 mg/dL — ABNORMAL LOW (ref 1.5–2.5)

## 2017-01-20 MED ORDER — TRIAMCINOLONE ACETONIDE 40 MG/ML IJ SUSP
40.0000 mg | INTRAMUSCULAR | Status: AC | PRN
  Administered 2017-01-20: 40 mg via INTRA_ARTICULAR

## 2017-01-20 MED ORDER — LEFLUNOMIDE 10 MG PO TABS: 10.0000 mg | ORAL_TABLET | Freq: Every day | ORAL | 0 refills | Status: DC

## 2017-01-20 MED ORDER — LIDOCAINE HCL 1 % IJ SOLN
1.5000 mL | INTRAMUSCULAR | Status: AC | PRN
  Administered 2017-01-20: 1.5 mL

## 2017-01-20 NOTE — Patient Instructions (Signed)
=============================================================================== Iliotibial Band Syndrome Iliotibial band syndrome (ITBS) is a condition that often causes knee pain. It can also cause pain in the outside of your hip, thigh, and knee. The iliotibial band is a strip of tissue that runs from the outside of your hip and down your thigh to the outside of your knee. Repeatedly bending and straightening your knee can irritate the iliotibial band. What are the causes? This condition is caused by inflammation and irritation from the friction of the iliotibial band moving over the thigh bone (femur) when you repeatedly bend and straighten your knee. What increases the risk? This condition is more likely to develop in people who:  Frequently change elevation during their workouts.  Run very long distances.  Recently increased the length or intensity of their workouts.  Run downhill often, or just started running downhill.  Ride a bike very far or often.  You may also be at greater risk if you start a new workout routine without first warming up or if you have a job that requires you to bend, squat, or climb frequently. What are the signs or symptoms? Symptoms of this condition include:  Pain along the outside of your knee that may be worse with activity, especially running or going up and down stairs.  A "snapping" sensation over your knee.  Swelling on the outside of your knee.  Pain or a feeling of tightness in your hip.  How is this diagnosed? This condition is diagnosed based on your symptoms, medical history, and physical exam. You may also see a health care provider who specializes in reducing pain and increasing mobility (physical therapist). A physical therapist may do an exam to check your balance, movement, and way of walking or running (gait) to see whether the way you move could contribute to your injury. You may also have tests to measure your strength,  flexibility, and range of motion. How is this treated? Treatment for this condition includes:  Resting and limiting exercise.  Returning to activities gradually.  Doing range-of-motion and strengthening exercises (physical therapy) as told by your health care provider.  Including low-impact activities, such as swimming, in your exercise routine.  Follow these instructions at home:  If directed, apply ice to the injured area. ? Put ice in a plastic bag. ? Place a towel between your skin and the bag. ? Leave the ice on for 20 minutes, 2-3 times per day.  Return to your normal activities as told by your health care provider. Ask your health care provider what activities are safe for you.  Keep all follow-up visits with your health care provider. This is important. Contact a health care provider if:  Your pain does not improve or gets worse despite treatment. This information is not intended to replace advice given to you by your health care provider. Make sure you discuss any questions you have with your health care provider. Document Released: 01/29/2002 Document Revised: 09/10/2016 Document Reviewed: 09/10/2016 Elsevier Interactive Patient Education  2018 [employer].  ============================================ Shoulder Exercises Ask your health care provider which exercises are safe for you. Do exercises exactly as told by your health care provider and adjust them as directed. It is normal to feel mild stretching, pulling, tightness, or discomfort as you do these exercises, but you should stop right away if you feel sudden pain or your pain gets worse.Do not begin these exercises until told by your health care provider. RANGE OF MOTION EXERCISES These exercises warm  up your muscles and joints and improve the movement and flexibility of your shoulder. These exercises also help to relieve pain, numbness, and tingling. These exercises involve stretching your injured shoulder  directly. Exercise A: Pendulum  1. Stand near a wall or a surface that you can hold onto for balance. 2. Bend at the waist and let your left / right arm hang straight down. Use your other arm to support you. Keep your back straight and do not lock your knees. 3. Relax your left / right arm and shoulder muscles, and move your hips and your trunk so your left / right arm swings freely. Your arm should swing because of the motion of your body, not because you are using your arm or shoulder muscles. 4. Keep moving your body so your arm swings in the following directions, as told by your health care provider: ? Side to side. ? Forward and backward. ? In clockwise and counterclockwise circles. 5. Continue each motion for __________ seconds, or for as long as told by your health care provider. 6. Slowly return to the starting position. Repeat __________ times. Complete this exercise __________ times a day. Exercise B:Flexion, Standing  1. Stand and hold a broomstick, a cane, or a similar object. Place your hands a little more than shoulder-width apart on the object. Your left / right hand should be palm-up, and your other hand should be palm-down. 2. Keep your elbow straight and keep your shoulder muscles relaxed. Push the stick down with your healthy arm to raise your left / right arm in front of your body, and then over your head until you feel a stretch in your shoulder. ? Avoid shrugging your shoulder while you raise your arm. Keep your shoulder blade tucked down toward the middle of your back. 3. Hold for __________ seconds. 4. Slowly return to the starting position. Repeat __________ times. Complete this exercise __________ times a day. Exercise C: Abduction, Standing 1. Stand and hold a broomstick, a cane, or a similar object. Place your hands a little more than shoulder-width apart on the object. Your left / right hand should be palm-up, and your other hand should be palm-down. 2. While  keeping your elbow straight and your shoulder muscles relaxed, push the stick across your body toward your left / right side. Raise your left / right arm to the side of your body and then over your head until you feel a stretch in your shoulder. ? Do not raise your arm above shoulder height, unless your health care provider tells you to do that. ? Avoid shrugging your shoulder while you raise your arm. Keep your shoulder blade tucked down toward the middle of your back. 3. Hold for __________ seconds. 4. Slowly return to the starting position. Repeat __________ times. Complete this exercise __________ times a day. Exercise D:Internal Rotation  1. Place your left / right hand behind your back, palm-up. 2. Use your other hand to dangle an exercise band, a towel, or a similar object over your shoulder. Grasp the band with your left / right hand so you are holding onto both ends. 3. Gently pull up on the band until you feel a stretch in the front of your left / right shoulder. ? Avoid shrugging your shoulder while you raise your arm. Keep your shoulder blade tucked down toward the middle of your back. 4. Hold for __________ seconds. 5. Release the stretch by letting go of the band and lowering your hands. Repeat __________ times.  Complete this exercise __________ times a day. STRETCHING EXERCISES These exercises warm up your muscles and joints and improve the movement and flexibility of your shoulder. These exercises also help to relieve pain, numbness, and tingling. These exercises are done using your healthy shoulder to help stretch the muscles of your injured shoulder. Exercise E: Warehouse manager (External Rotation and Abduction)  1. Stand in a doorway with one of your feet slightly in front of the other. This is called a staggered stance. If you cannot reach your forearms to the door frame, stand facing a corner of a room. 2. Choose one of the following positions as told by your health care  provider: ? Place your hands and forearms on the door frame above your head. ? Place your hands and forearms on the door frame at the height of your head. ? Place your hands on the door frame at the height of your elbows. 3. Slowly move your weight onto your front foot until you feel a stretch across your chest and in the front of your shoulders. Keep your head and chest upright and keep your abdominal muscles tight. 4. Hold for __________ seconds. 5. To release the stretch, shift your weight to your back foot. Repeat __________ times. Complete this stretch __________ times a day. Exercise F:Extension, Standing 1. Stand and hold a broomstick, a cane, or a similar object behind your back. ? Your hands should be a little wider than shoulder-width apart. ? Your palms should face away from your back. 2. Keeping your elbows straight and keeping your shoulder muscles relaxed, move the stick away from your body until you feel a stretch in your shoulder. ? Avoid shrugging your shoulders while you move the stick. Keep your shoulder blade tucked down toward the middle of your back. 3. Hold for __________ seconds. 4. Slowly return to the starting position. Repeat __________ times. Complete this exercise __________ times a day. STRENGTHENING EXERCISES These exercises build strength and endurance in your shoulder. Endurance is the ability to use your muscles for a long time, even after they get tired. Exercise G:External Rotation  1. Sit in a stable chair without armrests. 2. Secure an exercise band at elbow height on your left / right side. 3. Place a soft object, such as a folded towel or a small pillow, between your left / right upper arm and your body to move your elbow a few inches away (about 10 cm) from your side. 4. Hold the end of the band so it is tight and there is no slack. 5. Keeping your elbow pressed against the soft object, move your left / right forearm out, away from your abdomen.  Keep your body steady so only your forearm moves. 6. Hold for __________ seconds. 7. Slowly return to the starting position. Repeat __________ times. Complete this exercise __________ times a day. Exercise H:Shoulder Abduction  1. Sit in a stable chair without armrests, or stand. 2. Hold a __________ weight in your left / right hand, or hold an exercise band with both hands. 3. Start with your arms straight down and your left / right palm facing in, toward your body. 4. Slowly lift your left / right hand out to your side. Do not lift your hand above shoulder height unless your health care provider tells you that this is safe. ? Keep your arms straight. ? Avoid shrugging your shoulder while you do this movement. Keep your shoulder blade tucked down toward the middle of your back. 5.  Hold for __________ seconds. 6. Slowly lower your arm, and return to the starting position. Repeat __________ times. Complete this exercise __________ times a day. Exercise I:Shoulder Extension 1. Sit in a stable chair without armrests, or stand. 2. Secure an exercise band to a stable object in front of you where it is at shoulder height. 3. Hold one end of the exercise band in each hand. Your palms should face each other. 4. Straighten your elbows and lift your hands up to shoulder height. 5. Step back, away from the secured end of the exercise band, until the band is tight and there is no slack. 6. Squeeze your shoulder blades together as you pull your hands down to the sides of your thighs. Stop when your hands are straight down by your sides. Do not let your hands go behind your body. 7. Hold for __________ seconds. 8. Slowly return to the starting position. Repeat __________ times. Complete this exercise __________ times a day. Exercise J:Standing Shoulder Row 1. Sit in a stable chair without armrests, or stand. 2. Secure an exercise band to a stable object in front of you so it is at waist  height. 3. Hold one end of the exercise band in each hand. Your palms should be in a thumbs-up position. 4. Bend each of your elbows to an "L" shape (about 90 degrees) and keep your upper arms at your sides. 5. Step back until the band is tight and there is no slack. 6. Slowly pull your elbows back behind you. 7. Hold for __________ seconds. 8. Slowly return to the starting position. Repeat __________ times. Complete this exercise __________ times a day. Exercise K:Shoulder Press-Ups  1. Sit in a stable chair that has armrests. Sit upright, with your feet flat on the floor. 2. Put your hands on the armrests so your elbows are bent and your fingers are pointing forward. Your hands should be about even with the sides of your body. 3. Push down on the armrests and use your arms to lift yourself off of the chair. Straighten your elbows and lift yourself up as much as you comfortably can. ? Move your shoulder blades down, and avoid letting your shoulders move up toward your ears. ? Keep your feet on the ground. As you get stronger, your feet should support less of your body weight as you lift yourself up. 4. Hold for __________ seconds. 5. Slowly lower yourself back into the chair. Repeat __________ times. Complete this exercise __________ times a day. Exercise L: Wall Push-Ups  1. Stand so you are facing a stable wall. Your feet should be about one arm-length away from the wall. 2. Lean forward and place your palms on the wall at shoulder height. 3. Keep your feet flat on the floor as you bend your elbows and lean forward toward the wall. 4. Hold for __________ seconds. 5. Straighten your elbows to push yourself back to the starting position. Repeat __________ times. Complete this exercise __________ times a day. This information is not intended to replace advice given to you by your health care provider. Make sure you discuss any questions you have with your health care provider. Document  Released: 06/23/2005 Document Revised: 05/03/2016 Document Reviewed: 04/20/2015 Elsevier Interactive Patient Education  2018 [employer].

## 2017-01-21 ENCOUNTER — Other Ambulatory Visit: Payer: Self-pay

## 2017-01-24 ENCOUNTER — Telehealth: Payer: Self-pay

## 2017-01-24 ENCOUNTER — Telehealth: Payer: Self-pay | Admitting: Internal Medicine

## 2017-01-24 ENCOUNTER — Other Ambulatory Visit: Payer: Self-pay

## 2017-01-24 MED ORDER — MAGNESIUM OXIDE 400 MG PO TABS: 400.0000 mg | ORAL_TABLET | Freq: Two times a day (BID) | ORAL | 3 refills | Status: DC

## 2017-01-24 NOTE — Telephone Encounter (Signed)
Pt had lab work done 5/30 and was suppose to increase her magnesium. I spoke with pharmacy and the current strength   mag64 535 (64 mg) mg TBCR is no longer available.  Can you change the strength on her RX please

## 2017-01-24 NOTE — Telephone Encounter (Signed)
Pt asking when she is due for a follow up CT chest- pt last seen 03/2014, was advised to have a follow-up 3 months after that office visit for ct chest.    Scheduled rov to re-establish and discuss follow up.  Nothing further needed.

## 2017-01-24 NOTE — Telephone Encounter (Signed)
Rx sent to pharmacy pt is aware

## 2017-01-24 NOTE — Telephone Encounter (Signed)
Magnesium oxide 400mg , take 2 tablets daily

## 2017-01-26 DIAGNOSIS — M8589 Other specified disorders of bone density and structure, multiple sites: Secondary | ICD-10-CM | POA: Diagnosis not present

## 2017-01-27 ENCOUNTER — Other Ambulatory Visit: Payer: Self-pay | Admitting: Obstetrics and Gynecology

## 2017-01-27 DIAGNOSIS — Z1231 Encounter for screening mammogram for malignant neoplasm of breast: Secondary | ICD-10-CM | POA: Diagnosis not present

## 2017-01-27 DIAGNOSIS — Z6837 Body mass index (BMI) 37.0-37.9, adult: Secondary | ICD-10-CM | POA: Diagnosis not present

## 2017-01-27 DIAGNOSIS — Z01419 Encounter for gynecological examination (general) (routine) without abnormal findings: Secondary | ICD-10-CM | POA: Diagnosis not present

## 2017-01-27 DIAGNOSIS — Z79890 Hormone replacement therapy (postmenopausal): Secondary | ICD-10-CM | POA: Diagnosis not present

## 2017-01-27 LAB — HM MAMMOGRAPHY

## 2017-02-01 LAB — HM PAP SMEAR

## 2017-02-01 LAB — CYTOLOGY - PAP

## 2017-02-11 ENCOUNTER — Telehealth: Payer: Self-pay | Admitting: Physician Assistant

## 2017-02-11 NOTE — Telephone Encounter (Signed)
[phone] [facility]  Needs her symvastatin called into this pharmacy if possible cannot get it through mail order any longer because she switched pharmacies

## 2017-02-14 MED ORDER — ATORVASTATIN CALCIUM 10 MG PO TABS: ORAL_TABLET | ORAL | 1 refills | Status: DC

## 2017-02-14 NOTE — Telephone Encounter (Signed)
Pt is not on simvastatin. Called to verify the medication pt need and it is the lipitor. Will send in refill for lipitor

## 2017-02-22 ENCOUNTER — Ambulatory Visit (INDEPENDENT_AMBULATORY_CARE_PROVIDER_SITE_OTHER): Payer: PPO | Admitting: Internal Medicine

## 2017-02-22 ENCOUNTER — Encounter: Payer: Self-pay | Admitting: Internal Medicine

## 2017-02-22 VITALS — BP 126/74 | HR 66 | Ht 59.0 in | Wt 184.0 lb

## 2017-02-22 DIAGNOSIS — R918 Other nonspecific abnormal finding of lung field: Secondary | ICD-10-CM

## 2017-02-22 NOTE — Progress Notes (Signed)
Subjective:   Patient ID: [name], female    DOB: 10-Apr-1946    MRN: [id number]    Brief patient profile:  89 yowf quit smoking 03/16/14 with recurrent epigastric pain requiring multiple abd ct's most recently 10/16/13 and 03/18/14 with incidental discovery of MPN's so referred to pulmonary clinic by [name]    .History of Present Illness  04/04/2014 1st [facility] office visit/ [name]  Chief Complaint  Patient presents with  . Pulmonary Consult    Referred per Dr Dena Billet for eval of pulmonary nodules. Pt states nodules were found incidentally on ct abd. No respiratory co's.   no cough, no sob, no cp. Has longstanding RA well controlled, no skin nodules   W/u:  - See CT chest 03/17/14 > not viz on abd CT from 10/16/13 as did not go high enough on lung slices >>>in tickle file for recall 06/17/14  - Spirometry 04/04/14 wnl sp smoking cessation 02/2014 - 06/26/2014 Right middle lobe 1.0 cm pulmonary nodule is similar over the past 3 months. This remains indeterminate.   - CT 12/25/14  Pulmonary nodules are stable from 03/17/2014 - CT chest 02/25/16 Bilateral pulmonary nodules are all stable since 03/17/2014 and are considered benign> no further f/u  2. Aortic atherosclerosis. 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 3. Mixed attenuation 1.6 cm left thyroid lobe nodule is stable. If not previously performed, recommend correlation with thyroid Ultrasound.> see thyroid nodule - u/s thyroid 03/04/16 > Pos L nodules > both Bx 01/24/159 > BENIGN FOLLICULAR NODULE   1/0/9323  f/u ov/[name] re: f/u mpns Chief Complaint  Patient presents with  . Follow-up    Doing well and no new co's today.    despite wt gain no sob/ RA better on enbrel shots per [place]  No obvious day to day or daytime variability or assoc chronic cough or cp or chest tightness, subjective wheeze or overt sinus or hb symptoms. No unusual exp hx or h/o childhood pna/ asthma or knowledge of premature  birth.  Sleeping ok without nocturnal  or early am exacerbation  of respiratory  c/o's or need for noct saba. Also denies any obvious fluctuation of symptoms with weather or environmental changes or other aggravating or alleviating factors except as outlined above   Current Medications, Allergies, Complete Past Medical History, Past Surgical History, Family History, and Social History were reviewed in [employer] record.  ROS  The following are not active complaints unless bolded sore throat, dysphagia, dental problems, itching, sneezing,  nasal congestion or excess/ purulent secretions, ear ache,   fever, chills, sweats, unintended wt loss, classically pleuritic or exertional cp, hemoptysis,  orthopnea pnd or leg swelling, presyncope, palpitations, abdominal pain, anorexia, nausea, vomiting, diarrhea  or change in bowel or bladder habits, change in stools or urine, dysuria,hematuria,  rash, arthralgias, visual complaints, headache, numbness, weakness or ataxia or problems with walking or coordination,  change in mood/affect or memory.                 Objective:   Physical Exam    amb wf nad   02/22/2017        183   04/04/14 143 lb (64.864 kg)  03/27/14 148 lb (67.132 kg)  03/19/14 145 lb 11.2 oz (66.089 kg)      Vital signs reviewed:    Note on arrival 02 sats  95% on RA    HEENT: nl dentition, turbinates, and orophanx. Nl external ear canals without  cough reflex   NECK :  without JVD/Nodes/TM/ nl carotid upstrokes bilaterally   LUNGS: no acc muscle use, clear to A and P bilaterally without cough on insp or exp maneuvers   CV:  RRR  no s3 or murmur or increase in P2, no edema   ABD:  soft and nontender with nl excursion in the supine position. No bruits or organomegaly, bowel sounds nl  MS:  warm without deformities, calf tenderness, cyanosis or clubbing  SKIN: warm and dry without lesions    NEURO:  alert, approp, no deficits     I personally  reviewed images and agree with radiology impression as follows:  CXR:   06/03/16  Cardiac shadow is within normal limits. The lungs are well aerated bilaterally. No focal infiltrate or sizable effusion is seen. Mild degenerative changes of thoracic spine are noted.    Assessment & Plan:

## 2017-02-22 NOTE — Assessment & Plan Note (Signed)
-   See CT chest 03/17/14 > not viz on abd CT from 10/16/13 as did not go high enough on lung slices >>>in tickle file for recall 06/17/14  - Spirometry 04/04/14 wnl sp smoking cessation 02/2014 - 06/26/2014 Right middle lobe 1.0 cm pulmonary nodule is similar over the past 3 months. This remains indeterminate.   - CT 12/25/14  Pulmonary nodules are stable from 03/17/2014 - CT chest 02/25/16 Bilateral pulmonary nodules are all stable since 03/17/2014 and are considered benign> no further f/u       I had an extended final summary discussion with the patient reviewing all relevant studies completed to date and  lasting 15 to 20 minutes of a 25 minute visit on the following issues:    CT results reviewed with pt >>> Too small for PET or bx, not suspicious enough for excisional bx > really only option for now is follow the Fleischner society guidelines as rec by radiology = no dedicated f/u indicated, esp in pt with RA with likely high false pos rate.  She is eligible for low dose screening ct x 12 more years but declined entering/ committing to longterm f/u so no need for this either  Discussed in detail all the  indications, usual  risks and alternatives  relative to the benefits with patient who agrees to proceed   f/u as outlined = prn symptoms or new findings on plain cxr

## 2017-02-22 NOTE — Patient Instructions (Signed)
No regular pulmonary follow up is needed unless directed by your rheumatologist or primary care provider - I will let them know also   Work on getting your weigth turned around if possible - good luck!

## 2017-02-25 ENCOUNTER — Other Ambulatory Visit: Payer: Self-pay

## 2017-02-25 MED ORDER — FOLIC ACID 1 MG PO TABS: 1.0000 mg | ORAL_TABLET | Freq: Two times a day (BID) | ORAL | 0 refills | Status: DC

## 2017-02-25 NOTE — Telephone Encounter (Signed)
Refill appropriate 

## 2017-02-26 ENCOUNTER — Other Ambulatory Visit: Payer: Self-pay | Admitting: Rheumatology

## 2017-02-28 NOTE — Telephone Encounter (Signed)
ok. Please recheck labs in 2 months

## 2017-02-28 NOTE — Telephone Encounter (Signed)
Last Visit: 01/20/17 Next Visit: 06/22/17 Labs: 01/20/17  CBC WNL CMP Creat 1.06 Previous 0.77 GFR 53 Precious 77 Elevated glucose  Okay to refill Arava?

## 2017-03-04 ENCOUNTER — Other Ambulatory Visit: Payer: Self-pay

## 2017-03-04 MED ORDER — LOSARTAN POTASSIUM-HCTZ 100-12.5 MG PO TABS: 1.0000 | ORAL_TABLET | Freq: Every day | ORAL | 1 refills | Status: DC

## 2017-03-04 NOTE — Telephone Encounter (Signed)
Rx filled as requested.

## 2017-04-16 ENCOUNTER — Other Ambulatory Visit: Payer: Self-pay | Admitting: Physician Assistant

## 2017-04-18 NOTE — Telephone Encounter (Signed)
Refill appropriate 

## 2017-05-16 ENCOUNTER — Other Ambulatory Visit: Payer: Self-pay | Admitting: Rheumatology

## 2017-05-16 NOTE — Telephone Encounter (Addendum)
Last Visit: 01/20/17 Next Visit: 06/22/17 Labs: 01/20/17  CBC WNL CMP Creat 1.06 Previous 0.77 GFR 53 Previous 77 Elevated glucose  Patient reminded she is due for labs and will be in 05/17/17   Okay to refill Arava?

## 2017-05-17 ENCOUNTER — Other Ambulatory Visit: Payer: Self-pay | Admitting: *Deleted

## 2017-05-17 DIAGNOSIS — Z79899 Other long term (current) drug therapy: Secondary | ICD-10-CM | POA: Diagnosis not present

## 2017-05-17 LAB — COMPLETE METABOLIC PANEL WITH GFR
AG RATIO: 1.4 (calc) (ref 1.0–2.5)
ALBUMIN MSPROF: 3.6 g/dL (ref 3.6–5.1)
ALKALINE PHOSPHATASE (APISO): 102 U/L (ref 33–130)
ALT: 23 U/L (ref 6–29)
AST: 28 U/L (ref 10–35)
BILIRUBIN TOTAL: 0.4 mg/dL (ref 0.2–1.2)
BUN: 17 mg/dL (ref 7–25)
CHLORIDE: 100 mmol/L (ref 98–110)
CO2: 27 mmol/L (ref 20–32)
Calcium: 9 mg/dL (ref 8.6–10.4)
Creat: 0.83 mg/dL (ref 0.60–0.93)
GFR, EST AFRICAN AMERICAN: 82 mL/min/{1.73_m2} (ref >=60)
GFR, Est Non African American: 70 mL/min/{1.73_m2} (ref >=60)
GLUCOSE: 182 mg/dL — AB (ref 65–99)
Globulin: 2.6 g/dL (calc) (ref 1.9–3.7)
POTASSIUM: 3.6 mmol/L (ref 3.5–5.3)
SODIUM: 139 mmol/L (ref 135–146)
TOTAL PROTEIN: 6.2 g/dL (ref 6.1–8.1)

## 2017-05-17 LAB — CBC WITH DIFFERENTIAL/PLATELET
BASOS ABS: 41 {cells}/uL (ref 0–200)
BASOS PCT: 0.6 %
EOS ABS: 421 {cells}/uL (ref 15–500)
Eosinophils Relative: 6.1 %
HCT: 37.9 % (ref 35.0–45.0)
HEMOGLOBIN: 12.7 g/dL (ref 11.7–15.5)
Lymphs Abs: 2153 cells/uL (ref 850–3900)
MCH: 31.2 pg (ref 27.0–33.0)
MCHC: 33.5 g/dL (ref 32.0–36.0)
MCV: 93.1 fL (ref 80.0–100.0)
MPV: 13 fL — AB (ref 7.5–12.5)
Monocytes Relative: 15 %
NEUTROS ABS: 3250 {cells}/uL (ref 1500–7800)
Neutrophils Relative %: 47.1 %
Platelets: 150 10*3/uL (ref 140–400)
RBC: 4.07 10*6/uL (ref 3.80–5.10)
RDW: 12.9 % (ref 11.0–15.0)
TOTAL LYMPHOCYTE: 31.2 %
WBC: 6.9 10*3/uL (ref 3.8–10.8)
WBCMIX: 1035 {cells}/uL — AB (ref 200–950)

## 2017-05-17 NOTE — Telephone Encounter (Signed)
Patient updated labs 05/17/17.  Okay to refill per Dr. Estanislado Pandy

## 2017-05-20 LAB — QUANTIFERON TB GOLD ASSAY (BLOOD)
QUANTIFERON TB AG MINUS NIL: 0 [IU]/mL
QUANTIFERON(R)-TB GOLD: NEGATIVE
Quantiferon Nil Value: 0.03 IU/mL

## 2017-05-20 NOTE — Progress Notes (Signed)
TB gold negative

## 2017-05-24 ENCOUNTER — Telehealth: Payer: Self-pay

## 2017-05-24 NOTE — Telephone Encounter (Signed)
Called [employer] foundation to check the process of the 9643 renewal application. Spoke to a representative who states that each patient will be required to submit a full application along with a prior authorization letter. Financial documents are optional, however, may be required later on. Application can be faxed starting on 05/24/2017 for processing. Anything before will be automatically denied. We can fax the application to the foundation for the patient in the clinic.   Called patient to inform. Her next appointment is scheduled for 10/31. She will complete and sign the application for submission then. Will update once we receive a response.   Giovany Cosby, [place], CPhT 1:53 PM

## 2017-06-01 ENCOUNTER — Other Ambulatory Visit: Payer: Self-pay | Admitting: Family Medicine

## 2017-06-02 ENCOUNTER — Other Ambulatory Visit: Payer: Self-pay | Admitting: Physician Assistant

## 2017-06-02 NOTE — Telephone Encounter (Signed)
Medication refilled per protocol. 

## 2017-06-12 NOTE — Progress Notes (Signed)
Office Visit Note  Patient: [name]             Date of Birth: 1945-09-18           MRN: [id number]             PCP: Rennis Golden Referring: Orlena Sheldon, PA-C Visit Date: 06/22/2017 Occupation: @GUAROCC @    Subjective:  Medication management   History of Present Illness: [name] is a 71 y.o. female with history of sero positive rheumatoid arthritis. She states she is doing much better on combination of Enbrel and Arava. She denies any recent episode of joint swelling. She continues to have some stiffness. Her bilateral knee replacement are doing well. She has occasional swelling in her hands.  Activities of Daily Living:  Patient reports morning stiffness for 2 minutes.   Patient Denies nocturnal pain.  Difficulty dressing/grooming: Denies Difficulty climbing stairs: Denies Difficulty getting out of chair: Denies Difficulty using hands for taps, buttons, cutlery, and/or writing: Denies   Review of Systems  Constitutional: Positive for fatigue. Negative for night sweats, weight gain, weight loss and weakness.  HENT: Positive for mouth dryness. Negative for mouth sores, trouble swallowing, trouble swallowing and nose dryness.   Eyes: Negative for pain, redness, visual disturbance and dryness.  Respiratory: Negative for cough, shortness of breath and difficulty breathing.   Cardiovascular: Negative for chest pain, palpitations, hypertension, irregular heartbeat and swelling in legs/feet.  Gastrointestinal: Negative for blood in stool, constipation and diarrhea.  Endocrine: Negative for increased urination.  Genitourinary: Negative for vaginal dryness.  Musculoskeletal: Positive for morning stiffness. Negative for arthralgias, joint pain, joint swelling, myalgias, muscle weakness, muscle tenderness and myalgias.  Skin: Negative for color change, rash, hair loss, skin tightness, ulcers and sensitivity to sunlight.  Allergic/Immunologic: Negative for susceptible to  infections.  Neurological: Negative for dizziness, memory loss and night sweats.  Hematological: Negative for swollen glands.  Psychiatric/Behavioral: Negative for depressed mood and sleep disturbance. The patient is not nervous/anxious.     PMFS History:  Patient Active Problem List   Diagnosis Date Noted  . History of total bilateral knee replacement 06/22/2017  . History of hypertension 08/20/2016  . History of diabetes mellitus 08/20/2016  . High risk medication use 08/20/2016  . Primary osteoarthritis of both hands 08/20/2016  . Hypomagnesemia 07/21/2016  . Left thyroid nodule 03/05/2016  . Diabetic eye exam ([place]) 10/15/2014  . Multiple pulmonary nodules 04/04/2014  . Malnutrition of moderate degree ([place]) 03/18/2014  . Sepsis ([place]) 03/16/2014  . Anemia 11/21/2013  . 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 Bursitis of right shoulder   . Diabetes mellitus type 2, uncomplicated ([place]) 35/36/1443  . GERD (gastroesophageal reflux disease)   . Morbid obesity due to excess calories ([place]) 12/27/2012  . 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 without organ or systems involvement ([place])   . Essential hypertension, benign 10/14/2012  . Hyperlipidemia   . Hormone replacement therapy (postmenopausal)   . Fatty liver disease, nonalcoholic   . Osteoarthritis of right knee 08/05/2012    Past Medical History:  Diagnosis Date  . Arthritis    osteoarthritis  . Bursitis of right shoulder   . Chronic cholecystitis with calculus 05/04/2013  . 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 Diabetes type 2, controlled ([place]) 05/04/2013  . Fatty liver disease, nonalcoholic 1540   hospitalized, MRCP dx fatty liver  . GERD (gastroesophageal reflux disease)   .  Hormone replacement therapy (postmenopausal)   . Hyperlipidemia   . Hypertension   . Normal cardiac stress test    pt can't remember when or where  . Obesity, unspecified 12/27/2012  . Rheumatoid  arthritis(714.0) 09/28/2012  . 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 Unspecified essential hypertension 05/04/2013    Family History  Problem Relation Age of Onset  . Cancer Mother        breast  . Heart attack Mother   . Stroke Father   . Diabetes Other   . Heart disease Other   . Diabetes Sister    Past Surgical History:  Procedure Laterality Date  . ABDOMINAL HYSTERECTOMY    . CHOLECYSTECTOMY N/A 05/04/2013   Procedure: LAPAROSCOPIC CHOLECYSTECTOMY WITH INTRAOPERATIVE CHOLANGIOGRAM;  Surgeon: Haywood Lasso, MD;  Location: [facility];  Service: General;  Laterality: N/A;  . ERCP N/A 05/02/2013   Procedure: ENDOSCOPIC RETROGRADE CHOLANGIOPANCREATOGRAPHY (ERCP);  Surgeon: Beryle Beams, MD;  Location: [facility];  Service: Endoscopy;  Laterality: N/A;  . JOINT REPLACEMENT     left knee  . NASAL SINUS SURGERY    . SPHINCTEROTOMY  05/02/2013   Procedure: SPHINCTEROTOMY;  Surgeon: Beryle Beams, MD;  Location: [facility];  Service: Endoscopy;;  . TONSILLECTOMY    . TOTAL KNEE ARTHROPLASTY  08/01/2012   Procedure: TOTAL KNEE ARTHROPLASTY;  Surgeon: Meredith Pel, MD;  Location: [facility];  Service: Orthopedics;  Laterality: Right;  Right total knee arthroplasty  . TOTAL KNEE ARTHROPLASTY  08/01/2012   RIGHT  KNEE   Social History   Social History Narrative  . No narrative on file     Objective: Vital Signs: BP (!) 153/75 (BP Location: Left Arm, Patient Position: Sitting, Cuff Size: Normal)   Pulse 76   Resp 17   Ht 4\' 11"  (1.499 m)   Wt 194 lb (88 kg)   BMI 39.18 kg/m    Physical Exam  Constitutional: She is oriented to person, place, and time. She appears well-developed and well-nourished.  HENT:  Head: Normocephalic and atraumatic.  Eyes: Conjunctivae and EOM are normal.  Neck: Normal range of motion.  Cardiovascular: Normal rate, regular rhythm, normal heart sounds and intact distal pulses.   Pulmonary/Chest: Effort normal and breath sounds normal.    Abdominal: Soft. Bowel sounds are normal.  Lymphadenopathy:    She has no cervical adenopathy.  Neurological: She is alert and oriented to person, place, and time.  Skin: Skin is warm and dry. Capillary refill takes less than 2 seconds.  Psychiatric: She has a normal mood and affect. Her behavior is normal.  Nursing note and vitals reviewed.    Musculoskeletal Exam: C-spine and thoracic lumbar spine limited range of motion. Shoulder joints elbow joints wrist joints are good range of motion. She had no synovitis over MCPs PIP joints. Hip joints knee joints ankles MTPs PIPs with good range of motion. She has bilateral total knee replacement which appears to be doing well.  CDAI Exam: CDAI Homunculus Exam:   Joint Counts:  CDAI Tender Joint count: 0 CDAI Swollen Joint count: 0  Global Assessments:  Patient Global Assessment: 3 Provider Global Assessment: 3  CDAI Calculated Score: 6    Investigation: No additional findings.TB Gold: Negative in 04/2017 CBC Latest Ref Rng & Units 05/17/2017 01/20/2017 11/05/2016  WBC 3.8 - 10.8 Thousand/uL 6.9 5.2 5.2  Hemoglobin 11.7 - 15.5 g/dL 12.7 12.5 12.3  Hematocrit 35.0 - 45.0 % 37.9 38.3 37.2  Platelets 140 - 400 Thousand/uL  150 152 158   CMP Latest Ref Rng & Units 05/17/2017 01/19/2017 11/05/2016  Glucose 65 - 99 mg/dL 182(H) 136(H) 143(H)  BUN 7 - 25 mg/dL 17 20 21   Creatinine 0.60 - 0.93 mg/dL 0.83 1.06(H) 0.77  Sodium 135 - 146 mmol/L 139 142 140  Potassium 3.5 - 5.3 mmol/L 3.6 3.6 3.9  Chloride 98 - 110 mmol/L 100 103 105  CO2 20 - 32 mmol/L 27 25 25   Calcium 8.6 - 10.4 mg/dL 9.0 9.8 9.1  Total Protein 6.1 - 8.1 g/dL 6.2 6.3 6.6  Total Bilirubin 0.2 - 1.2 mg/dL 0.4 0.4 0.4  Alkaline Phos 33 - 130 U/L - 81 73  AST 10 - 35 U/L 28 24 23   ALT 6 - 29 U/L 23 18 19     Imaging: Xr Foot 2 Views Left  Result Date: 06/22/2017  first MTP, all PIP and DIP narrowing was noted. No intertarsal joint space narrowing was noted. No erosive  changes were noted. A small inferior calcaneal spur was noted. These findings were consistent with osteoarthritis.  Xr Foot 2 Views Right  Result Date: 06/22/2017 Right first MTP, all PIP and DIP narrowing was noted. No intertarsal joint space narrowing was noted. No erosive changes were noted. A small inferior calcaneal spur was noted. These findings were consistent with osteoarthritis.  Xr Hand 2 View Left  Result Date: 06/22/2017 Severe CMC joint narrowing and subluxation noted. Second and third MCP joint narrowing noted periods juxta articular osteopenia was noted. PIP/DIP joint narrowing was noted. No radiocarpal or intercarpal joint space narrowing was noted. Impression: These findings are consistent with osteoarthritis and rheumatoid arthritis overlap.  Xr Hand 2 View Right  Result Date: 06/22/2017 Severe CMC joint narrowing and subluxation was noted. First second and third MCP joint narrowing with juxta articular osteopenia was noted. PIP/DIP narrowing was noted. An erosive change in the second finger DIP joint was noted. No significant radiocarpal ulnar carpal narrowing was noted. Impression these findings are consistent with rheumatoid arthritis and osteoarthritis overlap.   Speciality Comments: No specialty comments available.    Procedures:  No procedures performed Allergies: Monopril [fosinopril] and Norvasc [amlodipine besylate]   Assessment / Plan:     Visit Diagnoses: 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 without organ or systems involvement ([place]) - +RF, +CCP, -ANA -she has no synovitis on examination. She's doing much better on an [place] and Arava combination. Which she's been having intermittent discomfort in her hands and feet. I will obtain following x-rays. Plan: XR Hand 2 View Right, XR Hand 2 View Left, XR Foot 2 Views Right, XR Foot 2 Views Left. X-rays were consistent with osteoarthritic syndrome rheumatoid arthritis overlap. There was no  progression from her previous x-rays.  High risk medication use - Enbrel 50 mg subcutaneous every week(06/25/2016), Arava 10 mg by mouth daily. Her labs have been stable. We will continue to monitor her labs every 3 months. Need for yearly skin examination was discussed.  Primary osteoarthritis of both hands: Some stiffness  History of total bilateral knee replacement - Right= 2013. Left=2003: Doing well  History of hypertension: Her blood pressure is elevated today monitoring blood pressure was discussed.  History of diabetes mellitus  Class II obesity: Weight loss diet and exercise was discussed at length. Association of heart disease with rheumatoid arthritis was emphasized.  Multiple pulmonary nodules  Gastroesophageal reflux disease, esophagitis presence not specified  History of hyperlipidemia  History of cholecystectomy   Association of heart disease  with rheumatoid arthritis was discussed. Need to monitor blood pressure, cholesterol, and to exercise 30-60 minutes on daily basis was discussed. Poor dental hygiene can be a predisposing factor for rheumatoid arthritis. Good dental hygiene was discussed. Orders: Orders Placed This Encounter  Procedures  . XR Hand 2 View Right  . XR Hand 2 View Left  . XR Foot 2 Views Right  . XR Foot 2 Views Left   No orders of the defined types were placed in this encounter.   Face-to-face time spent with patient was 30 minutes. Greater than 50% of time was spent in counseling and coordination of care.  Follow-Up Instructions: Return in about 5 months (around 11/20/2017) for Rheumatoid arthritis.   Bo Merino, MD  Note - This record has been created using Editor, commissioning.  Chart creation errors have been sought, but may not always  have been located. Such creation errors do not reflect on  the standard of medical care.

## 2017-06-16 ENCOUNTER — Encounter: Payer: Self-pay | Admitting: Physician Assistant

## 2017-06-16 ENCOUNTER — Ambulatory Visit (INDEPENDENT_AMBULATORY_CARE_PROVIDER_SITE_OTHER): Payer: PPO | Admitting: Physician Assistant

## 2017-06-16 VITALS — BP 130/80 | HR 77 | Temp 97.9°F | Resp 16 | Ht 59.0 in | Wt 188.4 lb

## 2017-06-16 DIAGNOSIS — D2372 Other benign neoplasm of skin of left lower limb, including hip: Secondary | ICD-10-CM

## 2017-06-16 DIAGNOSIS — Z23 Encounter for immunization: Secondary | ICD-10-CM

## 2017-06-16 DIAGNOSIS — D492 Neoplasm of unspecified behavior of bone, soft tissue, and skin: Secondary | ICD-10-CM | POA: Diagnosis not present

## 2017-06-16 NOTE — Progress Notes (Signed)
Patient ID: [name] MRN: [id number], DOB: February 12, 1946, 71 y.o. Date of Encounter: 06/16/2017, 8:39 AM    Chief Complaint:  Chief Complaint  Patient presents with  . spot on head     HPI: 71 y.o. year old female presents with above.   States that she has recently noticed this spot on the top of her head. Says that at first she thought it was just a little bump or dry skin that it would itch a little bit. Then about 2 weeks ago she noticed that it looked dark and was bigger so then decided to come in to get it evaluated. I noted to her that I could see that it would be hard for her to see it herself with being up on the top of her head but asked if anyone else had mentioned it. Even other people had said that it just looked like a bump--- up until just the past 1-2 weeks.  She then says that she wanted to show me this area on her left leg that she had thought was just a mosquito bite. Says that she thought was just a bug bite but it has been present for 3 or 4 weeks and isn't resolving.  Has noticed no other abnormalities on her skin.  Has no other concerns to address today.     Home Meds:   Outpatient Medications Prior to Visit  Medication Sig Dispense Refill  . amLODipine (NORVASC) 5 MG tablet TAKE 1 TABLET DAILY 90 tablet 1  . aspirin EC 81 MG tablet Take 81 mg by mouth daily.    Marland Kitchen atorvastatin (LIPITOR) 10 MG tablet TAKE 1 TABLET DAILY (NEW DOSE) 90 tablet 1  . Calcium Carbonate-Vitamin D (CALCIUM 600 + D PO) Take 1 tablet by mouth 2 (two) times daily.    . cloNIDine (CATAPRES) 0.3 MG tablet TAKE 1 TABLET TWICE A DAY 180 tablet 1  . cyanocobalamin 500 MCG tablet Take 1 tablet (500 mcg total) by mouth daily. 30 tablet 0  . diclofenac sodium (VOLTAREN) 1 % GEL Apply 3 g topically daily as needed (pain). To 3 large joints    . ENBREL SURECLICK 50 MG/ML injection INJECT 50 MG (0.98 ML) UNDER THE SKIN ONCE A WEEK 3.92 mL 2  . estradiol (ESTRACE) 0.5 MG tablet Take 0.5 mg by  mouth 2 (two) times daily.    . fish oil-omega-3 fatty acids 1000 MG capsule Take 1 g by mouth at bedtime.     . folic acid (FOLVITE) 1 MG tablet TAKE 1 TABLET BY MOUTH TWICE A DAY 90 tablet 3  . leflunomide (ARAVA) 10 MG tablet TAKE 1 TABLET BY MOUTH EVERY DAY 90 tablet 0  . losartan-hydrochlorothiazide (HYZAAR) 100-12.5 MG tablet Take 1 tablet by mouth daily. 90 tablet 1  . magnesium oxide (MAG-OX) 400 MG tablet TAKE 1 TABLET BY MOUTH TWICE A DAY 60 tablet 3  . metoprolol tartrate (LOPRESSOR) 25 MG tablet TAKE ONE-HALF (1/2) TABLET TWICE A DAY 90 tablet 1  . Multiple Vitamins-Minerals (MULTIVITAMINS THER. W/MINERALS) TABS Take 1 tablet by mouth daily.    Marland Kitchen omeprazole (PRILOSEC) 20 MG capsule Take 1 capsule (20 mg total) by mouth daily. 90 capsule 3  . leflunomide (ARAVA) 10 MG tablet Take 1 tablet (10 mg total) by mouth daily. 90 tablet 0   No facility-administered medications prior to visit.     Allergies:  Allergies  Allergen Reactions  . Monopril [Fosinopril] Cough  . Norvasc [Amlodipine Besylate] Other (  See Comments)    10 mg dose causes LE edema.  Tolerates 5mg  dose.      Review of Systems: See HPI for pertinent ROS. All other ROS negative.    Physical Exam: Blood pressure 130/80, pulse 77, temperature 97.9 F (36.6 C), temperature source Oral, resp. rate 16, height 4\' 11"  (1.499 m), weight 85.5 kg (188 lb 6.4 oz), SpO2 97 %., Body mass index is 38.05 kg/m. General:  Obese WF. Appears in no acute distress. Neck: Supple. No thyromegaly. No lymphadenopathy. Lungs: Clear bilaterally to auscultation without wheezes, rales, or rhonchi. Breathing is unlabored. Heart: Regular rhythm. No murmurs, rubs, or gallops. Msk:  Strength and tone normal for age. Skin:  Her hair is extremely thin so this lesion on the top of her right scalp is very visible. It is an oval but asymmetrical shaped lesion. It is raised approximate 5 mm. It is approximate 1 cm x 2 cm diameter. It is a deep  gray color at the base but then a more yellow-green color on the top surface.  On the medial aspect of the left calf:   This lesion is also somewhat oval but asymmetric shape. This also measures up approximate 1 cm x 2 cm. This lesion has area of scale and crust. Neuro: Alert and oriented X 3. Moves all extremities spontaneously. Gait is normal. CNII-XII grossly in tact. Psych:  Responds to questions appropriately with a normal affect.     ASSESSMENT AND PLAN:  71 y.o. year old female with  1. Neoplasm of scalp - Ambulatory referral to Dermatology I have marked this referral is urgent. As well I will discuss with the referral coordinator regarding trying to get patient in as soon as possible. I also told patient that if she had not gotten any phone call about this appointment within 1 week to call here and speak to [facility]. 2. 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

## 2017-06-22 ENCOUNTER — Ambulatory Visit: Payer: PPO | Admitting: Rheumatology

## 2017-06-22 ENCOUNTER — Ambulatory Visit (INDEPENDENT_AMBULATORY_CARE_PROVIDER_SITE_OTHER): Payer: PPO

## 2017-06-22 ENCOUNTER — Ambulatory Visit (INDEPENDENT_AMBULATORY_CARE_PROVIDER_SITE_OTHER): Payer: Self-pay

## 2017-06-22 ENCOUNTER — Encounter: Payer: Self-pay | Admitting: Rheumatology

## 2017-06-22 ENCOUNTER — Ambulatory Visit (INDEPENDENT_AMBULATORY_CARE_PROVIDER_SITE_OTHER): Payer: PPO | Admitting: Rheumatology

## 2017-06-22 VITALS — BP 153/75 | HR 76 | Resp 17 | Ht 59.0 in | Wt 194.0 lb

## 2017-06-22 DIAGNOSIS — Z8639 Personal history of other endocrine, nutritional and metabolic disease: Secondary | ICD-10-CM

## 2017-06-22 DIAGNOSIS — M19041 Primary osteoarthritis, right hand: Secondary | ICD-10-CM

## 2017-06-22 DIAGNOSIS — E6609 Other obesity due to excess calories: Secondary | ICD-10-CM

## 2017-06-22 DIAGNOSIS — M0579 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 without organ or systems involvement: Secondary | ICD-10-CM

## 2017-06-22 DIAGNOSIS — R918 Other nonspecific abnormal finding of lung field: Secondary | ICD-10-CM | POA: Diagnosis not present

## 2017-06-22 DIAGNOSIS — Z9049 Acquired absence of other specified parts of digestive tract: Secondary | ICD-10-CM | POA: Diagnosis not present

## 2017-06-22 DIAGNOSIS — Z8679 Personal history of other diseases of the circulatory system: Secondary | ICD-10-CM

## 2017-06-22 DIAGNOSIS — M19042 Primary osteoarthritis, left hand: Secondary | ICD-10-CM

## 2017-06-22 DIAGNOSIS — Z79899 Other long term (current) drug therapy: Secondary | ICD-10-CM

## 2017-06-22 DIAGNOSIS — K219 Gastro-esophageal reflux disease without esophagitis: Secondary | ICD-10-CM | POA: Diagnosis not present

## 2017-06-22 DIAGNOSIS — Z96653 Presence of artificial knee joint, bilateral: Secondary | ICD-10-CM

## 2017-06-22 DIAGNOSIS — Z6839 Body mass index (BMI) 39.0-39.9, adult: Secondary | ICD-10-CM

## 2017-06-22 HISTORY — DX: Presence of artificial knee joint, bilateral: Z96.653

## 2017-06-22 NOTE — Patient Instructions (Signed)
Standing Labs We placed an order today for your standing lab work.    Please come back and get your standing labs in December and every 3 months  We have open lab Monday through Friday from 8:30-11:30 AM and 1:30-4 PM at the office of Dr. Jermarcus Mcfadyen.   The office is located at [address], [address], [place], [place] [address] No appointment is necessary.   Labs are drawn by [facility].  You may receive a bill from [facility] for your lab work. If you have any questions regarding directions or hours of operation,  please call [phone].    

## 2017-06-24 ENCOUNTER — Telehealth: Payer: Self-pay

## 2017-06-24 DIAGNOSIS — L308 Other specified dermatitis: Secondary | ICD-10-CM | POA: Diagnosis not present

## 2017-06-24 DIAGNOSIS — D485 Neoplasm of uncertain behavior of skin: Secondary | ICD-10-CM | POA: Diagnosis not present

## 2017-06-24 DIAGNOSIS — L0109 Other impetigo: Secondary | ICD-10-CM | POA: Diagnosis not present

## 2017-06-24 DIAGNOSIS — L570 Actinic keratosis: Secondary | ICD-10-CM | POA: Diagnosis not present

## 2017-06-24 DIAGNOSIS — L0889 Other specified local infections of the skin and subcutaneous tissue: Secondary | ICD-10-CM | POA: Diagnosis not present

## 2017-06-24 NOTE — Telephone Encounter (Signed)
Spoke with patient at her visit about her [employer] for [id number]. Application was completed and signed by the patient. She provided her financial documents as well. Application will need a current prior authorization with insurance on file. Will fax application one provider portion and prior authorization is complete. Will update once we receive a response.   Jacey Eckerson, [place], CPhT 2:11 PM

## 2017-06-24 NOTE — Telephone Encounter (Signed)
A prior authorization is required for patient's [employer]. Authorization was submitted to pts insurance via cover my meds. Will update once we receive a response.   Nazaret Chea, [place], CPhT 2:03 PM

## 2017-06-27 NOTE — Telephone Encounter (Signed)
Application was faxed to foundation. Will update once we receive a response.   Andrw Mcguirt, [place], CPhT 10:53 AM

## 2017-06-27 NOTE — Telephone Encounter (Addendum)
Received a confirmation from cover my meds regarding a prior authorization DENIAL for Enbrel.   Reference [id number] Phone [phone]  Called to get clarification. Spoke with Herbie Baltimore who sates that the patient was denied due to pt not meeting the requirements and a letter will be faxed to the clinic with details. An appeal was started over the phone and we will be notified for additional information if needed. We should have a response within 7 days.   Will send document to scan center once received.  [name], [place], CPhT 10:46 AM

## 2017-07-05 NOTE — Telephone Encounter (Signed)
Called patients insurance to check the status of patients appeal. Spoke with Roselyn Reef who states that the authorization has been denied. She could not give me any information on why but she gave me a number to [employer].   Called [employer]. Spoke with Caryl Pina who states that the patient has been denied because the appeal request was missing information. We must call [phone] to restart another appeal or have them refax the information to submit.  Called [employer] back to restart the application. Spoke to Banner Elk who faxed the appeal application to the clinic. We should received the fax within 24 hours.  We will fill out the appeal and send it in to be processed.   Reference number: [id number]   Demetrios Loll, CPhT 11:55 AM

## 2017-07-06 NOTE — Telephone Encounter (Signed)
Received a fax with the denial and appeal information for Enbrel.   Please submit an appeal to satisfy the requirements for [employer] application. Patient had an inadequate response to Arava and Plaquenil and Methotrexate causes elevated creatinine levels (see office visit from 05/2016 [facility]). She has been on Enbrel since 06/25/2016.   Once the appeal has been completed, we will fax the documents and update once we receive a response.   [name], [place], CPhT 11:41 AM

## 2017-07-12 ENCOUNTER — Other Ambulatory Visit: Payer: Self-pay | Admitting: Physician Assistant

## 2017-07-12 NOTE — Telephone Encounter (Signed)
Called [employer] to check the status of pateints application. Spoke with Bartolo Darter who states that the in pending a benefits determination. We should have an answer by the end of the year.   Will update once we receive a response.   Laparis Durrett, [place], CPhT 1:44 PM

## 2017-07-18 ENCOUNTER — Encounter: Payer: Self-pay | Admitting: Pharmacist

## 2017-07-18 NOTE — Telephone Encounter (Signed)
Appeal Letter has been written.

## 2017-07-21 ENCOUNTER — Ambulatory Visit: Payer: PPO | Admitting: Physician Assistant

## 2017-07-21 ENCOUNTER — Encounter: Payer: Self-pay | Admitting: Physician Assistant

## 2017-07-21 ENCOUNTER — Other Ambulatory Visit: Payer: Self-pay

## 2017-07-21 VITALS — BP 124/80 | HR 71 | Temp 97.9°F | Resp 16 | Ht 59.0 in | Wt 191.0 lb

## 2017-07-21 DIAGNOSIS — K219 Gastro-esophageal reflux disease without esophagitis: Secondary | ICD-10-CM

## 2017-07-21 DIAGNOSIS — M0579 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 without organ or systems involvement: Secondary | ICD-10-CM

## 2017-07-21 DIAGNOSIS — I10 Essential (primary) hypertension: Secondary | ICD-10-CM | POA: Diagnosis not present

## 2017-07-21 DIAGNOSIS — E785 Hyperlipidemia, unspecified: Secondary | ICD-10-CM | POA: Diagnosis not present

## 2017-07-21 DIAGNOSIS — L309 Dermatitis, unspecified: Secondary | ICD-10-CM | POA: Diagnosis not present

## 2017-07-21 DIAGNOSIS — K760 Fatty (change of) liver, not elsewhere classified: Secondary | ICD-10-CM | POA: Diagnosis not present

## 2017-07-21 DIAGNOSIS — R739 Hyperglycemia, unspecified: Secondary | ICD-10-CM | POA: Insufficient documentation

## 2017-07-21 MED ORDER — SULFAMETHOXAZOLE-TRIMETHOPRIM 800-160 MG PO TABS: 1.0000 | ORAL_TABLET | Freq: Two times a day (BID) | ORAL | 0 refills | Status: DC

## 2017-07-21 MED ORDER — FLUCONAZOLE 150 MG PO TABS: 150.0000 mg | ORAL_TABLET | Freq: Once | ORAL | 0 refills | Status: AC

## 2017-07-21 NOTE — Progress Notes (Signed)
Patient ID: [name] MRN: [id number], DOB: 1945/10/06, 71 y.o. Date of Encounter: @DATE @  Chief Complaint:  Chief Complaint  Patient presents with  . routine follow up visit    HPI: 71 y.o. year old white female  presents for routine followup office visit.     She had a lot of medical problems in 2014-2015.   Approx 06/2013 she had laparoscopic cholecystectomy.   Her next routine office visit with me was 11/21/2013. She had a long hospitalization---10/15/13-10/21/2013-- with symptoms of mostly vomiting. Many many tests are performed. The patient stated that no definitive diagnosis could ever be confirmed and it was felt that she just has some nonspecific virus.  She had another hospitalization  03/16/14 - 03/19/14. During that hospitalization, she was found to have a pulmonary nodule. Since then she has been having follow-up with Dr. Melvyn Novas.    05/22/2014 she reported that she has been feeling good and has gotten back to her walking. [name] that she walks with her granddaughter and her baby. Says  they  "walk whenever they can"-- which usually is about 3-5 times per week. At all of her visits with me since then, she has continued to tell me that she has continued to do these walks with her granddaughter and her baby whenever the weather is good. Again at her visit today 73/2016 she reports this.  73/19/2016: she says this is the same---still walking same amount. Still feeling good.  She is taking her blood pressure medications as directed but no adverse effects. No lower extremity edema and no lightheadedness. She is taking Lipitor at 20 mg. No myalgias or other adverse effects. Last A1C 10/10/14--A1C was down to 5.7. Metformin was discontinued completely. She says that she did stop this. She says that she has not been checking her blood sugar.Says that they did have a screening at her work and at that time her fasting sugar was 115.   Rheumatoid arthritis(714.0) Per Dr.  Estanislado Pandy ADDENDUM--ADDED 05/21/2015: I received office note from Dr. Estanislado Pandy dated 05/16/15. The following is copied from that [employer] note: "Pt reports that she has been experiencing increased pain and discomfort in multiple joints. She has pain in her bilateral shoulders, bilateral wrist joints and hands. Tenderness on palpation over her MCPs and PIPs. Also has thickening of bilateral CMC joints due to underlying osteoarthritis. She has swelling and warmth over bilateral ankle joints. She appears to be having a flare of her rheumatoid arthritis. She is on high risk prescription, taking methotrexate 6 tablets per week, which is not working as well for her. She could not increase her dose of methotrexate any further. She does have osteoarthritis, which causes some discomfort also. Different treatment options and their side effects were discussed at length. Discussed option of using combination therapy and adding plaquenil to her regimen. She was prescribed Plaquenil 200 mg 1 by mouth daily 90 day supply with 1 refill. Was advised to get eye exams to monitor for ocular toxicity. Orders given for labs every 3 months to monitor drug toxicity. They also discussed that most likely she would have to get on more aggressive therapy than just methotrexate and Plaquenil in the future. To cover that she checked TB cold, HIV, SPEP, immunoglobulins. Dr. Estanislado Pandy noted she is anemic and told her to follow-up with her PCP regarding that.  07/14/2015: She states that she has not been doing her walking. Says that she's been hurting. Says that it just hasn't worked out. Says she is feeling  better with the plaquenil. She is taking all blood pressure medicines as directed. No lower extremity edema. No lightheadedness. She is taking her Lipitor. No myalgias or other adverse effects. She is trying to be careful with her carbohydrates.   ADDENDUM ADDED 12/08/2015: Received OV note from Dr. Estanislado Pandy dated 12/02/2105. "She states  she has been having increased pain and swelling in her hands. Due to her elevated creatinine, we had to decrease her methotrexate from 6 to 5 and now to 4 tablets per week and she states she has been having increased pain and swelling to the point she is having difficulty making a fist." "---She is taking Plaquenil 200mg  a day along with methotrexate, which is not controlling her disease. We had detailed discussion regarding her current situation. We will d/c methotrexate and start [place]. Continue the Plaquenil for now---"  01/12/2016: Says she is still having significant pain. Still unable to do her walking, exercise. She is taking all blood pressure medicines as directed. No lower extremity edema. No lightheadedness. She is taking her Lipitor. No myalgias or other adverse effects. She is trying to be careful with her carbohydrates.   Addendum Added 06/03/2016: Received OV note from Dr. Estanislado Pandy dated 05/25/2016. At that visit patient reported that she had been having flare of her arthritis.  She no longer felt like the Arava and Plaquenil were adequately addressing her rheumatoid arthritis.  At that visit they discussed Enbrel at length and the patient was agreeable to try biologic. Planned: Enbrel. If Enbrel fails, then try [place] while on Enbrel and stop the Plaquenil.  "Prescription for Pneumovax. Check with PCP when the last one was given and take if it is due" I reviewed her Immunizations---She received Prevnar 13---07/04/2013.                                              ----She received Pneumovax 23--06/23/2011, 08/02/2012   07/19/2016: She states that she thinks that the Enbrel is starting to work some. Says that her hands and other joints don't feel quite as stiff and feel like they are moving a little bit more. She states that she has had this bad cough for a couple weeks now. Says that about a month ago she had cold symptoms and stuffy nose but then that got better  but the cough has lingered and now the cough has been worse for about 1-1/2-2 weeks now. Says that her daughter lives with her---her daughter recently had community-acquired pneumonia.  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  She is taking blood pressure medications as directed. No lightheadedness or other adverse effects.  She is taking Lipitor as directed. No myalgias or other adverse effects.  She is being careful with her diet and following low carbohydrate diet. Exercise is limited secondary to her arthritis.   01/19/2017: She still sees Dr. Estanislado Pandy for her RA. Says that she has her on Enbrel now and it is working great. Says that she is walking 1-2 miles 3 days a week. Asked if she was sticking with low-carb diet. She says that she has been trying to cut back on some things that she had been  slacking off on. She is taking blood pressure medications as directed. No lightheadedness or other adverse effects. She is taking Lipitor as directed. No myalgias or other adverse effects. She is taking omeprazole for GERD and is taking her magnesium supplement. She has no specific complaints or concerns to address today.  07/21/2017: Today, she states that the Enbrel continues to work well and she is able to do her walking and be active. Today I noted that she still has a lesion on the top of her scalp though it does look like it may be smaller than what it was when she came for recent visit with me.  I asked her what dermatology told her.  Says that they told her that "it came back staph infection "asked about treatment.  She says that they applied some type of treatment to it there.  Says that they also gave her an ointment to apply.  Asked if she was given an antibiotic and she says that she was.  However she then shows me  another area of "rash" on her left forearm that is new.  Also she pulls up her pants to show me the area on her left leg.  Asked if she has shown that to dermatology and so she says she did.  They had given her the ointment for her to put on it.  Not know the name of the medication.  Currently does not have follow-up appointment scheduled for dermatology. Continues to follow low carbohydrate diet and is able to do some activity now. She is taking blood pressure medications as directed. No lightheadedness or other adverse effects. She is taking Lipitor as directed. No myalgias or other adverse effects. She is taking omeprazole for GERD and is taking her magnesium supplement.    Past Medical History:  Diagnosis Date  . Arthritis    osteoarthritis  . Bursitis of right shoulder   . Chronic cholecystitis with calculus 05/04/2013  . 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 Diabetes type 2, controlled ([place]) 05/04/2013  . Fatty liver disease, nonalcoholic 1937   hospitalized, MRCP dx fatty liver  . GERD (gastroesophageal reflux disease)   . Hormone replacement therapy (postmenopausal)   . Hyperlipidemia   . Hypertension   . Normal cardiac stress test    pt can't remember when or where  . Obesity, unspecified 12/27/2012  . Rheumatoid arthritis(714.0) 09/28/2012  . 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 Unspecified essential hypertension 05/04/2013     Home Meds: Outpatient Medications Prior to Visit  Medication Sig Dispense Refill  . amLODipine (NORVASC) 5 MG tablet TAKE 1 TABLET DAILY 90 tablet 1  . aspirin EC 81 MG tablet Take 81 mg by mouth daily.    Marland Kitchen atorvastatin (LIPITOR) 10 MG tablet TAKE 1 TABLET DAILY (NEW DOSE) 90 tablet 1  . Calcium Carbonate-Vitamin D (CALCIUM 600 + D PO) Take 1 tablet by mouth 2 (two) times daily.    . cloNIDine (CATAPRES) 0.3 MG tablet TAKE 1 TABLET TWICE A DAY 180 tablet 1  . cyanocobalamin 500 MCG tablet Take 1  tablet (500 mcg total) by mouth daily. 30 tablet 0  . diclofenac sodium (VOLTAREN) 1 % GEL Apply 3 g topically daily as needed (pain). To 3 large joints    . ENBREL SURECLICK 50 MG/ML injection INJECT 50 MG (0.98 ML) UNDER THE SKIN ONCE A WEEK 3.92 mL 2  . estradiol (ESTRACE) 0.5 MG tablet Take 0.5  mg by mouth 2 (two) times daily.    . Ferrous Sulfate (IRON) 325 (65 Fe) MG TABS Take by mouth daily.    . fish oil-omega-3 fatty acids 1000 MG capsule Take 1 g by mouth at bedtime.     . folic acid (FOLVITE) 1 MG tablet TAKE 1 TABLET BY MOUTH TWICE A DAY 90 tablet 3  . leflunomide (ARAVA) 10 MG tablet TAKE 1 TABLET BY MOUTH EVERY DAY 90 tablet 0  . losartan-hydrochlorothiazide (HYZAAR) 100-12.5 MG tablet Take 1 tablet by mouth daily. 90 tablet 1  . magnesium oxide (MAG-OX) 400 MG tablet TAKE 1 TABLET BY MOUTH TWICE A DAY 60 tablet 3  . metoprolol tartrate (LOPRESSOR) 25 MG tablet TAKE ONE-HALF (1/2) TABLET TWICE A DAY 90 tablet 0  . Multiple Vitamins-Minerals (MULTIVITAMINS THER. W/MINERALS) TABS Take 1 tablet by mouth daily.    Marland Kitchen omeprazole (PRILOSEC) 20 MG capsule Take 1 capsule (20 mg total) by mouth daily. 90 capsule 3   No facility-administered medications prior to visit.     Allergies:  Allergies  Allergen Reactions  . Monopril [Fosinopril] Cough  . Norvasc [Amlodipine Besylate] Other (See Comments)    10 mg dose causes LE edema.  Tolerates 5mg  dose.    Social History   Socioeconomic History  . Marital status: Divorced    Spouse name: Not on file  . Number of children: Not on file  . Years of education: Not on file  . Highest education level: Not on file  Social Needs  . Financial resource strain: Not on file  . Food insecurity - worry: Not on file  . Food insecurity - inability: Not on file  . Transportation needs - medical: Not on file  . Transportation needs - non-medical: Not on file  Occupational History  . Not on file  Tobacco Use  . Smoking status: Former Smoker     Packs/day: 0.25    Years: 2.00    Pack years: 0.50    Types: Cigarettes    Last attempt to quit: 11/21/2013    Years since quitting: 3.6  . Smokeless tobacco: Never Used  . Tobacco comment: smoked regulary for approx 10 yrs- stopped in her 21's- but still smokes occ cig/Aug 2015  Substance and Sexual Activity  . Alcohol use: Yes    Comment: occasional  . Drug use: No  . Sexual activity: No  Other Topics Concern  . Not on file  Social History Narrative  . Not on file    Family History  Problem Relation Age of Onset  . Cancer Mother        breast  . Heart attack Mother   . Stroke Father   . Diabetes Other   . Heart disease Other   . Diabetes Sister      Review of Systems:  See HPI for pertinent ROS. All other ROS negative.    Physical Exam: Blood pressure 124/80, pulse 71, temperature 97.9 F (36.6 C), temperature source Oral, resp. rate 16, height 4\' 11"  (1.499 m), weight 86.6 kg (191 lb), SpO2 96 %., Body mass index is 38.58 kg/m. General: Obese WF. Appears in no acute distress. Neck: Supple. No thyromegaly. No lymphadenopathy. No carotid bruit. Lungs: Clear bilaterally to auscultation without wheezes, rales, or rhonchi. Breathing is unlabored. Heart: RRR with S1 S2. No murmurs, rubs, or gallops. Abdomen: Soft, non-tender, non-distended with normoactive bowel sounds. No hepatomegaly. No rebound/guarding. No obvious abdominal masses. Musculoskeletal:  Strength and tone normal for age. Extremities/Skin:  Top of scalp: There is ~ 1.5 cm x 4 mm area of golden scab.  Left forearm: There is~ 1inch diameter area of excoriation, papules. Right foremarm: small area of rash and excoriation. Left calf: ~ 1 inch area of papules and excoriation and scab. Diabetic foot exam: Inspection is normal. No deformities, no ulcerations no areas of skin breakdown bilaterally. Sensation is intact throughout. 2+ posterior tibialis pulses bilaterally. No palpable dorsalis pedis pulses  bilaterally. Neuro: Alert and oriented X 3. Moves all extremities spontaneously. Gait is normal. CNII-XII grossly in tact. Psych:  Responds to questions appropriately with a normal affect.     ASSESSMENT AND PLAN:  71 y.o. year old female with    Dermatitis Told her to go home and call dermatology and schedule follow-up visit with dermatology.  The interim she is to apply the topical medication that they had prescribed and apply this to all areas affected including her scalp both forearms and her calf of her leg.  I think she has an underlying dermatitis with secondary bacterial infection.  Give another round of antibiotic she is to start the Bactrim now take as directed.  She states that antibiotic always cause yeast infections that she can use the Diflucan at the end of the round of antibiotic.  Aloe up with dermatology. - sulfamethoxazole-trimethoprim (BACTRIM DS,SEPTRA DS) 800-160 MG tablet; Take 1 tablet by mouth 2 (two) times daily.  Dispense: 20 tablet; Refill: 0 - fluconazole (DIFLUCAN) 150 MG tablet; Take 1 tablet (150 mg total) by mouth once for 1 dose.  Dispense: 1 tablet; Refill: 0   Hypomagnesemia 07/21/2017: In past--checked Mg b/c she was on PPI---Mg was low--she has been on supplement---recheck now. - Magnesium   Prior h/o Diabetes type 2, controlled-----no longer requiring meds---controlled with diet, exercise, monitoring A1C  She is on aspirin 81 mg a day. She is on ARB. She is intolerant to ACE inhibitors secondary to cough. She is on statin. LDL has been at goal of less than 70.  She saw Ophthalmologist for diabetic eye exam in February 2017---"he says everything looks good" Foot exam is normal. She reports that she has needed no neuropathy symptoms.   Essential hypertension,  11/292018: At goal/controlled.   On ARB. Intolerant to ACE inhibitor.   Continue current medications. Check lab to monitor.   Hyperlipidemia On Lipitor.At Lab Result 05/22/14--decreased  Lipitor dose to 20 mg . F/U  FLP 07/10/14---FLP Excellent.  07/21/2017:-----Recheck labs again today to monitor. She is fasting.   Fatty liver disease, nonalcoholic 66/44/0347: LFTs have been stable. Recheck LFTs now  Anemia, unspecified anemia type - CBC was done 05/22/14 and was stable.  Hormone replacement therapy (postmenopausal) 07/21/2017:Per Gyn  Rheumatoid arthritis(714.0) 07/21/2017:Per Dr. Estanislado Pandy  ADDENDUM--ADDED 05/21/2015: I received office note from Dr. Estanislado Pandy dated 05/16/15. The following is copied from that [facility] note: "Pt reports that she has been experiencing increased pain and discomfort in multiple joints. She has pain in her bilateral shoulders, bilateral wrist joints and hands. Tenderness on palpation over her MCPs and PIPs. Also has thickening of bilateral CMC joints due to underlying osteoarthritis. She has swelling and warmth over bilateral ankle joints. She appears to be having a flare of her rheumatoid arthritis. She is on high risk prescription, taking methotrexate 6 tablets per week, which is not working as well for her. She could not increase her dose of methotrexate any further. She does have osteoarthritis, which causes some discomfort also. Different treatment options and their side effects were discussed  at length. Discussed option of using combination therapy and adding plaquenil to her regimen. She was prescribed Plaquenil 200 mg 1 by mouth daily 90 day supply with 1 refill. Was advised to get eye exams to monitor for ocular toxicity. Orders given for labs every 3 months to monitor drug toxicity. They also discussed that most likely she would have to get on more aggressive therapy than just methotrexate and Plaquenil in the future. To cover that she checked TB cold, HIV, SPEP, immunoglobulins. Dr. Estanislado Pandy noted she is anemic and told her to follow-up with her PCP regarding that.  ADDENDUM ADDED 12/08/2015: Received OV note from Dr. Estanislado Pandy dated 12/02/2105.  "She states she has been having increased pain and swelling in her hands. Due to her elevated creatinine, we had to decrease her methotrexate from 6 to 5 and now to 4 tablets per week and she states she has been having increased pain and swelling to the point she is having difficulty making a fist." "---She is taking Plaquenil 200mg  a day along with methotrexate, which is not controlling her disease. We had detailed discussion regarding her current situation. We will d/c methotrexate and start [place]. Continue the Plaquenil for now---"  Gastroesophageal reflux disease, esophagitis presence not specified 07/21/2017:: This is controlled with current medication.  Multiple pulmonary nodules She has been following up with Dr. Melvyn Novas at pulmonary. At [facility] 05/22/14 she said that she is scheduled for "another scan in November". At [facility] 07/10/14--she had f/u scan November. Says there was no change and she is to f/u 6 months.  --Says at last check, no change. Dr. Melvyn Novas told her can wait 2 years to repeat scan. 01/19/2017:Reminded her to f/u with Dr. Minna Merritts  Pre[facility]   Colonoscopy: 04/30/2011 colon polyp. She says she was told to repeat 10 years.    Pap smear, mammogram, and BMD : Sees GYN annually and may manage this.  At [facility] 11/21/2013 she reported that they do the mammograms there in the gynecology office itself. [name] this is at [facility]. I had her sign a release that day so that we could get those records to enter into our abstraction for quality metrics.  07/21/2017: Reports that she does have regular routine mammograms performed at her gynecologist Dr. Jadene Pierini.  States that she just recently had visit there.  Addendum Added 02/17/2017----Received Bone Density Report from [facility]--Test performed 01/26/2017 T scores were +0.20, -1.80, -1.50, -1.20, -1.40, - 0.6, -0.5, considered osteopenia. She is to continue calcium and vitamin D and weightbearing exercise.   Immunizations:  Tdap: 05/2011   Pneumovax: 05/2011  Prevnar 13: 07/04/2013  Influenza vaccine she did receive this in the fall of 2014. Says she received influenza vaccine at work for free ---to cover 2015-2016 Flu Season. Received 05/2015/ Received 2017 Zostavax: 09/2011  Until 12/2014, she had been having office as its with me every 3 months because she had been requiring diabetic medications. However at that time she was able to come off of diabetic medications. At that time, it was decided she can wait 6 months to have follow-up visit. However I did tell her to occasionally check her blood sugar just to make sure that it is not starting to drift upward. Routine followup office visit in 6 months or sooner if needed.    Signed, [address] [place], [place], [facility] 07/21/2017 9:01 AM

## 2017-07-22 ENCOUNTER — Telehealth: Payer: Self-pay

## 2017-07-22 LAB — COMPLETE METABOLIC PANEL WITH GFR
AG Ratio: 1.2 (calc) (ref 1.0–2.5)
ALKALINE PHOSPHATASE (APISO): 111 U/L (ref 33–130)
ALT: 21 U/L (ref 6–29)
AST: 24 U/L (ref 10–35)
Albumin: 3.6 g/dL (ref 3.6–5.1)
BILIRUBIN TOTAL: 0.4 mg/dL (ref 0.2–1.2)
BUN / CREAT RATIO: 18 (calc) (ref 6–22)
BUN: 17 mg/dL (ref 7–25)
CHLORIDE: 101 mmol/L (ref 98–110)
CO2: 27 mmol/L (ref 20–32)
CREATININE: 0.96 mg/dL — AB (ref 0.60–0.93)
Calcium: 9.5 mg/dL (ref 8.6–10.4)
GFR, Est African American: 68 mL/min/{1.73_m2} (ref >=60)
GFR, Est Non African American: 59 mL/min/{1.73_m2} — ABNORMAL LOW (ref >=60)
Globulin: 2.9 g/dL (calc) (ref 1.9–3.7)
Glucose, Bld: 160 mg/dL — ABNORMAL HIGH (ref 65–99)
Potassium: 3.8 mmol/L (ref 3.5–5.3)
Sodium: 140 mmol/L (ref 135–146)
Total Protein: 6.5 g/dL (ref 6.1–8.1)

## 2017-07-22 LAB — LIPID PANEL
CHOL/HDL RATIO: 2.5 (calc) (ref <5.0)
Cholesterol: 175 mg/dL (ref <200)
HDL: 71 mg/dL (ref >50)
LDL Cholesterol (Calc): 82 mg/dL (calc)
NON-HDL CHOLESTEROL (CALC): 104 mg/dL (ref <130)
TRIGLYCERIDES: 119 mg/dL (ref <150)

## 2017-07-22 LAB — HEMOGLOBIN A1C
HEMOGLOBIN A1C: 7.2 %{Hb} — AB (ref <5.7)
Mean Plasma Glucose: 160 (calc)
eAG (mmol/L): 8.9 (calc)

## 2017-07-22 LAB — MAGNESIUM: MAGNESIUM: 1 mg/dL — AB (ref 1.5–2.5)

## 2017-07-22 MED ORDER — METFORMIN HCL 500 MG PO TABS: 500.0000 mg | ORAL_TABLET | Freq: Two times a day (BID) | ORAL | 3 refills | Status: DC

## 2017-07-22 NOTE — Telephone Encounter (Signed)
Received a fax from [employer] regarding a prior authorization approval for ENBREL from 07/19/17 to 08/22/18.   Reference number:NONE Phone [phone]  Will send document to scan center and to [place].   Geoffery Aultman, [place], CPhT 3:51 PM

## 2017-07-22 NOTE — Telephone Encounter (Signed)
I spoke with patient she states she was out of her magnesium for about a week, but she is taking 400 mg 1 PO BID

## 2017-07-22 NOTE — Telephone Encounter (Signed)
Ok. Cont that dose. Please update med list for 400mg  BID

## 2017-07-22 NOTE — Telephone Encounter (Signed)
-----   Message from Orlena Sheldon, PA-C sent at 07/22/2017  7:21 AM EST ----- 1--A1C creeping up---Start Metformin 500mg  1 po BID # 60 +3 2---Cholesterol is good on current med--cont that the same 3---Magnesium is low--Reviewed Result Notes attached to last Mg lab--01/19/2017--I had recommended to increase dose--then note back stated that "strength had to be changed--pharmacy no longer able to get that strength" --CALL PT---Have her get out her magnesium bottle---find out dosage/strength of each tablet and find out how many she is taking each day so I can determine how to adjust dose

## 2017-07-25 NOTE — Telephone Encounter (Signed)
Medication list updated.

## 2017-08-05 ENCOUNTER — Other Ambulatory Visit: Payer: Self-pay

## 2017-08-05 ENCOUNTER — Telehealth: Payer: Self-pay

## 2017-08-05 MED ORDER — ATORVASTATIN CALCIUM 10 MG PO TABS: ORAL_TABLET | ORAL | 1 refills | Status: DC

## 2017-08-05 NOTE — Telephone Encounter (Signed)
error 

## 2017-08-05 NOTE — Telephone Encounter (Signed)
Received a fax from [employer] stating that the patients application has been denied because pt has not satisfied insurance plan guidelines. Patient has an approval letter from insurance on file that was faxed to the foundation on 11/30. Called foundation to clarify. Spoke with representative who as able to locate approval letter and update patients profile. Patients application will continue to be processed. She says to check back in January for a response. Will update once we receive a response.   Wash Nienhaus, [place], CPhT 11:47 AM

## 2017-08-22 ENCOUNTER — Telehealth: Payer: Self-pay | Admitting: Physician Assistant

## 2017-08-22 MED ORDER — CLONIDINE HCL 0.3 MG PO TABS: 0.3000 mg | ORAL_TABLET | Freq: Two times a day (BID) | ORAL | 1 refills | Status: DC

## 2017-08-22 NOTE — Telephone Encounter (Signed)
Pt needs refill on clonidine sent to [facility].

## 2017-08-22 NOTE — Telephone Encounter (Signed)
Rx sent to pharmacy   

## 2017-08-24 ENCOUNTER — Other Ambulatory Visit: Payer: Self-pay

## 2017-08-24 MED ORDER — LOSARTAN POTASSIUM-HCTZ 100-12.5 MG PO TABS: 1.0000 | ORAL_TABLET | Freq: Every day | ORAL | 1 refills | Status: DC

## 2017-08-24 NOTE — Telephone Encounter (Signed)
Refill appropriate 

## 2017-08-25 DIAGNOSIS — L308 Other specified dermatitis: Secondary | ICD-10-CM | POA: Diagnosis not present

## 2017-08-25 DIAGNOSIS — L570 Actinic keratosis: Secondary | ICD-10-CM | POA: Diagnosis not present

## 2017-09-07 ENCOUNTER — Telehealth: Payer: Self-pay

## 2017-09-07 NOTE — Telephone Encounter (Signed)
[place] to check the status of pts application. Spoke with Marylyn Ishihara who states that the pt application is currently being reviewed under a BIV. He could not give me a timeframe when we would have a response. He is hoping by the end of the month.   Called pt to update. She has about 2 month left of medication. Will continue to update when we receive a response.   Juno Bozard, [place], CPhT 3:40 PM

## 2017-10-11 ENCOUNTER — Other Ambulatory Visit: Payer: Self-pay

## 2017-10-11 MED ORDER — METOPROLOL TARTRATE 25 MG PO TABS: ORAL_TABLET | ORAL | 0 refills | Status: DC

## 2017-10-14 ENCOUNTER — Other Ambulatory Visit: Payer: Self-pay

## 2017-10-14 MED ORDER — METFORMIN HCL 500 MG PO TABS: 500.0000 mg | ORAL_TABLET | Freq: Two times a day (BID) | ORAL | 3 refills | Status: DC

## 2017-10-14 NOTE — Telephone Encounter (Signed)
Refill appropriate 

## 2017-10-19 ENCOUNTER — Other Ambulatory Visit: Payer: Self-pay | Admitting: Family Medicine

## 2017-10-31 ENCOUNTER — Telehealth: Payer: Self-pay | Admitting: Rheumatology

## 2017-10-31 NOTE — Telephone Encounter (Signed)
Called [employer] to check the status of pts application. Spoke with Pd who states that the application was showing that it still needed a PA letter. We faxed over the approval letter 06/2017. She was able to locate the letter and sent the application with notes for an escalated review. We should have an answer any day now.   Called pt to update. Left message.   Will update once we have a response.

## 2017-10-31 NOTE — Telephone Encounter (Signed)
Patient called  to see if you heard from [employer] on her Enbrel.

## 2017-11-02 NOTE — Telephone Encounter (Signed)
Received a fax from [employer] stating that patient has been approved to receive Enbrel free of charge   Will send document to scan center.   Called pt to update. Pt voices understanding and denies any questions at this time.   Clell Trahan, [place], CPhT 3:39 PM

## 2017-11-09 NOTE — Progress Notes (Signed)
Office Visit Note  Patient: [name]             Date of Birth: July 26, 1946           MRN: [id number]             PCP: Rennis Golden Referring: Orlena Sheldon, PA-C Visit Date: 11/22/2017 Occupation: @GUAROCC @    Subjective:  Medication Management, right shoulder pain.   History of Present Illness: [name] is a 72 y.o. female with history of seropositive rheumatoid arthritis and osteoarthritis.  She states she is doing quite well on combination of Enbrel and methotrexate.  She has been experiencing some discomfort in her right shoulder.  Bilateral total knee replacements are doing well.  Activities of Daily Living:  Patient reports morning stiffness for 10-15 minutes.   Patient Denies nocturnal pain.  Difficulty dressing/grooming: Denies Difficulty climbing stairs: Denies Difficulty getting out of chair: Denies Difficulty using hands for taps, buttons, cutlery, and/or writing: Denies   Review of Systems  Constitutional: Positive for fatigue. Negative for night sweats, weight gain and weight loss.  HENT: Positive for mouth dryness. Negative for mouth sores, trouble swallowing, trouble swallowing and nose dryness.   Eyes: Negative for pain, redness, visual disturbance and dryness.  Respiratory: Negative for cough, shortness of breath and difficulty breathing.   Cardiovascular: Negative for chest pain, palpitations, hypertension, irregular heartbeat and swelling in legs/feet.  Gastrointestinal: Negative for abdominal pain, blood in stool, constipation and diarrhea.  Endocrine: Negative for increased urination.  Genitourinary: Negative for pelvic pain and vaginal dryness.  Musculoskeletal: Positive for morning stiffness. Negative for arthralgias, joint pain, joint swelling, myalgias, muscle weakness, muscle tenderness and myalgias.  Skin: Negative for color change, rash, hair loss, skin tightness, ulcers and sensitivity to sunlight.  Allergic/Immunologic: Negative for  susceptible to infections.  Neurological: Negative for dizziness, headaches, memory loss, night sweats and weakness.  Hematological: Negative for bruising/bleeding tendency and swollen glands.  Psychiatric/Behavioral: Negative for depressed mood, confusion and sleep disturbance. The patient is not nervous/anxious.     PMFS History:  Patient Active Problem List   Diagnosis Date Noted  . Hyperglycemia 07/21/2017  . History of total bilateral knee replacement 06/22/2017  . History of hypertension 08/20/2016  . History of diabetes mellitus 08/20/2016  . High risk medication use 08/20/2016  . Primary osteoarthritis of both hands 08/20/2016  . Hypomagnesemia 07/21/2016  . Left thyroid nodule 03/05/2016  . Diabetic eye exam ([place]) 10/15/2014  . Multiple pulmonary nodules 04/04/2014  . Malnutrition of moderate degree ([place]) 03/18/2014  . Sepsis ([place]) 03/16/2014  . Anemia 11/21/2013  . 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 Bursitis of right shoulder   . GERD (gastroesophageal reflux disease)   . Morbid obesity due to excess calories ([place]) 12/27/2012  . 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 without organ or systems involvement ([place])   . Essential hypertension, benign 10/14/2012  . Hyperlipidemia   . Hormone replacement therapy (postmenopausal)   . Fatty liver disease, nonalcoholic   . Osteoarthritis of right knee 08/05/2012    Past Medical History:  Diagnosis Date  . Arthritis    osteoarthritis  . Bursitis of right shoulder   . Chronic cholecystitis with calculus 05/04/2013  . 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 Diabetes type 2, controlled ([place]) 05/04/2013  . Fatty liver disease, nonalcoholic 2671   hospitalized, MRCP dx fatty liver  . GERD (gastroesophageal reflux disease)   .  Hormone replacement therapy (postmenopausal)   . Hyperlipidemia   . Hypertension   . Normal cardiac stress test    pt can't remember when or where  .  Obesity, unspecified 12/27/2012  . Rheumatoid arthritis(714.0) 09/28/2012  . 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 Unspecified essential hypertension 05/04/2013    Family History  Problem Relation Age of Onset  . Cancer Mother        breast  . Heart attack Mother   . Stroke Father   . Diabetes Sister   . Diabetes Daughter    Past Surgical History:  Procedure Laterality Date  . ABDOMINAL HYSTERECTOMY    . CHOLECYSTECTOMY N/A 05/04/2013   Procedure: LAPAROSCOPIC CHOLECYSTECTOMY WITH INTRAOPERATIVE CHOLANGIOGRAM;  Surgeon: Haywood Lasso, MD;  Location: [facility];  Service: General;  Laterality: N/A;  . ERCP N/A 05/02/2013   Procedure: ENDOSCOPIC RETROGRADE CHOLANGIOPANCREATOGRAPHY (ERCP);  Surgeon: Beryle Beams, MD;  Location: [facility];  Service: Endoscopy;  Laterality: N/A;  . JOINT REPLACEMENT     left knee  . NASAL SINUS SURGERY    . SPHINCTEROTOMY  05/02/2013   Procedure: SPHINCTEROTOMY;  Surgeon: Beryle Beams, MD;  Location: [facility];  Service: Endoscopy;;  . TONSILLECTOMY    . TOTAL KNEE ARTHROPLASTY  08/01/2012   Procedure: TOTAL KNEE ARTHROPLASTY;  Surgeon: Meredith Pel, MD;  Location: [facility];  Service: Orthopedics;  Laterality: Right;  Right total knee arthroplasty  . TOTAL KNEE ARTHROPLASTY  08/01/2012   RIGHT  KNEE   Social History   Social History Narrative  . Not on file     Objective: Vital Signs: BP 124/68 (BP Location: Left Arm, Patient Position: Sitting, Cuff Size: Large)   Pulse 74   Resp 17   Ht 4\' 10"  (1.473 m)   Wt 189 lb (85.7 kg)   BMI 39.50 kg/m    Physical Exam  Constitutional: She is oriented to person, place, and time. She appears well-developed and well-nourished.  HENT:  Head: Normocephalic and atraumatic.  Eyes: Conjunctivae and EOM are normal.  Neck: Normal range of motion.  Cardiovascular: Normal rate, regular rhythm, normal heart sounds and intact distal pulses.  Pulmonary/Chest: Effort normal and breath  sounds normal.  Abdominal: Soft. Bowel sounds are normal.  Lymphadenopathy:    She has no cervical adenopathy.  Neurological: She is alert and oriented to person, place, and time.  Skin: Skin is warm and dry. Capillary refill takes less than 2 seconds.  Psychiatric: She has a normal mood and affect. Her behavior is normal.  Nursing note and vitals reviewed.    Musculoskeletal Exam: C-spine thoracic lumbar spine good range of motion.  Shoulder joints, elbow joints, wrist joints are good range of motion.  She has some DIP PIP and MCP thickening but no synovitis was noted.  Hip joints were in good range of motion.  She has bilateral total knee replacement which is doing well.  No ankle joint swelling was noted.  CDAI Exam: CDAI Homunculus Exam:   Tenderness:  RUE: glenohumeral  Joint Counts:  CDAI Tender Joint count: 1 CDAI Swollen Joint count: 0  Global Assessments:  Patient Global Assessment: 4 Provider Global Assessment: 4  CDAI Calculated Score: 9    Investigation: No additional findings.TB Gold: 05/17/2017 Negative  CBC Latest Ref Rng & Units 05/17/2017 01/20/2017 11/05/2016  WBC 3.8 - 10.8 Thousand/uL 6.9 5.2 5.2  Hemoglobin 11.7 - 15.5 g/dL 12.7 12.5 12.3  Hematocrit 35.0 - 45.0 % 37.9 38.3 37.2  Platelets  140 - 400 Thousand/uL 150 152 158   CMP Latest Ref Rng & Units 07/21/2017 05/17/2017 01/19/2017  Glucose 65 - 99 mg/dL 160(H) 182(H) 136(H)  BUN 7 - 25 mg/dL 17 17 20   Creatinine 0.60 - 0.93 mg/dL 0.96(H) 0.83 1.06(H)  Sodium 135 - 146 mmol/L 140 139 142  Potassium 3.5 - 5.3 mmol/L 3.8 3.6 3.6  Chloride 98 - 110 mmol/L 101 100 103  CO2 20 - 32 mmol/L 27 27 25   Calcium 8.6 - 10.4 mg/dL 9.5 9.0 9.8  Total Protein 6.1 - 8.1 g/dL 6.5 6.2 6.3  Total Bilirubin 0.2 - 1.2 mg/dL 0.4 0.4 0.4  Alkaline Phos 33 - 130 U/L - - 81  AST 10 - 35 U/L 24 28 24   ALT 6 - 29 U/L 21 23 18   07/21/2017 lipid panel normal, May 17, 2017 TB Gold negative  Imaging: No results  found.  Speciality Comments: No specialty comments available.    Procedures:  Large Joint Inj: R glenohumeral on 11/22/2017 12:12 PM Indications: pain Details: 27 G 1.5 in needle, posterior approach  Arthrogram: No  Medications: 1 mL lidocaine 1 %; 40 mg triamcinolone acetonide 40 MG/ML Aspirate: 0 mL Outcome: tolerated well, no immediate complications Procedure, treatment alternatives, risks and benefits explained, specific risks discussed. Consent was given by the patient. Immediately prior to procedure a time out was called to verify the correct patient, procedure, equipment, support staff and site/side marked as required. Patient was prepped and draped in the usual sterile fashion.     Allergies: Monopril [fosinopril] and Norvasc [amlodipine besylate]   Assessment / Plan:     Visit Diagnoses: 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 without organ or systems involvement ([place]) -  +RF, +CCP, -ANA.  Patient does not have any active synovitis except for pain and discomfort in her right shoulder.  She seems to be doing quite well on combination of Xeljanz and Enbrel.  Increased risk of toxicity by not monitoring medications was discussed at length.  We will get her labs today and then every 3 months to monitor for drug toxicity.  High risk medication use - Enbrel 50 mg subcutaneous every week(06/25/2016), Arava 10 mg by mouth daily.  - Plan: CBC with Differential/Platelet, COMPLETE METABOLIC PANEL WITH GFR, CBC with Differential/Platelet, COMPLETE METABOLIC PANEL WITH GFR, QuantiFERON-TB Gold Plus  Chronic right shoulder pain: She was having a lot of pain and discomfort and difficulty lifting her right shoulder.  After informed consent was obtained right shoulder joint was injected with cortisone as described above.  She is diabetic advised her to monitor her blood sugar closely.  Primary osteoarthritis of both hands: Chronic pain and discomfort.  History of total  bilateral knee replacement - Right= 2013. Left=2003: Doing well  History of diabetes mellitus: Patient states her blood sugars have been under control.  She will continue to monitor them.  History of hypertension: Effective cortisone on blood pressure was also discussed.  History of obesity: Weight loss diet and exercise was discussed.  Other medical problems are listed as follows:  Multiple pulmonary nodules  History of cholecystectomy  History of gastroesophageal reflux (GERD)  History of hyperlipidemia    Orders: Orders Placed This Encounter  Procedures  . Large Joint Inj  . CBC with Differential/Platelet  . COMPLETE METABOLIC PANEL WITH GFR  . CBC with Differential/Platelet  . COMPLETE METABOLIC PANEL WITH GFR  . QuantiFERON-TB Gold Plus   No orders of the defined types were placed in this  encounter.   Face-to-face time spent with patient was 30 minutes.  Greater than 50% of time was spent in counseling and coordination of care.  Follow-Up Instructions: Return in about 5 months (around 04/24/2018) for Rheumatoid arthritis.   Bo Merino, MD  Note - This record has been created using Editor, commissioning.  Chart creation errors have been sought, but may not always  have been located. Such creation errors do not reflect on  the standard of medical care.

## 2017-11-22 ENCOUNTER — Ambulatory Visit: Payer: PPO | Admitting: Rheumatology

## 2017-11-22 ENCOUNTER — Encounter: Payer: Self-pay | Admitting: Rheumatology

## 2017-11-22 VITALS — BP 124/68 | HR 74 | Resp 17 | Ht <= 58 in | Wt 189.0 lb

## 2017-11-22 DIAGNOSIS — Z8679 Personal history of other diseases of the circulatory system: Secondary | ICD-10-CM

## 2017-11-22 DIAGNOSIS — Z79899 Other long term (current) drug therapy: Secondary | ICD-10-CM

## 2017-11-22 DIAGNOSIS — Z96653 Presence of artificial knee joint, bilateral: Secondary | ICD-10-CM | POA: Diagnosis not present

## 2017-11-22 DIAGNOSIS — Z8719 Personal history of other diseases of the digestive system: Secondary | ICD-10-CM | POA: Diagnosis not present

## 2017-11-22 DIAGNOSIS — Z9049 Acquired absence of other specified parts of digestive tract: Secondary | ICD-10-CM | POA: Diagnosis not present

## 2017-11-22 DIAGNOSIS — M25511 Pain in right shoulder: Secondary | ICD-10-CM | POA: Diagnosis not present

## 2017-11-22 DIAGNOSIS — G8929 Other chronic pain: Secondary | ICD-10-CM | POA: Diagnosis not present

## 2017-11-22 DIAGNOSIS — Z8639 Personal history of other endocrine, nutritional and metabolic disease: Secondary | ICD-10-CM

## 2017-11-22 DIAGNOSIS — M0579 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 without organ or systems involvement: Secondary | ICD-10-CM

## 2017-11-22 DIAGNOSIS — M19041 Primary osteoarthritis, right hand: Secondary | ICD-10-CM

## 2017-11-22 DIAGNOSIS — M19042 Primary osteoarthritis, left hand: Secondary | ICD-10-CM

## 2017-11-22 DIAGNOSIS — R918 Other nonspecific abnormal finding of lung field: Secondary | ICD-10-CM

## 2017-11-22 MED ORDER — TRIAMCINOLONE ACETONIDE 40 MG/ML IJ SUSP
40.0000 mg | INTRAMUSCULAR | Status: AC | PRN
  Administered 2017-11-22: 40 mg via INTRA_ARTICULAR

## 2017-11-22 MED ORDER — LIDOCAINE HCL 1 % IJ SOLN
1.0000 mL | INTRAMUSCULAR | Status: AC | PRN
Start: 2017-11-22 — End: 2017-11-22
  Administered 2017-11-22: 1 mL

## 2017-11-22 NOTE — Patient Instructions (Signed)
Standing Labs We placed an order today for your standing lab work.    Please come back and get your standing labs in July and every 3 months TB Gold is due and September. You may do it in July or October  We have open lab Monday through Friday from 8:30-11:30 AM and 1:30-4:00 PM  at the office of Dr. Bo Merino.   You may experience shorter wait times on Monday and Friday afternoons. The office is located at [address], [place], [place], [place] [address] No appointment is necessary.   Labs are drawn by [facility].  You may receive a bill from [facility] for your lab work. If you have any questions regarding directions or hours of operation,  please call [phone].

## 2017-11-24 ENCOUNTER — Other Ambulatory Visit: Payer: Self-pay | Admitting: Physician Assistant

## 2017-11-25 ENCOUNTER — Other Ambulatory Visit: Payer: Self-pay

## 2017-11-25 DIAGNOSIS — Z79899 Other long term (current) drug therapy: Secondary | ICD-10-CM

## 2017-11-25 LAB — CBC WITH DIFFERENTIAL/PLATELET
BASOS PCT: 0.5 %
Basophils Absolute: 44 cells/uL (ref 0–200)
EOS ABS: 122 {cells}/uL (ref 15–500)
Eosinophils Relative: 1.4 %
HCT: 41.1 % (ref 35.0–45.0)
Hemoglobin: 14 g/dL (ref 11.7–15.5)
Lymphs Abs: 1862 cells/uL (ref 850–3900)
MCH: 31.4 pg (ref 27.0–33.0)
MCHC: 34.1 g/dL (ref 32.0–36.0)
MCV: 92.2 fL (ref 80.0–100.0)
MONOS PCT: 12.5 %
MPV: 13.5 fL — AB (ref 7.5–12.5)
NEUTROS PCT: 64.2 %
Neutro Abs: 5585 cells/uL (ref 1500–7800)
PLATELETS: 162 10*3/uL (ref 140–400)
RBC: 4.46 10*6/uL (ref 3.80–5.10)
RDW: 12.8 % (ref 11.0–15.0)
TOTAL LYMPHOCYTE: 21.4 %
WBC: 8.7 10*3/uL (ref 3.8–10.8)
WBCMIX: 1088 {cells}/uL — AB (ref 200–950)

## 2017-11-25 LAB — COMPLETE METABOLIC PANEL WITH GFR
AG Ratio: 1.3 (calc) (ref 1.0–2.5)
ALBUMIN MSPROF: 4 g/dL (ref 3.6–5.1)
ALKALINE PHOSPHATASE (APISO): 96 U/L (ref 33–130)
ALT: 23 U/L (ref 6–29)
AST: 26 U/L (ref 10–35)
BUN / CREAT RATIO: 23 (calc) — AB (ref 6–22)
BUN: 22 mg/dL (ref 7–25)
CO2: 27 mmol/L (ref 20–32)
CREATININE: 0.94 mg/dL — AB (ref 0.60–0.93)
Calcium: 9.9 mg/dL (ref 8.6–10.4)
Chloride: 102 mmol/L (ref 98–110)
GFR, EST NON AFRICAN AMERICAN: 60 mL/min/{1.73_m2} (ref >=60)
GFR, Est African American: 70 mL/min/{1.73_m2} (ref >=60)
GLOBULIN: 3 g/dL (ref 1.9–3.7)
Glucose, Bld: 140 mg/dL — ABNORMAL HIGH (ref 65–99)
Potassium: 3.9 mmol/L (ref 3.5–5.3)
SODIUM: 139 mmol/L (ref 135–146)
Total Bilirubin: 0.5 mg/dL (ref 0.2–1.2)
Total Protein: 7 g/dL (ref 6.1–8.1)

## 2017-11-28 NOTE — Progress Notes (Signed)
Within normal limits except glucose is elevated.

## 2017-11-30 ENCOUNTER — Other Ambulatory Visit: Payer: Self-pay | Admitting: Rheumatology

## 2017-11-30 NOTE — Telephone Encounter (Signed)
Last visit: 11/22/17 Next Visit: 04/26/18 Labs: 11/25/17 Within normal limits except glucose is elevated.  Okay to refill per Dr. Estanislado Pandy

## 2017-12-14 ENCOUNTER — Encounter: Payer: Self-pay | Admitting: Physician Assistant

## 2017-12-14 ENCOUNTER — Ambulatory Visit (INDEPENDENT_AMBULATORY_CARE_PROVIDER_SITE_OTHER): Payer: PPO | Admitting: Physician Assistant

## 2017-12-14 ENCOUNTER — Other Ambulatory Visit: Payer: Self-pay

## 2017-12-14 VITALS — BP 128/90 | HR 112 | Temp 98.6°F | Resp 18 | Ht <= 58 in | Wt 172.4 lb

## 2017-12-14 DIAGNOSIS — M0579 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 without organ or systems involvement: Secondary | ICD-10-CM

## 2017-12-14 DIAGNOSIS — J988 Other specified respiratory disorders: Secondary | ICD-10-CM | POA: Diagnosis not present

## 2017-12-14 DIAGNOSIS — B9689 Other specified bacterial agents as the cause of diseases classified elsewhere: Secondary | ICD-10-CM

## 2017-12-14 DIAGNOSIS — E119 Type 2 diabetes mellitus without complications: Secondary | ICD-10-CM | POA: Diagnosis not present

## 2017-12-14 MED ORDER — HYDROCODONE-HOMATROPINE 5-1.5 MG/5ML PO SYRP: 5.0000 mL | ORAL_SOLUTION | Freq: Four times a day (QID) | ORAL | 0 refills | Status: DC | PRN

## 2017-12-14 MED ORDER — LEVOFLOXACIN 750 MG PO TABS: 750.0000 mg | ORAL_TABLET | Freq: Every day | ORAL | 0 refills | Status: AC

## 2017-12-14 NOTE — Progress Notes (Signed)
Patient ID: [name] MRN: [id number], DOB: [birth year], 72 y.o. Date of Encounter: 12/14/2017, 12:47 PM    Chief Complaint:  Chief Complaint  Patient presents with  . Cough    x1week   . Generalized Body Aches    symptoms for 1 week   . Fever    symptoms for 1 week   . Chills    x1week      HPI: 72 y.o. year old female presents with above.   Reports that she has been having cough and chest congestion for over a week now.  Says that she has not had any congestion or mucus from the nose.  Says it is all in her chest.  Says that the cough is preventing her from getting much sleep.  Says that she is used to feeling tired from her rheumatoid arthritis, but is feeling really run down and low energy with this illness.  Has had no significant sore throat.  No other symptoms or concerns to address.     Home Meds:   Outpatient Medications Prior to Visit  Medication Sig Dispense Refill  . amLODipine (NORVASC) 5 MG tablet TAKE 1 TABLET DAILY 90 tablet 1  . aspirin EC 81 MG tablet Take 81 mg by mouth daily.    Marland Kitchen atorvastatin (LIPITOR) 10 MG tablet TAKE 1 TABLET DAILY (NEW DOSE) 90 tablet 1  . Calcium Carbonate-Vitamin D (CALCIUM 600 + D PO) Take 1 tablet by mouth 2 (two) times daily.    . cloNIDine (CATAPRES) 0.3 MG tablet Take 1 tablet (0.3 mg total) by mouth 2 (two) times daily. 180 tablet 1  . cyanocobalamin 500 MCG tablet Take 1 tablet (500 mcg total) by mouth daily. 30 tablet 0  . diclofenac sodium (VOLTAREN) 1 % GEL Apply 3 g topically daily as needed (pain). To 3 large joints    . ENBREL SURECLICK 50 MG/ML injection INJECT 50 MG (0.98 ML) UNDER THE SKIN ONCE A WEEK 3.92 mL 2  . estradiol (ESTRACE) 0.5 MG tablet Take 0.5 mg by mouth 2 (two) times daily.    . Ferrous Sulfate (IRON) 325 (65 Fe) MG TABS Take by mouth daily.    . fish oil-omega-3 fatty acids 1000 MG capsule Take 1 g by mouth at bedtime.     . folic acid (FOLVITE) 1 MG tablet TAKE 1 TABLET BY MOUTH TWICE A DAY 90  tablet 3  . leflunomide (ARAVA) 10 MG tablet TAKE 1 TABLET BY MOUTH EVERY DAY 90 tablet 0  . losartan-hydrochlorothiazide (HYZAAR) 100-12.5 MG tablet Take 1 tablet by mouth daily. 90 tablet 1  . magnesium oxide (MAG-OX) 400 MG tablet TAKE 1 TABLET BY MOUTH TWICE A DAY 60 tablet 3  . metFORMIN (GLUCOPHAGE) 500 MG tablet Take 1 tablet (500 mg total) by mouth 2 (two) times daily with a meal. 60 tablet 3  . metoprolol tartrate (LOPRESSOR) 25 MG tablet Take one-half(1/2) tablet twice a day 90 tablet 0  . Multiple Vitamins-Minerals (MULTIVITAMINS THER. W/MINERALS) TABS Take 1 tablet by mouth daily.    Marland Kitchen omeprazole (PRILOSEC) 20 MG capsule Take 1 capsule (20 mg total) by mouth daily. 90 capsule 3  . sulfamethoxazole-trimethoprim (BACTRIM DS,SEPTRA DS) 800-160 MG tablet Take 1 tablet by mouth 2 (two) times daily. 20 tablet 0   No facility-administered medications prior to visit.     Allergies:  Allergies  Allergen Reactions  . Monopril [Fosinopril] Cough  . Norvasc [Amlodipine Besylate] Other (See Comments)    10 mg  dose causes LE edema.  Tolerates 5mg  dose.      Review of Systems: See HPI for pertinent ROS. All other ROS negative.    Physical Exam: Blood pressure 128/90, pulse (!) 112, temperature 98.6 F (37 C), temperature source Oral, resp. rate 18, height 4\' 10"  (1.473 m), weight 78.2 kg (172 lb 6.4 oz), SpO2 91 %., Body mass index is 36.03 kg/m. General:  WF. Appears in no acute distress. HEENT: Normocephalic, atraumatic, eyes without discharge, sclera non-icteric, nares are without discharge. Bilateral auditory canals clear, TM's are without perforation, pearly grey and translucent with reflective cone of light bilaterally. Oral cavity moist, posterior pharynx without exudate, erythema, peritonsillar abscess.  Neck: Supple. No thyromegaly. No lymphadenopathy. Lungs: Clear bilaterally to auscultation without wheezes, rales, or rhonchi. Breathing is unlabored.  She does have congested  cough through visit but I hear no actual wheezing rhonchi or rales on exam. Heart: Regular rhythm. No murmurs, rubs, or gallops. Msk:  Strength and tone normal for age. Extremities/Skin: Warm and dry.  Neuro: Alert and oriented X 3. Moves all extremities spontaneously. Gait is normal. CNII-XII grossly in tact. Psych:  Responds to questions appropriately with a normal affect.     ASSESSMENT AND PLAN:  72 y.o. year old female with  1. Bacterial respiratory infection Does cough during visit and it does sound very congested but on exam I do not hear any actual wheezing rhonchi or rales.  Scattered adding low-dose prednisone but I am holding off on this.  I did tell her that I will go ahead and use a stronger antibiotic especially given that she does have RA and diabetes but to definitely call if she feels that symptoms are worsening at all or do not resolve upon completion of antibiotic.  Also will give her Hycodan to use as a cough suppressant especially at night so that she can get some sleep.  Take the antibiotic as directed.  Follow-up if needed. - levofloxacin (LEVAQUIN) 750 MG tablet; Take 1 tablet (750 mg total) by mouth daily for 7 days.  Dispense: 7 tablet; Refill: 0 - HYDROcodone-homatropine (HYCODAN) 5-1.5 MG/5ML syrup; Take 5 mLs by mouth every 6 (six) hours as needed.  Dispense: 120 mL; Refill: 0  2. 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 without organ or systems involvement ([place])   3. Type 2 diabetes mellitus without complication, without long-term current use of insulin [facility])    Signed, [address] [place], [place], [facility] 12/14/2017 12:47 PM

## 2018-01-03 ENCOUNTER — Other Ambulatory Visit: Payer: Self-pay | Admitting: Physician Assistant

## 2018-01-08 ENCOUNTER — Other Ambulatory Visit: Payer: Self-pay | Admitting: Physician Assistant

## 2018-01-19 ENCOUNTER — Ambulatory Visit (INDEPENDENT_AMBULATORY_CARE_PROVIDER_SITE_OTHER): Payer: PPO | Admitting: Physician Assistant

## 2018-01-19 ENCOUNTER — Encounter: Payer: Self-pay | Admitting: Physician Assistant

## 2018-01-19 ENCOUNTER — Other Ambulatory Visit: Payer: Self-pay

## 2018-01-19 VITALS — BP 128/90 | HR 72 | Temp 98.2°F | Resp 14 | Ht <= 58 in | Wt 177.6 lb

## 2018-01-19 DIAGNOSIS — K760 Fatty (change of) liver, not elsewhere classified: Secondary | ICD-10-CM

## 2018-01-19 DIAGNOSIS — I10 Essential (primary) hypertension: Secondary | ICD-10-CM | POA: Diagnosis not present

## 2018-01-19 DIAGNOSIS — M0579 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 without organ or systems involvement: Secondary | ICD-10-CM | POA: Diagnosis not present

## 2018-01-19 DIAGNOSIS — E785 Hyperlipidemia, unspecified: Secondary | ICD-10-CM | POA: Diagnosis not present

## 2018-01-19 DIAGNOSIS — R739 Hyperglycemia, unspecified: Secondary | ICD-10-CM | POA: Diagnosis not present

## 2018-01-19 DIAGNOSIS — K219 Gastro-esophageal reflux disease without esophagitis: Secondary | ICD-10-CM | POA: Diagnosis not present

## 2018-01-19 DIAGNOSIS — E041 Nontoxic single thyroid nodule: Secondary | ICD-10-CM | POA: Diagnosis not present

## 2018-01-19 MED ORDER — AMLODIPINE BESYLATE 5 MG PO TABS: 5.0000 mg | ORAL_TABLET | Freq: Every day | ORAL | 1 refills | Status: DC

## 2018-01-19 NOTE — Progress Notes (Signed)
Patient ID: [name] MRN: [id number], DOB: May 22, 1946, 72 y.o. Date of Encounter: @DATE @  Chief Complaint:  Chief Complaint  Patient presents with  . discuss blood pressure  . discuss cholesterol  . discuss diabetes    HPI: 72 y.o. year old white female  presents for routine followup office visit.     She had a lot of medical problems in 2014-2015.   Approx 06/2013 she had laparoscopic cholecystectomy.   Her next routine office visit with me was 11/21/2013. She had a long hospitalization---10/15/13-10/21/2013-- with symptoms of mostly vomiting. Many many tests are performed. The patient stated that no definitive diagnosis could ever be confirmed and it was felt that she just has some nonspecific virus.  She had another hospitalization  03/16/14 - 03/19/14. During that hospitalization, she was found to have a pulmonary nodule. Since then she has been having follow-up with Dr. Melvyn Novas.    05/22/2014 she reported that she has been feeling good and has gotten back to her walking. [name] that she walks with her granddaughter and her baby. Says  they  "walk whenever they can"-- which usually is about 3-5 times per week. At all of her visits with me since then, she has continued to tell me that she has continued to do these walks with her granddaughter and her baby whenever the weather is good. Again at her visit today 12/2014 she reports this.  01/09/2015: she says this is the same---still walking same amount. Still feeling good.  She is taking her blood pressure medications as directed but no adverse effects. No lower extremity edema and no lightheadedness. She is taking Lipitor at 20 mg. No myalgias or other adverse effects. Last A1C 10/10/14--A1C was down to 5.7. Metformin was discontinued completely. She says that she did stop this. She says that she has not been checking her blood sugar.Says that they did have a screening at her work and at that time her fasting sugar was 115.   Rheumatoid  arthritis(714.0) Per Dr. Estanislado Pandy ADDENDUM--ADDED 05/21/2015: I received office note from Dr. Estanislado Pandy dated 05/16/15. The following is copied from that [place] note: "Pt reports that she has been experiencing increased pain and discomfort in multiple joints. She has pain in her bilateral shoulders, bilateral wrist joints and hands. Tenderness on palpation over her MCPs and PIPs. Also has thickening of bilateral CMC joints due to underlying osteoarthritis. She has swelling and warmth over bilateral ankle joints. She appears to be having a flare of her rheumatoid arthritis. She is on high risk prescription, taking methotrexate 6 tablets per week, which is not working as well for her. She could not increase her dose of methotrexate any further. She does have osteoarthritis, which causes some discomfort also. Different treatment options and their side effects were discussed at length. Discussed option of using combination therapy and adding plaquenil to her regimen. She was prescribed Plaquenil 200 mg 1 by mouth daily 90 day supply with 1 refill. Was advised to get eye exams to monitor for ocular toxicity. Orders given for labs every 3 months to monitor drug toxicity. They also discussed that most likely she would have to get on more aggressive therapy than just methotrexate and Plaquenil in the future. To cover that she checked TB cold, HIV, SPEP, immunoglobulins. Dr. Estanislado Pandy noted she is anemic and told her to follow-up with her PCP regarding that.  07/14/2015: She states that she has not been doing her walking. Says that she's been hurting. Says that it just  hasn't worked out. Says she is feeling better with the plaquenil. She is taking all blood pressure medicines as directed. No lower extremity edema. No lightheadedness. She is taking her Lipitor. No myalgias or other adverse effects. She is trying to be careful with her carbohydrates.   ADDENDUM ADDED 12/08/2015: Received OV note from Dr. Estanislado Pandy  dated 12/02/2105. "She states she has been having increased pain and swelling in her hands. Due to her elevated creatinine, we had to decrease her methotrexate from 6 to 5 and now to 4 tablets per week and she states she has been having increased pain and swelling to the point she is having difficulty making a fist." "---She is taking Plaquenil 200mg  a day along with methotrexate, which is not controlling her disease. We had detailed discussion regarding her current situation. We will d/c methotrexate and start [place]. Continue the Plaquenil for now---"  01/12/2016: Says she is still having significant pain. Still unable to do her walking, exercise. She is taking all blood pressure medicines as directed. No lower extremity edema. No lightheadedness. She is taking her Lipitor. No myalgias or other adverse effects. She is trying to be careful with her carbohydrates.   Addendum Added 06/03/2016: Received OV note from Dr. Estanislado Pandy dated 05/25/2016. At that visit patient reported that she had been having flare of her arthritis.  She no longer felt like the Arava and Plaquenil were adequately addressing her rheumatoid arthritis.  At that visit they discussed Enbrel at length and the patient was agreeable to try biologic. Planned: Enbrel. If Enbrel fails, then try [place] while on Enbrel and stop the Plaquenil.  "Prescription for Pneumovax. Check with PCP when the last one was given and take if it is due" I reviewed her Immunizations---She received Prevnar 13---07/04/2013.                                              ----She received Pneumovax 23--06/23/2011, 08/02/2012   07/19/2016: She states that she thinks that the Enbrel is starting to work some. Says that her hands and other joints don't feel quite as stiff and feel like they are moving a little bit more. She states that she has had this bad cough for a couple weeks now. Says that about a month ago she had cold symptoms and stuffy  nose but then that got better but the cough has lingered and now the cough has been worse for about 1-1/2-2 weeks now. Says that her daughter lives with her---her daughter recently had community-acquired pneumonia.  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  She is taking blood pressure medications as directed. No lightheadedness or other adverse effects.  She is taking Lipitor as directed. No myalgias or other adverse effects.  She is being careful with her diet and following low carbohydrate diet. Exercise is limited secondary to her arthritis.   01/19/2017: She still sees Dr. Estanislado Pandy for her RA. Says that she has her on Enbrel now and it is working great. Says that she is walking 1-2 miles 3 days a week. Asked if she was sticking with low-carb diet. She says that she has been trying to cut back  on some things that she had been slacking off on. She is taking blood pressure medications as directed. No lightheadedness or other adverse effects. She is taking Lipitor as directed. No myalgias or other adverse effects. She is taking omeprazole for GERD and is taking her magnesium supplement. She has no specific complaints or concerns to address today.  07/21/2017: Today, she states that the Enbrel continues to work well and she is able to do her walking and be active. Today I noted that she still has a lesion on the top of her scalp though it does look like it may be smaller than what it was when she came for recent visit with me.  I asked her what dermatology told her.  Says that they told her that "it came back staph infection "asked about treatment.  She says that they applied some type of treatment to it there.  Says that they also gave her an ointment to apply.  Asked if she was given an antibiotic and she says that she was.   However she then shows me another area of "rash" on her left forearm that is new.  Also she pulls up her pants to show me the area on her left leg.  Asked if she has shown that to dermatology and so she says she did.  They had given her the ointment for her to put on it.  Not know the name of the medication.  Currently does not have follow-up appointment scheduled for dermatology. Continues to follow low carbohydrate diet and is able to do some activity now. She is taking blood pressure medications as directed. No lightheadedness or other adverse effects. She is taking Lipitor as directed. No myalgias or other adverse effects. She is taking omeprazole for GERD and is taking her magnesium supplement.   01/19/2018: Today she states that she has been doing pretty well.  No specific concerns.  Is that with this recent heat she has not necessarily gone out and walked but has definitely stayed active with grandkids etc. Today I did review with her the lab results from last visit which was 07/21/2017. That time A1c came back 7.2 so I recommended to restart the metformin at 500 mg twice daily.  Today she reports that you she did restart that and is taking it twice daily as directed.  Having no adverse effects. Also reviewed that her magnesium level came back low and that I had made a note for them to find out the dosage that she had was on but see no additional notes regarding that.  Reviewed medication list which states that she is on Mag-Ox 400 mg twice daily.  She states that that is the correct dosing. Did with her that the cholesterol levels were excellent at that time and to continue the same cholesterol medicines and she has continued those.  Causing no myalgias or other adverse effects. Is taking her blood pressure medications as directed.  Having no lightheadedness or other adverse effects. Is taking the omeprazole for GERD and is taking the magnesium supplement. No other concerns to address  today.     Past Medical History:  Diagnosis Date  . Arthritis    osteoarthritis  . Bursitis of right shoulder   . Chronic cholecystitis with calculus 05/04/2013  . 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 Diabetes type 2, controlled ([place]) 05/04/2013  . Fatty liver disease, nonalcoholic 8338   hospitalized, MRCP dx fatty  liver  . GERD (gastroesophageal reflux disease)   . Hormone replacement therapy (postmenopausal)   . Hyperlipidemia   . Hypertension   . Normal cardiac stress test    pt can't remember when or where  . Obesity, unspecified 12/27/2012  . Rheumatoid arthritis(714.0) 09/28/2012  . 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 Unspecified essential hypertension 05/04/2013     Home Meds: Outpatient Medications Prior to Visit  Medication Sig Dispense Refill  . amLODipine (NORVASC) 5 MG tablet TAKE 1 TABLET DAILY 90 tablet 1  . aspirin EC 81 MG tablet Take 81 mg by mouth daily.    Marland Kitchen atorvastatin (LIPITOR) 10 MG tablet TAKE 1 TABLET DAILY (NEW DOSE) 90 tablet 1  . Calcium Carbonate-Vitamin D (CALCIUM 600 + D PO) Take 1 tablet by mouth 2 (two) times daily.    . cloNIDine (CATAPRES) 0.3 MG tablet Take 1 tablet (0.3 mg total) by mouth 2 (two) times daily. 180 tablet 1  . cyanocobalamin 500 MCG tablet Take 1 tablet (500 mcg total) by mouth daily. 30 tablet 0  . diclofenac sodium (VOLTAREN) 1 % GEL Apply 3 g topically daily as needed (pain). To 3 large joints    . ENBREL SURECLICK 50 MG/ML injection INJECT 50 MG (0.98 ML) UNDER THE SKIN ONCE A WEEK 3.92 mL 2  . estradiol (ESTRACE) 0.5 MG tablet Take 0.5 mg by mouth 2 (two) times daily.    . Ferrous Sulfate (IRON) 325 (65 Fe) MG TABS Take by mouth daily.    . fish oil-omega-3 fatty acids 1000 MG capsule Take 1 g by mouth at bedtime.     . folic acid (FOLVITE) 1 MG tablet TAKE 1 TABLET BY MOUTH TWICE A DAY 90 tablet 3  . HYDROcodone-homatropine (HYCODAN) 5-1.5 MG/5ML syrup Take 5 mLs by mouth  every 6 (six) hours as needed. 120 mL 0  . leflunomide (ARAVA) 10 MG tablet TAKE 1 TABLET BY MOUTH EVERY DAY 90 tablet 0  . losartan-hydrochlorothiazide (HYZAAR) 100-12.5 MG tablet Take 1 tablet by mouth daily. 90 tablet 1  . magnesium oxide (MAG-OX) 400 MG tablet TAKE 1 TABLET BY MOUTH TWICE A DAY 60 tablet 3  . metFORMIN (GLUCOPHAGE) 500 MG tablet Take 1 tablet (500 mg total) by mouth 2 (two) times daily with a meal. 60 tablet 3  . metoprolol tartrate (LOPRESSOR) 25 MG tablet TAKE 1/2 TABLET TWICE A DAY 90 tablet 0  . Multiple Vitamins-Minerals (MULTIVITAMINS THER. W/MINERALS) TABS Take 1 tablet by mouth daily.    Marland Kitchen omeprazole (PRILOSEC) 20 MG capsule TAKE 1 CAPSULE BY MOUTH EVERY DAY 90 capsule 3  . sulfamethoxazole-trimethoprim (BACTRIM DS,SEPTRA DS) 800-160 MG tablet Take 1 tablet by mouth 2 (two) times daily. 20 tablet 0   No facility-administered medications prior to visit.     Allergies:  Allergies  Allergen Reactions  . Monopril [Fosinopril] Cough  . Norvasc [Amlodipine Besylate] Other (See Comments)    10 mg dose causes LE edema.  Tolerates 5mg  dose.    Social History   Socioeconomic History  . Marital status: Divorced    Spouse name: Not on file  . Number of children: Not on file  . Years of education: Not on file  . Highest education level: Not on file  Occupational History  . Not on file  Social Needs  . Financial resource strain: Not on file  . Food insecurity:    Worry: Not on file    Inability: Not on file  . Transportation needs:  Medical: Not on file    Non-medical: Not on file  Tobacco Use  . Smoking status: Former Smoker    Packs/day: 0.25    Years: 2.00    Pack years: 0.50    Types: Cigarettes    Last attempt to quit: 11/21/2013    Years since quitting: 4.1  . Smokeless tobacco: Never Used  . Tobacco comment: smoked regulary for approx 10 yrs- stopped in her 59's- but still smokes occ cig/Aug 2015  Substance and Sexual Activity  . Alcohol use:  Yes    Comment: occasional  . Drug use: No  . Sexual activity: Never  Lifestyle  . Physical activity:    Days per week: Not on file    Minutes per session: Not on file  . Stress: Not on file  Relationships  . Social connections:    Talks on phone: Not on file    Gets together: Not on file    Attends religious service: Not on file    Active member of club or organization: Not on file    Attends meetings of clubs or organizations: Not on file    Relationship status: Not on file  . Intimate partner violence:    Fear of current or ex partner: Not on file    Emotionally abused: Not on file    Physically abused: Not on file    Forced sexual activity: Not on file  Other Topics Concern  . Not on file  Social History Narrative  . Not on file    Family History  Problem Relation Age of Onset  . Cancer Mother        breast  . Heart attack Mother   . Stroke Father   . Diabetes Sister   . Diabetes Daughter      Review of Systems:  See HPI for pertinent ROS. All other ROS negative.     Physical Exam: Blood pressure 128/90, pulse 72, temperature 98.2 F (36.8 C), temperature source Oral, resp. rate 14, height 4\' 10"  (1.473 m), weight 80.6 kg (177 lb 9.6 oz), SpO2 97 %., Body mass index is 37.12 kg/m. General: Obese WF. Appears in no acute distress. Neck: Supple. No thyromegaly. No lymphadenopathy. No carotid bruits. Lungs: Clear bilaterally to auscultation without wheezes, rales, or rhonchi. Breathing is unlabored. Heart: RRR with S1 S2. No murmurs, rubs, or gallops. Abdomen: Soft, non-tender, non-distended with normoactive bowel sounds. No hepatomegaly. No rebound/guarding. No obvious abdominal masses. Musculoskeletal:  Strength and tone normal for age. Extremities/Skin: Warm and dry.  No LE edema.  Neuro: Alert and oriented X 3. Moves all extremities spontaneously. Gait is normal. CNII-XII grossly in tact. Psych:  Responds to questions appropriately with a normal affect.      ASSESSMENT AND PLAN:  72 y.o. year old female with     Hypomagnesemia 07/21/2017: In past--checked Mg b/c she was on PPI---Mg was low--she has been on supplement---recheck now. 01/19/2018: Last lab magnesium level was still low.  Today she is pretty sure she is on Mag-Ox 400 mg twice daily--she is quite certain that is the current dose.  Recheck magnesium level today and then adjust dose accordingly. - Magnesium   Prior h/o Diabetes type 2, controlled-----no longer requiring meds---controlled with diet, exercise, monitoring A1C  She is on aspirin 81 mg a day. She is on ARB. She is intolerant to ACE inhibitors secondary to cough. She is on statin. LDL has been at goal of less than 70.  She saw Ophthalmologist for  diabetic eye exam in February 2017---"he says everything looks good" Foot exam is normal. She reports that she has needed no neuropathy symptoms.  01/19/2018:  She had required medications to control blood sugar in the past.  Until 12/2014, she had been having office as its with me every 3 months because she had been requiring diabetic medications. However at that time she was able to come off of diabetic medications.                      Then she had gotten to the point that she was no longer requiring medications.     This was being controlled with diet and exercise.     However at the time of her lab 07/21/2017 A1c came back at 7.2.     At that time it started back on metformin 500 mg twice daily.     At today's visit she confirms that she is taking this medication at this dose.     Will recheck A1c today to monitor.   Essential hypertension,  01/19/2018: : At goal/controlled.   On ARB. Intolerant to ACE inhibitor.   Continue current medications. Check lab to monitor.   Hyperlipidemia On Lipitor.At Lab Result 05/22/14--decreased Lipitor dose to 20 mg . F/U  FLP 07/10/14---FLP Excellent.  Has remained well controlled at that dose since 01/19/2018: -----Recheck labs  again today to monitor. She is fasting.   Fatty liver disease, nonalcoholic 04/18/785:  LFTs have been stable. Recheck LFTs now  Anemia, unspecified anemia type - CBC was done 05/22/14 and was stable.  Hormone replacement therapy (postmenopausal) 01/19/2018: Per Gyn  Rheumatoid arthritis(714.0) 01/19/2018: Per Dr. Estanislado Pandy  ADDENDUM--ADDED 05/21/2015: I received office note from Dr. Estanislado Pandy dated 05/16/15. The following is copied from that [place] note: "Pt reports that she has been experiencing increased pain and discomfort in multiple joints. She has pain in her bilateral shoulders, bilateral wrist joints and hands. Tenderness on palpation over her MCPs and PIPs. Also has thickening of bilateral CMC joints due to underlying osteoarthritis. She has swelling and warmth over bilateral ankle joints. She appears to be having a flare of her rheumatoid arthritis. She is on high risk prescription, taking methotrexate 6 tablets per week, which is not working as well for her. She could not increase her dose of methotrexate any further. She does have osteoarthritis, which causes some discomfort also. Different treatment options and their side effects were discussed at length. Discussed option of using combination therapy and adding plaquenil to her regimen. She was prescribed Plaquenil 200 mg 1 by mouth daily 90 day supply with 1 refill. Was advised to get eye exams to monitor for ocular toxicity. Orders given for labs every 3 months to monitor drug toxicity. They also discussed that most likely she would have to get on more aggressive therapy than just methotrexate and Plaquenil in the future. To cover that she checked TB cold, HIV, SPEP, immunoglobulins. Dr. Estanislado Pandy noted she is anemic and told her to follow-up with her PCP regarding that.  ADDENDUM ADDED 12/08/2015: Received OV note from Dr. Estanislado Pandy dated 12/02/2105. "She states she has been having increased pain and swelling in her hands. Due to her  elevated creatinine, we had to decrease her methotrexate from 6 to 5 and now to 4 tablets per week and she states she has been having increased pain and swelling to the point she is having difficulty making a fist." "---She is taking Plaquenil 200mg  a day along with  methotrexate, which is not controlling her disease. We had detailed discussion regarding her current situation. We will d/c methotrexate and start [place]. Continue the Plaquenil for now---"  Gastroesophageal reflux disease, esophagitis presence not specified 01/19/2018: : This is controlled with current medication.  Multiple pulmonary nodules She has been following up with Dr. Melvyn Novas at pulmonary. At [facility] 05/22/14 she said that she is scheduled for "another scan in November". At [facility] 07/10/14--she had f/u scan November. Says there was no change and she is to f/u 6 months.  --Says at last check, no change. Dr. Melvyn Novas told her can wait 2 years to repeat scan. 01/19/2017:Reminded her to f/u with Dr. Minna Merritts  Preventive [facility]   Colonoscopy: 04/30/2011 colon polyp. She says she was told to repeat 10 years.    Pap smear, mammogram, and BMD : Sees GYN annually and may manage this.  At [facility] 11/21/2013 she reported that they do the mammograms there in the gynecology office itself. [name] this is at [facility]. I had her sign a release that day so that we could get those records to enter into our abstraction for quality metrics.  07/21/2017: Reports that she does have regular routine mammograms performed at her gynecologist Dr. Jadene Pierini.  States that she just recently had visit there.  Addendum Added 02/17/2017----Received Bone Density Report from [facility]--Test performed 01/26/2017 T scores were +0.20, -1.80, -1.50, -1.20, -1.40, - 0.6, -0.5, considered osteopenia. She is to continue calcium and vitamin D and weightbearing exercise.   Immunizations:  Tdap: 05/2011  Pneumovax: 05/2011  Prevnar 13: 07/04/2013  Influenza vaccine she did receive this in  the fall of 2014. Says she received influenza vaccine at work for free ---to cover 2015-2016 Flu Season. Received 05/2015/ Received 2017 Zostavax: 09/2011  Until 12/2014, she had been having office as its with me every 3 months because she had been requiring diabetic medications. However at that time she was able to come off of diabetic medications. At that time, it was decided she can wait 6 months to have follow-up visit. However I did tell her to occasionally check her blood sugar just to make sure that it is not starting to drift upward. Routine followup office visit in 6 months or sooner if needed.    Signed, [address] [place], [place], [facility] 01/19/2018 9:21 AM

## 2018-01-20 ENCOUNTER — Other Ambulatory Visit: Payer: Self-pay

## 2018-01-20 LAB — COMPLETE METABOLIC PANEL WITH GFR
AG RATIO: 1.4 (calc) (ref 1.0–2.5)
ALBUMIN MSPROF: 3.7 g/dL (ref 3.6–5.1)
ALT: 18 U/L (ref 6–29)
AST: 25 U/L (ref 10–35)
Alkaline phosphatase (APISO): 71 U/L (ref 33–130)
BUN: 16 mg/dL (ref 7–25)
CALCIUM: 9.3 mg/dL (ref 8.6–10.4)
CO2: 24 mmol/L (ref 20–32)
Chloride: 105 mmol/L (ref 98–110)
Creat: 0.77 mg/dL (ref 0.60–0.93)
GFR, EST AFRICAN AMERICAN: 89 mL/min/{1.73_m2} (ref >=60)
GFR, EST NON AFRICAN AMERICAN: 77 mL/min/{1.73_m2} (ref >=60)
GLOBULIN: 2.6 g/dL (ref 1.9–3.7)
Glucose, Bld: 129 mg/dL — ABNORMAL HIGH (ref 65–99)
POTASSIUM: 3.7 mmol/L (ref 3.5–5.3)
SODIUM: 143 mmol/L (ref 135–146)
TOTAL PROTEIN: 6.3 g/dL (ref 6.1–8.1)
Total Bilirubin: 0.5 mg/dL (ref 0.2–1.2)

## 2018-01-20 LAB — LIPID PANEL
Cholesterol: 187 mg/dL (ref <200)
HDL: 79 mg/dL (ref >50)
LDL Cholesterol (Calc): 88 mg/dL (calc)
NON-HDL CHOLESTEROL (CALC): 108 mg/dL (ref <130)
Total CHOL/HDL Ratio: 2.4 (calc) (ref <5.0)
Triglycerides: 103 mg/dL (ref <150)

## 2018-01-20 LAB — MAGNESIUM: MAGNESIUM: 1.3 mg/dL — AB (ref 1.5–2.5)

## 2018-01-20 LAB — MICROALBUMIN, URINE: Microalb, Ur: 33.4 mg/dL

## 2018-01-20 LAB — HEMOGLOBIN A1C
HEMOGLOBIN A1C: 6.9 %{Hb} — AB (ref <5.7)
Mean Plasma Glucose: 151 (calc)
eAG (mmol/L): 8.4 (calc)

## 2018-01-20 MED ORDER — MAGNESIUM OXIDE 400 MG PO TABS: ORAL_TABLET | ORAL | 3 refills | Status: DC

## 2018-01-31 ENCOUNTER — Other Ambulatory Visit: Payer: Self-pay | Admitting: Physician Assistant

## 2018-02-07 ENCOUNTER — Telehealth: Payer: Self-pay | Admitting: Rheumatology

## 2018-02-07 ENCOUNTER — Other Ambulatory Visit: Payer: Self-pay | Admitting: *Deleted

## 2018-02-07 NOTE — Telephone Encounter (Signed)
Prescription has been faxed to [facility].

## 2018-02-07 NOTE — Telephone Encounter (Signed)
Patient called stating she received a call from [facility] regarding her Enbrel and they stated verbal orders can be called into [phone].  Patient states she only has one shot remaining.

## 2018-02-07 NOTE — Telephone Encounter (Signed)
Refill request received for Enbrel   Last visit: 11/22/17 Next Visit: 04/26/18 Labs: 11/25/17 Within normal limits except glucose is elevated TB [place]: 05/17/18 Neg   Okay to refill per Dr. Estanislado Pandy   Will fax to [facility]

## 2018-02-08 ENCOUNTER — Other Ambulatory Visit: Payer: Self-pay | Admitting: Physician Assistant

## 2018-02-14 ENCOUNTER — Other Ambulatory Visit: Payer: Self-pay | Admitting: Physician Assistant

## 2018-02-26 ENCOUNTER — Other Ambulatory Visit: Payer: Self-pay | Admitting: Rheumatology

## 2018-02-27 NOTE — Telephone Encounter (Signed)
Last visit: 11/22/2017 Next visit: 04/26/2018 Labs: 11/25/2017 elevated glucose.   Left message on machine to advise patient she is due to update labs.   Okay to refill per Dr. Estanislado Pandy.

## 2018-04-05 ENCOUNTER — Other Ambulatory Visit: Payer: Self-pay | Admitting: Physician Assistant

## 2018-04-12 NOTE — Progress Notes (Signed)
Office Visit Note  Patient: [name]             Date of Birth: 1945-09-02           MRN: [id number]             PCP: Rennis Golden Referring: Orlena Sheldon, PA-C Visit Date: 04/26/2018 Occupation: @GUAROCC @  Subjective:  Right 4th and 5th toe numbness   History of Present Illness: Earsie Humm is a 72 y.o. female with history of seropositive rheumatoid arthritis and osteoarthritis. Patient is on Enbrel sq injections every week and Arava 10 mg po daily. She has not missed any doses recently.  She denies any recent RA flares.  She reports occasional discomfort in bilateral CMC joints.  She reports bilateral knee replacements are doing well.  She reports that recently she has noticed numbness and tingling in the right 4th and 5th toes on the plantar aspect.  She denies any joint pain or joint swelling.  She states she was evaluated by PCP but did not have a diabetic foot exam at that time.  She does not have a known history of neuropathy and has not been evaluated by a neurologist. She denies any ankle joint pain.  She reports that her right shoulder pain resolved following the cortisone injection at her last visit in April 2019.  She states bilateral knee replacements are doing well.   Activities of Daily Living:  Patient reports morning stiffness for 2 minutes.   Patient Denies nocturnal pain.  Difficulty dressing/grooming: Denies Difficulty climbing stairs: Reports Difficulty getting out of chair: Denies Difficulty using hands for taps, buttons, cutlery, and/or writing: Denies  Review of Systems  Constitutional: Positive for fatigue.  HENT: Positive for mouth dryness. Negative for mouth sores and nose dryness.   Eyes: Negative for pain, visual disturbance and dryness.  Respiratory: Negative for cough, hemoptysis, shortness of breath and difficulty breathing.   Cardiovascular: Negative for chest pain, palpitations, hypertension and swelling in legs/feet.  Gastrointestinal:  Negative for blood in stool, constipation and diarrhea.  Endocrine: Negative for increased urination.  Genitourinary: Negative for painful urination.  Musculoskeletal: Positive for arthralgias, joint pain and morning stiffness. Negative for joint swelling, myalgias, muscle weakness, muscle tenderness and myalgias.  Skin: Negative for color change, pallor, rash, hair loss, nodules/bumps, skin tightness, ulcers and sensitivity to sunlight.  Allergic/Immunologic: Negative for susceptible to infections.  Neurological: Negative for dizziness, numbness, headaches and weakness.  Hematological: Negative for swollen glands.  Psychiatric/Behavioral: Positive for sleep disturbance. Negative for depressed mood. The patient is not nervous/anxious.     PMFS History:  Patient Active Problem List   Diagnosis Date Noted  . Diabetes mellitus type 2, uncomplicated ([place]) 01/65/5374  . History of total bilateral knee replacement 06/22/2017  . History of hypertension 08/20/2016  . High risk medication use 08/20/2016  . Primary osteoarthritis of both hands 08/20/2016  . Hypomagnesemia 07/21/2016  . Left thyroid nodule 03/05/2016  . Diabetic eye exam ([place]) 10/15/2014  . Multiple pulmonary nodules 04/04/2014  . Malnutrition of moderate degree ([place]) 03/18/2014  . Sepsis ([place]) 03/16/2014  . Anemia 11/21/2013  . 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 Bursitis of right shoulder   . GERD (gastroesophageal reflux disease)   . Morbid obesity due to excess calories ([place]) 12/27/2012  . 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 without organ or systems involvement ([place])   . Essential hypertension, benign 10/14/2012  . Hyperlipidemia   . Hormone replacement therapy (postmenopausal)   .  Fatty liver disease, nonalcoholic   . Osteoarthritis of right knee 08/05/2012    Past Medical History:  Diagnosis Date  . Arthritis    osteoarthritis  . Bursitis of right shoulder   . Chronic cholecystitis with  calculus 05/04/2013  . 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 Diabetes type 2, controlled ([place]) 05/04/2013  . Fatty liver disease, nonalcoholic 7253   hospitalized, MRCP dx fatty liver  . GERD (gastroesophageal reflux disease)   . Hormone replacement therapy (postmenopausal)   . Hyperlipidemia   . Hypertension   . Normal cardiac stress test    pt can't remember when or where  . Obesity, unspecified 12/27/2012  . Rheumatoid arthritis(714.0) 09/28/2012  . 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 Unspecified essential hypertension 05/04/2013    Family History  Problem Relation Age of Onset  . Cancer Mother        breast  . Heart attack Mother   . Stroke Father   . Diabetes Sister   . Diabetes Daughter    Past Surgical History:  Procedure Laterality Date  . ABDOMINAL HYSTERECTOMY    . CHOLECYSTECTOMY N/A 05/04/2013   Procedure: LAPAROSCOPIC CHOLECYSTECTOMY WITH INTRAOPERATIVE CHOLANGIOGRAM;  Surgeon: Haywood Lasso, MD;  Location: [facility];  Service: General;  Laterality: N/A;  . ERCP N/A 05/02/2013   Procedure: ENDOSCOPIC RETROGRADE CHOLANGIOPANCREATOGRAPHY (ERCP);  Surgeon: Beryle Beams, MD;  Location: [facility];  Service: Endoscopy;  Laterality: N/A;  . JOINT REPLACEMENT     left knee  . NASAL SINUS SURGERY    . SPHINCTEROTOMY  05/02/2013   Procedure: SPHINCTEROTOMY;  Surgeon: Beryle Beams, MD;  Location: [facility];  Service: Endoscopy;;  . TONSILLECTOMY    . TOTAL KNEE ARTHROPLASTY  08/01/2012   Procedure: TOTAL KNEE ARTHROPLASTY;  Surgeon: Meredith Pel, MD;  Location: [place];  Service: Orthopedics;  Laterality: Right;  Right total knee arthroplasty  . TOTAL KNEE ARTHROPLASTY  08/01/2012   RIGHT  KNEE   Social History   Social History Narrative  . Not on file    Objective: Vital Signs: BP (!) 156/80 (BP Location: Left Arm, Patient Position: Sitting, Cuff Size: Normal)   Pulse 78   Resp 16   Ht 4\' 10"  (1.473 m)    Wt 184 lb (83.5 kg)   BMI 38.46 kg/m    Physical Exam  Constitutional: She is oriented to person, place, and time. She appears well-developed and well-nourished.  HENT:  Head: Normocephalic and atraumatic.  Eyes: Conjunctivae and EOM are normal.  Neck: Normal range of motion.  Cardiovascular: Normal rate, regular rhythm, normal heart sounds and intact distal pulses.  Pulmonary/Chest: Effort normal and breath sounds normal.  Abdominal: Soft. Bowel sounds are normal.  Lymphadenopathy:    She has no cervical adenopathy.  Neurological: She is alert and oriented to person, place, and time.  Skin: Skin is warm and dry. Capillary refill takes less than 2 seconds.  Psychiatric: She has a normal mood and affect. Her behavior is normal.  Nursing note and vitals reviewed.    Musculoskeletal Exam: C-spine, thoracic spine, lumbar spine good range of motion.  No midline spinal tenderness.  No SI joint tenderness.  Shoulder joints, elbow joints and wrist joints comes and MCPs, PIPs, DIPs good range of motion no synovitis.  She has PIP and DIP synovial thickening consistent with osteoarthritis of bilateral hands.  She has bilateral CMC joint synovial thickening.  Hips good range  of motion with no discomfort.  Bilateral knee replacements doing well with no warmth or effusion noted.  Pedal edema bilaterally.  No tenderness of MTP or ankle joints on exam.  CDAI Exam: CDAI Score: 0.4  Patient Global Assessment: 2 (mm); Provider Global Assessment: 2 (mm) Swollen: 0 ; Tender: 0  Joint Exam   Not documented   There is currently no information documented on the homunculus. Go to the Rheumatology activity and complete the homunculus joint exam.  Investigation: No additional findings.  Imaging: No results found.  Recent Labs: Lab Results  Component Value Date   WBC 4.7 04/21/2018   HGB 13.4 04/21/2018   PLT 145 04/21/2018   NA 138 04/21/2018   K 3.5 04/21/2018   CL 99 04/21/2018   CO2 25  04/21/2018   GLUCOSE 206 (H) 04/21/2018   BUN 19 04/21/2018   CREATININE 1.02 (H) 04/21/2018   BILITOT 0.4 04/21/2018   ALKPHOS 81 01/19/2017   AST 29 04/21/2018   ALT 21 04/21/2018   PROT 6.6 04/21/2018   ALBUMIN 3.6 01/19/2017   CALCIUM 9.8 04/21/2018   GFRAA 63 04/21/2018   QFTBGOLD NEGATIVE 05/17/2017   QFTBGOLDPLUS NEGATIVE 04/21/2018    Speciality Comments: No specialty comments available.  Procedures:  No procedures performed Allergies: Monopril [fosinopril] and Norvasc [amlodipine besylate]   Assessment / Plan:     Visit Diagnoses: 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 without organ or systems involvement ([place]) - +RF, +CCP, -ANA: She has no synovitis on exam.  She is clinically doing well on Enbrel 50 mg subcutaneous injections once weekly and Arava 10 mg by mouth daily.  She has not had any recent rheumatoid arthritis flares.  She has no joint pain or joint swelling at this time.  She will continue on this current treatment regimen.  She does not need any refills at this time.  She had lab work performed on 04/21/2018.  She will return to the office in 5 months.  She was advised to notify us if she develops increased joint pain or joint swelling.  High risk medication use - Enbrel 50 mg subcutaneous every week(06/25/2016), Arava 10 mg by mouth daily.  CBC and CMP are drawn on 04/21/2018.  TB gold negative on 04/21/2018.  She will return in November and every 3 months for lab work to monitor for drug toxicity.  Future orders are in place.  Primary osteoarthritis of both hands: She has PIP and DIP synovial thickening consistent with osteoarthritis of bilateral hands.  She has CMC joint synovial thickening.  She has intermittent discomfort of bilateral CMC joints.  She has no tenderness on exam today.  Joint protection and muscle strengthening were discussed.  History of total bilateral knee replacement - Right= 2013. Left=2003.  Doing well.  Good range of  motion.  No warmth or effusion noted.  Paresthesia of right foot: She has diminished sensation on the plantar aspect of her right fourth and fifth toes.  No synovitis or tenderness was noted.  Due to her history of diabetes it was advised that she follow-up with her PCP for diabetic foot exam.  She should be evaluated for neuropathy.  Other medical conditions are listed as follows:  History of diabetes mellitus  History of obesity  History of hypertension  History of hyperlipidemia  History of cholecystectomy  Multiple pulmonary nodules  History of gastroesophageal reflux (GERD)   Orders: No orders of the defined types were placed in this encounter.  No orders  of the defined types were placed in this encounter.    Follow-Up Instructions: Return in about 5 months (around 09/26/2018) for Rheumatoid arthritis, Osteoarthritis.   Ofilia Neas, PA-C   I examined and evaluated the patient with Hazel Sams PA.  Patient has been complaining of numbness in her right fourth and fifth toe.  She had no synovitis on examination.  No tenderness on palpation.  She had no discomfort range of motion of her lumbar segment.  There is no history of lower back pain.  The plan of care was discussed as noted above.  Bo Merino, MD  Note - This record has been created using Editor, commissioning.  Chart creation errors have been sought, but may not always  have been located. Such creation errors do not reflect on  the standard of medical care.

## 2018-04-20 ENCOUNTER — Ambulatory Visit (INDEPENDENT_AMBULATORY_CARE_PROVIDER_SITE_OTHER): Payer: PPO | Admitting: Physician Assistant

## 2018-04-20 ENCOUNTER — Encounter: Payer: Self-pay | Admitting: Physician Assistant

## 2018-04-20 ENCOUNTER — Other Ambulatory Visit: Payer: Self-pay

## 2018-04-20 VITALS — BP 148/80 | HR 60 | Temp 98.0°F | Resp 14 | Ht <= 58 in | Wt 181.2 lb

## 2018-04-20 DIAGNOSIS — M0579 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 without organ or systems involvement: Secondary | ICD-10-CM | POA: Diagnosis not present

## 2018-04-20 DIAGNOSIS — E119 Type 2 diabetes mellitus without complications: Secondary | ICD-10-CM | POA: Diagnosis not present

## 2018-04-20 DIAGNOSIS — I10 Essential (primary) hypertension: Secondary | ICD-10-CM | POA: Diagnosis not present

## 2018-04-20 DIAGNOSIS — K760 Fatty (change of) liver, not elsewhere classified: Secondary | ICD-10-CM | POA: Diagnosis not present

## 2018-04-20 DIAGNOSIS — E785 Hyperlipidemia, unspecified: Secondary | ICD-10-CM

## 2018-04-20 DIAGNOSIS — K219 Gastro-esophageal reflux disease without esophagitis: Secondary | ICD-10-CM | POA: Diagnosis not present

## 2018-04-20 NOTE — Progress Notes (Signed)
Patient ID: [name] MRN: [id number], DOB: 03-30-1946, 72 y.o. Date of Encounter: @DATE @  Chief Complaint:  Chief Complaint  Patient presents with  . 3 month follow up  . Hyperglycemia  . Hypertension    HPI: 72 y.o. year old white female  presents for routine followup office visit.     She had a lot of medical problems in 2014-2015.   Approx 06/2013 she had laparoscopic cholecystectomy.   Her next routine office visit with me was 11/21/2013. She had a long hospitalization---10/15/13-10/21/2013-- with symptoms of mostly vomiting. Many many tests are performed. The patient stated that no definitive diagnosis could ever be confirmed and it was felt that she just has some nonspecific virus.  She had another hospitalization  03/16/14 - 03/19/14. During that hospitalization, she was found to have a pulmonary nodule. Since then she has been having follow-up with Dr. Melvyn Novas.    05/22/2014 she reported that she has been feeling good and has gotten back to her walking. [name] that she walks with her granddaughter and her baby. Says  they  "walk whenever they can"-- which usually is about 3-5 times per week. At all of her visits with me since then, she has continued to tell me that she has continued to do these walks with her granddaughter and her baby whenever the weather is good. Again at her visit today 12/2014 she reports this.  74/19/2016: she says this is the same---still walking same amount. Still feeling good.  She is taking her blood pressure medications as directed but no adverse effects. No lower extremity edema and no lightheadedness. She is taking Lipitor at 20 mg. No myalgias or other adverse effects. Last A1C 10/10/14--A1C was down to 5.7. Metformin was discontinued completely. She says that she did stop this. She says that she has not been checking her blood sugar.Says that they did have a screening at her work and at that time her fasting sugar was 115.   Rheumatoid  arthritis(714.0) Per Dr. Estanislado Pandy ADDENDUM--ADDED 05/21/2015: I received office note from Dr. Estanislado Pandy dated 05/16/15. The following is copied from that [place] note: "Pt reports that she has been experiencing increased pain and discomfort in multiple joints. She has pain in her bilateral shoulders, bilateral wrist joints and hands. Tenderness on palpation over her MCPs and PIPs. Also has thickening of bilateral CMC joints due to underlying osteoarthritis. She has swelling and warmth over bilateral ankle joints. She appears to be having a flare of her rheumatoid arthritis. She is on high risk prescription, taking methotrexate 6 tablets per week, which is not working as well for her. She could not increase her dose of methotrexate any further. She does have osteoarthritis, which causes some discomfort also. Different treatment options and their side effects were discussed at length. Discussed option of using combination therapy and adding plaquenil to her regimen. She was prescribed Plaquenil 200 mg 1 by mouth daily 90 day supply with 1 refill. Was advised to get eye exams to monitor for ocular toxicity. Orders given for labs every 3 months to monitor drug toxicity. They also discussed that most likely she would have to get on more aggressive therapy than just methotrexate and Plaquenil in the future. To cover that she checked TB cold, HIV, SPEP, immunoglobulins. Dr. Estanislado Pandy noted she is anemic and told her to follow-up with her PCP regarding that.  74/21/2016: She states that she has not been doing her walking. Says that she's been hurting. Says that it just hasn't  worked out. Says she is feeling better with the plaquenil. She is taking all blood pressure medicines as directed. No lower extremity edema. No lightheadedness. She is taking her Lipitor. No myalgias or other adverse effects. She is trying to be careful with her carbohydrates.   ADDENDUM ADDED 12/08/2015: Received OV note from Dr. Estanislado Pandy  dated 12/02/2105. "She states she has been having increased pain and swelling in her hands. Due to her elevated creatinine, we had to decrease her methotrexate from 6 to 5 and now to 4 tablets per week and she states she has been having increased pain and swelling to the point she is having difficulty making a fist." "---She is taking Plaquenil 200mg  a day along with methotrexate, which is not controlling her disease. We had detailed discussion regarding her current situation. We will d/c methotrexate and start [place]. Continue the Plaquenil for now---"  74/22/2017: Says she is still having significant pain. Still unable to do her walking, exercise. She is taking all blood pressure medicines as directed. No lower extremity edema. No lightheadedness. She is taking her Lipitor. No myalgias or other adverse effects. She is trying to be careful with her carbohydrates.   Addendum Added 06/03/2016: Received OV note from Dr. Estanislado Pandy dated 05/25/2016. At that visit patient reported that she had been having flare of her arthritis.  She no longer felt like the Arava and Plaquenil were adequately addressing her rheumatoid arthritis.  At that visit they discussed Enbrel at length and the patient was agreeable to try biologic. Planned: Enbrel. If Enbrel fails, then try [place] while on Enbrel and stop the Plaquenil.  "Prescription for Pneumovax. Check with PCP when the last one was given and take if it is due" I reviewed her Immunizations---She received Prevnar 13---07/04/2013.                                              ----She received Pneumovax 23--06/23/2011, 08/02/2012   74/27/2017: She states that she thinks that the Enbrel is starting to work some. Says that her hands and other joints don't feel quite as stiff and feel like they are moving a little bit more. She states that she has had this bad cough for a couple weeks now. Says that about a month ago she had cold symptoms and stuffy  nose but then that got better but the cough has lingered and now the cough has been worse for about 1-1/2-2 weeks now. Says that her daughter lives with her---her daughter recently had community-acquired pneumonia.  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  She is taking blood pressure medications as directed. No lightheadedness or other adverse effects.  She is taking Lipitor as directed. No myalgias or other adverse effects.  She is being careful with her diet and following low carbohydrate diet. Exercise is limited secondary to her arthritis.   01/19/2017: She still sees Dr. Estanislado Pandy for her RA. Says that she has her on Enbrel now and it is working great. Says that she is walking 1-2 miles 3 days a week. Asked if she was sticking with low-carb diet. She says that she has been trying to cut back on  some things that she had been slacking off on. She is taking blood pressure medications as directed. No lightheadedness or other adverse effects. She is taking Lipitor as directed. No myalgias or other adverse effects. She is taking omeprazole for GERD and is taking her magnesium supplement. She has no specific complaints or concerns to address today.  07/21/2017: Today, she states that the Enbrel continues to work well and she is able to do her walking and be active. Today I noted that she still has a lesion on the top of her scalp though it does look like it may be smaller than what it was when she came for recent visit with me.  I asked her what dermatology told her.  Says that they told her that "it came back staph infection "asked about treatment.  She says that they applied some type of treatment to it there.  Says that they also gave her an ointment to apply.  Asked if she was given an antibiotic and she says that she was.   However she then shows me another area of "rash" on her left forearm that is new.  Also she pulls up her pants to show me the area on her left leg.  Asked if she has shown that to dermatology and so she says she did.  They had given her the ointment for her to put on it.  Not know the name of the medication.  Currently does not have follow-up appointment scheduled for dermatology. Continues to follow low carbohydrate diet and is able to do some activity now. She is taking blood pressure medications as directed. No lightheadedness or other adverse effects. She is taking Lipitor as directed. No myalgias or other adverse effects. She is taking omeprazole for GERD and is taking her magnesium supplement.   01/19/2018: Today she states that she has been doing pretty well.  No specific concerns.  Is that with this recent heat she has not necessarily gone out and walked but has definitely stayed active with grandkids etc. Today I did review with her the lab results from last visit which was 07/21/2017. That time A1c came back 7.2 so I recommended to restart the metformin at 500 mg twice daily.  Today she reports that you she did restart that and is taking it twice daily as directed.  Having no adverse effects. Also reviewed that her magnesium level came back low and that I had made a note for them to find out the dosage that she had was on but see no additional notes regarding that.  Reviewed medication list which states that she is on Mag-Ox 400 mg twice daily.  She states that that is the correct dosing. Discussed with her that the cholesterol levels were excellent at that time and to continue the same cholesterol medicines and she has continued those.  Causing no myalgias or other adverse effects. Is taking her blood pressure medications as directed.  Having no lightheadedness or other adverse effects. Is taking the omeprazole for GERD and is taking the magnesium supplement. No other concerns to address  today.  04/20/2018: She reports that things have been okay.  Once I asked further she says that her daughter has had diabetes for a long time had to have below the knee amputation recently. She also reports that her granddaughter who lives with her recently split up from her husband said that has been going on. Otherwise says that things have been fairly stable.  States that from a  medical standpoint and her own health things are stable.  She has no specific concerns that need to be addressed. Reviewed that at lab 01/19/2018 magnesium level came back low and I had recommended to increase to taking 2 of the Mag-Ox 400 in the morning for a total of 800 and to take 1 of the 400 in the p.m. Today she reports that she did increase and is taking that dose. She is taking the Lipitor as directed.  This is causing no myalgias or other adverse effects. She is taking her blood pressure medications as directed.  Having no lightheadedness or other adverse effects. Is taking the omeprazole for GERD and is taking the magnesium supplement. No other concerns to address today. Discussed that blood pressure is reading a little high today.  However reviewed that it was better at last visit.  She states that she does not check it at home and does not know of any readings since last visit here.    Past Medical History:  Diagnosis Date  . Arthritis    osteoarthritis  . Bursitis of right shoulder   . Chronic cholecystitis with calculus 05/04/2013  . 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 Diabetes type 2, controlled ([place]) 05/04/2013  . Fatty liver disease, nonalcoholic 3818   hospitalized, MRCP dx fatty liver  . GERD (gastroesophageal reflux disease)   . Hormone replacement therapy (postmenopausal)   . Hyperlipidemia   . Hypertension   . Normal cardiac stress test    pt can't remember when or where  . Obesity, unspecified 12/27/2012  . Rheumatoid arthritis(714.0) 09/28/2012  .  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 Unspecified essential hypertension 05/04/2013     Home Meds: Outpatient Medications Prior to Visit  Medication Sig Dispense Refill  . amLODipine (NORVASC) 5 MG tablet Take 1 tablet (5 mg total) by mouth daily. 90 tablet 1  . aspirin EC 81 MG tablet Take 81 mg by mouth daily.    Marland Kitchen atorvastatin (LIPITOR) 10 MG tablet TAKE 1 TABLET DAILY 90 tablet 1  . Calcium Carbonate-Vitamin D (CALCIUM 600 + D PO) Take 1 tablet by mouth 2 (two) times daily.    . cloNIDine (CATAPRES) 0.3 MG tablet TAKE 1 TABLET (0.3 MG TOTAL) BY MOUTH 2 (TWO) TIMES DAILY. 180 tablet 1  . cyanocobalamin 500 MCG tablet Take 1 tablet (500 mcg total) by mouth daily. 30 tablet 0  . diclofenac sodium (VOLTAREN) 1 % GEL Apply 3 g topically daily as needed (pain). To 3 large joints    . ENBREL SURECLICK 50 MG/ML injection INJECT 50 MG (0.98 ML) UNDER THE SKIN ONCE A WEEK 3.92 mL 2  . estradiol (ESTRACE) 0.5 MG tablet Take 0.5 mg by mouth 2 (two) times daily.    . Ferrous Sulfate (IRON) 325 (65 Fe) MG TABS Take by mouth daily.    . fish oil-omega-3 fatty acids 1000 MG capsule Take 1 g by mouth at bedtime.     . folic acid (FOLVITE) 1 MG tablet TAKE 1 TABLET BY MOUTH TWICE A DAY 90 tablet 3  . HYDROcodone-homatropine (HYCODAN) 5-1.5 MG/5ML syrup Take 5 mLs by mouth every 6 (six) hours as needed. 120 mL 0  . leflunomide (ARAVA) 10 MG tablet TAKE 1 TABLET BY MOUTH EVERY DAY 90 tablet 0  . losartan-hydrochlorothiazide (HYZAAR) 100-12.5 MG tablet TAKE 1 TABLET BY MOUTH EVERY DAY 90 tablet 1  . magnesium oxide (MAG-OX) 400 MG tablet Take  2 tablets 400mg  (total 800mg ) in the morning, and 400mg  in the evening 90 tablet 3  . metFORMIN (GLUCOPHAGE) 500 MG tablet TAKE 1 TABLET (500 MG TOTAL) BY MOUTH 2 (TWO) TIMES DAILY WITH A MEAL. 180 tablet 1  . metoprolol tartrate (LOPRESSOR) 25 MG tablet TAKE 1/2 TABLET TWICE A DAY 90 tablet 0  . Multiple Vitamins-Minerals (MULTIVITAMINS THER. W/MINERALS)  TABS Take 1 tablet by mouth daily.    Marland Kitchen omeprazole (PRILOSEC) 20 MG capsule TAKE 1 CAPSULE BY MOUTH EVERY DAY 90 capsule 3  . sulfamethoxazole-trimethoprim (BACTRIM DS,SEPTRA DS) 800-160 MG tablet Take 1 tablet by mouth 2 (two) times daily. 20 tablet 0   No facility-administered medications prior to visit.     Allergies:  Allergies  Allergen Reactions  . Monopril [Fosinopril] Cough  . Norvasc [Amlodipine Besylate] Other (See Comments)    10 mg dose causes LE edema.  Tolerates 5mg  dose.    Social History   Socioeconomic History  . Marital status: Divorced    Spouse name: Not on file  . Number of children: Not on file  . Years of education: Not on file  . Highest education level: Not on file  Occupational History  . Not on file  Social Needs  . Financial resource strain: Not on file  . Food insecurity:    Worry: Not on file    Inability: Not on file  . Transportation needs:    Medical: Not on file    Non-medical: Not on file  Tobacco Use  . Smoking status: Former Smoker    Packs/day: 0.25    Years: 2.00    Pack years: 0.50    Types: Cigarettes    Last attempt to quit: 11/21/2013    Years since quitting: 4.4  . Smokeless tobacco: Never Used  . Tobacco comment: smoked regulary for approx 10 yrs- stopped in her 5's- but still smokes occ cig/Aug 2015  Substance and Sexual Activity  . Alcohol use: Yes    Comment: occasional  . Drug use: No  . Sexual activity: Never  Lifestyle  . Physical activity:    Days per week: Not on file    Minutes per session: Not on file  . Stress: Not on file  Relationships  . Social connections:    Talks on phone: Not on file    Gets together: Not on file    Attends religious service: Not on file    Active member of club or organization: Not on file    Attends meetings of clubs or organizations: Not on file    Relationship status: Not on file  . Intimate partner violence:    Fear of current or ex partner: Not on file    Emotionally  abused: Not on file    Physically abused: Not on file    Forced sexual activity: Not on file  Other Topics Concern  . Not on file  Social History Narrative  . Not on file    Family History  Problem Relation Age of Onset  . Cancer Mother        breast  . Heart attack Mother   . Stroke Father   . Diabetes Sister   . Diabetes Daughter      Review of Systems:  See HPI for pertinent ROS. All other ROS negative.     Physical Exam: Blood pressure (!) 148/80, pulse 60, temperature 98 F (36.7 C), temperature source Oral, resp. rate 14, height 4\' 10"  (1.473 m), weight 82.2  kg, SpO2 94 %., Body mass index is 37.87 kg/m. General: WF. Appears in no acute distress.  Neck: Supple. No thyromegaly. No lymphadenopathy.  No carotid bruits. Lungs: Clear bilaterally to auscultation without wheezes, rales, or rhonchi. Breathing is unlabored. Heart: RRR with S1 S2. No murmurs, rubs, or gallops. Abdomen: Soft, non-tender, non-distended with normoactive bowel sounds. No hepatomegaly. No rebound/guarding. No obvious abdominal masses. Musculoskeletal:  Strength and tone normal for age. Extremities/Skin: Warm and dry.No edema.  Diabetic foot exam: Inspection is normal.  There are no open wounds or calluses or problem areas. Neuro: Alert and oriented X 3. Moves all extremities spontaneously. Gait is normal. CNII-XII grossly in tact. Psych:  Responds to questions appropriately with a normal affect.     ASSESSMENT AND PLAN:  72 y.o. year old female with     Hypomagnesemia 07/21/2017: In past--checked Mg b/c she was on PPI---Mg was low--she has been on supplement---recheck now. 01/19/2018: Last lab magnesium level was still low.  Today she is pretty sure she is on Mag-Ox 400 mg twice daily--she is quite certain that is the current dose.  Recheck magnesium level today and then adjust dose accordingly. 04/20/2018: Lab 01/19/2018 came back low at 1.3.  At that time recommended to increase to taking 2 of  the Mag-Ox 400 in the morning for total of 800 in the morning and to continue taking 1 400 in the evening.  Reports that she is taking this dose as directed.  Recheck lab to monitor. - Magnesium    Diabetes type 2, controlled  She is on aspirin 81 mg a day. She is on ARB. She is intolerant to ACE inhibitors secondary to cough. She is on statin. LDL has been at goal of less than 70.  She saw Ophthalmologist for diabetic eye exam in February 2017---"he says everything looks good" Foot exam is normal. She reports that she has needed no neuropathy symptoms.  She had required medications to control blood sugar in the past.  Until 12/2014, she had been having office as its with me every 3 months because she had been requiring diabetic medications. However at that time she was able to come off of diabetic medications.                      Then she had gotten to the point that she was no longer requiring medications.     This was being controlled with diet and exercise.     However at the time of her lab 07/21/2017 A1c came back at 7.2.     At that time it started back on metformin 500 mg twice daily.      04/20/2018: She is taking metformin as directed.  Last A1c 01/19/2018 was good at 6.9.  Recheck A1c today to monitor. ----------- she had microalbumin 01/19/2018. ---------- she is on statin.  She is on ARB.   Essential hypertension,  04/20/2018: Blood Pressure slightly high today.  However was better at last visit.  Therefore will not change meds today.  Will monitor and if elevated again at next visit then will adjust meds.  On ARB. Intolerant to ACE inhibitor.   Continue current medications. Check lab to monitor.   Hyperlipidemia On Lipitor.At Lab Result 05/22/14--decreased Lipitor dose to 20 mg . F/U  FLP 07/10/14---FLP Excellent.  Has remained well controlled at that dose since 01/19/2018: -----Recheck labs again today to monitor. She is fasting. 04/20/2018: She is on Lipitor.  She had  FLP/LFT  01/19/2018.  LDL 88.  Continue current dose Lipitor.  Fatty liver disease, nonalcoholic 7/37/1062:  LFTs have been stable. Recheck LFTs now 04/20/2018:  LFTs were normal on lab 01/19/2018.  Anemia, unspecified anemia type - CBC was done 05/22/14 and was stable.  Hormone replacement therapy (postmenopausal) 04/20/2018: Per Gyn  Rheumatoid arthritis(714.0) 04/20/2018: Per Dr. Estanislado Pandy  ADDENDUM--ADDED 05/21/2015: I received office note from Dr. Estanislado Pandy dated 05/16/15. The following is copied from that [place] note: "Pt reports that she has been experiencing increased pain and discomfort in multiple joints. She has pain in her bilateral shoulders, bilateral wrist joints and hands. Tenderness on palpation over her MCPs and PIPs. Also has thickening of bilateral CMC joints due to underlying osteoarthritis. She has swelling and warmth over bilateral ankle joints. She appears to be having a flare of her rheumatoid arthritis. She is on high risk prescription, taking methotrexate 6 tablets per week, which is not working as well for her. She could not increase her dose of methotrexate any further. She does have osteoarthritis, which causes some discomfort also. Different treatment options and their side effects were discussed at length. Discussed option of using combination therapy and adding plaquenil to her regimen. She was prescribed Plaquenil 200 mg 1 by mouth daily 90 day supply with 1 refill. Was advised to get eye exams to monitor for ocular toxicity. Orders given for labs every 3 months to monitor drug toxicity. They also discussed that most likely she would have to get on more aggressive therapy than just methotrexate and Plaquenil in the future. To cover that she checked TB cold, HIV, SPEP, immunoglobulins. Dr. Estanislado Pandy noted she is anemic and told her to follow-up with her PCP regarding that.  ADDENDUM ADDED 12/08/2015: Received OV note from Dr. Estanislado Pandy dated 12/02/2105. "She states she has been  having increased pain and swelling in her hands. Due to her elevated creatinine, we had to decrease her methotrexate from 6 to 5 and now to 4 tablets per week and she states she has been having increased pain and swelling to the point she is having difficulty making a fist." "---She is taking Plaquenil 200mg  a day along with methotrexate, which is not controlling her disease. We had detailed discussion regarding her current situation. We will d/c methotrexate and start [place]. Continue the Plaquenil for now---"  Gastroesophageal reflux disease, esophagitis presence not specified 04/20/2018: : This is controlled with current medication.  Multiple pulmonary nodules She has been following up with Dr. Melvyn Novas at pulmonary. At [place] 05/22/14 she said that she is scheduled for "another scan in November". At [facility] 07/10/14--she had f/u scan November. Says there was no change and she is to f/u 6 months.  --Says at last check, no change. Dr. Melvyn Novas told her can wait 2 years to repeat scan. 01/19/2017:Reminded her to f/u with Dr. Minna Merritts  Preventive Care   Colonoscopy: 04/30/2011 colon polyp. She says she was told to repeat 10 years.    Pap smear, mammogram, and BMD : Sees GYN annually and may manage this.  At [facility] 11/21/2013 she reported that they do the mammograms there in the gynecology office itself. [name] this is at [facility]. I had her sign a release that day so that we could get those records to enter into our abstraction for quality metrics.  07/21/2017: Reports that she does have regular routine mammograms performed at her gynecologist Dr. Jadene Pierini.  States that she just recently had visit there.  Addendum Added 02/17/2017----Received Bone Density Report from  [facility]--Test performed 01/26/2017 T scores were +0.20, -1.80, -1.50, -1.20, -1.40, - 0.6, -0.5, considered osteopenia. She is to continue calcium and vitamin D and weightbearing exercise.   Immunizations:  Tdap: 05/2011  Pneumovax: 05/2011   Prevnar 13: 07/04/2013  Influenza vaccine she did receive this in the fall of 2014. Says she received influenza vaccine at work for free ---to cover 2015-2016 Flu Season. Received 05/2015/ Received 2017 Zostavax: 09/2011    Signed, [address] [place], [place], [facility] 04/20/2018 11:59 AM

## 2018-04-21 ENCOUNTER — Other Ambulatory Visit: Payer: Self-pay

## 2018-04-21 DIAGNOSIS — Z79899 Other long term (current) drug therapy: Secondary | ICD-10-CM | POA: Diagnosis not present

## 2018-04-21 LAB — MAGNESIUM: MAGNESIUM: 1.2 mg/dL — AB (ref 1.5–2.5)

## 2018-04-21 LAB — HEMOGLOBIN A1C
HEMOGLOBIN A1C: 7.4 %{Hb} — AB (ref <5.7)
MEAN PLASMA GLUCOSE: 166 (calc)
eAG (mmol/L): 9.2 (calc)

## 2018-04-21 MED ORDER — METFORMIN HCL 1000 MG PO TABS: 1000.0000 mg | ORAL_TABLET | Freq: Two times a day (BID) | ORAL | 5 refills | Status: DC

## 2018-04-21 MED ORDER — MAGNESIUM 400 MG PO TABS: 800.0000 mg | ORAL_TABLET | Freq: Two times a day (BID) | ORAL | 3 refills | Status: DC

## 2018-04-23 LAB — COMPLETE METABOLIC PANEL WITH GFR
AG RATIO: 1.3 (calc) (ref 1.0–2.5)
ALBUMIN MSPROF: 3.7 g/dL (ref 3.6–5.1)
ALKALINE PHOSPHATASE (APISO): 98 U/L (ref 33–130)
ALT: 21 U/L (ref 6–29)
AST: 29 U/L (ref 10–35)
BUN/Creatinine Ratio: 19 (calc) (ref 6–22)
BUN: 19 mg/dL (ref 7–25)
CO2: 25 mmol/L (ref 20–32)
Calcium: 9.8 mg/dL (ref 8.6–10.4)
Chloride: 99 mmol/L (ref 98–110)
Creat: 1.02 mg/dL — ABNORMAL HIGH (ref 0.60–0.93)
GFR, EST NON AFRICAN AMERICAN: 55 mL/min/{1.73_m2} — AB (ref >=60)
GFR, Est African American: 63 mL/min/{1.73_m2} (ref >=60)
GLOBULIN: 2.9 g/dL (ref 1.9–3.7)
Glucose, Bld: 206 mg/dL — ABNORMAL HIGH (ref 65–99)
POTASSIUM: 3.5 mmol/L (ref 3.5–5.3)
SODIUM: 138 mmol/L (ref 135–146)
Total Bilirubin: 0.4 mg/dL (ref 0.2–1.2)
Total Protein: 6.6 g/dL (ref 6.1–8.1)

## 2018-04-23 LAB — CBC WITH DIFFERENTIAL/PLATELET
Basophils Absolute: 28 cells/uL (ref 0–200)
Basophils Relative: 0.6 %
EOS ABS: 381 {cells}/uL (ref 15–500)
EOS PCT: 8.1 %
HEMATOCRIT: 40.2 % (ref 35.0–45.0)
Hemoglobin: 13.4 g/dL (ref 11.7–15.5)
Lymphs Abs: 1734 cells/uL (ref 850–3900)
MCH: 31.6 pg (ref 27.0–33.0)
MCHC: 33.3 g/dL (ref 32.0–36.0)
MCV: 94.8 fL (ref 80.0–100.0)
MONOS PCT: 15.8 %
MPV: 12.2 fL (ref 7.5–12.5)
NEUTROS PCT: 38.6 %
Neutro Abs: 1814 cells/uL (ref 1500–7800)
Platelets: 145 10*3/uL (ref 140–400)
RBC: 4.24 10*6/uL (ref 3.80–5.10)
RDW: 12.4 % (ref 11.0–15.0)
TOTAL LYMPHOCYTE: 36.9 %
WBC: 4.7 10*3/uL (ref 3.8–10.8)
WBCMIX: 743 {cells}/uL (ref 200–950)

## 2018-04-23 LAB — QUANTIFERON-TB GOLD PLUS
NIL: 0.06 [IU]/mL
QuantiFERON-TB Gold Plus: NEGATIVE
TB1-NIL: 0.02 [IU]/mL
TB2-NIL: 0.01 IU/mL

## 2018-04-26 ENCOUNTER — Encounter: Payer: Self-pay | Admitting: Rheumatology

## 2018-04-26 ENCOUNTER — Ambulatory Visit: Payer: PPO | Admitting: Rheumatology

## 2018-04-26 ENCOUNTER — Telehealth: Payer: Self-pay | Admitting: Rheumatology

## 2018-04-26 VITALS — BP 156/80 | HR 78 | Resp 16 | Ht <= 58 in | Wt 184.0 lb

## 2018-04-26 DIAGNOSIS — R202 Paresthesia of skin: Secondary | ICD-10-CM | POA: Diagnosis not present

## 2018-04-26 DIAGNOSIS — R918 Other nonspecific abnormal finding of lung field: Secondary | ICD-10-CM | POA: Diagnosis not present

## 2018-04-26 DIAGNOSIS — Z8639 Personal history of other endocrine, nutritional and metabolic disease: Secondary | ICD-10-CM | POA: Diagnosis not present

## 2018-04-26 DIAGNOSIS — Z8719 Personal history of other diseases of the digestive system: Secondary | ICD-10-CM | POA: Diagnosis not present

## 2018-04-26 DIAGNOSIS — Z79899 Other long term (current) drug therapy: Secondary | ICD-10-CM

## 2018-04-26 DIAGNOSIS — Z8679 Personal history of other diseases of the circulatory system: Secondary | ICD-10-CM

## 2018-04-26 DIAGNOSIS — Z96653 Presence of artificial knee joint, bilateral: Secondary | ICD-10-CM

## 2018-04-26 DIAGNOSIS — M19042 Primary osteoarthritis, left hand: Secondary | ICD-10-CM

## 2018-04-26 DIAGNOSIS — M19041 Primary osteoarthritis, right hand: Secondary | ICD-10-CM

## 2018-04-26 DIAGNOSIS — M0579 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 without organ or systems involvement: Secondary | ICD-10-CM | POA: Diagnosis not present

## 2018-04-26 DIAGNOSIS — Z9049 Acquired absence of other specified parts of digestive tract: Secondary | ICD-10-CM

## 2018-04-26 NOTE — Patient Instructions (Signed)
Standing Labs We placed an order today for your standing lab work.    Please come back and get your standing labs in November and every 3 months   We have open lab Monday through Friday from 8:30-11:30 AM and 1:30-4:00 PM  at the office of Dr. Shaili Deveshwar.   You may experience shorter wait times on Monday and Friday afternoons. The office is located at [address], [address], [place], [place] [address] No appointment is necessary.   Labs are drawn by [facility].  You may receive a bill from [facility] for your lab work. If you have any questions regarding directions or hours of operation,  please call [phone].     

## 2018-04-26 NOTE — Telephone Encounter (Signed)
New prescription for Enbrel faxed to [employer]. Called patient to advise.  Fax# [phone]  2:24 PM Beatriz Chancellor, CPhT

## 2018-04-26 NOTE — Telephone Encounter (Signed)
Patient left a voicemail requesting prescription refill of Enbrel to be sent to [facility].

## 2018-05-20 ENCOUNTER — Other Ambulatory Visit: Payer: Self-pay | Admitting: Physician Assistant

## 2018-05-23 ENCOUNTER — Other Ambulatory Visit: Payer: Self-pay | Admitting: Rheumatology

## 2018-05-23 NOTE — Telephone Encounter (Signed)
Last Visit: 04/26/18 Next visit: 09/27/18 Labs: 04/21/18 CBC WNL. Glucose is elevated-206. Creatinine is stable.  Okay to refill per Dr. Estanislado Pandy

## 2018-05-31 ENCOUNTER — Other Ambulatory Visit: Payer: Self-pay | Admitting: Physician Assistant

## 2018-06-07 ENCOUNTER — Telehealth: Payer: Self-pay | Admitting: Pharmacy Technician

## 2018-06-07 NOTE — Telephone Encounter (Signed)
Enbrel patient assistance:  Spoke to patient about [employer] open enrollment, patient will come by office to complete application.  9:11 AM [name], CPhT

## 2018-07-11 ENCOUNTER — Inpatient Hospital Stay (HOSPITAL_COMMUNITY)
Admission: EM | Admit: 2018-07-11 | Discharge: 2018-07-13 | DRG: 866 | Disposition: A | Discharge status: Home / Self Care | Payer: PPO | Attending: Family Medicine | Admitting: Family Medicine

## 2018-07-11 ENCOUNTER — Encounter: Payer: Self-pay | Admitting: Family Medicine

## 2018-07-11 ENCOUNTER — Encounter (HOSPITAL_COMMUNITY): Payer: Self-pay

## 2018-07-11 ENCOUNTER — Other Ambulatory Visit: Payer: Self-pay

## 2018-07-11 ENCOUNTER — Emergency Department (HOSPITAL_COMMUNITY): Payer: PPO

## 2018-07-11 ENCOUNTER — Other Ambulatory Visit: Payer: Self-pay | Admitting: Physician Assistant

## 2018-07-11 ENCOUNTER — Ambulatory Visit (INDEPENDENT_AMBULATORY_CARE_PROVIDER_SITE_OTHER): Payer: PPO | Admitting: Family Medicine

## 2018-07-11 VITALS — BP 140/98 | HR 137 | Temp 97.6°F | Resp 20 | Ht <= 58 in | Wt 156.2 lb

## 2018-07-11 DIAGNOSIS — E119 Type 2 diabetes mellitus without complications: Secondary | ICD-10-CM

## 2018-07-11 DIAGNOSIS — I160 Hypertensive urgency: Secondary | ICD-10-CM | POA: Diagnosis not present

## 2018-07-11 DIAGNOSIS — M0579 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 without organ or systems involvement: Secondary | ICD-10-CM | POA: Diagnosis not present

## 2018-07-11 DIAGNOSIS — K5730 Diverticulosis of large intestine without perforation or abscess without bleeding: Secondary | ICD-10-CM | POA: Diagnosis not present

## 2018-07-11 DIAGNOSIS — I10 Essential (primary) hypertension: Secondary | ICD-10-CM | POA: Diagnosis present

## 2018-07-11 DIAGNOSIS — R809 Proteinuria, unspecified: Secondary | ICD-10-CM | POA: Diagnosis present

## 2018-07-11 DIAGNOSIS — Z96651 Presence of right artificial knee joint: Secondary | ICD-10-CM | POA: Diagnosis not present

## 2018-07-11 DIAGNOSIS — N200 Calculus of kidney: Secondary | ICD-10-CM | POA: Diagnosis not present

## 2018-07-11 DIAGNOSIS — J9601 Acute respiratory failure with hypoxia: Secondary | ICD-10-CM

## 2018-07-11 DIAGNOSIS — Z79899 Other long term (current) drug therapy: Secondary | ICD-10-CM

## 2018-07-11 DIAGNOSIS — R112 Nausea with vomiting, unspecified: Secondary | ICD-10-CM | POA: Diagnosis not present

## 2018-07-11 DIAGNOSIS — Z888 Allergy status to other drugs, medicaments and biological substances status: Secondary | ICD-10-CM | POA: Diagnosis not present

## 2018-07-11 DIAGNOSIS — E785 Hyperlipidemia, unspecified: Secondary | ICD-10-CM | POA: Diagnosis not present

## 2018-07-11 DIAGNOSIS — E1165 Type 2 diabetes mellitus with hyperglycemia: Secondary | ICD-10-CM | POA: Diagnosis not present

## 2018-07-11 DIAGNOSIS — Z7984 Long term (current) use of oral hypoglycemic drugs: Secondary | ICD-10-CM

## 2018-07-11 DIAGNOSIS — E860 Dehydration: Secondary | ICD-10-CM

## 2018-07-11 DIAGNOSIS — D72829 Elevated white blood cell count, unspecified: Secondary | ICD-10-CM | POA: Diagnosis present

## 2018-07-11 DIAGNOSIS — E876 Hypokalemia: Secondary | ICD-10-CM | POA: Diagnosis not present

## 2018-07-11 DIAGNOSIS — R110 Nausea: Secondary | ICD-10-CM | POA: Diagnosis not present

## 2018-07-11 DIAGNOSIS — A419 Sepsis, unspecified organism: Secondary | ICD-10-CM | POA: Diagnosis present

## 2018-07-11 DIAGNOSIS — Z6834 Body mass index (BMI) 34.0-34.9, adult: Secondary | ICD-10-CM | POA: Diagnosis not present

## 2018-07-11 DIAGNOSIS — Z79818 Long term (current) use of other agents affecting estrogen receptors and estrogen levels: Secondary | ICD-10-CM | POA: Diagnosis not present

## 2018-07-11 DIAGNOSIS — R6510 Systemic inflammatory response syndrome (SIRS) of non-infectious origin without acute organ dysfunction: Secondary | ICD-10-CM | POA: Diagnosis not present

## 2018-07-11 DIAGNOSIS — B349 Viral infection, unspecified: Secondary | ICD-10-CM | POA: Diagnosis not present

## 2018-07-11 DIAGNOSIS — Z7982 Long term (current) use of aspirin: Secondary | ICD-10-CM | POA: Diagnosis not present

## 2018-07-11 DIAGNOSIS — R05 Cough: Secondary | ICD-10-CM | POA: Diagnosis not present

## 2018-07-11 DIAGNOSIS — R509 Fever, unspecified: Secondary | ICD-10-CM | POA: Diagnosis not present

## 2018-07-11 DIAGNOSIS — R000 Tachycardia, unspecified: Secondary | ICD-10-CM | POA: Diagnosis not present

## 2018-07-11 DIAGNOSIS — E669 Obesity, unspecified: Secondary | ICD-10-CM | POA: Diagnosis present

## 2018-07-11 DIAGNOSIS — R0602 Shortness of breath: Secondary | ICD-10-CM | POA: Diagnosis not present

## 2018-07-11 DIAGNOSIS — Z23 Encounter for immunization: Secondary | ICD-10-CM | POA: Diagnosis not present

## 2018-07-11 DIAGNOSIS — R911 Solitary pulmonary nodule: Secondary | ICD-10-CM | POA: Diagnosis not present

## 2018-07-11 DIAGNOSIS — E041 Nontoxic single thyroid nodule: Secondary | ICD-10-CM | POA: Diagnosis not present

## 2018-07-11 DIAGNOSIS — R059 Cough, unspecified: Secondary | ICD-10-CM

## 2018-07-11 DIAGNOSIS — M069 Rheumatoid arthritis, unspecified: Secondary | ICD-10-CM | POA: Diagnosis not present

## 2018-07-11 DIAGNOSIS — J3489 Other specified disorders of nose and nasal sinuses: Secondary | ICD-10-CM | POA: Diagnosis not present

## 2018-07-11 DIAGNOSIS — R1111 Vomiting without nausea: Secondary | ICD-10-CM | POA: Diagnosis not present

## 2018-07-11 DIAGNOSIS — R0902 Hypoxemia: Secondary | ICD-10-CM | POA: Diagnosis not present

## 2018-07-11 HISTORY — DX: Systemic inflammatory response syndrome (sirs) of non-infectious origin without acute organ dysfunction: R65.10

## 2018-07-11 HISTORY — DX: Nausea with vomiting, unspecified: R11.2

## 2018-07-11 HISTORY — DX: Hypokalemia: E87.6

## 2018-07-11 HISTORY — DX: Hypertensive urgency: I16.0

## 2018-07-11 LAB — COMPREHENSIVE METABOLIC PANEL
ALBUMIN: 4 g/dL (ref 3.5–5.0)
ALK PHOS: 125 U/L (ref 38–126)
ALT: 25 U/L (ref 0–44)
AST: 35 U/L (ref 15–41)
Anion gap: 15 (ref 5–15)
BUN: 21 mg/dL (ref 8–23)
CALCIUM: 9.4 mg/dL (ref 8.9–10.3)
CO2: 29 mmol/L (ref 22–32)
Chloride: 98 mmol/L (ref 98–111)
Creatinine, Ser: 1.05 mg/dL — ABNORMAL HIGH (ref 0.44–1.00)
GFR calc Af Amer: 59 mL/min — ABNORMAL LOW (ref >60)
GFR calc non Af Amer: 51 mL/min — ABNORMAL LOW (ref >60)
GLUCOSE: 234 mg/dL — AB (ref 70–99)
Potassium: 3 mmol/L — ABNORMAL LOW (ref 3.5–5.1)
Sodium: 142 mmol/L (ref 135–145)
TOTAL PROTEIN: 8.6 g/dL — AB (ref 6.5–8.1)
Total Bilirubin: 1.3 mg/dL — ABNORMAL HIGH (ref 0.3–1.2)

## 2018-07-11 LAB — CBC WITH DIFFERENTIAL/PLATELET
Abs Immature Granulocytes: 0.18 10*3/uL — ABNORMAL HIGH (ref 0.00–0.07)
BASOS PCT: 0 %
Basophils Absolute: 0 10*3/uL (ref 0.0–0.1)
Eosinophils Absolute: 0 10*3/uL (ref 0.0–0.5)
Eosinophils Relative: 0 %
HEMATOCRIT: 50.2 % — AB (ref 36.0–46.0)
Hemoglobin: 16.2 g/dL — ABNORMAL HIGH (ref 12.0–15.0)
IMMATURE GRANULOCYTES: 1 %
LYMPHS ABS: 1.3 10*3/uL (ref 0.7–4.0)
Lymphocytes Relative: 8 %
MCH: 31.5 pg (ref 26.0–34.0)
MCHC: 32.3 g/dL (ref 30.0–36.0)
MCV: 97.5 fL (ref 80.0–100.0)
MONO ABS: 1.9 10*3/uL — AB (ref 0.1–1.0)
MONOS PCT: 11 %
NEUTROS PCT: 80 %
Neutro Abs: 14.2 10*3/uL — ABNORMAL HIGH (ref 1.7–7.7)
PLATELETS: 228 10*3/uL (ref 150–400)
RBC: 5.15 MIL/uL — ABNORMAL HIGH (ref 3.87–5.11)
RDW: 13.9 % (ref 11.5–15.5)
WBC: 17.6 10*3/uL — ABNORMAL HIGH (ref 4.0–10.5)
nRBC: 0 % (ref 0.0–0.2)

## 2018-07-11 LAB — URINALYSIS, ROUTINE W REFLEX MICROSCOPIC
Bilirubin Urine: NEGATIVE
Glucose, UA: 150 mg/dL — AB
KETONES UR: 20 mg/dL — AB
Nitrite: NEGATIVE
Specific Gravity, Urine: 1.014 (ref 1.005–1.030)
pH: 8 (ref 5.0–8.0)

## 2018-07-11 LAB — INFLUENZA PANEL BY PCR (TYPE A & B)
Influenza A By PCR: NEGATIVE
Influenza B By PCR: NEGATIVE

## 2018-07-11 LAB — I-STAT CG4 LACTIC ACID, ED: Lactic Acid, Venous: 2.25 mmol/L (ref 0.5–1.9)

## 2018-07-11 LAB — CBG MONITORING, ED: GLUCOSE-CAPILLARY: 189 mg/dL — AB (ref 70–99)

## 2018-07-11 LAB — LACTIC ACID, PLASMA: Lactic Acid, Venous: 1.4 mmol/L (ref 0.5–1.9)

## 2018-07-11 LAB — GLUCOSE 16585: GLUCOSE 2500: 271 mg/dL — AB (ref 65–99)

## 2018-07-11 MED ORDER — VANCOMYCIN HCL 10 G IV SOLR
1500.0000 mg | Freq: Once | INTRAVENOUS | Status: AC
  Administered 2018-07-11: 1500 mg via INTRAVENOUS
  Filled 2018-07-11: qty 1500

## 2018-07-11 MED ORDER — CLONIDINE HCL 0.3 MG PO TABS
0.3000 mg | ORAL_TABLET | Freq: Two times a day (BID) | ORAL | Status: DC
  Administered 2018-07-12 – 2018-07-13 (×4): 0.3 mg via ORAL
  Filled 2018-07-11: qty 3
  Filled 2018-07-11: qty 1
  Filled 2018-07-11: qty 3
  Filled 2018-07-11: qty 1

## 2018-07-11 MED ORDER — PANTOPRAZOLE SODIUM 40 MG PO TBEC
40.0000 mg | DELAYED_RELEASE_TABLET | Freq: Every day | ORAL | Status: DC
  Administered 2018-07-12 – 2018-07-13 (×3): 40 mg via ORAL
  Filled 2018-07-11 (×3): qty 1

## 2018-07-11 MED ORDER — HYDRALAZINE HCL 20 MG/ML IJ SOLN
10.0000 mg | Freq: Once | INTRAMUSCULAR | Status: AC
  Administered 2018-07-11: 10 mg via INTRAVENOUS
  Filled 2018-07-11: qty 1

## 2018-07-11 MED ORDER — HYDROCODONE-ACETAMINOPHEN 5-325 MG PO TABS
1.0000 | ORAL_TABLET | ORAL | Status: DC | PRN
  Administered 2018-07-12 – 2018-07-13 (×2): 1 via ORAL
  Filled 2018-07-11: qty 1
  Filled 2018-07-11: qty 2
  Filled 2018-07-11: qty 1

## 2018-07-11 MED ORDER — VITAMIN B-12 1000 MCG PO TABS
500.0000 ug | ORAL_TABLET | Freq: Every day | ORAL | Status: DC
  Administered 2018-07-12 – 2018-07-13 (×2): 500 ug via ORAL
  Filled 2018-07-11 (×4): qty 1

## 2018-07-11 MED ORDER — POTASSIUM CHLORIDE CRYS ER 20 MEQ PO TBCR
20.0000 meq | EXTENDED_RELEASE_TABLET | Freq: Once | ORAL | Status: AC
  Administered 2018-07-11: 20 meq via ORAL
  Filled 2018-07-11: qty 1

## 2018-07-11 MED ORDER — SODIUM CHLORIDE (PF) 0.9 % IJ SOLN
INTRAMUSCULAR | Status: AC
  Filled 2018-07-11: qty 50

## 2018-07-11 MED ORDER — ONDANSETRON HCL 4 MG/2ML IJ SOLN: 4.0000 mg | Freq: Four times a day (QID) | INTRAMUSCULAR | Status: DC | PRN

## 2018-07-11 MED ORDER — SODIUM CHLORIDE 0.9% FLUSH
3.0000 mL | Freq: Two times a day (BID) | INTRAVENOUS | Status: DC
  Administered 2018-07-11 – 2018-07-13 (×3): 3 mL via INTRAVENOUS

## 2018-07-11 MED ORDER — LACTATED RINGERS IV BOLUS (SEPSIS)
1000.0000 mL | Freq: Once | INTRAVENOUS | Status: AC
  Administered 2018-07-11: 1000 mL via INTRAVENOUS

## 2018-07-11 MED ORDER — SODIUM CHLORIDE 0.9 % IV SOLN
1.0000 g | Freq: Two times a day (BID) | INTRAVENOUS | Status: DC
  Administered 2018-07-12 – 2018-07-13 (×4): 1 g via INTRAVENOUS
  Filled 2018-07-11 (×5): qty 1

## 2018-07-11 MED ORDER — LEFLUNOMIDE 20 MG PO TABS
10.0000 mg | ORAL_TABLET | Freq: Every day | ORAL | Status: DC
  Administered 2018-07-12 – 2018-07-13 (×2): 10 mg via ORAL
  Filled 2018-07-11 (×3): qty 0.5

## 2018-07-11 MED ORDER — FOLIC ACID 1 MG PO TABS
1.0000 mg | ORAL_TABLET | Freq: Two times a day (BID) | ORAL | Status: DC
  Administered 2018-07-12 – 2018-07-13 (×4): 1 mg via ORAL
  Filled 2018-07-11 (×4): qty 1

## 2018-07-11 MED ORDER — VANCOMYCIN HCL IN DEXTROSE 1-5 GM/200ML-% IV SOLN
1000.0000 mg | Freq: Once | INTRAVENOUS | Status: DC
  Filled 2018-07-11: qty 200

## 2018-07-11 MED ORDER — AMLODIPINE BESYLATE 5 MG PO TABS
5.0000 mg | ORAL_TABLET | Freq: Every day | ORAL | Status: DC
  Administered 2018-07-12 – 2018-07-13 (×3): 5 mg via ORAL
  Filled 2018-07-11 (×3): qty 1

## 2018-07-11 MED ORDER — ONDANSETRON HCL 4 MG/2ML IJ SOLN
4.0000 mg | Freq: Once | INTRAMUSCULAR | Status: AC
  Administered 2018-07-11: 4 mg via INTRAVENOUS
  Filled 2018-07-11: qty 2

## 2018-07-11 MED ORDER — ENOXAPARIN SODIUM 40 MG/0.4ML ~~LOC~~ SOLN
40.0000 mg | Freq: Every day | SUBCUTANEOUS | Status: DC
  Administered 2018-07-11 – 2018-07-12 (×2): 40 mg via SUBCUTANEOUS
  Filled 2018-07-11 (×2): qty 0.4

## 2018-07-11 MED ORDER — METRONIDAZOLE IN NACL 5-0.79 MG/ML-% IV SOLN
500.0000 mg | Freq: Three times a day (TID) | INTRAVENOUS | Status: DC
  Administered 2018-07-11 – 2018-07-12 (×4): 500 mg via INTRAVENOUS
  Filled 2018-07-11 (×4): qty 100

## 2018-07-11 MED ORDER — INSULIN ASPART 100 UNIT/ML ~~LOC~~ SOLN: 0.0000 [IU] | Freq: Every day | SUBCUTANEOUS | Status: DC

## 2018-07-11 MED ORDER — METOPROLOL TARTRATE 25 MG PO TABS
12.5000 mg | ORAL_TABLET | Freq: Two times a day (BID) | ORAL | Status: DC
  Administered 2018-07-12 – 2018-07-13 (×4): 12.5 mg via ORAL
  Filled 2018-07-11: qty 1
  Filled 2018-07-11: qty 0.5
  Filled 2018-07-11 (×2): qty 1

## 2018-07-11 MED ORDER — ASPIRIN EC 81 MG PO TBEC
81.0000 mg | DELAYED_RELEASE_TABLET | Freq: Every day | ORAL | Status: DC
  Administered 2018-07-12 – 2018-07-13 (×3): 81 mg via ORAL
  Filled 2018-07-11 (×3): qty 1

## 2018-07-11 MED ORDER — PROMETHAZINE HCL 25 MG/ML IJ SOLN
12.5000 mg | Freq: Once | INTRAMUSCULAR | Status: AC
  Administered 2018-07-11: 12.5 mg via INTRAVENOUS
  Filled 2018-07-11: qty 1

## 2018-07-11 MED ORDER — ADULT MULTIVITAMIN W/MINERALS CH
1.0000 | ORAL_TABLET | Freq: Every day | ORAL | Status: DC
  Administered 2018-07-12 – 2018-07-13 (×2): 1 via ORAL
  Filled 2018-07-11 (×2): qty 1

## 2018-07-11 MED ORDER — POTASSIUM CHLORIDE IN NACL 40-0.9 MEQ/L-% IV SOLN
INTRAVENOUS | Status: AC
  Administered 2018-07-12: 100 mL/h via INTRAVENOUS
  Filled 2018-07-11: qty 1000

## 2018-07-11 MED ORDER — ACETAMINOPHEN 650 MG RE SUPP: 650.0000 mg | Freq: Four times a day (QID) | RECTAL | Status: DC | PRN

## 2018-07-11 MED ORDER — ATORVASTATIN CALCIUM 10 MG PO TABS
10.0000 mg | ORAL_TABLET | Freq: Every day | ORAL | Status: DC
  Administered 2018-07-12 – 2018-07-13 (×2): 10 mg via ORAL
  Filled 2018-07-11 (×2): qty 1

## 2018-07-11 MED ORDER — MAGNESIUM OXIDE 400 (241.3 MG) MG PO TABS
800.0000 mg | ORAL_TABLET | Freq: Two times a day (BID) | ORAL | Status: DC
  Administered 2018-07-11 – 2018-07-13 (×4): 800 mg via ORAL
  Filled 2018-07-11 (×4): qty 2

## 2018-07-11 MED ORDER — DICLOFENAC SODIUM 1 % TD GEL: 4.0000 g | Freq: Every day | TRANSDERMAL | Status: DC | PRN

## 2018-07-11 MED ORDER — LOSARTAN POTASSIUM 25 MG PO TABS
12.5000 mg | ORAL_TABLET | Freq: Every day | ORAL | Status: DC
  Administered 2018-07-11 – 2018-07-13 (×3): 12.5 mg via ORAL
  Filled 2018-07-11: qty 0.5
  Filled 2018-07-11: qty 1
  Filled 2018-07-11 (×2): qty 0.5

## 2018-07-11 MED ORDER — INSULIN ASPART 100 UNIT/ML ~~LOC~~ SOLN
0.0000 [IU] | Freq: Three times a day (TID) | SUBCUTANEOUS | Status: DC
  Administered 2018-07-12: 2 [IU] via SUBCUTANEOUS
  Administered 2018-07-12 (×2): 1 [IU] via SUBCUTANEOUS
  Administered 2018-07-13 (×2): 2 [IU] via SUBCUTANEOUS
  Filled 2018-07-11: qty 1

## 2018-07-11 MED ORDER — METOPROLOL TARTRATE 25 MG PO TABS: 12.5000 mg | ORAL_TABLET | Freq: Two times a day (BID) | ORAL | Status: DC

## 2018-07-11 MED ORDER — VANCOMYCIN HCL IN DEXTROSE 750-5 MG/150ML-% IV SOLN: 750.0000 mg | INTRAVENOUS | Status: DC

## 2018-07-11 MED ORDER — PROMETHAZINE HCL 25 MG/ML IJ SOLN
12.5000 mg | Freq: Four times a day (QID) | INTRAMUSCULAR | Status: DC | PRN
  Administered 2018-07-12: 12.5 mg via INTRAVENOUS
  Filled 2018-07-11: qty 1

## 2018-07-11 MED ORDER — IOHEXOL 300 MG/ML  SOLN
75.0000 mL | Freq: Once | INTRAMUSCULAR | Status: AC | PRN
  Administered 2018-07-11: 75 mL via INTRAVENOUS

## 2018-07-11 MED ORDER — ONDANSETRON HCL 4 MG PO TABS
4.0000 mg | ORAL_TABLET | Freq: Four times a day (QID) | ORAL | Status: DC | PRN
  Administered 2018-07-12: 4 mg via ORAL
  Filled 2018-07-11: qty 1

## 2018-07-11 MED ORDER — ACETAMINOPHEN 325 MG PO TABS: 650.0000 mg | ORAL_TABLET | Freq: Four times a day (QID) | ORAL | Status: DC | PRN

## 2018-07-11 MED ORDER — SODIUM CHLORIDE 0.9 % IV SOLN
2.0000 g | Freq: Once | INTRAVENOUS | Status: AC
  Administered 2018-07-11: 2 g via INTRAVENOUS
  Filled 2018-07-11: qty 2

## 2018-07-11 MED ORDER — SODIUM CHLORIDE 0.9 % IV SOLN: 1000.0000 mL | INTRAVENOUS | Status: DC

## 2018-07-11 MED ORDER — LACTATED RINGERS IV BOLUS (SEPSIS)
250.0000 mL | Freq: Once | INTRAVENOUS | Status: AC
  Administered 2018-07-11: 250 mL via INTRAVENOUS

## 2018-07-11 NOTE — ED Provider Notes (Signed)
[facility] DEPT Provider Note   CSN: [id number] Arrival date & time: 07/11/18  1345   History   Chief Complaint Chief Complaint  Patient presents with  . Weakness  . Nausea  . Emesis    HPI [name] is a 72 y.o. female with a past medical history significant for diabetes, hypertension, hyperlipidemia, RA who presents for evaluation of productive cough as well as nausea and vomiting.  Patient states she was seen by her PCP this morning for 1 week history of persistent nausea and vomiting as well as productive cough.  Patient states she has had abdominal pain over the last week.  Notes to subjective fever and chills, nausea vomiting, productive cough of green sputum, shortness of breath and abdominal pain.  Denies chest pain, headache, vision changes, dysuria, constipation.  States she has felt overall weakness since onset of symptoms approximately weeks ago.  Patient states her symptoms have been increasing in nature. Denies hx PE, DVT, unilateral leg swelling, cough with hemoptysis, anticoagulation.  Patient states her primary care provider was concerned for pneumonia.  History obtained from patient.  No interpreter was used.  HPI  Past Medical History:  Diagnosis Date  . Arthritis    osteoarthritis  . Bursitis of right shoulder   . Chronic cholecystitis with calculus 05/04/2013  . 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 Diabetes type 2, controlled ([place]) 05/04/2013  . Fatty liver disease, nonalcoholic 2355   hospitalized, MRCP dx fatty liver  . GERD (gastroesophageal reflux disease)   . Hormone replacement therapy (postmenopausal)   . Hyperlipidemia   . Hypertension   . Normal cardiac stress test    pt can't remember when or where  . Obesity, unspecified 12/27/2012  . Rheumatoid arthritis(714.0) 09/28/2012  . 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 Unspecified essential hypertension 05/04/2013     Patient Active Problem List   Diagnosis Date Noted  . Diabetes mellitus type 2, uncomplicated ([place]) 73/22/0254  . History of total bilateral knee replacement 06/22/2017  . History of hypertension 08/20/2016  . High risk medication use 08/20/2016  . Primary osteoarthritis of both hands 08/20/2016  . Hypomagnesemia 07/21/2016  . Left thyroid nodule 03/05/2016  . Diabetic eye exam ([place]) 10/15/2014  . Multiple pulmonary nodules 04/04/2014  . Malnutrition of moderate degree ([place]) 03/18/2014  . Sepsis ([place]) 03/16/2014  . Anemia 11/21/2013  . 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 Bursitis of right shoulder   . GERD (gastroesophageal reflux disease)   . Morbid obesity due to excess calories ([place]) 12/27/2012  . 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 without organ or systems involvement ([place])   . Essential hypertension, benign 10/14/2012  . Hyperlipidemia   . Hormone replacement therapy (postmenopausal)   . Fatty liver disease, nonalcoholic   . Osteoarthritis of right knee 08/05/2012    Past Surgical History:  Procedure Laterality Date  . ABDOMINAL HYSTERECTOMY    . CHOLECYSTECTOMY N/A 05/04/2013   Procedure: LAPAROSCOPIC CHOLECYSTECTOMY WITH INTRAOPERATIVE CHOLANGIOGRAM;  Surgeon: Haywood Lasso, MD;  Location: [facility];  Service: General;  Laterality: N/A;  . ERCP N/A 05/02/2013   Procedure: ENDOSCOPIC RETROGRADE CHOLANGIOPANCREATOGRAPHY (ERCP);  Surgeon: Beryle Beams, MD;  Location: [facility];  Service: Endoscopy;  Laterality: N/A;  . JOINT REPLACEMENT     left knee  . NASAL SINUS SURGERY    . SPHINCTEROTOMY  05/02/2013   Procedure: SPHINCTEROTOMY;  Surgeon: Beryle Beams, MD;  Location: [facility];  Service: Endoscopy;;  . TONSILLECTOMY    . TOTAL KNEE ARTHROPLASTY  08/01/2012   Procedure: TOTAL KNEE ARTHROPLASTY;  Surgeon: Meredith Pel, MD;  Location: [facility];  Service: Orthopedics;  Laterality: Right;  Right total knee arthroplasty  . TOTAL KNEE  ARTHROPLASTY  08/01/2012   RIGHT  KNEE     OB History   None      Home Medications    Prior to Admission medications   Medication Sig Start Date End Date Taking? Authorizing Provider  amLODipine (NORVASC) 5 MG tablet TAKE 1 TABLET BY MOUTH EVERY DAY 07/11/18  Yes Susy Frizzle, MD  aspirin EC 81 MG tablet Take 81 mg by mouth daily.   Yes [provider]  atorvastatin (LIPITOR) 10 MG tablet TAKE 1 TABLET DAILY 01/31/18  Yes Dena Billet B, PA-C  Calcium Carbonate-Vitamin D (CALCIUM 600 + D PO) Take 1 tablet by mouth 2 (two) times daily.   Yes [provider]  cyanocobalamin 500 MCG tablet Take 1 tablet (500 mcg total) by mouth daily. 03/20/14  Yes Caren Griffins, MD  diclofenac sodium (VOLTAREN) 1 % GEL Apply 3 g topically daily as needed (pain). To 3 large joints   Yes [provider]  estradiol (ESTRACE) 0.5 MG tablet Take 0.5 mg by mouth 2 (two) times daily.   Yes [provider]  Ferrous Sulfate (IRON) 325 (65 Fe) MG TABS Take 1 tablet by mouth daily.    Yes [provider]  fish oil-omega-3 fatty acids 1000 MG capsule Take 1 g by mouth at bedtime.    Yes [provider]  folic acid (FOLVITE) 1 MG tablet TAKE 1 TABLET BY MOUTH TWICE A DAY 05/22/18  Yes Dixon, Mary B, PA-C  leflunomide (ARAVA) 10 MG tablet TAKE 1 TABLET BY MOUTH EVERY DAY 05/23/18  Yes Deveshwar, Abel Presto, MD  losartan-hydrochlorothiazide (HYZAAR) 100-12.5 MG tablet Take 1 tablet by mouth daily. 06/01/18  Yes [provider]  magnesium oxide (MAG-OX) 400 MG tablet Take 2 tablets 47m (total 8035m in the morning, and 40052mn the evening Patient taking differently: Take 800 mg by mouth 2 (two) times daily.  01/20/18  Yes DixOrlena SheldonA-C  metFORMIN (GLUCOPHAGE) 500 MG tablet Take 500 mg by mouth 2 (two) times daily with a meal.  05/05/18  Yes [provider]  Multiple Vitamins-Minerals (MULTIVITAMINS THER. W/MINERALS) TABS Take 1 tablet by mouth  daily.   Yes [provider]  omeprazole (PRILOSEC) 20 MG capsule TAKE 1 CAPSULE BY MOUTH EVERY DAY 01/09/18  Yes DixDena Billet PA-C  cloNIDine (CATAPRES) 0.3 MG tablet TAKE 1 TABLET (0.3 MG TOTAL) BY MOUTH 2 (TWO) TIMES DAILY. 02/14/18   Dixon, Mary B, PA-C  ENBREL SURECLICK 50 MG/ML injection INJECT 50 MG (0.98 ML) UNDER THE SKIN ONCE A WEEK Patient taking differently: Inject 50 mg into the skin once a week.  08/19/16   Panwala, Naitik, PA-C  losartan (COZAAR) 100 MG tablet Please specify directions, refills and quantity Patient not taking: Reported on 07/11/2018 06/01/18   DixOrlena SheldonA-C  metFORMIN (GLUCOPHAGE) 1000 MG tablet Take 1 tablet (1,000 mg total) by mouth 2 (two) times daily with a meal. Patient not taking: Reported on 07/11/2018 04/21/18   DixDena Billet PA-C  metoprolol tartrate (LOPRESSOR) 25 MG tablet TAKE 1/2 TABLET TWICE A DAY Patient taking differently: Take 12.5 mg by mouth 2 (two) times daily. TAKE 1/2 TABLET TWICE A DAY 04/05/18  Orlena Sheldon, PA-C    Family History Family History  Problem Relation Age of Onset  . Cancer Mother        breast  . Heart attack Mother   . Stroke Father   . Diabetes Sister   . Diabetes Daughter     Social History Social History   Tobacco Use  . Smoking status: Former Smoker    Packs/day: 0.25    Years: 2.00    Pack years: 0.50    Types: Cigarettes    Last attempt to quit: 11/21/2013    Years since quitting: 4.6  . Smokeless tobacco: Never Used  . Tobacco comment: smoked regulary for approx 10 yrs- stopped in her 68's- but still smokes occ cig/Aug 2015  Substance Use Topics  . Alcohol use: Yes    Comment: occasional  . Drug use: Never     Allergies   Monopril [fosinopril] and Norvasc [amlodipine besylate]   Review of Systems Review of Systems  Constitutional: Negative.   HENT: Negative.   Respiratory: Positive for cough and shortness of breath. Negative for apnea, choking, chest tightness, wheezing and  stridor.   Cardiovascular: Negative.   Gastrointestinal: Negative.   Genitourinary: Negative.   Musculoskeletal: Negative.   Skin: Negative.   Neurological: Negative.   All other systems reviewed and are negative.    Physical Exam Updated Vital Signs BP (!) 163/79   Pulse (!) 112   Temp 97.8 F (36.6 C) (Oral)   Resp 16   SpO2 98%   Physical Exam  Constitutional: She appears well-developed and well-nourished.  Non-toxic appearance. She appears ill. No distress.  Tachycardic  HENT:  Head: Normocephalic and atraumatic.  Right Ear: Tympanic membrane, external ear and ear canal normal. No drainage or swelling. Tympanic membrane is not perforated, not erythematous, not retracted and not bulging.  Left Ear: Tympanic membrane, external ear and ear canal normal. No drainage or swelling. Tympanic membrane is not perforated, not erythematous, not retracted and not bulging.  Nose: Nose normal. No mucosal edema or rhinorrhea. Right sinus exhibits no maxillary sinus tenderness and no frontal sinus tenderness. Left sinus exhibits no maxillary sinus tenderness and no frontal sinus tenderness.  Mouth/Throat: Uvula is midline, oropharynx is clear and moist and mucous membranes are normal. No trismus in the jaw. No uvula swelling. No oropharyngeal exudate, posterior oropharyngeal edema, posterior oropharyngeal erythema or tonsillar abscesses. No tonsillar exudate.  Eyes: Pupils are equal, round, and reactive to light.  Neck: Normal range of motion and full passive range of motion without pain.  Cardiovascular: Normal heart sounds, intact distal pulses and normal pulses. Tachycardia present.  Pulmonary/Chest: No respiratory distress.  Abdominal: She exhibits no distension.  Musculoskeletal: Normal range of motion.  Neurological: She is alert.  Skin: Skin is warm and dry. She is not diaphoretic.  Psychiatric: She has a normal mood and affect.  Nursing note and vitals reviewed.    ED Treatments  / Results  Labs (all labs ordered are listed, but only abnormal results are displayed) Labs Reviewed  COMPREHENSIVE METABOLIC PANEL - Abnormal; Notable for the following components:      Result Value   Potassium 3.0 (*)    Glucose, Bld 234 (*)    Creatinine, Ser 1.05 (*)    Total Protein 8.6 (*)    Total Bilirubin 1.3 (*)    GFR calc non Af Amer 51 (*)    GFR calc Af Amer 59 (*)    All other components within normal  limits  CBC WITH DIFFERENTIAL/PLATELET - Abnormal; Notable for the following components:   WBC 17.6 (*)    RBC 5.15 (*)    Hemoglobin 16.2 (*)    HCT 50.2 (*)    Neutro Abs 14.2 (*)    Monocytes Absolute 1.9 (*)    Abs Immature Granulocytes 0.18 (*)    All other components within normal limits  URINALYSIS, ROUTINE W REFLEX MICROSCOPIC - Abnormal; Notable for the following components:   Glucose, UA 150 (*)    Hgb urine dipstick SMALL (*)    Ketones, ur 20 (*)    Protein, ur >=300 (*)    Leukocytes, UA LARGE (*)    Bacteria, UA RARE (*)    All other components within normal limits  I-STAT CG4 LACTIC ACID, ED - Abnormal; Notable for the following components:   Lactic Acid, Venous 2.25 (*)    All other components within normal limits  CULTURE, BLOOD (ROUTINE X 2)  CULTURE, BLOOD (ROUTINE X 2)  LACTIC ACID, PLASMA  INFLUENZA PANEL BY PCR (TYPE A & B)  I-STAT CG4 LACTIC ACID, ED    EKG EKG Interpretation  Date/Time:  Tuesday July 11 2018 15:19:20 EST Ventricular Rate:  95 PR Interval:    QRS Duration: 89 QT Interval:  367 QTC Calculation: 462 R Axis:   72 Text Interpretation:  Sinus tachycardia Multiple premature complexes, vent & supraven Short PR interval Low voltage, extremity leads Anteroseptal infarct, old Nonspecific T abnormalities, lateral leads No significant change was found Confirmed by Jola Schmidt [phone]) on 07/11/2018 4:23:17 PM   Radiology Dg Chest 2 View  Result Date: 07/11/2018 CLINICAL DATA:  Cough EXAM: CHEST - 2 VIEW COMPARISON:   06/03/2016 chest radiograph. FINDINGS: Stable cardiomediastinal silhouette with normal heart size. No pneumothorax. No pleural effusion. Lungs appear clear, with no acute consolidative airspace disease and no pulmonary edema. IMPRESSION: No active cardiopulmonary disease. Electronically Signed   By: Ilona Sorrel M.D.   On: 07/11/2018 15:51   Ct Chest W Contrast  Result Date: 07/11/2018 CLINICAL DATA:  Cough with generalized weakness, nausea and vomiting x1 week. Periumbilical pain x1 day. Low EGFR 51. EXAM: CT CHEST, ABDOMEN, AND PELVIS WITH CONTRAST TECHNIQUE: Multidetector CT imaging of the chest, abdomen and pelvis was performed following the standard protocol during bolus administration of intravenous contrast. CONTRAST:  30m OMNIPAQUE IOHEXOL 300 MG/ML  SOLN COMPARISON:  None. FINDINGS: CT CHEST FINDINGS Cardiovascular: Normal size heart with dense left main and three-vessel coronary arteriosclerosis. No pericardial effusion or thickening. Aortic atherosclerosis without dissection or aneurysm. Suboptimal assessment of the pulmonary arteries beyond the proximal lobar level due to contrast volume and timing. No large central pulmonary embolus is identified. Patent great vessels with atherosclerosis. Mediastinum/Nodes: Dominant nodule in the left thyroid gland measuring 16 x 13 mm, series 2/11. No thyromegaly. Patent trachea and mainstem bronchi. No mediastinal nor hilar adenopathy. The esophagus is unremarkable. Lungs/Pleura: No confluent airspace disease. Dependent atelectasis at each base. No effusion or pneumothorax. Musculoskeletal: Small bone islands of the T6 vertebral body. Thoracic spondylosis with multi level mild disc space narrowing and endplate spurring. No aggressive osseous lesions. CT ABDOMEN PELVIS FINDINGS Hepatobiliary: No focal liver abnormality is seen. Status post cholecystectomy. No biliary dilatation. Pancreas: Unremarkable. No pancreatic ductal dilatation or surrounding inflammatory  changes. Spleen: Normal in size without focal abnormality. Adrenals/Urinary Tract: Normal bilateral adrenal glands. Simple cortical and exophytic cysts are noted of the right kidney the largest arising off the posterior aspect measuring 8.3 x 6.3  x 5.9 cm. Small parapelvic and cortical cysts are noted of the left kidney. Tiny nonobstructing interpolar calculus is seen of the left kidney. No obstructive uropathy. Symmetric pyelograms are noted both kidneys. Urinary bladder is opacified with contrast and demonstrates no focal mural thickening, diverticulum or intraluminal mass. Stomach/Bowel: Stomach is within normal limits. Appendix appears normal and contrast filled. No evidence of bowel wall thickening, distention, or inflammatory changes. Scattered colonic diverticulosis without acute diverticulitis. Vascular/Lymphatic: Aortoiliac atherosclerosis without aneurysm or adenopathy. Reproductive: Hysterectomy. No adnexal mass. Other: No free air nor free fluid. Musculoskeletal: Dextroconvex scoliosis of the thoracic spine with lumbar spondylosis. IMPRESSION: 1. 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 2. No large central pulmonary embolus. Suboptimal assessment of the pulmonary arteries beyond the proximal lobar level due to contrast volume and timing. 3. Dominant nodule in the left thyroid gland measuring 16 x 13 mm. This can be correlated with thyroid ultrasound. This follows ACR consensus guidelines: Managing Incidental Thyroid Nodules Detected on Imaging: White Paper of the ACR Incidental Thyroid Findings Committee. J Am Coll Radiol 2015; 12:143-150. 4. Simple and exophytic cysts of the right kidney measuring 8.3 x 6.3 x 5.9 cm. Tiny nonobstructing interpolar calculus of the left kidney. 5. Colonic diverticulosis without acute diverticulitis. 6. Dextroconvex scoliosis of the thoracic spine with lumbar spondylosis. Aortic Atherosclerosis (ICD10-I70.0). Electronically Signed   By: Ashley Royalty M.D.    On: 07/11/2018 18:23   Ct Abdomen Pelvis W Contrast  Result Date: 07/11/2018 CLINICAL DATA:  Cough with generalized weakness, nausea and vomiting x1 week. Periumbilical pain x1 day. Low EGFR 51. EXAM: CT CHEST, ABDOMEN, AND PELVIS WITH CONTRAST TECHNIQUE: Multidetector CT imaging of the chest, abdomen and pelvis was performed following the standard protocol during bolus administration of intravenous contrast. CONTRAST:  102m OMNIPAQUE IOHEXOL 300 MG/ML  SOLN COMPARISON:  None. FINDINGS: CT CHEST FINDINGS Cardiovascular: Normal size heart with dense left main and three-vessel coronary arteriosclerosis. No pericardial effusion or thickening. Aortic atherosclerosis without dissection or aneurysm. Suboptimal assessment of the pulmonary arteries beyond the proximal lobar level due to contrast volume and timing. No large central pulmonary embolus is identified. Patent great vessels with atherosclerosis. Mediastinum/Nodes: Dominant nodule in the left thyroid gland measuring 16 x 13 mm, series 2/11. No thyromegaly. Patent trachea and mainstem bronchi. No mediastinal nor hilar adenopathy. The esophagus is unremarkable. Lungs/Pleura: No confluent airspace disease. Dependent atelectasis at each base. No effusion or pneumothorax. Musculoskeletal: Small bone islands of the T6 vertebral body. Thoracic spondylosis with multi level mild disc space narrowing and endplate spurring. No aggressive osseous lesions. CT ABDOMEN PELVIS FINDINGS Hepatobiliary: No focal liver abnormality is seen. Status post cholecystectomy. No biliary dilatation. Pancreas: Unremarkable. No pancreatic ductal dilatation or surrounding inflammatory changes. Spleen: Normal in size without focal abnormality. Adrenals/Urinary Tract: Normal bilateral adrenal glands. Simple cortical and exophytic cysts are noted of the right kidney the largest arising off the posterior aspect measuring 8.3 x 6.3 x 5.9 cm. Small parapelvic and cortical cysts are noted of the  left kidney. Tiny nonobstructing interpolar calculus is seen of the left kidney. No obstructive uropathy. Symmetric pyelograms are noted both kidneys. Urinary bladder is opacified with contrast and demonstrates no focal mural thickening, diverticulum or intraluminal mass. Stomach/Bowel: Stomach is within normal limits. Appendix appears normal and contrast filled. No evidence of bowel wall thickening, distention, or inflammatory changes. Scattered colonic diverticulosis without acute diverticulitis. Vascular/Lymphatic: Aortoiliac atherosclerosis without aneurysm or adenopathy. Reproductive: Hysterectomy. No adnexal mass. Other: No free air nor free fluid.  Musculoskeletal: Dextroconvex scoliosis of the thoracic spine with lumbar spondylosis. IMPRESSION: 1. 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 2. No large central pulmonary embolus. Suboptimal assessment of the pulmonary arteries beyond the proximal lobar level due to contrast volume and timing. 3. Dominant nodule in the left thyroid gland measuring 16 x 13 mm. This can be correlated with thyroid ultrasound. This follows ACR consensus guidelines: Managing Incidental Thyroid Nodules Detected on Imaging: White Paper of the ACR Incidental Thyroid Findings Committee. J Am Coll Radiol 2015; 12:143-150. 4. Simple and exophytic cysts of the right kidney measuring 8.3 x 6.3 x 5.9 cm. Tiny nonobstructing interpolar calculus of the left kidney. 5. Colonic diverticulosis without acute diverticulitis. 6. Dextroconvex scoliosis of the thoracic spine with lumbar spondylosis. Aortic Atherosclerosis (ICD10-I70.0). Electronically Signed   By: Ashley Royalty M.D.   On: 07/11/2018 18:23    Procedures .Critical Care Performed by: Nettie Elm, PA-C Authorized by: Nettie Elm, PA-C   Critical care provider statement:    Critical care time (minutes):  35   Critical care was necessary to treat or prevent imminent or life-threatening deterioration of  the following conditions:  Sepsis   Critical care was time spent personally by me on the following activities:  Discussions with consultants, evaluation of patient's response to treatment, examination of patient, ordering and performing treatments and interventions, ordering and review of laboratory studies, ordering and review of radiographic studies, pulse oximetry, re-evaluation of patient's condition, obtaining history from patient or surrogate and review of old charts   (including critical care time)  Medications Ordered in ED Medications  0.9 %  sodium chloride infusion (has no administration in time range)  metroNIDAZOLE (FLAGYL) IVPB 500 mg (0 mg Intravenous Stopped 07/11/18 1818)  sodium chloride (PF) 0.9 % injection (has no administration in time range)  promethazine (PHENERGAN) injection 12.5 mg (has no administration in time range)  lactated ringers bolus 1,000 mL (0 mLs Intravenous Stopped 07/11/18 1605)    And  lactated ringers bolus 1,000 mL (0 mLs Intravenous Stopped 07/11/18 1605)    And  lactated ringers bolus 250 mL (0 mLs Intravenous Stopped 07/11/18 1635)  ceFEPIme (MAXIPIME) 2 g in sodium chloride 0.9 % 100 mL IVPB (0 g Intravenous Stopped 07/11/18 1605)  vancomycin (VANCOCIN) 1,500 mg in sodium chloride 0.9 % 500 mL IVPB (0 mg Intravenous Stopped 07/11/18 1818)  hydrALAZINE (APRESOLINE) injection 10 mg (10 mg Intravenous Given 07/11/18 1645)  ondansetron (ZOFRAN) injection 4 mg (4 mg Intravenous Given 07/11/18 1707)  iohexol (OMNIPAQUE) 300 MG/ML solution 75 mL (75 mLs Intravenous Contrast Given 07/11/18 1724)     Initial Impression / Assessment and Plan / ED Course  I have reviewed the triage vital signs and the nursing notes.  Pertinent labs & imaging results that were available during my care of the patient were reviewed by me and considered in my medical decision making (see chart for details).  72 year old female who presents for evaluation of nausea, vomiting  and productive cough.  Patient is afebrile on initial evaluation, however she is tachycardic at 137 with a new onset oxygen requirement of 3 L.  Patient was at 87% on room air with good waveform in room.  Patient at 98% with 3 L oxygen.  She does not appear in any respiratory distress during my initial exam while on supplemental oxygen.  She is able to speak in full sentences without difficulty.  Patient appears overall unwell.  Patient with mild rhonchi to bilateral lower lung fields.  Generalized abdominal tenderness without rebound or guarding.  Concern for aspiration pneumonia given history of persistent nausea and vomiting.  Review patient's past medical history and it appears her physician was concerned for pneumonia with tachycardia as well as new onset oxygen requirement during her visit today.  Will obtain rectal temperature.  Will call code sepsis for unknown etiology given pulmonary and abdominal complaints.   On reevaluation patient requiring 2 L of oxygen. Does not appear in any acute distress. Has persistent nausea, will give Zofran. Lactic acid 2.25. Labs with leukocytosis at 17.6, hemoglobin 16.2. Mild hypokalemia at 3.0, elevated glucose at 234, increased Bili at 1.3.   1625: Patient with BP of 214/123 will give Hydralazine 22m IV. No chest pain, SOB, headache or vision changes.   1715: Urinalysis with Large leukocytes, rare bacteria. Intractable nausea and emesis. Will give Phenergan.  1830: CT abdomen negative, CT chest negative for large PE or infiltrates. EKG with sinus tachycardia, no evidence of ST changes or acute ischemia.  Patient will need to be admitted for tachycardia as well as new onset oxygen requirement with possible sepsis of unknown etiology.  1915: Consulted with Dr. OMyna Hidalgowith [facility] hospitalist who agrees for admission of patient.  Clinical Course as of Jul 11 2149  Tue Jul 11, 2018  2141 Leukocytosis  CBC WITH DIFFERENTIAL(!) [BH]  2141 Hypokalemia at 3.0,  elevated glucose at 234, hx diabetes  Comprehensive metabolic panel(!) [BH]  22841Elevated lactic acid  I-Stat CG4 Lactic Acid, ED(!!) [BH]  2142 Large Leukocytes, negative nitraite, rare bacteria.  Urinalysis, Routine w reflex microscopic(!) [BH]  2142 Influenza negative  Influenza panel by PCR (type A & B) [BH]  2142 Negative  CT ABDOMEN PELVIS W CONTRAST [BH]  2143 Negative  CT Chest W Contrast [BH]  2143 Negative for infiltrates, CHF, pulmonary edema  DG Chest 2 View [BH]  2143 Sinus tachycardia without evidence of ischemia  ED EKG 12-Lead [BH]    Clinical Course User Index [BH] Dimetrius Montfort A, PA-C    Final Clinical Impressions(s) / ED Diagnoses   Final diagnoses:  None    ED Discharge Orders    None       Maraya Gwilliam A, PA-C 07/11/18 2155    CJola Schmidt MD 07/12/18 1549

## 2018-07-11 NOTE — Patient Instructions (Signed)
Sent to ER for work up

## 2018-07-11 NOTE — Progress Notes (Signed)
A consult was received from an ED physician for vancomycin and cefepime per pharmacy dosing.  The patient's profile has been reviewed for ht/wt/allergies/indication/available labs.    A one time order has been placed for cefepime 2 g IV + vancomycin 1500 mg IV once.  Further antibiotics/pharmacy consults should be ordered by admitting physician if indicated.                       Thank you, Lenis Noon, PharmD Clinical Pharmacist 07/11/2018  3:05 PM

## 2018-07-11 NOTE — Progress Notes (Signed)
Pharmacy Antibiotic Note  [name] is a 72 y.o. female admitted on 07/11/2018 with sepsis of unknown origin.  Pharmacy has been consulted for vancomycin and cefepime dosing.  Plan:  Vancomycin 1500 mg IV given in ED, then 750 mg IV q36 hr (est AUC 496 based on SCr 1.05)  Measure vancomycin AUC at steady state as indicated  Cefepime 1 g IV q12 hr  SCr daily while on vancomycin      Temp (24hrs), Avg:97.7 F (36.5 C), Min:97.6 F (36.4 C), Max:97.8 F (36.6 C)  Recent Labs  Lab 07/11/18 1532 07/11/18 1543  WBC 17.6*  --   CREATININE 1.05*  --   LATICACIDVEN  --  2.25*    Estimated Creatinine Clearance: 39.3 mL/min (A) (by C-G formula based on SCr of 1.05 mg/dL (H)).    Allergies  Allergen Reactions  . Monopril [Fosinopril] Cough  . Norvasc [Amlodipine Besylate] Other (See Comments)    10 mg dose causes LE edema.  Tolerates 5mg  dose.    Thank you for allowing pharmacy to be a part of this patient's care.  Reuel Boom, PharmD, BCPS [phone] 07/11/2018, 8:41 PM

## 2018-07-11 NOTE — ED Notes (Signed)
Spoke with Pharmacy. Will verify meds since holding pt in ED

## 2018-07-11 NOTE — ED Notes (Signed)
Bed: WA20 Expected date:  Expected time:  Means of arrival:  Comments: EMS/ weak 

## 2018-07-11 NOTE — H&P (Signed)
History and Physical    [name] MLJ:[id number] DOB: April 09, 1946 DOA: 07/11/2018  PCP: Orlena Sheldon, PA-C   Patient coming from: Home   Chief Complaint: Gen weakness, rhinorrhea, cough, N/V   HPI: [name] is a 72 y.o. female with medical history significant for rheumatoid arthritis, type 2 diabetes mellitus, hypertension, now presenting to the emergency department for evaluation of generalized weakness, nausea with nonbloody vomiting, rhinorrhea, and cough.  Patient reports that symptoms developed several days ago with rhinorrhea, cough, and nausea.  She then developed a cough, often productive of thick clear and yellow sputum, as well as nonbloody vomiting.  Over the ensuing days, she has developed progressive generalized weakness which is her main complaint.  She also complains of severe nausea with recurrent vomiting.  She denies abdominal pain and denies fevers.  She was evaluated for these complaints today at her outpatient clinic and was directed to the ED for further evaluation.  She has not received a seasonal influenza vaccine this year.  ED Course: Upon arrival to the ED, patient is found to be afebrile, saturating low 90s on room air, tachycardic in the 130s, and hypertensive to 210/120.  EKG features sinus rhythm with PVCs and nonspecific lateral T wave abnormality.  Chest x-ray is negative for acute cardiopulmonary disease.  CT of the chest, abdomen, and pelvis is negative for pulmonary or other acute findings, but notable incidentally for thyroid nodule.  CBC is notable for leukocytosis to 17,600 and mild polycythemia.  CT features a potassium 3.0 and glucose 234.  Lactic acid is elevated to 2.23.  Blood cultures were collected, 2.25 L of lactated Ringer's administered, and the patient was treated with hydralazine, Zofran, Phenergan, cefepime, vancomycin, and Flagyl in the ED.  Tachycardia improved, blood pressure remained stable, and patient will be observed for ongoing evaluation  and management.   Review of Systems:  All other systems reviewed and apart from HPI, are negative.  Past Medical History:  Diagnosis Date  . Arthritis    osteoarthritis  . Bursitis of right shoulder   . Chronic cholecystitis with calculus 05/04/2013  . 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 Diabetes type 2, controlled ([place]) 05/04/2013  . Fatty liver disease, nonalcoholic 1219   hospitalized, MRCP dx fatty liver  . GERD (gastroesophageal reflux disease)   . Hormone replacement therapy (postmenopausal)   . Hyperlipidemia   . Hypertension   . Normal cardiac stress test    pt can't remember when or where  . Obesity, unspecified 12/27/2012  . Rheumatoid arthritis(714.0) 09/28/2012  . 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 Unspecified essential hypertension 05/04/2013    Past Surgical History:  Procedure Laterality Date  . ABDOMINAL HYSTERECTOMY    . CHOLECYSTECTOMY N/A 05/04/2013   Procedure: LAPAROSCOPIC CHOLECYSTECTOMY WITH INTRAOPERATIVE CHOLANGIOGRAM;  Surgeon: Haywood Lasso, MD;  Location: [facility];  Service: General;  Laterality: N/A;  . ERCP N/A 05/02/2013   Procedure: ENDOSCOPIC RETROGRADE CHOLANGIOPANCREATOGRAPHY (ERCP);  Surgeon: Beryle Beams, MD;  Location: [facility];  Service: Endoscopy;  Laterality: N/A;  . JOINT REPLACEMENT     left knee  . NASAL SINUS SURGERY    . SPHINCTEROTOMY  05/02/2013   Procedure: SPHINCTEROTOMY;  Surgeon: Beryle Beams, MD;  Location: [facility];  Service: Endoscopy;;  . TONSILLECTOMY    . TOTAL KNEE ARTHROPLASTY  08/01/2012   Procedure: TOTAL KNEE ARTHROPLASTY;  Surgeon: Meredith Pel, MD;  Location: [place];  Service: Orthopedics;  Laterality: Right;  Right total knee arthroplasty  . TOTAL KNEE ARTHROPLASTY  08/01/2012   RIGHT  KNEE     reports that she quit smoking about 4 years ago. Her smoking use included cigarettes. She has a 0.50 pack-year smoking history. She has never used  smokeless tobacco. She reports that she drinks alcohol. She reports that she does not use drugs.  Allergies  Allergen Reactions  . Monopril [Fosinopril] Cough  . Norvasc [Amlodipine Besylate] Other (See Comments)    10 mg dose causes LE edema.  Tolerates 40m dose.    Family History  Problem Relation Age of Onset  . Cancer Mother        breast  . Heart attack Mother   . Stroke Father   . Diabetes Sister   . Diabetes Daughter      Prior to Admission medications   Medication Sig Start Date End Date Taking? Authorizing Provider  amLODipine (NORVASC) 5 MG tablet TAKE 1 TABLET BY MOUTH EVERY DAY 07/11/18  Yes PSusy Frizzle MD  aspirin EC 81 MG tablet Take 81 mg by mouth daily.   Yes [provider]  atorvastatin (LIPITOR) 10 MG tablet TAKE 1 TABLET DAILY 01/31/18  Yes DDena BilletB, PA-C  Calcium Carbonate-Vitamin D (CALCIUM 600 + D PO) Take 1 tablet by mouth 2 (two) times daily.   Yes [provider]  cyanocobalamin 500 MCG tablet Take 1 tablet (500 mcg total) by mouth daily. 03/20/14  Yes GCaren Griffins MD  diclofenac sodium (VOLTAREN) 1 % GEL Apply 3 g topically daily as needed (pain). To 3 large joints   Yes [provider]  estradiol (ESTRACE) 0.5 MG tablet Take 0.5 mg by mouth 2 (two) times daily.   Yes [provider]  Ferrous Sulfate (IRON) 325 (65 Fe) MG TABS Take 1 tablet by mouth daily.    Yes [provider]  fish oil-omega-3 fatty acids 1000 MG capsule Take 1 g by mouth at bedtime.    Yes [provider]  folic acid (FOLVITE) 1 MG tablet TAKE 1 TABLET BY MOUTH TWICE A DAY 05/22/18  Yes Dixon, Mary B, PA-C  leflunomide (ARAVA) 10 MG tablet TAKE 1 TABLET BY MOUTH EVERY DAY 05/23/18  Yes Deveshwar, SAbel Presto MD  losartan-hydrochlorothiazide (HYZAAR) 100-12.5 MG tablet Take 1 tablet by mouth daily. 06/01/18  Yes [provider]  magnesium oxide (MAG-OX) 400 MG tablet Take 2 tablets 4060m(total 80086min the  morning, and 400m17m the evening Patient taking differently: Take 800 mg by mouth 2 (two) times daily.  01/20/18  Yes DixoOrlena Sheldon-C  metFORMIN (GLUCOPHAGE) 500 MG tablet Take 500 mg by mouth 2 (two) times daily with a meal.  05/05/18  Yes [provider]  Multiple Vitamins-Minerals (MULTIVITAMINS THER. W/MINERALS) TABS Take 1 tablet by mouth daily.   Yes [provider]  omeprazole (PRILOSEC) 20 MG capsule TAKE 1 CAPSULE BY MOUTH EVERY DAY 01/09/18  Yes DixoDena BilletPA-C  cloNIDine (CATAPRES) 0.3 MG tablet TAKE 1 TABLET (0.3 MG TOTAL) BY MOUTH 2 (TWO) TIMES DAILY. 02/14/18   Dixon, Mary B, PA-C  ENBREL SURECLICK 50 MG/ML injection INJECT 50 MG (0.98 ML) UNDER THE SKIN ONCE A WEEK Patient taking differently: Inject 50 mg into the skin once a week.  08/19/16   Panwala, Naitik, PA-C  metoprolol tartrate (LOPRESSOR) 25 MG tablet TAKE 1/2 TABLET TWICE A DAY Patient taking differently: Take 12.5 mg by mouth 2 (  two) times daily. TAKE 1/2 TABLET TWICE A DAY 04/05/18   Orlena Sheldon, [place]    Physical Exam: Vitals:   07/11/18 1647 07/11/18 1817 07/11/18 1900 07/11/18 1930  BP: (!) 210/92 (!) 163/79 (!) 176/102 (!) 180/89  Pulse: (!) 117 (!) 112 (!) 129 (!) 119  Resp: _0 Temp:      TempSrc:      SpO2: 100% 98% 100% 99%    Constitutional: NAD, listless Eyes: PERTLA, lids and conjunctivae normal ENMT: Mucous membranes are moist. Posterior pharynx clear of any exudate or lesions.   Neck: normal, supple, no masses, no thyromegaly Respiratory: clear to auscultation bilaterally, no wheezing, no crackles. Normal respiratory effort.    Cardiovascular: S1 & S2 heard, regular rate and rhythm. No extremity edema.   Abdomen: No distension, no tenderness, soft. Bowel sounds normal.  Musculoskeletal: no clubbing / cyanosis. No joint deformity upper and lower extremities.   Skin: no significant rashes, lesions, ulcers. Warm, dry, well-perfused. Neurologic: CN 2-12 grossly  intact. Sensation intact. Strength 5/5 in all 4 limbs.  Psychiatric: Alert and oriented x 3. Calm, cooperative.    Labs on Admission: I have personally reviewed following labs and imaging studies  CBC: Recent Labs  Lab 07/11/18 1532  WBC 17.6*  NEUTROABS 14.2*  HGB 16.2*  HCT 50.2*  MCV 97.5  PLT 468   Basic Metabolic Panel: Recent Labs  Lab 07/11/18 1532  NA 142  K 3.0*  CL 98  CO2 29  GLUCOSE 234*  BUN 21  CREATININE 1.05*  CALCIUM 9.4   GFR: Estimated Creatinine Clearance: 39.3 mL/min (A) (by C-G formula based on SCr of 1.05 mg/dL (H)). Liver Function Tests: Recent Labs  Lab 07/11/18 1532  AST 35  ALT 25  ALKPHOS 125  BILITOT 1.3*  PROT 8.6*  ALBUMIN 4.0   No results for input(s): LIPASE, AMYLASE in the last 168 hours. No results for input(s): AMMONIA in the last 168 hours. Coagulation Profile: No results for input(s): INR, PROTIME in the last 168 hours. Cardiac Enzymes: No results for input(s): CKTOTAL, CKMB, CKMBINDEX, TROPONINI in the last 168 hours. BNP (last 3 results) No results for input(s): PROBNP in the last 8760 hours. HbA1C: No results for input(s): HGBA1C in the last 72 hours. CBG: No results for input(s): GLUCAP in the last 168 hours. Lipid Profile: No results for input(s): CHOL, HDL, LDLCALC, TRIG, CHOLHDL, LDLDIRECT in the last 72 hours. Thyroid Function Tests: No results for input(s): TSH, T4TOTAL, FREET4, T3FREE, THYROIDAB in the last 72 hours. Anemia Panel: No results for input(s): VITAMINB12, FOLATE, FERRITIN, TIBC, IRON, RETICCTPCT in the last 72 hours. Urine analysis:    Component Value Date/Time   COLORURINE YELLOW 07/11/2018 [place] 07/11/2018 1719   LABSPEC 1.014 07/11/2018 1719   PHURINE 8.0 07/11/2018 1719   GLUCOSEU 150 (A) 07/11/2018 1719   HGBUR SMALL (A) 07/11/2018 1719   BILIRUBINUR NEGATIVE 07/11/2018 1719   KETONESUR 20 (A) 07/11/2018 1719   PROTEINUR >=300 (A) 07/11/2018 1719   UROBILINOGEN  1.0 03/16/2014 0628   NITRITE NEGATIVE 07/11/2018 1719   LEUKOCYTESUR LARGE (A) 07/11/2018 1719   Sepsis Labs: _1 (procalcitonin:4,lacticidven:4) )No results found for this or any previous visit (from the past 240 hour(s)).   Radiological Exams on Admission: Dg Chest 2 View  Result Date: 07/11/2018 CLINICAL DATA:  Cough EXAM: CHEST - 2 VIEW COMPARISON:  06/03/2016 chest radiograph. FINDINGS: Stable cardiomediastinal silhouette with normal heart size. No pneumothorax. No pleural  effusion. Lungs appear clear, with no acute consolidative airspace disease and no pulmonary edema. IMPRESSION: No active cardiopulmonary disease. Electronically Signed   By: Ilona Sorrel M.D.   On: 07/11/2018 15:51   Ct Chest W Contrast  Result Date: 07/11/2018 CLINICAL DATA:  Cough with generalized weakness, nausea and vomiting x1 week. Periumbilical pain x1 day. Low EGFR 51. EXAM: CT CHEST, ABDOMEN, AND PELVIS WITH CONTRAST TECHNIQUE: Multidetector CT imaging of the chest, abdomen and pelvis was performed following the standard protocol during bolus administration of intravenous contrast. CONTRAST:  74m OMNIPAQUE IOHEXOL 300 MG/ML  SOLN COMPARISON:  None. FINDINGS: CT CHEST FINDINGS Cardiovascular: Normal size heart with dense left main and three-vessel coronary arteriosclerosis. No pericardial effusion or thickening. Aortic atherosclerosis without dissection or aneurysm. Suboptimal assessment of the pulmonary arteries beyond the proximal lobar level due to contrast volume and timing. No large central pulmonary embolus is identified. Patent great vessels with atherosclerosis. Mediastinum/Nodes: Dominant nodule in the left thyroid gland measuring 16 x 13 mm, series 2/11. No thyromegaly. Patent trachea and mainstem bronchi. No mediastinal nor hilar adenopathy. The esophagus is unremarkable. Lungs/Pleura: No confluent airspace disease. Dependent atelectasis at each base. No effusion or pneumothorax. Musculoskeletal:  Small bone islands of the T6 vertebral body. Thoracic spondylosis with multi level mild disc space narrowing and endplate spurring. No aggressive osseous lesions. CT ABDOMEN PELVIS FINDINGS Hepatobiliary: No focal liver abnormality is seen. Status post cholecystectomy. No biliary dilatation. Pancreas: Unremarkable. No pancreatic ductal dilatation or surrounding inflammatory changes. Spleen: Normal in size without focal abnormality. Adrenals/Urinary Tract: Normal bilateral adrenal glands. Simple cortical and exophytic cysts are noted of the right kidney the largest arising off the posterior aspect measuring 8.3 x 6.3 x 5.9 cm. Small parapelvic and cortical cysts are noted of the left kidney. Tiny nonobstructing interpolar calculus is seen of the left kidney. No obstructive uropathy. Symmetric pyelograms are noted both kidneys. Urinary bladder is opacified with contrast and demonstrates no focal mural thickening, diverticulum or intraluminal mass. Stomach/Bowel: Stomach is within normal limits. Appendix appears normal and contrast filled. No evidence of bowel wall thickening, distention, or inflammatory changes. Scattered colonic diverticulosis without acute diverticulitis. Vascular/Lymphatic: Aortoiliac atherosclerosis without aneurysm or adenopathy. Reproductive: Hysterectomy. No adnexal mass. Other: No free air nor free fluid. Musculoskeletal: Dextroconvex scoliosis of the thoracic spine with lumbar spondylosis. IMPRESSION: 1. 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 2. No large central pulmonary embolus. Suboptimal assessment of the pulmonary arteries beyond the proximal lobar level due to contrast volume and timing. 3. Dominant nodule in the left thyroid gland measuring 16 x 13 mm. This can be correlated with thyroid ultrasound. This follows ACR consensus guidelines: Managing Incidental Thyroid Nodules Detected on Imaging: White Paper of the ACR Incidental Thyroid Findings Committee. J Am Coll  Radiol 2015; 12:143-150. 4. Simple and exophytic cysts of the right kidney measuring 8.3 x 6.3 x 5.9 cm. Tiny nonobstructing interpolar calculus of the left kidney. 5. Colonic diverticulosis without acute diverticulitis. 6. Dextroconvex scoliosis of the thoracic spine with lumbar spondylosis. Aortic Atherosclerosis (ICD10-I70.0). Electronically Signed   By: DAshley RoyaltyM.D.   On: 07/11/2018 18:23   Ct Abdomen Pelvis W Contrast  Result Date: 07/11/2018 CLINICAL DATA:  Cough with generalized weakness, nausea and vomiting x1 week. Periumbilical pain x1 day. Low EGFR 51. EXAM: CT CHEST, ABDOMEN, AND PELVIS WITH CONTRAST TECHNIQUE: Multidetector CT imaging of the chest, abdomen and pelvis was performed following the standard protocol during bolus administration of intravenous contrast. CONTRAST:  766mOMNIPAQUE  IOHEXOL 300 MG/ML  SOLN COMPARISON:  None. FINDINGS: CT CHEST FINDINGS Cardiovascular: Normal size heart with dense left main and three-vessel coronary arteriosclerosis. No pericardial effusion or thickening. Aortic atherosclerosis without dissection or aneurysm. Suboptimal assessment of the pulmonary arteries beyond the proximal lobar level due to contrast volume and timing. No large central pulmonary embolus is identified. Patent great vessels with atherosclerosis. Mediastinum/Nodes: Dominant nodule in the left thyroid gland measuring 16 x 13 mm, series 2/11. No thyromegaly. Patent trachea and mainstem bronchi. No mediastinal nor hilar adenopathy. The esophagus is unremarkable. Lungs/Pleura: No confluent airspace disease. Dependent atelectasis at each base. No effusion or pneumothorax. Musculoskeletal: Small bone islands of the T6 vertebral body. Thoracic spondylosis with multi level mild disc space narrowing and endplate spurring. No aggressive osseous lesions. CT ABDOMEN PELVIS FINDINGS Hepatobiliary: No focal liver abnormality is seen. Status post cholecystectomy. No biliary dilatation. Pancreas:  Unremarkable. No pancreatic ductal dilatation or surrounding inflammatory changes. Spleen: Normal in size without focal abnormality. Adrenals/Urinary Tract: Normal bilateral adrenal glands. Simple cortical and exophytic cysts are noted of the right kidney the largest arising off the posterior aspect measuring 8.3 x 6.3 x 5.9 cm. Small parapelvic and cortical cysts are noted of the left kidney. Tiny nonobstructing interpolar calculus is seen of the left kidney. No obstructive uropathy. Symmetric pyelograms are noted both kidneys. Urinary bladder is opacified with contrast and demonstrates no focal mural thickening, diverticulum or intraluminal mass. Stomach/Bowel: Stomach is within normal limits. Appendix appears normal and contrast filled. No evidence of bowel wall thickening, distention, or inflammatory changes. Scattered colonic diverticulosis without acute diverticulitis. Vascular/Lymphatic: Aortoiliac atherosclerosis without aneurysm or adenopathy. Reproductive: Hysterectomy. No adnexal mass. Other: No free air nor free fluid. Musculoskeletal: Dextroconvex scoliosis of the thoracic spine with lumbar spondylosis. IMPRESSION: 1. 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 2. No large central pulmonary embolus. Suboptimal assessment of the pulmonary arteries beyond the proximal lobar level due to contrast volume and timing. 3. Dominant nodule in the left thyroid gland measuring 16 x 13 mm. This can be correlated with thyroid ultrasound. This follows ACR consensus guidelines: Managing Incidental Thyroid Nodules Detected on Imaging: White Paper of the ACR Incidental Thyroid Findings Committee. J Am Coll Radiol 2015; 12:143-150. 4. Simple and exophytic cysts of the right kidney measuring 8.3 x 6.3 x 5.9 cm. Tiny nonobstructing interpolar calculus of the left kidney. 5. Colonic diverticulosis without acute diverticulitis. 6. Dextroconvex scoliosis of the thoracic spine with lumbar spondylosis. Aortic  Atherosclerosis (ICD10-I70.0). Electronically Signed   By: Ashley Royalty M.D.   On: 07/11/2018 18:23    EKG: Independently reviewed. Sinus rhythm, PVC's, lateral T-wave abnormality.   Assessment/Plan  1. SIRS  - Presents with several days of N/V, rhinorrhea, and cough  - Found to be tachycardic in 130's with WBC 17,600 and lactate 2.23  - No urinary symptoms, meningismus, or wounds, and CT chest/abd/pelvis with no acute findings - Blood cultures collected in ED, 2.25 L IVF given, and she was treated with empiric vancomycin, cefepime, and Flagyl  - Most likely a viral illness, possibly influenza  - Add urine culture and influenza PCR, continue empiric abx for now, continue supportive care, and follow cultures and clinical course   2. Nausea, vomiting  - Abd exam benign and no acute findings on CT  - Suspected secondary to viral illness  - Continue IVF hydration, antiemetics, electrolyte repletion    3. Hypertension with hypertensive urgency  - BP as high as 210/120 in ED, treated with hydralazine  IVP - N/V likely contributing  - Treat N/V, continue Lopressor, losartan, clonidine, and Norvasc; hold HCTZ while hydrating    4. Type II DM  - A1c was 7.4% in August  - Managed at home with metformin, held on admission  - Check CBG's and use a low-intensity SSI with Novolog while in hospital    5. Hypokalemia  - Serum potassium is 3.0 on admission in setting of N/V  - KCl added to IVF and 20 mEq oral potassium given  - Continue cardiac monitoring, treat N/V, repeat chem panel in am    6. Rheumatoid arthritis  - Continue current management with Arava, Enbrel    7. Thyroid nodule  - Noted incidentally on CT  - Outpatient follow-up recommended    DVT prophylaxis: Lovenox  Code Status: Full  Family Communication: Discussed with patient  Consults called: none  Admission status: Observation     Vianne Bulls, MD [facility] Pager [phone]  If 7PM-7AM, please contact  night-coverage [email or web address] Password [email or web address]  07/11/2018, 8:22 PM

## 2018-07-11 NOTE — ED Triage Notes (Addendum)
EMS reports from PCP office, generalized weakness, Nausea and vomiting x 1 week. Sent due to tachycardia. Hx of Diabetes.  BP 174/100 HR 124 RR 22 Sp02 90 RA (98 on 2 ltrs) CBG 271

## 2018-07-11 NOTE — Progress Notes (Signed)
Patient ID: [name], female    DOB: 10-Feb-1946, 72 y.o.   MRN: [id number]  PCP: Orlena Sheldon, PA-C  Chief Complaint  Patient presents with  . Emesis    Patient states nothing is staying down. Onset 1 week ago. Has some mid chest pain, and back pain.    Subjective:   [name] is a 72 y.o. female, presents to clinic with CC of nausea vomiting for 1 week, is dehydrated very weak and fatigued.  Her symptoms have been worsening for 1 week, she states is been at least 4 days and she is been able to really keep them down.  She denies abdominal pain, denies fever.  Also has had a cold prior to this which resolved but then congestion and coughing remained this is also become much worse she feels tightness in her central chest that occurs with coughing.  She denies any chest pressure or chest pain without coughing.  She does feel slightly short of breath and has been waking up drenched in sweat for several days.  She states that she has persistent nausea but also coughing sometimes causes her to throw up.  Even if she drinks water sometimes this will come up.    She was around her granddaughter over week ago who was sick with a cold and then she became ill.   Is new to me, her PCP is currently not at this practice.  Patient does report a history of type 2 diabetes.  She denies any history of heart disease or lung disease.   She does have hx of DM type 2 - only on metformin, HTN, HLD, RA- on immunosuppresive meds Arava  Patient Active Problem List   Diagnosis Date Noted  . Diabetes mellitus type 2, uncomplicated ([place]) 80/99/8338  . History of total bilateral knee replacement 06/22/2017  . History of hypertension 08/20/2016  . High risk medication use 08/20/2016  . Primary osteoarthritis of both hands 08/20/2016  . Hypomagnesemia 07/21/2016  . Left thyroid nodule 03/05/2016  . Diabetic eye exam ([place]) 10/15/2014  . Multiple pulmonary nodules 04/04/2014  . Malnutrition of moderate degree  ([place]) 03/18/2014  . Sepsis ([place]) 03/16/2014  . Anemia 11/21/2013  . 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 Bursitis of right shoulder   . GERD (gastroesophageal reflux disease)   . Morbid obesity due to excess calories ([place]) 12/27/2012  . 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 without organ or systems involvement ([place])   . Essential hypertension, benign 10/14/2012  . Hyperlipidemia   . Hormone replacement therapy (postmenopausal)   . Fatty liver disease, nonalcoholic   . Osteoarthritis of right knee 08/05/2012     Prior to Admission medications   Medication Sig Start Date End Date Taking? Authorizing Provider  amLODipine (NORVASC) 5 MG tablet TAKE 1 TABLET BY MOUTH EVERY DAY 07/11/18  Yes Susy Frizzle, MD  aspirin EC 81 MG tablet Take 81 mg by mouth daily.   Yes [provider]  atorvastatin (LIPITOR) 10 MG tablet TAKE 1 TABLET DAILY 01/31/18  Yes Dena Billet B, PA-C  Calcium Carbonate-Vitamin D (CALCIUM 600 + D PO) Take 1 tablet by mouth 2 (two) times daily.   Yes [provider]  cloNIDine (CATAPRES) 0.3 MG tablet TAKE 1 TABLET (0.3 MG TOTAL) BY MOUTH 2 (TWO) TIMES DAILY. 02/14/18  Yes Orlena Sheldon, PA-C  cyanocobalamin 500 MCG tablet Take 1 tablet (500 mcg total) by mouth daily. 03/20/14  Yes Caren Griffins, MD  diclofenac sodium (VOLTAREN) 1 % GEL Apply 3 g topically daily as needed (pain). To 3 large joints   Yes [provider]  ENBREL SURECLICK 50 MG/ML injection INJECT 50 MG (0.98 ML) UNDER THE SKIN ONCE A WEEK 08/19/16  Yes Panwala, Naitik, PA-C  estradiol (ESTRACE) 0.5 MG tablet Take 0.5 mg by mouth 2 (two) times daily.   Yes [provider]  Ferrous Sulfate (IRON) 325 (65 Fe) MG TABS Take by mouth daily.   Yes [provider]  fish oil-omega-3 fatty acids 1000 MG capsule Take 1 g by mouth at bedtime.    Yes [provider]  folic acid (FOLVITE) 1 MG tablet TAKE 1 TABLET BY MOUTH TWICE A DAY 05/22/18  Yes  Dixon, Mary B, PA-C  leflunomide (ARAVA) 10 MG tablet TAKE 1 TABLET BY MOUTH EVERY DAY 05/23/18  Yes Deveshwar, Abel Presto, MD  losartan (COZAAR) 100 MG tablet Please specify directions, refills and quantity 06/01/18  Yes Dena Billet B, PA-C  magnesium oxide (MAG-OX) 400 MG tablet Take 2 tablets 400mg  (total 800mg ) in the morning, and 400mg  in the evening 01/20/18  Yes Dena Billet B, PA-C  metFORMIN (GLUCOPHAGE) 1000 MG tablet Take 1 tablet (1,000 mg total) by mouth 2 (two) times daily with a meal. 04/21/18  Yes Dena Billet B, PA-C  metoprolol tartrate (LOPRESSOR) 25 MG tablet TAKE 1/2 TABLET TWICE A DAY 04/05/18  Yes Orlena Sheldon, PA-C  Multiple Vitamins-Minerals (MULTIVITAMINS THER. W/MINERALS) TABS Take 1 tablet by mouth daily.   Yes [provider]  omeprazole (PRILOSEC) 20 MG capsule TAKE 1 CAPSULE BY MOUTH EVERY DAY 01/09/18  Yes Orlena Sheldon, PA-C     Allergies  Allergen Reactions  . Monopril [Fosinopril] Cough  . Norvasc [Amlodipine Besylate] Other (See Comments)    10 mg dose causes LE edema.  Tolerates 5mg  dose.     Family History  Problem Relation Age of Onset  . Cancer Mother        breast  . Heart attack Mother   . Stroke Father   . Diabetes Sister   . Diabetes Daughter      Social History   Socioeconomic History  . Marital status: Divorced    Spouse name: Not on file  . Number of children: Not on file  . Years of education: Not on file  . Highest education level: Not on file  Occupational History  . Not on file  Social Needs  . Financial resource strain: Not on file  . Food insecurity:    Worry: Not on file    Inability: Not on file  . Transportation needs:    Medical: Not on file    Non-medical: Not on file  Tobacco Use  . Smoking status: Former Smoker    Packs/day: 0.25    Years: 2.00    Pack years: 0.50    Types: Cigarettes    Last attempt to quit: 11/21/2013    Years since quitting: 4.6  . Smokeless tobacco: Never Used  . Tobacco comment:  smoked regulary for approx 10 yrs- stopped in her 24's- but still smokes occ cig/Aug 2015  Substance and Sexual Activity  . Alcohol use: Yes    Comment: occasional  . Drug use: Never  . Sexual activity: Never  Lifestyle  . Physical activity:    Days per week: Not on file    Minutes per session: Not on file  . Stress: Not on file  Relationships  . Social connections:  Talks on phone: Not on file    Gets together: Not on file    Attends religious service: Not on file    Active member of club or organization: Not on file    Attends meetings of clubs or organizations: Not on file    Relationship status: Not on file  . Intimate partner violence:    Fear of current or ex partner: Not on file    Emotionally abused: Not on file    Physically abused: Not on file    Forced sexual activity: Not on file  Other Topics Concern  . Not on file  Social History Narrative  . Not on file     Review of Systems  Unable to perform ROS: Acuity of condition       Objective:    Vitals:   07/11/18 1216  BP: (!) 140/98  Pulse: (!) 137  Resp: 20  Temp: 97.6 F (36.4 C)  TempSrc: Oral  SpO2: 96%  Weight: 156 lb 4 oz (70.9 kg)  Height: 4\' 10"  (1.473 m)      Physical Exam  Constitutional: She appears well-developed. She appears ill.  Ill and weak appearing elderly female, appears stated age, alert   HENT:  Head: Normocephalic and atraumatic.  Right Ear: External ear normal.  Left Ear: External ear normal.  Nose: Nose normal.  Mouth/Throat: Uvula is midline and oropharynx is clear and moist. Mucous membranes are not pale, dry and not cyanotic.  Eyes: Pupils are equal, round, and reactive to light. Conjunctivae and EOM are normal. Right eye exhibits no discharge. Left eye exhibits no discharge. No scleral icterus.  Neck: Normal range of motion. No tracheal deviation present.  Cardiovascular: Regular rhythm. Tachycardia present. Exam reveals no gallop and no friction rub.  No murmur  heard. Pulses:      Radial pulses are 2+ on the right side, and 2+ on the left side.       Dorsalis pedis pulses are 2+ on the right side, and 2+ on the left side.  Pulmonary/Chest: Effort normal. No stridor. Tachypnea noted. No respiratory distress. She has decreased breath sounds in the right lower field and the left lower field. She has no wheezes. She has rhonchi in the right lower field and the left lower field. She has no rales.  Frequent wet and wheezy sounding cough  Abdominal: Soft. Bowel sounds are normal. She exhibits no distension and no mass. There is no tenderness. There is no rigidity, no rebound and no guarding.  Musculoskeletal: Normal range of motion.  Neurological: She is alert. She exhibits normal muscle tone. Coordination normal.  Skin: Skin is warm and dry. Capillary refill takes less than 2 seconds. No rash noted. No erythema.  Slightly pale with decreased skin turgor  Psychiatric: She has a normal mood and affect. Her behavior is normal.  Nursing note and vitals reviewed.          Assessment & Plan:   72 y/o female with PMHx of diabetes, hypertension, hyperlipidemia, RA on immunosuppressive medications, presents with nausea vomiting and severe weakness with near syncope x4 days, has not even been able to keep water down.  Also had URI symptoms which progressed to wet tight cough with associated night sweats.    ICD-10-CM   1. Type 2 diabetes mellitus without complication, without long-term current use of insulin ([place]) E11.9 GLUCOSE 09323  2. Tachycardia R00.0   3. Dehydration E86.0   4. Nausea and vomiting, intractability of vomiting not specified,  unspecified vomiting type R11.2   5. Cough R05      CBG 271  Pt too weak to sit up from laying on exam table.   She is tachypneic and tachycardic, very weak and near syncopal with going from laying to sitting up, this also also causes her to almost vomit.  She has frequent coughing that also has nearly caused her to  vomit.  Feel she is too unstable for any attempted treatment from clinic or outpatient, to discuss with her options to get to the ER for evaluation -do not feel she can drive herself she can barely sit up.  She does not know to call to get a ride so she does agree to EMS transport to the ER for further evaluation.     Delsa Grana, PA-C 07/11/18 12:26 PM

## 2018-07-12 ENCOUNTER — Other Ambulatory Visit: Payer: Self-pay

## 2018-07-12 ENCOUNTER — Encounter (HOSPITAL_COMMUNITY): Payer: Self-pay | Admitting: *Deleted

## 2018-07-12 DIAGNOSIS — Z23 Encounter for immunization: Secondary | ICD-10-CM | POA: Diagnosis present

## 2018-07-12 DIAGNOSIS — J3489 Other specified disorders of nose and nasal sinuses: Secondary | ICD-10-CM | POA: Diagnosis present

## 2018-07-12 DIAGNOSIS — B349 Viral infection, unspecified: Secondary | ICD-10-CM | POA: Diagnosis present

## 2018-07-12 DIAGNOSIS — Z96651 Presence of right artificial knee joint: Secondary | ICD-10-CM | POA: Diagnosis present

## 2018-07-12 DIAGNOSIS — I10 Essential (primary) hypertension: Secondary | ICD-10-CM | POA: Diagnosis present

## 2018-07-12 DIAGNOSIS — E669 Obesity, unspecified: Secondary | ICD-10-CM | POA: Diagnosis present

## 2018-07-12 DIAGNOSIS — Z888 Allergy status to other drugs, medicaments and biological substances status: Secondary | ICD-10-CM | POA: Diagnosis not present

## 2018-07-12 DIAGNOSIS — Z7982 Long term (current) use of aspirin: Secondary | ICD-10-CM | POA: Diagnosis not present

## 2018-07-12 DIAGNOSIS — R6510 Systemic inflammatory response syndrome (SIRS) of non-infectious origin without acute organ dysfunction: Secondary | ICD-10-CM | POA: Diagnosis present

## 2018-07-12 DIAGNOSIS — Z79818 Long term (current) use of other agents affecting estrogen receptors and estrogen levels: Secondary | ICD-10-CM | POA: Diagnosis not present

## 2018-07-12 DIAGNOSIS — R112 Nausea with vomiting, unspecified: Secondary | ICD-10-CM | POA: Diagnosis present

## 2018-07-12 DIAGNOSIS — Z6834 Body mass index (BMI) 34.0-34.9, adult: Secondary | ICD-10-CM | POA: Diagnosis not present

## 2018-07-12 DIAGNOSIS — E785 Hyperlipidemia, unspecified: Secondary | ICD-10-CM | POA: Diagnosis present

## 2018-07-12 DIAGNOSIS — Z79899 Other long term (current) drug therapy: Secondary | ICD-10-CM | POA: Diagnosis not present

## 2018-07-12 DIAGNOSIS — E119 Type 2 diabetes mellitus without complications: Secondary | ICD-10-CM | POA: Diagnosis present

## 2018-07-12 DIAGNOSIS — R809 Proteinuria, unspecified: Secondary | ICD-10-CM | POA: Diagnosis present

## 2018-07-12 DIAGNOSIS — Z7984 Long term (current) use of oral hypoglycemic drugs: Secondary | ICD-10-CM | POA: Diagnosis not present

## 2018-07-12 DIAGNOSIS — E876 Hypokalemia: Secondary | ICD-10-CM | POA: Diagnosis present

## 2018-07-12 DIAGNOSIS — I160 Hypertensive urgency: Secondary | ICD-10-CM | POA: Diagnosis present

## 2018-07-12 DIAGNOSIS — D72829 Elevated white blood cell count, unspecified: Secondary | ICD-10-CM | POA: Diagnosis present

## 2018-07-12 DIAGNOSIS — M069 Rheumatoid arthritis, unspecified: Secondary | ICD-10-CM | POA: Diagnosis present

## 2018-07-12 LAB — BASIC METABOLIC PANEL
Anion gap: 11 (ref 5–15)
BUN: 25 mg/dL — ABNORMAL HIGH (ref 8–23)
CO2: 27 mmol/L (ref 22–32)
Calcium: 8.5 mg/dL — ABNORMAL LOW (ref 8.9–10.3)
Chloride: 103 mmol/L (ref 98–111)
Creatinine, Ser: 0.91 mg/dL (ref 0.44–1.00)
GFR calc Af Amer: >60 mL/min (ref >60)
GFR calc non Af Amer: >60 mL/min (ref >60)
Glucose, Bld: 169 mg/dL — ABNORMAL HIGH (ref 70–99)
Potassium: 3.1 mmol/L — ABNORMAL LOW (ref 3.5–5.1)
Sodium: 141 mmol/L (ref 135–145)

## 2018-07-12 LAB — LACTIC ACID, PLASMA: Lactic Acid, Venous: 1.3 mmol/L (ref 0.5–1.9)

## 2018-07-12 LAB — CBC WITH DIFFERENTIAL/PLATELET
Abs Immature Granulocytes: 0.12 10*3/uL — ABNORMAL HIGH (ref 0.00–0.07)
Basophils Absolute: 0 10*3/uL (ref 0.0–0.1)
Basophils Relative: 0 %
Eosinophils Absolute: 0 10*3/uL (ref 0.0–0.5)
Eosinophils Relative: 0 %
HCT: 45.3 % (ref 36.0–46.0)
Hemoglobin: 14.6 g/dL (ref 12.0–15.0)
Immature Granulocytes: 1 %
Lymphocytes Relative: 10 %
Lymphs Abs: 1.5 10*3/uL (ref 0.7–4.0)
MCH: 31.5 pg (ref 26.0–34.0)
MCHC: 32.2 g/dL (ref 30.0–36.0)
MCV: 97.8 fL (ref 80.0–100.0)
Monocytes Absolute: 1.5 10*3/uL — ABNORMAL HIGH (ref 0.1–1.0)
Monocytes Relative: 11 %
Neutro Abs: 10.9 10*3/uL — ABNORMAL HIGH (ref 1.7–7.7)
Neutrophils Relative %: 78 %
Platelets: 208 10*3/uL (ref 150–400)
RBC: 4.63 MIL/uL (ref 3.87–5.11)
RDW: 14.1 % (ref 11.5–15.5)
WBC: 14 10*3/uL — ABNORMAL HIGH (ref 4.0–10.5)
nRBC: 0 % (ref 0.0–0.2)

## 2018-07-12 LAB — CBG MONITORING, ED: GLUCOSE-CAPILLARY: 126 mg/dL — AB (ref 70–99)

## 2018-07-12 LAB — GLUCOSE, CAPILLARY
Glucose-Capillary: 146 mg/dL — ABNORMAL HIGH (ref 70–99)
Glucose-Capillary: 176 mg/dL — ABNORMAL HIGH (ref 70–99)
Glucose-Capillary: 99 mg/dL (ref 70–99)

## 2018-07-12 LAB — PROCALCITONIN: Procalcitonin: 0.24 ng/mL

## 2018-07-12 LAB — MAGNESIUM: MAGNESIUM: 1.3 mg/dL — AB (ref 1.7–2.4)

## 2018-07-12 MED ORDER — POTASSIUM CHLORIDE IN NACL 40-0.9 MEQ/L-% IV SOLN
INTRAVENOUS | Status: DC
  Administered 2018-07-12: 100 mL/h via INTRAVENOUS
  Filled 2018-07-12 (×2): qty 1000

## 2018-07-12 MED ORDER — POTASSIUM CHLORIDE CRYS ER 20 MEQ PO TBCR
40.0000 meq | EXTENDED_RELEASE_TABLET | ORAL | Status: AC
  Administered 2018-07-12 (×2): 40 meq via ORAL
  Filled 2018-07-12 (×2): qty 2

## 2018-07-12 MED ORDER — SODIUM CHLORIDE 0.9 % IV BOLUS
500.0000 mL | Freq: Once | INTRAVENOUS | Status: AC
  Administered 2018-07-12: 500 mL via INTRAVENOUS

## 2018-07-12 MED ORDER — INFLUENZA VAC SPLIT HIGH-DOSE 0.5 ML IM SUSY
0.5000 mL | PREFILLED_SYRINGE | INTRAMUSCULAR | Status: AC
  Administered 2018-07-13: 0.5 mL via INTRAMUSCULAR
  Filled 2018-07-12: qty 0.5

## 2018-07-12 MED ORDER — MAGNESIUM SULFATE 4 GM/100ML IV SOLN
4.0000 g | Freq: Once | INTRAVENOUS | Status: AC
  Administered 2018-07-12: 4 g via INTRAVENOUS
  Filled 2018-07-12: qty 100

## 2018-07-12 MED ORDER — BOOST / RESOURCE BREEZE PO LIQD CUSTOM
1.0000 | Freq: Three times a day (TID) | ORAL | Status: DC
  Administered 2018-07-13: 1 via ORAL

## 2018-07-12 MED ORDER — LACTATED RINGERS IV SOLN
INTRAVENOUS | Status: DC
  Administered 2018-07-12 – 2018-07-13 (×2): via INTRAVENOUS

## 2018-07-12 NOTE — ED Notes (Signed)
Spoke with phlebotomy and they are going to collect 5am blood work

## 2018-07-12 NOTE — ED Notes (Addendum)
Spoke with pharmacy regarding medication not crossing over to the pyxis. Stated she will send the medication to the ED all at once to be given

## 2018-07-12 NOTE — ED Notes (Signed)
Attempt x2 to call report to [facility]. Was put on hold for 9 minutes due to IP RN being unable to take report at this time (patient care). Patient delay to admit to IP floor due to delay in giving report.

## 2018-07-12 NOTE — ED Notes (Signed)
Opyd, MD notified that pt bp went from 197/97 (126) to 106/67 (77).

## 2018-07-12 NOTE — ED Notes (Signed)
ED TO INPATIENT HANDOFF REPORT  Name/Age/Gender [name] 72 y.o. female  Code Status    Code Status Orders  (From admission, onward)         Start     Ordered   07/11/18 1941  Full code  Continuous     07/11/18 1942        Code Status History    Date Active Date Inactive Code Status Order ID Comments User Context   03/16/2014 0907 03/21/2014 1220 Full Code [id number]  Katherine Hazel, MD ED   10/16/2013 2345 10/21/2013 1629 Full Code [id number]  Theodis Blaze, MD ED   05/01/2013 1728 05/05/2013 1540 Full Code [id number]  Earnstine Regal, PA-C ED   08/01/2012 1726 08/05/2012 1619 Full Code [id number]  Catalina Lunger, RN Inpatient      Home/SNF/Other Home  Chief Complaint Weakness  Level of Care/Admitting Diagnosis ED Disposition    ED Disposition Condition [facility] Area: [facility] [[id number]]  Level of Care: Telemetry [5]  Admit to tele based on following criteria: Complex arrhythmia (Bradycardia/Tachycardia)  Diagnosis: SIRS (systemic inflammatory response syndrome) [facility]) [[id number]]  Admitting Physician: Vianne Bulls [[id number]]  Attending Physician: Vianne Bulls [[id number]]  PT Class (Do Not Modify): Observation [104]  PT Acc Code (Do Not Modify): Observation [10022]       Medical History Past Medical History:  Diagnosis Date  . Arthritis    osteoarthritis  . Bursitis of right shoulder   . Chronic cholecystitis with calculus 05/04/2013  . 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 Diabetes type 2, controlled ([place]) 05/04/2013  . Fatty liver disease, nonalcoholic 2778   hospitalized, MRCP dx fatty liver  . GERD (gastroesophageal reflux disease)   . Hormone replacement therapy (postmenopausal)   . Hyperlipidemia   . Hypertension   . Normal cardiac stress test    pt can't remember when or where  . Obesity, unspecified 12/27/2012  . Rheumatoid arthritis(714.0) 09/28/2012  . 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 Unspecified essential hypertension 05/04/2013    Allergies Allergies  Allergen Reactions  . Monopril [Fosinopril] Cough  . Norvasc [Amlodipine Besylate] Other (See Comments)    10 mg dose causes LE edema.  Tolerates 14m dose.    IV Location/Drains/Wounds Patient Lines/Drains/Airways Status   Active Line/Drains/Airways    Name:   Placement date:   Placement time:   Site:   Days:   Peripheral IV 07/12/18 Right Hand   07/12/18    0133    Hand   less than 1          Labs/Imaging Results for orders placed or performed during the hospital encounter of 07/11/18 (from the past 48 hour(s))  Comprehensive metabolic panel     Status: Abnormal   Collection Time: 07/11/18  3:32 PM  Result Value Ref Range   Sodium 142 135 - 145 mmol/L   Potassium 3.0 (L) 3.5 - 5.1 mmol/L   Chloride 98 98 - 111 mmol/L   CO2 29 22 - 32 mmol/L   Glucose, Bld 234 (H) 70 - 99 mg/dL   BUN 21 8 - 23 mg/dL   Creatinine, Ser 1.05 (H) 0.44 - 1.00 mg/dL   Calcium 9.4 8.9 - 10.3 mg/dL   Total Protein 8.6 (H) 6.5 - 8.1 g/dL   Albumin 4.0 3.5 - 5.0 g/dL   AST 35 15 - 41 U/L   ALT 25  0 - 44 U/L   Alkaline Phosphatase 125 38 - 126 U/L   Total Bilirubin 1.3 (H) 0.3 - 1.2 mg/dL   GFR calc non Af Amer 51 (L) >60 mL/min   GFR calc Af Amer 59 (L) >60 mL/min    Comment: (NOTE) The eGFR has been calculated using the CKD EPI equation. This calculation has not been validated in all clinical situations. eGFR's persistently <60 mL/min signify possible Chronic Kidney Disease.    Anion gap 15 5 - 15    Comment: Performed at [facility], [place] [address], [place], [place] [address]  CBC WITH DIFFERENTIAL     Status: Abnormal   Collection Time: 07/11/18  3:32 PM  Result Value Ref Range   WBC 17.6 (H) 4.0 - 10.5 K/uL   RBC 5.15 (H) 3.87 - 5.11 MIL/uL   Hemoglobin 16.2 (H) 12.0 - 15.0 g/dL   HCT 50.2 (H) 36.0 - 46.0 %   MCV 97.5 80.0 - 100.0 fL   MCH 31.5 26.0 - 34.0 pg    MCHC 32.3 30.0 - 36.0 g/dL   RDW 13.9 11.5 - 15.5 %   Platelets 228 150 - 400 K/uL   nRBC 0.0 0.0 - 0.2 %   Neutrophils Relative % 80 %   Neutro Abs 14.2 (H) 1.7 - 7.7 K/uL   Lymphocytes Relative 8 %   Lymphs Abs 1.3 0.7 - 4.0 K/uL   Monocytes Relative 11 %   Monocytes Absolute 1.9 (H) 0.1 - 1.0 K/uL   Eosinophils Relative 0 %   Eosinophils Absolute 0.0 0.0 - 0.5 K/uL   Basophils Relative 0 %   Basophils Absolute 0.0 0.0 - 0.1 K/uL   Immature Granulocytes 1 %   Abs Immature Granulocytes 0.18 (H) 0.00 - 0.07 K/uL    Comment: Performed at [facility], [place] [address], [place], [place] [address]  I-Stat CG4 Lactic Acid, ED     Status: Abnormal   Collection Time: 07/11/18  3:43 PM  Result Value Ref Range   Lactic Acid, Venous 2.25 (HH) 0.5 - 1.9 mmol/L   Comment NOTIFIED PHYSICIAN   Urinalysis, Routine w reflex microscopic     Status: Abnormal   Collection Time: 07/11/18  5:19 PM  Result Value Ref Range   Color, Urine YELLOW YELLOW   APPearance CLEAR CLEAR   Specific Gravity, Urine 1.014 1.005 - 1.030   pH 8.0 5.0 - 8.0   Glucose, UA 150 (A) NEGATIVE mg/dL   Hgb urine dipstick SMALL (A) NEGATIVE   Bilirubin Urine NEGATIVE NEGATIVE   Ketones, ur 20 (A) NEGATIVE mg/dL   Protein, ur >=300 (A) NEGATIVE mg/dL   Nitrite NEGATIVE NEGATIVE   Leukocytes, UA LARGE (A) NEGATIVE   RBC / HPF 6-10 0 - 5 RBC/hpf   WBC, UA 11-20 0 - 5 WBC/hpf   Bacteria, UA RARE (A) NONE SEEN   Squamous Epithelial / LPF 6-10 0 - 5    Comment: Performed at [facility], [place] [address], [place], [place] [address]  Influenza panel by PCR (type A & B)     Status: None   Collection Time: 07/11/18  8:00 PM  Result Value Ref Range   Influenza A By PCR NEGATIVE NEGATIVE   Influenza B By PCR NEGATIVE NEGATIVE    Comment: (NOTE) The Xpert Xpress Flu assay is intended as an aid in the diagnosis of  influenza and should not be used as a sole basis for treatment.  This  assay  is FDA approved for nasopharyngeal swab specimens only. Nasal  washings and aspirates are unacceptable for Xpert Xpress Flu testing. Performed at [facility], [place] [address] [place], [place] [address]   Lactic acid, plasma     Status: None   Collection Time: 07/11/18  8:54 PM  Result Value Ref Range   Lactic Acid, Venous 1.4 0.5 - 1.9 mmol/L    Comment: Performed at [facility], [place] [address], [place], [place] [address]  CBG monitoring, ED     Status: Abnormal   Collection Time: 07/11/18 10:48 PM  Result Value Ref Range   Glucose-Capillary 189 (H) 70 - 99 mg/dL  Lactic acid, plasma     Status: None   Collection Time: 07/11/18 11:57 PM  Result Value Ref Range   Lactic Acid, Venous 1.3 0.5 - 1.9 mmol/L    Comment: Performed at [facility], [place] [address], [place], [place] [address]  Basic metabolic panel     Status: Abnormal   Collection Time: 07/12/18  5:08 AM  Result Value Ref Range   Sodium 141 135 - 145 mmol/L   Potassium 3.1 (L) 3.5 - 5.1 mmol/L   Chloride 103 98 - 111 mmol/L   CO2 27 22 - 32 mmol/L   Glucose, Bld 169 (H) 70 - 99 mg/dL   BUN 25 (H) 8 - 23 mg/dL   Creatinine, Ser 0.91 0.44 - 1.00 mg/dL   Calcium 8.5 (L) 8.9 - 10.3 mg/dL   GFR calc non Af Amer >60 >60 mL/min   GFR calc Af Amer >60 >60 mL/min    Comment: (NOTE) The eGFR has been calculated using the CKD EPI equation. This calculation has not been validated in all clinical situations. eGFR's persistently <60 mL/min signify possible Chronic Kidney Disease.    Anion gap 11 5 - 15    Comment: Performed at [facility], [place] [address] [place], [place] [address]  CBC WITH DIFFERENTIAL     Status: Abnormal   Collection Time: 07/12/18  5:08 AM  Result Value Ref Range   WBC 14.0 (H) 4.0 - 10.5 K/uL   RBC 4.63 3.87 - 5.11 MIL/uL   Hemoglobin 14.6 12.0 - 15.0 g/dL   HCT 45.3 36.0 - 46.0 %   MCV 97.8 80.0 - 100.0 fL   MCH 31.5 26.0 -  34.0 pg   MCHC 32.2 30.0 - 36.0 g/dL   RDW 14.1 11.5 - 15.5 %   Platelets 208 150 - 400 K/uL   nRBC 0.0 0.0 - 0.2 %   Neutrophils Relative % 78 %   Neutro Abs 10.9 (H) 1.7 - 7.7 K/uL   Lymphocytes Relative 10 %   Lymphs Abs 1.5 0.7 - 4.0 K/uL   Monocytes Relative 11 %   Monocytes Absolute 1.5 (H) 0.1 - 1.0 K/uL   Eosinophils Relative 0 %   Eosinophils Absolute 0.0 0.0 - 0.5 K/uL   Basophils Relative 0 %   Basophils Absolute 0.0 0.0 - 0.1 K/uL   Immature Granulocytes 1 %   Abs Immature Granulocytes 0.12 (H) 0.00 - 0.07 K/uL    Comment: Performed at [facility], [place] [address] [place], [place] [address]  Magnesium     Status: Abnormal   Collection Time: 07/12/18  5:08 AM  Result Value Ref Range   Magnesium 1.3 (L) 1.7 - 2.4 mg/dL    Comment: Performed at [facility], [place] [address] [place],  [place] [address]  CBG monitoring, ED     Status: Abnormal   Collection Time: 07/12/18  8:12 AM  Result Value Ref Range   Glucose-Capillary 126 (H) 70 - 99 mg/dL   Dg Chest 2 View  Result Date: 07/11/2018 CLINICAL DATA:  Cough EXAM: CHEST - 2 VIEW COMPARISON:  06/03/2016 chest radiograph. FINDINGS: Stable cardiomediastinal silhouette with normal heart size. No pneumothorax. No pleural effusion. Lungs appear clear, with no acute consolidative airspace disease and no pulmonary edema. IMPRESSION: No active cardiopulmonary disease. Electronically Signed   By: Ilona Sorrel M.D.   On: 07/11/2018 15:51   Ct Chest W Contrast  Result Date: 07/11/2018 CLINICAL DATA:  Cough with generalized weakness, nausea and vomiting x1 week. Periumbilical pain x1 day. Low EGFR 51. EXAM: CT CHEST, ABDOMEN, AND PELVIS WITH CONTRAST TECHNIQUE: Multidetector CT imaging of the chest, abdomen and pelvis was performed following the standard protocol during bolus administration of intravenous contrast. CONTRAST:  55m OMNIPAQUE IOHEXOL 300 MG/ML  SOLN COMPARISON:  None. FINDINGS: CT  CHEST FINDINGS Cardiovascular: Normal size heart with dense left main and three-vessel coronary arteriosclerosis. No pericardial effusion or thickening. Aortic atherosclerosis without dissection or aneurysm. Suboptimal assessment of the pulmonary arteries beyond the proximal lobar level due to contrast volume and timing. No large central pulmonary embolus is identified. Patent great vessels with atherosclerosis. Mediastinum/Nodes: Dominant nodule in the left thyroid gland measuring 16 x 13 mm, series 2/11. No thyromegaly. Patent trachea and mainstem bronchi. No mediastinal nor hilar adenopathy. The esophagus is unremarkable. Lungs/Pleura: No confluent airspace disease. Dependent atelectasis at each base. No effusion or pneumothorax. Musculoskeletal: Small bone islands of the T6 vertebral body. Thoracic spondylosis with multi level mild disc space narrowing and endplate spurring. No aggressive osseous lesions. CT ABDOMEN PELVIS FINDINGS Hepatobiliary: No focal liver abnormality is seen. Status post cholecystectomy. No biliary dilatation. Pancreas: Unremarkable. No pancreatic ductal dilatation or surrounding inflammatory changes. Spleen: Normal in size without focal abnormality. Adrenals/Urinary Tract: Normal bilateral adrenal glands. Simple cortical and exophytic cysts are noted of the right kidney the largest arising off the posterior aspect measuring 8.3 x 6.3 x 5.9 cm. Small parapelvic and cortical cysts are noted of the left kidney. Tiny nonobstructing interpolar calculus is seen of the left kidney. No obstructive uropathy. Symmetric pyelograms are noted both kidneys. Urinary bladder is opacified with contrast and demonstrates no focal mural thickening, diverticulum or intraluminal mass. Stomach/Bowel: Stomach is within normal limits. Appendix appears normal and contrast filled. No evidence of bowel wall thickening, distention, or inflammatory changes. Scattered colonic diverticulosis without acute  diverticulitis. Vascular/Lymphatic: Aortoiliac atherosclerosis without aneurysm or adenopathy. Reproductive: Hysterectomy. No adnexal mass. Other: No free air nor free fluid. Musculoskeletal: Dextroconvex scoliosis of the thoracic spine with lumbar spondylosis. IMPRESSION: 1. 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 2. No large central pulmonary embolus. Suboptimal assessment of the pulmonary arteries beyond the proximal lobar level due to contrast volume and timing. 3. Dominant nodule in the left thyroid gland measuring 16 x 13 mm. This can be correlated with thyroid ultrasound. This follows ACR consensus guidelines: Managing Incidental Thyroid Nodules Detected on Imaging: White Paper of the ACR Incidental Thyroid Findings Committee. J Am Coll Radiol 2015; 12:143-150. 4. Simple and exophytic cysts of the right kidney measuring 8.3 x 6.3 x 5.9 cm. Tiny nonobstructing interpolar calculus of the left kidney. 5. Colonic diverticulosis without acute diverticulitis. 6. Dextroconvex scoliosis of the thoracic spine with lumbar spondylosis. Aortic Atherosclerosis (ICD10-I70.0). Electronically Signed   By: DAshley Royalty  M.D.   On: 07/11/2018 18:23   Ct Abdomen Pelvis W Contrast  Result Date: 07/11/2018 CLINICAL DATA:  Cough with generalized weakness, nausea and vomiting x1 week. Periumbilical pain x1 day. Low EGFR 51. EXAM: CT CHEST, ABDOMEN, AND PELVIS WITH CONTRAST TECHNIQUE: Multidetector CT imaging of the chest, abdomen and pelvis was performed following the standard protocol during bolus administration of intravenous contrast. CONTRAST:  37m OMNIPAQUE IOHEXOL 300 MG/ML  SOLN COMPARISON:  None. FINDINGS: CT CHEST FINDINGS Cardiovascular: Normal size heart with dense left main and three-vessel coronary arteriosclerosis. No pericardial effusion or thickening. Aortic atherosclerosis without dissection or aneurysm. Suboptimal assessment of the pulmonary arteries beyond the proximal lobar level due to  contrast volume and timing. No large central pulmonary embolus is identified. Patent great vessels with atherosclerosis. Mediastinum/Nodes: Dominant nodule in the left thyroid gland measuring 16 x 13 mm, series 2/11. No thyromegaly. Patent trachea and mainstem bronchi. No mediastinal nor hilar adenopathy. The esophagus is unremarkable. Lungs/Pleura: No confluent airspace disease. Dependent atelectasis at each base. No effusion or pneumothorax. Musculoskeletal: Small bone islands of the T6 vertebral body. Thoracic spondylosis with multi level mild disc space narrowing and endplate spurring. No aggressive osseous lesions. CT ABDOMEN PELVIS FINDINGS Hepatobiliary: No focal liver abnormality is seen. Status post cholecystectomy. No biliary dilatation. Pancreas: Unremarkable. No pancreatic ductal dilatation or surrounding inflammatory changes. Spleen: Normal in size without focal abnormality. Adrenals/Urinary Tract: Normal bilateral adrenal glands. Simple cortical and exophytic cysts are noted of the right kidney the largest arising off the posterior aspect measuring 8.3 x 6.3 x 5.9 cm. Small parapelvic and cortical cysts are noted of the left kidney. Tiny nonobstructing interpolar calculus is seen of the left kidney. No obstructive uropathy. Symmetric pyelograms are noted both kidneys. Urinary bladder is opacified with contrast and demonstrates no focal mural thickening, diverticulum or intraluminal mass. Stomach/Bowel: Stomach is within normal limits. Appendix appears normal and contrast filled. No evidence of bowel wall thickening, distention, or inflammatory changes. Scattered colonic diverticulosis without acute diverticulitis. Vascular/Lymphatic: Aortoiliac atherosclerosis without aneurysm or adenopathy. Reproductive: Hysterectomy. No adnexal mass. Other: No free air nor free fluid. Musculoskeletal: Dextroconvex scoliosis of the thoracic spine with lumbar spondylosis. IMPRESSION: 1. Dense left main and three-vessel  coronary arteriosclerosis. 2. No large central pulmonary embolus. Suboptimal assessment of the pulmonary arteries beyond the proximal lobar level due to contrast volume and timing. 3. Dominant nodule in the left thyroid gland measuring 16 x 13 mm. This can be correlated with thyroid ultrasound. This follows ACR consensus guidelines: Managing Incidental Thyroid Nodules Detected on Imaging: White Paper of the ACR Incidental Thyroid Findings Committee. J Am Coll Radiol 2015; 12:143-150. 4. Simple and exophytic cysts of the right kidney measuring 8.3 x 6.3 x 5.9 cm. Tiny nonobstructing interpolar calculus of the left kidney. 5. Colonic diverticulosis without acute diverticulitis. 6. Dextroconvex scoliosis of the thoracic spine with lumbar spondylosis. Aortic Atherosclerosis (ICD10-I70.0). Electronically Signed   By: DAshley RoyaltyM.D.   On: 07/11/2018 18:23   EKG Interpretation  Date/Time:  Tuesday July 11 2018 15:19:20 EST Ventricular Rate:  95 PR Interval:    QRS Duration: 89 QT Interval:  367 QTC Calculation: 462 R Axis:   72 Text Interpretation:  Sinus tachycardia Multiple premature complexes, vent & supraven Short PR interval Low voltage, extremity leads Anteroseptal infarct, old Nonspecific T abnormalities, lateral leads No significant change was found Confirmed by CJola Schmidt([id number] on 07/11/2018 4:23:17 PM   Pending Labs Unresulted Labs (From admission, onward)    Start  Ordered   07/18/18 0500  Creatinine, serum  (enoxaparin (LOVENOX)    CrCl >/= 30 ml/min)  Weekly,   R    Comments:  while on enoxaparin therapy    07/11/18 1942   07/12/18 0500  Creatinine, serum  Daily,   R     07/11/18 1952   07/11/18 1942  Urine culture  Add-on,   R     07/11/18 1942   07/11/18 1443  Blood Culture (routine x 2)  BLOOD CULTURE X 2,   STAT     07/11/18 1457          Vitals/Pain Today's Vitals   07/12/18 1000 07/12/18 1030 07/12/18 1100 07/12/18 1146  BP: (!) 145/85 119/74 104/66 (!)  99/59  Pulse: 91 90 82 66  Resp: _0 Temp:      TempSrc:      SpO2: 96% 96% 99% 96%  PainSc:        Isolation Precautions Droplet precaution  Medications Medications  metroNIDAZOLE (FLAGYL) IVPB 500 mg (0 mg Intravenous Stopped 07/12/18 1058)  sodium chloride (PF) 0.9 % injection (has no administration in time range)  aspirin EC tablet 81 mg (81 mg Oral Given 07/12/18 0948)  leflunomide (ARAVA) tablet 10 mg (10 mg Oral Given 07/12/18 1018)  amLODipine (NORVASC) tablet 5 mg (5 mg Oral Given 07/12/18 0949)  atorvastatin (LIPITOR) tablet 10 mg (has no administration in time range)  cloNIDine (CATAPRES) tablet 0.3 mg (0.3 mg Oral Given 07/12/18 0947)  losartan (COZAAR) tablet 12.5 mg (12.5 mg Oral Given 07/12/18 1018)  magnesium oxide (MAG-OX) tablet 800 mg (800 mg Oral Given 07/12/18 0949)  pantoprazole (PROTONIX) EC tablet 40 mg (40 mg Oral Given 50/35/46 5681)  folic acid (FOLVITE) tablet 1 mg (1 mg Oral Given 07/12/18 0949)  vitamin B-12 (CYANOCOBALAMIN) tablet 500 mcg (500 mcg Oral Given 07/12/18 1018)  multivitamin with minerals tablet 1 tablet (1 tablet Oral Given 07/12/18 0949)  diclofenac sodium (VOLTAREN) 1 % transdermal gel 4 g (has no administration in time range)  enoxaparin (LOVENOX) injection 40 mg (40 mg Subcutaneous Given 07/11/18 2343)  sodium chloride flush (NS) 0.9 % injection 3 mL (3 mLs Intravenous Given 07/12/18 1021)  0.9 % NaCl with KCl 40 mEq / L  infusion ( Intravenous Rate/Dose Verify 07/12/18 0846)  acetaminophen (TYLENOL) tablet 650 mg (has no administration in time range)    Or  acetaminophen (TYLENOL) suppository 650 mg (has no administration in time range)  HYDROcodone-acetaminophen (NORCO/VICODIN) 5-325 MG per tablet 1-2 tablet (1 tablet Oral Given 07/12/18 0949)  ondansetron (ZOFRAN) tablet 4 mg (4 mg Oral Given 07/12/18 0949)    Or  ondansetron (ZOFRAN) injection 4 mg ( Intravenous See Alternative 07/12/18 0949)  insulin aspart (novoLOG)  injection 0-9 Units (1 Units Subcutaneous Given 07/12/18 0947)  insulin aspart (novoLOG) injection 0-5 Units (0 Units Subcutaneous Not Given 07/11/18 2332)  ceFEPIme (MAXIPIME) 1 g in sodium chloride 0.9 % 100 mL IVPB (1 g Intravenous New Bag/Given 07/12/18 1100)  vancomycin (VANCOCIN) IVPB 750 mg/150 ml premix (has no administration in time range)  metoprolol tartrate (LOPRESSOR) tablet 12.5 mg (12.5 mg Oral Given 07/12/18 1020)  promethazine (PHENERGAN) injection 12.5 mg (12.5 mg Intravenous Given 07/12/18 1022)  potassium chloride SA (K-DUR,KLOR-CON) CR tablet 40 mEq (40 mEq Oral Given 07/12/18 0947)  lactated ringers bolus 1,000 mL (0 mLs Intravenous Stopped 07/11/18 1605)    And  lactated ringers bolus 1,000 mL (0 mLs Intravenous Stopped 07/11/18 1605)  And  lactated ringers bolus 250 mL (0 mLs Intravenous Stopped 07/11/18 1635)  ceFEPIme (MAXIPIME) 2 g in sodium chloride 0.9 % 100 mL IVPB (0 g Intravenous Stopped 07/11/18 1605)  vancomycin (VANCOCIN) 1,500 mg in sodium chloride 0.9 % 500 mL IVPB (0 mg Intravenous Stopped 07/11/18 1818)  hydrALAZINE (APRESOLINE) injection 10 mg (10 mg Intravenous Given 07/11/18 1645)  ondansetron (ZOFRAN) injection 4 mg (4 mg Intravenous Given 07/11/18 1707)  iohexol (OMNIPAQUE) 300 MG/ML solution 75 mL (75 mLs Intravenous Contrast Given 07/11/18 1724)  promethazine (PHENERGAN) injection 12.5 mg (12.5 mg Intravenous Given 07/11/18 2121)  potassium chloride SA (K-DUR,KLOR-CON) CR tablet 20 mEq (20 mEq Oral Given 07/11/18 2341)  sodium chloride 0.9 % bolus 500 mL (0 mLs Intravenous Stopped 07/12/18 0636)    Mobility walks with person assist

## 2018-07-12 NOTE — ED Notes (Signed)
Unable to obtain second lactic. IV team consulted

## 2018-07-12 NOTE — ED Notes (Signed)
Called to give report at assigned time, but was asked to call back by RN Gregary Signs due to being in another patient's room.

## 2018-07-12 NOTE — Progress Notes (Signed)
PROGRESS NOTE    Ludene Stokke  VEL:[id number] DOB: [birth year] DOA: 07/11/2018 PCP: Orlena Sheldon, PA-C    Brief Narrative:  [name] is [name] 72 y.o. female with medical history significant for rheumatoid arthritis, type 2 diabetes mellitus, hypertension, now presenting to the emergency department for evaluation of generalized weakness, nausea with nonbloody vomiting, rhinorrhea, and cough.  Patient reports that symptoms developed several days ago with rhinorrhea, cough, and nausea.  She then developed [name] cough, often productive of thick clear and yellow sputum, as well as nonbloody vomiting.  Over the ensuing days, she has developed progressive generalized weakness which is her main complaint.  She also complains of severe nausea with recurrent vomiting.  She denies abdominal pain and denies fevers.  She was evaluated for these complaints today at her outpatient clinic and was directed to the ED for further evaluation.  She has not received Jahniya Duzan seasonal influenza vaccine this year.   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 SIRS  - Presents with several days of N/V, rhinorrhea, and cough  - Found to be tachycardic in 130's with WBC 17,600 and lactate 2.23  - No urinary symptoms, meningismus, or wounds, and CT chest/abd/pelvis with no acute findings - follow blood cultures, urine cx pending collection (UA with LE, negative nitrite, >300 mg/dl protein) - Narrow abx, will stop vanc and flagyl, continue cefepime for now - Most likely Lorie Melichar viral illness (negative influenza PCR)  2. Nausea, vomiting  Poor PO intake - Abd exam benign and no acute  findings on CT  - Suspected secondary to viral illness  - she continues to have poor PO intake and has only tolerated Azahel Belcastro little jello and broth today, continue IVF    3. Hypertension with hypertensive urgency  - BP as high as 210/120 in ED, treated with hydralazine IVP - suspect related to N/V - continue Lopressor, losartan, clonidine, and Norvasc; currently holding HCTZ  4. Type II DM  - A1c was 7.4% in August  - Managed at home with metformin, held on admission  - Check CBG's and use Beatryce Colombo low-intensity SSI with Novolog while in hospital    5. Hypokalemia  Hypomagnesemia - replete and follow  6. Rheumatoid arthritis  - Continue current management with Arava, Enbrel    7. Thyroid nodule  - Noted incidentally on CT  - Outpatient follow-up recommended   # Leukocytosis: improving, follow # Proteinuria: UP/C, follow urine cx  DVT prophylaxis: lovenox Code Status: full  Family Communication: none at bedside Disposition Plan: inpatient given SIRS with poor PO intake with need for continued IVF and antibiotics while following culture results.  Hopefully d/c within next 24-48 hours.   Consultants:   none  Procedures:   none  Antimicrobials:  Anti-infectives (From admission, onward)   Start     Dose/Rate Route Frequency Ordered Stop   07/13/18 1000  vancomycin (VANCOCIN) IVPB 750 mg/150 ml premix  Status:  Discontinued     750 mg 150 mL/hr over 60 Minutes Intravenous Every 36 hours 07/11/18 1952 07/12/18 2038   07/11/18 2200  ceFEPIme (MAXIPIME) 1 g in sodium chloride 0.9 % 100 mL  IVPB     1 g 200 mL/hr over 30 Minutes Intravenous Every 12 hours 07/11/18 1952     07/11/18 1515  vancomycin (VANCOCIN) 1,500 mg in sodium chloride 0.9 % 500 mL IVPB     1,500 mg 250 mL/hr over 120 Minutes Intravenous  Once 07/11/18 1504 07/11/18 1818   07/11/18 1500  ceFEPIme (MAXIPIME) 2 g in sodium chloride 0.9 % 100 mL IVPB     2 g 200 mL/hr over 30 Minutes Intravenous  Once 07/11/18 1457  07/11/18 1605   07/11/18 1500  metroNIDAZOLE (FLAGYL) IVPB 500 mg  Status:  Discontinued     500 mg 100 mL/hr over 60 Minutes Intravenous Every 8 hours 07/11/18 1457 07/12/18 2038   07/11/18 1500  vancomycin (VANCOCIN) IVPB 1000 mg/200 mL premix  Status:  Discontinued     1,000 mg 200 mL/hr over 60 Minutes Intravenous  Once 07/11/18 1457 07/11/18 1504      Subjective: Denies myalgias.  Notes she couldn't keep anything down over the last week.  No fevers, chills, CP.  She notes cough, productive.  Able to tolerate some jellow and broth today, but that's it.  Objective: Vitals:   07/12/18 1146 07/12/18 1200 07/12/18 1250 07/12/18 1304  BP: (!) 99/59 (!) 96/52 112/63   Pulse: 66 68 74   Resp: _0 Temp:   98.3 F (36.8 C)   TempSrc:   Oral   SpO2: 96% 94% 94%   Weight:    74.8 kg  Height:    _1  (1.499 m)    Intake/Output Summary (Last 24 hours) at 07/12/2018 2034 Last data filed at 07/12/2018 1208 Gross per 24 hour  Intake 832.71 ml  Output -  Net 832.71 ml   Filed Weights   07/12/18 1304  Weight: 74.8 kg    Examination:  General exam: Appears calm and comfortable  Respiratory system: Clear to auscultation. Respiratory effort normal. Cardiovascular system: S1 & S2 heard, RRR.  Gastrointestinal system: Abdomen is nondistended, soft and nontender Central nervous system: Alert and oriented. No focal neurological deficits. Extremities: Symmetric 5 x 5 power. Skin: No rashes, lesions or ulcers Psychiatry: Judgement and insight appear normal. Mood & affect appropriate.     Data Reviewed: I have personally reviewed following labs and imaging studies  CBC: Recent Labs  Lab 07/11/18 1532 07/12/18 0508  WBC 17.6* 14.0*  NEUTROABS 14.2* 10.9*  HGB 16.2* 14.6  HCT 50.2* 45.3  MCV 97.5 97.8  PLT 228 469   Basic Metabolic Panel: Recent Labs  Lab 07/11/18 1532 07/12/18 0508  NA 142 141  K 3.0* 3.1*  CL 98 103  CO2 29 27  GLUCOSE 234* 169*  BUN 21  25*  CREATININE 1.05* 0.91  CALCIUM 9.4 8.5*  MG  --  1.3*   GFR: Estimated Creatinine Clearance: 47.8 mL/min (by C-G formula based on SCr of 0.91 mg/dL). Liver Function Tests: Recent Labs  Lab 07/11/18 1532  AST 35  ALT 25  ALKPHOS 125  BILITOT 1.3*  PROT 8.6*  ALBUMIN 4.0   No results for input(s): LIPASE, AMYLASE in the last 168 hours. No results for input(s): AMMONIA in the last 168 hours. Coagulation Profile: No results for input(s): INR, PROTIME in the last 168 hours. Cardiac Enzymes: No results for input(s): CKTOTAL, CKMB, CKMBINDEX, TROPONINI in the last 168 hours. BNP (last 3 results) No results for input(s): PROBNP in the last 8760 hours. HbA1C: No results for input(s): HGBA1C in the last  72 hours. CBG: Recent Labs  Lab 07/11/18 2248 07/12/18 0812 07/12/18 1317 07/12/18 1922  GLUCAP 189* 126* 146* 176*   Lipid Profile: No results for input(s): CHOL, HDL, LDLCALC, TRIG, CHOLHDL, LDLDIRECT in the last 72 hours. Thyroid Function Tests: No results for input(s): TSH, T4TOTAL, FREET4, T3FREE, THYROIDAB in the last 72 hours. Anemia Panel: No results for input(s): VITAMINB12, FOLATE, FERRITIN, TIBC, IRON, RETICCTPCT in the last 72 hours. Sepsis Labs: Recent Labs  Lab 07/11/18 1543 07/11/18 2054 07/11/18 2357 07/12/18 1539  PROCALCITON  --   --   --  0.24  LATICACIDVEN 2.25* 1.4 1.3  --     Recent Results (from the past 240 hour(s))  Blood Culture (routine x 2)     Status: None (Preliminary result)   Collection Time: 07/11/18  3:32 PM  Result Value Ref Range Status   Specimen Description   Final    BLOOD LEFT ANTECUBITAL Performed at [facility], [place] [address] [place], [place] [address]    Special Requests   Final    BOTTLES DRAWN AEROBIC AND ANAEROBIC Blood Culture adequate volume Performed at [facility] [address], [place], [place] [address]    Culture   Final    NO GROWTH < 24 HOURS Performed at  [facility] [address], [place], [place] [address]    Report Status PENDING  Incomplete  Blood Culture (routine x 2)     Status: None (Preliminary result)   Collection Time: 07/11/18  4:00 PM  Result Value Ref Range Status   Specimen Description   Final    BLOOD RIGHT ANTECUBITAL Performed at [facility] [address] [place], [place] [address]    Special Requests   Final    BOTTLES DRAWN AEROBIC AND ANAEROBIC Blood Culture adequate volume Performed at [facility] [address], [place], [place] [address]    Culture   Final    NO GROWTH < 24 HOURS Performed at [facility] [address], [place], [place] [address]    Report Status PENDING  Incomplete         Radiology Studies: Dg Chest 2 View  Result Date: 07/11/2018 CLINICAL DATA:  Cough EXAM: CHEST - 2 VIEW COMPARISON:  06/03/2016 chest radiograph. FINDINGS: Stable cardiomediastinal silhouette with normal heart size. No pneumothorax. No pleural effusion. Lungs appear clear, with no acute consolidative airspace disease and no pulmonary edema. IMPRESSION: No active cardiopulmonary disease. Electronically Signed   By: Ilona Sorrel M.D.   On: 07/11/2018 15:51   Ct Chest W Contrast  Result Date: 07/11/2018 CLINICAL DATA:  Cough with generalized weakness, nausea and vomiting x1 week. Periumbilical pain x1 day. Low EGFR 51. EXAM: CT CHEST, ABDOMEN, AND PELVIS WITH CONTRAST TECHNIQUE: Multidetector CT imaging of the chest, abdomen and pelvis was performed following the standard protocol during bolus administration of intravenous contrast. CONTRAST:  89m OMNIPAQUE IOHEXOL 300 MG/ML  SOLN COMPARISON:  None. FINDINGS: CT CHEST FINDINGS Cardiovascular: Normal size heart with dense left main and three-vessel coronary arteriosclerosis. No pericardial effusion or thickening. Aortic atherosclerosis without dissection or aneurysm. Suboptimal assessment of the pulmonary arteries  beyond the proximal lobar level due to contrast volume and timing. No large central pulmonary embolus is identified. Patent great vessels with atherosclerosis. Mediastinum/Nodes: Dominant nodule in the left thyroid gland measuring 16 x 13 mm, series 2/11. No thyromegaly. Patent trachea and mainstem bronchi. No mediastinal nor hilar adenopathy. The esophagus is  unremarkable. Lungs/Pleura: No confluent airspace disease. Dependent atelectasis at each base. No effusion or pneumothorax. Musculoskeletal: Small bone islands of the T6 vertebral body. Thoracic spondylosis with multi level mild disc space narrowing and endplate spurring. No aggressive osseous lesions. CT ABDOMEN PELVIS FINDINGS Hepatobiliary: No focal liver abnormality is seen. Status post cholecystectomy. No biliary dilatation. Pancreas: Unremarkable. No pancreatic ductal dilatation or surrounding inflammatory changes. Spleen: Normal in size without focal abnormality. Adrenals/Urinary Tract: Normal bilateral adrenal glands. Simple cortical and exophytic cysts are noted of the right kidney the largest arising off the posterior aspect measuring 8.3 x 6.3 x 5.9 cm. Small parapelvic and cortical cysts are noted of the left kidney. Tiny nonobstructing interpolar calculus is seen of the left kidney. No obstructive uropathy. Symmetric pyelograms are noted both kidneys. Urinary bladder is opacified with contrast and demonstrates no focal mural thickening, diverticulum or intraluminal mass. Stomach/Bowel: Stomach is within normal limits. Appendix appears normal and contrast filled. No evidence of bowel wall thickening, distention, or inflammatory changes. Scattered colonic diverticulosis without acute diverticulitis. Vascular/Lymphatic: Aortoiliac atherosclerosis without aneurysm or adenopathy. Reproductive: Hysterectomy. No adnexal mass. Other: No free air nor free fluid. Musculoskeletal: Dextroconvex scoliosis of the thoracic spine with lumbar spondylosis.  IMPRESSION: 1. 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 2. No large central pulmonary embolus. Suboptimal assessment of the pulmonary arteries beyond the proximal lobar level due to contrast volume and timing. 3. Dominant nodule in the left thyroid gland measuring 16 x 13 mm. This can be correlated with thyroid ultrasound. This follows ACR consensus guidelines: Managing Incidental Thyroid Nodules Detected on Imaging: White Paper of the ACR Incidental Thyroid Findings Committee. J Am Coll Radiol 2015; 12:143-150. 4. Simple and exophytic cysts of the right kidney measuring 8.3 x 6.3 x 5.9 cm. Tiny nonobstructing interpolar calculus of the left kidney. 5. Colonic diverticulosis without acute diverticulitis. 6. Dextroconvex scoliosis of the thoracic spine with lumbar spondylosis. Aortic Atherosclerosis (ICD10-I70.0). Electronically Signed   By: Ashley Royalty M.D.   On: 07/11/2018 18:23   Ct Abdomen Pelvis W Contrast  Result Date: 07/11/2018 CLINICAL DATA:  Cough with generalized weakness, nausea and vomiting x1 week. Periumbilical pain x1 day. Low EGFR 51. EXAM: CT CHEST, ABDOMEN, AND PELVIS WITH CONTRAST TECHNIQUE: Multidetector CT imaging of the chest, abdomen and pelvis was performed following the standard protocol during bolus administration of intravenous contrast. CONTRAST:  63m OMNIPAQUE IOHEXOL 300 MG/ML  SOLN COMPARISON:  None. FINDINGS: CT CHEST FINDINGS Cardiovascular: Normal size heart with dense left main and three-vessel coronary arteriosclerosis. No pericardial effusion or thickening. Aortic atherosclerosis without dissection or aneurysm. Suboptimal assessment of the pulmonary arteries beyond the proximal lobar level due to contrast volume and timing. No large central pulmonary embolus is identified. Patent great vessels with atherosclerosis. Mediastinum/Nodes: Dominant nodule in the left thyroid gland measuring 16 x 13 mm, series 2/11. No thyromegaly. Patent trachea and mainstem  bronchi. No mediastinal nor hilar adenopathy. The esophagus is unremarkable. Lungs/Pleura: No confluent airspace disease. Dependent atelectasis at each base. No effusion or pneumothorax. Musculoskeletal: Small bone islands of the T6 vertebral body. Thoracic spondylosis with multi level mild disc space narrowing and endplate spurring. No aggressive osseous lesions. CT ABDOMEN PELVIS FINDINGS Hepatobiliary: No focal liver abnormality is seen. Status post cholecystectomy. No biliary dilatation. Pancreas: Unremarkable. No pancreatic ductal dilatation or surrounding inflammatory changes. Spleen: Normal in size without focal abnormality. Adrenals/Urinary Tract: Normal bilateral adrenal glands. Simple cortical and exophytic cysts are noted of the right kidney the largest arising off the  posterior aspect measuring 8.3 x 6.3 x 5.9 cm. Small parapelvic and cortical cysts are noted of the left kidney. Tiny nonobstructing interpolar calculus is seen of the left kidney. No obstructive uropathy. Symmetric pyelograms are noted both kidneys. Urinary bladder is opacified with contrast and demonstrates no focal mural thickening, diverticulum or intraluminal mass. Stomach/Bowel: Stomach is within normal limits. Appendix appears normal and contrast filled. No evidence of bowel wall thickening, distention, or inflammatory changes. Scattered colonic diverticulosis without acute diverticulitis. Vascular/Lymphatic: Aortoiliac atherosclerosis without aneurysm or adenopathy. Reproductive: Hysterectomy. No adnexal mass. Other: No free air nor free fluid. Musculoskeletal: Dextroconvex scoliosis of the thoracic spine with lumbar spondylosis. IMPRESSION: 1. 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 2. No large central pulmonary embolus. Suboptimal assessment of the pulmonary arteries beyond the proximal lobar level due to contrast volume and timing. 3. Dominant nodule in the left thyroid gland measuring 16 x 13 mm. This can be  correlated with thyroid ultrasound. This follows ACR consensus guidelines: Managing Incidental Thyroid Nodules Detected on Imaging: White Paper of the ACR Incidental Thyroid Findings Committee. J Am Coll Radiol 2015; 12:143-150. 4. Simple and exophytic cysts of the right kidney measuring 8.3 x 6.3 x 5.9 cm. Tiny nonobstructing interpolar calculus of the left kidney. 5. Colonic diverticulosis without acute diverticulitis. 6. Dextroconvex scoliosis of the thoracic spine with lumbar spondylosis. Aortic Atherosclerosis (ICD10-I70.0). Electronically Signed   By: Ashley Royalty M.D.   On: 07/11/2018 18:23        Scheduled Meds: . amLODipine  5 mg Oral Daily  . aspirin EC  81 mg Oral Daily  . atorvastatin  10 mg Oral q1800  . cloNIDine  0.3 mg Oral BID  . enoxaparin (LOVENOX) injection  40 mg Subcutaneous QHS  . feeding supplement  1 Container Oral TID BM  . folic acid  1 mg Oral BID  . insulin aspart  0-5 Units Subcutaneous QHS  . insulin aspart  0-9 Units Subcutaneous TID WC  . leflunomide  10 mg Oral Daily  . losartan  12.5 mg Oral Daily  . magnesium oxide  800 mg Oral BID  . metoprolol tartrate  12.5 mg Oral BID  . multivitamin with minerals  1 tablet Oral Daily  . pantoprazole  40 mg Oral Daily  . sodium chloride flush  3 mL Intravenous Q12H  . vitamin B-12  500 mcg Oral Daily   Continuous Infusions: . 0.9 % NaCl with KCl 40 mEq / L 100 mL/hr (07/12/18 1627)  . ceFEPime (MAXIPIME) IV Stopped (07/12/18 1209)  . magnesium sulfate 1 - 4 g bolus IVPB 4 g (07/12/18 1915)  . metronidazole 500 mg (07/12/18 1633)  . [START ON 07/13/2018] vancomycin       LOS: 0 days    Time spent: over 30 min    Fayrene Helper, MD [facility] Pager [phone]  If 7PM-7AM, please contact night-coverage [email or web address] Password [facility] 07/12/2018, 8:34 PM

## 2018-07-13 LAB — GLUCOSE, CAPILLARY
GLUCOSE-CAPILLARY: 105 mg/dL — AB (ref 70–99)
GLUCOSE-CAPILLARY: 168 mg/dL — AB (ref 70–99)
GLUCOSE-CAPILLARY: 139 mg/dL — AB (ref 70–99)

## 2018-07-13 LAB — CBC
HCT: 39.3 % (ref 36.0–46.0)
Hemoglobin: 12.3 g/dL (ref 12.0–15.0)
MCH: 31.3 pg (ref 26.0–34.0)
MCHC: 31.3 g/dL (ref 30.0–36.0)
MCV: 100 fL (ref 80.0–100.0)
PLATELETS: 168 10*3/uL (ref 150–400)
RBC: 3.93 MIL/uL (ref 3.87–5.11)
RDW: 14.3 % (ref 11.5–15.5)
WBC: 6.7 10*3/uL (ref 4.0–10.5)
nRBC: 0 % (ref 0.0–0.2)

## 2018-07-13 LAB — MAGNESIUM: Magnesium: 2.6 mg/dL — ABNORMAL HIGH (ref 1.7–2.4)

## 2018-07-13 LAB — COMPREHENSIVE METABOLIC PANEL
ALT: 16 U/L (ref 0–44)
AST: 24 U/L (ref 15–41)
Albumin: 2.4 g/dL — ABNORMAL LOW (ref 3.5–5.0)
Alkaline Phosphatase: 76 U/L (ref 38–126)
Anion gap: 5 (ref 5–15)
BUN: 29 mg/dL — ABNORMAL HIGH (ref 8–23)
CHLORIDE: 111 mmol/L (ref 98–111)
CO2: 23 mmol/L (ref 22–32)
Calcium: 7.8 mg/dL — ABNORMAL LOW (ref 8.9–10.3)
Creatinine, Ser: 0.87 mg/dL (ref 0.44–1.00)
Glucose, Bld: 106 mg/dL — ABNORMAL HIGH (ref 70–99)
POTASSIUM: 5.1 mmol/L (ref 3.5–5.1)
SODIUM: 139 mmol/L (ref 135–145)
Total Bilirubin: 0.6 mg/dL (ref 0.3–1.2)
Total Protein: 5.3 g/dL — ABNORMAL LOW (ref 6.5–8.1)

## 2018-07-13 LAB — PROTEIN / CREATININE RATIO, URINE
CREATININE, URINE: 79.64 mg/dL
PROTEIN CREATININE RATIO: 0.2 mg/mg{creat} — AB (ref 0.00–0.15)
TOTAL PROTEIN, URINE: 16 mg/dL

## 2018-07-13 LAB — PROCALCITONIN: PROCALCITONIN: 0.14 ng/mL

## 2018-07-13 MED ORDER — BENZONATATE 100 MG PO CAPS: 100.0000 mg | ORAL_CAPSULE | Freq: Three times a day (TID) | ORAL | Status: DC | PRN

## 2018-07-13 MED ORDER — BENZONATATE 100 MG PO CAPS: 100.0000 mg | ORAL_CAPSULE | Freq: Two times a day (BID) | ORAL | 0 refills | Status: DC | PRN

## 2018-07-13 NOTE — Discharge Summary (Signed)
Physician Discharge Summary  [name] YIR:[id number] DOB: 1946-05-31 DOA: 07/11/2018  PCP: Orlena Sheldon, PA-C  Admit date: 07/11/2018 Discharge date: 07/13/2018  Time spent: 35 minutes  Recommendations for Outpatient Follow-up:  1. Follow up outpatient CBC/CMP 2. Follow up thyroid nodule as outpatient 3. Follow up proteinuria as outpatient (urinalysis here with >300 mg/dl protein, urine culture pending, UP/C 0.2, would follow up cx and repeat UA outpatient) 4. Pt with simple and exophytic cysts of R kidney, follow up outpatient 5. Follow final culture results 6. Follow up with PCP regarding HRT and risks/benefits of continuing this especially in setting of coronary artery calcifications   Discharge Diagnoses:  Principal Problem:   SIRS (systemic inflammatory response syndrome) (HCC) Active Problems:   Essential hypertension, benign   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 without organ or systems involvement (HCC)   Sepsis ([place])   Thyroid nodule   Diabetes mellitus type 2, uncomplicated (HCC)   Hypokalemia   Nausea & vomiting   Hypertensive urgency   Discharge Condition: stable  Diet recommendation: heart healthy  Filed Weights   07/12/18 1304 07/13/18 5009  Weight: 74.8 kg 76.5 kg    History of present illness:  [name] a 72 y.o.femalewith medical history significant forrheumatoid arthritis, type 2 diabetes mellitus, hypertension, now presenting to the emergency department for evaluation of generalized weakness, nausea with nonbloody vomiting, rhinorrhea, and cough. Patient reports that symptoms developed several days ago with rhinorrhea, cough, and nausea. She then developed a cough, often productive of thick clear and yellow sputum, as well as nonbloody vomiting. Over the ensuing days, she has developed progressive generalized weakness which is her main complaint. She also complains of severe nausea with recurrent vomiting. She  denies abdominal pain and denies fevers. She was evaluated for these complaints today at her outpatient clinic and was directed to the ED for further evaluation. She has not received a seasonal influenza vaccine this year.  She was admitted for nausea/vomiting, cough, rhinorrhea.  She had an unremarkable workup which was negative for influenza, CT chest/abd/pelvis without significant findings.  She was initially on broad spectrum abx and these were discontinued on the day of discharge.  On the day of discharge, she was feeling better, tolerating PO.  Suspect her symptoms may have been 2/2 viral illness (she notes her granddaughter recently had a cold).  Hospital Course:  1.SIRS -Presents with several days of N/V, rhinorrhea, and cough -Found to be tachycardic in 130's with WBC 17,600 and lactate 2.23 -No urinary symptoms, meningismus, or wounds, and CT chest/abd/pelvis with no acute findings - follow blood cultures, urine cx pending (UA with LE, negative nitrite, >300 mg/dl protein) -Most likely a viral illness (negative influenza PCR) - antibiotics discontinued  2.Nausea, vomiting Poor PO intake -Abd exam benign and no acute findings on CT -Suspected secondary to viral illness - PO intake improved today   3.Hypertension with hypertensive urgency -improved at discharge, resume home meds  4.Type II DM -A1c was 7.4% in August - resume home meds  5.Hypokalemia Hypomagnesemia - improved  6.Rheumatoid arthritis -Continue current management with Arava, Enbrel  7.Thyroid nodule -Noted incidentally on CT -Outpatient follow-up recommended  # Leukocytosis: improving, follow  # Proteinuria: UP/C (0.2), follow urine cx as outpatient, follow as outpatient  Procedures: none  Consultations:  none  Discharge Exam: Vitals:   07/13/18 0435 07/13/18 1000  BP: 123/63   Pulse: 63   Resp: 20   Temp: 97.8 F (36.6 C)   SpO2:  96% 94%    Feeling better today Tolerating PO better.  Feels ok to go. Daughter notable recently had a cold.  General: No acute distress. Cardiovascular: Heart sounds show a regular rate, and rhythm Lungs: Clear to auscultation bilaterally  Abdomen: Soft, nontender, nondistended Neurological: Alert and oriented 3. Moves all extremities 4. Cranial nerves II through XII grossly intact. Skin: Warm and dry. No rashes or lesions. Extremities: No clubbing or cyanosis. No edema. Psychiatric: Mood and affect are normal. Insight and judgment are appropriate.  Discharge Instructions   Discharge Instructions    Call MD for:  persistant dizziness or light-headedness   Complete by:  As directed    Call MD for:  persistant nausea and vomiting   Complete by:  As directed    Call MD for:  redness, tenderness, or signs of infection (pain, swelling, redness, odor or green/yellow discharge around incision site)   Complete by:  As directed    Call MD for:  severe uncontrolled pain   Complete by:  As directed    Call MD for:  temperature >100.4   Complete by:  As directed    Diet - low sodium heart healthy   Complete by:  As directed    Discharge instructions   Complete by:  As directed    You were seen for a likely viral illness.  You've improved with IV fluids and antibiotics, but since we think this was viral, we will stop antibiotics at this time.  Please schedule follow up with your PCP within a few days.  You should follow up with a repeat urine study because of the protein in your urine.  You should follow up regarding the thyroid nodule with your PCP, you'll need an ultrasound as an outpatient.  You had evidence of coronary artery disease on imaging.  Please discuss this with your PCP.  The plan for your hormone replacement therapy should also be considered long term as well given the risks with this.  Return for new, recurrent, or worsening symptoms.  Please ask your PCP to request  records from this hospitalization so they know what was done and what the next steps will be.   Increase activity slowly   Complete by:  As directed      Allergies as of 07/13/2018      Reactions   Monopril [fosinopril] Cough   Norvasc [amlodipine Besylate] Other (See Comments)   10 mg dose causes LE edema.  Tolerates 52m dose.      Medication List    TAKE these medications   amLODipine 5 MG tablet Commonly known as:  NORVASC TAKE 1 TABLET BY MOUTH EVERY DAY   aspirin EC 81 MG tablet Take 81 mg by mouth daily.   atorvastatin 10 MG tablet Commonly known as:  LIPITOR TAKE 1 TABLET DAILY   benzonatate 100 MG capsule Commonly known as:  TESSALON Take 1 capsule (100 mg total) by mouth 2 (two) times daily as needed for cough.   CALCIUM 600 + D PO Take 1 tablet by mouth 2 (two) times daily.   cloNIDine 0.3 MG tablet Commonly known as:  CATAPRES TAKE 1 TABLET (0.3 MG TOTAL) BY MOUTH 2 (TWO) TIMES DAILY.   ENBREL SURECLICK 50 MG/ML injection Generic drug:  etanercept INJECT 50 MG (0.98 ML) UNDER THE SKIN ONCE A WEEK What changed:  See the new instructions.   estradiol 0.5 MG tablet Commonly known as:  ESTRACE Take 0.5 mg by mouth 2 (two) times daily.  fish oil-omega-3 fatty acids 1000 MG capsule Take 1 g by mouth at bedtime.   folic acid 1 MG tablet Commonly known as:  FOLVITE TAKE 1 TABLET BY MOUTH TWICE A DAY   Iron 325 (65 Fe) MG Tabs Take 1 tablet by mouth daily.   leflunomide 10 MG tablet Commonly known as:  ARAVA TAKE 1 TABLET BY MOUTH EVERY DAY   losartan-hydrochlorothiazide 100-12.5 MG tablet Commonly known as:  HYZAAR Take 1 tablet by mouth daily.   magnesium oxide 400 MG tablet Commonly known as:  MAG-OX Take 2 tablets 431m (total 802m in the morning, and 40062mn the evening What changed:    how much to take  how to take this  when to take this  additional instructions   metFORMIN 500 MG tablet Commonly known as:  GLUCOPHAGE Take  500 mg by mouth 2 (two) times daily with a meal.   metoprolol tartrate 25 MG tablet Commonly known as:  LOPRESSOR TAKE 1/2 TABLET TWICE A DAY What changed:  additional instructions   multivitamins ther. w/minerals Tabs tablet Take 1 tablet by mouth daily.   omeprazole 20 MG capsule Commonly known as:  PRILOSEC TAKE 1 CAPSULE BY MOUTH EVERY DAY   vitamin B-12 500 MCG tablet Commonly known as:  CYANOCOBALAMIN Take 1 tablet (500 mcg total) by mouth daily.   VOLTAREN 1 % Gel Generic drug:  diclofenac sodium Apply 3 g topically daily as needed (pain). To 3 large joints      Allergies  Allergen Reactions  . Monopril [Fosinopril] Cough  . Norvasc [Amlodipine Besylate] Other (See Comments)    10 mg dose causes LE edema.  Tolerates 5mg51mse.      The results of significant diagnostics from this hospitalization (including imaging, microbiology, ancillary and laboratory) are listed below for reference.    Significant Diagnostic Studies: Dg Chest 2 View  Result Date: 07/11/2018 CLINICAL DATA:  Cough EXAM: CHEST - 2 VIEW COMPARISON:  06/03/2016 chest radiograph. FINDINGS: Stable cardiomediastinal silhouette with normal heart size. No pneumothorax. No pleural effusion. Lungs appear clear, with no acute consolidative airspace disease and no pulmonary edema. IMPRESSION: No active cardiopulmonary disease. Electronically Signed   By: JasoIlona Sorrel.   On: 07/11/2018 15:51   Ct Chest W Contrast  Result Date: 07/11/2018 CLINICAL DATA:  Cough with generalized weakness, nausea and vomiting x1 week. Periumbilical pain x1 day. Low EGFR 51. EXAM: CT CHEST, ABDOMEN, AND PELVIS WITH CONTRAST TECHNIQUE: Multidetector CT imaging of the chest, abdomen and pelvis was performed following the standard protocol during bolus administration of intravenous contrast. CONTRAST:  75mL37mIPAQUE IOHEXOL 300 MG/ML  SOLN COMPARISON:  None. FINDINGS: CT CHEST FINDINGS Cardiovascular: Normal size heart with dense  left main and three-vessel coronary arteriosclerosis. No pericardial effusion or thickening. Aortic atherosclerosis without dissection or aneurysm. Suboptimal assessment of the pulmonary arteries beyond the proximal lobar level due to contrast volume and timing. No large central pulmonary embolus is identified. Patent great vessels with atherosclerosis. Mediastinum/Nodes: Dominant nodule in the left thyroid gland measuring 16 x 13 mm, series 2/11. No thyromegaly. Patent trachea and mainstem bronchi. No mediastinal nor hilar adenopathy. The esophagus is unremarkable. Lungs/Pleura: No confluent airspace disease. Dependent atelectasis at each base. No effusion or pneumothorax. Musculoskeletal: Small bone islands of the T6 vertebral body. Thoracic spondylosis with multi level mild disc space narrowing and endplate spurring. No aggressive osseous lesions. CT ABDOMEN PELVIS FINDINGS Hepatobiliary: No focal liver abnormality is seen. Status post cholecystectomy. No biliary dilatation.  Pancreas: Unremarkable. No pancreatic ductal dilatation or surrounding inflammatory changes. Spleen: Normal in size without focal abnormality. Adrenals/Urinary Tract: Normal bilateral adrenal glands. Simple cortical and exophytic cysts are noted of the right kidney the largest arising off the posterior aspect measuring 8.3 x 6.3 x 5.9 cm. Small parapelvic and cortical cysts are noted of the left kidney. Tiny nonobstructing interpolar calculus is seen of the left kidney. No obstructive uropathy. Symmetric pyelograms are noted both kidneys. Urinary bladder is opacified with contrast and demonstrates no focal mural thickening, diverticulum or intraluminal mass. Stomach/Bowel: Stomach is within normal limits. Appendix appears normal and contrast filled. No evidence of bowel wall thickening, distention, or inflammatory changes. Scattered colonic diverticulosis without acute diverticulitis. Vascular/Lymphatic: Aortoiliac atherosclerosis without  aneurysm or adenopathy. Reproductive: Hysterectomy. No adnexal mass. Other: No free air nor free fluid. Musculoskeletal: Dextroconvex scoliosis of the thoracic spine with lumbar spondylosis. IMPRESSION: 1. 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 2. No large central pulmonary embolus. Suboptimal assessment of the pulmonary arteries beyond the proximal lobar level due to contrast volume and timing. 3. Dominant nodule in the left thyroid gland measuring 16 x 13 mm. This can be correlated with thyroid ultrasound. This follows ACR consensus guidelines: Managing Incidental Thyroid Nodules Detected on Imaging: White Paper of the ACR Incidental Thyroid Findings Committee. J Am Coll Radiol 2015; 12:143-150. 4. Simple and exophytic cysts of the right kidney measuring 8.3 x 6.3 x 5.9 cm. Tiny nonobstructing interpolar calculus of the left kidney. 5. Colonic diverticulosis without acute diverticulitis. 6. Dextroconvex scoliosis of the thoracic spine with lumbar spondylosis. Aortic Atherosclerosis (ICD10-I70.0). Electronically Signed   By: Ashley Royalty M.D.   On: 07/11/2018 18:23   Ct Abdomen Pelvis W Contrast  Result Date: 07/11/2018 CLINICAL DATA:  Cough with generalized weakness, nausea and vomiting x1 week. Periumbilical pain x1 day. Low EGFR 51. EXAM: CT CHEST, ABDOMEN, AND PELVIS WITH CONTRAST TECHNIQUE: Multidetector CT imaging of the chest, abdomen and pelvis was performed following the standard protocol during bolus administration of intravenous contrast. CONTRAST:  72m OMNIPAQUE IOHEXOL 300 MG/ML  SOLN COMPARISON:  None. FINDINGS: CT CHEST FINDINGS Cardiovascular: Normal size heart with dense left main and three-vessel coronary arteriosclerosis. No pericardial effusion or thickening. Aortic atherosclerosis without dissection or aneurysm. Suboptimal assessment of the pulmonary arteries beyond the proximal lobar level due to contrast volume and timing. No large central pulmonary embolus is  identified. Patent great vessels with atherosclerosis. Mediastinum/Nodes: Dominant nodule in the left thyroid gland measuring 16 x 13 mm, series 2/11. No thyromegaly. Patent trachea and mainstem bronchi. No mediastinal nor hilar adenopathy. The esophagus is unremarkable. Lungs/Pleura: No confluent airspace disease. Dependent atelectasis at each base. No effusion or pneumothorax. Musculoskeletal: Small bone islands of the T6 vertebral body. Thoracic spondylosis with multi level mild disc space narrowing and endplate spurring. No aggressive osseous lesions. CT ABDOMEN PELVIS FINDINGS Hepatobiliary: No focal liver abnormality is seen. Status post cholecystectomy. No biliary dilatation. Pancreas: Unremarkable. No pancreatic ductal dilatation or surrounding inflammatory changes. Spleen: Normal in size without focal abnormality. Adrenals/Urinary Tract: Normal bilateral adrenal glands. Simple cortical and exophytic cysts are noted of the right kidney the largest arising off the posterior aspect measuring 8.3 x 6.3 x 5.9 cm. Small parapelvic and cortical cysts are noted of the left kidney. Tiny nonobstructing interpolar calculus is seen of the left kidney. No obstructive uropathy. Symmetric pyelograms are noted both kidneys. Urinary bladder is opacified with contrast and demonstrates no focal mural thickening, diverticulum or intraluminal mass. Stomach/Bowel: Stomach  is within normal limits. Appendix appears normal and contrast filled. No evidence of bowel wall thickening, distention, or inflammatory changes. Scattered colonic diverticulosis without acute diverticulitis. Vascular/Lymphatic: Aortoiliac atherosclerosis without aneurysm or adenopathy. Reproductive: Hysterectomy. No adnexal mass. Other: No free air nor free fluid. Musculoskeletal: Dextroconvex scoliosis of the thoracic spine with lumbar spondylosis. IMPRESSION: 1. 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 2. No large central pulmonary embolus.  Suboptimal assessment of the pulmonary arteries beyond the proximal lobar level due to contrast volume and timing. 3. Dominant nodule in the left thyroid gland measuring 16 x 13 mm. This can be correlated with thyroid ultrasound. This follows ACR consensus guidelines: Managing Incidental Thyroid Nodules Detected on Imaging: White Paper of the ACR Incidental Thyroid Findings Committee. J Am Coll Radiol 2015; 12:143-150. 4. Simple and exophytic cysts of the right kidney measuring 8.3 x 6.3 x 5.9 cm. Tiny nonobstructing interpolar calculus of the left kidney. 5. Colonic diverticulosis without acute diverticulitis. 6. Dextroconvex scoliosis of the thoracic spine with lumbar spondylosis. Aortic Atherosclerosis (ICD10-I70.0). Electronically Signed   By: Ashley Royalty M.D.   On: 07/11/2018 18:23    Microbiology: Recent Results (from the past 240 hour(s))  Blood Culture (routine x 2)     Status: None (Preliminary result)   Collection Time: 07/11/18  3:32 PM  Result Value Ref Range Status   Specimen Description   Final    BLOOD LEFT ANTECUBITAL Performed at [facility], [place] [address] [place], [place] [address]    Special Requests   Final    BOTTLES DRAWN AEROBIC AND ANAEROBIC Blood Culture adequate volume Performed at [facility] [address] [place], [place] [address]    Culture   Final    NO GROWTH < 24 HOURS Performed at [facility] [address], [place], [place] [address]    Report Status PENDING  Incomplete  Blood Culture (routine x 2)     Status: None (Preliminary result)   Collection Time: 07/11/18  4:00 PM  Result Value Ref Range Status   Specimen Description   Final    BLOOD RIGHT ANTECUBITAL Performed at [facility] [address] [place], [place] [address]    Special Requests   Final    BOTTLES DRAWN AEROBIC AND ANAEROBIC Blood Culture adequate volume Performed at [facility]  [address] [place], [place] [address]    Culture   Final    NO GROWTH < 24 HOURS Performed at [facility] [address] [place], [place] [address]    Report Status PENDING  Incomplete     Labs: Basic Metabolic Panel: Recent Labs  Lab 07/11/18 1532 07/12/18 0508 07/13/18 0437  NA 142 141 139  K 3.0* 3.1* 5.1  CL 98 103 111  CO2 '29 27 23  ' GLUCOSE 234* 169* 106*  BUN 21 25* 29*  CREATININE 1.05* 0.91 0.87  CALCIUM 9.4 8.5* 7.8*  MG  --  1.3* 2.6*   Liver Function Tests: Recent Labs  Lab 07/11/18 1532 07/13/18 0437  AST 35 24  ALT 25 16  ALKPHOS 125 76  BILITOT 1.3* 0.6  PROT 8.6* 5.3*  ALBUMIN 4.0 2.4*   No results for input(s): LIPASE, AMYLASE in the last 168 hours. No results for input(s): AMMONIA in the last 168 hours. CBC: Recent Labs  Lab 07/11/18 1532 07/12/18 0508 07/13/18 0437  WBC 17.6* 14.0* 6.7  NEUTROABS 14.2* 10.9*  --   HGB  16.2* 14.6 12.3  HCT 50.2* 45.3 39.3  MCV 97.5 97.8 100.0  PLT 228 208 168   Cardiac Enzymes: No results for input(s): CKTOTAL, CKMB, CKMBINDEX, TROPONINI in the last 168 hours. BNP: BNP (last 3 results) No results for input(s): BNP in the last 8760 hours.  ProBNP (last 3 results) No results for input(s): PROBNP in the last 8760 hours.  CBG: Recent Labs  Lab 07/12/18 1317 07/12/18 1922 07/12/18 2145 07/13/18 0726 07/13/18 1140  GLUCAP 146* 176* 99 105* 168*       Signed:  Fayrene Helper MD.  [facility] 07/13/2018, 12:39 PM

## 2018-07-13 NOTE — Telephone Encounter (Signed)
Spoke to patient, she will come by next week to complete application

## 2018-07-13 NOTE — Progress Notes (Addendum)
Patient verbalized understanding of discharge instructions. Urine sample was collected and sent to lab. AVS was given to the patient, PCP appointment on AVS. Appointment was scheduled with PCP and patient is aware. Patient is stable at discharge.

## 2018-07-13 NOTE — Progress Notes (Signed)
Initial Nutrition Assessment  DOCUMENTATION CODES:   Non-severe (moderate) malnutrition in context of acute illness/injury  INTERVENTION:    Boost Breeze po TID, each supplement provides 250 kcal and 9 grams of protein  Provide MVI daily  NUTRITION DIAGNOSIS:   Moderate Malnutrition related to acute illness(recurrent nausea/vomiting) as evidenced by percent weight loss, energy intake < 75% for > 7 days.  GOAL:   Patient will meet greater than or equal to 90% of their needs  MONITOR:   Supplement acceptance, PO intake, Diet advancement, Labs, I & O's, Weight trends  REASON FOR ASSESSMENT:   Malnutrition Screening Tool    ASSESSMENT:   Patient with PMH significant for HTN, DM, and RA. Presents this admission for evaluation of generalized weakness, nausea with nonbloody vomiting, rhinorrhea, and cough. Admitted for SIRS, most likely a viral illness.    Pt endorses having a loss in appetite 2-3 weeks PTA. States during this time period she could only tolerate bites of her normal meals. Prior to her poor appetite she was eating 2-3 meals daily that consisted of a smaller breakfast, a deli sandwich with soup for lunch, and a meat with two vegetables for dinner. Pt is currently on a clear liquid diet and tolerated sips this morning without resulting nausea/vomiting. Discussed the importance of protein intake for preservation of lean body mass while on a liquid diet. Continue Boost Breeze.   Pt endorses a UBW of 180 lb, the last time being at that weight two months ago. Records indicate pt weighed 184 lb in 04/26/18 and 165 lb this admission (10.3% wt loss in two months, significant for time frame). Nutrition-Focused physical exam completed.   Medications reviewed and include: folic acid, mag-ox, MVI with minerals, Vit B12 Labs reviewed: Mg 2.6 (L)   NUTRITION - FOCUSED PHYSICAL EXAM:    Most Recent Value  Orbital Region  No depletion  Upper Arm Region  No depletion  Thoracic and  Lumbar Region  Unable to assess  Buccal Region  No depletion  Temple Region  Moderate depletion  Clavicle Bone Region  No depletion  Clavicle and Acromion Bone Region  No depletion  Scapular Bone Region  Unable to assess  Dorsal Hand  No depletion  Patellar Region  No depletion  Anterior Thigh Region  No depletion  Posterior Calf Region  No depletion  Edema (RD Assessment)  None     Diet Order:   Diet Order            Diet clear liquid Room service appropriate? Yes; Fluid consistency: Thin  Diet effective now              EDUCATION NEEDS:   Education needs have been addressed  Skin:  Skin Assessment: Skin Integrity Issues: Skin Integrity Issues:: Other (Comment) Other: non pressure- right buttocks  Last BM:  07/12/18  Height:   Ht Readings from Last 1 Encounters:  07/12/18 4\' 11"  (1.499 m)    Weight:   Wt Readings from Last 1 Encounters:  07/13/18 76.5 kg    Ideal Body Weight:  44.3 kg  BMI:  Body mass index is 34.07 kg/m.  Estimated Nutritional Needs:   Kcal:  1400-1600 kcal  Protein:  70-85 grams  Fluid:  >/= 1.4 L/day   Mariana Single RD, LDN Clinical Nutrition Pager # - [phone]

## 2018-07-16 LAB — URINE CULTURE: Culture: 10000 — AB

## 2018-07-16 LAB — CULTURE, BLOOD (ROUTINE X 2)
Culture: NO GROWTH
Culture: NO GROWTH
SPECIAL REQUESTS: ADEQUATE
Special Requests: ADEQUATE

## 2018-07-17 ENCOUNTER — Ambulatory Visit: Payer: PPO

## 2018-07-17 ENCOUNTER — Other Ambulatory Visit: Payer: Self-pay | Admitting: Pharmacist

## 2018-07-17 NOTE — Patient Outreach (Signed)
[name] [facility]) Care Management  07/17/2018  [name] [birth year] [id number]   72 year old female outreached by [facility] services for a 30 day post discharge medication review.  PMHx includes, but not limited to, diabetes, hyperlipidemia, hypertension, GERD, and rheumatoid arthritis.   Call attempt #1 unsuccessful with no answer. I will route note for [facility] [name] to make another outreach attempt to patient within 3-4 business days.   [name], Sherian Rein D PGY1 Pharmacy Resident  Phone [phone] 07/17/2018   9:24 AM

## 2018-07-19 ENCOUNTER — Other Ambulatory Visit: Payer: Self-pay | Admitting: Family Medicine

## 2018-07-19 MED ORDER — METOPROLOL TARTRATE 25 MG PO TABS: 12.5000 mg | ORAL_TABLET | Freq: Two times a day (BID) | ORAL | 0 refills | Status: DC

## 2018-07-24 ENCOUNTER — Other Ambulatory Visit: Payer: Self-pay

## 2018-07-24 ENCOUNTER — Ambulatory Visit: Payer: Self-pay

## 2018-07-24 NOTE — Patient Outreach (Signed)
[facility]) Care Management  [name]   07/24/2018  [name] October 27, 1945 [id number]   Reason for referral: Post discharge medication reconciliation    PMHx: 72 year old female with diabetes, hyperlipidemia, hypertension, GERD, and rheumatoid arthritis.   Outreach:  Patient reports she is doing well since discharge. She denies cough, fever, nausea or vomiting. She reports her weakness has improved and she is close to her baseline. She does not check her blood glucose regularly, but reports at her doctor visits she has good numbers. She does not check her blood pressure regularly. Patient reports all medications are affordable.   Objective: Lab Results  Component Value Date   CREATININE 0.87 07/13/2018   CREATININE 0.91 07/12/2018   CREATININE 1.05 (H) 07/11/2018    Lab Results  Component Value Date   HGBA1C 7.4 (H) 04/20/2018    Lipid Panel     Component Value Date/Time   CHOL 187 01/19/2018 0935   TRIG 103 01/19/2018 0935   HDL 79 01/19/2018 0935   CHOLHDL 2.4 01/19/2018 0935   VLDL 31 (H) 01/19/2017 0825   LDLCALC 88 01/19/2018 0935    BP Readings from Last 3 Encounters:  07/13/18 123/63  07/11/18 (!) 140/98  04/26/18 (!) 156/80    Allergies  Allergen Reactions  . Monopril [Fosinopril] Cough  . Norvasc [Amlodipine Besylate] Other (See Comments)    10 mg dose causes LE edema.  Tolerates 5mg  dose.    Medications Reviewed Today    Reviewed by Otilio Connors, CPhT (Pharmacy Technician) on 07/11/18 at [facility] List Status: Complete  Medication Order Taking? Sig Documenting Provider Last Dose Status Informant  amLODipine (NORVASC) 5 MG tablet [id number] Yes TAKE 1 TABLET BY MOUTH EVERY DAY Susy Frizzle, MD Past Week Unknown time Active Self  aspirin EC 81 MG tablet [id number] Yes Take 81 mg by mouth daily. [provider] Past Week Unknown time Active Self           Med Note Jillyn Hidden, ASHLEY L   Tue May 01, 2013  12:40 PM)     atorvastatin (LIPITOR) 10 MG tablet [id number] Yes TAKE 1 TABLET DAILY Orlena Sheldon, PA-C Past Week Unknown time Active Self  Calcium Carbonate-Vitamin D (CALCIUM 600 + D PO) [id number] Yes Take 1 tablet by mouth 2 (two) times daily. [provider] Past Week Unknown time Active Self  cloNIDine (CATAPRES) 0.3 MG tablet [id number] No TAKE 1 TABLET (0.3 MG TOTAL) BY MOUTH 2 (TWO) TIMES DAILY. Dena Billet B, PA-C 07/09/2018 unknown time Active Self  cyanocobalamin 500 MCG tablet [id number] Yes Take 1 tablet (500 mcg total) by mouth daily. Caren Griffins, MD Past Week Unknown time Active Self  diclofenac sodium (VOLTAREN) 1 % GEL [id number] Yes Apply 3 g topically daily as needed (pain). To 3 large joints [provider] 07/10/2018 Unknown time Active Self  ENBREL SURECLICK 50 MG/ML injection [id number] No INJECT 50 MG (0.98 ML) UNDER THE SKIN ONCE A WEEK  Patient taking differently:  Inject 50 mg into the skin once a week.    Eliezer Lofts, PA-C 07/04/2018 unknown time Active Self  estradiol (ESTRACE) 0.5 MG tablet [id number] Yes Take 0.5 mg by mouth 2 (two) times daily. [provider] Past Week Unknown time Active Self  Ferrous Sulfate (IRON) 325 (65 Fe) MG TABS [id number] Yes Take 1 tablet by mouth daily.  [provider] Past Week Unknown time Active Self  fish oil-omega-3 fatty acids 1000 MG  capsule [id number] Yes Take 1 g by mouth at bedtime.  [provider] Past Week Unknown time Active Self           Med Note Jillyn Hidden, ASHLEY L   Tue May 01, 2013 33:82 PM)     folic acid (FOLVITE) 1 MG tablet [id number] Yes TAKE 1 TABLET BY MOUTH TWICE A DAY Dixon, Mary B, PA-C Past Week Unknown time Active Self  leflunomide (ARAVA) 10 MG tablet [id number] Yes TAKE 1 TABLET BY MOUTH EVERY DAY Deveshwar, Abel Presto, MD Past Week Unknown time Active Self  losartan (COZAAR) 100 MG tablet [id number] No Please specify directions, refills and quantity  Patient not  taking:  Reported on 07/11/2018   Orlena Sheldon, PA-C Not Taking Unknown time Consider Medication Status and Discontinue (Dose change) Self  losartan-hydrochlorothiazide (HYZAAR) 100-12.5 MG tablet [id number] Yes Take 1 tablet by mouth daily. [provider] Past Week Unknown time Active Self  magnesium oxide (MAG-OX) 400 MG tablet [id number] Yes Take 2 tablets 400mg  (total 800mg ) in the morning, and 400mg  in the evening  Patient taking differently:  Take 800 mg by mouth 2 (two) times daily.    Orlena Sheldon, PA-C Past Week Unknown time Active Self  metFORMIN (GLUCOPHAGE) 1000 MG tablet [id number] No Take 1 tablet (1,000 mg total) by mouth 2 (two) times daily with a meal.  Patient not taking:  Reported on 07/11/2018   Orlena Sheldon, PA-C Not Taking Unknown time Consider Medication Status and Discontinue (Dose change) Self  metFORMIN (GLUCOPHAGE) 500 MG tablet [id number] Yes Take 500 mg by mouth 2 (two) times daily with a meal.  [provider] Past Week Unknown time Active Self  metoprolol tartrate (LOPRESSOR) 25 MG tablet [id number] No TAKE 1/2 TABLET TWICE A DAY  Patient taking differently:  Take 12.5 mg by mouth 2 (two) times daily. TAKE 1/2 TABLET TWICE A DAY   Orlena Sheldon, PA-C 07/09/2018 unknown time Active Self  Multiple Vitamins-Minerals (MULTIVITAMINS THER. W/MINERALS) Sheral Flow [id number] Yes Take 1 tablet by mouth daily. [provider] 07/10/2018 Unknown time Active Self  omeprazole (PRILOSEC) 20 MG capsule [id number] Yes TAKE 1 CAPSULE BY MOUTH EVERY DAY Orlena Sheldon, PA-C Past Week Unknown time Active Self  Med List Note (Wynn, [place] D, CPhT 03/04/16 7672): [id number] ---          ASSESSMENT: Date Discharged from Hospital: 07/11/18 Date Medication Reconciliation Performed: 07/24/2018  New Medications at Discharge:   Benzonatate 100mg  capsules   Patient was recently discharged from hospital and all medications have been reviewed.  Drugs sorted by  system:  Cardiovascular: amlodipine, aspirin, atorvastatin, clonidine, losartan/HCTZ, metoprolol   Gastrointestinal: omeprazole   Endocrine: estradiol, metformin   Rheumatoid: Enbrel, leflunomide   Topical: diclofenac gel  Vitamins/Minerals/Supplements: Calcium with vitamin D, cyanocobalamin, iron, omega-3, folic acid, magnesium, multivitamin   Miscellaneous: benzonatate   Medication Review Findings:  . Consider ongoing review of need for estradiol replacement balancing risk and benefits with patient   . Per the [place] List, clonidine should be avoided as first line antihypertensive agent in elderly due to adverse CNS events. Consider ongoing review for need of clonidine and possible taper if blood pressure controlled.   PLAN: -Instructed patient to take medications as prescribed and follow up with provider as scheduled.   Isaias Sakai, Sherian Rein D PGY1 Pharmacy Resident  07/24/2018      10:51 AM

## 2018-07-24 NOTE — Telephone Encounter (Signed)
Patient came in an signed renewal application. Will fax once MD signature has been received.  Patient also stated [employer] says she needs 1 more refill to be sent in for 2019. Printed form and will give to [facility] LPN  54:23 AM Beatriz Chancellor, CPhT

## 2018-07-25 ENCOUNTER — Other Ambulatory Visit: Payer: Self-pay | Admitting: *Deleted

## 2018-07-25 MED ORDER — ETANERCEPT 50 MG/ML ~~LOC~~ SOAJ: SUBCUTANEOUS | 0 refills | Status: DC

## 2018-07-25 NOTE — Telephone Encounter (Signed)
Called [employer] to see about renewing Enbrel prior authorization for 2020. Was advised by rep Phineas Real, that the patient's 2020 formulary does not require a PA for patients already on therapy. Requested documentation stating this, was advised to refer to online formulary. Checked and it does say on the plan's online formulary.  9:53 AM Beatriz Chancellor, CPhT

## 2018-07-25 NOTE — Telephone Encounter (Signed)
Refill request for Enbrel  Last Visit: 04/26/18 Next visit: 09/27/18 Labs: 07/13/18 stable TB gold: 04/21/18 Neg   Okay to refill per Dr. Estanislado Pandy  Prescription faxed to [facility]

## 2018-07-27 ENCOUNTER — Ambulatory Visit (INDEPENDENT_AMBULATORY_CARE_PROVIDER_SITE_OTHER): Payer: PPO | Admitting: Family Medicine

## 2018-07-27 ENCOUNTER — Ambulatory Visit: Payer: PPO | Admitting: Physician Assistant

## 2018-07-27 ENCOUNTER — Encounter: Payer: Self-pay | Admitting: Family Medicine

## 2018-07-27 VITALS — BP 110/80 | HR 74 | Temp 98.3°F | Resp 15 | Ht <= 58 in | Wt 172.0 lb

## 2018-07-27 DIAGNOSIS — R112 Nausea with vomiting, unspecified: Secondary | ICD-10-CM

## 2018-07-27 DIAGNOSIS — R7989 Other specified abnormal findings of blood chemistry: Secondary | ICD-10-CM

## 2018-07-27 DIAGNOSIS — Z79899 Other long term (current) drug therapy: Secondary | ICD-10-CM

## 2018-07-27 DIAGNOSIS — Z09 Encounter for follow-up examination after completed treatment for conditions other than malignant neoplasm: Secondary | ICD-10-CM

## 2018-07-27 DIAGNOSIS — E041 Nontoxic single thyroid nodule: Secondary | ICD-10-CM

## 2018-07-27 DIAGNOSIS — R809 Proteinuria, unspecified: Secondary | ICD-10-CM

## 2018-07-27 DIAGNOSIS — N179 Acute kidney failure, unspecified: Secondary | ICD-10-CM | POA: Diagnosis not present

## 2018-07-27 DIAGNOSIS — I2510 Atherosclerotic heart disease of native coronary artery without angina pectoris: Secondary | ICD-10-CM

## 2018-07-27 DIAGNOSIS — Z79890 Hormone replacement therapy (postmenopausal): Secondary | ICD-10-CM | POA: Diagnosis not present

## 2018-07-27 LAB — URINALYSIS, ROUTINE W REFLEX MICROSCOPIC
Bacteria, UA: NONE SEEN /HPF
Bilirubin Urine: NEGATIVE
Glucose, UA: NEGATIVE
HGB URINE DIPSTICK: NEGATIVE
KETONES UR: NEGATIVE
LEUKOCYTES UA: NEGATIVE
NITRITE: NEGATIVE
RBC / HPF: NONE SEEN /HPF (ref 0–2)
SPECIFIC GRAVITY, URINE: 1.02 (ref 1.001–1.03)
WBC UA: NONE SEEN /HPF (ref 0–5)
pH: 7 (ref 5.0–8.0)

## 2018-07-27 LAB — MICROSCOPIC MESSAGE

## 2018-07-27 NOTE — Progress Notes (Signed)
Patient ID: [name], female    DOB: 1945/10/30, 72 y.o.   MRN: [id number]  PCP: Orlena Sheldon, PA-C  Chief Complaint  Patient presents with  . Hospitalization Follow-up    Patient states she is doing much better since hospitalization. She has no concerns this visit.    Subjective:   [name] is a 72 y.o. female, presents to clinic with CC of follow up from hospitalization a few weeks ago. Subjective Here for hospital follow up.     Admit date:07/11/18 Discharge date: 07/13/18  She was admitted for SIRS secondary to N/V - suspected viral gastroenteritis with general weakness, rhinorrhea, cough.  She was sent to the hospital after being extremely weak and orthostatic and clinic during her office visit.  When she presented to the ER she was also extremely hypertensive and tachycardic, positive lactic acid.  Empiric antibiotic coverage was initiated and she received a 2.25 L of IV fluids, urine culture, blood culture and influenza PCR were obtained. Abdominal exam for her abdomen and negative CT, suspected secondary to viral illness. Potassium was low and subsequently repleted, normal at the time of discharge.  Incidental finding of thyroid nodule on CT, this does correlate with the previous thyroid ultrasound 2017.  Discharge instructions instructed to follow-up outpatient with PCP.  Discharge summary was not routed to me and has subsequently now been reviewed.  Recommended outpatient follow-up included repeat of labs, CBC/CMP, follow-up thyroid nodule, follow-up proteinuria, follow-up on cultures.  CT scan also incidentally found coronary artery disease and hormonal placement therapy and risk/benefit was also instructed to be followed up outpatient with PCP.  There were no home medication changes the patient was discharged.  Patient states that since discharge, 2 weeks ago, she has returned to normal bowels, normal energy she is having normal urination, appetite and energy.   She did lose a few pounds but she has slowly been regaining them.  She remembers having her thyroid ultrasound it a few years ago but does not know if she has had any surgeries or procedures or biopsies.  Will need to review the chart  Patient states she has been on hormone replacement therapy since having a total hysterectomy many many years ago, this medication has been prescribed by her GYN she states.  She does not want to stop taking it and in the past when she has suddenly been out of her medication she becomes extremely agitated and irritable.  She denies any exertional chest pain, she denies any past cardiac events.  She is on Lipitor 10 mg, she is compliant with her blood pressure medications and she has not noted any high or low blood pressures, has not had a near syncope, palpitations.    Reviewed family hx, mother had MI, father had stroke.  She has MHx of HTN, HLD, DM, obesity, CAD, former smoker.  Patient Active Problem List   Diagnosis Date Noted  . SIRS (systemic inflammatory response syndrome) ([place]) 07/11/2018  . Hypokalemia 07/11/2018  . Nausea & vomiting 07/11/2018  . Hypertensive urgency 07/11/2018  . Diabetes mellitus type 2, uncomplicated ([place]) 94/85/4627  . History of total bilateral knee replacement 06/22/2017  . History of hypertension 08/20/2016  . High risk medication use 08/20/2016  . Primary osteoarthritis of both hands 08/20/2016  . Hypomagnesemia 07/21/2016  . Thyroid nodule 03/05/2016  . Diabetic eye exam ([place]) 10/15/2014  . Multiple pulmonary nodules 04/04/2014  . Malnutrition of moderate degree ([place]) 03/18/2014  . Sepsis ([place]) 03/16/2014  .  Anemia 11/21/2013  . 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 Bursitis of right shoulder   . GERD (gastroesophageal reflux disease)   . Morbid obesity due to excess calories ([place]) 12/27/2012  . 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 Essential hypertension,  benign 10/14/2012  . Hyperlipidemia   . Hormone replacement therapy (postmenopausal)   . Fatty liver disease, nonalcoholic   . Osteoarthritis of right knee 08/05/2012     Prior to Admission medications   Medication Sig Start Date End Date Taking? Authorizing Provider  amLODipine (NORVASC) 5 MG tablet TAKE 1 TABLET BY MOUTH EVERY DAY 07/11/18  Yes Susy Frizzle, MD  aspirin EC 81 MG tablet Take 81 mg by mouth daily.   Yes [provider]  atorvastatin (LIPITOR) 10 MG tablet TAKE 1 TABLET DAILY 01/31/18  Yes Dena Billet B, PA-C  Calcium Carbonate-Vitamin D (CALCIUM 600 + D PO) Take 1 tablet by mouth 2 (two) times daily.   Yes [provider]  cloNIDine (CATAPRES) 0.3 MG tablet TAKE 1 TABLET (0.3 MG TOTAL) BY MOUTH 2 (TWO) TIMES DAILY. 02/14/18  Yes Orlena Sheldon, PA-C  cyanocobalamin 500 MCG tablet Take 1 tablet (500 mcg total) by mouth daily. 03/20/14  Yes Caren Griffins, MD  diclofenac sodium (VOLTAREN) 1 % GEL Apply 3 g topically daily as needed (pain). To 3 large joints   Yes [provider]  estradiol (ESTRACE) 0.5 MG tablet Take 0.5 mg by mouth 2 (two) times daily.   Yes [provider]  etanercept (ENBREL SURECLICK) 50 MG/ML injection INJECT 50 MG (0.98 ML) UNDER THE SKIN ONCE A WEEK 07/25/18  Yes Deveshwar, Abel Presto, MD  Ferrous Sulfate (IRON) 325 (65 Fe) MG TABS Take 1 tablet by mouth daily.    Yes [provider]  fish oil-omega-3 fatty acids 1000 MG capsule Take 1 g by mouth at bedtime.    Yes [provider]  folic acid (FOLVITE) 1 MG tablet TAKE 1 TABLET BY MOUTH TWICE A DAY 05/22/18  Yes Dixon, Mary B, PA-C  leflunomide (ARAVA) 10 MG tablet TAKE 1 TABLET BY MOUTH EVERY DAY 05/23/18  Yes Deveshwar, Abel Presto, MD  losartan-hydrochlorothiazide (HYZAAR) 100-12.5 MG tablet Take 1 tablet by mouth daily. 06/01/18  Yes [provider]  magnesium oxide (MAG-OX) 400 MG tablet Take 2 tablets 400mg  (total 800mg ) in the morning, and  400mg  in the evening Patient taking differently: Take 800 mg by mouth 2 (two) times daily.  01/20/18  Yes Orlena Sheldon, PA-C  metFORMIN (GLUCOPHAGE) 500 MG tablet Take 500 mg by mouth 2 (two) times daily with a meal.  05/05/18  Yes [provider]  metoprolol tartrate (LOPRESSOR) 25 MG tablet Take 0.5 tablets (12.5 mg total) by mouth 2 (two) times daily. 07/19/18  Yes Susy Frizzle, MD  Multiple Vitamins-Minerals (MULTIVITAMINS THER. W/MINERALS) TABS Take 1 tablet by mouth daily.   Yes [provider]  omeprazole (PRILOSEC) 20 MG capsule TAKE 1 CAPSULE BY MOUTH EVERY DAY 01/09/18  Yes Orlena Sheldon, PA-C     Allergies  Allergen Reactions  . Monopril [Fosinopril] Cough  . Norvasc [Amlodipine Besylate] Other (See Comments)    10 mg dose causes LE edema.  Tolerates 5mg  dose.     Family History  Problem Relation Age of Onset  . Cancer Mother        breast  . Heart attack Mother   . Stroke Father   .  Diabetes Sister   . Diabetes Daughter      Social History   Socioeconomic History  . Marital status: Divorced    Spouse name: Not on file  . Number of children: Not on file  . Years of education: Not on file  . Highest education level: Not on file  Occupational History  . Not on file  Social Needs  . Financial resource strain: Not on file  . Food insecurity:    Worry: Not on file    Inability: Not on file  . Transportation needs:    Medical: Not on file    Non-medical: Not on file  Tobacco Use  . Smoking status: Former Smoker    Packs/day: 0.25    Years: 2.00    Pack years: 0.50    Types: Cigarettes    Last attempt to quit: 11/21/2013    Years since quitting: 4.6  . Smokeless tobacco: Never Used  . Tobacco comment: smoked regulary for approx 10 yrs- stopped in her 12's- but still smokes occ cig/Aug 2015  Substance and Sexual Activity  . Alcohol use: Yes    Comment: occasional  . Drug use: Never  . Sexual activity: Never  Lifestyle  . Physical  activity:    Days per week: Not on file    Minutes per session: Not on file  . Stress: Not on file  Relationships  . Social connections:    Talks on phone: Not on file    Gets together: Not on file    Attends religious service: Not on file    Active member of club or organization: Not on file    Attends meetings of clubs or organizations: Not on file    Relationship status: Not on file  . Intimate partner violence:    Fear of current or ex partner: Not on file    Emotionally abused: Not on file    Physically abused: Not on file    Forced sexual activity: Not on file  Other Topics Concern  . Not on file  Social History Narrative  . Not on file     Review of Systems  Constitutional: Negative.   HENT: Negative.   Eyes: Negative.   Respiratory: Negative.   Cardiovascular: Negative.   Gastrointestinal: Negative.   Endocrine: Negative.   Genitourinary: Negative.   Musculoskeletal: Negative.   Skin: Negative.   Allergic/Immunologic: Negative.   Neurological: Negative.   Hematological: Negative.   Psychiatric/Behavioral: Negative.   10 Systems reviewed and are negative for acute change except as noted in the HPI.      Objective:    Vitals:   07/27/18 1510  BP: 110/80  Pulse: 74  Resp: 15  Temp: 98.3 F (36.8 C)  TempSrc: Oral  SpO2: 97%  Weight: 172 lb (78 kg)  Height: 4\' 10"  (1.473 m)      Physical Exam  Constitutional: She appears well-developed and well-nourished. No distress.  Well appearing, obese elderly female, alert, NAD, non-toxic appearing  HENT:  Head: Normocephalic and atraumatic.  Nose: Nose normal.  Mouth/Throat: Oropharynx is clear and moist.  Eyes: Pupils are equal, round, and reactive to light. Conjunctivae are normal. Right eye exhibits no discharge. Left eye exhibits no discharge. No scleral icterus.  Neck: Normal range of motion. Neck supple. No tracheal deviation present. No thyromegaly present.  Cardiovascular: Normal rate, regular  rhythm, normal heart sounds and intact distal pulses. Exam reveals no gallop and no friction rub.  No murmur heard. Pulmonary/Chest: Effort  normal and breath sounds normal. No stridor. No respiratory distress. She has no wheezes. She has no rales.  Abdominal: Soft. Bowel sounds are normal. She exhibits no distension and no mass. There is no tenderness. There is no guarding.  No CVA tenderness  Musculoskeletal: Normal range of motion.  Lymphadenopathy:    She has no cervical adenopathy.  Neurological: She is alert. She exhibits normal muscle tone. Coordination normal.  Skin: Skin is warm and dry. No rash noted. She is not diaphoretic.  Psychiatric: She has a normal mood and affect. Her behavior is normal.  Nursing note and vitals reviewed.         Assessment & Plan:   72 y/o female here for follow up after hospitalization on 07/11/18. Discharge summary was reviewed, patient does feel much better, there were no medication changes.    ICD-10-CM   1. Proteinuria, unspecified type R80.9 Urinalysis, Routine w reflex microscopic    BASIC METABOLIC PANEL WITH GFR    Microalbumin/Creatinine Ratio, Urine   recheck UA and microalbumine, normal fluid intake and normal urination  2. Hypomagnesemia T03.54 BASIC METABOLIC PANEL WITH GFR    Magnesium   on supplement, recheck mag  3. Thyroid nodule E04.1 TSH    US Soft Tissue Head/Neck    [place] FNA BX THYROID 1ST LESION AFIRMA   CT scan from hospitalization reviewed, 2017 thyroid ultrasound reviewed I cannot find a biopsy though it was recommended in 2017  4. High risk medication use Z79.899    advised to d/c HRT due to risk of cardiac event with evidence of CAD, plus hx of DM, HTN and HLD, gradual taper  5. Coronary artery disease involving native coronary artery of native heart without angina pectoris I25.10   6. Encounter for examination following treatment at hospital Z09   7. Nausea and vomiting, intractability of vomiting not specified,  unspecified vomiting type R11.2   8. Hormone replacement therapy (postmenopausal) Z79.890    taper and D/C of estradiol HRT, feel risk of CVD with CAD and other med and FHx too high    The 10-year ASCVD risk score Mikey Bussing DC Jr., et al., 2013) is: 25.6%   Values used to calculate the score:     Age: 19 years     Sex: Female     Is Non-Hispanic African American: No     Diabetic: Yes     Tobacco smoker: No     Systolic Blood Pressure: 656 mmHg     Is BP treated: Yes     HDL Cholesterol: 79 mg/dL     Total Cholesterol: 187 mg/dL  CVD risk is extremely high -calculator does not take into consideration family history mother had an MI and father had a stroke additionally there is a family history and patient's mother of breast cancer.  Patient has been on hormone replacement therapy for many years I cannot find in the chart the approximate year that she had a total hysterectomy her went through menopause but I did discuss with her at length the high risk with continuing and that she should gradually decrease and taper off estradiol by starting with decreasing dose to 1 time a day and then every other day and if she has severe symptoms can further decrease to taking 2-3 doses a week until she is completely off the hormone replacement medication.  She is very hesitant to do this states she wanted to ask her GYN, and I told her that she is more than welcome to  check with her gynecologist however from her PCP clinic office the medical recommendation is to discontinue as soon as she can because of her high CVD 10 year risk.  Did offer Premarin cream or other vaginal suppositories to help with vaginal and urinary symptoms.    Patient was not fasting, we will obtain does basic labs to recheck renal function, urine and urine protein and TSH.  I did decide these labs by reviewing the patient's discharge summary and did explain that I would need to do further chart review to see what we needed to do for her thyroid  nodule and would let her know when labs resulted.    I later reviewed a different discharge summary which advise she is to have a CBC and CMP rechecked, I did not see this prior to the patient leaving the clinic today, since getting a chance to review more the patient's medical history, incidental findings while in hospital, new CAD in her current medications medical history etc. I do think she is going to need to return for a routine office visit anyhow since she is currently overdue for that to recheck A1c, do fasting lipid panel etc.  Pt recently was switched to my panel after her PCP suddenly left the practice, I had only seen her for one acute visit when she was quickly sent to the hospital due to her condition.  I will need to further get to know her PMHx and review chart.   Delsa Grana, PA-C 07/27/18 4:10 PM

## 2018-07-28 ENCOUNTER — Other Ambulatory Visit: Payer: PPO

## 2018-07-28 DIAGNOSIS — E041 Nontoxic single thyroid nodule: Secondary | ICD-10-CM | POA: Diagnosis not present

## 2018-07-28 DIAGNOSIS — R946 Abnormal results of thyroid function studies: Secondary | ICD-10-CM | POA: Diagnosis not present

## 2018-07-28 NOTE — Telephone Encounter (Signed)
Received fax from [place] requesting prior authorizations for Enbrel. Called [employer] and advised of information below. They will call plan to confirm.  3:55 PM Beatriz Chancellor, CPhT

## 2018-07-29 LAB — BASIC METABOLIC PANEL WITH GFR
BUN: 16 mg/dL (ref 7–25)
CALCIUM: 9.3 mg/dL (ref 8.6–10.4)
CO2: 24 mmol/L (ref 20–32)
Chloride: 103 mmol/L (ref 98–110)
Creat: 0.79 mg/dL (ref 0.60–0.93)
GFR, Est African American: 85 mL/min/{1.73_m2} (ref >=60)
GFR, Est Non African American: 74 mL/min/{1.73_m2} (ref >=60)
Glucose, Bld: 152 mg/dL — ABNORMAL HIGH (ref 65–99)
Potassium: 4 mmol/L (ref 3.5–5.3)
Sodium: 140 mmol/L (ref 135–146)

## 2018-07-29 LAB — MICROALBUMIN / CREATININE URINE RATIO
Creatinine, Urine: 114 mg/dL (ref 20–275)
MICROALB/CREAT RATIO: 83 ug/mg{creat} — AB (ref <30)
Microalb, Ur: 9.5 mg/dL

## 2018-07-31 NOTE — Addendum Note (Signed)
Addended by: Delsa Grana on: 07/31/2018 01:15 AM   Modules accepted: Orders

## 2018-08-01 LAB — TEST AUTHORIZATION

## 2018-08-01 LAB — MAGNESIUM: Magnesium: 1.1 mg/dL — ABNORMAL LOW (ref 1.5–2.5)

## 2018-08-01 LAB — T3, FREE: T3, Free: 3 pg/mL (ref 2.3–4.2)

## 2018-08-01 LAB — TSH: TSH: 4.98 mIU/L — ABNORMAL HIGH (ref 0.40–4.50)

## 2018-08-01 LAB — T4, FREE: Free T4: 0.8 ng/dL (ref 0.8–1.8)

## 2018-08-02 ENCOUNTER — Ambulatory Visit
Admission: RE | Admit: 2018-08-02 | Discharge: 2018-08-02 | Disposition: A | Discharge status: Home / Self Care | Payer: PPO | Source: Ambulatory Visit | Attending: Family Medicine | Admitting: Family Medicine

## 2018-08-02 DIAGNOSIS — E041 Nontoxic single thyroid nodule: Secondary | ICD-10-CM | POA: Diagnosis not present

## 2018-08-04 ENCOUNTER — Other Ambulatory Visit: Payer: Self-pay | Admitting: *Deleted

## 2018-08-04 MED ORDER — METFORMIN HCL 500 MG PO TABS: 500.0000 mg | ORAL_TABLET | Freq: Two times a day (BID) | ORAL | 1 refills | Status: DC

## 2018-08-04 MED ORDER — ATORVASTATIN CALCIUM 10 MG PO TABS: ORAL_TABLET | ORAL | 1 refills | Status: DC

## 2018-08-09 ENCOUNTER — Other Ambulatory Visit: Payer: Self-pay

## 2018-08-09 MED ORDER — CLONIDINE HCL 0.3 MG PO TABS: 0.3000 mg | ORAL_TABLET | Freq: Two times a day (BID) | ORAL | 0 refills | Status: DC

## 2018-08-17 NOTE — Telephone Encounter (Signed)
Called [employer] to check status of patient's application, was advised that they still need to contact patient's plan to confirm formulary for 2020.

## 2018-08-22 ENCOUNTER — Other Ambulatory Visit: Payer: Self-pay | Admitting: Rheumatology

## 2018-08-22 NOTE — Telephone Encounter (Signed)
Last Visit: 04/26/18 Next visit: 09/27/18 Labs: 07/13/18 stable  Okay to refill per Dr. Estanislado Pandy

## 2018-08-24 NOTE — Telephone Encounter (Signed)
Received notification from [place] regarding a prior authorization for Enbrel. Authorization has been APPROVED from 08/24/2018 to 08/24/2019.   Will send document to scan center and [employer] once received.  Authorization # [id number]

## 2018-08-28 NOTE — Telephone Encounter (Signed)
Received fax from [employer], application has been APPROVED. Coverage is from 08/28/2018 to 08/23/2019.  Will send document to scan Center.  Phone# [phone] Fax# [phone]

## 2018-08-29 ENCOUNTER — Other Ambulatory Visit: Payer: Self-pay | Admitting: *Deleted

## 2018-08-29 MED ORDER — MAGNESIUM OXIDE 400 MG PO TABS: ORAL_TABLET | ORAL | 3 refills | Status: DC

## 2018-09-04 ENCOUNTER — Other Ambulatory Visit: Payer: Self-pay | Admitting: *Deleted

## 2018-09-04 MED ORDER — LOSARTAN POTASSIUM-HCTZ 100-12.5 MG PO TABS: 1.0000 | ORAL_TABLET | Freq: Every day | ORAL | 0 refills | Status: DC

## 2018-09-27 ENCOUNTER — Encounter: Payer: Self-pay | Admitting: Physician Assistant

## 2018-09-27 ENCOUNTER — Ambulatory Visit: Payer: PPO | Admitting: Physician Assistant

## 2018-09-27 VITALS — BP 173/81 | HR 72 | Resp 15 | Ht <= 58 in | Wt 171.4 lb

## 2018-09-27 DIAGNOSIS — M7062 Trochanteric bursitis, left hip: Secondary | ICD-10-CM | POA: Diagnosis not present

## 2018-09-27 DIAGNOSIS — K760 Fatty (change of) liver, not elsewhere classified: Secondary | ICD-10-CM | POA: Diagnosis not present

## 2018-09-27 DIAGNOSIS — Z96653 Presence of artificial knee joint, bilateral: Secondary | ICD-10-CM | POA: Diagnosis not present

## 2018-09-27 DIAGNOSIS — M0579 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 without organ or systems involvement: Secondary | ICD-10-CM

## 2018-09-27 DIAGNOSIS — Z8719 Personal history of other diseases of the digestive system: Secondary | ICD-10-CM

## 2018-09-27 DIAGNOSIS — M19041 Primary osteoarthritis, right hand: Secondary | ICD-10-CM | POA: Diagnosis not present

## 2018-09-27 DIAGNOSIS — R918 Other nonspecific abnormal finding of lung field: Secondary | ICD-10-CM

## 2018-09-27 DIAGNOSIS — Z8679 Personal history of other diseases of the circulatory system: Secondary | ICD-10-CM | POA: Diagnosis not present

## 2018-09-27 DIAGNOSIS — Z79899 Other long term (current) drug therapy: Secondary | ICD-10-CM

## 2018-09-27 DIAGNOSIS — M85851 Other specified disorders of bone density and structure, right thigh: Secondary | ICD-10-CM | POA: Diagnosis not present

## 2018-09-27 DIAGNOSIS — Z8639 Personal history of other endocrine, nutritional and metabolic disease: Secondary | ICD-10-CM | POA: Diagnosis not present

## 2018-09-27 DIAGNOSIS — M19042 Primary osteoarthritis, left hand: Secondary | ICD-10-CM

## 2018-09-27 NOTE — Progress Notes (Signed)
Office Visit Note  Patient: [name]             Date of Birth: [birth year]           MRN: [id number]             PCP: Delsa Grana, PA-C Referring: Orlena Sheldon, PA-C Visit Date: 09/27/2018 Occupation: @GUAROCC @  Subjective:  Medication monitoring   History of Present Illness: [name] is a 73 y.o. female with history of seropositive rheumatoid arthritis and osteoarthritis.  She is on Enbrel 50 mg sq injections once weekly and Arava 10 mg po daily. She feels that this has been a good combination.  She denies any joint pain at this time.  She denies any joint swelling.  She reports both knee replacemens are doing well.  She denies any pain or swelling in hands or feet. She continues to have numbness of the right 4th and 5th toes that is mild and has not progressed.  She denies a known history of neuropathy. She does not have any concerns at this time.  She denies any recent infections.  She received the annual influenza vaccination.    Activities of Daily Living:  Patient reports morning stiffness for 5 minutes.   Patient Denies nocturnal pain.  Difficulty dressing/grooming: Denies Difficulty climbing stairs: Denies Difficulty getting out of chair: Denies Difficulty using hands for taps, buttons, cutlery, and/or writing: Reports  Review of Systems  Constitutional: Positive for fatigue.  HENT: Positive for mouth dryness. Negative for mouth sores and nose dryness.   Eyes: Negative for pain, itching, visual disturbance and dryness.  Respiratory: Negative for cough, hemoptysis, shortness of breath, wheezing and difficulty breathing.   Cardiovascular: Negative for chest pain, palpitations, hypertension and swelling in legs/feet.  Gastrointestinal: Positive for diarrhea. Negative for abdominal pain, blood in stool and constipation.  Endocrine: Negative for increased urination.  Genitourinary: Negative for painful urination, nocturia and pelvic pain.  Musculoskeletal: Positive for  arthralgias, joint pain, joint swelling and morning stiffness. Negative for myalgias, muscle weakness, muscle tenderness and myalgias.  Skin: Negative for color change, pallor, rash, hair loss, nodules/bumps, redness, skin tightness, ulcers and sensitivity to sunlight.  Allergic/Immunologic: Negative for susceptible to infections.  Neurological: Negative for dizziness, light-headedness, numbness, headaches and memory loss.  Hematological: Negative for swollen glands.  Psychiatric/Behavioral: Negative for depressed mood, confusion and sleep disturbance. The patient is not nervous/anxious.     PMFS History:  Patient Active Problem List   Diagnosis Date Noted  . SIRS (systemic inflammatory response syndrome) ([place]) 07/11/2018  . Hypokalemia 07/11/2018  . Nausea & vomiting 07/11/2018  . Hypertensive urgency 07/11/2018  . Diabetes mellitus type 2, uncomplicated ([place]) 36/64/4034  . History of total bilateral knee replacement 06/22/2017  . History of hypertension 08/20/2016  . High risk medication use 08/20/2016  . Primary osteoarthritis of both hands 08/20/2016  . Hypomagnesemia 07/21/2016  . Thyroid nodule 03/05/2016  . Diabetic eye exam ([place]) 10/15/2014  . Multiple pulmonary nodules 04/04/2014  . Malnutrition of moderate degree ([place]) 03/18/2014  . Sepsis ([place]) 03/16/2014  . Anemia 11/21/2013  . 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 Bursitis of right shoulder   . GERD (gastroesophageal reflux disease)   . Morbid obesity due to excess calories ([place]) 12/27/2012  . 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 without organ or systems involvement ([place])   . Essential hypertension, benign 10/14/2012  . Hyperlipidemia   . Hormone replacement therapy (postmenopausal)   . Fatty liver disease, nonalcoholic   .  Osteoarthritis of right knee 08/05/2012    Past Medical History:  Diagnosis Date  . Arthritis    osteoarthritis  . Bursitis of right shoulder   . Chronic cholecystitis with  calculus 05/04/2013  . 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 Diabetes type 2, controlled ([place]) 05/04/2013  . Fatty liver disease, nonalcoholic 6644   hospitalized, MRCP dx fatty liver  . GERD (gastroesophageal reflux disease)   . Hormone replacement therapy (postmenopausal)   . Hyperlipidemia   . Hypertension   . Normal cardiac stress test    pt can't remember when or where  . Obesity, unspecified 12/27/2012  . Rheumatoid arthritis(714.0) 09/28/2012  . 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 Unspecified essential hypertension 05/04/2013    Family History  Problem Relation Age of Onset  . Cancer Mother        breast  . Heart attack Mother   . Stroke Father   . Diabetes Sister   . Diabetes Daughter    Past Surgical History:  Procedure Laterality Date  . ABDOMINAL HYSTERECTOMY    . CHOLECYSTECTOMY N/A 05/04/2013   Procedure: LAPAROSCOPIC CHOLECYSTECTOMY WITH INTRAOPERATIVE CHOLANGIOGRAM;  Surgeon: Haywood Lasso, MD;  Location: [facility];  Service: General;  Laterality: N/A;  . ERCP N/A 05/02/2013   Procedure: ENDOSCOPIC RETROGRADE CHOLANGIOPANCREATOGRAPHY (ERCP);  Surgeon: Beryle Beams, MD;  Location: [facility];  Service: Endoscopy;  Laterality: N/A;  . JOINT REPLACEMENT     left knee  . NASAL SINUS SURGERY    . SPHINCTEROTOMY  05/02/2013   Procedure: SPHINCTEROTOMY;  Surgeon: Beryle Beams, MD;  Location: [facility];  Service: Endoscopy;;  . TONSILLECTOMY    . TOTAL KNEE ARTHROPLASTY  08/01/2012   Procedure: TOTAL KNEE ARTHROPLASTY;  Surgeon: Meredith Pel, MD;  Location: [facility];  Service: Orthopedics;  Laterality: Right;  Right total knee arthroplasty  . TOTAL KNEE ARTHROPLASTY  08/01/2012   RIGHT  KNEE   Social History   Social History Narrative  . Not on file   Immunization History  Administered Date(s) Administered  . Influenza Split 05/23/2012  . Influenza, High Dose Seasonal PF 07/13/2018  . Influenza,inj,Quad  PF,6+ Mos 06/16/2017  . Influenza-Unspecified 05/23/2013, 06/06/2014  . PPD Test 06/02/2016  . Pneumococcal Conjugate-13 07/04/2013  . Pneumococcal Polysaccharide-23 06/23/2011, 08/02/2012  . Tdap 06/23/2011, 05/23/2014  . Zoster 09/24/2011     Objective: Vital Signs: BP (!) 173/81 (BP Location: Left Wrist, Patient Position: Sitting, Cuff Size: Normal)   Pulse 72   Resp 15   Ht 4\' 10"  (1.473 m)   Wt 171 lb 6.4 oz (77.7 kg)   BMI 35.82 kg/m    Physical Exam Vitals signs and nursing note reviewed.  Constitutional:      Appearance: She is well-developed.  HENT:     Head: Normocephalic and atraumatic.  Eyes:     Conjunctiva/sclera: Conjunctivae normal.  Neck:     Musculoskeletal: Normal range of motion.  Cardiovascular:     Rate and Rhythm: Normal rate and regular rhythm.     Heart sounds: Normal heart sounds.  Pulmonary:     Effort: Pulmonary effort is normal.     Breath sounds: Normal breath sounds.  Abdominal:     General: Bowel sounds are normal.     Palpations: Abdomen is soft.  Lymphadenopathy:     Cervical: No cervical adenopathy.  Skin:    General: Skin is warm and dry.  Capillary Refill: Capillary refill takes less than 2 seconds.  Neurological:     Mental Status: She is alert and oriented to person, place, and time.  Psychiatric:        Behavior: Behavior normal.      Musculoskeletal Exam: C-spine good ROM.  Mild thoracic kyphosis.  No midline spinal tenderness.  No SI joint tenderness. Shoulder joints, elbow joints, wrist joints, MCPs, PIPs, and DIPs good ROM with no synovitis.  Complete fist formation bilaterally.  PIP and DIP synovial thickening.  Synovial thickening of 1st, 2nd, and 3rd MCP joints bilaterally. Synovial thickening of both CMC joints. No tenderness or synovitis on exam. Hip joints, knee joints, ankle joints, MTPs, PIPs, and DIPs good ROM with no synovitis.  No warmth or effusion of knee joint replacements. No tenderness or swelling of ankle  joints. No tenderness of MTP joints.  Pedal edema noted bilaterally.    CDAI Exam: CDAI Score: 0.6  Patient Global Assessment: 3 (mm); Provider Global Assessment: 3 (mm) Swollen: 0 ; Tender: 0  Joint Exam   Not documented   There is currently no information documented on the homunculus. Go to the Rheumatology activity and complete the homunculus joint exam.  Investigation: No additional findings.  Imaging: No results found.  Recent Labs: Lab Results  Component Value Date   WBC 6.7 07/13/2018   HGB 12.3 07/13/2018   PLT 168 07/13/2018   NA 140 07/27/2018   K 4.0 07/27/2018   CL 103 07/27/2018   CO2 24 07/27/2018   GLUCOSE 152 (H) 07/27/2018   BUN 16 07/27/2018   CREATININE 0.79 07/27/2018   BILITOT 0.6 07/13/2018   ALKPHOS 76 07/13/2018   AST 24 07/13/2018   ALT 16 07/13/2018   PROT 5.3 (L) 07/13/2018   ALBUMIN 2.4 (L) 07/13/2018   CALCIUM 9.3 07/27/2018   GFRAA 85 07/27/2018   QFTBGOLD NEGATIVE 05/17/2017   QFTBGOLDPLUS NEGATIVE 04/21/2018    Speciality Comments: No specialty comments available.  Procedures:  No procedures performed Allergies: Monopril [fosinopril] and Norvasc [amlodipine besylate]   Assessment / Plan:     Visit Diagnoses: 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 without organ or systems involvement ([place]) - +RF, +CCP, -ANA: She has no synovitis on exam.  She has not had any recent rheumatoid arthritis flares.  She has synovial thickening of bilateral 1st, 2nd, and 3rd MCP joints but no tenderness or synovitis noted.  She is clinically doing well on Enbrel 50 mg sq injections every week and Arava 10 mg po daily.  She has no joint pain or joint swelling at this time. She has not had any recent infections and has received the annual influenza vaccination.  She will continue on this current treatment regimen.  She does not need any refills at this time.  She was advised to notify us if she develops increased joint pain or joint  swelling.  She will follow up in 5 months.   High risk medication use -  Enbrel 50 mg subcutaneous every week(06/25/2016), Arava 10 mg by mouth daily.  She will be due for CBC and CMP in March and every 3 months.  TB gold negative 04/21/18.  Standing orders are in place.  She has not had any recent infections.  She received the annual influenza vaccination.  She was reminded to schedule yearly skin exams while on Enbrel.    Primary osteoarthritis of both hands: She has PIP and DIP synovial thickening consistent with osteoarthritis.  She has [place]  joint synovial thickening.  She has no tenderness or synovitis.  She has complete fist formation bilaterally.  Joint protection and muscle strengthening were discussed.   History of total bilateral knee replacement - Right= 2013. Left=2003: Doing well.  No warmth or effusion.  She has good ROM with no discomfort.   Other medical conditions are listed as follows:   History of diabetes mellitus  History of hypertension  History of hyperlipidemia  Multiple pulmonary nodules  History of gastroesophageal reflux (GERD)  Greater trochanteric bursitis of left hip: resolved   Fatty liver disease, nonalcoholic  Osteopenia of right hip   Orders: No orders of the defined types were placed in this encounter.  No orders of the defined types were placed in this encounter.    Follow-Up Instructions: Return in about 5 months (around 02/25/2019) for Rheumatoid arthritis, Osteoarthritis.   Ofilia Neas, PA-C  Note - This record has been created using Dragon software.  Chart creation errors have been sought, but may not always  have been located. Such creation errors do not reflect on  the standard of medical care.

## 2018-09-27 NOTE — Patient Instructions (Signed)
Standing Labs We placed an order today for your standing lab work.    Please come back and get your standing labs in March and every 3 months  We have open lab Monday through Friday from 8:30-11:30 AM and 1:30-4:00 PM  at the office of Dr. Shaili Deveshwar.   You may experience shorter wait times on Monday and Friday afternoons. The office is located at [address], [address], [place], [place] [address] No appointment is necessary.   Labs are drawn by [facility].  You may receive a bill from [facility] for your lab work.  If you wish to have your labs drawn at another location, please call the office 24 hours in advance to send orders.  If you have any questions regarding directions or hours of operation,  please call [phone].   Just as a reminder please drink plenty of water prior to coming for your lab work. Thanks!  

## 2018-10-25 ENCOUNTER — Other Ambulatory Visit: Payer: Self-pay | Admitting: Family Medicine

## 2018-10-25 MED ORDER — METOPROLOL TARTRATE 25 MG PO TABS: 12.5000 mg | ORAL_TABLET | Freq: Two times a day (BID) | ORAL | 3 refills | Status: DC

## 2018-11-15 ENCOUNTER — Other Ambulatory Visit: Payer: Self-pay | Admitting: Rheumatology

## 2018-11-15 NOTE — Telephone Encounter (Addendum)
Last Visit: 09/27/18 Next Visit: 02/28/19 Labs: 07/13/18 glucose 106, BUN 29, Calcium 7.8 Total Protein 5.3 Albumin 2.4  Okay to refill per Dr. Estanislado Pandy

## 2018-11-27 ENCOUNTER — Other Ambulatory Visit: Payer: Self-pay | Admitting: *Deleted

## 2018-11-27 DIAGNOSIS — Z79899 Other long term (current) drug therapy: Secondary | ICD-10-CM | POA: Diagnosis not present

## 2018-11-27 DIAGNOSIS — H903 Sensorineural hearing loss, bilateral: Secondary | ICD-10-CM | POA: Diagnosis not present

## 2018-11-27 DIAGNOSIS — H6123 Impacted cerumen, bilateral: Secondary | ICD-10-CM | POA: Diagnosis not present

## 2018-11-27 DIAGNOSIS — H9313 Tinnitus, bilateral: Secondary | ICD-10-CM | POA: Diagnosis not present

## 2018-11-27 DIAGNOSIS — M545 Low back pain: Secondary | ICD-10-CM | POA: Diagnosis not present

## 2018-11-27 DIAGNOSIS — G8929 Other chronic pain: Secondary | ICD-10-CM | POA: Diagnosis not present

## 2018-11-28 LAB — CBC WITH DIFFERENTIAL/PLATELET
Absolute Monocytes: 819 cells/uL (ref 200–950)
Basophils Absolute: 32 cells/uL (ref 0–200)
Basophils Relative: 0.5 %
Eosinophils Absolute: 290 cells/uL (ref 15–500)
Eosinophils Relative: 4.6 %
HCT: 44.3 % (ref 35.0–45.0)
Hemoglobin: 14.9 g/dL (ref 11.7–15.5)
Lymphs Abs: 2337 cells/uL (ref 850–3900)
MCH: 31.2 pg (ref 27.0–33.0)
MCHC: 33.6 g/dL (ref 32.0–36.0)
MCV: 92.9 fL (ref 80.0–100.0)
MPV: 12.4 fL (ref 7.5–12.5)
Monocytes Relative: 13 %
Neutro Abs: 2822 cells/uL (ref 1500–7800)
Neutrophils Relative %: 44.8 %
Platelets: 181 10*3/uL (ref 140–400)
RBC: 4.77 10*6/uL (ref 3.80–5.10)
RDW: 12.6 % (ref 11.0–15.0)
Total Lymphocyte: 37.1 %
WBC: 6.3 10*3/uL (ref 3.8–10.8)

## 2018-11-28 LAB — COMPLETE METABOLIC PANEL WITH GFR
AG Ratio: 1.5 (calc) (ref 1.0–2.5)
ALT: 20 U/L (ref 6–29)
AST: 27 U/L (ref 10–35)
Albumin: 4.2 g/dL (ref 3.6–5.1)
Alkaline phosphatase (APISO): 127 U/L (ref 37–153)
BUN/Creatinine Ratio: 18 (calc) (ref 6–22)
BUN: 17 mg/dL (ref 7–25)
CO2: 26 mmol/L (ref 20–32)
Calcium: 10 mg/dL (ref 8.6–10.4)
Chloride: 102 mmol/L (ref 98–110)
Creat: 0.95 mg/dL — ABNORMAL HIGH (ref 0.60–0.93)
GFR, Est African American: 68 mL/min/{1.73_m2} (ref >=60)
GFR, Est Non African American: 59 mL/min/{1.73_m2} — ABNORMAL LOW (ref >=60)
Globulin: 2.8 g/dL (calc) (ref 1.9–3.7)
Glucose, Bld: 88 mg/dL (ref 65–99)
Potassium: 4 mmol/L (ref 3.5–5.3)
Sodium: 141 mmol/L (ref 135–146)
Total Bilirubin: 0.4 mg/dL (ref 0.2–1.2)
Total Protein: 7 g/dL (ref 6.1–8.1)

## 2018-11-28 NOTE — Progress Notes (Signed)
Creatinine is mildly elevated.  It is most likely due to use of HCTZ

## 2018-12-05 ENCOUNTER — Other Ambulatory Visit: Payer: Self-pay | Admitting: Family Medicine

## 2018-12-06 ENCOUNTER — Other Ambulatory Visit: Payer: Self-pay

## 2018-12-06 MED ORDER — FOLIC ACID 1 MG PO TABS: 1.0000 mg | ORAL_TABLET | Freq: Two times a day (BID) | ORAL | 3 refills | Status: DC

## 2019-01-08 ENCOUNTER — Other Ambulatory Visit: Payer: Self-pay | Admitting: Family Medicine

## 2019-01-26 ENCOUNTER — Ambulatory Visit: Payer: PPO | Admitting: Family Medicine

## 2019-01-30 ENCOUNTER — Encounter: Payer: Self-pay | Admitting: Family Medicine

## 2019-01-30 ENCOUNTER — Ambulatory Visit (INDEPENDENT_AMBULATORY_CARE_PROVIDER_SITE_OTHER): Payer: PPO | Admitting: Family Medicine

## 2019-01-30 ENCOUNTER — Other Ambulatory Visit: Payer: Self-pay

## 2019-01-30 DIAGNOSIS — E785 Hyperlipidemia, unspecified: Secondary | ICD-10-CM

## 2019-01-30 DIAGNOSIS — R946 Abnormal results of thyroid function studies: Secondary | ICD-10-CM | POA: Diagnosis not present

## 2019-01-30 DIAGNOSIS — R197 Diarrhea, unspecified: Secondary | ICD-10-CM

## 2019-01-30 DIAGNOSIS — E119 Type 2 diabetes mellitus without complications: Secondary | ICD-10-CM

## 2019-01-30 DIAGNOSIS — I10 Essential (primary) hypertension: Secondary | ICD-10-CM | POA: Diagnosis not present

## 2019-01-30 NOTE — Patient Instructions (Signed)
Slow release Magnesium, dose daily to 800 in divided doses  I will call you with labs

## 2019-01-30 NOTE — Progress Notes (Signed)
0    Patient ID: [name], female    DOB: [birth year], 73 y.o.   MRN: [id number]  PCP: Delsa Grana, PA-C  Chief Complaint  Patient presents with   Hypertension    Patient also has concerns of Diarrhea. She is fasting     Subjective:   [name] is a 73 y.o. female, presents to clinic with CC of routine med f/up for DM, HLD, and HTN.  She also wants to discuss daily dirrhea. Diarrhea, severe, constant, daily, just eating makes her go to the bathroom in a few minutes.  Very loose stool to sometimes watery.  "all her life shes had diarrhea, but for the last couple of months it has progressively gotten worse" she is not going anywhere because of the worsening diarrhea, at least 3-4 BM per day.  Stomach rumbles and is loud some bloating, no pain. Taking store brand immodium daily, only helps a little bit.  The severity and urgency of her diarrhea as well as the frequency has made it so that she tends to stay at home much more than she would like to. Per chart review patient did present to the same clinic last year around December for a hospitalization follow-up appointment.  At that time her magnesium was very low and she was advised to increase her supplementation and to use a delayed release magnesium and also to follow-up with a repeat magnesium lab in 1 week.  She never did return for her repeat lab work and has been taking high-dose magnesium ever since.  She additionally reports losing weight gradually since December.  She is compliant with Norvasc, Lipitor, losartan hydrochlorothiazide.  She also continues to do iron supplementation folic acid, calcium, vitamin D and B vitamin. She does not monitor her blood pressure at home, she denies any lightheaded episodes or adverse side effects, denies myalgias.  She is also continuing to take metformin once in the morning, she does not believe her stomach is affected by this because she has had no difference with her bowel movements since  decreasing the dose from twice daily to once a day in the morning, she also held the medication for a short while because of hearing recall is on metformin on the news that it may cause cancer.  While she has held the metformin she also has had no difference to her bowel movements  She was previously seeing Karis Juba PA-C as her PCP, I have seen her a few times for acute issues and hospital follow up, but this is the first time seeing her for routine follow-up and medical management.  Per chart review it appears that about a year ago in 2019 her magnesium level was low so supplementation was started by her PCP.    Patient Active Problem List   Diagnosis Alexander Noted   SIRS (systemic inflammatory response syndrome) ([place]) 07/11/2018   Hypokalemia 07/11/2018   Nausea & vomiting 07/11/2018   Hypertensive urgency 07/11/2018   Diabetes mellitus type 2, uncomplicated ([place]) 96/22/2979   History of total bilateral knee replacement 06/22/2017   History of hypertension 08/20/2016   High risk medication use 08/20/2016   Primary osteoarthritis of both hands 08/20/2016   Hypomagnesemia 07/21/2016   Thyroid nodule 03/05/2016   Diabetic eye exam ([place]) 10/15/2014   Multiple pulmonary nodules 04/04/2014   Malnutrition of moderate degree ([place]) 03/18/2014   Sepsis ([place]) 03/16/2014   Anemia 11/21/2013   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 Authorizing Provider  amLODipine  (NORVASC) 5 MG tablet TAKE 1 TABLET BY MOUTH EVERY DAY 01/08/19  Yes Susy Frizzle, MD  aspirin EC 81 MG tablet Take 81 mg by mouth daily.   Yes [provider]  atorvastatin (LIPITOR) 10 MG tablet TAKE 1 TABLET DAILY 08/04/18  Yes [place], Modena Nunnery, MD  Calcium Carbonate-Vitamin D (CALCIUM 600 + D PO) Take 1 tablet by mouth 2 (two) times daily.   Yes [provider]  cloNIDine (CATAPRES) 0.3 MG tablet Take 1 tablet (0.3 mg total) by mouth 2 (two) times daily. 08/09/18  Yes Delsa Grana, PA-C  cyanocobalamin 500 MCG tablet Take 1 tablet (500 mcg total) by mouth daily. 03/20/14  Yes Caren Griffins, MD  diclofenac sodium (VOLTAREN) 1 % GEL Apply 3 g topically daily as needed (pain). To 3 large joints   Yes [provider]  estradiol (ESTRACE) 0.5 MG tablet Take 0.5 mg by mouth 2 (two) times daily.   Yes [provider]  etanercept (ENBREL SURECLICK) 50 MG/ML injection INJECT 50 MG (0.98 ML) UNDER THE SKIN ONCE A WEEK 07/25/18  Yes Deveshwar, Abel Presto, MD  Ferrous Sulfate (IRON) 325 (65 Fe) MG TABS Take 1 tablet by mouth daily.    Yes [provider]  fish oil-omega-3 fatty acids 1000 MG capsule Take 1 g by mouth at bedtime.    Yes [provider]  folic acid (FOLVITE) 1 MG tablet Take 1 tablet (1 mg total) by mouth 2 (two) times daily. 12/06/18  Yes Delsa Grana, PA-C  leflunomide (ARAVA) 10 MG tablet TAKE 1 TABLET BY MOUTH EVERY DAY 11/15/18  Yes Deveshwar, Abel Presto, MD  losartan-hydrochlorothiazide (HYZAAR) 100-12.5 MG tablet TAKE 1 TABLET BY MOUTH EVERY DAY 12/05/18  Yes Delsa Grana, PA-C  magnesium oxide (MAG-OX) 400 MG tablet Take 2 tablets 400mg  (total 800mg ) in the morning, and 400mg  in the evening 08/29/18  Yes Delsa Grana, PA-C  metFORMIN (GLUCOPHAGE) 500 MG tablet Take 1 tablet (500 mg total) by mouth 2 (two) times daily with a meal. 08/04/18  Yes [place], Modena Nunnery, MD  metoprolol tartrate (LOPRESSOR) 25 MG tablet Take 0.5 tablets (12.5 mg  total) by mouth 2 (two) times daily. 10/25/18  Yes Delsa Grana, PA-C  Multiple Vitamins-Minerals (MULTIVITAMINS THER. W/MINERALS) TABS Take 1 tablet by mouth daily.   Yes [provider]  omeprazole (PRILOSEC) 20 MG capsule TAKE 1 CAPSULE BY MOUTH EVERY DAY 01/09/18  Yes Dena Billet B, PA-C     Allergies  Allergen Reactions   Monopril [Fosinopril] Cough   Norvasc [Amlodipine Besylate] Other (See Comments)    10 mg dose causes LE edema.  Tolerates 5mg  dose.     Family History  Problem Relation Age of Onset   Cancer Mother        breast   Heart attack Mother    Stroke Father    Diabetes Sister    Diabetes Daughter      Social History   Socioeconomic History   Marital status: Divorced  Spouse name: Not on file   Number of children: Not on file   Years of education: Not on file   Highest education level: Not on file  Occupational History   Not on file  Social Needs   Financial resource strain: Not on file   Food insecurity:    Worry: Not on file    Inability: Not on file   Transportation needs:    Medical: Not on file    Non-medical: Not on file  Tobacco Use   Smoking status: Former Smoker    Packs/day: 0.25    Years: 2.00    Pack years: 0.50    Types: Cigarettes   Smokeless tobacco: Never Used  Substance and Sexual Activity   Alcohol use: Yes    Comment: occasional   Drug use: Never   Sexual activity: Never  Lifestyle   Physical activity:    Days per week: Not on file    Minutes per session: Not on file   Stress: Not on file  Relationships   Social connections:    Talks on phone: Not on file    Gets together: Not on file    Attends religious service: Not on file    Active member of club or organization: Not on file    Attends meetings of clubs or organizations: Not on file    Relationship status: Not on file   Intimate partner violence:    Fear of current or ex partner: Not on file    Emotionally abused: Not on file      Physically abused: Not on file    Forced sexual activity: Not on file  Other Topics Concern   Not on file  Social History Narrative   Not on file     Review of Systems  Constitutional: Negative.   HENT: Negative.   Eyes: Negative.   Respiratory: Negative.   Cardiovascular: Negative.   Gastrointestinal: Negative.   Endocrine: Negative.   Genitourinary: Negative.   Musculoskeletal: Negative.   Skin: Negative.   Allergic/Immunologic: Negative.   Neurological: Negative.   Hematological: Negative.   Psychiatric/Behavioral: Negative.   All other systems reviewed and are negative.      Objective:    Vitals:   01/30/19 0932  BP: 124/84  Pulse: 87  Resp: 15  Temp: 98.7 F (37.1 C)  TempSrc: Oral  SpO2: 96%  Weight: 161 lb 8 oz (73.3 kg)  Height: 4\' 10"  (1.473 m)    Body mass index is 33.75 kg/m.   Physical Exam Vitals signs and nursing note reviewed.  Constitutional:      General: She is not in acute distress.    Appearance: Normal appearance. She is well-developed. She is obese. She is not toxic-appearing or diaphoretic.  HENT:     Head: Normocephalic and atraumatic.     Right Ear: External ear normal.     Left Ear: External ear normal.     Nose: Nose normal.     Mouth/Throat:     Pharynx: Uvula midline.  Eyes:     General: Lids are normal.     Conjunctiva/sclera: Conjunctivae normal.     Pupils: Pupils are equal, round, and reactive to light.  Neck:     Musculoskeletal: Normal range of motion and neck supple.     Trachea: Phonation normal. No tracheal deviation.  Cardiovascular:     Rate and Rhythm: Normal rate and regular rhythm.     Pulses: Normal pulses.  Radial pulses are 2+ on the right side and 2+ on the left side.       Posterior tibial pulses are 2+ on the right side and 2+ on the left side.     Heart sounds: Normal heart sounds. No murmur. No friction rub. No gallop.   Pulmonary:     Effort: Pulmonary effort is normal. No  respiratory distress.     Breath sounds: Normal breath sounds. No stridor. No wheezing, rhonchi or rales.  Chest:     Chest wall: No tenderness.  Abdominal:     General: Bowel sounds are normal. There is no distension.     Palpations: Abdomen is soft.     Tenderness: There is no abdominal tenderness. There is no guarding or rebound.  Musculoskeletal: Normal range of motion.        General: No deformity.  Lymphadenopathy:     Cervical: No cervical adenopathy.  Skin:    General: Skin is warm and dry.     Capillary Refill: Capillary refill takes less than 2 seconds.     Coloration: Skin is pale.     Findings: No rash.  Neurological:     Mental Status: She is alert and oriented to person, place, and time.     Motor: No abnormal muscle tone.     Gait: Gait normal.  Psychiatric:        Speech: Speech normal.        Behavior: Behavior normal.           Assessment & Plan:   Routine f/up for HTN, HLD, DM.  Also complains of severe daily diarrhea.  Need to f/up on low mag - supplementation at high doses may be causing diarrhea. Also elevated TSH in the past with normal T4 and T3 - will recheck.   Problem List Items Addressed This Visit      Cardiovascular and Mediastinum   Essential hypertension, benign    She is compliant with medications, not monitoring at home, no adverse side effects, no syncopal episodes or near syncope Blood pressure at goal today We will recheck renal function and electrolytes      Relevant Orders   COMPLETE METABOLIC PANEL WITH GFR (Completed)     Endocrine   Diabetes mellitus type 2, uncomplicated (HCC)    Taking metformin 500 mg once daily in the morning, no side effects she denies abdominal pain or diarrhea being related to taking her metformin She would like to get off of metformin if she could she is losing weight. Recheck A1c and CMP       Relevant Orders   COMPLETE METABOLIC PANEL WITH GFR (Completed)   Hemoglobin A1c     Other    Hyperlipidemia    Compliant with statin, no adverse side effects or myalgias Recheck fasting lipid panel and CMP      Relevant Orders   Lipid panel (Completed)   COMPLETE METABOLIC PANEL WITH GFR (Completed)   Hypomagnesemia - Primary    Still supplementing with high doses of magnesium daily, was not compliant with previous instructions Instructed to decrease her magnesium supplement in half - 400 mg BID, get slow release mag from pharmacy - hope diarrhea will improve Recheck mag      Relevant Medications   magnesium oxide (MAG-OX) 400 MG tablet   Other Relevant Orders   Lipid panel (Completed)   Lipase (Completed)   Magnesium (Completed)   COMPLETE METABOLIC PANEL WITH GFR (Completed)   CBC with Differential/Platelet (  Completed)   Magnesium    Other Visit Diagnoses    Abnormal thyroid function test       recheck TSH, T4 and T3   Relevant Orders   TSH (Completed)   T3, free (Completed)   T4, free (Completed)   Diarrhea, unspecified type       daily x 6 months, refuses DRE/hemoccult, check for blood with home test, decrease mag, check CMP/CBC r/o blood loss or anemia   Relevant Medications   magnesium oxide (MAG-OX) 400 MG tablet   Other Relevant Orders   TSH (Completed)   Lipid panel (Completed)   Lipase (Completed)   Magnesium (Completed)   COMPLETE METABOLIC PANEL WITH GFR (Completed)   CBC with Differential/Platelet (Completed)   T3, free (Completed)   T4, free (Completed)   Ambulatory referral to Gastroenterology   Fecal Globin By Immunochemistry      DM - need eye exam/foot exam, on ARB and statin Will do other care when pt returns and has less acute complaints - did not have time to do it all today, will need to review chart from last PCP to see when last done as well (usually charted in note and not in care gap/EMR).    Delsa Grana, PA-C 01/30/19 9:54 AM

## 2019-01-31 LAB — CBC WITH DIFFERENTIAL/PLATELET
Absolute Monocytes: 798 cells/uL (ref 200–950)
Basophils Absolute: 30 cells/uL (ref 0–200)
Basophils Relative: 0.5 %
Eosinophils Absolute: 288 cells/uL (ref 15–500)
Eosinophils Relative: 4.8 %
HCT: 39.2 % (ref 35.0–45.0)
Hemoglobin: 13 g/dL (ref 11.7–15.5)
Lymphs Abs: 2208 cells/uL (ref 850–3900)
MCH: 31.2 pg (ref 27.0–33.0)
MCHC: 33.2 g/dL (ref 32.0–36.0)
MCV: 94 fL (ref 80.0–100.0)
MPV: 12.4 fL (ref 7.5–12.5)
Monocytes Relative: 13.3 %
Neutro Abs: 2676 cells/uL (ref 1500–7800)
Neutrophils Relative %: 44.6 %
Platelets: 156 10*3/uL (ref 140–400)
RBC: 4.17 10*6/uL (ref 3.80–5.10)
RDW: 13 % (ref 11.0–15.0)
Total Lymphocyte: 36.8 %
WBC: 6 10*3/uL (ref 3.8–10.8)

## 2019-01-31 LAB — COMPLETE METABOLIC PANEL WITH GFR
AG Ratio: 1.4 (calc) (ref 1.0–2.5)
ALT: 29 U/L (ref 6–29)
AST: 30 U/L (ref 10–35)
Albumin: 3.8 g/dL (ref 3.6–5.1)
Alkaline phosphatase (APISO): 143 U/L (ref 37–153)
BUN/Creatinine Ratio: 20 (calc) (ref 6–22)
BUN: 20 mg/dL (ref 7–25)
CO2: 28 mmol/L (ref 20–32)
Calcium: 10 mg/dL (ref 8.6–10.4)
Chloride: 102 mmol/L (ref 98–110)
Creat: 0.98 mg/dL — ABNORMAL HIGH (ref 0.60–0.93)
GFR, Est African American: 66 mL/min/{1.73_m2} (ref >=60)
GFR, Est Non African American: 57 mL/min/{1.73_m2} — ABNORMAL LOW (ref >=60)
Globulin: 2.7 g/dL (calc) (ref 1.9–3.7)
Glucose, Bld: 118 mg/dL — ABNORMAL HIGH (ref 65–99)
Potassium: 3.7 mmol/L (ref 3.5–5.3)
Sodium: 140 mmol/L (ref 135–146)
Total Bilirubin: 0.6 mg/dL (ref 0.2–1.2)
Total Protein: 6.5 g/dL (ref 6.1–8.1)

## 2019-01-31 LAB — LIPID PANEL
Cholesterol: 155 mg/dL (ref <200)
HDL: 59 mg/dL (ref >=50)
LDL Cholesterol (Calc): 71 mg/dL (calc)
Non-HDL Cholesterol (Calc): 96 mg/dL (calc) (ref <130)
Total CHOL/HDL Ratio: 2.6 (calc) (ref <5.0)
Triglycerides: 173 mg/dL — ABNORMAL HIGH (ref <150)

## 2019-01-31 LAB — T4, FREE: Free T4: 0.9 ng/dL (ref 0.8–1.8)

## 2019-01-31 LAB — MAGNESIUM: Magnesium: 1.7 mg/dL (ref 1.5–2.5)

## 2019-01-31 LAB — TSH: TSH: 6.75 mIU/L — ABNORMAL HIGH (ref 0.40–4.50)

## 2019-01-31 LAB — LIPASE: Lipase: 6 U/L — ABNORMAL LOW (ref 7–60)

## 2019-01-31 LAB — T3, FREE: T3, Free: 3.3 pg/mL (ref 2.3–4.2)

## 2019-02-02 ENCOUNTER — Encounter: Payer: Self-pay | Admitting: Gastroenterology

## 2019-02-02 ENCOUNTER — Other Ambulatory Visit: Payer: Self-pay | Admitting: Family Medicine

## 2019-02-05 ENCOUNTER — Other Ambulatory Visit: Payer: Self-pay | Admitting: Family Medicine

## 2019-02-06 ENCOUNTER — Encounter: Payer: Self-pay | Admitting: Family Medicine

## 2019-02-06 MED ORDER — MAGNESIUM OXIDE 400 MG PO TABS: 400.0000 mg | ORAL_TABLET | Freq: Two times a day (BID) | ORAL | 1 refills | Status: DC

## 2019-02-06 NOTE — Assessment & Plan Note (Signed)
She is compliant with medications, not monitoring at home, no adverse side effects, no syncopal episodes or near syncope Blood pressure at goal today We will recheck renal function and electrolytes

## 2019-02-06 NOTE — Assessment & Plan Note (Signed)
Compliant with statin, no adverse side effects or myalgias Recheck fasting lipid panel and CMP

## 2019-02-06 NOTE — Assessment & Plan Note (Signed)
Taking metformin 500 mg once daily in the morning, no side effects she denies abdominal pain or diarrhea being related to taking her metformin She would like to get off of metformin if she could she is losing weight. Recheck A1c and CMP

## 2019-02-06 NOTE — Assessment & Plan Note (Signed)
Still supplementing with high doses of magnesium daily, was not compliant with previous instructions Instructed to decrease her magnesium supplement in half - 400 mg BID, get slow release mag from pharmacy - hope diarrhea will improve Recheck mag

## 2019-02-07 ENCOUNTER — Other Ambulatory Visit: Payer: Self-pay

## 2019-02-07 ENCOUNTER — Other Ambulatory Visit: Payer: PPO

## 2019-02-07 DIAGNOSIS — E119 Type 2 diabetes mellitus without complications: Secondary | ICD-10-CM

## 2019-02-12 ENCOUNTER — Other Ambulatory Visit: Payer: Self-pay | Admitting: Rheumatology

## 2019-02-12 NOTE — Telephone Encounter (Signed)
Last Visit: 09/27/18 Next Visit: 02/28/19 Labs: 01/30/19 glucose 118, Creat 0.98 GFR 57  Okay to refill Arava?

## 2019-02-12 NOTE — Telephone Encounter (Signed)
ok 

## 2019-02-13 ENCOUNTER — Other Ambulatory Visit: Payer: Self-pay | Admitting: Family Medicine

## 2019-02-13 DIAGNOSIS — R197 Diarrhea, unspecified: Secondary | ICD-10-CM

## 2019-02-14 ENCOUNTER — Other Ambulatory Visit: Payer: Self-pay

## 2019-02-14 ENCOUNTER — Other Ambulatory Visit: Payer: Self-pay | Admitting: Family Medicine

## 2019-02-14 ENCOUNTER — Other Ambulatory Visit: Payer: PPO

## 2019-02-14 DIAGNOSIS — R5383 Other fatigue: Secondary | ICD-10-CM

## 2019-02-14 DIAGNOSIS — R946 Abnormal results of thyroid function studies: Secondary | ICD-10-CM

## 2019-02-14 MED ORDER — LEVOTHYROXINE SODIUM 25 MCG PO TABS: ORAL_TABLET | ORAL | 0 refills | Status: DC

## 2019-02-14 NOTE — Progress Notes (Signed)
Office Visit Note  Patient: [name]             Date of Birth: 1946-02-17           MRN: [id number]             PCP: Delsa Grana, PA-C Referring: Delsa Grana, PA-C Visit Date: 02/28/2019 Occupation: @GUAROCC @  Subjective:  Pain in multiple joints   History of Present Illness: [name] is a 73 y.o. female with history of seropositive rheumatoid arthritis and osteoarthritis.  She is on Enbrel 50 mg sq weekly injections and Arava 10 mg po daily. She has not missed any doses recently.  She was recently started on Synthroid and has been taking it as prescribed for hypothyroidism. She reports she is having increased pain in both shoulder joints and both hands. She denies any joint swelling.  She is also having worsening neck pain and stiffness.  She denies any symptoms of radiculopathy at this time.  She is experiencing muscle tension and spasms intermittently.     Activities of Daily Living:  Patient reports morning stiffness for 30 minutes.   Patient Reports nocturnal pain.  Difficulty dressing/grooming: Denies Difficulty climbing stairs: Denies Difficulty getting out of chair: Denies Difficulty using hands for taps, buttons, cutlery, and/or writing: Denies  Review of Systems  Constitutional: Positive for fatigue.  HENT: Negative for mouth sores, mouth dryness and nose dryness.   Eyes: Negative for pain, itching, visual disturbance and dryness.  Respiratory: Negative for cough, hemoptysis, shortness of breath, wheezing and difficulty breathing.   Cardiovascular: Negative for chest pain, palpitations, hypertension and swelling in legs/feet.  Gastrointestinal: Negative for abdominal pain, blood in stool, constipation and diarrhea.  Endocrine: Negative for increased urination.  Genitourinary: Negative for painful urination and pelvic pain.  Musculoskeletal: Positive for arthralgias, joint pain, joint swelling and morning stiffness. Negative for myalgias, muscle weakness, muscle  tenderness and myalgias.  Skin: Negative for color change, pallor, rash, hair loss, nodules/bumps, redness, skin tightness, ulcers and sensitivity to sunlight.  Allergic/Immunologic: Negative for susceptible to infections.  Neurological: Positive for headaches. Negative for numbness, memory loss and weakness.  Hematological: Negative for swollen glands.  Psychiatric/Behavioral: Positive for sleep disturbance. Negative for depressed mood and confusion. The patient is not nervous/anxious.     PMFS History:  Patient Active Problem List   Diagnosis Date Noted  . Diabetes mellitus type 2, uncomplicated ([place]) 65/10/5463  . History of total bilateral knee replacement 06/22/2017  . High risk medication use 08/20/2016  . Primary osteoarthritis of both hands 08/20/2016  . Hypomagnesemia 07/21/2016  . Thyroid nodule 03/05/2016  . Diabetic eye exam ([place]) 10/15/2014  . Multiple pulmonary nodules 04/04/2014  . Anemia 11/21/2013  . 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 Bursitis of right shoulder   . GERD (gastroesophageal reflux disease)   . Morbid obesity due to excess calories ([place]) 12/27/2012  . 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 without organ or systems involvement ([place])   . Essential hypertension, benign 10/14/2012  . Hyperlipidemia   . Fatty liver disease, nonalcoholic   . Osteoarthritis of right knee 08/05/2012    Past Medical History:  Diagnosis Date  . Arthritis    osteoarthritis  . Bursitis of right shoulder   . Chronic cholecystitis with calculus 05/04/2013  . 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 Diabetes type 2, controlled ([place]) 05/04/2013  . Fatty liver disease, nonalcoholic 6812   hospitalized, MRCP dx  fatty liver  . GERD (gastroesophageal reflux disease)   . History of hypertension 08/20/2016  . Hormone replacement therapy (postmenopausal)   . Hormone replacement therapy (postmenopausal)   . Hyperlipidemia   .  Hypertension   . Hypertensive urgency 07/11/2018  . Hypokalemia 07/11/2018  . Malnutrition of moderate degree ([place]) 03/18/2014  . Nausea & vomiting 07/11/2018  . Normal cardiac stress test    pt can't remember when or where  . Obesity, unspecified 12/27/2012  . Rheumatoid arthritis(714.0) 09/28/2012  . Sepsis ([place]) 03/16/2014  . SIRS (systemic inflammatory response syndrome) ([place]) 07/11/2018  . 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 Unspecified essential hypertension 05/04/2013    Family History  Problem Relation Age of Onset  . Cancer Mother        breast  . Heart attack Mother   . Stroke Father   . Diabetes Sister   . Diabetes Daughter    Past Surgical History:  Procedure Laterality Date  . ABDOMINAL HYSTERECTOMY    . CHOLECYSTECTOMY N/A 05/04/2013   Procedure: LAPAROSCOPIC CHOLECYSTECTOMY WITH INTRAOPERATIVE CHOLANGIOGRAM;  Surgeon: Haywood Lasso, MD;  Location: [facility];  Service: General;  Laterality: N/A;  . ERCP N/A 05/02/2013   Procedure: ENDOSCOPIC RETROGRADE CHOLANGIOPANCREATOGRAPHY (ERCP);  Surgeon: Beryle Beams, MD;  Location: [facility];  Service: Endoscopy;  Laterality: N/A;  . JOINT REPLACEMENT     left knee  . NASAL SINUS SURGERY    . SPHINCTEROTOMY  05/02/2013   Procedure: SPHINCTEROTOMY;  Surgeon: Beryle Beams, MD;  Location: [facility];  Service: Endoscopy;;  . TONSILLECTOMY    . TOTAL KNEE ARTHROPLASTY  08/01/2012   Procedure: TOTAL KNEE ARTHROPLASTY;  Surgeon: Meredith Pel, MD;  Location: [facility];  Service: Orthopedics;  Laterality: Right;  Right total knee arthroplasty  . TOTAL KNEE ARTHROPLASTY  08/01/2012   RIGHT  KNEE   Social History   Social History Narrative  . Not on file   Immunization History  Administered Date(s) Administered  . Influenza Split 05/23/2012  . Influenza, High Dose Seasonal PF 07/13/2018  . Influenza,inj,Quad PF,6+ Mos 06/16/2017  . Influenza-Unspecified 05/23/2013, 06/06/2014  . PPD Test 06/02/2016  .  Pneumococcal Conjugate-13 07/04/2013  . Pneumococcal Polysaccharide-23 06/23/2011, 08/02/2012, 05/26/2016  . Tdap 06/23/2011, 05/23/2014  . Zoster 09/24/2011     Objective: Vital Signs: BP 130/88 (BP Location: Left Wrist, Patient Position: Sitting, Cuff Size: Normal)   Pulse 78   Resp 14   Ht 4\' 10"  (1.473 m)   Wt 157 lb 9.6 oz (71.5 kg)   BMI 32.94 kg/m    Physical Exam Vitals signs and nursing note reviewed.  Constitutional:      Appearance: She is well-developed.  HENT:     Head: Normocephalic and atraumatic.  Eyes:     Conjunctiva/sclera: Conjunctivae normal.  Neck:     Musculoskeletal: Normal range of motion.  Cardiovascular:     Rate and Rhythm: Normal rate and regular rhythm.     Heart sounds: Normal heart sounds.  Pulmonary:     Effort: Pulmonary effort is normal.     Breath sounds: Normal breath sounds.  Abdominal:     General: Bowel sounds are normal.     Palpations: Abdomen is soft.  Lymphadenopathy:     Cervical: No cervical adenopathy.  Skin:    General: Skin is warm and dry.     Capillary Refill: Capillary refill takes less than 2 seconds.  Neurological:     Mental Status: She is alert and  oriented to person, place, and time.  Psychiatric:        Behavior: Behavior normal.      Musculoskeletal Exam: C-spine limited ROM.  Postural thoracic kyphosis.  Shoulder joints limited ROM with discomfort. Synovial thickening of MCP, PIPs, and DIPs.  Synovial thickening of bilateral CMC joints. No synovitis noted. Bilateral knee replacements have good ROM with warmth but no effusion.  Synovial thickening of both ankle joints. Pedal edema bilaterally.   CDAI Exam: CDAI Score: - Patient Global: -; Provider Global: - Swollen: -; Tender: - Joint Exam   No joint exam has been documented for this visit   There is currently no information documented on the homunculus. Go to the Rheumatology activity and complete the homunculus joint exam.  Investigation: No  additional findings.  Imaging: No results found.  Recent Labs: Lab Results  Component Value Date   WBC 6.0 01/30/2019   HGB 13.0 01/30/2019   PLT 156 01/30/2019   NA 140 01/30/2019   K 3.7 01/30/2019   CL 102 01/30/2019   CO2 28 01/30/2019   GLUCOSE 118 (H) 01/30/2019   BUN 20 01/30/2019   CREATININE 0.98 (H) 01/30/2019   BILITOT 0.6 01/30/2019   ALKPHOS 76 07/13/2018   AST 30 01/30/2019   ALT 29 01/30/2019   PROT 6.5 01/30/2019   ALBUMIN 2.4 (L) 07/13/2018   CALCIUM 10.0 01/30/2019   GFRAA 66 01/30/2019   QFTBGOLD NEGATIVE 05/17/2017   QFTBGOLDPLUS NEGATIVE 04/21/2018    Speciality Comments: No specialty comments available.  Procedures:  No procedures performed Allergies: Monopril [fosinopril] and Norvasc [amlodipine besylate]   Assessment / Plan:     Visit Diagnoses: 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 without organ or systems involvement ([place]) - +RF, +CCP, -ANA: She has no synovitis on exam.  She has not had any recent rheumatoid arthritis flares.  She has chronic pain in bilateral hands but no synovitis was noted on exam.  Discomfort in her hands is most likely due to osteoarthritis.  She presents today with increased neck pain and stiffness.  X-rays of her C-spine with flexion-extension will be obtained today to assess for subluxation due to her history of rheumatoid arthritis.  She is clinically doing well on Enbrel 50 mg every days injections every week and Arava 10 mg 1 tablet by mouth daily.  She will continue on his current treatment regimen.  She does not need any refills at this time.  She is advised to notify us if she has increased joint pain or joint swelling.  She will follow-up in the office in 5 months.  High risk medication use - Enbrel SureClick 50 mg every 7 days (started on 06/25/16) and Arava 10 mg 1 tablet daily.  Last TB gold negative on 04/21/2018 and will monitor yearly. Future order placed today.  Most recent CBC/CMP within  normal limits except for slightly elevated creatinine and decreased GFR on 01/30/2019.  Due for CBC/CMP in September and will monitor every 3 months.  Standing orders are in place.  She received the high-dose flu vaccine in November and is up-to-date with pneumonia vaccines.  She previously had Zostavax vaccine.- Plan: QuantiFERON-TB Gold Plus  Primary osteoarthritis of both hands - She has PIP and DIP synovial thickening consistent with osteoarthritis of bilateral hands.  She has intermittent pain in bilateral hands but no joint swelling.  She has no synovitis today.  She has complete fist formation bilaterally.  Joint protection and muscle strengthening were discussed.  History  of total bilateral knee replacement - Right= 2013. Left=2003 - Doing well.  She has good ROM.  Warmth of both knee replacements but no effusion.    Osteopenia of right hip -DEXA 01/26/2017 right femoral neck BMD 0.652, T score -1.8.  A repeat DEXA was ordered today.  Thoracic kyphosis was noted.  No midline spinal tenderness.  She has been taking calcium and vitamin D supplements.  Plan: Plan: DG BONE DENSITY (DXA)  Greater trochanteric bursitis of left hip - Resolved   Neck pain -She presents today with increased neck pain and stiffness.  She has limited range of motion with discomfort.  She has no symptoms of radiculopathy at this time.  She has been experiencing muscle tension and muscle tenderness in bilateral trapezius muscles.  She has been having intermittent muscle spasms.  She was given a prescription for Robaxin 500 mg 1 tablet as needed for muscle spasms.  We will also obtain x-rays of her C-spine with flexion and extension to assess for subluxation due to her history of rheumatoid arthritis.  We will call her with these results.  Other medical conditions are listed as follows:   Multiple pulmonary nodules  History of hypertension   Fatty liver disease, nonalcoholic   History of diabetes mellitus   History of  hyperlipidemia   History of gastroesophageal reflux (GERD)     Orders: Orders Placed This Encounter  Procedures  . DG BONE DENSITY (DXA)  . QuantiFERON-TB Gold Plus   Meds ordered this encounter  Medications  . methocarbamol (ROBAXIN) 500 MG tablet    Sig: Take 1 tablet (500 mg total) by mouth daily as needed for muscle spasms.    Dispense:  30 tablet    Refill:  0    Face-to-face time spent with patient was 30 minutes. Greater than 50% of time was spent in counseling and coordination of care.  Follow-Up Instructions: Return in about 5 months (around 07/31/2019) for Rheumatoid arthritis, Osteoarthritis.   Ofilia Neas, PA-C  Note - This record has been created using Dragon software.  Chart creation errors have been sought, but may not always  have been located. Such creation errors do not reflect on  the standard of medical care.

## 2019-02-15 LAB — FECAL GLOBIN BY IMMUNOCHEMISTRY

## 2019-02-28 ENCOUNTER — Encounter: Payer: Self-pay | Admitting: Physician Assistant

## 2019-02-28 ENCOUNTER — Other Ambulatory Visit: Payer: Self-pay

## 2019-02-28 ENCOUNTER — Other Ambulatory Visit: Payer: Self-pay | Admitting: Family Medicine

## 2019-02-28 ENCOUNTER — Ambulatory Visit: Payer: PPO | Admitting: Physician Assistant

## 2019-02-28 ENCOUNTER — Ambulatory Visit (HOSPITAL_COMMUNITY)
Admission: RE | Admit: 2019-02-28 | Discharge: 2019-02-28 | Disposition: A | Discharge status: Home / Self Care | Payer: PPO | Source: Ambulatory Visit | Attending: Rheumatology | Admitting: Rheumatology

## 2019-02-28 VITALS — BP 130/88 | HR 78 | Resp 14 | Ht <= 58 in | Wt 157.6 lb

## 2019-02-28 DIAGNOSIS — M50321 Other cervical disc degeneration at C4-C5 level: Secondary | ICD-10-CM | POA: Diagnosis not present

## 2019-02-28 DIAGNOSIS — R6884 Jaw pain: Secondary | ICD-10-CM | POA: Diagnosis not present

## 2019-02-28 DIAGNOSIS — Z8679 Personal history of other diseases of the circulatory system: Secondary | ICD-10-CM

## 2019-02-28 DIAGNOSIS — M25562 Pain in left knee: Secondary | ICD-10-CM | POA: Diagnosis not present

## 2019-02-28 DIAGNOSIS — Z8639 Personal history of other endocrine, nutritional and metabolic disease: Secondary | ICD-10-CM | POA: Diagnosis not present

## 2019-02-28 DIAGNOSIS — E669 Obesity, unspecified: Secondary | ICD-10-CM | POA: Diagnosis present

## 2019-02-28 DIAGNOSIS — M0579 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 without organ or systems involvement: Secondary | ICD-10-CM

## 2019-02-28 DIAGNOSIS — M19041 Primary osteoarthritis, right hand: Secondary | ICD-10-CM

## 2019-02-28 DIAGNOSIS — E1143 Type 2 diabetes mellitus with diabetic autonomic (poly)neuropathy: Secondary | ICD-10-CM | POA: Diagnosis present

## 2019-02-28 DIAGNOSIS — M5030 Other cervical disc degeneration, unspecified cervical region: Secondary | ICD-10-CM | POA: Diagnosis present

## 2019-02-28 DIAGNOSIS — Z96653 Presence of artificial knee joint, bilateral: Secondary | ICD-10-CM | POA: Diagnosis not present

## 2019-02-28 DIAGNOSIS — I499 Cardiac arrhythmia, unspecified: Secondary | ICD-10-CM | POA: Diagnosis not present

## 2019-02-28 DIAGNOSIS — M50323 Other cervical disc degeneration at C6-C7 level: Secondary | ICD-10-CM | POA: Diagnosis not present

## 2019-02-28 DIAGNOSIS — I10 Essential (primary) hypertension: Secondary | ICD-10-CM | POA: Diagnosis present

## 2019-02-28 DIAGNOSIS — Z79899 Other long term (current) drug therapy: Secondary | ICD-10-CM | POA: Diagnosis not present

## 2019-02-28 DIAGNOSIS — M50322 Other cervical disc degeneration at C5-C6 level: Secondary | ICD-10-CM | POA: Diagnosis not present

## 2019-02-28 DIAGNOSIS — E785 Hyperlipidemia, unspecified: Secondary | ICD-10-CM | POA: Diagnosis present

## 2019-02-28 DIAGNOSIS — K529 Noninfective gastroenteritis and colitis, unspecified: Secondary | ICD-10-CM | POA: Diagnosis not present

## 2019-02-28 DIAGNOSIS — Z6834 Body mass index (BMI) 34.0-34.9, adult: Secondary | ICD-10-CM | POA: Diagnosis not present

## 2019-02-28 DIAGNOSIS — Z8719 Personal history of other diseases of the digestive system: Secondary | ICD-10-CM | POA: Diagnosis not present

## 2019-02-28 DIAGNOSIS — M7062 Trochanteric bursitis, left hip: Secondary | ICD-10-CM

## 2019-02-28 DIAGNOSIS — M85851 Other specified disorders of bone density and structure, right thigh: Secondary | ICD-10-CM

## 2019-02-28 DIAGNOSIS — I951 Orthostatic hypotension: Secondary | ICD-10-CM | POA: Diagnosis present

## 2019-02-28 DIAGNOSIS — R55 Syncope and collapse: Secondary | ICD-10-CM | POA: Diagnosis present

## 2019-02-28 DIAGNOSIS — I480 Paroxysmal atrial fibrillation: Secondary | ICD-10-CM | POA: Diagnosis not present

## 2019-02-28 DIAGNOSIS — S8992XA Unspecified injury of left lower leg, initial encounter: Secondary | ICD-10-CM | POA: Diagnosis not present

## 2019-02-28 DIAGNOSIS — M47812 Spondylosis without myelopathy or radiculopathy, cervical region: Secondary | ICD-10-CM | POA: Diagnosis present

## 2019-02-28 DIAGNOSIS — K219 Gastro-esophageal reflux disease without esophagitis: Secondary | ICD-10-CM | POA: Diagnosis present

## 2019-02-28 DIAGNOSIS — E861 Hypovolemia: Secondary | ICD-10-CM | POA: Diagnosis present

## 2019-02-28 DIAGNOSIS — R918 Other nonspecific abnormal finding of lung field: Secondary | ICD-10-CM

## 2019-02-28 DIAGNOSIS — K760 Fatty (change of) liver, not elsewhere classified: Secondary | ICD-10-CM | POA: Diagnosis present

## 2019-02-28 DIAGNOSIS — M0689 Other specified rheumatoid arthritis, multiple sites: Secondary | ICD-10-CM | POA: Diagnosis present

## 2019-02-28 DIAGNOSIS — I959 Hypotension, unspecified: Secondary | ICD-10-CM | POA: Diagnosis not present

## 2019-02-28 DIAGNOSIS — E860 Dehydration: Secondary | ICD-10-CM | POA: Diagnosis present

## 2019-02-28 DIAGNOSIS — S0993XA Unspecified injury of face, initial encounter: Secondary | ICD-10-CM | POA: Diagnosis not present

## 2019-02-28 DIAGNOSIS — E876 Hypokalemia: Secondary | ICD-10-CM | POA: Diagnosis present

## 2019-02-28 DIAGNOSIS — A084 Viral intestinal infection, unspecified: Secondary | ICD-10-CM | POA: Diagnosis present

## 2019-02-28 DIAGNOSIS — R000 Tachycardia, unspecified: Secondary | ICD-10-CM | POA: Diagnosis not present

## 2019-02-28 DIAGNOSIS — M25462 Effusion, left knee: Secondary | ICD-10-CM | POA: Diagnosis present

## 2019-02-28 DIAGNOSIS — I351 Nonrheumatic aortic (valve) insufficiency: Secondary | ICD-10-CM | POA: Diagnosis not present

## 2019-02-28 DIAGNOSIS — I361 Nonrheumatic tricuspid (valve) insufficiency: Secondary | ICD-10-CM | POA: Diagnosis not present

## 2019-02-28 DIAGNOSIS — R0902 Hypoxemia: Secondary | ICD-10-CM | POA: Diagnosis not present

## 2019-02-28 DIAGNOSIS — Z20828 Contact with and (suspected) exposure to other viral communicable diseases: Secondary | ICD-10-CM | POA: Diagnosis present

## 2019-02-28 DIAGNOSIS — E875 Hyperkalemia: Secondary | ICD-10-CM | POA: Diagnosis not present

## 2019-02-28 DIAGNOSIS — M542 Cervicalgia: Secondary | ICD-10-CM

## 2019-02-28 DIAGNOSIS — I248 Other forms of acute ischemic heart disease: Secondary | ICD-10-CM | POA: Diagnosis present

## 2019-02-28 DIAGNOSIS — M19042 Primary osteoarthritis, left hand: Secondary | ICD-10-CM

## 2019-02-28 DIAGNOSIS — I4891 Unspecified atrial fibrillation: Secondary | ICD-10-CM | POA: Diagnosis present

## 2019-02-28 DIAGNOSIS — S72402A Unspecified fracture of lower end of left femur, initial encounter for closed fracture: Secondary | ICD-10-CM | POA: Diagnosis not present

## 2019-02-28 DIAGNOSIS — K3184 Gastroparesis: Secondary | ICD-10-CM | POA: Diagnosis present

## 2019-02-28 MED ORDER — METHOCARBAMOL 500 MG PO TABS: 500.0000 mg | ORAL_TABLET | Freq: Every day | ORAL | 0 refills | Status: AC | PRN

## 2019-02-28 NOTE — Progress Notes (Signed)
X-ray showed degenerative disc disease and facet joint arthropathy.  If she does not have good response to Robaxin then we can refer her to physical therapy.

## 2019-02-28 NOTE — Patient Instructions (Signed)
Standing Labs We placed an order today for your standing lab work.    Please come back and get your standing labs in September and every 3 months   TB gold, CBC, and CMP   We have open lab daily Monday through Thursday from 8:30-12:30 PM and 1:30-4:30 PM and Friday from 8:30-12:30 PM and 1:30 -4:00 PM at the office of Dr. Bo Merino.   You may experience shorter wait times on Monday and Friday afternoons. The office is located at [address], [place], [place], [place] [address] No appointment is necessary.   Labs are drawn by [facility].  You may receive a bill from [facility] for your lab work.  If you wish to have your labs drawn at another location, please call the office 24 hours in advance to send orders.  If you have any questions regarding directions or hours of operation,  please call [phone].   Just as a reminder please drink plenty of water prior to coming for your lab work. Thanks!

## 2019-03-01 ENCOUNTER — Telehealth: Payer: Self-pay | Admitting: *Deleted

## 2019-03-01 NOTE — Telephone Encounter (Signed)
Received a Prior Authorization request from [facility] for Methocarbamol. Authorization has been submitted to patient's insurance via Cover My Meds. Will update once we receive a response.

## 2019-03-02 ENCOUNTER — Ambulatory Visit: Payer: Self-pay | Admitting: Family Medicine

## 2019-03-03 ENCOUNTER — Encounter (HOSPITAL_COMMUNITY): Payer: Self-pay | Admitting: Emergency Medicine

## 2019-03-03 ENCOUNTER — Other Ambulatory Visit: Payer: Self-pay

## 2019-03-03 ENCOUNTER — Inpatient Hospital Stay (HOSPITAL_COMMUNITY): Payer: PPO

## 2019-03-03 ENCOUNTER — Inpatient Hospital Stay (HOSPITAL_COMMUNITY)
Admission: EM | Admit: 2019-03-03 | Discharge: 2019-03-07 | DRG: 309 | Disposition: A | Discharge status: Home Health | Payer: PPO | Attending: Family Medicine | Admitting: Family Medicine

## 2019-03-03 ENCOUNTER — Emergency Department (HOSPITAL_COMMUNITY): Payer: PPO

## 2019-03-03 DIAGNOSIS — R55 Syncope and collapse: Secondary | ICD-10-CM | POA: Diagnosis present

## 2019-03-03 DIAGNOSIS — I10 Essential (primary) hypertension: Secondary | ICD-10-CM | POA: Diagnosis present

## 2019-03-03 DIAGNOSIS — I951 Orthostatic hypotension: Secondary | ICD-10-CM | POA: Diagnosis present

## 2019-03-03 DIAGNOSIS — I248 Other forms of acute ischemic heart disease: Secondary | ICD-10-CM | POA: Diagnosis present

## 2019-03-03 DIAGNOSIS — E861 Hypovolemia: Secondary | ICD-10-CM | POA: Diagnosis present

## 2019-03-03 DIAGNOSIS — K760 Fatty (change of) liver, not elsewhere classified: Secondary | ICD-10-CM | POA: Diagnosis present

## 2019-03-03 DIAGNOSIS — M47812 Spondylosis without myelopathy or radiculopathy, cervical region: Secondary | ICD-10-CM | POA: Diagnosis present

## 2019-03-03 DIAGNOSIS — K219 Gastro-esophageal reflux disease without esophagitis: Secondary | ICD-10-CM | POA: Diagnosis present

## 2019-03-03 DIAGNOSIS — M25462 Effusion, left knee: Secondary | ICD-10-CM | POA: Diagnosis present

## 2019-03-03 DIAGNOSIS — E1143 Type 2 diabetes mellitus with diabetic autonomic (poly)neuropathy: Secondary | ICD-10-CM | POA: Diagnosis present

## 2019-03-03 DIAGNOSIS — Z96653 Presence of artificial knee joint, bilateral: Secondary | ICD-10-CM | POA: Diagnosis present

## 2019-03-03 DIAGNOSIS — M0689 Other specified rheumatoid arthritis, multiple sites: Secondary | ICD-10-CM | POA: Diagnosis present

## 2019-03-03 DIAGNOSIS — Z833 Family history of diabetes mellitus: Secondary | ICD-10-CM

## 2019-03-03 DIAGNOSIS — I4891 Unspecified atrial fibrillation: Principal | ICD-10-CM

## 2019-03-03 DIAGNOSIS — Z7984 Long term (current) use of oral hypoglycemic drugs: Secondary | ICD-10-CM

## 2019-03-03 DIAGNOSIS — Z20828 Contact with and (suspected) exposure to other viral communicable diseases: Secondary | ICD-10-CM | POA: Diagnosis present

## 2019-03-03 DIAGNOSIS — Z87891 Personal history of nicotine dependence: Secondary | ICD-10-CM

## 2019-03-03 DIAGNOSIS — Z791 Long term (current) use of non-steroidal anti-inflammatories (NSAID): Secondary | ICD-10-CM

## 2019-03-03 DIAGNOSIS — Z7982 Long term (current) use of aspirin: Secondary | ICD-10-CM

## 2019-03-03 DIAGNOSIS — Z79899 Other long term (current) drug therapy: Secondary | ICD-10-CM

## 2019-03-03 DIAGNOSIS — I351 Nonrheumatic aortic (valve) insufficiency: Secondary | ICD-10-CM | POA: Diagnosis not present

## 2019-03-03 DIAGNOSIS — E785 Hyperlipidemia, unspecified: Secondary | ICD-10-CM | POA: Diagnosis present

## 2019-03-03 DIAGNOSIS — E860 Dehydration: Secondary | ICD-10-CM | POA: Diagnosis present

## 2019-03-03 DIAGNOSIS — Z888 Allergy status to other drugs, medicaments and biological substances status: Secondary | ICD-10-CM

## 2019-03-03 DIAGNOSIS — M5030 Other cervical disc degeneration, unspecified cervical region: Secondary | ICD-10-CM | POA: Diagnosis present

## 2019-03-03 DIAGNOSIS — M19041 Primary osteoarthritis, right hand: Secondary | ICD-10-CM | POA: Diagnosis present

## 2019-03-03 DIAGNOSIS — M1711 Unilateral primary osteoarthritis, right knee: Secondary | ICD-10-CM

## 2019-03-03 DIAGNOSIS — Z6834 Body mass index (BMI) 34.0-34.9, adult: Secondary | ICD-10-CM

## 2019-03-03 DIAGNOSIS — K3184 Gastroparesis: Secondary | ICD-10-CM | POA: Diagnosis present

## 2019-03-03 DIAGNOSIS — Z79890 Hormone replacement therapy (postmenopausal): Secondary | ICD-10-CM

## 2019-03-03 DIAGNOSIS — A084 Viral intestinal infection, unspecified: Secondary | ICD-10-CM | POA: Diagnosis present

## 2019-03-03 DIAGNOSIS — E669 Obesity, unspecified: Secondary | ICD-10-CM | POA: Diagnosis present

## 2019-03-03 DIAGNOSIS — E875 Hyperkalemia: Secondary | ICD-10-CM | POA: Diagnosis not present

## 2019-03-03 DIAGNOSIS — Z823 Family history of stroke: Secondary | ICD-10-CM

## 2019-03-03 DIAGNOSIS — M19042 Primary osteoarthritis, left hand: Secondary | ICD-10-CM | POA: Diagnosis present

## 2019-03-03 DIAGNOSIS — E876 Hypokalemia: Secondary | ICD-10-CM | POA: Diagnosis present

## 2019-03-03 DIAGNOSIS — I361 Nonrheumatic tricuspid (valve) insufficiency: Secondary | ICD-10-CM | POA: Diagnosis not present

## 2019-03-03 DIAGNOSIS — Z8249 Family history of ischemic heart disease and other diseases of the circulatory system: Secondary | ICD-10-CM

## 2019-03-03 DIAGNOSIS — W19XXXA Unspecified fall, initial encounter: Secondary | ICD-10-CM

## 2019-03-03 DIAGNOSIS — I480 Paroxysmal atrial fibrillation: Secondary | ICD-10-CM | POA: Diagnosis not present

## 2019-03-03 DIAGNOSIS — K529 Noninfective gastroenteritis and colitis, unspecified: Secondary | ICD-10-CM

## 2019-03-03 HISTORY — DX: Syncope and collapse: R55

## 2019-03-03 LAB — POCT I-STAT EG7
Acid-Base Excess: 5 mmol/L — ABNORMAL HIGH (ref 0.0–2.0)
Bicarbonate: 27.6 mmol/L (ref 20.0–28.0)
Calcium, Ion: 0.88 mmol/L — CL (ref 1.15–1.40)
HCT: 49 % — ABNORMAL HIGH (ref 36.0–46.0)
Hemoglobin: 16.7 g/dL — ABNORMAL HIGH (ref 12.0–15.0)
O2 Saturation: 100 %
Potassium: 3 mmol/L — ABNORMAL LOW (ref 3.5–5.1)
Sodium: 137 mmol/L (ref 135–145)
TCO2: 29 mmol/L (ref 22–32)
pCO2, Ven: 33.4 mmHg — ABNORMAL LOW (ref 44.0–60.0)
pH, Ven: 7.525 — ABNORMAL HIGH (ref 7.250–7.430)
pO2, Ven: 164 mmHg — ABNORMAL HIGH (ref 32.0–45.0)

## 2019-03-03 LAB — URINALYSIS, ROUTINE W REFLEX MICROSCOPIC
Bacteria, UA: NONE SEEN
Bilirubin Urine: NEGATIVE
Glucose, UA: NEGATIVE mg/dL
Ketones, ur: 5 mg/dL — AB
Nitrite: NEGATIVE
Protein, ur: 100 mg/dL — AB
Specific Gravity, Urine: 1.013 (ref 1.005–1.030)
pH: 7 (ref 5.0–8.0)

## 2019-03-03 LAB — TROPONIN I (HIGH SENSITIVITY)
Troponin I (High Sensitivity): 64 ng/L — ABNORMAL HIGH (ref <18)
Troponin I (High Sensitivity): 78 ng/L — ABNORMAL HIGH (ref <18)
Troponin I (High Sensitivity): 93 ng/L — ABNORMAL HIGH (ref <18)
Troponin I (High Sensitivity): 93 ng/L — ABNORMAL HIGH (ref <18)

## 2019-03-03 LAB — CBC WITH DIFFERENTIAL/PLATELET
Abs Immature Granulocytes: 0.07 10*3/uL (ref 0.00–0.07)
Basophils Absolute: 0 10*3/uL (ref 0.0–0.1)
Basophils Relative: 0 %
Eosinophils Absolute: 0 10*3/uL (ref 0.0–0.5)
Eosinophils Relative: 0 %
HCT: 48.7 % — ABNORMAL HIGH (ref 36.0–46.0)
Hemoglobin: 16.2 g/dL — ABNORMAL HIGH (ref 12.0–15.0)
Immature Granulocytes: 1 %
Lymphocytes Relative: 12 %
Lymphs Abs: 1.5 10*3/uL (ref 0.7–4.0)
MCH: 32 pg (ref 26.0–34.0)
MCHC: 33.3 g/dL (ref 30.0–36.0)
MCV: 96.2 fL (ref 80.0–100.0)
Monocytes Absolute: 1.3 10*3/uL — ABNORMAL HIGH (ref 0.1–1.0)
Monocytes Relative: 11 %
Neutro Abs: 9.2 10*3/uL — ABNORMAL HIGH (ref 1.7–7.7)
Neutrophils Relative %: 76 %
Platelets: 237 10*3/uL (ref 150–400)
RBC: 5.06 MIL/uL (ref 3.87–5.11)
RDW: 14.4 % (ref 11.5–15.5)
WBC: 12.1 10*3/uL — ABNORMAL HIGH (ref 4.0–10.5)
nRBC: 0 % (ref 0.0–0.2)

## 2019-03-03 LAB — RAPID URINE DRUG SCREEN, HOSP PERFORMED
Amphetamines: NOT DETECTED
Barbiturates: NOT DETECTED
Benzodiazepines: NOT DETECTED
Cocaine: NOT DETECTED
Opiates: NOT DETECTED
Tetrahydrocannabinol: NOT DETECTED

## 2019-03-03 LAB — COMPREHENSIVE METABOLIC PANEL
ALT: 40 U/L (ref 0–44)
AST: 60 U/L — ABNORMAL HIGH (ref 15–41)
Albumin: 3.3 g/dL — ABNORMAL LOW (ref 3.5–5.0)
Alkaline Phosphatase: 113 U/L (ref 38–126)
Anion gap: 15 (ref 5–15)
BUN: 32 mg/dL — ABNORMAL HIGH (ref 8–23)
CO2: 22 mmol/L (ref 22–32)
Calcium: 8.7 mg/dL — ABNORMAL LOW (ref 8.9–10.3)
Chloride: 101 mmol/L (ref 98–111)
Creatinine, Ser: 1.07 mg/dL — ABNORMAL HIGH (ref 0.44–1.00)
GFR calc Af Amer: 59 mL/min — ABNORMAL LOW (ref >60)
GFR calc non Af Amer: 51 mL/min — ABNORMAL LOW (ref >60)
Glucose, Bld: 190 mg/dL — ABNORMAL HIGH (ref 70–99)
Potassium: 3 mmol/L — ABNORMAL LOW (ref 3.5–5.1)
Sodium: 138 mmol/L (ref 135–145)
Total Bilirubin: 1.2 mg/dL (ref 0.3–1.2)
Total Protein: 6.7 g/dL (ref 6.5–8.1)

## 2019-03-03 LAB — PROTIME-INR
INR: 1.2 (ref 0.8–1.2)
Prothrombin Time: 14.6 seconds (ref 11.4–15.2)

## 2019-03-03 LAB — HEMOGLOBIN A1C
Hgb A1c MFr Bld: 6.4 % — ABNORMAL HIGH (ref 4.8–5.6)
Mean Plasma Glucose: 136.98 mg/dL

## 2019-03-03 LAB — BRAIN NATRIURETIC PEPTIDE: B Natriuretic Peptide: 341.9 pg/mL — ABNORMAL HIGH (ref 0.0–100.0)

## 2019-03-03 LAB — PHOSPHORUS: Phosphorus: 3 mg/dL (ref 2.5–4.6)

## 2019-03-03 LAB — LACTIC ACID, PLASMA
Lactic Acid, Venous: 2.3 mmol/L (ref 0.5–1.9)
Lactic Acid, Venous: 1.7 mmol/L (ref 0.5–1.9)

## 2019-03-03 LAB — MAGNESIUM: Magnesium: 1.1 mg/dL — ABNORMAL LOW (ref 1.7–2.4)

## 2019-03-03 LAB — GLUCOSE, CAPILLARY: Glucose-Capillary: 135 mg/dL — ABNORMAL HIGH (ref 70–99)

## 2019-03-03 LAB — TSH: TSH: 3.788 u[IU]/mL (ref 0.350–4.500)

## 2019-03-03 LAB — SARS CORONAVIRUS 2 BY RT PCR (HOSPITAL ORDER, PERFORMED IN ~~LOC~~ HOSPITAL LAB): SARS Coronavirus 2: NEGATIVE

## 2019-03-03 LAB — LIPASE, BLOOD: Lipase: 30 U/L (ref 11–51)

## 2019-03-03 MED ORDER — LEVOTHYROXINE SODIUM 25 MCG PO TABS
25.0000 ug | ORAL_TABLET | Freq: Every day | ORAL | Status: DC
  Administered 2019-03-04 – 2019-03-07 (×4): 25 ug via ORAL
  Filled 2019-03-03 (×4): qty 1

## 2019-03-03 MED ORDER — POTASSIUM CHLORIDE CRYS ER 20 MEQ PO TBCR
40.0000 meq | EXTENDED_RELEASE_TABLET | Freq: Once | ORAL | Status: DC
  Filled 2019-03-03: qty 2

## 2019-03-03 MED ORDER — ACETAMINOPHEN 650 MG RE SUPP: 650.0000 mg | Freq: Four times a day (QID) | RECTAL | Status: DC | PRN

## 2019-03-03 MED ORDER — ASPIRIN EC 81 MG PO TBEC
81.0000 mg | DELAYED_RELEASE_TABLET | Freq: Every day | ORAL | Status: DC
  Administered 2019-03-03 – 2019-03-05 (×3): 81 mg via ORAL
  Filled 2019-03-03 (×3): qty 1

## 2019-03-03 MED ORDER — POTASSIUM CHLORIDE 10 MEQ/100ML IV SOLN
10.0000 meq | Freq: Once | INTRAVENOUS | Status: AC
  Administered 2019-03-03: 13:00:00 10 meq via INTRAVENOUS
  Filled 2019-03-03: qty 100

## 2019-03-03 MED ORDER — METOPROLOL TARTRATE 12.5 MG HALF TABLET: 12.5000 mg | ORAL_TABLET | Freq: Two times a day (BID) | ORAL | Status: DC

## 2019-03-03 MED ORDER — DILTIAZEM LOAD VIA INFUSION
15.0000 mg | Freq: Once | INTRAVENOUS | Status: AC
  Administered 2019-03-03: 15 mg via INTRAVENOUS
  Filled 2019-03-03: qty 15

## 2019-03-03 MED ORDER — ONDANSETRON HCL 4 MG/2ML IJ SOLN
4.0000 mg | Freq: Four times a day (QID) | INTRAMUSCULAR | Status: DC
  Administered 2019-03-03 – 2019-03-04 (×2): 4 mg via INTRAVENOUS
  Filled 2019-03-03 (×3): qty 2

## 2019-03-03 MED ORDER — ONDANSETRON HCL 4 MG PO TABS: 4.0000 mg | ORAL_TABLET | Freq: Four times a day (QID) | ORAL | Status: DC | PRN

## 2019-03-03 MED ORDER — SODIUM CHLORIDE 0.9 % IV BOLUS
1000.0000 mL | Freq: Once | INTRAVENOUS | Status: AC
  Administered 2019-03-03: 1000 mL via INTRAVENOUS

## 2019-03-03 MED ORDER — SODIUM CHLORIDE 0.9 % IV SOLN
1000.0000 mL | INTRAVENOUS | Status: DC
  Administered 2019-03-03 – 2019-03-04 (×2): 1000 mL via INTRAVENOUS

## 2019-03-03 MED ORDER — HEPARIN BOLUS VIA INFUSION
3500.0000 [IU] | Freq: Once | INTRAVENOUS | Status: AC
  Administered 2019-03-03: 3500 [IU] via INTRAVENOUS
  Filled 2019-03-03: qty 3500

## 2019-03-03 MED ORDER — SODIUM CHLORIDE 0.9 % IV BOLUS (SEPSIS)
1000.0000 mL | Freq: Once | INTRAVENOUS | Status: AC
  Administered 2019-03-03: 1000 mL via INTRAVENOUS

## 2019-03-03 MED ORDER — HEPARIN (PORCINE) 25000 UT/250ML-% IV SOLN
800.0000 [IU]/h | INTRAVENOUS | Status: DC
  Administered 2019-03-03 (×2): 800 [IU]/h via INTRAVENOUS

## 2019-03-03 MED ORDER — ACETAMINOPHEN 325 MG PO TABS
650.0000 mg | ORAL_TABLET | Freq: Four times a day (QID) | ORAL | Status: DC | PRN
  Administered 2019-03-04 – 2019-03-07 (×4): 650 mg via ORAL
  Filled 2019-03-03 (×4): qty 2

## 2019-03-03 MED ORDER — METOPROLOL TARTRATE 12.5 MG HALF TABLET
12.5000 mg | ORAL_TABLET | Freq: Four times a day (QID) | ORAL | Status: DC
  Administered 2019-03-03 – 2019-03-04 (×3): 12.5 mg via ORAL
  Filled 2019-03-03 (×3): qty 1

## 2019-03-03 MED ORDER — POTASSIUM CHLORIDE 10 MEQ/100ML IV SOLN
10.0000 meq | INTRAVENOUS | Status: AC
  Administered 2019-03-03 (×3): 10 meq via INTRAVENOUS
  Filled 2019-03-03: qty 100

## 2019-03-03 MED ORDER — LEFLUNOMIDE 20 MG PO TABS
10.0000 mg | ORAL_TABLET | Freq: Every day | ORAL | Status: DC
  Administered 2019-03-03 – 2019-03-07 (×5): 10 mg via ORAL
  Filled 2019-03-03 (×6): qty 0.5

## 2019-03-03 MED ORDER — METOPROLOL TARTRATE 5 MG/5ML IV SOLN
5.0000 mg | Freq: Three times a day (TID) | INTRAVENOUS | Status: DC
  Administered 2019-03-03: 5 mg via INTRAVENOUS

## 2019-03-03 MED ORDER — DILTIAZEM HCL-DEXTROSE 100-5 MG/100ML-% IV SOLN (PREMIX)
5.0000 mg/h | INTRAVENOUS | Status: DC
  Administered 2019-03-03: 5 mg/h via INTRAVENOUS
  Administered 2019-03-03: 15 mg/h via INTRAVENOUS
  Administered 2019-03-04: 5 mg/h via INTRAVENOUS
  Filled 2019-03-03 (×3): qty 100

## 2019-03-03 MED ORDER — ATORVASTATIN CALCIUM 10 MG PO TABS
10.0000 mg | ORAL_TABLET | Freq: Every day | ORAL | Status: DC
  Administered 2019-03-03 – 2019-03-07 (×5): 10 mg via ORAL
  Filled 2019-03-03 (×5): qty 1

## 2019-03-03 MED ORDER — MAGNESIUM SULFATE 2 GM/50ML IV SOLN
2.0000 g | Freq: Once | INTRAVENOUS | Status: AC
  Administered 2019-03-03: 2 g via INTRAVENOUS
  Filled 2019-03-03: qty 50

## 2019-03-03 MED ORDER — HEPARIN SODIUM (PORCINE) 5000 UNIT/ML IJ SOLN: 5000.0000 [IU] | Freq: Three times a day (TID) | INTRAMUSCULAR | Status: DC

## 2019-03-03 NOTE — Consult Note (Signed)
Cardiology Consultation:   Patient ID: [name] MRN: [id number]; DOB: [birth year]  Admit date: 03/03/2019 Date of Consult: 03/03/2019  Primary Care Provider: Delsa Grana, PA-C Primary Cardiologist: No primary care provider on file.  Primary Electrophysiologist:  None    Patient Profile:   [name] is a 73 y.o. female with a hx of HTN, DM2, HLD, who was admitted for syncope in the setting of persistent n/v who is being seen for the evaluation of new AF at the request of Dr. Ouida Sills.  History of Present Illness:   [name] is a 72 y.o. female with a hx of HTN, DM2, HLD, who was admitted for syncope in the setting of persistent n/v who is being seen for the evaluation of new AF.  The patient has no known cardiac history. She has experienced several days of nausea and vomiting and diarrhea without fevers. Today she stood up to walk to the bathroom and experienced lightheadedness and a sensation that things were going dark. She then fell and woke up on the floor. She reports that she felt generally normal upon waking. She denies chest pain, palpitations or dyspnea prior to the episode.  EMS was called, and the patient was noted to have HR in the 180s. She was given IV adenosine en route to the hospital, without apparent success.  She was brought to the ED, where was noted to be in AF with HR 90-146s.SBP 130-160s. HS-Tn was 64 and then 78. BNP 342. TSH wnl. COVID negative. She was given IVF. She was started on a diltiazem gtt and admitted to the family medicine service. Cardiology was consulted for further recommendations.  On my evaluation, the patient is resting in bed. She is in AF with HR 110-120s by telemetry. She reports minimal nausea but denies other complaints. She states that she is unaware of any cardiac history. She denies any prior palpitations or chest pain or dyspnea or other symptoms.    Heart Pathway Score:     Past Medical History:  Diagnosis Date  . Arthritis     osteoarthritis  . Bursitis of right shoulder   . Chronic cholecystitis with calculus 05/04/2013  . 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 Diabetes type 2, controlled ([place]) 05/04/2013  . Fatty liver disease, nonalcoholic 9518   hospitalized, MRCP dx fatty liver  . GERD (gastroesophageal reflux disease)   . History of hypertension 08/20/2016  . Hormone replacement therapy (postmenopausal)   . Hormone replacement therapy (postmenopausal)   . Hyperlipidemia   . Hypertension   . Hypertensive urgency 07/11/2018  . Hypokalemia 07/11/2018  . Malnutrition of moderate degree ([place]) 03/18/2014  . Nausea & vomiting 07/11/2018  . Normal cardiac stress test    pt can't remember when or where  . Obesity, unspecified 12/27/2012  . Rheumatoid arthritis(714.0) 09/28/2012  . Sepsis ([place]) 03/16/2014  . SIRS (systemic inflammatory response syndrome) ([place]) 07/11/2018  . 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 Unspecified essential hypertension 05/04/2013    Past Surgical History:  Procedure Laterality Date  . ABDOMINAL HYSTERECTOMY    . CHOLECYSTECTOMY N/A 05/04/2013   Procedure: LAPAROSCOPIC CHOLECYSTECTOMY WITH INTRAOPERATIVE CHOLANGIOGRAM;  Surgeon: Haywood Lasso, MD;  Location: [facility];  Service: General;  Laterality: N/A;  . ERCP N/A 05/02/2013   Procedure: ENDOSCOPIC RETROGRADE CHOLANGIOPANCREATOGRAPHY (ERCP);  Surgeon: Beryle Beams, MD;  Location: [facility];  Service: Endoscopy;  Laterality: N/A;  . JOINT REPLACEMENT     left  knee  . NASAL SINUS SURGERY    . SPHINCTEROTOMY  05/02/2013   Procedure: SPHINCTEROTOMY;  Surgeon: Beryle Beams, MD;  Location: [facility];  Service: Endoscopy;;  . TONSILLECTOMY    . TOTAL KNEE ARTHROPLASTY  08/01/2012   Procedure: TOTAL KNEE ARTHROPLASTY;  Surgeon: Meredith Pel, MD;  Location: [facility];  Service: Orthopedics;  Laterality: Right;  Right total knee arthroplasty  . TOTAL KNEE ARTHROPLASTY  08/01/2012    RIGHT  KNEE     Home Medications:  Prior to Admission medications   Medication Sig Start Date End Date Taking? Authorizing Provider  amLODipine (NORVASC) 5 MG tablet TAKE 1 TABLET BY MOUTH EVERY DAY Patient taking differently: Take 5 mg by mouth daily.  01/08/19  Yes Susy Frizzle, MD  aspirin EC 81 MG tablet Take 81 mg by mouth daily.   Yes [provider]  atorvastatin (LIPITOR) 10 MG tablet TAKE 1 TABLET BY MOUTH EVERY DAY Patient taking differently: Take 10 mg by mouth daily.  02/06/19  Yes Delsa Grana, PA-C  cloNIDine (CATAPRES) 0.3 MG tablet Take 1 tablet (0.3 mg total) by mouth 2 (two) times daily. 08/09/18  Yes Delsa Grana, PA-C  estradiol (ESTRACE) 0.5 MG tablet Take 0.5 mg by mouth 2 (two) times a day.   Yes [provider]  etanercept (ENBREL SURECLICK) 50 MG/ML injection INJECT 50 MG (0.98 ML) UNDER THE SKIN ONCE A WEEK Patient taking differently: Inject 50 mg into the skin every Saturday at 6 PM.  07/25/18  Yes Deveshwar, Abel Presto, MD  folic acid (FOLVITE) 1 MG tablet Take 1 tablet (1 mg total) by mouth 2 (two) times daily. 12/06/18  Yes Delsa Grana, PA-C  leflunomide (ARAVA) 10 MG tablet TAKE 1 TABLET BY MOUTH EVERY DAY Patient taking differently: Take 10 mg by mouth daily.  02/12/19  Yes Deveshwar, Abel Presto, MD  levothyroxine (SYNTHROID) 25 MCG tablet Take 1 tab PO 1 hour before breakfast x 2 weeks, then take 2 tabs PO before breakfast daily Patient taking differently: Take 25-50 mcg by mouth See admin instructions. Take 25 mcg by mouth one hour before breakfast for 2 weeks, then increase to 50 mcg daily 02/14/19  Yes Delsa Grana, PA-C  losartan-hydrochlorothiazide (HYZAAR) 100-12.5 MG tablet TAKE 1 TABLET BY MOUTH EVERY DAY Patient taking differently: Take 1 tablet by mouth daily.  12/05/18  Yes Delsa Grana, PA-C  magnesium oxide (MAG-OX) 400 MG tablet Take 1 tablet (400 mg total) by mouth 2 (two) times daily. Take 2 tablets 400mg  (total 800mg ) in the morning,  and 400mg  in the evening Patient taking differently: Take 400 mg by mouth daily.  02/06/19  Yes Delsa Grana, PA-C  metFORMIN (GLUCOPHAGE) 500 MG tablet TAKE 1 TABLET (500 MG TOTAL) BY MOUTH 2 (TWO) TIMES DAILY WITH A MEAL. 02/05/19  Yes , Modena Nunnery, MD  metoprolol tartrate (LOPRESSOR) 25 MG tablet Take 0.5 tablets (12.5 mg total) by mouth 2 (two) times daily. 10/25/18  Yes Delsa Grana, PA-C  omeprazole (PRILOSEC) 20 MG capsule TAKE 1 CAPSULE BY MOUTH EVERY DAY Patient taking differently: Take 20 mg by mouth daily before breakfast.  02/28/19  Yes Delsa Grana, PA-C  Calcium Carbonate-Vitamin D (CALCIUM 600 + D PO) Take 1 tablet by mouth 2 (two) times daily.    [provider]  cyanocobalamin 500 MCG tablet Take 1 tablet (500 mcg total) by mouth daily. 03/20/14   Caren Griffins, MD  diclofenac sodium (VOLTAREN) 1 % GEL Apply 3 g topically daily  as needed (to painful sites (in 3 large joints)).     [provider]  Ferrous Sulfate (IRON) 325 (65 Fe) MG TABS Take 325 mg by mouth daily.     [provider]  fish oil-omega-3 fatty acids 1000 MG capsule Take 1 g by mouth at bedtime.     [provider]  methocarbamol (ROBAXIN) 500 MG tablet Take 1 tablet (500 mg total) by mouth daily as needed for muscle spasms. 02/28/19   Ofilia Neas, PA-C  Multiple Vitamins-Minerals (MULTIVITAMINS THER. W/MINERALS) TABS Take 1 tablet by mouth daily.    [provider]    Inpatient Medications: Scheduled Meds: . aspirin EC  81 mg Oral Daily  . atorvastatin  10 mg Oral Daily  . heparin  3,500 Units Intravenous Once  . leflunomide  10 mg Oral Daily  . [START ON 03/04/2019] levothyroxine  25 mcg Oral Q0600  . metoprolol tartrate  12.5 mg Oral Q6H  . ondansetron (ZOFRAN) IV  4 mg Intravenous Q6H   Continuous Infusions: . sodium chloride 1,000 mL (03/03/19 1257)  . diltiazem (CARDIZEM) infusion 15 mg/hr (03/03/19 1809)  . heparin    . magnesium sulfate bolus IVPB     . potassium chloride     PRN Meds: acetaminophen **OR** acetaminophen, ondansetron  Allergies:    Allergies  Allergen Reactions  . Magnesium-Containing Compounds Nausea Only and Other (See Comments)    Re: Mag-Ox at higher doses caused a lot of stomach upset (dose had to be decreased)  . Monopril [Fosinopril] Cough  . Norvasc [Amlodipine Besylate] Other (See Comments)    10 mg dose caused LE edema- can tolerate 5 mg dose    Social History:   Social History   Socioeconomic History  . Marital status: Divorced    Spouse name: Not on file  . Number of children: Not on file  . Years of education: Not on file  . Highest education level: Not on file  Occupational History  . Not on file  Social Needs  . Financial resource strain: Not on file  . Food insecurity    Worry: Not on file    Inability: Not on file  . Transportation needs    Medical: Not on file    Non-medical: Not on file  Tobacco Use  . Smoking status: Former Smoker    Packs/day: 0.25    Years: 2.00    Pack years: 0.50    Types: Cigarettes  . Smokeless tobacco: Never Used  Substance and Sexual Activity  . Alcohol use: Yes    Comment: occasional  . Drug use: Never  . Sexual activity: Never  Lifestyle  . Physical activity    Days per week: Not on file    Minutes per session: Not on file  . Stress: Not on file  Relationships  . Social Herbalist on phone: Not on file    Gets together: Not on file    Attends religious service: Not on file    Active member of club or organization: Not on file    Attends meetings of clubs or organizations: Not on file    Relationship status: Not on file  . Intimate partner violence    Fear of current or ex partner: Not on file    Emotionally abused: Not on file    Physically abused: Not on file    Forced sexual activity: Not on file  Other Topics Concern  . Not on file  Social History Narrative  . Not on file    Family History:    Family History  Problem  Relation Age of Onset  . Cancer Mother        breast  . Heart attack Mother   . Stroke Father   . Diabetes Sister   . Diabetes Daughter      ROS:  Please see the history of present illness.  All other ROS reviewed and negative.     Physical Exam/Data:   Vitals:   03/03/19 1621 03/03/19 1633 03/03/19 1720 03/03/19 1950  BP: 140/80 140/80 (!) 145/100   Pulse: (!) 126  (!) 54   Resp:  (!) 24 20   Temp: 98.5 F (36.9 C)   98.5 F (36.9 C)  TempSrc: Oral   Oral  SpO2: 94%  95%   Weight: 72.6 kg     Height: 4\' 10"  (1.473 m)      No intake or output data in the 24 hours ending 03/03/19 2006 Last 3 Weights 03/03/2019 02/28/2019 01/30/2019  Weight (lbs) 160 lb 1.6 oz 157 lb 9.6 oz 161 lb 8 oz  Weight (kg) 72.621 kg 71.487 kg 73.256 kg     Body mass index is 33.46 kg/m.  General:  Well nourished, well developed, in no acute distress HEENT: normal Neck: no JVD Cardiac:  Tachycardic and irregularly irregular. II/VI systolic murmur Lungs:  clear to auscultation bilaterally, no wheezing, rhonchi or rales  Abd: soft, non-tender  Ext: no edema Musculoskeletal:  No deformities, BUE and BLE strength normal and equal Skin: warm and dry  Neuro:  No focal abnormalities noted Psych:  Normal affect   EKG:  The EKG was personally reviewed and demonstrates:  AFRVR HR 180s with occasional PVCs. Poor R wave progression. Nonspecific ST changes   Telemetry:  Telemetry was personally reviewed and demonstrates:  AFRVR HR 110-160s  Relevant CV Studies: None  Laboratory Data:  High Sensitivity Troponin:   Recent Labs  Lab 03/03/19 1125 03/03/19 1618 03/03/19 1826  TROPONINIHS 64* 78* 93*     Cardiac EnzymesNo results for input(s): TROPONINI in the last 168 hours. No results for input(s): TROPIPOC in the last 168 hours.  Chemistry Recent Labs  Lab 03/03/19 1125 03/03/19 1138  NA 138 137  K 3.0* 3.0*  CL 101  --   CO2 22  --   GLUCOSE 190*  --   BUN 32*  --   CREATININE 1.07*  --    CALCIUM 8.7*  --   GFRNONAA 51*  --   GFRAA 59*  --   ANIONGAP 15  --     Recent Labs  Lab 03/03/19 1125  PROT 6.7  ALBUMIN 3.3*  AST 60*  ALT 40  ALKPHOS 113  BILITOT 1.2   Hematology Recent Labs  Lab 03/03/19 1125 03/03/19 1138  WBC 12.1*  --   RBC 5.06  --   HGB 16.2* 16.7*  HCT 48.7* 49.0*  MCV 96.2  --   MCH 32.0  --   MCHC 33.3  --   RDW 14.4  --   PLT 237  --    BNP Recent Labs  Lab 03/03/19 1125  BNP 341.9*    DDimer No results for input(s): DDIMER in the last 168 hours.   Radiology/Studies:  Dg Orthopantogram  Result Date: 03/03/2019 CLINICAL DATA:  Recent fall with jaw pain, initial encounter EXAM: ORTHOPANTOGRAM/PANORAMIC COMPARISON:  None. FINDINGS: Panoramic view of the mandible was obtained and reveals no acute  fracture. No temporomandibular joint dislocation is seen. No acute dental abnormality is noted. Dental caries are seen particularly in the maxillary molars. IMPRESSION: No acute bony abnormality noted. Electronically Signed   By: Inez Catalina M.D.   On: 03/03/2019 18:05   Dg Cervical Spine With Flex & Extend  Result Date: 02/28/2019 CLINICAL DATA:  Increasing neck pain over the past month. EXAM: CERVICAL SPINE COMPLETE WITH FLEXION AND EXTENSION VIEWS COMPARISON:  03/04/2016 FINDINGS: Degenerative cervical spondylosis with multilevel disc disease and facet disease most significant at C4-5, C5-6 and C6-7. Mild reversal of the normal cervical lordosis likely due to the degenerative disease but could also be due to positioning, muscle spasm or pain. Mild posterior subluxation of C5 due to facet disease. No acute cervical spine fracture or worrisome bone lesion. No abnormal prevertebral soft tissue swelling. The oblique films demonstrate normally aligned facets and no significant bony foraminal stenosis. Flexion and extension films demonstrated limited range of motion, particularly with extension. In neutral is approximately 2.2 mm of anterior  subluxation of C4 compared to C5. This is 2.5 mm with flexion and 1.7 mm with extension. The C1-2 articulations are maintained. The lung apices are grossly clear. Stable appearing carotid artery calcifications. IMPRESSION: 1. Progressive degenerative cervical spondylosis with multilevel disc disease and facet disease. 2. No acute bony findings or worrisome bone lesions. 3. Limited range of motion. Minimal subluxation at C4-5 with flexion and extension. Electronically Signed   By: Marijo Sanes M.D.   On: 02/28/2019 13:41   Dg Chest Port 1 View  Result Date: 03/03/2019 CLINICAL DATA:  Atrial fibrillation. EXAM: PORTABLE CHEST 1 VIEW COMPARISON:  Chest radiograph June 03, 2016 FINDINGS: Cardiomediastinal silhouette is normal. Mediastinal contours appear intact. Calcific atherosclerotic disease of the aorta. There is no evidence of focal airspace consolidation, pleural effusion or pneumothorax. Osseous structures are without acute abnormality. Soft tissues are grossly normal. IMPRESSION: No active disease. Electronically Signed   By: Fidela Salisbury M.D.   On: 03/03/2019 12:29   Dg Knee Complete 4 Views Left  Result Date: 03/03/2019 CLINICAL DATA:  Pain after fall EXAM: LEFT KNEE - COMPLETE 4+ VIEW COMPARISON:  None. FINDINGS: The patient is status post left knee replacement. Hardware is in good position. Vascular calcifications. Small joint effusion. There is a cortical defect in the distal femur adjacent to the lateral aspect of the femoral component of the knee replacement. No other evidence of fracture. IMPRESSION: 1. There is a cortical defect in the distal femur adjacent to the lateral aspect of the femoral component of the knee replacement. This is an age-indeterminate finding. A subtle acute fracture in this region is not excluded. No other evidence of fracture. Electronically Signed   By: Dorise Bullion III M.D   On: 03/03/2019 18:07    Assessment and Plan:   AFRVR The patient is admitted  for workup following a syncopal episode that occurred in the setting of dehydration. She is noted to have newly diagnosed AFRVR on admission. She has no known cardiac history but has risk factors for AF including age, HTN, obesity, DM. TSH wnl. She is asymptomatic despite her high HR, so the chronicity of this arrhythmia is unclear. Her rates have slowly improved with IVF and IV diltiazem, although she remains tachycardic despite maximal dose of the diltiazem infusion. Anticipate that her rates will continue to improve with ongoing resuscitation. Otherwise, she would benefit from chronic anticoagulation for CHADS2VASC of at least 88 (age 17-74, HTN, DM2, female gender). -Adding short  acting metoprolol, which can be further titrated as needed for rate control and consolidated on discharge -Continue diltiazem gtt. Wean as tolerated once HR stable -Can consider TEE/DCCV prior to discharge -Agree with anticoagulation. Can start DOAC of choice per pt/insurance. Will likely not need ASA in addition to anticoagulation. -Agree with echocardiogram -Should be evaluated for OSA  Syncope Suspect the cause of the patient's episode to be related to hypovolemia/orthostatsis in the setting of her GI losses. Primary cardiac etiology is less likely. -Continue telemetry as above. -Echocardiogram as above  Elevated troponin The patient was noted to have a mildly elevated troponin on admission, which was mildly increased on repeat check. Suspect the cause of this elevated troponin to be due to demand ischemia in the setting of her AF and GI illness. She has no known CAD, and her presentation is not typical for ACS. -Can trend until stability is ensured. -Echocardiogram as above -Can consider stress test as outpatient  HTN BP elevated on admission -Continue diltiazem as above -Starting metoprolol as above (notably home dose is 12.5 BID) -Agree with holding losartan-HCTZ in the setting of hypovolemia  -On clonidine  at home. Would not abruptly stop this medication to avoid rebound.   HLD -continue atorvastatin       For questions or updates, please contact [place] Please consult [email or web address] for contact info under     Signed, Nila Nephew, MD  03/03/2019 8:06 PM

## 2019-03-03 NOTE — ED Provider Notes (Signed)
[facility] EMERGENCY DEPARTMENT Provider Note   CSN: [id number] Arrival date & time: 03/03/19  1104    History   Chief Complaint Chief Complaint  Patient presents with   Emesis   Tachycardia    HPI [name] is a 73 y.o. female.     HPI Patient reports she started having repeat episodes of vomiting and diarrhea 2 days ago.  Each day it was multiple episodes too numerous to count.  She denies she is had much pain in association with this.  She is had just a slight discomfort in the abdomen occasionally.  Reports she however has gotten very weak and now anytime she tries to stand up she feels like she is going to pass out.  She went into the bathroom today and while standing became so lightheaded that she did briefly pass out.  Denies she had any injury associated with that.  She denies that she has any sick contacts but she does live with her 2 grandchildren who are in their 46s and 58s and then her great grandchildren who are younger.  He lives in the lower levels her house and they live in the upper level.  Reports she has rheumatoid arthritis but and basically has been feeling good and not felt sick until this started.  Does not think she has had any sick contacts.  No cardiac history that she is aware of.  Reports she has been having some pain in the back of her head that seemingly preceded much of these symptoms.  No fevers no visual changes no incoordination.  She reports due to the severity of her vomiting she has not been to take any of her medications for couple of days. Past Medical History:  Diagnosis Date   Arthritis    osteoarthritis   Bursitis of right shoulder    Chronic cholecystitis with calculus 05/04/2013   Diabetes mellitus without complication ([place])    Type 2 NIDDM x 15 years   Diabetes type 2, controlled ([place]) 05/04/2013   Fatty liver disease, nonalcoholic 4401   hospitalized, MRCP dx fatty liver   GERD (gastroesophageal reflux  disease)    History of hypertension 08/20/2016   Hormone replacement therapy (postmenopausal)    Hormone replacement therapy (postmenopausal)    Hyperlipidemia    Hypertension    Hypertensive urgency 07/11/2018   Hypokalemia 07/11/2018   Malnutrition of moderate degree ([place]) 03/18/2014   Nausea & vomiting 07/11/2018   Normal cardiac stress test    pt can't remember when or where   Obesity, unspecified 12/27/2012   Rheumatoid arthritis(714.0) 09/28/2012   Sepsis ([place]) 03/16/2014   SIRS (systemic inflammatory response syndrome) ([place]) 07/11/2018   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   Unspecified essential hypertension 05/04/2013    Patient Active Problem List   Diagnosis Date Noted   Diabetes mellitus type 2, uncomplicated ([place]) 02/72/5366   History of total bilateral knee replacement 06/22/2017   High risk medication use 08/20/2016   Primary osteoarthritis of both hands 08/20/2016   Hypomagnesemia 07/21/2016   Thyroid nodule 03/05/2016   Diabetic eye exam ([place]) 10/15/2014   Multiple pulmonary nodules 04/04/2014   Anemia 11/21/2013   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   Bursitis of right shoulder    GERD (gastroesophageal reflux disease)    Morbid obesity due to excess calories ([place]) 12/27/2012   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 without organ or systems involvement ([place])    Essential hypertension, benign 10/14/2012   Hyperlipidemia  Fatty liver disease, nonalcoholic    Osteoarthritis of right knee 08/05/2012    Past Surgical History:  Procedure Laterality Date   ABDOMINAL HYSTERECTOMY     CHOLECYSTECTOMY N/A 05/04/2013   Procedure: LAPAROSCOPIC CHOLECYSTECTOMY WITH INTRAOPERATIVE CHOLANGIOGRAM;  Surgeon: Haywood Lasso, MD;  Location: [facility];  Service: General;  Laterality: N/A;   ERCP N/A 05/02/2013   Procedure: ENDOSCOPIC RETROGRADE CHOLANGIOPANCREATOGRAPHY (ERCP);  Surgeon: Beryle Beams, MD;   Location: [facility];  Service: Endoscopy;  Laterality: N/A;   JOINT REPLACEMENT     left knee   NASAL SINUS SURGERY     SPHINCTEROTOMY  05/02/2013   Procedure: SPHINCTEROTOMY;  Surgeon: Beryle Beams, MD;  Location: [facility];  Service: Endoscopy;;   TONSILLECTOMY     TOTAL KNEE ARTHROPLASTY  08/01/2012   Procedure: TOTAL KNEE ARTHROPLASTY;  Surgeon: Meredith Pel, MD;  Location: [place];  Service: Orthopedics;  Laterality: Right;  Right total knee arthroplasty   TOTAL KNEE ARTHROPLASTY  08/01/2012   RIGHT  KNEE     OB History   No obstetric history on file.      Home Medications    Prior to Admission medications   Medication Sig Start Date End Date Taking? Authorizing Provider  amLODipine (NORVASC) 5 MG tablet TAKE 1 TABLET BY MOUTH EVERY DAY 01/08/19   Susy Frizzle, MD  aspirin EC 81 MG tablet Take 81 mg by mouth daily.    [provider]  atorvastatin (LIPITOR) 10 MG tablet TAKE 1 TABLET BY MOUTH EVERY DAY 02/06/19   Delsa Grana, PA-C  Calcium Carbonate-Vitamin D (CALCIUM 600 + D PO) Take 1 tablet by mouth 2 (two) times daily.    [provider]  cloNIDine (CATAPRES) 0.3 MG tablet Take 1 tablet (0.3 mg total) by mouth 2 (two) times daily. 08/09/18   Delsa Grana, PA-C  cyanocobalamin 500 MCG tablet Take 1 tablet (500 mcg total) by mouth daily. 03/20/14   Caren Griffins, MD  diclofenac sodium (VOLTAREN) 1 % GEL Apply 3 g topically daily as needed (pain). To 3 large joints    [provider]  etanercept (ENBREL SURECLICK) 50 MG/ML injection INJECT 50 MG (0.98 ML) UNDER THE SKIN ONCE A WEEK 07/25/18   Bo Merino, MD  Ferrous Sulfate (IRON) 325 (65 Fe) MG TABS Take 1 tablet by mouth daily.     [provider]  fish oil-omega-3 fatty acids 1000 MG capsule Take 1 g by mouth at bedtime.     [provider]  folic acid (FOLVITE) 1 MG tablet Take 1 tablet (1 mg total) by mouth 2 (two) times daily. 12/06/18   Delsa Grana, PA-C  leflunomide (ARAVA) 10 MG tablet TAKE 1 TABLET BY MOUTH EVERY DAY 02/12/19   Bo Merino, MD  levothyroxine (SYNTHROID) 25 MCG tablet Take 1 tab PO 1 hour before breakfast x 2 weeks, then take 2 tabs PO before breakfast daily 02/14/19   Delsa Grana, PA-C  losartan-hydrochlorothiazide (HYZAAR) 100-12.5 MG tablet TAKE 1 TABLET BY MOUTH EVERY DAY 12/05/18   Delsa Grana, PA-C  magnesium oxide (MAG-OX) 400 MG tablet Take 1 tablet (400 mg total) by mouth 2 (two) times daily. Take 2 tablets 400mg  (total 800mg ) in the morning, and 400mg  in the evening Patient not taking: Reported on 02/28/2019 02/06/19   Delsa Grana, PA-C  metFORMIN (GLUCOPHAGE) 500 MG tablet TAKE 1 TABLET (500 MG TOTAL) BY MOUTH 2 (TWO) TIMES DAILY WITH A MEAL. 02/05/19   Alycia Rossetti,  MD  methocarbamol (ROBAXIN) 500 MG tablet Take 1 tablet (500 mg total) by mouth daily as needed for muscle spasms. 02/28/19   Ofilia Neas, PA-C  metoprolol tartrate (LOPRESSOR) 25 MG tablet Take 0.5 tablets (12.5 mg total) by mouth 2 (two) times daily. 10/25/18   Delsa Grana, PA-C  Multiple Vitamins-Minerals (MULTIVITAMINS THER. W/MINERALS) TABS Take 1 tablet by mouth daily.    [provider]  omeprazole (PRILOSEC) 20 MG capsule TAKE 1 CAPSULE BY MOUTH EVERY DAY 02/28/19   Delsa Grana, PA-C    Family History Family History  Problem Relation Age of Onset   Cancer Mother        breast   Heart attack Mother    Stroke Father    Diabetes Sister    Diabetes Daughter     Social History Social History   Tobacco Use   Smoking status: Former Smoker    Packs/day: 0.25    Years: 2.00    Pack years: 0.50    Types: Cigarettes   Smokeless tobacco: Never Used  Substance Use Topics   Alcohol use: Yes    Comment: occasional   Drug use: Never     Allergies   Monopril [fosinopril] and Norvasc [amlodipine besylate]   Review of Systems Review of Systems 10 Systems reviewed and are negative for acute change except  as noted in the HPI.   Physical Exam Updated Vital Signs BP (!) 138/96    Pulse (!) 109    SpO2 94%   Physical Exam Constitutional:      Comments: Patient is alert and interactive.  Mental status is clear.  She does not have respiratory distress at rest.  Color is good.  Patient does not appear excessively fatigued or weak at rest in the stretcher.  HENT:     Mouth/Throat:     Mouth: Mucous membranes are moist.     Pharynx: Oropharynx is clear.  Eyes:     Extraocular Movements: Extraocular movements intact.     Conjunctiva/sclera: Conjunctivae normal.     Pupils: Pupils are equal, round, and reactive to light.  Neck:     Musculoskeletal: Neck supple.  Cardiovascular:     Comments: Tachycardia irregularly irregular Pulmonary:     Effort: Pulmonary effort is normal.     Breath sounds: Normal breath sounds.  Abdominal:     General: There is no distension.     Palpations: Abdomen is soft.     Tenderness: There is no abdominal tenderness. There is no guarding.  Musculoskeletal: Normal range of motion.        General: No swelling or tenderness.     Right lower leg: No edema.     Left lower leg: No edema.  Skin:    General: Skin is warm and dry.  Neurological:     General: No focal deficit present.     Mental Status: She is oriented to person, place, and time.     Coordination: Coordination normal.  Psychiatric:        Mood and Affect: Mood normal.      ED Treatments / Results  Labs (all labs ordered are listed, but only abnormal results are displayed) Labs Reviewed  COMPREHENSIVE METABOLIC PANEL - Abnormal; Notable for the following components:      Result Value   Potassium 3.0 (*)    Glucose, Bld 190 (*)    BUN 32 (*)    Creatinine, Ser 1.07 (*)    Calcium 8.7 (*)  Albumin 3.3 (*)    AST 60 (*)    GFR calc non Af Amer 51 (*)    GFR calc Af Amer 59 (*)    All other components within normal limits  CBC WITH DIFFERENTIAL/PLATELET - Abnormal; Notable for the  following components:   WBC 12.1 (*)    Hemoglobin 16.2 (*)    HCT 48.7 (*)    Neutro Abs 9.2 (*)    Monocytes Absolute 1.3 (*)    All other components within normal limits  MAGNESIUM - Abnormal; Notable for the following components:   Magnesium 1.1 (*)    All other components within normal limits  BRAIN NATRIURETIC PEPTIDE - Abnormal; Notable for the following components:   B Natriuretic Peptide 341.9 (*)    All other components within normal limits  POCT I-STAT EG7 - Abnormal; Notable for the following components:   pH, Ven 7.525 (*)    pCO2, Ven 33.4 (*)    pO2, Ven 164.0 (*)    Acid-Base Excess 5.0 (*)    Potassium 3.0 (*)    Calcium, Ion 0.88 (*)    HCT 49.0 (*)    Hemoglobin 16.7 (*)    All other components within normal limits  TROPONIN I (HIGH SENSITIVITY) - Abnormal; Notable for the following components:   Troponin I (High Sensitivity) 64 (*)    All other components within normal limits  CULTURE, BLOOD (ROUTINE X 2)  CULTURE, BLOOD (ROUTINE X 2)  SARS CORONAVIRUS 2 (HOSPITAL ORDER, [place] [facility])  GASTROINTESTINAL PANEL BY PCR, STOOL (REPLACES STOOL CULTURE)  C DIFFICILE QUICK SCREEN W PCR REFLEX  LIPASE, BLOOD  PROTIME-INR  PHOSPHORUS  LACTIC ACID, PLASMA  LACTIC ACID, PLASMA  URINALYSIS, ROUTINE W REFLEX MICROSCOPIC  RAPID URINE DRUG SCREEN, HOSP PERFORMED  TSH    EKG EKG Interpretation  Date/Time:  Saturday March 03 2019 11:13:17 EDT Ventricular Rate:  184 PR Interval:    QRS Duration: 82 QT Interval:  251 QTC Calculation: 443 R Axis:   -24 Text Interpretation:  Atrial fibrillation with rapid V-rate Ventricular premature complex Borderline left axis deviation Anterior infarct, old Repolarization abnormality, prob rate related agree. Confirmed by Charlesetta Shanks [phone]) on 03/03/2019 11:21:32 AM   Radiology Dg Chest Port 1 View  Result Date: 03/03/2019 CLINICAL DATA:  Atrial fibrillation. EXAM: PORTABLE CHEST 1 VIEW  COMPARISON:  Chest radiograph June 03, 2016 FINDINGS: Cardiomediastinal silhouette is normal. Mediastinal contours appear intact. Calcific atherosclerotic disease of the aorta. There is no evidence of focal airspace consolidation, pleural effusion or pneumothorax. Osseous structures are without acute abnormality. Soft tissues are grossly normal. IMPRESSION: No active disease. Electronically Signed   By: Fidela Salisbury M.D.   On: 03/03/2019 12:29    Procedures Procedures (including critical care time) CRITICAL CARE Performed by: Charlesetta Shanks   Total critical care time: 30 minutes  Critical care time was exclusive of separately billable procedures and treating other patients.  Critical care was necessary to treat or prevent imminent or life-threatening deterioration.  Critical care was time spent personally by me on the following activities: development of treatment plan with patient and/or surrogate as well as nursing, discussions with consultants, evaluation of patient's response to treatment, examination of patient, obtaining history from patient or surrogate, ordering and performing treatments and interventions, ordering and review of laboratory studies, ordering and review of radiographic studies, pulse oximetry and re-evaluation of patient's condition. Medications Ordered in ED Medications  diltiazem (CARDIZEM) 1 mg/mL load via infusion  15 mg (15 mg Intravenous Bolus from Bag 03/03/19 1257)    And  diltiazem (CARDIZEM) 100 mg in dextrose 5% 114mL (1 mg/mL) infusion (5 mg/hr Intravenous New Bag/Given 03/03/19 1257)  potassium chloride 10 mEq in 100 mL IVPB (has no administration in time range)  sodium chloride 0.9 % bolus 1,000 mL (1,000 mLs Intravenous New Bag/Given 03/03/19 1258)    Followed by  0.9 %  sodium chloride infusion (1,000 mLs Intravenous New Bag/Given 03/03/19 1257)  sodium chloride 0.9 % bolus 1,000 mL (1,000 mLs Intravenous New Bag/Given 03/03/19 1151)  potassium  chloride SA (K-DUR) CR tablet 40 mEq (40 mEq Oral Given 03/03/19 1259)     Initial Impression / Assessment and Plan / ED Course  I have reviewed the triage vital signs and the nursing notes.  Pertinent labs & imaging results that were available during my care of the patient were reviewed by me and considered in my medical decision making (see chart for details).  Clinical Course as of Mar 02 1258  Sat Mar 03, 2019  1259 [place]: Reviewed with medical teaching service for admission.   [MP]    Clinical Course User Index [MP] Charlesetta Shanks, MD      Patient has had 2 days of frequent vomiting and diarrhea.  He describes orthostatic hypotension at home with a near syncope episode.  Patient is in A. fib RVR.  She does not perceive palpitations, chest pain or shortness of breath.  Symptoms were brought on with position changes but otherwise not feeling lightheaded or near syncopal.  Appears consistent with gastroenteritis with significant dehydration and comorbid atrial fibrillation RVR without history of same.  Rehydration is being initiated with rate control.  Patient is alert with clear mental status, she does not have respiratory distress or subjective dyspnea, chest x-ray is not exhibiting CHF.  At this time plan will be for admission for continued hydration and management of A. fib RVR.  Final Clinical Impressions(s) / ED Diagnoses   Final diagnoses:  Dehydration  Atrial fibrillation with rapid ventricular response [facility])  Gastroenteritis    ED Discharge Orders    None       Charlesetta Shanks, MD 03/03/19 1301

## 2019-03-03 NOTE — Discharge Summary (Addendum)
[facility] Discharge Summary  Patient name: [name] Medical record number: [id number] Date of birth: 09/02/1945 Age: 73 y.o. Gender: female Date of Admission: 03/03/2019  Date of Discharge: 03/07/19 Admitting Physician: Leeanne Rio, MD  Primary Care Provider: Delsa Grana, PA-C Consultants: Cardio  Indication for Hospitalization: Syncope  Discharge Diagnoses/Problem List:  New onset A fib with RVR Syncope HTN Hypokalemia-resolved  Hypomagnesemia-resolved Left knee fracture Rheumatoid arthritis  T2DM  Disposition: home  Discharge Condition:  stable  General: Alert and cooperative and appears to be in no acute distress HEENT: Neck non-tender without lymphadenopathy, masses or thyromegaly Cardio: Normal S1 and S2 plus Afib. Rhythm is irregular. No murmurs or rubs.   Pulm: Clear to auscultation bilaterally, no crackles, wheezing, or diminished breath sounds. Normal respiratory effort Abdomen: Bowel sounds normal. Abdomen soft and non-tender.  Extremities: No peripheral edema. Warm/ well perfused.  Strong radial. Calves soft non tender. Knee immobliser noted on left knee. Neuro: Cranial nerves grossly intact  Brief Hospital Course:  Syncope and Afib Patient presented to ED after a syncopal episode at her home. She had 3 days of gastroenteritis with vomiting ~8-10x per day, fatigue and low food/fluid intake. She was walking across her home when "everything started to go dark" and she woke up on the floor to her family (who were home at the time but did not witness the event). She was unsure how long she was out and did not recall fainting. When she woke up she was confused briefly but did not have any chest pain, bowel or bladder inconvenience, or bites on her tongue or mouth. She also denied focal symptoms at this time or known injury from the fall. In the ED she had high sensitivity troponins which showed an elevation of 64 initially followed  by 78 on repeat two hours later. Four more high sensitivity troponins were drawn which showed 93, 93, 51 and 28.  She also had an EKG which showed atrial fibrillation with RVR. For her Afibf RVR she was consulted by cardiology who initially had put her on IV heparin, diltiazem and metoprolol. Over the next few days they switched her to oral medications: 5mg  Apixaban BID, Diltiazem 360mg  and Metoprolol 50mg  BID. (Diltiazem and Metoprolol were increased gradually to these doses) Her HR was 98 on discharge. With regards to her syncope, her diarrhea and vomiting resolved and she had no further episodes of this. She has no further syncopal episodes in hospital.   Ortho In the ED she mentioned right sided jaw pain that she did not have before the fall as well as left sided knee pain. Xrays were taken which showed no acute fractures. Her left knee xray showed possible small fracture. Orthopaedics were consulted who advised a CT knee without contrast. This showed a possible tiny chip fracture off the lateral femur is not visible on this examination but could be obscured by streak artifact. However, there is no lipohemarthrosis as typically seen and knee fractures. A small joint effusion was also present. Ortho also recommended a knee immobilizer which she was weight bearing as able.    Issues for Follow Up:  1. New onset atrial fibrillation- started on Diltiazem 360 mg, Metoprolol 50 mg BID and Apixaban 5 mg BID. Please monitor patient's HR and rhythm in community, cardio also following 2. Please refer for OSA assessment as per cardiology advice 3. Consider stress testing on discharge per cardiology advice  4. Pt currently has rate controlled Afib. The plan is  hospital was to control her rate and then cardiovert her in 4 weeks time on discharge. Please ensure she has her cardioversion in 4 weeks time 5. Clonidine, amlodipine and hydrazaline discontinued as per cardiology advice due to antihypertensive effects of  Diltiazem and Metoprolol. Please monitor patient's BP and add antihypertensives if you feel necessary 6. Pt has left knee immobilizer on and is weight bearing on it. FU in 2 weeks if having continual knee pain with Dr Lonn Georgia.  Significant Procedures:  Nil  Significant Labs and Imaging:  Recent Labs  Lab 03/05/19 0021 03/06/19 0634 03/07/19 0542  WBC 8.1 11.1* 9.9  HGB 13.2 14.2 13.4  HCT 40.4 43.6 42.4  PLT 143* 174 178   Recent Labs  Lab 03/03/19 1125 03/03/19 1138 03/04/19 0501 03/04/19 1033 03/05/19 0021 03/06/19 0634  03/06/19 1529 03/07/19 0542  NA 138 137 137  --  138 138  --   --  139  K 3.0* 3.0* 3.2*  --  3.8 6.6*   < > 4.7 4.5  CL 101  --  104  --  107 109  --   --  108  CO2 22  --  23  --  23 19*  --   --  22  GLUCOSE 190*  --  137*  --  183* 126*  --   --  111*  BUN 32*  --  18  --  20 22  --   --  17  CREATININE 1.07*  --  0.79  --  0.95 0.85  --   --  0.87  CALCIUM 8.7*  --  7.6*  --  7.7* 8.9  --   --  9.4  MG 1.1*  --   --  1.4* 1.7  --   --   --   --   PHOS 3.0  --   --   --   --   --   --   --   --   ALKPHOS 113  --   --   --   --   --   --   --   --   AST 60*  --   --   --   --   --   --   --   --   ALT 40  --   --   --   --   --   --   --   --   ALBUMIN 3.3*  --   --   --   --   --   --   --   --    < > = values in this interval not displayed.     Results/Tests Pending at Time of Discharge:  Discharge Medications:  Allergies as of 03/07/2019      Reactions   Magnesium-containing Compounds Nausea Only, Other (See Comments)   Re: Mag-Ox at higher doses caused a lot of stomach upset (dose had to be decreased)   Monopril [fosinopril] Cough   Norvasc [amlodipine Besylate] Other (See Comments)   10 mg dose caused LE edema- can tolerate 5 mg dose      Medication List    STOP taking these medications   amLODipine 5 MG tablet Commonly known as: NORVASC   aspirin EC 81 MG tablet   cloNIDine 0.3 MG tablet Commonly known as: CATAPRES    losartan-hydrochlorothiazide 100-12.5 MG tablet Commonly known as: HYZAAR   magnesium oxide 400 MG tablet Commonly known as:  MAG-OX   omeprazole 20 MG capsule Commonly known as: PRILOSEC     TAKE these medications   apixaban 5 MG Tabs tablet Commonly known as: ELIQUIS Take 1 tablet (5 mg total) by mouth 2 (two) times daily.   atorvastatin 10 MG tablet Commonly known as: LIPITOR TAKE 1 TABLET BY MOUTH EVERY DAY What changed:   how much to take  how to take this  when to take this  additional instructions   CALCIUM 600 + D PO Take 1 tablet by mouth 2 (two) times daily.   diltiazem 360 MG 24 hr capsule Commonly known as: CARDIZEM CD Take 1 capsule (360 mg total) by mouth daily. Start taking on: March 08, 2019   estradiol 0.5 MG tablet Commonly known as: ESTRACE Take 0.5 mg by mouth 2 (two) times a day.   etanercept 50 MG/ML injection Commonly known as: Enbrel SureClick INJECT 50 MG (3.84 ML) UNDER THE SKIN ONCE A WEEK What changed:   how much to take  how to take this  when to take this  additional instructions   famotidine 20 MG tablet Commonly known as: PEPCID Take 1 tablet (20 mg total) by mouth daily. Start taking on: March 08, 2019   fish oil-omega-3 fatty acids 1000 MG capsule Take 1 g by mouth at bedtime.   folic acid 1 MG tablet Commonly known as: FOLVITE Take 1 tablet (1 mg total) by mouth 2 (two) times daily.   Iron 325 (65 Fe) MG Tabs Take 325 mg by mouth daily.   leflunomide 10 MG tablet Commonly known as: ARAVA TAKE 1 TABLET BY MOUTH EVERY DAY   levothyroxine 25 MCG tablet Commonly known as: SYNTHROID Take 1 tab PO 1 hour before breakfast x 2 weeks, then take 2 tabs PO before breakfast daily What changed:   how much to take  how to take this  when to take this  additional instructions   metFORMIN 500 MG tablet Commonly known as: GLUCOPHAGE TAKE 1 TABLET (500 MG TOTAL) BY MOUTH 2 (TWO) TIMES DAILY WITH A MEAL.    methocarbamol 500 MG tablet Commonly known as: ROBAXIN Take 1 tablet (500 mg total) by mouth daily as needed for muscle spasms.   metoprolol tartrate 50 MG tablet Commonly known as: LOPRESSOR Take 1 tablet (50 mg total) by mouth 2 (two) times daily. What changed:   medication strength  how much to take   multivitamins ther. w/minerals Tabs tablet Take 1 tablet by mouth daily.   vitamin B-12 500 MCG tablet Commonly known as: CYANOCOBALAMIN Take 1 tablet (500 mcg total) by mouth daily.   Voltaren 1 % Gel Generic drug: diclofenac sodium Apply 3 g topically daily as needed (to painful sites (in 3 large joints)).       Discharge Instructions: Please refer to Patient Instructions section of EMR for full details.  Patient was counseled important signs and symptoms that should prompt return to medical care, changes in medications, dietary instructions, activity restrictions, and follow up appointments.   Follow-Up Appointments: 1) Cardiology [facility] appointment with cardiology in 1 week 2) FU with Dr Berry-Cardiology in 8 weeks 3) Cardioversion as outpatient in 4 weeks  Lattie Haw, MD 03/07/2019, 4:46 PM PGY-1, [place]

## 2019-03-03 NOTE — ED Notes (Signed)
ED Provider at bedside. 

## 2019-03-03 NOTE — Progress Notes (Signed)
Pt arrived to unit from ED, on card gtt at 15, NS at 125. HR in 130-160s.

## 2019-03-03 NOTE — ED Notes (Signed)
Patient requesting something for nausea, Dr. Johnney Killian made aware

## 2019-03-03 NOTE — ED Triage Notes (Addendum)
Pt arrives via [facility] from home for c/o n/v/ gen weakness for the past few days. Daughter called out for syncopal episode, pt states she last remembers walking to the bathroom and then woke up on the floor. HR 180s with EMS, pt hypotensive in the 70s initially. Pt denies chest pain or sob. 520 cc normal saline, 4mg  zofran and 6mg  adenosine given en route, bp 111 now.

## 2019-03-03 NOTE — ED Notes (Signed)
This rn attempted a second Iv acces twice without success

## 2019-03-03 NOTE — H&P (Addendum)
[facility] Admission History and Physical Service Pager: [phone]  Patient name: [name] Medical record number: [id number] Date of birth: 1945/08/29 Age: 73 y.o. Gender: female  Primary Care Provider: Delsa Grana, PA-C Consultants: None Code Status: full Preferred Emergency Contact: Earvin Hansen (Granddaughter [phone])  Chief Complaint: Syncopal episode  Assessment and Plan: Sadi Arave is a 73 y.o. female presenting with syncopal episode. PMH is significant for T2DM, HTN, HLD, and RA.  Syncope- Patient afebrile, tachycardic both in the ambulance and ED with heart rates in the mid 120s-180s and hypertensive in the 130s-160s/100s on arrival. Report of tachycardia to 601 and systolic BP in 09N in ambulance and was given adenosine, zofran and 520cc NS. On exam- carotid sinus massage was attempted but no improvement in HR was observed. Patient was without chest pain, SOB, or other signs of distress. Her neuro exam was without any focal abnormalities. She was given 2L bolus with 132mL/hr NS and started on diltiazem in the ED in attempt to reduce her heart rate. On exam the lateral aspect of the right mandible is tender to palpation approximately halfway between the angle and the symphysis. She also has tenderness in the medial and anterior aspects of the her left knee. Patient states these were not painful before her episode. Given patient's history of poor PO intake and excessive fluid loss with vomiting and syncope associated with use of bathroom, this episode is certainly contributed by dehydration/orthostatic response. However, will need to rule out other complications. Differential includes seizure, ACS, demand ischemia, stroke, infection, medication effects, metabolic disturbances, and dehydration. Seizure less likely due to lack of bowel/bladder incontinence or biting of tongue/mouth though awaking in a confused state can boost this differential. ACS  should be considered due to an elevated high sensitivity troponin of 64>78 and new ECG irregularities- irregular rhythm at 184bpm, slight left axis deviation (Previous ECG 6 months ago shows sinus tach and multiple PAC's and PVC's). The patient being female and diabetic with known gastroparesis has a greater chance of ACS presenting atypically, however she denies chest pain or shortness of breath upon awaking and her jaw pain is on the right side with redness and tenderness to palpation, not left. Demand ischemia is a possibility due to the fluid volume status of the patient after her vomiting from apparent gastroenteritis and low intake, and could present with mildly elevated troponins. Stroke should also be considered however patient has a normal neuro exam and no longer is experiencing the confusion she had upon waking from her episode. Infection less likely since patient is afebrile, negative chest xray and negative urinary symptoms/urinalysis without sepsis criteria but WBC count is slightly elevated to 12.1 and has LA of 2.3. patient is also relatively immunocompromised with RA treatment. Medications reviewed and no new medications to effect patient but she could be experiencing some withdrawal as she has not been taking her medications for about 3 days with the vomiting. Metabolic disturbance less likely as patient is fully alert, negative UDS, TSH normal and had normal glucose 190 on admission with just mildly abnormal electrolytes of K+ 3.0, Ca++ 8.7.  - Admit to cardiac-tele, attending Dr. Ardelia Mems - continuous cardiac monitoring  - Follow troponins - Continue IV fluids - Rate control - BMP - orthostatic vital signs - repeat ECG when HR decreases - consult cards - Xray left knee and right jaw - follow blood cultures - replace K+ (3.0) - replace magnesium (1.1) - repeat LA  New onset A fib  with RVR- ECG 2017 had NSR. ECG 06/2018 has irregular rhythm with possible p waves. ecg on this  admission difficult to assess for p waves at rapid rate but the rhythm is irregular. Patient denies having any sensation of CP or palpitations. Could be contributing to syncopal episode as patient may have been hypoperfusing. Patient is not anticoagulated - consult cards, appreciate recs - consult pharm for heparin anticoagulation  - continue diltiazem drip - continue home metoprolol in IV and increase dose as needed for rapid HR - ECHO to assess structure  Gastroenteritis: (likely viral) Started 3 days ago, primarily vomiting (~8-10x/day). No diarrhea during this illness but diarrhea prior to it while on magnesium. No fevers or respiratory symptoms. No sick contacts. Most likely gastroenteritis due to lack of fever, nausea and vomiting for 3 days, WBC of 12 and lack of respiratory symptoms. - orthostatic vitals - zofran prn - replinish fluids. - CBC am - BMP am  Rheumatoid Arthritis: Home meds - Enbrel, Arava, Robaxin (PRN for muscle spasms), Per previous rheum note: +RF, +CCP, -ANA. Treated by Dr. Estanislado Pandy. - Continue home medications - tylenol PRN - hold home robaxin  T2DM- Hgb A1c 6.4 this admission which indicates dz is well controlled and less likely to be contributing factor to dulled ACS sensation. Home meds: metformin 500mg  BID - holding home metformin for now (500mg  BID) - CBGs daily  HTN- home meds: amlodipine 5mg , clonidine 0.3mg  BID, hyzaar 100-12.5, metoprolol 12.5mg  BID. Patient has not been taking meds for ~3 days 2/2 vomiting. - hold home meds (on dilt drip and IV metop) - metoprolol IV  FEN/GI: heart healthy, 186mL/hr NS, droplet precautions Prophylaxis: Heparin subc  Disposition: Pending medical workup  History of Present Illness:  [name] is a 73 y.o. female presenting with syncope.  Patient has had 2-3 days of gastroenteritis with vomiting 7-8x per day, low appetite, fatigue. She denies fever, diarrhea, palpitations or chest pain. She does admit to some  minor shortness of breath during this time while standing up from sitting. Today, she was going to the bathroom and while standing by the sink noticed "everything started to go black." As she tried to walk across her room she apparently fainted. She states she woke up on the floor after unknown time down and yelled for help. Her family did not witness the fall and called EMS. She was unable to remember her fall and was confused upon awaking, not able to answer questions. Did not have any bowel or bladder incontinence or noted bite marks on her tongue or mouth. She also denies hitting her head or other injury during this fall but does state that her left knee and right jaw are both painful in the ED. In the ambulance she says she was given "something" for her fast heart rate but was unsure of what it was. She was started on diltiazem in the ED as well as given fluids.   She denies history of abnormal heart rhythms or other heart issues. She admits to hiccups for 3 days, some shortness of breath upon standing without difficulty breathing otherwise, and nausea related to standing. She states she did have a bout of diarrhea a few weeks ago after starting magnesium for constipation but says this went away after discontinuing the magnesium.   Review Of Systems: Per HPI with the following additions:   Review of Systems  Constitutional: Negative for fever.  Respiratory: Positive for shortness of breath. Negative for cough.   Cardiovascular: Negative for  chest pain and palpitations.  Gastrointestinal: Positive for abdominal pain (mild stomach pain after vomiting) and vomiting (~8-10x day last 3 days). Negative for diarrhea.  Musculoskeletal: Positive for joint pain (anterior and medial aspects of left knee). Negative for neck pain.       Right jaw pain, tender on palpation.  Neurological: Positive for dizziness, loss of consciousness (brief syncopal episode) and weakness.    Patient Active Problem List    Diagnosis Date Noted  . Syncope and collapse 03/03/2019  . Diabetes mellitus type 2, uncomplicated ([place]) 96/29/5284  . History of total bilateral knee replacement 06/22/2017  . High risk medication use 08/20/2016  . Primary osteoarthritis of both hands 08/20/2016  . Hypomagnesemia 07/21/2016  . Thyroid nodule 03/05/2016  . Diabetic eye exam ([place]) 10/15/2014  . Multiple pulmonary nodules 04/04/2014  . Anemia 11/21/2013  . 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 Bursitis of right shoulder   . GERD (gastroesophageal reflux disease)   . Morbid obesity due to excess calories ([place]) 12/27/2012  . 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 without organ or systems involvement ([place])   . Essential hypertension, benign 10/14/2012  . Hyperlipidemia   . Fatty liver disease, nonalcoholic   . Osteoarthritis of right knee 08/05/2012    Past Medical History: Past Medical History:  Diagnosis Date  . Arthritis    osteoarthritis  . Bursitis of right shoulder   . Chronic cholecystitis with calculus 05/04/2013  . 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 Diabetes type 2, controlled ([place]) 05/04/2013  . Fatty liver disease, nonalcoholic 1324   hospitalized, MRCP dx fatty liver  . GERD (gastroesophageal reflux disease)   . History of hypertension 08/20/2016  . Hormone replacement therapy (postmenopausal)   . Hormone replacement therapy (postmenopausal)   . Hyperlipidemia   . Hypertension   . Hypertensive urgency 07/11/2018  . Hypokalemia 07/11/2018  . Malnutrition of moderate degree ([place]) 03/18/2014  . Nausea & vomiting 07/11/2018  . Normal cardiac stress test    pt can't remember when or where  . Obesity, unspecified 12/27/2012  . Rheumatoid arthritis(714.0) 09/28/2012  . Sepsis ([place]) 03/16/2014  . SIRS (systemic inflammatory response syndrome) ([place]) 07/11/2018  . 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 Unspecified essential hypertension  05/04/2013    Past Surgical History: Past Surgical History:  Procedure Laterality Date  . ABDOMINAL HYSTERECTOMY    . CHOLECYSTECTOMY N/A 05/04/2013   Procedure: LAPAROSCOPIC CHOLECYSTECTOMY WITH INTRAOPERATIVE CHOLANGIOGRAM;  Surgeon: Haywood Lasso, MD;  Location: [facility];  Service: General;  Laterality: N/A;  . ERCP N/A 05/02/2013   Procedure: ENDOSCOPIC RETROGRADE CHOLANGIOPANCREATOGRAPHY (ERCP);  Surgeon: Beryle Beams, MD;  Location: [facility];  Service: Endoscopy;  Laterality: N/A;  . JOINT REPLACEMENT     left knee  . NASAL SINUS SURGERY    . SPHINCTEROTOMY  05/02/2013   Procedure: SPHINCTEROTOMY;  Surgeon: Beryle Beams, MD;  Location: [facility];  Service: Endoscopy;;  . TONSILLECTOMY    . TOTAL KNEE ARTHROPLASTY  08/01/2012   Procedure: TOTAL KNEE ARTHROPLASTY;  Surgeon: Meredith Pel, MD;  Location: [facility];  Service: Orthopedics;  Laterality: Right;  Right total knee arthroplasty  . TOTAL KNEE ARTHROPLASTY  08/01/2012   RIGHT  KNEE    Social History: Social History   Tobacco Use  . Smoking status: Former Smoker    Packs/day: 0.25    Years: 2.00    Pack years: 0.50    Types:  Cigarettes  . Smokeless tobacco: Never Used  Substance Use Topics  . Alcohol use: Yes    Comment: occasional  . Drug use: Never   Additional social history: None Please also refer to relevant sections of EMR.  Family History: Family History  Problem Relation Age of Onset  . Cancer Mother        breast  . Heart attack Mother   . Stroke Father   . Diabetes Sister   . Diabetes Daughter      Allergies and Medications: Allergies  Allergen Reactions  . Monopril [Fosinopril] Cough  . Norvasc [Amlodipine Besylate] Other (See Comments)    10 mg dose causes LE edema.  Tolerates 5mg  dose.   No current facility-administered medications on file prior to encounter.    Current Outpatient Medications on File Prior to Encounter  Medication Sig Dispense Refill  . amLODipine  (NORVASC) 5 MG tablet TAKE 1 TABLET BY MOUTH EVERY DAY 90 tablet 1  . aspirin EC 81 MG tablet Take 81 mg by mouth daily.    Marland Kitchen atorvastatin (LIPITOR) 10 MG tablet TAKE 1 TABLET BY MOUTH EVERY DAY 90 tablet 3  . Calcium Carbonate-Vitamin D (CALCIUM 600 + D PO) Take 1 tablet by mouth 2 (two) times daily.    . cloNIDine (CATAPRES) 0.3 MG tablet Take 1 tablet (0.3 mg total) by mouth 2 (two) times daily. 180 tablet 0  . cyanocobalamin 500 MCG tablet Take 1 tablet (500 mcg total) by mouth daily. 30 tablet 0  . diclofenac sodium (VOLTAREN) 1 % GEL Apply 3 g topically daily as needed (pain). To 3 large joints    . etanercept (ENBREL SURECLICK) 50 MG/ML injection INJECT 50 MG (0.98 ML) UNDER THE SKIN ONCE A WEEK 12 Syringe 0  . Ferrous Sulfate (IRON) 325 (65 Fe) MG TABS Take 1 tablet by mouth daily.     . fish oil-omega-3 fatty acids 1000 MG capsule Take 1 g by mouth at bedtime.     . folic acid (FOLVITE) 1 MG tablet Take 1 tablet (1 mg total) by mouth 2 (two) times daily. 60 tablet 3  . leflunomide (ARAVA) 10 MG tablet TAKE 1 TABLET BY MOUTH EVERY DAY 90 tablet 0  . levothyroxine (SYNTHROID) 25 MCG tablet Take 1 tab PO 1 hour before breakfast x 2 weeks, then take 2 tabs PO before breakfast daily 90 tablet 0  . losartan-hydrochlorothiazide (HYZAAR) 100-12.5 MG tablet TAKE 1 TABLET BY MOUTH EVERY DAY 90 tablet 1  . magnesium oxide (MAG-OX) 400 MG tablet Take 1 tablet (400 mg total) by mouth 2 (two) times daily. Take 2 tablets 400mg  (total 800mg ) in the morning, and 400mg  in the evening (Patient not taking: Reported on 02/28/2019) 60 tablet 1  . metFORMIN (GLUCOPHAGE) 500 MG tablet TAKE 1 TABLET (500 MG TOTAL) BY MOUTH 2 (TWO) TIMES DAILY WITH A MEAL. 180 tablet 1  . methocarbamol (ROBAXIN) 500 MG tablet Take 1 tablet (500 mg total) by mouth daily as needed for muscle spasms. 30 tablet 0  . metoprolol tartrate (LOPRESSOR) 25 MG tablet Take 0.5 tablets (12.5 mg total) by mouth 2 (two) times daily. 90 tablet 3  .  Multiple Vitamins-Minerals (MULTIVITAMINS THER. W/MINERALS) TABS Take 1 tablet by mouth daily.    Marland Kitchen omeprazole (PRILOSEC) 20 MG capsule TAKE 1 CAPSULE BY MOUTH EVERY DAY 90 capsule 3    Objective: BP (!) 169/142 (BP Location: Right Arm)   Pulse (!) 148   Temp 98.5 F (36.9 C) (Oral)  Resp 15   SpO2 98%  Exam: General: A&O, no apparent distress Eyes: Atraumatic, full range of motion, EOM intact, negative nystagmus, PERRL ENTM: Right jaw pain, mid mandible, tender on palpation with erythema. Nares clear, oropharynx negative erythema, exudates, edema Neck: positive for tenderness over posterior head/neck (unchanged from chronic). Negative cervial lymphadenopathy or thyroid nodules  Cardiovascular: Tachycardic, irregular rhythm. Peripheral pulses strong.  Respiratory: CTAB, no wheezing, no increased WOB Gastrointestinal: Bowel sounds hyperactive diffusely, no abdominal pain on palpation, negative for pulsatile mass MSK: 5/5 strength in upper extremities bilaterally, 5/5 grip strength bilaterally, 5/5 hip flexors bilaterally Derm: No rashes noted on exam Neuro: cranial nerves grossly in tact, normal sensation bilaterally in upper/lower limbs, cerebellar function in tact- normal finger to nose and shin to heel. Able to follow commands. No focal deficits observed. Negative slurred speech or facial asymetry.  Psych: Speaking appropriately for situation. Normal affect and mood  Labs and Imaging: CBC BMET  Recent Labs  Lab 03/03/19 1125 03/03/19 1138  WBC 12.1*  --   HGB 16.2* 16.7*  HCT 48.7* 49.0*  PLT 237  --    Recent Labs  Lab 03/03/19 1125 03/03/19 1138  NA 138 137  K 3.0* 3.0*  CL 101  --   CO2 22  --   BUN 32*  --   CREATININE 1.07*  --   GLUCOSE 190*  --   CALCIUM 8.7*  --      EKG: My interpretation: irregular rhythm at 184bpm, slight left axis deviation.  Lurline Del, MD 03/03/2019, 3:35 PM PGY-1, [place] Intern pager: [phone], text  pages welcome    Flas[facility]    I have seen and examined this patient.     I have discussed the findings and exam with the intern and agree with the above note, which I have edited appropriately in [place]. I helped develop the management plan that is described in the resident's note, and I agree with the content.   Doristine Mango, DO PGY-2 Family Medicine Resident

## 2019-03-03 NOTE — ED Notes (Signed)
ED TO INPATIENT HANDOFF REPORT  ED Nurse Name and Phone #: Nolene Bernheim Name/Age/Gender [name] 73 y.o. female Room/Bed: 012C/012C  Code Status   Code Status: Prior  Home/SNF/Other Home Patient oriented to: self, place, time and situation Is this baseline? Yes   Triage Complete: Triage complete  Chief Complaint tachy  Triage Note Pt arrives via [facility] from home for c/o n/v/ gen weakness for the past few days. Daughter called out for syncopal episode, pt states she last remembers walking to the bathroom and then woke up on the floor. HR 180s with EMS, pt hypotensive in the 70s initially. Pt denies chest pain or sob. 520 cc normal saline, 4mg  zofran and 6mg  adenosine given en route, bp 111 now.    Allergies Allergies  Allergen Reactions  . Monopril [Fosinopril] Cough  . Norvasc [Amlodipine Besylate] Other (See Comments)    10 mg dose causes LE edema.  Tolerates 5mg  dose.    Level of Care/Admitting Diagnosis ED Disposition    ED Disposition Condition [facility] Area: [place] [[id number]]  Level of Care: Telemetry Cardiac [103]  Covid Evaluation: Asymptomatic Screening Protocol (No Symptoms)  Diagnosis: Syncope and collapse [780.2.ICD-9-CM]  Admitting Physician: Richarda Osmond [[id number]]  Attending Physician: Leeanne Rio [phone]  Estimated length of stay: 3 - 4 days  Certification:: I certify this patient will need inpatient services for at least 2 midnights  PT Class (Do Not Modify): Inpatient [101]  PT Acc Code (Do Not Modify): Private [1]       B Medical/Surgery History Past Medical History:  Diagnosis Date  . Arthritis    osteoarthritis  . Bursitis of right shoulder   . Chronic cholecystitis with calculus 05/04/2013  . 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 Diabetes type 2, controlled ([place]) 05/04/2013  . Fatty liver disease, nonalcoholic 6789   hospitalized, MRCP dx fatty  liver  . GERD (gastroesophageal reflux disease)   . History of hypertension 08/20/2016  . Hormone replacement therapy (postmenopausal)   . Hormone replacement therapy (postmenopausal)   . Hyperlipidemia   . Hypertension   . Hypertensive urgency 07/11/2018  . Hypokalemia 07/11/2018  . Malnutrition of moderate degree ([place]) 03/18/2014  . Nausea & vomiting 07/11/2018  . Normal cardiac stress test    pt can't remember when or where  . Obesity, unspecified 12/27/2012  . Rheumatoid arthritis(714.0) 09/28/2012  . Sepsis ([place]) 03/16/2014  . SIRS (systemic inflammatory response syndrome) ([place]) 07/11/2018  . 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 Unspecified essential hypertension 05/04/2013   Past Surgical History:  Procedure Laterality Date  . ABDOMINAL HYSTERECTOMY    . CHOLECYSTECTOMY N/A 05/04/2013   Procedure: LAPAROSCOPIC CHOLECYSTECTOMY WITH INTRAOPERATIVE CHOLANGIOGRAM;  Surgeon: Haywood Lasso, MD;  Location: [facility];  Service: General;  Laterality: N/A;  . ERCP N/A 05/02/2013   Procedure: ENDOSCOPIC RETROGRADE CHOLANGIOPANCREATOGRAPHY (ERCP);  Surgeon: Beryle Beams, MD;  Location: [facility];  Service: Endoscopy;  Laterality: N/A;  . JOINT REPLACEMENT     left knee  . NASAL SINUS SURGERY    . SPHINCTEROTOMY  05/02/2013   Procedure: SPHINCTEROTOMY;  Surgeon: Beryle Beams, MD;  Location: [facility];  Service: Endoscopy;;  . TONSILLECTOMY    . TOTAL KNEE ARTHROPLASTY  08/01/2012   Procedure: TOTAL KNEE ARTHROPLASTY;  Surgeon: Meredith Pel, MD;  Location: [facility];  Service: Orthopedics;  Laterality: Right;  Right total knee  arthroplasty  . TOTAL KNEE ARTHROPLASTY  08/01/2012   RIGHT  KNEE     A IV Location/Drains/Wounds Patient Lines/Drains/Airways Status   Active Line/Drains/Airways    Name:   Placement date:   Placement time:   Site:   Days:   Peripheral IV 03/03/19 Right Hand   03/03/19    1115    Hand   less than 1   External Urinary Catheter   03/03/19     1431    -   less than 1   Wound / Incision (Open or Dehisced) 07/12/18 Non-pressure wound Buttocks Right;Left;Medial red   07/12/18    1320    Buttocks   234          Intake/Output Last 24 hours No intake or output data in the 24 hours ending 03/03/19 1529  Labs/Imaging Results for orders placed or performed during the hospital encounter of 03/03/19 (from the past 48 hour(s))  Comprehensive metabolic panel     Status: Abnormal   Collection Time: 03/03/19 11:25 AM  Result Value Ref Range   Sodium 138 135 - 145 mmol/L   Potassium 3.0 (L) 3.5 - 5.1 mmol/L   Chloride 101 98 - 111 mmol/L   CO2 22 22 - 32 mmol/L   Glucose, Bld 190 (H) 70 - 99 mg/dL   BUN 32 (H) 8 - 23 mg/dL   Creatinine, Ser 1.07 (H) 0.44 - 1.00 mg/dL   Calcium 8.7 (L) 8.9 - 10.3 mg/dL   Total Protein 6.7 6.5 - 8.1 g/dL   Albumin 3.3 (L) 3.5 - 5.0 g/dL   AST 60 (H) 15 - 41 U/L   ALT 40 0 - 44 U/L   Alkaline Phosphatase 113 38 - 126 U/L   Total Bilirubin 1.2 0.3 - 1.2 mg/dL   GFR calc non Af Amer 51 (L) >60 mL/min   GFR calc Af Amer 59 (L) >60 mL/min   Anion gap 15 5 - 15    Comment: Performed at [facility], [address], [place], [place] [address]  Lipase, blood     Status: None   Collection Time: 03/03/19 11:25 AM  Result Value Ref Range   Lipase 30 11 - 51 U/L    Comment: Performed at [facility] [address] [place], [place] [address]  Troponin I (High Sensitivity)     Status: Abnormal   Collection Time: 03/03/19 11:25 AM  Result Value Ref Range   Troponin I (High Sensitivity) 64 (H) <18 ng/L    Comment: (NOTE) Elevated high sensitivity troponin I (hsTnI) values and significant  changes across serial measurements may suggest ACS but many other  chronic and acute conditions are known to elevate hsTnI results.  Refer to the "Links" section for chest pain algorithms and additional  guidance. Performed at [facility], [place] [address], [place], [place] [address]   CBC with  Differential     Status: Abnormal   Collection Time: 03/03/19 11:25 AM  Result Value Ref Range   WBC 12.1 (H) 4.0 - 10.5 K/uL   RBC 5.06 3.87 - 5.11 MIL/uL   Hemoglobin 16.2 (H) 12.0 - 15.0 g/dL   HCT 48.7 (H) 36.0 - 46.0 %   MCV 96.2 80.0 - 100.0 fL   MCH 32.0 26.0 - 34.0 pg   MCHC 33.3 30.0 - 36.0 g/dL   RDW 14.4 11.5 - 15.5 %   Platelets 237 150 - 400 K/uL   nRBC 0.0 0.0 -  0.2 %   Neutrophils Relative % 76 %   Neutro Abs 9.2 (H) 1.7 - 7.7 K/uL   Lymphocytes Relative 12 %   Lymphs Abs 1.5 0.7 - 4.0 K/uL   Monocytes Relative 11 %   Monocytes Absolute 1.3 (H) 0.1 - 1.0 K/uL   Eosinophils Relative 0 %   Eosinophils Absolute 0.0 0.0 - 0.5 K/uL   Basophils Relative 0 %   Basophils Absolute 0.0 0.0 - 0.1 K/uL   Immature Granulocytes 1 %   Abs Immature Granulocytes 0.07 0.00 - 0.07 K/uL    Comment: Performed at [facility] [address] [place], [place] [address]  Protime-INR     Status: None   Collection Time: 03/03/19 11:25 AM  Result Value Ref Range   Prothrombin Time 14.6 11.4 - 15.2 seconds   INR 1.2 0.8 - 1.2    Comment: (NOTE) INR goal varies based on device and disease states. Performed at [facility], [place] [address], [place], [place] [address]   Magnesium     Status: Abnormal   Collection Time: 03/03/19 11:25 AM  Result Value Ref Range   Magnesium 1.1 (L) 1.7 - 2.4 mg/dL    Comment: Performed at [facility] [address] [place], [place] [address]  Phosphorus     Status: None   Collection Time: 03/03/19 11:25 AM  Result Value Ref Range   Phosphorus 3.0 2.5 - 4.6 mg/dL    Comment: Performed at [facility] [address], [place], [place] [address]  Brain natriuretic peptide     Status: Abnormal   Collection Time: 03/03/19 11:25 AM  Result Value Ref Range   B Natriuretic Peptide 341.9 (H) 0.0 - 100.0 pg/mL    Comment: Performed at [facility] [address], [place], [place] [address]  POCT I-Stat EG7     Status: Abnormal    Collection Time: 03/03/19 11:38 AM  Result Value Ref Range   pH, Ven 7.525 (H) 7.250 - 7.430   pCO2, Ven 33.4 (L) 44.0 - 60.0 mmHg   pO2, Ven 164.0 (H) 32.0 - 45.0 mmHg   Bicarbonate 27.6 20.0 - 28.0 mmol/L   TCO2 29 22 - 32 mmol/L   O2 Saturation 100.0 %   Acid-Base Excess 5.0 (H) 0.0 - 2.0 mmol/L   Sodium 137 135 - 145 mmol/L   Potassium 3.0 (L) 3.5 - 5.1 mmol/L   Calcium, Ion 0.88 (LL) 1.15 - 1.40 mmol/L   HCT 49.0 (H) 36.0 - 46.0 %   Hemoglobin 16.7 (H) 12.0 - 15.0 g/dL   Patient temperature HIDE    Sample type VENOUS    Comment NOTIFIED PHYSICIAN   Lactic acid, plasma     Status: Abnormal   Collection Time: 03/03/19 11:54 AM  Result Value Ref Range   Lactic Acid, Venous 2.3 (HH) 0.5 - 1.9 mmol/L    Comment: CRITICAL RESULT CALLED TO, READ BACK BY AND VERIFIED WITH: C.CARLAN,RN @ 1309 03/03/2019 [place] Performed at [facility], [address] [place], [place] [address]   TSH     Status: None   Collection Time: 03/03/19 11:54 AM  Result Value Ref Range   TSH 3.788 0.350 - 4.500 uIU/mL    Comment: Performed by a 3rd Generation assay with a functional sensitivity of <=0.01 uIU/mL. Performed at [facility], [place] [address], [place],  [address]   SARS Coronavirus 2 (CEPHEID- Performed in [facility]  lab), Hosp Order     Status: None   Collection Time: 03/03/19  1:54 PM   Specimen: Nasopharyngeal Swab  Result Value Ref Range   SARS Coronavirus 2 NEGATIVE NEGATIVE    Comment: (NOTE) If result is NEGATIVE SARS-CoV-2 target nucleic acids are NOT DETECTED. The SARS-CoV-2 RNA is generally detectable in upper and lower  respiratory specimens during the acute phase of infection. The lowest  concentration of SARS-CoV-2 viral copies this assay can detect is 250  copies / mL. A negative result does not preclude SARS-CoV-2 infection  and should not be used as the sole basis for treatment or other  patient management decisions.  A negative result may  occur with  improper specimen collection / handling, submission of specimen other  than nasopharyngeal swab, presence of viral mutation(s) within the  areas targeted by this assay, and inadequate number of viral copies  (<250 copies / mL). A negative result must be combined with clinical  observations, patient history, and epidemiological information. If result is POSITIVE SARS-CoV-2 target nucleic acids are DETECTED. The SARS-CoV-2 RNA is generally detectable in upper and lower  respiratory specimens dur ing the acute phase of infection.  Positive  results are indicative of active infection with SARS-CoV-2.  Clinical  correlation with patient history and other diagnostic information is  necessary to determine patient infection status.  Positive results do  not rule out bacterial infection or co-infection with other viruses. If result is PRESUMPTIVE POSTIVE SARS-CoV-2 nucleic acids MAY BE PRESENT.   A presumptive positive result was obtained on the submitted specimen  and confirmed on repeat testing.  While 2019 novel coronavirus  (SARS-CoV-2) nucleic acids may be present in the submitted sample  additional confirmatory testing may be necessary for epidemiological  and / or clinical management purposes  to differentiate between  SARS-CoV-2 and other Sarbecovirus currently known to infect humans.  If clinically indicated additional testing with an alternate test  methodology [phone]) is advised. The SARS-CoV-2 RNA is generally  detectable in upper and lower respiratory sp ecimens during the acute  phase of infection. The expected result is Negative. Fact Sheet for Patients:  StrictlyIdeas.no Fact Sheet for Healthcare Providers: BankingDealers.co.za This test is not yet approved or cleared by the [place] FDA and has been authorized for detection and/or diagnosis of SARS-CoV-2 by FDA under an Emergency Use Authorization (EUA).  This  EUA will remain in effect (meaning this test can be used) for the duration of the COVID-19 declaration under Section 564(b)(1) of the Act, 21 U.S.C. section 360bbb-3(b)(1), unless the authorization is terminated or revoked sooner. Performed at [facility], [place] [address] [place], [place] [address]   Urinalysis, Routine w reflex microscopic     Status: Abnormal   Collection Time: 03/03/19  3:11 PM  Result Value Ref Range   Color, Urine YELLOW YELLOW   APPearance HAZY (A) CLEAR   Specific Gravity, Urine 1.013 1.005 - 1.030   pH 7.0 5.0 - 8.0   Glucose, UA NEGATIVE NEGATIVE mg/dL   Hgb urine dipstick SMALL (A) NEGATIVE   Bilirubin Urine NEGATIVE NEGATIVE   Ketones, ur 5 (A) NEGATIVE mg/dL   Protein, ur 100 (A) NEGATIVE mg/dL   Nitrite NEGATIVE NEGATIVE   Leukocytes,Ua MODERATE (A) NEGATIVE   RBC / HPF 0-5 0 - 5 RBC/hpf   WBC, UA 11-20 0 - 5 WBC/hpf   Bacteria, UA NONE SEEN NONE SEEN   Squamous Epithelial / LPF 0-5 0 - 5  Mucus PRESENT    Non Squamous Epithelial 0-5 (A) NONE SEEN    Comment: Performed at [facility], [place] [address], [place], [place] [address]   Dg Chest Port 1 View  Result Date: 03/03/2019 CLINICAL DATA:  Atrial fibrillation. EXAM: PORTABLE CHEST 1 VIEW COMPARISON:  Chest radiograph June 03, 2016 FINDINGS: Cardiomediastinal silhouette is normal. Mediastinal contours appear intact. Calcific atherosclerotic disease of the aorta. There is no evidence of focal airspace consolidation, pleural effusion or pneumothorax. Osseous structures are without acute abnormality. Soft tissues are grossly normal. IMPRESSION: No active disease. Electronically Signed   By: Fidela Salisbury M.D.   On: 03/03/2019 12:29    Pending Labs Unresulted Labs (From admission, onward)    Start     Ordered   03/03/19 1356  Hemoglobin A1c  Add-on,   AD     03/03/19 1355   03/03/19 1137  Gastrointestinal Panel by PCR , Stool  (Gastrointestinal Panel by PCR, Stool)  Once,   STAT      03/03/19 1136   03/03/19 1137  C difficile quick scan w PCR reflex  (C Difficile quick screen w PCR reflex panel)  Once, for 24 hours,   STAT     03/03/19 1136   03/03/19 1134  Culture, blood (routine x 2)  BLOOD CULTURE X 2,   STAT     03/03/19 1133   03/03/19 1133  Lactic acid, plasma  Now then every 2 hours,   STAT     03/03/19 1133   03/03/19 1133  Urine rapid drug screen (hosp performed)  ONCE - STAT,   STAT     03/03/19 1133   Signed and Held  CBC  Tomorrow morning,   R     Signed and Held   Signed and Held  Basic metabolic panel  Tomorrow morning,   R     Signed and Held          Vitals/Pain Today's Vitals   03/03/19 1250 03/03/19 1300 03/03/19 1315 03/03/19 1433  BP:  (!) 146/92 (!) 160/107 (!) 169/142  Pulse:  97  (!) 148  Resp:  15 11 15   Temp:    98.5 F (36.9 C)  TempSrc:    Oral  SpO2:  95%  98%  PainSc: 0-No pain       Isolation Precautions Enteric precautions (UV disinfection)  Medications Medications  diltiazem (CARDIZEM) 1 mg/mL load via infusion 15 mg (15 mg Intravenous Bolus from Bag 03/03/19 1257)    And  diltiazem (CARDIZEM) 100 mg in dextrose 5% 150mL (1 mg/mL) infusion (15 mg/hr Intravenous Rate/Dose Change 03/03/19 1447)  potassium chloride SA (K-DUR) CR tablet 40 mEq (0 mEq Oral Hold 03/03/19 1259)  sodium chloride 0.9 % bolus 1,000 mL (0 mLs Intravenous Stopped 03/03/19 1457)    Followed by  0.9 %  sodium chloride infusion (1,000 mLs Intravenous New Bag/Given 03/03/19 1257)  sodium chloride 0.9 % bolus 1,000 mL (1,000 mLs Intravenous New Bag/Given 03/03/19 1151)  potassium chloride 10 mEq in 100 mL IVPB (0 mEq Intravenous Stopped 03/03/19 1413)    Mobility walks with person assist Low fall risk   Focused Assessments Cardiac Assessment Handoff:  Cardiac Rhythm: Atrial fibrillation Lab Results  Component Value Date   TROPONINI <0.30 10/15/2013   No results found for: DDIMER Does the Patient currently have chest pain? No      R Recommendations: See Admitting Provider Note  Report given to:   Additional Notes:

## 2019-03-03 NOTE — Progress Notes (Signed)
ANTICOAGULATION CONSULT NOTE - Initial Consult  Pharmacy Consult for heparin  Indication: atrial fibrillation  Allergies  Allergen Reactions  . Magnesium-Containing Compounds Nausea Only and Other (See Comments)    Re: Mag-Ox at higher doses caused a lot of stomach upset (dose had to be decreased)  . Monopril [Fosinopril] Cough  . Norvasc [Amlodipine Besylate] Other (See Comments)    10 mg dose caused LE edema- can tolerate 5 mg dose    Patient Measurements: Height: 4\' 10"  (147.3 cm) Weight: 160 lb 1.6 oz (72.6 kg) IBW/kg (Calculated) : 40.9 Heparin Dosing Weight: 57kg  Vital Signs: Temp: 98.5 F (36.9 C) (07/11 1621) Temp Source: Oral (07/11 1621) BP: 145/100 (07/11 1720) Pulse Rate: 54 (07/11 1720)  Labs: Recent Labs    03/03/19 1125 03/03/19 1138 03/03/19 1618 03/03/19 1826  HGB 16.2* 16.7*  --   --   HCT 48.7* 49.0*  --   --   PLT 237  --   --   --   LABPROT 14.6  --   --   --   INR 1.2  --   --   --   CREATININE 1.07*  --   --   --   TROPONINIHS 64*  --  78* 93*    Estimated Creatinine Clearance: 39 mL/min (A) (by C-G formula based on SCr of 1.07 mg/dL (H)).   Medical History: Past Medical History:  Diagnosis Date  . Arthritis    osteoarthritis  . Bursitis of right shoulder   . Chronic cholecystitis with calculus 05/04/2013  . 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 Diabetes type 2, controlled ([place]) 05/04/2013  . Fatty liver disease, nonalcoholic 0932   hospitalized, MRCP dx fatty liver  . GERD (gastroesophageal reflux disease)   . History of hypertension 08/20/2016  . Hormone replacement therapy (postmenopausal)   . Hormone replacement therapy (postmenopausal)   . Hyperlipidemia   . Hypertension   . Hypertensive urgency 07/11/2018  . Hypokalemia 07/11/2018  . Malnutrition of moderate degree ([place]) 03/18/2014  . Nausea & vomiting 07/11/2018  . Normal cardiac stress test    pt can't remember when or where  .  Obesity, unspecified 12/27/2012  . Rheumatoid arthritis(714.0) 09/28/2012  . Sepsis ([place]) 03/16/2014  . SIRS (systemic inflammatory response syndrome) ([place]) 07/11/2018  . 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 Unspecified essential hypertension 05/04/2013    Medications:  Medications Prior to Admission  Medication Sig Dispense Refill Last Dose  . amLODipine (NORVASC) 5 MG tablet TAKE 1 TABLET BY MOUTH EVERY DAY (Patient taking differently: Take 5 mg by mouth daily. ) 90 tablet 1 02/28/2019 at Unknown time  . aspirin EC 81 MG tablet Take 81 mg by mouth daily.   02/28/2019 at 0800  . atorvastatin (LIPITOR) 10 MG tablet TAKE 1 TABLET BY MOUTH EVERY DAY (Patient taking differently: Take 10 mg by mouth daily. ) 90 tablet 3 02/28/2019 at Unknown time  . cloNIDine (CATAPRES) 0.3 MG tablet Take 1 tablet (0.3 mg total) by mouth 2 (two) times daily. 180 tablet 0 02/28/2019 at Unknown time  . estradiol (ESTRACE) 0.5 MG tablet Take 0.5 mg by mouth 2 (two) times a day.   02/28/2019 at Unknown time  . etanercept (ENBREL SURECLICK) 50 MG/ML injection INJECT 50 MG (0.98 ML) UNDER THE SKIN ONCE A WEEK (Patient taking differently: Inject 50 mg into the skin every Saturday at 6 PM. ) 12 Syringe 0 02/24/2019 at Unknown  time  . folic acid (FOLVITE) 1 MG tablet Take 1 tablet (1 mg total) by mouth 2 (two) times daily. 60 tablet 3 02/28/2019 at Unknown time  . leflunomide (ARAVA) 10 MG tablet TAKE 1 TABLET BY MOUTH EVERY DAY (Patient taking differently: Take 10 mg by mouth daily. ) 90 tablet 0 02/28/2019 at Unknown time  . levothyroxine (SYNTHROID) 25 MCG tablet Take 1 tab PO 1 hour before breakfast x 2 weeks, then take 2 tabs PO before breakfast daily (Patient taking differently: Take 25-50 mcg by mouth See admin instructions. Take 25 mcg by mouth one hour before breakfast for 2 weeks, then increase to 50 mcg daily) 90 tablet 0 02/28/2019 at Unknown time  . losartan-hydrochlorothiazide (HYZAAR) 100-12.5 MG tablet TAKE 1 TABLET BY  MOUTH EVERY DAY (Patient taking differently: Take 1 tablet by mouth daily. ) 90 tablet 1 02/28/2019 at Unknown time  . magnesium oxide (MAG-OX) 400 MG tablet Take 1 tablet (400 mg total) by mouth 2 (two) times daily. Take 2 tablets 400mg  (total 800mg ) in the morning, and 400mg  in the evening (Patient taking differently: Take 400 mg by mouth daily. ) 60 tablet 1 02/21/2019 at Unknown time  . metFORMIN (GLUCOPHAGE) 500 MG tablet TAKE 1 TABLET (500 MG TOTAL) BY MOUTH 2 (TWO) TIMES DAILY WITH A MEAL. 180 tablet 1 02/28/2019 at Unknown time  . metoprolol tartrate (LOPRESSOR) 25 MG tablet Take 0.5 tablets (12.5 mg total) by mouth 2 (two) times daily. 90 tablet 3 02/28/2019 at 2100  . omeprazole (PRILOSEC) 20 MG capsule TAKE 1 CAPSULE BY MOUTH EVERY DAY (Patient taking differently: Take 20 mg by mouth daily before breakfast. ) 90 capsule 3 02/28/2019 at Unknown time  . Calcium Carbonate-Vitamin D (CALCIUM 600 + D PO) Take 1 tablet by mouth 2 (two) times daily.   unk at [employer]  . cyanocobalamin 500 MCG tablet Take 1 tablet (500 mcg total) by mouth daily. 30 tablet 0 unk at unk  . diclofenac sodium (VOLTAREN) 1 % GEL Apply 3 g topically daily as needed (to painful sites (in 3 large joints)).    unk at [employer]  . Ferrous Sulfate (IRON) 325 (65 Fe) MG TABS Take 325 mg by mouth daily.    unk at [employer]  . fish oil-omega-3 fatty acids 1000 MG capsule Take 1 g by mouth at bedtime.    unk at [employer]  . methocarbamol (ROBAXIN) 500 MG tablet Take 1 tablet (500 mg total) by mouth daily as needed for muscle spasms. 30 tablet 0 unk at unk  . Multiple Vitamins-Minerals (MULTIVITAMINS THER. W/MINERALS) TABS Take 1 tablet by mouth daily.   unk at unk   Scheduled:  . aspirin EC  81 mg Oral Daily  . atorvastatin  10 mg Oral Daily  . heparin  5,000 Units Subcutaneous Q8H  . leflunomide  10 mg Oral Daily  . [START ON 03/04/2019] levothyroxine  25 mcg Oral Q0600  . metoprolol tartrate  12.5 mg Oral Q6H  . ondansetron (ZOFRAN) IV  4 mg Intravenous  Q6H    Assessment: 73 yo female here with syncopal episode. She is noted with new onset afib (CHADSVASC=4) and pharmacy to dose heparin.  Goal of Therapy:  Heparin level 0.3-0.7 units/ml Monitor platelets by anticoagulation protocol: Yes   Plan:  -Heparin bolus 3500units IV followed by 800 units/hr  -Heparin level in 8 hours and daily wth CBC daily  Hildred Laser, PharmD Clinical Pharmacist **Pharmacist phone directory can now be found on [email or web address] ([email or web address]  [email or web address]).  Listed under [place].

## 2019-03-03 NOTE — Progress Notes (Signed)
Pt's HR went up to 150s, paged Family teaching service, [facility]MT[facility] called back, order metoprolol 5mg  IV.

## 2019-03-04 ENCOUNTER — Inpatient Hospital Stay (HOSPITAL_COMMUNITY): Payer: PPO

## 2019-03-04 DIAGNOSIS — I351 Nonrheumatic aortic (valve) insufficiency: Secondary | ICD-10-CM

## 2019-03-04 DIAGNOSIS — I361 Nonrheumatic tricuspid (valve) insufficiency: Secondary | ICD-10-CM

## 2019-03-04 DIAGNOSIS — I4891 Unspecified atrial fibrillation: Principal | ICD-10-CM

## 2019-03-04 DIAGNOSIS — I480 Paroxysmal atrial fibrillation: Secondary | ICD-10-CM

## 2019-03-04 LAB — GASTROINTESTINAL PANEL BY PCR, STOOL (REPLACES STOOL CULTURE)

## 2019-03-04 LAB — CBC
HCT: 43.7 % (ref 36.0–46.0)
Hemoglobin: 14.4 g/dL (ref 12.0–15.0)
MCH: 31.7 pg (ref 26.0–34.0)
MCHC: 33 g/dL (ref 30.0–36.0)
MCV: 96.3 fL (ref 80.0–100.0)
Platelets: 177 10*3/uL (ref 150–400)
RBC: 4.54 MIL/uL (ref 3.87–5.11)
RDW: 14.3 % (ref 11.5–15.5)
WBC: 10.7 10*3/uL — ABNORMAL HIGH (ref 4.0–10.5)
nRBC: 0 % (ref 0.0–0.2)

## 2019-03-04 LAB — BASIC METABOLIC PANEL
Anion gap: 10 (ref 5–15)
BUN: 18 mg/dL (ref 8–23)
CO2: 23 mmol/L (ref 22–32)
Calcium: 7.6 mg/dL — ABNORMAL LOW (ref 8.9–10.3)
Chloride: 104 mmol/L (ref 98–111)
Creatinine, Ser: 0.79 mg/dL (ref 0.44–1.00)
GFR calc Af Amer: >60 mL/min (ref >60)
GFR calc non Af Amer: >60 mL/min (ref >60)
Glucose, Bld: 137 mg/dL — ABNORMAL HIGH (ref 70–99)
Potassium: 3.2 mmol/L — ABNORMAL LOW (ref 3.5–5.1)
Sodium: 137 mmol/L (ref 135–145)

## 2019-03-04 LAB — ECHOCARDIOGRAM COMPLETE
Height: 58 in
Weight: 2617.3 oz

## 2019-03-04 LAB — MAGNESIUM: Magnesium: 1.4 mg/dL — ABNORMAL LOW (ref 1.7–2.4)

## 2019-03-04 LAB — TROPONIN I (HIGH SENSITIVITY): Troponin I (High Sensitivity): 51 ng/L — ABNORMAL HIGH (ref <18)

## 2019-03-04 LAB — GLUCOSE, CAPILLARY
Glucose-Capillary: 142 mg/dL — ABNORMAL HIGH (ref 70–99)
Glucose-Capillary: 139 mg/dL — ABNORMAL HIGH (ref 70–99)
Glucose-Capillary: 159 mg/dL — ABNORMAL HIGH (ref 70–99)
Glucose-Capillary: 159 mg/dL — ABNORMAL HIGH (ref 70–99)

## 2019-03-04 LAB — HEPARIN LEVEL (UNFRACTIONATED): Heparin Unfractionated: 0.54 IU/mL (ref 0.30–0.70)

## 2019-03-04 MED ORDER — POTASSIUM CHLORIDE CRYS ER 20 MEQ PO TBCR
40.0000 meq | EXTENDED_RELEASE_TABLET | Freq: Two times a day (BID) | ORAL | Status: DC
  Administered 2019-03-04 – 2019-03-05 (×4): 40 meq via ORAL
  Filled 2019-03-04 (×4): qty 2

## 2019-03-04 MED ORDER — ALUM & MAG HYDROXIDE-SIMETH 200-200-20 MG/5ML PO SUSP
30.0000 mL | Freq: Four times a day (QID) | ORAL | Status: DC | PRN
  Administered 2019-03-04: 30 mL via ORAL
  Filled 2019-03-04: qty 30

## 2019-03-04 MED ORDER — METOPROLOL TARTRATE 25 MG PO TABS
12.5000 mg | ORAL_TABLET | Freq: Two times a day (BID) | ORAL | Status: DC
  Administered 2019-03-04 – 2019-03-05 (×4): 12.5 mg via ORAL
  Filled 2019-03-04 (×4): qty 1

## 2019-03-04 MED ORDER — APIXABAN 5 MG PO TABS
5.0000 mg | ORAL_TABLET | Freq: Two times a day (BID) | ORAL | Status: DC
  Administered 2019-03-04 – 2019-03-07 (×7): 5 mg via ORAL
  Filled 2019-03-04 (×7): qty 1

## 2019-03-04 MED ORDER — ONDANSETRON HCL 4 MG/2ML IJ SOLN
4.0000 mg | Freq: Four times a day (QID) | INTRAMUSCULAR | Status: DC | PRN
  Administered 2019-03-04: 4 mg via INTRAVENOUS
  Filled 2019-03-04: qty 2

## 2019-03-04 MED ORDER — DILTIAZEM HCL ER COATED BEADS 120 MG PO CP24
120.0000 mg | ORAL_CAPSULE | Freq: Every day | ORAL | Status: DC
  Administered 2019-03-04 – 2019-03-05 (×2): 120 mg via ORAL
  Filled 2019-03-04 (×2): qty 1

## 2019-03-04 MED ORDER — MELATONIN 3 MG PO TABS
6.0000 mg | ORAL_TABLET | Freq: Every evening | ORAL | Status: DC | PRN
  Administered 2019-03-04 – 2019-03-06 (×2): 6 mg via ORAL
  Filled 2019-03-04 (×3): qty 2

## 2019-03-04 MED ORDER — PANTOPRAZOLE SODIUM 40 MG PO TBEC
40.0000 mg | DELAYED_RELEASE_TABLET | Freq: Every day | ORAL | Status: DC
  Administered 2019-03-05 – 2019-03-07 (×3): 40 mg via ORAL
  Filled 2019-03-04 (×3): qty 1

## 2019-03-04 MED ORDER — MAGNESIUM SULFATE 2 GM/50ML IV SOLN
2.0000 g | Freq: Once | INTRAVENOUS | Status: AC
  Administered 2019-03-04: 2 g via INTRAVENOUS
  Filled 2019-03-04: qty 50

## 2019-03-04 MED ORDER — IBUPROFEN 200 MG PO TABS: 400.0000 mg | ORAL_TABLET | Freq: Four times a day (QID) | ORAL | Status: DC | PRN

## 2019-03-04 MED ORDER — PANTOPRAZOLE SODIUM 40 MG PO TBEC
40.0000 mg | DELAYED_RELEASE_TABLET | Freq: Once | ORAL | Status: AC
  Administered 2019-03-04: 40 mg via ORAL
  Filled 2019-03-04: qty 1

## 2019-03-04 MED ORDER — CLONIDINE HCL 0.1 MG PO TABS
0.1000 mg | ORAL_TABLET | Freq: Two times a day (BID) | ORAL | Status: DC
  Administered 2019-03-04 – 2019-03-05 (×3): 0.1 mg via ORAL
  Filled 2019-03-04 (×3): qty 1

## 2019-03-04 NOTE — Progress Notes (Signed)
Spoke with Family practice resident regarding patient having discomfort especially with a deep breath mostly below her neck area up into her throat and into the right side of her neck. Patient was given Maalox earlier with no relief and Zofran as well, VSS at this time but MD will come up to see and determine what to do, will continue to monitor.

## 2019-03-04 NOTE — Progress Notes (Signed)
ANTICOAGULATION CONSULT NOTE - Initial Consult  Pharmacy Consult for apixaban Indication: atrial fibrillation  Allergies  Allergen Reactions  . Magnesium-Containing Compounds Nausea Only and Other (See Comments)    Re: Mag-Ox at higher doses caused a lot of stomach upset (dose had to be decreased)  . Monopril [Fosinopril] Cough  . Norvasc [Amlodipine Besylate] Other (See Comments)    10 mg dose caused LE edema- can tolerate 5 mg dose    Patient Measurements: Height: 4\' 10"  (147.3 cm) Weight: 163 lb 9.3 oz (74.2 kg) IBW/kg (Calculated) : 40.9  Vital Signs: Temp: 98.7 F (37.1 C) (07/12 0602) Temp Source: Axillary (07/12 0602) BP: 146/89 (07/12 1000) Pulse Rate: 73 (07/12 0944)  Labs: Recent Labs    03/03/19 1125 03/03/19 1138 03/03/19 1618 03/03/19 1826 03/03/19 2047 03/04/19 0501  HGB 16.2* 16.7*  --   --   --  14.4  HCT 48.7* 49.0*  --   --   --  43.7  PLT 237  --   --   --   --  177  LABPROT 14.6  --   --   --   --   --   INR 1.2  --   --   --   --   --   HEPARINUNFRC  --   --   --   --   --  0.54  CREATININE 1.07*  --   --   --   --  0.79  TROPONINIHS 64*  --  78* 93* 93*  --     Estimated Creatinine Clearance: 52.8 mL/min (by C-G formula based on SCr of 0.79 mg/dL).   Medical History: Past Medical History:  Diagnosis Date  . Arthritis    osteoarthritis  . Bursitis of right shoulder   . Chronic cholecystitis with calculus 05/04/2013  . 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 Diabetes type 2, controlled ([place]) 05/04/2013  . Fatty liver disease, nonalcoholic 8453   hospitalized, MRCP dx fatty liver  . GERD (gastroesophageal reflux disease)   . History of hypertension 08/20/2016  . Hormone replacement therapy (postmenopausal)   . Hormone replacement therapy (postmenopausal)   . Hyperlipidemia   . Hypertension   . Hypertensive urgency 07/11/2018  . Hypokalemia 07/11/2018  . Malnutrition of moderate degree ([place])  03/18/2014  . Nausea & vomiting 07/11/2018  . Normal cardiac stress test    pt can't remember when or where  . Obesity, unspecified 12/27/2012  . Rheumatoid arthritis(714.0) 09/28/2012  . Sepsis ([place]) 03/16/2014  . SIRS (systemic inflammatory response syndrome) ([place]) 07/11/2018  . 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 Unspecified essential hypertension 05/04/2013    Assessment: 73 yo female here with syncopal episode. She is noted with new onset afib (CHADSVASC=4). Pharmacy consulted to start apixaban. Patient's CBC stable, and no bleeding noted.   Goal of Therapy:  Monitor platelets by anticoagulation protocol: Yes   Plan:  Stop heparin infusion today (03/04/19) at 1200 Start apixaban 5 mg PO BID at time of heparin infusion discontinuation Monitor CBC, and for s/sx of bleeding  Thank you for allowing pharmacy to be a part of this patient's care.  Leron Croak, PharmD PGY2 Oncology/Hematology Pharmacy Resident 03/04/2019 11:34 AM

## 2019-03-04 NOTE — Progress Notes (Signed)
Dr Lattie Haw, MD  Discussed with Orthopaedic surgeon-Dr Veverly Fells regarding ?prosthetic knee fracture. Advised to book CT knee without contrast and to order left knee immobiliser. He will kindly review the patient later.

## 2019-03-04 NOTE — Progress Notes (Signed)
Orthopedic Tech Progress Note Patient Details:  [name] 08-27-1945 [id number]  Ortho Devices Type of Ortho Device: Knee Immobilizer Ortho Device/Splint Location: LLE Ortho Device/Splint Interventions: Ordered, Application   Post Interventions Patient Tolerated: Well Instructions Provided: Care of device   Braulio Bosch 03/04/2019, 4:11 PM

## 2019-03-04 NOTE — Progress Notes (Addendum)
Family Medicine Teaching Service Daily Progress Note Intern Pager: [phone]  Patient name: [name] Medical record number: [id number] Date of birth: February 18, 1946 Age: 73 y.o. Gender: female  Primary Care Provider: Delsa Grana, PA-C Consultants: cards Code Status: full  Pt Overview and Major Events to Date:  [name] is a 73 y.o. female presenting with syncopal episode. PMH is significant for T2DM, HTN, HLD, and RA.  Assessment and Plan:  Syncope Likely secondary to new onset Afib and GI losses (diarrhea and vomiting prior to admission) causing hypovolemia  - Continue to consult cardiology-appreciate recommendations -Continue serial troponins, baseline over last day in 90s -Continue telemetry -Continue IV fluids-127ml/hr, perhaps increase rate - Rate control-currently on diltiazem gtt and metoprolol  - Orthostatic vital signs - Repeat ECG when HR decreases - Rollow blood cultures - Replace K+ (3.2 today) - Replace magnesium (1.1 on 7/11) - Repeat LA  Xray left knee: There is a cortical defect in the distal femur adjacent to the lateral aspect of the femoral component of the knee replacement. This is an age-indeterminate finding. A subtle acute fracture in this region is not excluded. No other evidence of fracture.  -DG Orthopantogram:  No acute bony abnormality   Electrolyte abnormalities  Hypokalemia  K 3.2 today (3 on 7/11) -Continue to replete with IV K 20meQ  Hypomagnesemia  Mg 1.1 on 7/11 received 2mg  Mg on 7/11 -Recheck Mg today and replete as necessary  -Continue to monitor and replete   New onset A fib with RVR-  HR 95 today, still Afib, denies chest pain Patient is not anticoagulated - consult cards, appreciate recs - Pharm advised to continue heparin drip 800units/ml  - Continue diltiazem drip, wean as tolerated once HR stable - Continue home metoprolol in IV and increase dose as needed for rapid HR -Serial troponins until stabile -  TTE -Dopplers lower limb -Can start DOAC on discharge as per insurance choice  -Consider stress test as an outpatient   Gastroenteritis (likely viral) Pt not currently vomiting/having diarrhea - Continue orthostatic vitals - Ondansetron prn - IV fluids  -continue to monitor CBC and BMP   Rheumatoid Arthritis: Home meds - Enbrel, Arava, Robaxin (PRN for muscle spasms), Per previous rheum note: +RF, +CCP, -ANA. Treated by Dr. Estanislado Pandy. - Continue home medications - tylenol PRN - hold home robaxin  T2DM- Hgb A1c 6.4 this admission-well controlled and less likely to be contributing factor to dulled ACS sensation. Home meds: metformin 500mg  BID Glucose today 142 - holding home metformin for now (500mg  BID) - CBGs daily  HTN- home meds: amlodipine 5mg , clonidine 0.3mg  BID, hyzaar 100-12.5, metoprolol 12.5mg  BID BP 145/92 today  -Restarted clonidine this morning (to avoid rebound HTN) with lowered dose 0.1 mg BID  - holding amlodipine, losartan-HCTZ in setting of hypovolaemia (on dilt drip and IV metop) - Metoprolol converted to IV   FEN/GI: heart health, 172mL/NS, droplet precautions PPx: heparin subc  Post rounds addendum Liase with cardio regarding diltazem Liase with ortho regarding knee xray result  Await Echo result Stop IV fluids Replete Mg as low today  Replace K orally  Chase orthostatic Bps  Disposition: pending improvement of syncope and Afib   Subjective:  Pt lying in bed. Feels weak and tired. Reduced appetite but has eaten some breakfast. No further vomiting or diarrhea. No chest pain or dyspnea. I explained to the patient that we are investigating her Afib cause and trying to control her heart rate and also correct her dehydration. I explained  that cardiology will see her today as well. Pt happy with plan.  Objective: Temp:  [98.2 F (36.8 C)-99.1 F (37.3 C)] 98.7 F (37.1 C) (07/12 0602) Pulse Rate:  [54-148] 103 (07/12 0615) Resp:  [0-24] 22 (07/12  0615) BP: (100-169)/(68-142) 136/83 (07/12 0615) SpO2:  [94 %-99 %] 96 % (07/12 0615) Weight:  [72.6 kg-74.2 kg] 74.2 kg (07/12 0515)   General: No acute distress, alert and orientated Cardio: Normal S1 and S2, no S3 or S4. Rhythm is irregular. Tachycardic. No murmurs or rubs.   Pulm:chest clear, no crackles or wheeze. Normal respiratory effort. Abdomen: abdomen soft non tender, no organomegaly. Bowel sounds present  Extremities: No peripheral edema. Warm/ well perfused.   Neuro: Cranial nerves grossly intact  Laboratory: Recent Labs  Lab 03/03/19 1125 03/03/19 1138 03/04/19 0501  WBC 12.1*  --  10.7*  HGB 16.2* 16.7* 14.4  HCT 48.7* 49.0* 43.7  PLT 237  --  177   Recent Labs  Lab 03/03/19 1125 03/03/19 1138 03/04/19 0501  NA 138 137 137  K 3.0* 3.0* 3.2*  CL 101  --  104  CO2 22  --  23  BUN 32*  --  18  CREATININE 1.07*  --  0.79  CALCIUM 8.7*  --  7.6*  PROT 6.7  --   --   BILITOT 1.2  --   --   ALKPHOS 113  --   --   ALT 40  --   --   AST 60*  --   --   GLUCOSE 190*  --  137*    Imaging/Diagnostic Tests: Dg Orthopantogram  Result Date: 03/03/2019 CLINICAL DATA:  Recent fall with jaw pain, initial encounter EXAM: ORTHOPANTOGRAM/PANORAMIC COMPARISON:  None. FINDINGS: Panoramic view of the mandible was obtained and reveals no acute fracture. No temporomandibular joint dislocation is seen. No acute dental abnormality is noted. Dental caries are seen particularly in the maxillary molars. IMPRESSION: No acute bony abnormality noted. Electronically Signed   By: Inez Catalina M.D.   On: 03/03/2019 18:05   Dg Chest Port 1 View  Result Date: 03/03/2019 CLINICAL DATA:  Atrial fibrillation. EXAM: PORTABLE CHEST 1 VIEW COMPARISON:  Chest radiograph June 03, 2016 FINDINGS: Cardiomediastinal silhouette is normal. Mediastinal contours appear intact. Calcific atherosclerotic disease of the aorta. There is no evidence of focal airspace consolidation, pleural effusion or  pneumothorax. Osseous structures are without acute abnormality. Soft tissues are grossly normal. IMPRESSION: No active disease. Electronically Signed   By: Fidela Salisbury M.D.   On: 03/03/2019 12:29   Dg Knee Complete 4 Views Left  Result Date: 03/03/2019 CLINICAL DATA:  Pain after fall EXAM: LEFT KNEE - COMPLETE 4+ VIEW COMPARISON:  None. FINDINGS: The patient is status post left knee replacement. Hardware is in good position. Vascular calcifications. Small joint effusion. There is a cortical defect in the distal femur adjacent to the lateral aspect of the femoral component of the knee replacement. No other evidence of fracture. IMPRESSION: 1. There is a cortical defect in the distal femur adjacent to the lateral aspect of the femoral component of the knee replacement. This is an age-indeterminate finding. A subtle acute fracture in this region is not excluded. No other evidence of fracture. Electronically Signed   By: Dorise Bullion III M.D   On: 03/03/2019 18:07    Lattie Haw, MD 03/04/2019, 7:11 AM PGY-1, [facility] Intern pager: [phone], text pages welcome

## 2019-03-04 NOTE — Progress Notes (Signed)
Progress Note  Patient Name: [name] Date of Encounter: 03/04/2019  Primary Cardiologist: Dr. Quay Burow  Subjective   Still in A. fib with a better ventricular response on IV heparin and Cardizem.  Inpatient Medications    Scheduled Meds: . aspirin EC  81 mg Oral Daily  . atorvastatin  10 mg Oral Daily  . cloNIDine  0.1 mg Oral BID  . leflunomide  10 mg Oral Daily  . levothyroxine  25 mcg Oral Q0600  . metoprolol tartrate  12.5 mg Oral Q6H  . ondansetron (ZOFRAN) IV  4 mg Intravenous Q6H   Continuous Infusions: . sodium chloride 1,000 mL (03/04/19 0000)  . diltiazem (CARDIZEM) infusion 5 mg/hr (03/04/19 0757)  . heparin 800 Units/hr (03/04/19 0600)   PRN Meds: acetaminophen **OR** acetaminophen, Melatonin, ondansetron   Vital Signs    Vitals:   03/04/19 0756 03/04/19 0921 03/04/19 0944 03/04/19 1000  BP: (!) 145/92 (!) 144/89 (!) 146/89 (!) 146/89  Pulse: 95 94 73   Resp: 10     Temp:      TempSrc:      SpO2: 99% 94% 95%   Weight:      Height:        Intake/Output Summary (Last 24 hours) at 03/04/2019 1122 Last data filed at 03/04/2019 0900 Gross per 24 hour  Intake 1032.07 ml  Output 700 ml  Net 332.07 ml   Last 3 Weights 03/04/2019 03/03/2019 02/28/2019  Weight (lbs) 163 lb 9.3 oz 160 lb 1.6 oz 157 lb 9.6 oz  Weight (kg) 74.2 kg 72.621 kg 71.487 kg      Telemetry    Atrial fibrillation with a ventricular response in the 90-100 range- Personally Reviewed  ECG    A. fib at 94 with nonspecific ST and T wave changes- Personally Reviewed  Physical Exam   GEN: No acute distress.   Neck: No JVD Cardiac:  Irregularly irregular, no murmurs, rubs, or gallops.  Respiratory: Clear to auscultation bilaterally. GI: Soft, nontender, non-distended  MS: No edema; No deformity. Neuro:  Nonfocal  Psych: Normal affect   Labs    High Sensitivity Troponin:   Recent Labs  Lab 03/03/19 1125 03/03/19 1618 03/03/19 1826 03/03/19 2047  TROPONINIHS 64*  78* 93* 93*      Cardiac EnzymesNo results for input(s): TROPONINI in the last 168 hours. No results for input(s): TROPIPOC in the last 168 hours.   Chemistry Recent Labs  Lab 03/03/19 1125 03/03/19 1138 03/04/19 0501  NA 138 137 137  K 3.0* 3.0* 3.2*  CL 101  --  104  CO2 22  --  23  GLUCOSE 190*  --  137*  BUN 32*  --  18  CREATININE 1.07*  --  0.79  CALCIUM 8.7*  --  7.6*  PROT 6.7  --   --   ALBUMIN 3.3*  --   --   AST 60*  --   --   ALT 40  --   --   ALKPHOS 113  --   --   BILITOT 1.2  --   --   GFRNONAA 51*  --  >60  GFRAA 59*  --  >60  ANIONGAP 15  --  10     Hematology Recent Labs  Lab 03/03/19 1125 03/03/19 1138 03/04/19 0501  WBC 12.1*  --  10.7*  RBC 5.06  --  4.54  HGB 16.2* 16.7* 14.4  HCT 48.7* 49.0* 43.7  MCV 96.2  --  96.3  MCH 32.0  --  31.7  MCHC 33.3  --  33.0  RDW 14.4  --  14.3  PLT 237  --  177    BNP Recent Labs  Lab 03/03/19 1125  BNP 341.9*     DDimer No results for input(s): DDIMER in the last 168 hours.   Radiology    Dg Orthopantogram  Result Date: 03/03/2019 CLINICAL DATA:  Recent fall with jaw pain, initial encounter EXAM: ORTHOPANTOGRAM/PANORAMIC COMPARISON:  None. FINDINGS: Panoramic view of the mandible was obtained and reveals no acute fracture. No temporomandibular joint dislocation is seen. No acute dental abnormality is noted. Dental caries are seen particularly in the maxillary molars. IMPRESSION: No acute bony abnormality noted. Electronically Signed   By: Inez Catalina M.D.   On: 03/03/2019 18:05   Dg Chest Port 1 View  Result Date: 03/03/2019 CLINICAL DATA:  Atrial fibrillation. EXAM: PORTABLE CHEST 1 VIEW COMPARISON:  Chest radiograph June 03, 2016 FINDINGS: Cardiomediastinal silhouette is normal. Mediastinal contours appear intact. Calcific atherosclerotic disease of the aorta. There is no evidence of focal airspace consolidation, pleural effusion or pneumothorax. Osseous structures are without acute  abnormality. Soft tissues are grossly normal. IMPRESSION: No active disease. Electronically Signed   By: Fidela Salisbury M.D.   On: 03/03/2019 12:29   Dg Knee Complete 4 Views Left  Result Date: 03/03/2019 CLINICAL DATA:  Pain after fall EXAM: LEFT KNEE - COMPLETE 4+ VIEW COMPARISON:  None. FINDINGS: The patient is status post left knee replacement. Hardware is in good position. Vascular calcifications. Small joint effusion. There is a cortical defect in the distal femur adjacent to the lateral aspect of the femoral component of the knee replacement. No other evidence of fracture. IMPRESSION: 1. There is a cortical defect in the distal femur adjacent to the lateral aspect of the femoral component of the knee replacement. This is an age-indeterminate finding. A subtle acute fracture in this region is not excluded. No other evidence of fracture. Electronically Signed   By: Dorise Bullion III M.D   On: 03/03/2019 18:07    Cardiac Studies   None  Patient Profile     [name] is a 73 y.o. female with a hx of HTN, DM2, HLD, who was admitted for syncope in the setting of persistent n/v who is being seen for the evaluation of new AF   Assessment & Plan    1: Atrial fibrillation with RVR- heart rate better controlled on IV diltiazem.  She is on IV heparin as well. The CHA2DSVASC2 score is 4  .  Will start on Eliquis oral anticoagulation.  We will also begin oral rate controlling agents.  2D echo is pending.  She can probably be brought back after 4 to 6 weeks of oral anticoagulation if she does not convert on her own for outpatient cardioversion  2: Syncope- potentially related to hypovolemia although could also be related to her A. fib with RVR.  3: Elevated troponin- fairly flat, no chest pain, doubt ACS.  Probably demand ischemia from A. fib  4: Essential hypertension- on clonidine, losartan and hydrochlorothiazide at home.  Blood pressures in the 140/90 range.  Will begin oral beta-blocker  for rate control.     For questions or updates, please contact [facility] Please consult [email or web address] for contact info under        Signed, Quay Burow, MD  03/04/2019, 11:22 AM

## 2019-03-04 NOTE — Progress Notes (Signed)
Called resident from teaching service D/T back pain not alleviated by Tylenol, will order Motrin, attempted to give to patient but she refused saying that it bothers her stomach and so we repsoitioned her instead, will continue to monitor.

## 2019-03-04 NOTE — Progress Notes (Signed)
Called by Resident to clarify rate control regimen. Discussed with Dr. [name] Continue Metoprolol. DC IV Diltiazem Start Cardizem CD 120 mg once daily  Richardson Dopp, PA-C    03/04/2019 12:52 PM

## 2019-03-04 NOTE — Progress Notes (Signed)
  Echocardiogram 2D Echocardiogram has been performed.  Jennette Dubin 03/04/2019, 12:42 PM

## 2019-03-04 NOTE — Progress Notes (Signed)
[facility] for heparin  Indication: atrial fibrillation  Allergies  Allergen Reactions  . Magnesium-Containing Compounds Nausea Only and Other (See Comments)    Re: Mag-Ox at higher doses caused a lot of stomach upset (dose had to be decreased)  . Monopril [Fosinopril] Cough  . Norvasc [Amlodipine Besylate] Other (See Comments)    10 mg dose caused LE edema- can tolerate 5 mg dose    Patient Measurements: Height: 4\' 10"  (147.3 cm) Weight: 163 lb 9.3 oz (74.2 kg) IBW/kg (Calculated) : 40.9 Heparin Dosing Weight: 57kg  Vital Signs: Temp: 98.7 F (37.1 C) (07/12 0602) Temp Source: Axillary (07/12 0602) BP: 136/83 (07/12 0615) Pulse Rate: 103 (07/12 0615)  Labs: Recent Labs    03/03/19 1125 03/03/19 1138 03/03/19 1618 03/03/19 1826 03/03/19 2047 03/04/19 0501  HGB 16.2* 16.7*  --   --   --  14.4  HCT 48.7* 49.0*  --   --   --  43.7  PLT 237  --   --   --   --  177  LABPROT 14.6  --   --   --   --   --   INR 1.2  --   --   --   --   --   HEPARINUNFRC  --   --   --   --   --  0.54  CREATININE 1.07*  --   --   --   --  0.79  TROPONINIHS 64*  --  78* 93* 93*  --     Estimated Creatinine Clearance: 52.8 mL/min (by C-G formula based on SCr of 0.79 mg/dL).   Assessment: 73 yo female here with syncopal episode. She is noted with new onset afib (CHADSVASC=4) and pharmacy to dose heparin. Initial heparin level therapeutic 0.54 units/ml  Goal of Therapy:  Heparin level 0.3-0.7 units/ml Monitor platelets by anticoagulation protocol: Yes   Plan:  -Continue heparin drip at 800 units/hr  Check heparin level later today to confirm  Thanks for allowing pharmacy to be a part of this patient's care.  Excell Seltzer, PharmD Clinical Pharmacist

## 2019-03-05 ENCOUNTER — Other Ambulatory Visit: Payer: Self-pay | Admitting: Family Medicine

## 2019-03-05 LAB — BASIC METABOLIC PANEL
Anion gap: 8 (ref 5–15)
BUN: 20 mg/dL (ref 8–23)
CO2: 23 mmol/L (ref 22–32)
Calcium: 7.7 mg/dL — ABNORMAL LOW (ref 8.9–10.3)
Chloride: 107 mmol/L (ref 98–111)
Creatinine, Ser: 0.95 mg/dL (ref 0.44–1.00)
GFR calc Af Amer: >60 mL/min (ref >60)
GFR calc non Af Amer: 59 mL/min — ABNORMAL LOW (ref >60)
Glucose, Bld: 183 mg/dL — ABNORMAL HIGH (ref 70–99)
Potassium: 3.8 mmol/L (ref 3.5–5.1)
Sodium: 138 mmol/L (ref 135–145)

## 2019-03-05 LAB — MAGNESIUM: Magnesium: 1.7 mg/dL (ref 1.7–2.4)

## 2019-03-05 LAB — CBC
HCT: 40.4 % (ref 36.0–46.0)
Hemoglobin: 13.2 g/dL (ref 12.0–15.0)
MCH: 31.8 pg (ref 26.0–34.0)
MCHC: 32.7 g/dL (ref 30.0–36.0)
MCV: 97.3 fL (ref 80.0–100.0)
Platelets: 143 10*3/uL — ABNORMAL LOW (ref 150–400)
RBC: 4.15 MIL/uL (ref 3.87–5.11)
RDW: 14.4 % (ref 11.5–15.5)
WBC: 8.1 10*3/uL (ref 4.0–10.5)
nRBC: 0 % (ref 0.0–0.2)

## 2019-03-05 LAB — GLUCOSE, CAPILLARY
Glucose-Capillary: 140 mg/dL — ABNORMAL HIGH (ref 70–99)
Glucose-Capillary: 177 mg/dL — ABNORMAL HIGH (ref 70–99)
Glucose-Capillary: 167 mg/dL — ABNORMAL HIGH (ref 70–99)
Glucose-Capillary: 143 mg/dL — ABNORMAL HIGH (ref 70–99)

## 2019-03-05 LAB — TROPONIN I (HIGH SENSITIVITY): Troponin I (High Sensitivity): 28 ng/L — ABNORMAL HIGH (ref <18)

## 2019-03-05 MED ORDER — DILTIAZEM HCL ER COATED BEADS 240 MG PO CP24: 240.0000 mg | ORAL_CAPSULE | Freq: Every day | ORAL | Status: DC

## 2019-03-05 NOTE — Progress Notes (Addendum)
Family Medicine Teaching Service Daily Progress Note Intern Pager: [phone]  Patient name: [name] Medical record number: [id number] Date of birth: June 16, 1946 Age: 73 y.o. Gender: female  Primary Care Provider: Delsa Grana, PA-C Consultants: cards Code Status: full  Pt Overview and Major Events to Date:  Jameyah Fennewald is a 73 y.o. female presenting with syncopal episode. PMH is significant for T2DM, HTN, HLD, and RA.  Assessment and Plan:  Syncope Likely secondary to new onset Afib and GI losses (diarrhea and vomiting prior to admission) causing hypovolemia  - Continue to consult cardiology-appreciate recommendations - Orthostatic vital signs - follow blood cultures -Dopplers  Hypokalamia-resolved K+ 3.8 today -Continue to monitor with BMP   Hypomagnesemia-resolved Mg today 1.7 -continue to monitor and replete if necessary   ?Knee fracture  Xray left knee on 7/13: There is a cortical defect in the distal femur adjacent to the lateral aspect of the femoral component of the knee replacement.This is an age-indeterminate finding. A subtle acute fracture in this region is not excluded. No other evidence of fracture. Consulted by ortho on 7/12-advised CT knee without contrast which showed: No acute abnormality. Possible tiny chip fracture off the lateral femur is not visible on this examination but could be obscured by streak artifact. However, there is no lipohemarthrosis as typically seen and knee fractures. Small joint effusion. -Continue with knee immoblizer as per ortho-many thanks for recs -Monitor pain and swelling -analgesia PRN for pain   New onset A fib with RVR-  HR 120 today, still Afib, has chest discomfort-burning and pleurtic  Patient now on Apixaban 5mg  once daily - consult cards, appreciate recs -Serial troponins as trending downwards-28 today, 51 on 7/12, 93 on 7/11,93 on 7/11-consider stopping - Rate control-Now on oral diltiazem 120mg  once daily and  metoprolol 12.5mg  BID-as per - Echo-ejection fraction of 60-65%. The cavity size was normal. There is mildly increased left ventricular wall thickness. Left ventricular diastolic Doppler parameters are consistent with  pseudonormalization. -Dopplers lower limb -Consider stress test as an outpatient   Gastroenteritis (likely viral) Pt not currently vomiting/having diarrhea - Continue orthostatic vitals - Ondansetron prn - IV fluids  -continue to monitor CBC and BMP   HTN- home meds: amlodipine 5mg , clonidine 0.3mg  BID, hyzaar 100-12.5, metoprolol 12.5mg  BID BP 111/70 today  -Restarted clonidine 7/12 (to avoid rebound HTN) with lowered dose 0.1 mg BID  - holding amlodipine, losartan-HCTZ in setting of hypovolaemia-consider adding on when Bp tolerates - restarted oral metoprolol 12.5mg  BID on 7/11  Rheumatoid Arthritis: Home meds - Enbrel, Arava, Robaxin (PRN for muscle spasms), Per previous rheum note: +RF, +CCP, -ANA. Treated by Dr. Estanislado Pandy. - Continue home medications - tylenol PRN - hold home robaxin  T2DM- Hgb A1c 6.4 this admission-well controlled and less likely to be contributing factor to dulled ACS sensation. Home meds: metformin 500mg  BID Last glucose 159 7/12 - holding home metformin for now (500mg  BID) - CBGs daily  Post rounds addendum -Stop clonidine as per cardio -Incentive spirometry -Continue to hold antihypertensives -continue as per ortho for knee -continue as per cardio  -Mobilize as able   FEN/GI: normal diet as per pts request, PPx: apixaban   Disposition: pending improvement of syncope and Afib   Subjective:  Pt has slept well over night after aid with medication. Reports burning retrosternal chest pain over night. Not improved with PPI, radiated to the right neck. Usually gets this pain at home. Also feels it is pleuritic and prevents her from taking a deep  breath. She is concerned about this. Also feels like she has a lot of gas. No  nausea/vomiting.    Objective: Temp:  [97.4 F (36.3 C)-99.1 F (37.3 C)] 99.1 F (37.3 C) (07/12 2202) Pulse Rate:  [52-109] 74 (07/13 0555) Resp:  [10-23] 21 (07/13 0555) BP: (97-146)/(66-99) 111/70 (07/13 0555) SpO2:  [93 %-99 %] 96 % (07/13 0555) Weight:  [72.8 kg] 72.8 kg (07/13 0620)   General: Alert and cooperative, looks a little anxious HEENT: Neck non-tender without lymphadenopathy, masses or thyromegaly Cardio: Normal S1 and S2 + Afib. Rhythm is irregular. Tachycardic HR 120 No murmurs or rubs.   Pulm: new left basal crackles, no wheezing, or diminished breath sounds. Normal respiratory effort Abdomen: Bowel sounds normal. Abdomen soft and non-tender.  Extremities: No peripheral edema. Warm/ well perfused.   Neuro: Cranial nerves grossly intact  Laboratory: Recent Labs  Lab 03/03/19 1125 03/03/19 1138 03/04/19 0501 03/05/19 0021  WBC 12.1*  --  10.7* 8.1  HGB 16.2* 16.7* 14.4 13.2  HCT 48.7* 49.0* 43.7 40.4  PLT 237  --  177 143*   Recent Labs  Lab 03/03/19 1125 03/03/19 1138 03/04/19 0501 03/05/19 0021  NA 138 137 137 138  K 3.0* 3.0* 3.2* 3.8  CL 101  --  104 107  CO2 22  --  23 23  BUN 32*  --  18 20  CREATININE 1.07*  --  0.79 0.95  CALCIUM 8.7*  --  7.6* 7.7*  PROT 6.7  --   --   --   BILITOT 1.2  --   --   --   ALKPHOS 113  --   --   --   ALT 40  --   --   --   AST 60*  --   --   --   GLUCOSE 190*  --  137* 183*    Imaging/Diagnostic Tests: Dg Orthopantogram  Result Date: 03/03/2019 CLINICAL DATA:  Recent fall with jaw pain, initial encounter EXAM: ORTHOPANTOGRAM/PANORAMIC COMPARISON:  None. FINDINGS: Panoramic view of the mandible was obtained and reveals no acute fracture. No temporomandibular joint dislocation is seen. No acute dental abnormality is noted. Dental caries are seen particularly in the maxillary molars. IMPRESSION: No acute bony abnormality noted. Electronically Signed   By: Inez Catalina M.D.   On: 03/03/2019 18:05   Ct Knee  Left Wo Contrast  Result Date: 03/04/2019 CLINICAL DATA:  The patient suffered a fall and blow to the left knee do a syncopal episode 03/03/2019. Possible fracture on plain films. Initial encounter. EXAM: CT OF THE LEFT KNEE WITHOUT CONTRAST TECHNIQUE: Multidetector CT imaging of the left knee was performed according to the standard protocol. Multiplanar CT image reconstructions were also generated. COMPARISON:  Plain films left knee 03/03/2019. 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 Possible tiny chip fracture adjacent to the lateral aspect of the femoral component is not visible on this exam. No fracture is identified. Hardware is intact without evidence of loosening or other complicating feature. Ligaments Suboptimally assessed by CT. Muscles and Tendons Intact. Soft tissues Small joint effusion noted.  No lipohemarthrosis IMPRESSION: No acute abnormality. Possible tiny chip fracture off the lateral femur is not visible on this examination but could be obscured by streak artifact. However, there is no lipohemarthrosis as typically seen and knee fractures. Small joint effusion. Electronically Signed   By: Inge Rise M.D.   On: 03/04/2019 16:51  Dg Chest Port 1 View  Result Date: 03/03/2019 CLINICAL DATA:  Atrial fibrillation. EXAM: PORTABLE CHEST 1 VIEW COMPARISON:  Chest radiograph June 03, 2016 FINDINGS: Cardiomediastinal silhouette is normal. Mediastinal contours appear intact. Calcific atherosclerotic disease of the aorta. There is no evidence of focal airspace consolidation, pleural effusion or pneumothorax. Osseous structures are without acute abnormality. Soft tissues are grossly normal. IMPRESSION: No active disease. Electronically Signed   By: Fidela Salisbury M.D.   On: 03/03/2019 12:29   Dg Knee Complete 4 Views Left  Result Date: 03/03/2019 CLINICAL DATA:  Pain after fall EXAM: LEFT KNEE - COMPLETE 4+ VIEW  COMPARISON:  None. FINDINGS: The patient is status post left knee replacement. Hardware is in good position. Vascular calcifications. Small joint effusion. There is a cortical defect in the distal femur adjacent to the lateral aspect of the femoral component of the knee replacement. No other evidence of fracture. IMPRESSION: 1. There is a cortical defect in the distal femur adjacent to the lateral aspect of the femoral component of the knee replacement. This is an age-indeterminate finding. A subtle acute fracture in this region is not excluded. No other evidence of fracture. Electronically Signed   By: Dorise Bullion III M.D   On: 03/03/2019 18:07    Lattie Haw, MD 03/05/2019, 6:33 AM PGY-1, [place] Intern pager: [phone], text pages welcome

## 2019-03-05 NOTE — Evaluation (Signed)
Physical Therapy Evaluation Patient Details Name: [name] MRN: [id number] DOB: March 21, 1946 Today's Date: 03/05/2019   History of Present Illness  [name] is a 73 y.o. female presenting with syncopal episode. PMH is significant for T2DM, HTN, HLD, and RA.  Apparently fell and injured L knee with PMH positive for remote TKA.  WBAT in knee immobilizer per chart.  Clinical Impression  Patient presents with decreased mobility due to recent fall with L knee pain and swelling and syncope.  Currently able to mobilize with S to minguard A with RW and L knee immobilizer.  Feel she should be able to go home with intermittent family support and follow up HHPT.  PT to follow acutely.  BP measurements this session not indicative of orthostatic hypotension, but HR max 135 with ambulation and pt reporting fatigue.     Follow Up Recommendations Home health PT;Supervision - Intermittent    Equipment Recommendations  Other (comment)(TBA depending on if family finder her equipment)    Recommendations for Other Services       Precautions / Restrictions Precautions Precautions: Fall Required Braces or Orthoses: Knee Immobilizer - Left Knee Immobilizer - Left: On when out of bed or walking Restrictions Weight Bearing Restrictions: Yes LLE Weight Bearing: Weight bearing as tolerated      Mobility  Bed Mobility Overal bed mobility: Needs Assistance Bed Mobility: Supine to Sit     Supine to sit: Supervision        Transfers Overall transfer level: Needs assistance Equipment used: Rolling walker (2 wheeled) Transfers: Sit to/from Stand Sit to Stand: Supervision         General transfer comment: assist for safety; cues for technique due to knee immobilizer  Ambulation/Gait Ambulation/Gait assistance: Supervision;Min guard Gait Distance (Feet): 90 Feet Assistive device: Rolling walker (2 wheeled) Gait Pattern/deviations: Step-to pattern;Step-through pattern;Decreased stride length     General Gait Details: cues for technique to protect weight bearing on L LE if needed, but pt able to demonstrate sequence without cues  Stairs            Wheelchair Mobility    Modified Rankin (Stroke Patients Only)       Balance Overall balance assessment: Needs assistance   Sitting balance-Leahy Scale: Good       Standing balance-Leahy Scale: Fair                               Pertinent Vitals/Pain Pain Assessment: Faces Faces Pain Scale: Hurts a little bit Pain Location: L knee medial aspect Pain Descriptors / Indicators: Sore Pain Intervention(s): Monitored during session;Repositioned;Ice applied    Home Living Family/patient expects to be discharged to:: Private residence Living Arrangements: Children(grandaughter and her s.o.) Available Help at Discharge: Family Type of Home: House Home Access: Ramped entrance     Home Layout: Two level;Able to live on main level with bedroom/bathroom Home Equipment: Other (comment) Additional Comments: believes still has 3:1 and RW from TKA, but will have family check storage unit    Prior Function Level of Independence: Independent               Hand Dominance        Extremity/Trunk Assessment   Upper Extremity Assessment Upper Extremity Assessment: Overall WFL for tasks assessed    Lower Extremity Assessment Lower Extremity Assessment: LLE deficits/detail LLE Deficits / Details: able to flex knee aboutr 50 degrees, but painful, used brace for ambulation  Communication   Communication: No difficulties  Cognition Arousal/Alertness: Awake/alert Behavior During Therapy: WFL for tasks assessed/performed Overall Cognitive Status: Within Functional Limits for tasks assessed                                        General Comments      Exercises     Assessment/Plan    PT Assessment Patient needs continued PT services  PT Problem List Decreased activity  tolerance;Decreased range of motion;Pain;Decreased knowledge of use of DME;Decreased balance;Cardiopulmonary status limiting activity       PT Treatment Interventions DME instruction;Therapeutic activities;Balance training;Patient/family education;Therapeutic exercise;Functional mobility training;Gait training    PT Goals (Current goals can be found in the Care Plan section)  Acute Rehab PT Goals Patient Stated Goal: to return to independent PT Goal Formulation: With patient Time For Goal Achievement: 03/12/19 Potential to Achieve Goals: Good    Frequency Min 3X/week   Barriers to discharge        Co-evaluation               AM-PAC PT "6 Clicks" Mobility  Outcome Measure Help needed turning from your back to your side while in a flat bed without using bedrails?: None Help needed moving from lying on your back to sitting on the side of a flat bed without using bedrails?: None Help needed moving to and from a bed to a chair (including a wheelchair)?: A Little Help needed standing up from a chair using your arms (e.g., wheelchair or bedside chair)?: A Little Help needed to walk in hospital room?: A Little Help needed climbing 3-5 steps with a railing? : A Little 6 Click Score: 20    End of Session Equipment Utilized During Treatment: Gait belt Activity Tolerance: Patient tolerated treatment well Patient left: in chair;with call bell/phone within reach   PT Visit Diagnosis: Other abnormalities of gait and mobility (R26.89);History of falling (Z91.81)    Time: 9794-8016 PT Time Calculation (min) (ACUTE ONLY): 27 min   Charges:   PT Evaluation $PT Eval Moderate Complexity: 1 Mod PT Treatments $Gait Training: 8-22 mins        Magda Kiel, [place] [facility] [phone] 03/05/2019   Reginia Naas 03/05/2019, 5:36 PM

## 2019-03-05 NOTE — Consult Note (Signed)
Reason for Consult:Left knee pain after fall Referring Physician: Ardelia Mems MD  [name] is an 73 y.o. female.  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  She has a history of remote TKA which was doing well prior to the fall.  She denies other complaints. She reports mild left knee pain.Marland Kitchen increased with activity and weight bearing.  Past Medical History:  Diagnosis Date  . Arthritis    osteoarthritis  . Bursitis of right shoulder   . Chronic cholecystitis with calculus 05/04/2013  . 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 Diabetes type 2, controlled ([facility]) 05/04/2013  . Fatty liver disease, nonalcoholic 9371   hospitalized, MRCP dx fatty liver  . GERD (gastroesophageal reflux disease)   . History of hypertension 08/20/2016  . Hormone replacement therapy (postmenopausal)   . Hormone replacement therapy (postmenopausal)   . Hyperlipidemia   . Hypertension   . Hypertensive urgency 07/11/2018  . Hypokalemia 07/11/2018  . Malnutrition of moderate degree ([facility]) 03/18/2014  . Nausea & vomiting 07/11/2018  . Normal cardiac stress test    pt can't remember when or where  . Obesity, unspecified 12/27/2012  . Rheumatoid arthritis(714.0) 09/28/2012  . Sepsis ([facility]) 03/16/2014  . SIRS (systemic inflammatory response syndrome) ([facility]) 07/11/2018  . 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 Unspecified essential hypertension 05/04/2013    Past Surgical History:  Procedure Laterality Date  . ABDOMINAL HYSTERECTOMY    . CHOLECYSTECTOMY N/A 05/04/2013   Procedure: LAPAROSCOPIC CHOLECYSTECTOMY WITH INTRAOPERATIVE CHOLANGIOGRAM;  Surgeon: Haywood Lasso, MD;  Location: [facility];  Service: General;  Laterality: N/A;  . ERCP N/A 05/02/2013   Procedure: ENDOSCOPIC RETROGRADE CHOLANGIOPANCREATOGRAPHY (ERCP);  Surgeon: Beryle Beams, MD;  Location: [facility];  Service: Endoscopy;   Laterality: N/A;  . JOINT REPLACEMENT     left knee  . NASAL SINUS SURGERY    . SPHINCTEROTOMY  05/02/2013   Procedure: SPHINCTEROTOMY;  Surgeon: Beryle Beams, MD;  Location: [facility];  Service: Endoscopy;;  . TONSILLECTOMY    . TOTAL KNEE ARTHROPLASTY  08/01/2012   Procedure: TOTAL KNEE ARTHROPLASTY;  Surgeon: Meredith Pel, MD;  Location: [facility];  Service: Orthopedics;  Laterality: Right;  Right total knee arthroplasty  . TOTAL KNEE ARTHROPLASTY  08/01/2012   RIGHT  KNEE    Family History  Problem Relation Age of Onset  . Cancer Mother        breast  . Heart attack Mother   . Stroke Father   . Diabetes Sister   . Diabetes Daughter     Social History:  reports that she has quit smoking. Her smoking use included cigarettes. She has a 0.50 pack-year smoking history. She has never used smokeless tobacco. She reports current alcohol use. She reports that she does not use drugs.  Allergies:  Allergies  Allergen Reactions  . Magnesium-Containing Compounds Nausea Only and Other (See Comments)    Re: Mag-Ox at higher doses caused a lot of stomach upset (dose had to be decreased)  . Monopril [Fosinopril] Cough  . Norvasc [Amlodipine Besylate] Other (See Comments)    10 mg dose caused LE edema- can tolerate 5 mg dose    Medications: I have reviewed the patient's current medications.  Results for orders placed or performed during the hospital encounter of 03/03/19 (from the past 48 hour(s))  Comprehensive metabolic panel  Status: Abnormal   Collection Time: 03/03/19 11:25 AM  Result Value Ref Range   Sodium 138 135 - 145 mmol/L   Potassium 3.0 (L) 3.5 - 5.1 mmol/L   Chloride 101 98 - 111 mmol/L   CO2 22 22 - 32 mmol/L   Glucose, Bld 190 (H) 70 - 99 mg/dL   BUN 32 (H) 8 - 23 mg/dL   Creatinine, Ser 1.07 (H) 0.44 - 1.00 mg/dL   Calcium 8.7 (L) 8.9 - 10.3 mg/dL   Total Protein 6.7 6.5 - 8.1 g/dL   Albumin 3.3 (L) 3.5 - 5.0 g/dL   AST 60 (H) 15 - 41 U/L   ALT 40 0 -  44 U/L   Alkaline Phosphatase 113 38 - 126 U/L   Total Bilirubin 1.2 0.3 - 1.2 mg/dL   GFR calc non Af Amer 51 (L) >60 mL/min   GFR calc Af Amer 59 (L) >60 mL/min   Anion gap 15 5 - 15    Comment: Performed at [facility] [address] [place], [place] [address]  Lipase, blood     Status: None   Collection Time: 03/03/19 11:25 AM  Result Value Ref Range   Lipase 30 11 - 51 U/L    Comment: Performed at [facility] [address], [place], [place] [address]  Troponin I (High Sensitivity)     Status: Abnormal   Collection Time: 03/03/19 11:25 AM  Result Value Ref Range   Troponin I (High Sensitivity) 64 (H) <18 ng/L    Comment: (NOTE) Elevated high sensitivity troponin I (hsTnI) values and significant  changes across serial measurements may suggest ACS but many other  chronic and acute conditions are known to elevate hsTnI results.  Refer to the "Links" section for chest pain algorithms and additional  guidance. Performed at [facility], [place] [address] [place], [place] [address]   CBC with Differential     Status: Abnormal   Collection Time: 03/03/19 11:25 AM  Result Value Ref Range   WBC 12.1 (H) 4.0 - 10.5 K/uL   RBC 5.06 3.87 - 5.11 MIL/uL   Hemoglobin 16.2 (H) 12.0 - 15.0 g/dL   HCT 48.7 (H) 36.0 - 46.0 %   MCV 96.2 80.0 - 100.0 fL   MCH 32.0 26.0 - 34.0 pg   MCHC 33.3 30.0 - 36.0 g/dL   RDW 14.4 11.5 - 15.5 %   Platelets 237 150 - 400 K/uL   nRBC 0.0 0.0 - 0.2 %   Neutrophils Relative % 76 %   Neutro Abs 9.2 (H) 1.7 - 7.7 K/uL   Lymphocytes Relative 12 %   Lymphs Abs 1.5 0.7 - 4.0 K/uL   Monocytes Relative 11 %   Monocytes Absolute 1.3 (H) 0.1 - 1.0 K/uL   Eosinophils Relative 0 %   Eosinophils Absolute 0.0 0.0 - 0.5 K/uL   Basophils Relative 0 %   Basophils Absolute 0.0 0.0 - 0.1 K/uL   Immature Granulocytes 1 %   Abs Immature Granulocytes 0.07 0.00 - 0.07 K/uL    Comment: Performed at [facility], [place] [address], [place],   [address]  Protime-INR     Status: None   Collection Time: 03/03/19 11:25 AM  Result Value Ref Range   Prothrombin Time 14.6 11.4 - 15.2 seconds   INR 1.2 0.8 - 1.2    Comment: (NOTE) INR goal varies based on device and disease states. Performed at [facility], [address], [place], [place] [address]   Magnesium     Status: Abnormal   Collection Time: 03/03/19 11:25 AM  Result Value Ref Range   Magnesium 1.1 (L) 1.7 - 2.4 mg/dL    Comment: Performed at [facility] [address], [place], [place] [address]  Phosphorus     Status: None   Collection Time: 03/03/19 11:25 AM  Result Value Ref Range   Phosphorus 3.0 2.5 - 4.6 mg/dL    Comment: Performed at [facility] [address], [place], [place] [address]  Brain natriuretic peptide     Status: Abnormal   Collection Time: 03/03/19 11:25 AM  Result Value Ref Range   B Natriuretic Peptide 341.9 (H) 0.0 - 100.0 pg/mL    Comment: Performed at [facility] [address], [place], [place] [address]  POCT I-Stat EG7     Status: Abnormal   Collection Time: 03/03/19 11:38 AM  Result Value Ref Range   pH, Ven 7.525 (H) 7.250 - 7.430   pCO2, Ven 33.4 (L) 44.0 - 60.0 mmHg   pO2, Ven 164.0 (H) 32.0 - 45.0 mmHg   Bicarbonate 27.6 20.0 - 28.0 mmol/L   TCO2 29 22 - 32 mmol/L   O2 Saturation 100.0 %   Acid-Base Excess 5.0 (H) 0.0 - 2.0 mmol/L   Sodium 137 135 - 145 mmol/L   Potassium 3.0 (L) 3.5 - 5.1 mmol/L   Calcium, Ion 0.88 (LL) 1.15 - 1.40 mmol/L   HCT 49.0 (H) 36.0 - 46.0 %   Hemoglobin 16.7 (H) 12.0 - 15.0 g/dL   Patient temperature HIDE    Sample type VENOUS    Comment NOTIFIED PHYSICIAN   Culture, blood (routine x 2)     Status: None (Preliminary result)   Collection Time: 03/03/19 11:53 AM   Specimen: BLOOD RIGHT HAND  Result Value Ref Range   Specimen Description BLOOD RIGHT HAND    Special Requests      BOTTLES DRAWN AEROBIC AND ANAEROBIC Blood Culture adequate volume   Culture      NO  GROWTH 1 DAY Performed at [facility], [address] [place], [place] [address]    Report Status PENDING   Lactic acid, plasma     Status: Abnormal   Collection Time: 03/03/19 11:54 AM  Result Value Ref Range   Lactic Acid, Venous 2.3 (HH) 0.5 - 1.9 mmol/L    Comment: CRITICAL RESULT CALLED TO, READ BACK BY AND VERIFIED WITH: C.CARLAN,RN @ 1309 03/03/2019 [place] Performed at [facility], [place] [address] [place], [place] [address]   TSH     Status: None   Collection Time: 03/03/19 11:54 AM  Result Value Ref Range   TSH 3.788 0.350 - 4.500 uIU/mL    Comment: Performed by a 3rd Generation assay with a functional sensitivity of <=0.01 uIU/mL. Performed at [facility], [place] [address], [place], [place] [address]   Culture, blood (routine x 2)     Status: None (Preliminary result)   Collection Time: 03/03/19 12:52 PM   Specimen: BLOOD  Result Value Ref Range   Specimen Description BLOOD RIGHT ANTECUBITAL    Special Requests      BOTTLES DRAWN AEROBIC AND ANAEROBIC Blood Culture results may not be optimal due to an inadequate volume of blood received in culture bottles   Culture      NO GROWTH 1 DAY Performed at [facility],  [address], [place], [place] [address]    Report Status PENDING   SARS Coronavirus 2 (CEPHEID- Performed in [facility] lab), Hosp Order     Status: None   Collection Time: 03/03/19  1:54 PM   Specimen: Nasopharyngeal Swab  Result Value Ref Range   SARS Coronavirus 2 NEGATIVE NEGATIVE    Comment: (NOTE) If result is NEGATIVE SARS-CoV-2 target nucleic acids are NOT DETECTED. The SARS-CoV-2 RNA is generally detectable in upper and lower  respiratory specimens during the acute phase of infection. The lowest  concentration of SARS-CoV-2 viral copies this assay can detect is 250  copies / mL. A negative result does not preclude SARS-CoV-2 infection  and should not be used as the sole basis for treatment or other  patient  management decisions.  A negative result may occur with  improper specimen collection / handling, submission of specimen other  than nasopharyngeal swab, presence of viral mutation(s) within the  areas targeted by this assay, and inadequate number of viral copies  (<250 copies / mL). A negative result must be combined with clinical  observations, patient history, and epidemiological information. If result is POSITIVE SARS-CoV-2 target nucleic acids are DETECTED. The SARS-CoV-2 RNA is generally detectable in upper and lower  respiratory specimens dur ing the acute phase of infection.  Positive  results are indicative of active infection with SARS-CoV-2.  Clinical  correlation with patient history and other diagnostic information is  necessary to determine patient infection status.  Positive results do  not rule out bacterial infection or co-infection with other viruses. If result is PRESUMPTIVE POSTIVE SARS-CoV-2 nucleic acids MAY BE PRESENT.   A presumptive positive result was obtained on the submitted specimen  and confirmed on repeat testing.  While 2019 novel coronavirus  (SARS-CoV-2) nucleic acids may be present in the submitted sample  additional confirmatory testing may be necessary for epidemiological  and / or clinical management purposes  to differentiate between  SARS-CoV-2 and other Sarbecovirus currently known to infect humans.  If clinically indicated additional testing with an alternate test  methodology [phone]) is advised. The SARS-CoV-2 RNA is generally  detectable in upper and lower respiratory sp ecimens during the acute  phase of infection. The expected result is Negative. Fact Sheet for Patients:  StrictlyIdeas.no Fact Sheet for Healthcare Providers: BankingDealers.co.za This test is not yet approved or cleared by the [place] FDA and has been authorized for detection and/or diagnosis of SARS-CoV-2 by FDA under  an Emergency Use Authorization (EUA).  This EUA will remain in effect (meaning this test can be used) for the duration of the COVID-19 declaration under Section 564(b)(1) of the Act, 21 U.S.C. section 360bbb-3(b)(1), unless the authorization is terminated or revoked sooner. Performed at [facility], [place] [address] [place], [place] [address]   Urinalysis, Routine w reflex microscopic     Status: Abnormal   Collection Time: 03/03/19  3:11 PM  Result Value Ref Range   Color, Urine YELLOW YELLOW   APPearance HAZY (A) CLEAR   Specific Gravity, Urine 1.013 1.005 - 1.030   pH 7.0 5.0 - 8.0   Glucose, UA NEGATIVE NEGATIVE mg/dL   Hgb urine dipstick SMALL (A) NEGATIVE   Bilirubin Urine NEGATIVE NEGATIVE   Ketones, ur 5 (A) NEGATIVE mg/dL   Protein, ur 100 (A) NEGATIVE mg/dL   Nitrite NEGATIVE NEGATIVE   Leukocytes,Ua MODERATE (A) NEGATIVE   RBC / HPF 0-5 0 - 5 RBC/hpf   WBC, UA 11-20  0 - 5 WBC/hpf   Bacteria, UA NONE SEEN NONE SEEN   Squamous Epithelial / LPF 0-5 0 - 5   Mucus PRESENT    Non Squamous Epithelial 0-5 (A) NONE SEEN    Comment: Performed at [facility], [place] [address], [place], [place] [address]  Urine rapid drug screen (hosp performed)     Status: None   Collection Time: 03/03/19  3:12 PM  Result Value Ref Range   Opiates NONE DETECTED NONE DETECTED   Cocaine NONE DETECTED NONE DETECTED   Benzodiazepines NONE DETECTED NONE DETECTED   Amphetamines NONE DETECTED NONE DETECTED   Tetrahydrocannabinol NONE DETECTED NONE DETECTED   Barbiturates NONE DETECTED NONE DETECTED    Comment: (NOTE) DRUG SCREEN FOR MEDICAL PURPOSES ONLY.  IF CONFIRMATION IS NEEDED FOR ANY PURPOSE, NOTIFY LAB WITHIN 5 DAYS. LOWEST DETECTABLE LIMITS FOR URINE DRUG SCREEN Drug Class                     Cutoff (ng/mL) Amphetamine and metabolites    1000 Barbiturate and metabolites    200 Benzodiazepine                 240 Tricyclics and metabolites     300 Opiates and metabolites         300 Cocaine and metabolites        300 THC                            50 Performed at [facility], [place] [address] [place], [place] [address]   Lactic acid, plasma     Status: None   Collection Time: 03/03/19  4:18 PM  Result Value Ref Range   Lactic Acid, Venous 1.7 0.5 - 1.9 mmol/L    Comment: Performed at [facility] [address] [place], [place] [address]  Troponin I (High Sensitivity)     Status: Abnormal   Collection Time: 03/03/19  4:18 PM  Result Value Ref Range   Troponin I (High Sensitivity) 78 (H) <18 ng/L    Comment: (NOTE) Elevated high sensitivity troponin I (hsTnI) values and significant  changes across serial measurements may suggest ACS but many other  chronic and acute conditions are known to elevate hsTnI results.  Refer to the "Links" section for chest pain algorithms and additional  guidance. Performed at [facility], [place] [address], [place], [place] [address]   Hemoglobin A1c     Status: Abnormal   Collection Time: 03/03/19  4:18 PM  Result Value Ref Range   Hgb A1c MFr Bld 6.4 (H) 4.8 - 5.6 %    Comment: (NOTE) Pre diabetes:          5.7%-6.4% Diabetes:              >6.4% Glycemic control for   <7.0% adults with diabetes    Mean Plasma Glucose 136.98 mg/dL    Comment: Performed at [facility] [address] [place], [place] [address]  Troponin I (High Sensitivity)     Status: Abnormal   Collection Time: 03/03/19  6:26 PM  Result Value Ref Range   Troponin I (High Sensitivity) 93 (H) <18 ng/L    Comment: (NOTE) Elevated high sensitivity troponin I (hsTnI) values and significant  changes across serial measurements may suggest ACS but many other  chronic and acute conditions are known to elevate hsTnI results.  Refer to the "Links" section for chest pain algorithms and additional  guidance. Performed at [facility], [place] [address], [place], [place] [address]   Glucose, capillary     Status: Abnormal    Collection Time: 03/03/19  8:35 PM  Result Value Ref Range   Glucose-Capillary 135 (H) 70 - 99 mg/dL  Troponin I (High Sensitivity)     Status: Abnormal   Collection Time: 03/03/19  8:47 PM  Result Value Ref Range   Troponin I (High Sensitivity) 93 (H) <18 ng/L    Comment: (NOTE) Elevated high sensitivity troponin I (hsTnI) values and significant  changes across serial measurements may suggest ACS but many other  chronic and acute conditions are known to elevate hsTnI results.  Refer to the "Links" section for chest pain algorithms and additional  guidance. Performed at [facility], [place] [address] [place], [place] [address]   Basic metabolic panel     Status: Abnormal   Collection Time: 03/04/19  5:01 AM  Result Value Ref Range   Sodium 137 135 - 145 mmol/L   Potassium 3.2 (L) 3.5 - 5.1 mmol/L   Chloride 104 98 - 111 mmol/L   CO2 23 22 - 32 mmol/L   Glucose, Bld 137 (H) 70 - 99 mg/dL   BUN 18 8 - 23 mg/dL   Creatinine, Ser 0.79 0.44 - 1.00 mg/dL   Calcium 7.6 (L) 8.9 - 10.3 mg/dL   GFR calc non Af Amer >60 >60 mL/min   GFR calc Af Amer >60 >60 mL/min   Anion gap 10 5 - 15    Comment: Performed at [facility] [address] [place], [place] [address]  Heparin level (unfractionated)     Status: None   Collection Time: 03/04/19  5:01 AM  Result Value Ref Range   Heparin Unfractionated 0.54 0.30 - 0.70 IU/mL    Comment: (NOTE) If heparin results are below expected values, and patient dosage has  been confirmed, suggest follow up testing of antithrombin III levels. Performed at [facility], [place] [address] [place], [place] [address]   CBC     Status: Abnormal   Collection Time: 03/04/19  5:01 AM  Result Value Ref Range   WBC 10.7 (H) 4.0 - 10.5 K/uL   RBC 4.54 3.87 - 5.11 MIL/uL   Hemoglobin 14.4 12.0 - 15.0 g/dL   HCT 43.7 36.0 - 46.0 %   MCV 96.3 80.0 - 100.0 fL   MCH 31.7 26.0 - 34.0 pg   MCHC 33.0 30.0 - 36.0 g/dL   RDW 14.3 11.5 - 15.5 %    Platelets 177 150 - 400 K/uL   nRBC 0.0 0.0 - 0.2 %    Comment: Performed at [facility], [place] [address] [place], [place] [address]  Glucose, capillary     Status: Abnormal   Collection Time: 03/04/19  8:03 AM  Result Value Ref Range   Glucose-Capillary 142 (H) 70 - 99 mg/dL  Gastrointestinal Panel by PCR , Stool     Status: None   Collection Time: 03/04/19  9:15 AM   Specimen: Stool  Result Value Ref Range   Campylobacter species NOT DETECTED NOT DETECTED   Plesimonas shigelloides NOT DETECTED NOT DETECTED   Salmonella species NOT DETECTED NOT DETECTED   Yersinia enterocolitica NOT DETECTED NOT DETECTED   Vibrio species NOT DETECTED NOT DETECTED   Vibrio cholerae NOT DETECTED NOT DETECTED   Enteroaggregative E coli (  EAEC) NOT DETECTED NOT DETECTED   Enteropathogenic E coli (EPEC) NOT DETECTED NOT DETECTED   Enterotoxigenic E coli (ETEC) NOT DETECTED NOT DETECTED   Shiga like toxin producing E coli (STEC) NOT DETECTED NOT DETECTED   Shigella/Enteroinvasive E coli (EIEC) NOT DETECTED NOT DETECTED   Cryptosporidium NOT DETECTED NOT DETECTED   Cyclospora c[place]anensis NOT DETECTED NOT DETECTED   Entamoeba histolytica NOT DETECTED NOT DETECTED   Giardia lamblia NOT DETECTED NOT DETECTED   Adenovirus F40/41 NOT DETECTED NOT DETECTED   Astrovirus NOT DETECTED NOT DETECTED   Norovirus GI/GII NOT DETECTED NOT DETECTED   Rotavirus A NOT DETECTED NOT DETECTED   Sapovirus (I, II, IV, and V) NOT DETECTED NOT DETECTED    Comment: Performed at [facility], [place] [place], [place] [address]  Magnesium     Status: Abnormal   Collection Time: 03/04/19 10:33 AM  Result Value Ref Range   Magnesium 1.4 (L) 1.7 - 2.4 mg/dL    Comment: Performed at [facility] [address], [place], [place] [address]  Troponin I (High Sensitivity)     Status: Abnormal   Collection Time: 03/04/19 10:33 AM  Result Value Ref Range   Troponin I (High Sensitivity) 51 (H) <18 ng/L     Comment: (NOTE) Elevated high sensitivity troponin I (hsTnI) values and significant  changes across serial measurements may suggest ACS but many other  chronic and acute conditions are known to elevate hsTnI results.  Refer to the Links section for chest pain algorithms and additional  guidance. Performed at [facility], [place] [address] [place], [place] [address]   Glucose, capillary     Status: Abnormal   Collection Time: 03/04/19 11:57 AM  Result Value Ref Range   Glucose-Capillary 139 (H) 70 - 99 mg/dL  Glucose, capillary     Status: Abnormal   Collection Time: 03/04/19  4:53 PM  Result Value Ref Range   Glucose-Capillary 159 (H) 70 - 99 mg/dL  Glucose, capillary     Status: Abnormal   Collection Time: 03/04/19 10:04 PM  Result Value Ref Range   Glucose-Capillary 159 (H) 70 - 99 mg/dL  CBC     Status: Abnormal   Collection Time: 03/05/19 12:21 AM  Result Value Ref Range   WBC 8.1 4.0 - 10.5 K/uL   RBC 4.15 3.87 - 5.11 MIL/uL   Hemoglobin 13.2 12.0 - 15.0 g/dL   HCT 40.4 36.0 - 46.0 %   MCV 97.3 80.0 - 100.0 fL   MCH 31.8 26.0 - 34.0 pg   MCHC 32.7 30.0 - 36.0 g/dL   RDW 14.4 11.5 - 15.5 %   Platelets 143 (L) 150 - 400 K/uL   nRBC 0.0 0.0 - 0.2 %    Comment: Performed at [facility], [place] [address] [place], [place] [address]  Troponin I (High Sensitivity)     Status: Abnormal   Collection Time: 03/05/19 12:21 AM  Result Value Ref Range   Troponin I (High Sensitivity) 28 (H) <18 ng/L    Comment: DELTA CHECK NOTED (NOTE) Elevated high sensitivity troponin I (hsTnI) values and significant  changes across serial measurements may suggest ACS but many other  chronic and acute conditions are known to elevate hsTnI results.  Refer to the Links section for chest pain algorithms and additional  guidance. Performed at [facility], [place] [address], [place], [place] [address]   Basic metabolic panel     Status:  Abnormal   Collection Time: 03/05/19 12:21 AM   Result Value Ref Range   Sodium 138 135 - 145 mmol/L   Potassium 3.8 3.5 - 5.1 mmol/L   Chloride 107 98 - 111 mmol/L   CO2 23 22 - 32 mmol/L   Glucose, Bld 183 (H) 70 - 99 mg/dL   BUN 20 8 - 23 mg/dL   Creatinine, Ser 0.95 0.44 - 1.00 mg/dL   Calcium 7.7 (L) 8.9 - 10.3 mg/dL   GFR calc non Af Amer 59 (L) >60 mL/min   GFR calc Af Amer >60 >60 mL/min   Anion gap 8 5 - 15    Comment: Performed at [facility] [address], [place], [place] [address]  Magnesium     Status: None   Collection Time: 03/05/19 12:21 AM  Result Value Ref Range   Magnesium 1.7 1.7 - 2.4 mg/dL    Comment: Performed at [facility] [address], [place], [place] [address]  Glucose, capillary     Status: Abnormal   Collection Time: 03/05/19  7:36 AM  Result Value Ref Range   Glucose-Capillary 140 (H) 70 - 99 mg/dL    Dg Orthopantogram  Result Date: 03/03/2019 CLINICAL DATA:  Recent fall with jaw pain, initial encounter EXAM: ORTHOPANTOGRAM/PANORAMIC COMPARISON:  None. FINDINGS: Panoramic view of the mandible was obtained and reveals no acute fracture. No temporomandibular joint dislocation is seen. No acute dental abnormality is noted. Dental caries are seen particularly in the maxillary molars. IMPRESSION: No acute bony abnormality noted. Electronically Signed   By: Inez Catalina M.D.   On: 03/03/2019 18:05   Ct Knee Left Wo Contrast  Result Date: 03/04/2019 CLINICAL DATA:  The patient suffered a fall and blow to the left knee do a syncopal episode 03/03/2019. Possible fracture on plain films. Initial encounter. EXAM: CT OF THE LEFT KNEE WITHOUT CONTRAST TECHNIQUE: Multidetector CT imaging of the left knee was performed according to the standard protocol. Multiplanar CT image reconstructions were also generated. COMPARISON:  Plain films left knee 03/03/2019. 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 Possible tiny chip fracture  adjacent to the lateral aspect of the femoral component is not visible on this exam. No fracture is identified. Hardware is intact without evidence of loosening or other complicating feature. Ligaments Suboptimally assessed by CT. Muscles and Tendons Intact. Soft tissues Small joint effusion noted.  No lipohemarthrosis IMPRESSION: No acute abnormality. Possible tiny chip fracture off the lateral femur is not visible on this examination but could be obscured by streak artifact. However, there is no lipohemarthrosis as typically seen and knee fractures. Small joint effusion. Electronically Signed   By: Inge Rise M.D.   On: 03/04/2019 16:51   Dg Chest Port 1 View  Result Date: 03/03/2019 CLINICAL DATA:  Atrial fibrillation. EXAM: PORTABLE CHEST 1 VIEW COMPARISON:  Chest radiograph June 03, 2016 FINDINGS: Cardiomediastinal silhouette is normal. Mediastinal contours appear intact. Calcific atherosclerotic disease of the aorta. There is no evidence of focal airspace consolidation, pleural effusion or pneumothorax. Osseous structures are without acute abnormality. Soft tissues are grossly normal. IMPRESSION: No active disease. Electronically Signed   By: Fidela Salisbury M.D.   On: 03/03/2019 12:29   Dg Knee Complete 4 Views Left  Result Date: 03/03/2019 CLINICAL DATA:  Pain after fall EXAM: LEFT KNEE - COMPLETE 4+ VIEW COMPARISON:  None. FINDINGS: The patient is status post left knee replacement. Hardware is  in good position. Vascular calcifications. Small joint effusion. There is a cortical defect in the distal femur adjacent to the lateral aspect of the femoral component of the knee replacement. No other evidence of fracture. IMPRESSION: 1. There is a cortical defect in the distal femur adjacent to the lateral aspect of the femoral component of the knee replacement. This is an age-indeterminate finding. A subtle acute fracture in this region is not excluded. No other evidence of fracture.  Electronically Signed   By: Dorise Bullion III M.D   On: 03/03/2019 18:07    ROS Blood pressure (!) 146/93, pulse (!) 130, temperature 99.1 F (37.3 C), temperature source Oral, resp. rate 17, height 4\' 10"  (1.473 m), weight 72.8 kg, SpO2 91 %. Physical Exam Healthy appearing elderly female in no acute distress. No neck pain with AROM. CTL spine non tender with no step offs Bilateral UEs pain free AROM and 5/5 motor strength Right LE with well healed midline incision and no knee swelling or tenderness Left knee with well healed incision. No evidence of infection, minimal swelling and very mild tenderness lateral knee Stable knee to varus and valgus anterior and posterior drawer testing. Full extension with good quad. Patellar tracking normal No calf swelling or tenderness, Neg homans Distally NVI bilateral LEs  Assessment/Plan: Left knee pain after fall in the setting of remote TKA with small posterior lateral fracture which may represent an avulsion injury from the capsule. No fractures seen on CT scan Would recommend WBAT with the walker and initially with the knee immobilizer until her balance and strength returns then the brace can be removed. Follow up with me in two weeks if having any continued pain.  Augustin Schooling 03/05/2019, 10:58 AM

## 2019-03-05 NOTE — Care Management (Signed)
Per Maud Deed  W/INVISION- co-pay amount for Eliquis 5 mg twice a day is $45.00. Teir 3 medication, No PA required, No deductible to be met. Pt. may use  any pharmacy. GENERIC BRAND( Apixaban is not on their formulary)

## 2019-03-05 NOTE — Progress Notes (Signed)
ORTHO INTERIM NOTE  CT Reviewed which shows no significant periprosthetic fracture. May be up and WBAT on the knee with the brace on. Full consult to follow  Netta Cedars MD [facility]

## 2019-03-05 NOTE — Progress Notes (Signed)
Patient is feeling much better since given the Protonix, will continue to monitor.

## 2019-03-05 NOTE — Progress Notes (Signed)
MD came up to see patient and EKG obtained which he was able to see, will order cardiac enzymes again and some additional AM labs and her Protonix for acid reflex, no other changes noted, will continue to monitor.

## 2019-03-05 NOTE — Progress Notes (Signed)
Progress Note  Patient Name: [name] Date of Encounter: 03/05/2019  Primary Cardiologist: Dr [name]  Subjective   No CP or dyspnea  Inpatient Medications    Scheduled Meds: . apixaban  5 mg Oral BID  . aspirin EC  81 mg Oral Daily  . atorvastatin  10 mg Oral Daily  . cloNIDine  0.1 mg Oral BID  . diltiazem  120 mg Oral Daily  . leflunomide  10 mg Oral Daily  . levothyroxine  25 mcg Oral Q0600  . metoprolol tartrate  12.5 mg Oral BID  . pantoprazole  40 mg Oral Daily  . potassium chloride SA  40 mEq Oral BID   Continuous Infusions:  PRN Meds: acetaminophen **OR** acetaminophen, alum & mag hydroxide-simeth, Melatonin, ondansetron (ZOFRAN) IV   Vital Signs    Vitals:   03/05/19 0620 03/05/19 0630 03/05/19 0843 03/05/19 0844  BP:  99/76 (!) 146/93   Pulse:  99  (!) 130  Resp:  17    Temp:      TempSrc:      SpO2:  91%    Weight: 72.8 kg     Height:        Intake/Output Summary (Last 24 hours) at 03/05/2019 0909 Last data filed at 03/05/2019 0600 Gross per 24 hour  Intake 978.01 ml  Output 300 ml  Net 678.01 ml   Last 3 Weights 03/05/2019 03/04/2019 03/03/2019  Weight (lbs) 160 lb 9.6 oz 163 lb 9.3 oz 160 lb 1.6 oz  Weight (kg) 72.848 kg 74.2 kg 72.621 kg      Telemetry    Atrial fibrilltation with elevated rate- Personally Reviewed   Physical Exam   GEN: No acute distress.   Neck: No JVD Cardiac: irregular and tachycardic Respiratory: Clear to auscultation bilaterally. GI: Soft, nontender, non-distended  MS: No edema Neuro:  Nonfocal  Psych: Normal affect   Labs    High Sensitivity Troponin:   Recent Labs  Lab 03/03/19 1618 03/03/19 1826 03/03/19 2047 03/04/19 1033 03/05/19 0021  TROPONINIHS 78* 93* 93* 51* 28*      Chemistry Recent Labs  Lab 03/03/19 1125 03/03/19 1138 03/04/19 0501 03/05/19 0021  NA 138 137 137 138  K 3.0* 3.0* 3.2* 3.8  CL 101  --  104 107  CO2 22  --  23 23  GLUCOSE 190*  --  137* 183*  BUN 32*  --  18  20  CREATININE 1.07*  --  0.79 0.95  CALCIUM 8.7*  --  7.6* 7.7*  PROT 6.7  --   --   --   ALBUMIN 3.3*  --   --   --   AST 60*  --   --   --   ALT 40  --   --   --   ALKPHOS 113  --   --   --   BILITOT 1.2  --   --   --   GFRNONAA 51*  --  >60 59*  GFRAA 59*  --  >60 >60  ANIONGAP 15  --  10 8     Hematology Recent Labs  Lab 03/03/19 1125 03/03/19 1138 03/04/19 0501 03/05/19 0021  WBC 12.1*  --  10.7* 8.1  RBC 5.06  --  4.54 4.15  HGB 16.2* 16.7* 14.4 13.2  HCT 48.7* 49.0* 43.7 40.4  MCV 96.2  --  96.3 97.3  MCH 32.0  --  31.7 31.8  MCHC 33.3  --  33.0 32.7  RDW 14.4  --  14.3 14.4  PLT 237  --  177 143*    BNP Recent Labs  Lab 03/03/19 1125  BNP 341.9*      Radiology    Dg Orthopantogram  Result Date: 03/03/2019 CLINICAL DATA:  Recent fall with jaw pain, initial encounter EXAM: ORTHOPANTOGRAM/PANORAMIC COMPARISON:  None. FINDINGS: Panoramic view of the mandible was obtained and reveals no acute fracture. No temporomandibular joint dislocation is seen. No acute dental abnormality is noted. Dental caries are seen particularly in the maxillary molars. IMPRESSION: No acute bony abnormality noted. Electronically Signed   By: Inez Catalina M.D.   On: 03/03/2019 18:05   Ct Knee Left Wo Contrast  Result Date: 03/04/2019 CLINICAL DATA:  The patient suffered a fall and blow to the left knee do a syncopal episode 03/03/2019. Possible fracture on plain films. Initial encounter. EXAM: CT OF THE LEFT KNEE WITHOUT CONTRAST TECHNIQUE: Multidetector CT imaging of the left knee was performed according to the standard protocol. Multiplanar CT image reconstructions were also generated. COMPARISON:  Plain films left knee 03/03/2019. 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 Possible tiny chip fracture adjacent to the lateral aspect of the femoral component is not visible on this exam. No fracture is identified.  Hardware is intact without evidence of loosening or other complicating feature. Ligaments Suboptimally assessed by CT. Muscles and Tendons Intact. Soft tissues Small joint effusion noted.  No lipohemarthrosis IMPRESSION: No acute abnormality. Possible tiny chip fracture off the lateral femur is not visible on this examination but could be obscured by streak artifact. However, there is no lipohemarthrosis as typically seen and knee fractures. Small joint effusion. Electronically Signed   By: Inge Rise M.D.   On: 03/04/2019 16:51   Dg Chest Port 1 View  Result Date: 03/03/2019 CLINICAL DATA:  Atrial fibrillation. EXAM: PORTABLE CHEST 1 VIEW COMPARISON:  Chest radiograph June 03, 2016 FINDINGS: Cardiomediastinal silhouette is normal. Mediastinal contours appear intact. Calcific atherosclerotic disease of the aorta. There is no evidence of focal airspace consolidation, pleural effusion or pneumothorax. Osseous structures are without acute abnormality. Soft tissues are grossly normal. IMPRESSION: No active disease. Electronically Signed   By: Fidela Salisbury M.D.   On: 03/03/2019 12:29   Dg Knee Complete 4 Views Left  Result Date: 03/03/2019 CLINICAL DATA:  Pain after fall EXAM: LEFT KNEE - COMPLETE 4+ VIEW COMPARISON:  None. FINDINGS: The patient is status post left knee replacement. Hardware is in good position. Vascular calcifications. Small joint effusion. There is a cortical defect in the distal femur adjacent to the lateral aspect of the femoral component of the knee replacement. No other evidence of fracture. IMPRESSION: 1. There is a cortical defect in the distal femur adjacent to the lateral aspect of the femoral component of the knee replacement. This is an age-indeterminate finding. A subtle acute fracture in this region is not excluded. No other evidence of fracture. Electronically Signed   By: Dorise Bullion III M.D   On: 03/03/2019 18:07    Cardiac Studies   Echo 7/20  1. The  left ventricle has normal systolic function with an ejection fraction of 60-65%. The cavity size was normal. There is mildly increased left ventricular wall thickness. Left ventricular diastolic Doppler parameters are consistent with  pseudonormalization.  2. The right ventricle has normal systolic function. The cavity was normal. There is no increase in right ventricular wall thickness.  3. Left atrial size  was mildly dilated.  4. No evidence of mitral valve stenosis.  5. Aortic valve regurgitation is mild by color flow Doppler. No stenosis of the aortic valve.  6. The interatrial septum was not assessed.  Patient Profile     [name] a 73 y.o.femalewith a hx of HTN, DM2, HLD, who was admitted for syncope in the setting of persistent n/vwho is being seenfor the evaluation of new AF.  Assessment & Plan    1 newly diagnosed atrial fibrillation-patient remains in atrial fibrillation duration unknown.  Echocardiogram shows normal LV function.  TSH is normal. CHADSvasc 4.  Continue apixaban.  Heart rate remains elevated.  Increase Cardizem to 240 mg daily.  Continue metoprolol.  She is asymptomatic.  If we can control heart rate she can be discharged and potentially proceed with cardioversion in approximately 4 weeks.  If we cannot control heart rate then we will arrange TEE guided cardioversion while she is here.  2 hypertension-I will hold clonidine to allow increased blood pressure to advance Cardizem and metoprolol for rate control.  Follow blood pressure and adjust regimen as needed.  3 syncope-symptoms are consistent with orthostasis.  LV function normal on echocardiogram.  4 minimally elevated troponin-no clear trend and no history of exertional chest pain.  This is felt to be demand ischemia in the setting of atrial fibrillation.  No plans for further evaluation.  For questions or updates, please contact [place] Please consult [email or web address] for contact info under         Signed, Kirk Ruths, MD  03/05/2019, 9:09 AM

## 2019-03-06 LAB — CBC
HCT: 43.6 % (ref 36.0–46.0)
Hemoglobin: 14.2 g/dL (ref 12.0–15.0)
MCH: 32.1 pg (ref 26.0–34.0)
MCHC: 32.6 g/dL (ref 30.0–36.0)
MCV: 98.4 fL (ref 80.0–100.0)
Platelets: 174 10*3/uL (ref 150–400)
RBC: 4.43 MIL/uL (ref 3.87–5.11)
RDW: 14.8 % (ref 11.5–15.5)
WBC: 11.1 10*3/uL — ABNORMAL HIGH (ref 4.0–10.5)
nRBC: 0 % (ref 0.0–0.2)

## 2019-03-06 LAB — BASIC METABOLIC PANEL
Anion gap: 10 (ref 5–15)
BUN: 22 mg/dL (ref 8–23)
CO2: 19 mmol/L — ABNORMAL LOW (ref 22–32)
Calcium: 8.9 mg/dL (ref 8.9–10.3)
Chloride: 109 mmol/L (ref 98–111)
Creatinine, Ser: 0.85 mg/dL (ref 0.44–1.00)
GFR calc Af Amer: >60 mL/min (ref >60)
GFR calc non Af Amer: >60 mL/min (ref >60)
Glucose, Bld: 126 mg/dL — ABNORMAL HIGH (ref 70–99)
Potassium: 6.6 mmol/L (ref 3.5–5.1)
Sodium: 138 mmol/L (ref 135–145)

## 2019-03-06 LAB — GLUCOSE, CAPILLARY
Glucose-Capillary: 133 mg/dL — ABNORMAL HIGH (ref 70–99)
Glucose-Capillary: 234 mg/dL — ABNORMAL HIGH (ref 70–99)
Glucose-Capillary: 162 mg/dL — ABNORMAL HIGH (ref 70–99)
Glucose-Capillary: 163 mg/dL — ABNORMAL HIGH (ref 70–99)

## 2019-03-06 LAB — POTASSIUM
Potassium: 5.9 mmol/L — ABNORMAL HIGH (ref 3.5–5.1)
Potassium: 4.7 mmol/L (ref 3.5–5.1)

## 2019-03-06 MED ORDER — INSULIN ASPART 100 UNIT/ML ~~LOC~~ SOLN
0.0000 [IU] | Freq: Three times a day (TID) | SUBCUTANEOUS | Status: DC
  Administered 2019-03-06: 2 [IU] via SUBCUTANEOUS
  Administered 2019-03-07: 3 [IU] via SUBCUTANEOUS
  Administered 2019-03-07: 2 [IU] via SUBCUTANEOUS
  Administered 2019-03-07: 1 [IU] via SUBCUTANEOUS

## 2019-03-06 MED ORDER — INSULIN ASPART 100 UNIT/ML ~~LOC~~ SOLN: 0.0000 [IU] | Freq: Every day | SUBCUTANEOUS | Status: DC

## 2019-03-06 MED ORDER — DILTIAZEM HCL ER COATED BEADS 180 MG PO CP24
360.0000 mg | ORAL_CAPSULE | Freq: Every day | ORAL | Status: DC
  Administered 2019-03-06 – 2019-03-07 (×2): 360 mg via ORAL
  Filled 2019-03-06 (×2): qty 2

## 2019-03-06 MED ORDER — FAMOTIDINE 20 MG PO TABS
20.0000 mg | ORAL_TABLET | Freq: Every day | ORAL | Status: DC
  Administered 2019-03-06 – 2019-03-07 (×2): 20 mg via ORAL
  Filled 2019-03-06 (×2): qty 1

## 2019-03-06 MED ORDER — METOPROLOL TARTRATE 25 MG PO TABS
25.0000 mg | ORAL_TABLET | Freq: Two times a day (BID) | ORAL | Status: DC
  Administered 2019-03-06 (×2): 25 mg via ORAL
  Filled 2019-03-06 (×2): qty 1

## 2019-03-06 NOTE — Progress Notes (Signed)
Progress Note  Patient Name: [name] Date of Encounter: 03/06/2019  Primary Cardiologist: Dr [name]  Subjective   Pt denies CP or dyspnea  Inpatient Medications    Scheduled Meds: . apixaban  5 mg Oral BID  . atorvastatin  10 mg Oral Daily  . diltiazem  240 mg Oral Daily  . leflunomide  10 mg Oral Daily  . levothyroxine  25 mcg Oral Q0600  . metoprolol tartrate  12.5 mg Oral BID  . pantoprazole  40 mg Oral Daily  . potassium chloride SA  40 mEq Oral BID   Continuous Infusions:  PRN Meds: acetaminophen **OR** acetaminophen, alum & mag hydroxide-simeth, Melatonin, ondansetron (ZOFRAN) IV   Vital Signs    Vitals:   03/05/19 2148 03/06/19 0041 03/06/19 0500 03/06/19 0640  BP: 140/65   (!) 141/95  Pulse: 97 (!) 54  96  Resp: 14 19  19   Temp:    98.2 F (36.8 C)  TempSrc:    Oral  SpO2: 97% 97%  95%  Weight:   73.6 kg   Height:        Intake/Output Summary (Last 24 hours) at 03/06/2019 0846 Last data filed at 03/06/2019 0634 Gross per 24 hour  Intake 240 ml  Output 600 ml  Net -360 ml   Last 3 Weights 03/06/2019 03/05/2019 03/04/2019  Weight (lbs) 162 lb 4.8 oz 160 lb 9.6 oz 163 lb 9.3 oz  Weight (kg) 73.619 kg 72.848 kg 74.2 kg      Telemetry    Atrial fibrilltation with elevated rate- Personally Reviewed   Physical Exam   GEN: No acute distress.  WD WN Neck: supple Cardiac: irregular and tachycardic, no murmur Respiratory: CTA GI: Soft, NT/ND MS: No edema Neuro:  Grossly intact Psych: Normal affect   Labs    High Sensitivity Troponin:   Recent Labs  Lab 03/03/19 1618 03/03/19 1826 03/03/19 2047 03/04/19 1033 03/05/19 0021  TROPONINIHS 78* 93* 93* 51* 28*      Chemistry Recent Labs  Lab 03/03/19 1125  03/04/19 0501 03/05/19 0021 03/06/19 0634  NA 138   < > 137 138 138  K 3.0*   < > 3.2* 3.8 6.6*  CL 101  --  104 107 109  CO2 22  --  23 23 19*  GLUCOSE 190*  --  137* 183* 126*  BUN 32*  --  18 20 22   CREATININE 1.07*  --   0.79 0.95 0.85  CALCIUM 8.7*  --  7.6* 7.7* 8.9  PROT 6.7  --   --   --   --   ALBUMIN 3.3*  --   --   --   --   AST 60*  --   --   --   --   ALT 40  --   --   --   --   ALKPHOS 113  --   --   --   --   BILITOT 1.2  --   --   --   --   GFRNONAA 51*  --  >60 59* >60  GFRAA 59*  --  >60 >60 >60  ANIONGAP 15  --  10 8 10    < > = values in this interval not displayed.     Hematology Recent Labs  Lab 03/03/19 1125 03/03/19 1138 03/04/19 0501 03/05/19 0021  WBC 12.1*  --  10.7* 8.1  RBC 5.06  --  4.54 4.15  HGB 16.2* 16.7* 14.4 13.2  HCT 48.7* 49.0* 43.7 40.4  MCV 96.2  --  96.3 97.3  MCH 32.0  --  31.7 31.8  MCHC 33.3  --  33.0 32.7  RDW 14.4  --  14.3 14.4  PLT 237  --  177 143*    BNP Recent Labs  Lab 03/03/19 1125  BNP 341.9*      Radiology    Ct Knee Left Wo Contrast  Result Date: 03/04/2019 CLINICAL DATA:  The patient suffered a fall and blow to the left knee do a syncopal episode 03/03/2019. Possible fracture on plain films. Initial encounter. EXAM: CT OF THE LEFT KNEE WITHOUT CONTRAST TECHNIQUE: Multidetector CT imaging of the left knee was performed according to the standard protocol. Multiplanar CT image reconstructions were also generated. COMPARISON:  Plain films left knee 03/03/2019. 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 Possible tiny chip fracture adjacent to the lateral aspect of the femoral component is not visible on this exam. No fracture is identified. Hardware is intact without evidence of loosening or other complicating feature. Ligaments Suboptimally assessed by CT. Muscles and Tendons Intact. Soft tissues Small joint effusion noted.  No lipohemarthrosis IMPRESSION: No acute abnormality. Possible tiny chip fracture off the lateral femur is not visible on this examination but could be obscured by streak artifact. However, there is no lipohemarthrosis as typically seen and knee  fractures. Small joint effusion. Electronically Signed   By: Inge Rise M.D.   On: 03/04/2019 16:51    Cardiac Studies   Echo 7/20  1. The left ventricle has normal systolic function with an ejection fraction of 60-65%. The cavity size was normal. There is mildly increased left ventricular wall thickness. Left ventricular diastolic Doppler parameters are consistent with  pseudonormalization.  2. The right ventricle has normal systolic function. The cavity was normal. There is no increase in right ventricular wall thickness.  3. Left atrial size was mildly dilated.  4. No evidence of mitral valve stenosis.  5. Aortic valve regurgitation is mild by color flow Doppler. No stenosis of the aortic valve.  6. The interatrial septum was not assessed.  Patient Profile     [name] a 73 y.o.femalewith a hx of HTN, DM2, HLD, who was admitted for syncope in the setting of persistent n/vwho is being seenfor the evaluation of new AF.  Assessment & Plan    1 newly diagnosed atrial fibrillation-patient remains in atrial fibrillation this morning.  Duration is unknown.  Echocardiogram shows normal LV function.  TSH is normal. CHADSvasc 4.  Continue apixaban.  Heart rate remains elevated.  Increase Cardizem to 360 mg daily.  Increase Lopressor to 25 mg twice daily.  She is asymptomatic.  If we can control heart rate she can be discharged and potentially proceed with cardioversion in approximately 4 weeks.  If we cannot control heart rate then we will arrange TEE guided cardioversion while she is here.  2 hypertension-continue Cardizem and metoprolol.  Follow blood pressure and adjust regimen as needed.  3 syncope-symptoms are consistent with orthostasis.  LV function normal on echocardiogram.  Would not pursue further evaluation unless recurrent episodes in the future.  4 minimally elevated troponin-no clear trend and no history of exertional chest pain.  This is felt to be demand ischemia in  the setting of atrial fibrillation.  No plans for further evaluation.  5 elevated potassium-6.6 this morning.  However slight hemolysis.  Recheck potassium.  For questions or updates,  please contact [place] Please consult [email or web address] for contact info under        Signed, Kirk Ruths, MD  03/06/2019, 8:46 AM

## 2019-03-06 NOTE — Care Management Important Message (Signed)
Important Message  Patient Details  Name: [name] MRN: [id number] Date of Birth: 1945-11-19   Medicare Important Message Given:  Yes     [name] 03/06/2019, 2:22 PM

## 2019-03-06 NOTE — Progress Notes (Signed)
[place] for apixaban Indication: atrial fibrillation  Allergies  Allergen Reactions  . Magnesium-Containing Compounds Nausea Only and Other (See Comments)    Re: Mag-Ox at higher doses caused a lot of stomach upset (dose had to be decreased)  . Monopril [Fosinopril] Cough  . Norvasc [Amlodipine Besylate] Other (See Comments)    10 mg dose caused LE edema- can tolerate 5 mg dose    Patient Measurements: Height: 4\' 10"  (147.3 cm) Weight: 162 lb 4.8 oz (73.6 kg) IBW/kg (Calculated) : 40.9  Vital Signs: Temp: 98.2 F (36.8 C) (07/14 0640) Temp Source: Oral (07/14 0640) BP: 141/95 (07/14 0640) Pulse Rate: 96 (07/14 0640)  Labs: Recent Labs    03/03/19 1125  03/03/19 2047 03/04/19 0501 03/04/19 1033 03/05/19 0021 03/06/19 0634  HGB 16.2*   < >  --  14.4  --  13.2 14.2  HCT 48.7*   < >  --  43.7  --  40.4 43.6  PLT 237  --   --  177  --  143* 174  LABPROT 14.6  --   --   --   --   --   --   INR 1.2  --   --   --   --   --   --   HEPARINUNFRC  --   --   --  0.54  --   --   --   CREATININE 1.07*  --   --  0.79  --  0.95 0.85  TROPONINIHS 64*   < > 93*  --  51* 28*  --    < > = values in this interval not displayed.    Estimated Creatinine Clearance: 49.5 mL/min (by C-G formula based on SCr of 0.85 mg/dL).   Assessment: 73 yo female here with syncopal episode. She is noted with new onset afib (CHADSVASC=4). Pharmacy consulted to start apixaban. Patient's CBC stable, and no bleeding noted.   Goal of Therapy:  Monitor platelets by anticoagulation protocol: Yes   Plan:  - Continue apixaban 5mg  PO twice daily - Monitor CBC, and for s/sx of bleeding - Pharmacy signing off, will monitor peripherally   Thank you for allowing pharmacy to be a part of this patient's care.  Agnes Lawrence, PharmD PGY1 Pharmacy Resident

## 2019-03-06 NOTE — Progress Notes (Signed)
High potassium labs both hemolyzed this morning, another potassium ordered  -Dr. Criss Rosales

## 2019-03-06 NOTE — Telephone Encounter (Signed)
Received a fax from [place] regarding a prior authorization for Methocarbamol. Authorization has been APPROVED unitl 03/03/2019.   Will send document to scan center.

## 2019-03-06 NOTE — Progress Notes (Signed)
PT Cancellation Note  Patient Details Name: [name] MRN: [id number] DOB: [birth year]   Cancelled Treatment:     Therapy attempted to see patient for therapy. Per nursing the patients HR has been jumping into the 160's with just minimal activity. Hold at this time.    Carney Living PT DPT  03/06/2019, 3:03 PM

## 2019-03-06 NOTE — Progress Notes (Addendum)
FMTS Attending Daily Note:  Katherine Netters MD Personal pager:  [phone] FPTS Service Pager:  [phone]  I have seen and examined this patient and have reviewed their chart. I have discussed this patient with the resident. I agree with the resident's findings, assessment and care plan.  Additionally:  Intermittently back in RVR - appreciate cardiology recommendations  Leeanne Rio, MD 03/06/2019   Family Medicine Teaching Service Daily Progress Note Intern Pager: [phone]  Patient name: [name] Medical record number: [id number] Date of birth: 1946-04-30 Age: 73 y.o. Gender: female  Primary Care Provider: Delsa Grana, PA-C Consultants: cards Code Status: full  Pt Overview and Major Events to Date:  [name] is a 73 y.o. female presenting with syncopal episode. PMH is significant for T2DM, HTN, HLD, and RA.  Assessment and Plan:   New onset A fib with RVR-  HR 54-130 overnight, still Afib, pt had pleuritic chest pain yesterday, reviewed by attending-thought to be musculoskeletal as pain reproducible on palpation. Now asymptomatic  Echo-ejection fraction of 60-65%. The cavity size was normal. There is mildly increased left ventricular wall thickness. Left ventricular diastolic Doppler parameters are consistent with  Pseudonormalization. Serial troponins as trending downwards-28 today, 51 on 7/12, 93 on 7/11,93 on 7/11 Anticoagulation: Apixaban 5mg  twice a day. CHADSvasc 4 Rate control-Diltiazem now 360mg  (increased from 240mg  7/13) once daily and metoprolol changed to 25mg  BID from 12.5mg  BID -Dopplers lower limb -Continue to consult cards, appreciate recs -Consider stress test as an outpatient -Cardioversion in 4 weeks if HR controlled, if HR not controlled-TEE guided cardioversion as inpatient  Syncope Likely secondary to new onset Afib and GI losses (diarrhea and vomiting prior to admission) causing hypovolemia  Asymptomatic, symptoms are consistent with  orthostasis. LV function normal on echocardiogram.   Does not require further evaluation-as per cardio  HTN- home meds: amlodipine 5mg , clonidine 0.3mg  BID, hyzaar 100-12.5, metoprolol 12.5mg  BID BP 140/65 today  -stopped clonidine 7/13 - holding amlodipine, losartan-HCTZ in setting of hypovolaemia-consider adding on when BP tolerates - cointinue metoprolol 12.5mg  BID  Hypokalamia-resolved K+ 4.7 today -Continue to monitor with BMP   Hypomagnesemia-resolved -continue to monitor and replete if necessary   Knee fracture  Xray left knee on 7/13: There is a cortical defect in the distal femur adjacent to the lateral aspect of the femoral component of the knee replacement.This is an age-indeterminate finding. A subtle acute fracture in this region is not excluded. No other evidence of fracture. Consulted by ortho on 7/12-advised CT knee without contrast which showed: No acute abnormality. Possible tiny chip fracture off the lateral femur is not visible on this examination but could be obscured by streak artifact. However, there is no lipohemarthrosis as typically seen and knee fractures. Small joint effusion. -Continue with knee immoblizer-may weight bear as tolerated, as per ortho-many thanks for recs -Monitor pain and swelling -analgesia PRN for pain  -Physical therapy  Gastroenteritis (likely viral) Pt not currently vomiting/having diarrhea - Continue orthostatic vitals - Ondansetron prn - IV fluids  -continue to monitor CBC and BMP   Rheumatoid Arthritis: Home meds - Enbrel, Arava, Robaxin (PRN for muscle spasms), Per previous rheum note: +RF, +CCP, -ANA. Treated by Dr. Estanislado Pandy. - Continue home medications - tylenol PRN - hold home robaxin  T2DM- Hgb A1c 6.4 this admission-well controlled and less likely to be contributing factor to dulled ACS sensation. Home meds: metformin 500mg  BID Last glucose 162  - holding home metformin for now (500mg  BID) - CBGs daily  FEN/GI:  normal diet as per pts request, PPx: apixaban   Disposition: pending improvement of syncope and Afib   Subjective:  Pt feeling better compared to yesterday. Denies dyspea, nausea or vomiting. Still has mild, burning chest pain related GERD and she says she feels it in her neck. I said I will add on another medication for this. Eating better than yesterday. No new concerns.   Objective: Pulse Rate:  [54-130] 54 (07/14 0041) Resp:  [14-21] 19 (07/14 0041) BP: (99-146)/(65-93) 140/65 (07/13 2148) SpO2:  [91 %-97 %] 97 % (07/14 0041) Weight:  [72.8 kg] 72.8 kg (07/13 0620)   General: Alert and cooperative and appears to be in no acute distress HEENT: Neck non-tender without lymphadenopathy, masses or thyromegaly Cardio: Normal S1 and S2, no S3 or S4. Rhythm is regular. No murmurs or rubs.   Pulm: Clear to auscultation bilaterally, no crackles, wheezing, or diminished breath sounds. Normal respiratory effort Abdomen: Bowel sounds normal. Abdomen soft and non-tender.  Extremities: No peripheral edema. Warm/ well perfused.  Strong radial pulse Neuro: Cranial nerves grossly intact  Laboratory: Recent Labs  Lab 03/03/19 1125 03/03/19 1138 03/04/19 0501 03/05/19 0021  WBC 12.1*  --  10.7* 8.1  HGB 16.2* 16.7* 14.4 13.2  HCT 48.7* 49.0* 43.7 40.4  PLT 237  --  177 143*   Recent Labs  Lab 03/03/19 1125 03/03/19 1138 03/04/19 0501 03/05/19 0021  NA 138 137 137 138  K 3.0* 3.0* 3.2* 3.8  CL 101  --  104 107  CO2 22  --  23 23  BUN 32*  --  18 20  CREATININE 1.07*  --  0.79 0.95  CALCIUM 8.7*  --  7.6* 7.7*  PROT 6.7  --   --   --   BILITOT 1.2  --   --   --   ALKPHOS 113  --   --   --   ALT 40  --   --   --   AST 60*  --   --   --   GLUCOSE 190*  --  137* 183*    Imaging/Diagnostic Tests: Ct Knee Left Wo Contrast  Result Date: 03/04/2019 CLINICAL DATA:  The patient suffered a fall and blow to the left knee do a syncopal episode 03/03/2019. Possible fracture on plain  films. Initial encounter. EXAM: CT OF THE LEFT KNEE WITHOUT CONTRAST TECHNIQUE: Multidetector CT imaging of the left knee was performed according to the standard protocol. Multiplanar CT image reconstructions were also generated. COMPARISON:  Plain films left knee 03/03/2019. 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 Possible tiny chip fracture adjacent to the lateral aspect of the femoral component is not visible on this exam. No fracture is identified. Hardware is intact without evidence of loosening or other complicating feature. Ligaments Suboptimally assessed by CT. Muscles and Tendons Intact. Soft tissues Small joint effusion noted.  No lipohemarthrosis IMPRESSION: No acute abnormality. Possible tiny chip fracture off the lateral femur is not visible on this examination but could be obscured by streak artifact. However, there is no lipohemarthrosis as typically seen and knee fractures. Small joint effusion. Electronically Signed   By: Inge Rise M.D.   On: 03/04/2019 16:51    Lattie Haw, MD 03/06/2019, 5:32 AM PGY-1, [place] Intern pager: [phone], text pages welcome

## 2019-03-07 ENCOUNTER — Telehealth: Payer: Self-pay | Admitting: Cardiovascular Disease

## 2019-03-07 LAB — BASIC METABOLIC PANEL WITH GFR
Anion gap: 9 (ref 5–15)
BUN: 17 mg/dL (ref 8–23)
CO2: 22 mmol/L (ref 22–32)
Calcium: 9.4 mg/dL (ref 8.9–10.3)
Chloride: 108 mmol/L (ref 98–111)
Creatinine, Ser: 0.87 mg/dL (ref 0.44–1.00)
GFR calc Af Amer: >60 mL/min
GFR calc non Af Amer: >60 mL/min
Glucose, Bld: 111 mg/dL — ABNORMAL HIGH (ref 70–99)
Potassium: 4.5 mmol/L (ref 3.5–5.1)
Sodium: 139 mmol/L (ref 135–145)

## 2019-03-07 LAB — CBC
HCT: 42.4 % (ref 36.0–46.0)
Hemoglobin: 13.4 g/dL (ref 12.0–15.0)
MCH: 31.5 pg (ref 26.0–34.0)
MCHC: 31.6 g/dL (ref 30.0–36.0)
MCV: 99.8 fL (ref 80.0–100.0)
Platelets: 178 10*3/uL (ref 150–400)
RBC: 4.25 MIL/uL (ref 3.87–5.11)
RDW: 14.5 % (ref 11.5–15.5)
WBC: 9.9 10*3/uL (ref 4.0–10.5)
nRBC: 0 % (ref 0.0–0.2)

## 2019-03-07 LAB — GLUCOSE, CAPILLARY
Glucose-Capillary: 124 mg/dL — ABNORMAL HIGH (ref 70–99)
Glucose-Capillary: 221 mg/dL — ABNORMAL HIGH (ref 70–99)
Glucose-Capillary: 169 mg/dL — ABNORMAL HIGH (ref 70–99)

## 2019-03-07 MED ORDER — APIXABAN 5 MG PO TABS: 5.0000 mg | ORAL_TABLET | Freq: Two times a day (BID) | ORAL | 1 refills | Status: DC

## 2019-03-07 MED ORDER — METOPROLOL TARTRATE 50 MG PO TABS: 50.0000 mg | ORAL_TABLET | Freq: Two times a day (BID) | ORAL | 0 refills | Status: DC

## 2019-03-07 MED ORDER — FAMOTIDINE 20 MG PO TABS: 20.0000 mg | ORAL_TABLET | Freq: Every day | ORAL | 0 refills | Status: DC

## 2019-03-07 MED ORDER — METOPROLOL TARTRATE 50 MG PO TABS
50.0000 mg | ORAL_TABLET | Freq: Two times a day (BID) | ORAL | Status: DC
  Administered 2019-03-07: 50 mg via ORAL
  Filled 2019-03-07: qty 1

## 2019-03-07 MED ORDER — DILTIAZEM HCL ER COATED BEADS 360 MG PO CP24: 360.0000 mg | ORAL_CAPSULE | Freq: Every day | ORAL | 0 refills | Status: DC

## 2019-03-07 MED FILL — ELIQUIS 5 MG TABLET: 5 | 30 days supply | Qty: 60 | Fill #0

## 2019-03-07 NOTE — Progress Notes (Addendum)
Family Medicine Teaching Service Daily Progress Note Intern Pager: [phone]  Patient name: [name] Medical record number: [id number] Date of birth: 01-30-1946 Age: 73 y.o. Gender: female  Primary Care Provider: Delsa Grana, PA-C Consultants: cards Code Status: full  Pt Overview and Major Events to Date:  [name] is a 73 y.o. female presenting with syncopal episode. PMH is significant for T2DM, HTN, HLD, and RA.  Assessment and Plan:  New onset A fib with RVR-  HR 88-113 overnight, still Afib with RVR Echo-ejection fraction of 60-65%. The cavity size was normal. There is mildly increased left ventricular wall thickness. Left ventricular diastolic Doppler parameters are consistent with pseudonormalization. Serial troponins as trending downwards-28 today, 51 on 7/12, 93 on 7/11,93 on 7/11 Anticoagulation: Apixaban 5mg  twice a day. CHADSvasc 4 Rate control-Diltiazem now 360mg  (increased from 240mg  7/13) once daily and metoprolol changed to 50mg  BID from 25mg  BID.  -Cardiology signed off this morning, appreciate recs--Cardioversion as an outpatient in 4 weeks if HR controlled, if HR not controlled-TEE guided cardioversion as inpatient. [TOC appt with APP one week; FU Dr [name] 8 weeks] -Consider stress test as an outpatient  Syncope Likely secondary to new onset Afib and GI losses (diarrhea and vomiting prior to admission) causing hypovolemia  Currently asymptomatic Does not require further evaluation-as per cardio  HTN- home meds: amlodipine 5mg , clonidine 0.3mg  BID, hydrazaline 100-12.5, metoprolol 25mg  BID BP 168/142 today  -stopped clonidine 7/13 - holding amlodipine, losartan-HCTZ in setting of hypovolaemia-consider adding on when BP tolerates - cointinue metoprolol 25mg  BID  Hypokalamia-resolved K+ 4.5 today -Continue to monitor with BMP   Hypomagnesemia-resolved -continue to monitor and replete if necessary   Knee fracture  Xray left knee on 7/13: There is a  cortical defect in the distal femur adjacent to the lateral aspect of the femoral component of the knee replacement.This is an age-indeterminate finding. A subtle acute fracture in this region is not excluded. No other evidence of fracture. Consulted by ortho on 7/12-advised CT knee without contrast which showed: No acute abnormality. Possible tiny chip fracture off the lateral femur is not visible on this examination but could be obscured by streak artifact. However, there is no lipohemarthrosis as typically seen and knee fractures. Small joint effusion. -Continue with knee immoblizer-may weight bear as tolerated, as per ortho-many thanks for recs -Monitor pain and swelling -analgesia PRN for pain  -Physical therapy  Gastroenteritis (likely viral)-resolved Asymptomatic  - Continue orthostatic vitals - Ondansetron prn - IV fluids  -continue to monitor CBC and BMP   Rheumatoid Arthritis: Home meds - Enbrel, Arava, Robaxin (PRN for muscle spasms), Per previous rheum note: +RF, +CCP, -ANA. Treated by Dr. Estanislado Pandy. - Continue home medications - tylenol PRN - hold home robaxin  T2DM- Hgb A1c 6.4 this admission-well controlled and less likely to be contributing factor to dulled ACS sensation. Home meds: metformin 500mg  BID Last glucose 162  - holding home metformin for now (500mg  BID) - CBGs daily  FEN/GI: normal diet as per pts request PPx: Apixaban   Disposition: pending improvement of syncope and Afib   Subjective:  Pt doing well. Feels well, got up 8 times to pass urine last night. Denies dysuria. No dyspnea, vomiting. Mild burning pain in chest still present. I advised we have started another ant-acid medication. Eating and drinking well and also mobilising with brace. Pt has no new concerns.   Objective: Temp:  [98.2 F (36.8 C)-98.6 F (37 C)] 98.6 F (37 C) (07/14 2148) Pulse Rate:  [  88-113] 98 (07/15 0544) Resp:  [14-19] 14 (07/15 0544) BP: (124-176)/(64-142) 168/142  (07/15 0544) SpO2:  [95 %-96 %] 95 % (07/15 0544) Weight:  [74 kg] 74 kg (07/15 0225)   General: Alert and cooperative and appears to be in no acute distress HEENT: Neck non-tender without lymphadenopathy, masses or thyromegaly Cardio: Normal S1 and S2 plus Afib. Rhythm is irregular. No murmurs or rubs.   Pulm: Clear to auscultation bilaterally, no crackles, wheezing, or diminished breath sounds. Normal respiratory effort Abdomen: Bowel sounds normal. Abdomen soft and non-tender.  Extremities: No peripheral edema. Warm/ well perfused.  Strong radial. Calves soft non tender. Knee immobliser noted on left knee. Neuro: Cranial nerves grossly intact  Laboratory: Recent Labs  Lab 03/04/19 0501 03/05/19 0021 03/06/19 0634  WBC 10.7* 8.1 11.1*  HGB 14.4 13.2 14.2  HCT 43.7 40.4 43.6  PLT 177 143* 174   Recent Labs  Lab 03/03/19 1125  03/04/19 0501 03/05/19 0021 03/06/19 0634 03/06/19 1009 03/06/19 1529  NA 138   < > 137 138 138  --   --   K 3.0*   < > 3.2* 3.8 6.6* 5.9* 4.7  CL 101  --  104 107 109  --   --   CO2 22  --  23 23 19*  --   --   BUN 32*  --  18 20 22   --   --   CREATININE 1.07*  --  0.79 0.95 0.85  --   --   CALCIUM 8.7*  --  7.6* 7.7* 8.9  --   --   PROT 6.7  --   --   --   --   --   --   BILITOT 1.2  --   --   --   --   --   --   ALKPHOS 113  --   --   --   --   --   --   ALT 40  --   --   --   --   --   --   AST 60*  --   --   --   --   --   --   GLUCOSE 190*  --  137* 183* 126*  --   --    < > = values in this interval not displayed.    Imaging/Diagnostic Tests: No results found.  Lattie Haw, MD 03/07/2019, 5:55 AM PGY-1, [place] Intern pager: [phone], text pages welcome

## 2019-03-07 NOTE — Progress Notes (Signed)
Progress Note  Patient Name: [name] Date of Encounter: 03/07/2019  Primary Cardiologist: Dr [name]  Subjective   No CP or dyspnea  Inpatient Medications    Scheduled Meds: . apixaban  5 mg Oral BID  . atorvastatin  10 mg Oral Daily  . diltiazem  360 mg Oral Daily  . famotidine  20 mg Oral Daily  . insulin aspart  0-5 Units Subcutaneous QHS  . insulin aspart  0-9 Units Subcutaneous TID WC  . leflunomide  10 mg Oral Daily  . levothyroxine  25 mcg Oral Q0600  . metoprolol tartrate  25 mg Oral BID  . pantoprazole  40 mg Oral Daily   Continuous Infusions:  PRN Meds: acetaminophen **OR** acetaminophen, alum & mag hydroxide-simeth, Melatonin, ondansetron (ZOFRAN) IV   Vital Signs    Vitals:   03/07/19 0225 03/07/19 0535 03/07/19 0544 03/07/19 0605  BP:  (!) 176/101 (!) 168/142 (!) 158/84  Pulse:  88 98   Resp:  17 14   Temp:      TempSrc:  Oral    SpO2:  96% 95%   Weight: 74 kg     Height:        Intake/Output Summary (Last 24 hours) at 03/07/2019 0748 Last data filed at 03/07/2019 0707 Gross per 24 hour  Intake 980 ml  Output 2400 ml  Net -1420 ml   Last 3 Weights 03/07/2019 03/06/2019 03/05/2019  Weight (lbs) 163 lb 2.3 oz 162 lb 4.8 oz 160 lb 9.6 oz  Weight (kg) 74 kg 73.619 kg 72.848 kg      Telemetry    Atrial fibrilltation rate improved- Personally Reviewed   Physical Exam   GEN: NAD  WD WN Neck: No JVD Cardiac: irregular Respiratory: CTA; no wheeze GI: Soft, NT/ND, no masses MS: No edema Neuro:  No focal findings Psych: Normal affect   Labs    High Sensitivity Troponin:   Recent Labs  Lab 03/03/19 1618 03/03/19 1826 03/03/19 2047 03/04/19 1033 03/05/19 0021  TROPONINIHS 78* 93* 93* 51* 28*      Chemistry Recent Labs  Lab 03/03/19 1125  03/05/19 0021 03/06/19 0634 03/06/19 1009 03/06/19 1529 03/07/19 0542  NA 138   < > 138 138  --   --  139  K 3.0*   < > 3.8 6.6* 5.9* 4.7 4.5  CL 101   < > 107 109  --   --  108  CO2 22    < > 23 19*  --   --  22  GLUCOSE 190*   < > 183* 126*  --   --  111*  BUN 32*   < > 20 22  --   --  17  CREATININE 1.07*   < > 0.95 0.85  --   --  0.87  CALCIUM 8.7*   < > 7.7* 8.9  --   --  9.4  PROT 6.7  --   --   --   --   --   --   ALBUMIN 3.3*  --   --   --   --   --   --   AST 60*  --   --   --   --   --   --   ALT 40  --   --   --   --   --   --   ALKPHOS 113  --   --   --   --   --   --  BILITOT 1.2  --   --   --   --   --   --   GFRNONAA 51*   < > 59* >60  --   --  >60  GFRAA 59*   < > >60 >60  --   --  >60  ANIONGAP 15   < > 8 10  --   --  9   < > = values in this interval not displayed.     Hematology Recent Labs  Lab 03/05/19 0021 03/06/19 0634 03/07/19 0542  WBC 8.1 11.1* 9.9  RBC 4.15 4.43 4.25  HGB 13.2 14.2 13.4  HCT 40.4 43.6 42.4  MCV 97.3 98.4 99.8  MCH 31.8 32.1 31.5  MCHC 32.7 32.6 31.6  RDW 14.4 14.8 14.5  PLT 143* 174 178    BNP Recent Labs  Lab 03/03/19 1125  BNP 341.9*     Cardiac Studies   Echo 7/20  1. The left ventricle has normal systolic function with an ejection fraction of 60-65%. The cavity size was normal. There is mildly increased left ventricular wall thickness. Left ventricular diastolic Doppler parameters are consistent with  pseudonormalization.  2. The right ventricle has normal systolic function. The cavity was normal. There is no increase in right ventricular wall thickness.  3. Left atrial size was mildly dilated.  4. No evidence of mitral valve stenosis.  5. Aortic valve regurgitation is mild by color flow Doppler. No stenosis of the aortic valve.  6. The interatrial septum was not assessed.  Patient Profile     [name] a 73 y.o.femalewith a hx of HTN, DM2, HLD, who was admitted for syncope in the setting of persistent n/vwho is being seenfor the evaluation of new AF.  Assessment & Plan    1 newly diagnosed atrial fibrillation-patient remains in atrial fibrillation this morning.  Duration is unknown.   Echocardiogram shows normal LV function.  TSH is normal. CHADSvasc 4.  Continue apixaban.  Heart rate remains mildly elevated but improved.  Continue Cardizem 360 mg daily.  Increase Lopressor to 50 mg twice daily.  She remains asymptomatic. Pt can be DCed later today from a cardiac standpoint. Will plan DCCV 4 weeks after uninterrupted anticoagulation.  2 hypertension-continue Cardizem and metoprolol.  Follow blood pressure and adjust regimen as needed.  3 syncope-symptoms consistent with orthostasis.  LV function normal on echocardiogram.  Would not pursue further evaluation unless recurrent episodes in the future.  4 minimally elevated troponin-no clear trend and no history of exertional chest pain.  This is felt to be demand ischemia in the setting of atrial fibrillation.  No plans for further evaluation.  5 elevated potassium-resolved  [facility] will sign off.   Medication Recommendations:  meds as listed in [facility] Other recommendations (labs, testing, etc):  No other recommendations Follow up as an outpatient:  TOC appt with APP one week; FU Dr [name] 8 weeks.  For questions or updates, please contact [place] Please consult [email or web address] for contact info under        Signed, Kirk Ruths, MD  03/07/2019, 7:48 AM

## 2019-03-07 NOTE — TOC Transition Note (Signed)
Transition of Care [facility]) - CM/SW Discharge Note   Patient Details  Name: [name] MRN: [id number] Date of Birth: 03-04-1946  Transition of Care [facility]) CM/SW Contact:  Bethena Roys, RN Phone Number: 03/07/2019, 4:45 PM   Clinical Narrative:   Pt presented for Syncope- PT recommendations for [facility] PT- case manager did offer choice and patient refused [facility] Services. CM did make patient aware to contact PCP if she has needs in the future. Eliquis was delivered via [facility].  No further needs from CM at this time.    Final next level of care: Home/Self Care Barriers to Discharge: No Barriers Identified   Patient Goals and CMS Choice Patient states their goals for this hospitalization and ongoing recovery are:: "to reutrn home with family"   Choice offered to / list presented to : NA  Discharge Placement                       Discharge Plan and Services In-house Referral: NA Discharge Planning Services: CM Consult, Medication Assistance Post Acute Care Choice: NA                    HH Arranged: Refused HH          Social Determinants of Health (SDOH) Interventions     Readmission Risk Interventions No flowsheet data found.

## 2019-03-07 NOTE — Telephone Encounter (Signed)
Per Pecolia Ades patient has TOC appt on 03/13/19 @ 9:45 am

## 2019-03-07 NOTE — Progress Notes (Signed)
Physical Therapy Treatment Patient Details Name: [name] MRN: [id number] DOB: 05-20-1946 Today's Date: 03/07/2019    History of Present Illness 73 y.o. female presenting with syncopal episode. PMH is significant for T2DM, HTN, HLD, and RA.  Apparently fell and injured L knee with PMH positive for remote TKA.  WBAT in knee immobilizer per chart.    PT Comments    Pt was up to walk today with RW and adjustment of brace. Talked with pt about the brace needing to be stabilized under the patella first, to anchor and she voiced understanding.  Walk and ex's went well, shared she has a walker but needs a shower chair for home.  Messaged nursing to convey to case management.  Follow acutely as needed until transition home, and focus on her safety with transfers and her control of balance with RW during maneuvering in confined spaces and on the hallway.    Follow Up Recommendations  Home health PT;Supervision - Intermittent     Equipment Recommendations  Other (comment)(shower chair per pt)    Recommendations for Other Services       Precautions / Restrictions Precautions Precautions: Fall Precaution Comments: L knee immobilizer with all gait Required Braces or Orthoses: Knee Immobilizer - Left Restrictions Weight Bearing Restrictions: Yes LLE Weight Bearing: Weight bearing as tolerated    Mobility  Bed Mobility               General bed mobility comments: up in chair when PT arrived  Transfers Overall transfer level: Needs assistance Equipment used: Rolling walker (2 wheeled) Transfers: Sit to/from Stand Sit to Stand: Min guard         General transfer comment: pt is able to manage with knee brace to stand after PT adjusted the brace to wrap over knee more completely  Ambulation/Gait Ambulation/Gait assistance: Min guard Gait Distance (Feet): 80 Feet Assistive device: Rolling walker (2 wheeled) Gait Pattern/deviations: Step-through pattern;Decreased stride  length;Trunk flexed;Wide base of support Gait velocity: controlled Gait velocity interpretation: <1.31 ft/sec, indicative of household ambulator General Gait Details: pt is shifting onto brace leg well, not a great deal of difference in wgt shift side to side   Stairs             Wheelchair Mobility    Modified Rankin (Stroke Patients Only)       Balance     Sitting balance-Leahy Scale: Good       Standing balance-Leahy Scale: Fair                              Cognition Arousal/Alertness: Awake/alert Behavior During Therapy: WFL for tasks assessed/performed Overall Cognitive Status: Within Functional Limits for tasks assessed                                        Exercises General Exercises - Lower Extremity Ankle Circles/Pumps: AROM;Both;5 reps Quad Sets: AROM;Both;10 reps Gluteal Sets: AROM;Both;10 reps Heel Slides: AROM;Right;10 reps Hip ABduction/ADduction: AROM;Both;10 reps    General Comments General comments (skin integrity, edema, etc.): pt is very positive about wanting to get home, about wanting to do therapy      Pertinent Vitals/Pain Pain Assessment: No/denies pain    Home Living                      Prior Function  PT Goals (current goals can now be found in the care plan section) Acute Rehab PT Goals Patient Stated Goal: get home and walk better Progress towards PT goals: Progressing toward goals    Frequency    Min 3X/week      PT Plan Current plan remains appropriate    Co-evaluation              AM-PAC PT "6 Clicks" Mobility   Outcome Measure  Help needed turning from your back to your side while in a flat bed without using bedrails?: None Help needed moving from lying on your back to sitting on the side of a flat bed without using bedrails?: A Little Help needed moving to and from a bed to a chair (including a wheelchair)?: A Little Help needed standing up from a  chair using your arms (e.g., wheelchair or bedside chair)?: A Little Help needed to walk in hospital room?: A Little Help needed climbing 3-5 steps with a railing? : A Lot 6 Click Score: 18    End of Session Equipment Utilized During Treatment: Gait belt;Left knee immobilizer Activity Tolerance: Patient tolerated treatment well Patient left: in chair;with call bell/phone within reach Nurse Communication: Mobility status PT Visit Diagnosis: Other abnormalities of gait and mobility (R26.89);History of falling (Z91.81)     Time: 9747-1855 PT Time Calculation (min) (ACUTE ONLY): 24 min  Charges:  $Gait Training: 8-22 mins $Therapeutic Exercise: 8-22 mins                    Ramond Dial 03/07/2019, 1:17 PM   Mee Hives, PT MS Acute Rehab Dept. Number: [place]

## 2019-03-08 ENCOUNTER — Ambulatory Visit: Payer: PPO | Admitting: Gastroenterology

## 2019-03-08 LAB — CULTURE, BLOOD (ROUTINE X 2)
Culture: NO GROWTH
Culture: NO GROWTH
Special Requests: ADEQUATE

## 2019-03-09 NOTE — Telephone Encounter (Signed)
Patient contacted regarding discharge from [place] on 03-07-2019.  Patient understands to follow up with provider lawrence on 03-13-2019 at  at [facility]. Patient understands discharge instructions? yes Patient understands medications and regiment? yes Patient understands to bring all medications to this visit? yes

## 2019-03-12 NOTE — Progress Notes (Signed)
Cardiology Office Note   Date:  03/13/2019   ID:  Liat Mayol, DOB 23-Jan-1946, MRN [id number]  PCP:  Delsa Grana, PA-C  Cardiologist:  Dr.Berry  No chief complaint on file.    History of Present Illness: [name] is a 73 y.o. female who presents for post hospital follow up after being seen on consultation for new onset Atrial fibrillation. Other history of hypertension, DM2, and HLD.Marland Kitchen  She was stared on apixaban 5 mg BID with CHADS VASC Score of 4. She was started on diltiazem 360 mg daily, lopressor 50 mg BID. She was also found to have orthostatic BP causing a syncopal episode. A NM stress test was recommended. Amlodipine clonidine, and hydralazine were discontinued.   The patient fell when she was at home from a syncopal episode and injured her left knee with tiny chip fracture. She was also found to have a small joint effusion. She is wearing a knee immobilizer for stability.   She comes today feeling much better. She denies racing HR or irregular rhythm. She is not having any bleeding or bruising from the Eliquis. She denies fatigue. Echo during hospitalization did not reveal reduced EF or left atrial enlargement.   Past Medical History:  Diagnosis Date  . Arthritis    osteoarthritis  . Bursitis of right shoulder   . Chronic cholecystitis with calculus 05/04/2013  . 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 Diabetes type 2, controlled ([place]) 05/04/2013  . Fatty liver disease, nonalcoholic 2202   hospitalized, MRCP dx fatty liver  . GERD (gastroesophageal reflux disease)   . History of hypertension 08/20/2016  . Hormone replacement therapy (postmenopausal)   . Hormone replacement therapy (postmenopausal)   . Hyperlipidemia   . Hypertension   . Hypertensive urgency 07/11/2018  . Hypokalemia 07/11/2018  . Malnutrition of moderate degree ([place]) 03/18/2014  . Nausea & vomiting 07/11/2018  . Normal cardiac stress test    pt can't remember when or  where  . Obesity, unspecified 12/27/2012  . Rheumatoid arthritis(714.0) 09/28/2012  . Sepsis ([place]) 03/16/2014  . SIRS (systemic inflammatory response syndrome) ([place]) 07/11/2018  . 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 Unspecified essential hypertension 05/04/2013    Past Surgical History:  Procedure Laterality Date  . ABDOMINAL HYSTERECTOMY    . CHOLECYSTECTOMY N/A 05/04/2013   Procedure: LAPAROSCOPIC CHOLECYSTECTOMY WITH INTRAOPERATIVE CHOLANGIOGRAM;  Surgeon: Haywood Lasso, MD;  Location: [facility];  Service: General;  Laterality: N/A;  . ERCP N/A 05/02/2013   Procedure: ENDOSCOPIC RETROGRADE CHOLANGIOPANCREATOGRAPHY (ERCP);  Surgeon: Beryle Beams, MD;  Location: [facility];  Service: Endoscopy;  Laterality: N/A;  . JOINT REPLACEMENT     left knee  . NASAL SINUS SURGERY    . SPHINCTEROTOMY  05/02/2013   Procedure: SPHINCTEROTOMY;  Surgeon: Beryle Beams, MD;  Location: [facility];  Service: Endoscopy;;  . TONSILLECTOMY    . TOTAL KNEE ARTHROPLASTY  08/01/2012   Procedure: TOTAL KNEE ARTHROPLASTY;  Surgeon: Meredith Pel, MD;  Location: [facility];  Service: Orthopedics;  Laterality: Right;  Right total knee arthroplasty  . TOTAL KNEE ARTHROPLASTY  08/01/2012   RIGHT  KNEE     Current Outpatient Medications  Medication Sig Dispense Refill  . apixaban (ELIQUIS) 5 MG TABS tablet Take 1 tablet (5 mg total) by mouth 2 (two) times daily. 60 tablet 1  . atorvastatin (LIPITOR) 10 MG tablet TAKE 1 TABLET BY MOUTH EVERY DAY (Patient taking differently: Take 10  mg by mouth daily. ) 90 tablet 3  . Calcium Carbonate-Vitamin D (CALCIUM 600 + D PO) Take 1 tablet by mouth 2 (two) times daily.    . cyanocobalamin 500 MCG tablet Take 1 tablet (500 mcg total) by mouth daily. 30 tablet 0  . diclofenac sodium (VOLTAREN) 1 % GEL Apply 3 g topically daily as needed (to painful sites (in 3 large joints)).     Marland Kitchen diltiazem (CARDIZEM CD) 360 MG 24 hr capsule Take 1 capsule (360 mg total) by  mouth daily. 30 capsule 0  . estradiol (ESTRACE) 0.5 MG tablet Take 0.5 mg by mouth 2 (two) times a day.    . etanercept (ENBREL SURECLICK) 50 MG/ML injection INJECT 50 MG (0.98 ML) UNDER THE SKIN ONCE A WEEK (Patient taking differently: Inject 50 mg into the skin every Saturday at 6 PM. ) 12 Syringe 0  . famotidine (PEPCID) 20 MG tablet Take 1 tablet (20 mg total) by mouth daily. 30 tablet 0  . Ferrous Sulfate (IRON) 325 (65 Fe) MG TABS Take 325 mg by mouth daily.     . fish oil-omega-3 fatty acids 1000 MG capsule Take 1 g by mouth at bedtime.     . folic acid (FOLVITE) 1 MG tablet Take 1 tablet (1 mg total) by mouth 2 (two) times daily. 60 tablet 3  . leflunomide (ARAVA) 10 MG tablet TAKE 1 TABLET BY MOUTH EVERY DAY (Patient taking differently: Take 10 mg by mouth daily. ) 90 tablet 0  . levothyroxine (SYNTHROID) 25 MCG tablet Take 1 tab PO 1 hour before breakfast x 2 weeks, then take 2 tabs PO before breakfast daily (Patient taking differently: Take 25-50 mcg by mouth See admin instructions. Take 25 mcg by mouth one hour before breakfast for 2 weeks, then increase to 50 mcg daily) 90 tablet 0  . metFORMIN (GLUCOPHAGE) 500 MG tablet TAKE 1 TABLET (500 MG TOTAL) BY MOUTH 2 (TWO) TIMES DAILY WITH A MEAL. 180 tablet 1  . methocarbamol (ROBAXIN) 500 MG tablet Take 1 tablet (500 mg total) by mouth daily as needed for muscle spasms. 30 tablet 0  . metoprolol tartrate (LOPRESSOR) 50 MG tablet Take 1 tablet (50 mg total) by mouth 2 (two) times daily. 60 tablet 0  . Multiple Vitamins-Minerals (MULTIVITAMINS THER. W/MINERALS) TABS Take 1 tablet by mouth daily.     No current facility-administered medications for this visit.     Allergies:   Magnesium-containing compounds, Monopril [fosinopril], and Norvasc [amlodipine besylate]    Social History:  The patient  reports that she has quit smoking. Her smoking use included cigarettes. She has a 0.50 pack-year smoking history. She has never used smokeless  tobacco. She reports current alcohol use. She reports that she does not use drugs.   Family History:  The patient's family history includes Cancer in her mother; Diabetes in her daughter and sister; Heart attack in her mother; Stroke in her father.    ROS: All other systems are reviewed and negative. Unless otherwise mentioned in H&P    PHYSICAL EXAM: VS:  BP (!) 167/98   Pulse 71   Ht 4\' 10"  (1.473 m)   Wt 164 lb (74.4 kg)   BMI 34.28 kg/m  , BMI Body mass index is 34.28 kg/m. GEN: Well nourished, well developed, in no acute distress HEENT: normal Neck: no JVD, carotid bruits, or masses Cardiac: RRR; no murmurs, rubs, or gallops,no edema  Respiratory:  Clear to auscultation bilaterally, normal work of breathing GI:  soft, nontender, nondistended, + BS MS: no deformity or atrophy. She is wearing a knee brace to her left knee.  Skin: warm and dry, no rash Neuro:  Strength and sensation are intact Psych: euthymic mood, full affect   EKG:  NSR rate of 71 bpm. Anterior abnormalities which are non-specific.   Recent Labs: 03/03/2019: ALT 40; B Natriuretic Peptide 341.9; TSH 3.788 03/05/2019: Magnesium 1.7 03/07/2019: BUN 17; Creatinine, Ser 0.87; Hemoglobin 13.4; Platelets 178; Potassium 4.5; Sodium 139    Lipid Panel    Component Value Date/Time   CHOL 155 01/30/2019 1014   TRIG 173 (H) 01/30/2019 1014   HDL 59 01/30/2019 1014   CHOLHDL 2.6 01/30/2019 1014   VLDL 31 (H) 01/19/2017 0825   LDLCALC 71 01/30/2019 1014      Wt Readings from Last 3 Encounters:  03/13/19 164 lb (74.4 kg)  03/07/19 163 lb 2.3 oz (74 kg)  02/28/19 157 lb 9.6 oz (71.5 kg)      Other studies Reviewed:  Echo 7/20 1. The left ventricle has normal systolic function with an ejection fraction of 60-65%. The cavity size was normal. There is mildly increased left ventricular wall thickness. Left ventricular diastolic Doppler parameters are consistent with  pseudonormalization. 2. The right  ventricle has normal systolic function. The cavity was normal. There is no increase in right ventricular wall thickness. 3. Left atrial size was mildly dilated. 4. No evidence of mitral valve stenosis. 5. Aortic valve regurgitation is mild by color flow Doppler. No stenosis of the aortic valve. 6. The interatrial septum was not assessed.   ASSESSMENT AND PLAN:  1. Atrial fib with RVR: Completely resolved on lopressor and diltiazem. She is now on Eliquis 5 mg BID. No bleeding issues.  EKG reveals NSR at this time. Will plan Leane Call for evidence of ischemia which may have caused episode as recommended by cardiology during recent admission. (CVRF, DM, HTN, FH, Thyroid disease, HL),  I explained the procedure to her and she verbalizes understanding. She is willing to proceed.   2. Syncope: No further episodes of syncope since admission. Will continue to monitor. If stress test is normal, may consider cardiac monitor if symptoms reoccur.   3.OSA: Has not been officially diagnosed. Can consider sleep study once stress test is completed.  4. Hypertension: Elevated today. I rechecked it in the exam room. 132/78.  Will not make any changes in her regimen.   5. Left knee chip fracture; Wearing knee immobilizer and will follow with orthopedics.  Current medicines are reviewed at length with the patient today.    Labs/ tests ordered today include: Lexiscan Myoview   Phill Myron. West Pugh, ANP, [facility]   03/13/2019 11:24 AM    [facility] [address] Office [phone] Fax [phone]

## 2019-03-13 ENCOUNTER — Encounter: Payer: Self-pay | Admitting: Adult Health

## 2019-03-13 ENCOUNTER — Other Ambulatory Visit: Payer: Self-pay

## 2019-03-13 ENCOUNTER — Ambulatory Visit (INDEPENDENT_AMBULATORY_CARE_PROVIDER_SITE_OTHER): Payer: PPO | Admitting: Adult Health

## 2019-03-13 VITALS — BP 167/98 | HR 71 | Ht <= 58 in | Wt 164.0 lb

## 2019-03-13 DIAGNOSIS — E108 Type 1 diabetes mellitus with unspecified complications: Secondary | ICD-10-CM | POA: Diagnosis not present

## 2019-03-13 DIAGNOSIS — E079 Disorder of thyroid, unspecified: Secondary | ICD-10-CM | POA: Diagnosis not present

## 2019-03-13 DIAGNOSIS — R079 Chest pain, unspecified: Secondary | ICD-10-CM

## 2019-03-13 DIAGNOSIS — I480 Paroxysmal atrial fibrillation: Secondary | ICD-10-CM | POA: Diagnosis not present

## 2019-03-13 DIAGNOSIS — E7800 Pure hypercholesterolemia, unspecified: Secondary | ICD-10-CM | POA: Diagnosis not present

## 2019-03-13 DIAGNOSIS — I10 Essential (primary) hypertension: Secondary | ICD-10-CM

## 2019-03-13 DIAGNOSIS — R55 Syncope and collapse: Secondary | ICD-10-CM

## 2019-03-13 NOTE — Patient Instructions (Signed)
Medication Instructions:  Continue current medications  If you need a refill on your cardiac medications before your next appointment, please call your pharmacy.  Labwork: None Ordered   Testing/Procedures: Your physician has requested that you have a lexiscan myoview. For further information please visit [email or web address]. Please follow instruction sheet, as given.  Follow-Up: . Your physician recommends that you schedule a follow-up appointment in: Keep appointment with Dr [name] in August   At [facility], you and your health needs are our priority.  As part of our continuing mission to provide you with exceptional heart care, we have created designated Provider Care Teams.  These Care Teams include your primary Cardiologist (physician) and Advanced Practice Providers (APPs -  Physician Assistants and Nurse Practitioners) who all work together to provide you with the care you need, when you need it.  Thank you for choosing [facility] at [facility]!!

## 2019-03-15 ENCOUNTER — Encounter: Payer: Self-pay | Admitting: *Deleted

## 2019-03-16 ENCOUNTER — Telehealth: Payer: Self-pay

## 2019-03-16 NOTE — Telephone Encounter (Signed)
Charlann Noss from [facility] called to get orders for their program for pt to receive pallatiative care. Nicki Reaper states the services are free because they are through [employer] Please advise.

## 2019-03-16 NOTE — Telephone Encounter (Signed)
We need more information, there is nothing from her hospital discharge discussing palliative care or why she would need palliative care.  Please call them back and clarify this? Also did they speak to patient about this program?  Document phone number in chart in case I need to call them back

## 2019-03-27 ENCOUNTER — Other Ambulatory Visit: Payer: Self-pay

## 2019-03-27 ENCOUNTER — Telehealth (HOSPITAL_COMMUNITY): Payer: Self-pay | Admitting: *Deleted

## 2019-03-27 NOTE — Telephone Encounter (Signed)
Close encounter 

## 2019-03-28 ENCOUNTER — Ambulatory Visit (INDEPENDENT_AMBULATORY_CARE_PROVIDER_SITE_OTHER): Payer: PPO | Admitting: Family Medicine

## 2019-03-28 ENCOUNTER — Ambulatory Visit: Payer: PPO | Admitting: Family Medicine

## 2019-03-28 ENCOUNTER — Encounter: Payer: Self-pay | Admitting: *Deleted

## 2019-03-28 ENCOUNTER — Encounter: Payer: Self-pay | Admitting: Family Medicine

## 2019-03-28 VITALS — BP 140/78 | HR 88 | Temp 98.7°F | Resp 16 | Ht <= 58 in | Wt 158.0 lb

## 2019-03-28 DIAGNOSIS — I480 Paroxysmal atrial fibrillation: Secondary | ICD-10-CM

## 2019-03-28 DIAGNOSIS — M0579 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 without organ or systems involvement: Secondary | ICD-10-CM | POA: Diagnosis not present

## 2019-03-28 DIAGNOSIS — K219 Gastro-esophageal reflux disease without esophagitis: Secondary | ICD-10-CM

## 2019-03-28 DIAGNOSIS — I10 Essential (primary) hypertension: Secondary | ICD-10-CM | POA: Diagnosis not present

## 2019-03-28 DIAGNOSIS — E039 Hypothyroidism, unspecified: Secondary | ICD-10-CM

## 2019-03-28 DIAGNOSIS — S72492D Other fracture of lower end of left femur, subsequent encounter for closed fracture with routine healing: Secondary | ICD-10-CM

## 2019-03-28 DIAGNOSIS — E785 Hyperlipidemia, unspecified: Secondary | ICD-10-CM

## 2019-03-28 DIAGNOSIS — E119 Type 2 diabetes mellitus without complications: Secondary | ICD-10-CM | POA: Diagnosis not present

## 2019-03-28 DIAGNOSIS — D649 Anemia, unspecified: Secondary | ICD-10-CM

## 2019-03-28 HISTORY — DX: Hypothyroidism, unspecified: E03.9

## 2019-03-28 MED ORDER — METFORMIN HCL 500 MG PO TABS: 500.0000 mg | ORAL_TABLET | Freq: Every day | ORAL | 1 refills | Status: DC

## 2019-03-28 MED ORDER — PANTOPRAZOLE SODIUM 40 MG PO TBEC: 40.0000 mg | DELAYED_RELEASE_TABLET | Freq: Every day | ORAL | 3 refills | Status: DC

## 2019-03-28 NOTE — Assessment & Plan Note (Signed)
Rate controlled and she is on Eliquis.  Per cardiology

## 2019-03-28 NOTE — Patient Instructions (Addendum)
Take protonix for reflux  I will get you appointment with orthopedics  We will call with lab results  Take metformin once a day in the morning only  F/U 3 months

## 2019-03-28 NOTE — Telephone Encounter (Signed)
This encounter was created in error - please disregard.

## 2019-03-28 NOTE — Assessment & Plan Note (Signed)
Bp l looks okay.  She has had recent changes in her medications.  I think some of her dizziness in the mornings likely be adjustment with the beta-blocker and the new calcium channel blocker.  She is not currently having any symptoms.  We will follow-up with cardiology tomorrow for her stress test.

## 2019-03-28 NOTE — Assessment & Plan Note (Signed)
ReCheck thyroid function studies.

## 2019-03-28 NOTE — Assessment & Plan Note (Signed)
I will obtain records from her last rheumatology visit.

## 2019-03-28 NOTE — Assessment & Plan Note (Signed)
Pepcid is not helping.  We will put her on pantoprazole in the setting of her Eliquis

## 2019-03-28 NOTE — Progress Notes (Signed)
Subjective:    Patient ID: [name], female    DOB: August 06, 1946, 73 y.o.   MRN: [id number]  Patient presents for Follow-up (is fasting)    She was followed by PA who has left practice  - Comprehensive review of her medical problems/meds  done   RA- followed by Dr. Bronson Curb- on Jolee Ewing / Enbrel injections / folic acid and voltaren gel / robaxin as needed    PAF- followed by cardiology, due for stress test tomorrow . She was hospitalized due to illnes with dehydraiton found  To be in A fib, she suffered a fall and chip fracture lateral femur- she has a long knee brace   she was started on eliquis 5mg  twice a day, now on lopressor BID and diltazem .   She was taken off clonidine and norvasc   Notation from hospital discharge, concerned for possible for OSA, recommended sleep assessemnt   She does feel a little on dizzy side in the mornings, no N/V    Now has rolling walker since her fall    Lives with family members    Hypothyroidism- now on leveothyroxine in June now on 50 mcg once a day     Diabetes type 2- Taking metformin 500mg  twice a day, last A1C 6.4% in [place], has not checked CBG at home   - on Lipitor 10mg  at bedtime    Dr. Philis Pique- on estrace twice a day, gets mamogram from GYN    GERD- taken off omeprazole in hospital assume due to eliquis, pepcid is not helping    Iron def- on iron tablets daily and B12 daily- states both of these were placed years ago, not sure if she still needs them   Dr. Nira Conn Doctor on [address]     Review Of Systems:  GEN- denies fatigue, fever, weight loss,weakness, recent illness HEENT- denies eye drainage, change in vision, nasal discharge, CVS- denies chest pain, palpitations RESP- denies SOB, cough, wheeze ABD- denies N/V, change in stools, abd pain GU- denies dysuria, hematuria, dribbling, incontinence MSK-+ joint pain, muscle aches, injury Neuro- denies headache, dizziness, syncope, seizure activity       Objective:     BP 140/78   Pulse 88   Temp 98.7 F (37.1 C) (Oral)   Resp 16   Ht 4\' 10"  (1.473 m)   Wt 158 lb (71.7 kg)   SpO2 94%   BMI 33.02 kg/m  GEN- NAD, alert and oriented x3,sitting in chair  HEENT- PERRL, EOMI, non injected sclera, pink conjunctiva, MMM, oropharynx clear Neck- Supple, no thyromegaly CVS- RRR, no murmur RESP-CTAB ABD-NABS,soft,NT,ND EXT- No edema , left leg in brace Pulses- Radial, DP- 2+        Assessment & Plan:      Problem List Items Addressed This Visit      Unprioritized   Anemia    I will recheck her B12 and her iron level see if she needs to be on the supplements long-term.  She should continue the folic acid due to her medications for her rheumatoid arthritis.      Relevant Orders   Iron, TIBC and Ferritin Panel   Vitamin B12   Atrial fibrillation ([place])    Rate controlled and she is on Eliquis.  Per cardiology      Diabetes mellitus type 2, uncomplicated (HCC)   Relevant Medications   metFORMIN (GLUCOPHAGE) 500 MG tablet   Essential hypertension, benign - Primary    Bp l looks okay.  She has had recent changes in her medications.  I think some of her dizziness in the mornings likely be adjustment with the beta-blocker and the new calcium channel blocker.  She is not currently having any symptoms.  We will follow-up with cardiology tomorrow for her stress test.      Relevant Orders   CBC with Differential/Platelet   Comprehensive metabolic panel   GERD (gastroesophageal reflux disease)    Pepcid is not helping.  We will put her on pantoprazole in the setting of her Eliquis      Relevant Medications   pantoprazole (PROTONIX) 40 MG tablet   Hyperlipidemia   Relevant Orders   Lipid panel   Hypothyroidism    ReCheck thyroid function studies.      Relevant Orders   TSH   T4, free   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 without organ or systems involvement ([place])    I will obtain records from her last rheumatology  visit.       Other Visit Diagnoses    Other closed fracture of distal end of left femur with routine healing, subsequent encounter       Referral to orthopedics for further evaluation and treatment   Relevant Orders   Ambulatory referral to Orthopedic Surgery      Note: This dictation was prepared with Dragon dictation along with smaller phrase technology. Any transcriptional errors that result from this process are unintentional.

## 2019-03-28 NOTE — Assessment & Plan Note (Signed)
I will recheck her B12 and her iron level see if she needs to be on the supplements long-term.  She should continue the folic acid due to her medications for her rheumatoid arthritis.

## 2019-03-29 ENCOUNTER — Other Ambulatory Visit: Payer: Self-pay | Admitting: Family Medicine

## 2019-03-29 ENCOUNTER — Ambulatory Visit (HOSPITAL_COMMUNITY)
Admission: RE | Admit: 2019-03-29 | Discharge: 2019-03-29 | Disposition: A | Discharge status: Home / Self Care | Payer: PPO | Source: Ambulatory Visit | Attending: Cardiology | Admitting: Cardiology

## 2019-03-29 ENCOUNTER — Other Ambulatory Visit: Payer: Self-pay

## 2019-03-29 DIAGNOSIS — R079 Chest pain, unspecified: Secondary | ICD-10-CM | POA: Insufficient documentation

## 2019-03-29 DIAGNOSIS — R5383 Other fatigue: Secondary | ICD-10-CM

## 2019-03-29 DIAGNOSIS — R946 Abnormal results of thyroid function studies: Secondary | ICD-10-CM

## 2019-03-29 LAB — MYOCARDIAL PERFUSION IMAGING
LV dias vol: 77 mL (ref 46–106)
LV sys vol: 27 mL
Peak HR: 91 {beats}/min
Rest HR: 67 {beats}/min
SDS: 0
SRS: 0
SSS: 0
TID: 1.13

## 2019-03-29 LAB — CBC WITH DIFFERENTIAL/PLATELET
Absolute Monocytes: 1058 cells/uL — ABNORMAL HIGH (ref 200–950)
Basophils Absolute: 41 cells/uL (ref 0–200)
Basophils Relative: 0.5 %
Eosinophils Absolute: 467 cells/uL (ref 15–500)
Eosinophils Relative: 5.7 %
HCT: 40.3 % (ref 35.0–45.0)
Hemoglobin: 13.3 g/dL (ref 11.7–15.5)
Lymphs Abs: 2181 cells/uL (ref 850–3900)
MCH: 31.8 pg (ref 27.0–33.0)
MCHC: 33 g/dL (ref 32.0–36.0)
MCV: 96.4 fL (ref 80.0–100.0)
MPV: 12.3 fL (ref 7.5–12.5)
Monocytes Relative: 12.9 %
Neutro Abs: 4453 cells/uL (ref 1500–7800)
Neutrophils Relative %: 54.3 %
Platelets: 228 10*3/uL (ref 140–400)
RBC: 4.18 10*6/uL (ref 3.80–5.10)
RDW: 13.1 % (ref 11.0–15.0)
Total Lymphocyte: 26.6 %
WBC: 8.2 10*3/uL (ref 3.8–10.8)

## 2019-03-29 LAB — LIPID PANEL
Cholesterol: 171 mg/dL (ref <200)
HDL: 70 mg/dL (ref >=50)
LDL Cholesterol (Calc): 80 mg/dL (calc)
Non-HDL Cholesterol (Calc): 101 mg/dL (calc) (ref <130)
Total CHOL/HDL Ratio: 2.4 (calc) (ref <5.0)
Triglycerides: 113 mg/dL (ref <150)

## 2019-03-29 LAB — VITAMIN B12: Vitamin B-12: 602 pg/mL (ref 200–1100)

## 2019-03-29 LAB — COMPREHENSIVE METABOLIC PANEL
AG Ratio: 1.5 (calc) (ref 1.0–2.5)
ALT: 18 U/L (ref 6–29)
AST: 23 U/L (ref 10–35)
Albumin: 3.8 g/dL (ref 3.6–5.1)
Alkaline phosphatase (APISO): 91 U/L (ref 37–153)
BUN: 20 mg/dL (ref 7–25)
CO2: 25 mmol/L (ref 20–32)
Calcium: 10 mg/dL (ref 8.6–10.4)
Chloride: 105 mmol/L (ref 98–110)
Creat: 0.9 mg/dL (ref 0.60–0.93)
Globulin: 2.6 g/dL (calc) (ref 1.9–3.7)
Glucose, Bld: 121 mg/dL — ABNORMAL HIGH (ref 65–99)
Potassium: 3.6 mmol/L (ref 3.5–5.3)
Sodium: 141 mmol/L (ref 135–146)
Total Bilirubin: 0.4 mg/dL (ref 0.2–1.2)
Total Protein: 6.4 g/dL (ref 6.1–8.1)

## 2019-03-29 LAB — IRON,TIBC AND FERRITIN PANEL
%SAT: 21 % (calc) (ref 16–45)
Ferritin: 548 ng/mL — ABNORMAL HIGH (ref 16–288)
Iron: 57 ug/dL (ref 45–160)
TIBC: 276 mcg/dL (calc) (ref 250–450)

## 2019-03-29 LAB — TSH: TSH: 2.95 mIU/L (ref 0.40–4.50)

## 2019-03-29 LAB — T4, FREE: Free T4: 1 ng/dL (ref 0.8–1.8)

## 2019-03-29 MED ORDER — REGADENOSON 0.4 MG/5ML IV SOLN
0.4000 mg | Freq: Once | INTRAVENOUS | Status: AC
  Administered 2019-03-29: 0.4 mg via INTRAVENOUS

## 2019-03-29 MED ORDER — TECHNETIUM TC 99M TETROFOSMIN IV KIT
29.3000 | PACK | Freq: Once | INTRAVENOUS | Status: AC | PRN
  Administered 2019-03-29: 29.3 via INTRAVENOUS
  Filled 2019-03-29: qty 30

## 2019-03-29 MED ORDER — TECHNETIUM TC 99M TETROFOSMIN IV KIT
10.4000 | PACK | Freq: Once | INTRAVENOUS | Status: AC | PRN
  Administered 2019-03-29: 10:00:00 10.4 via INTRAVENOUS
  Filled 2019-03-29: qty 11

## 2019-03-29 NOTE — Assessment & Plan Note (Signed)
Decrease metformin to once a day  A1C shows good control

## 2019-03-29 NOTE — Addendum Note (Signed)
Addended by: Vic Blackbird F on: 03/29/2019 09:26 AM   Modules accepted: Orders

## 2019-03-30 ENCOUNTER — Other Ambulatory Visit: Payer: Self-pay | Admitting: Family Medicine

## 2019-04-04 ENCOUNTER — Telehealth: Payer: Self-pay | Admitting: *Deleted

## 2019-04-04 ENCOUNTER — Other Ambulatory Visit: Payer: Self-pay

## 2019-04-04 ENCOUNTER — Encounter: Payer: Self-pay | Admitting: Cardiovascular Disease

## 2019-04-04 ENCOUNTER — Ambulatory Visit (INDEPENDENT_AMBULATORY_CARE_PROVIDER_SITE_OTHER): Payer: PPO | Admitting: Cardiovascular Disease

## 2019-04-04 DIAGNOSIS — E782 Mixed hyperlipidemia: Secondary | ICD-10-CM

## 2019-04-04 DIAGNOSIS — I480 Paroxysmal atrial fibrillation: Secondary | ICD-10-CM | POA: Diagnosis not present

## 2019-04-04 DIAGNOSIS — I10 Essential (primary) hypertension: Secondary | ICD-10-CM | POA: Diagnosis not present

## 2019-04-04 NOTE — Assessment & Plan Note (Signed)
History of hyperlipidemia on statin therapy with lipid profile performed 03/28/2019 revealing total cholesterol 171, LDL of 80 and HDL 70.

## 2019-04-04 NOTE — Telephone Encounter (Signed)
14 day ZIO XT long term holter monitor to be mailed to patients home.  Instructions reviewed briefly as they are included in the monitor kit.

## 2019-04-04 NOTE — Patient Instructions (Addendum)
Medication Instructions:  Your physician recommends that you continue on your current medications as directed. Please refer to the Current Medication list given to you today. If you need a refill on your cardiac medications before your next appointment, please call your pharmacy.   Lab work: NONE If you have labs (blood work) drawn today and your tests are completely normal, you will receive your results only by: Marland Kitchen MyChart Message (if you have MyChart) OR . A paper copy in the mail If you have any lab test that is abnormal or we need to change your treatment, we will call you to review the results.  Testing/Procedures: Your physician has recommended that you wear a 14 DAY ZIO-PATCH monitor. The Zio patch cardiac monitor continuously records heart rhythm data for up to 14 days, this is for patients being evaluated for multiple types heart rhythms. For the first 24 hours post application, please avoid getting the Zio monitor wet in the shower or by excessive sweating during exercise. After that, feel free to carry on with regular activities. Keep soaps and lotions away from the ZIO XT Patch.  This will be placed at our [facility] location - [address], [place].   YOUR ZIO PATCH IS TO BE PLACED/ MAILED TO YOU IN 6 MONTHS.       Follow-Up: At [facility], you and your health needs are our priority.  As part of our continuing mission to provide you with exceptional heart care, we have created designated Provider Care Teams.  These Care Teams include your primary Cardiologist (physician) and Advanced Practice Providers (APPs -  Physician Assistants and Nurse Practitioners) who all work together to provide you with the care you need, when you need it. You will need a follow up appointment in APPROXIMATELY 6 months WITH KATHRYN LAWRENCE, DNP TO REVIEW THE RESULTS OF [place]. ANDA FOLLOW UP APPOINTMENT IN 12 MONTHS WITH DR. [name].  Please call our office 2 months in  advance to schedule [facility] appointment.

## 2019-04-04 NOTE — Addendum Note (Signed)
Addended by: Annita Brod on: 04/04/2019 10:37 AM   Modules accepted: Orders

## 2019-04-04 NOTE — Assessment & Plan Note (Addendum)
History of PAF admitted with syncope back in July treated with rate control and Eliquis oral anticoagulation.  The plan was 4 weeks of anticoagulation followed by outpatient DC cardioversion although today by exam she appears to be back in sinus rhythm.  Her EKG shows sinus rhythm today.  I am going to have her follow-up with Jory Sims, NP in 6 months.  Hopefully we can get a 2-week ZIO patch prior to that to demonstrate whether or not she has any occult AF and if not I would be comfortable stopping her oral anticoagulation.

## 2019-04-04 NOTE — Progress Notes (Signed)
04/04/2019 [name]   03-02-1946  [id number]  Primary Physician Alycia Rossetti, MD Primary Cardiologist: Lorretta Harp MD [name], [place]  HPI:  [name] is a 73 y.o. moderately overweight divorced Caucasian female mother of 1 daughter, grandmother 3 grandchildren referred from her hospitalization to be established in my practice.  Her primary care provider is Dr. Vic Blackbird.  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  Her risk factor profile is notable for remote tobacco abuse, family history with mother and father both who have had ischemic heart disease as well as treated hypertension, diabetes and hyperlipidemia.  She had been feeling sick with nausea vomiting and dehydration prior to her admission 03/02/2014.  She was discharged 4 days later.  She did have a 2D echo that was essentially normal and a Myoview stress test that was normal as well.  She denies chest pain or shortness of breath.  She was discharged home on Eliquis oral anticoagulation.  She is been fairly asymptomatic since.   Current Meds  Medication Sig  . apixaban (ELIQUIS) 5 MG TABS tablet Take 1 tablet (5 mg total) by mouth 2 (two) times daily.  Marland Kitchen atorvastatin (LIPITOR) 10 MG tablet TAKE 1 TABLET BY MOUTH EVERY DAY (Patient taking differently: Take 10 mg by mouth daily. )  . Calcium Carbonate-Vitamin D (CALCIUM 600 + D PO) Take 1 tablet by mouth 2 (two) times daily.  . cyanocobalamin 500 MCG tablet Take 1 tablet (500 mcg total) by mouth daily.  . diclofenac sodium (VOLTAREN) 1 % GEL Apply 3 g topically daily as needed (to painful sites (in 3 large joints)).   Marland Kitchen diltiazem (CARDIZEM CD) 360 MG 24 hr capsule TAKE 1 CAPSULE BY MOUTH EVERY DAY  . estradiol (ESTRACE) 0.5 MG tablet Take 0.5 mg by mouth 2 (two) times a day.  . etanercept (ENBREL SURECLICK) 50 MG/ML injection INJECT 50 MG (0.98 ML) UNDER THE SKIN ONCE A WEEK (Patient taking differently: Inject 50 mg into the skin  every Saturday at 6 PM. )  . fish oil-omega-3 fatty acids 1000 MG capsule Take 1 g by mouth at bedtime.   . folic acid (FOLVITE) 1 MG tablet TAKE 1 TABLET BY MOUTH TWICE A DAY  . leflunomide (ARAVA) 10 MG tablet TAKE 1 TABLET BY MOUTH EVERY DAY (Patient taking differently: TAKE 1 TABLET BY MOUTH EVERY DAY)  . levothyroxine (SYNTHROID) 50 MCG tablet Take 1 tablet (50 mcg total) by mouth daily before breakfast.  . metFORMIN (GLUCOPHAGE) 500 MG tablet Take 1 tablet (500 mg total) by mouth daily with breakfast.  . methocarbamol (ROBAXIN) 500 MG tablet Take 1 tablet (500 mg total) by mouth daily as needed for muscle spasms.  . metoprolol tartrate (LOPRESSOR) 50 MG tablet TAKE 1 TABLET BY MOUTH TWICE A DAY  . Multiple Vitamins-Minerals (MULTIVITAMINS THER. W/MINERALS) TABS Take 1 tablet by mouth daily.  . pantoprazole (PROTONIX) 40 MG tablet Take 1 tablet (40 mg total) by mouth daily.     Allergies  Allergen Reactions  . Magnesium-Containing Compounds Nausea Only and Other (See Comments)    Re: Mag-Ox at higher doses caused a lot of stomach upset (dose had to be decreased)  . Monopril [Fosinopril] Cough  . Norvasc [Amlodipine Besylate] Other (See Comments)    10 mg dose caused LE edema- can tolerate 5 mg dose    Social History   Socioeconomic History  . Marital status: Divorced  Spouse name: Not on file  . Number of children: Not on file  . Years of education: Not on file  . Highest education level: Not on file  Occupational History  . Not on file  Social Needs  . Financial resource strain: Not on file  . Food insecurity    Worry: Not on file    Inability: Not on file  . Transportation needs    Medical: Not on file    Non-medical: Not on file  Tobacco Use  . Smoking status: Former Smoker    Packs/day: 0.25    Years: 2.00    Pack years: 0.50    Types: Cigarettes  . Smokeless tobacco: Never Used  Substance and Sexual Activity  . Alcohol use: Yes    Comment: occasional  .  Drug use: Never  . Sexual activity: Never  Lifestyle  . Physical activity    Days per week: Not on file    Minutes per session: Not on file  . Stress: Not on file  Relationships  . Social Herbalist on phone: Not on file    Gets together: Not on file    Attends religious service: Not on file    Active member of club or organization: Not on file    Attends meetings of clubs or organizations: Not on file    Relationship status: Not on file  . Intimate partner violence    Fear of current or ex partner: Not on file    Emotionally abused: Not on file    Physically abused: Not on file    Forced sexual activity: Not on file  Other Topics Concern  . Not on file  Social History Narrative  . Not on file     Review of Systems: General: negative for chills, fever, night sweats or weight changes.  Cardiovascular: negative for chest pain, dyspnea on exertion, edema, orthopnea, palpitations, paroxysmal nocturnal dyspnea or shortness of breath Dermatological: negative for rash Respiratory: negative for cough or wheezing Urologic: negative for hematuria Abdominal: negative for nausea, vomiting, diarrhea, bright red blood per rectum, melena, or hematemesis Neurologic: negative for visual changes, syncope, or dizziness All other systems reviewed and are otherwise negative except as noted above.    Blood pressure (!) 148/66, pulse 68, temperature (!) 97.2 F (36.2 C), temperature source Temporal, height 4\' 10"  (1.473 m), weight 159 lb 9.6 oz (72.4 kg).  General appearance: alert and no distress Neck: no adenopathy, no carotid bruit, no JVD, supple, symmetrical, trachea midline and thyroid not enlarged, symmetric, no tenderness/mass/nodules Lungs: clear to auscultation bilaterally Heart: regular rate and rhythm, S1, S2 normal, no murmur, click, rub or gallop Extremities: extremities normal, atraumatic, no cyanosis or edema Pulses: 2+ and symmetric Skin: Skin color, texture, turgor  normal. No rashes or lesions Neurologic: Alert and oriented X 3, normal strength and tone. Normal symmetric reflexes. Normal coordination and gait  EKG normal sinus rhythm at 62 without ST or T wave changes.  I personally reviewed this EKG.   ASSESSMENT AND PLAN:   Hyperlipidemia History of hyperlipidemia on statin therapy with lipid profile performed 03/28/2019 revealing total cholesterol 171, LDL of 80 and HDL 70.  Essential hypertension, benign History of essential hypertension with blood pressure measured today 148/66.  She is on diltiazem and metoprolol.  Atrial fibrillation ([place]) History of PAF admitted with syncope back in July treated with rate control and Eliquis oral anticoagulation.  The plan was 4 weeks of anticoagulation followed by outpatient  DC cardioversion although today by exam she appears to be back in sinus rhythm.  Her EKG shows sinus rhythm today.  I am going to have her follow-up with Jory Sims, NP in 6 months.  Hopefully we can get a 2-week ZIO patch prior to that to demonstrate whether or not she has any occult AF and if not I would be comfortable stopping her oral anticoagulation.      Lorretta Harp MD FACP,FACC,FAHA, [facility] 04/04/2019 10:24 AM

## 2019-04-04 NOTE — Assessment & Plan Note (Signed)
History of essential hypertension with blood pressure measured today 148/66.  She is on diltiazem and metoprolol.

## 2019-04-05 DIAGNOSIS — Z96652 Presence of left artificial knee joint: Secondary | ICD-10-CM | POA: Diagnosis not present

## 2019-04-05 DIAGNOSIS — M25562 Pain in left knee: Secondary | ICD-10-CM | POA: Diagnosis not present

## 2019-04-05 NOTE — Addendum Note (Signed)
Addended by: Therisa Doyne on: 04/05/2019 04:50 PM   Modules accepted: Orders

## 2019-04-13 ENCOUNTER — Telehealth: Payer: Self-pay | Admitting: Cardiovascular Disease

## 2019-04-13 ENCOUNTER — Ambulatory Visit (INDEPENDENT_AMBULATORY_CARE_PROVIDER_SITE_OTHER): Payer: PPO

## 2019-04-13 DIAGNOSIS — I480 Paroxysmal atrial fibrillation: Secondary | ICD-10-CM

## 2019-04-13 NOTE — Telephone Encounter (Signed)
New Message   Pt c/o medication issue:  1. Name of Medication: apixaban (ELIQUIS) 5 MG TABS tablet    diltiazem (CARDIZEM CD) 360 MG 24 hr capsule  2. How are you currently taking this medication (dosage and times per day)? Eliquis twice a day and diltiazem once a day  3. Are you having a reaction (difficulty breathing--STAT)? No  4. What is your medication issue? Lightheadedness, dizziness, SOB, and feeling like she is going to pass out. Happens a couple times a day, it's not continuous. Not having symptoms right now.

## 2019-04-13 NOTE — Telephone Encounter (Signed)
Spoke to patient -  RN informed that the above medciation do not ususally cause the sx .   An moniotr was ordered. Patient staes she just received monitor but has not  Started wearing it - RN  Instructed  Patient to  Place monitor on in the morning and wear it the amount of time and mail it.  Maybe the reason why Dr [name] wanted her to wear the monitor to get some  Information   RN informed patient if any  Issue over weekend may call office - on call person  patient verbalized understanding

## 2019-04-20 DIAGNOSIS — E119 Type 2 diabetes mellitus without complications: Secondary | ICD-10-CM | POA: Diagnosis not present

## 2019-04-25 ENCOUNTER — Encounter: Payer: Self-pay | Admitting: Physician Assistant

## 2019-04-25 DIAGNOSIS — Z96653 Presence of artificial knee joint, bilateral: Secondary | ICD-10-CM | POA: Diagnosis not present

## 2019-04-25 DIAGNOSIS — Z90710 Acquired absence of both cervix and uterus: Secondary | ICD-10-CM | POA: Diagnosis not present

## 2019-04-25 DIAGNOSIS — R29890 Loss of height: Secondary | ICD-10-CM | POA: Diagnosis not present

## 2019-04-25 DIAGNOSIS — R634 Abnormal weight loss: Secondary | ICD-10-CM | POA: Diagnosis not present

## 2019-04-25 DIAGNOSIS — M069 Rheumatoid arthritis, unspecified: Secondary | ICD-10-CM | POA: Diagnosis not present

## 2019-04-25 DIAGNOSIS — M8589 Other specified disorders of bone density and structure, multiple sites: Secondary | ICD-10-CM | POA: Diagnosis not present

## 2019-04-26 ENCOUNTER — Ambulatory Visit (INDEPENDENT_AMBULATORY_CARE_PROVIDER_SITE_OTHER): Payer: PPO | Admitting: Family Medicine

## 2019-04-26 ENCOUNTER — Other Ambulatory Visit: Payer: Self-pay

## 2019-04-26 VITALS — BP 156/90 | HR 86 | Temp 98.3°F | Resp 18 | Ht <= 58 in | Wt 166.0 lb

## 2019-04-26 DIAGNOSIS — K047 Periapical abscess without sinus: Secondary | ICD-10-CM | POA: Diagnosis not present

## 2019-04-26 MED ORDER — AMOXICILLIN-POT CLAVULANATE 875-125 MG PO TABS: 1.0000 | ORAL_TABLET | Freq: Two times a day (BID) | ORAL | 0 refills | Status: DC

## 2019-04-26 NOTE — Progress Notes (Signed)
Subjective:    Patient ID: [name], female    DOB: [birth year], 73 y.o.   MRN: [id number]  HPI Patient developed pain and swelling on the right side of her face starting yesterday evening.  The skin on the right side of her face is erythematous painful and swollen and warm.  Please see the photograph below.   The photograph seems to pick a facial droop.  However this is due to the swelling.  The patient is able to smile and raise her right cheek although it hurts.  She is able to close her right tightly and open her right widely.  She is able to for her brow.  She denies any numbness or tingling in her face.  Cranial nerves II through XII are grossly intact with no evidence of a stroke.  However the face is erythematous and swollen.  Examination of the oral cavity reveals absent dentition in the upper right molar.  There is decay and periodontal inflammation.  There is swelling in the gum which is tender to palpation.  Please see the diagram below Past Medical History:  Diagnosis Date  . Arthritis    osteoarthritis  . Bursitis of right shoulder   . Chronic cholecystitis with calculus 05/04/2013  . 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 Diabetes type 2, controlled ([place]) 05/04/2013  . Fatty liver disease, nonalcoholic [id number]   hospitalized, MRCP dx fatty liver  . GERD (gastroesophageal reflux disease)   . History of hypertension 08/20/2016  . Hormone replacement therapy (postmenopausal)   . Hormone replacement therapy (postmenopausal)   . Hyperlipidemia   . Hypertension   . Hypertensive urgency 07/11/2018  . Hypokalemia 07/11/2018  . Malnutrition of moderate degree ([place]) 03/18/2014  . Nausea & vomiting 07/11/2018  . Normal cardiac stress test    pt can't remember when or where  . Obesity, unspecified 12/27/2012  . Rheumatoid arthritis(714.0) 09/28/2012  . Sepsis ([place]) 03/16/2014  . SIRS (systemic inflammatory response syndrome) ([place]) 07/11/2018  .  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 Unspecified essential hypertension 05/04/2013   Past Surgical History:  Procedure Laterality Date  . ABDOMINAL HYSTERECTOMY    . CHOLECYSTECTOMY N/A 05/04/2013   Procedure: LAPAROSCOPIC CHOLECYSTECTOMY WITH INTRAOPERATIVE CHOLANGIOGRAM;  Surgeon: Haywood Lasso, MD;  Location: [facility];  Service: General;  Laterality: N/A;  . ERCP N/A 05/02/2013   Procedure: ENDOSCOPIC RETROGRADE CHOLANGIOPANCREATOGRAPHY (ERCP);  Surgeon: Beryle Beams, MD;  Location: [facility];  Service: Endoscopy;  Laterality: N/A;  . JOINT REPLACEMENT     left knee  . NASAL SINUS SURGERY    . SPHINCTEROTOMY  05/02/2013   Procedure: SPHINCTEROTOMY;  Surgeon: Beryle Beams, MD;  Location: [facility];  Service: Endoscopy;;  . TONSILLECTOMY    . TOTAL KNEE ARTHROPLASTY  08/01/2012   Procedure: TOTAL KNEE ARTHROPLASTY;  Surgeon: Meredith Pel, MD;  Location: [facility];  Service: Orthopedics;  Laterality: Right;  Right total knee arthroplasty  . TOTAL KNEE ARTHROPLASTY  08/01/2012   RIGHT  KNEE   Current Outpatient Medications on File Prior to Visit  Medication Sig Dispense Refill  . apixaban (ELIQUIS) 5 MG TABS tablet Take 1 tablet (5 mg total) by mouth 2 (two) times daily. 60 tablet 1  . atorvastatin (LIPITOR) 10 MG tablet TAKE 1 TABLET BY MOUTH EVERY DAY (Patient taking differently: Take 10 mg by mouth daily. ) 90 tablet 3  . Calcium Carbonate-Vitamin D (CALCIUM 600 + D  PO) Take 1 tablet by mouth 2 (two) times daily.    . cyanocobalamin 500 MCG tablet Take 1 tablet (500 mcg total) by mouth daily. 30 tablet 0  . diclofenac sodium (VOLTAREN) 1 % GEL Apply 3 g topically daily as needed (to painful sites (in 3 large joints)).     Marland Kitchen diltiazem (CARDIZEM CD) 360 MG 24 hr capsule TAKE 1 CAPSULE BY MOUTH EVERY DAY 30 capsule 2  . estradiol (ESTRACE) 0.5 MG tablet Take 0.5 mg by mouth 2 (two) times a day.    . etanercept (ENBREL SURECLICK) 50 MG/ML injection INJECT 50 MG  (0.98 ML) UNDER THE SKIN ONCE A WEEK (Patient taking differently: Inject 50 mg into the skin every Saturday at 6 PM. ) 12 Syringe 0  . fish oil-omega-3 fatty acids 1000 MG capsule Take 1 g by mouth at bedtime.     . folic acid (FOLVITE) 1 MG tablet TAKE 1 TABLET BY MOUTH TWICE A DAY 180 tablet 1  . leflunomide (ARAVA) 10 MG tablet TAKE 1 TABLET BY MOUTH EVERY DAY (Patient taking differently: TAKE 1 TABLET BY MOUTH EVERY DAY) 90 tablet 0  . levothyroxine (SYNTHROID) 50 MCG tablet Take 1 tablet (50 mcg total) by mouth daily before breakfast. 90 tablet 0  . metFORMIN (GLUCOPHAGE) 500 MG tablet Take 1 tablet (500 mg total) by mouth daily with breakfast. 90 tablet 1  . methocarbamol (ROBAXIN) 500 MG tablet Take 1 tablet (500 mg total) by mouth daily as needed for muscle spasms. 30 tablet 0  . metoprolol tartrate (LOPRESSOR) 50 MG tablet TAKE 1 TABLET BY MOUTH TWICE A DAY 60 tablet 2  . Multiple Vitamins-Minerals (MULTIVITAMINS THER. W/MINERALS) TABS Take 1 tablet by mouth daily.    . pantoprazole (PROTONIX) 40 MG tablet Take 1 tablet (40 mg total) by mouth daily. 30 tablet 3   No current facility-administered medications on file prior to visit.    Allergies  Allergen Reactions  . Magnesium-Containing Compounds Nausea Only and Other (See Comments)    Re: Mag-Ox at higher doses caused a lot of stomach upset (dose had to be decreased)  . Monopril [Fosinopril] Cough  . Norvasc [Amlodipine Besylate] Other (See Comments)    10 mg dose caused LE edema- can tolerate 5 mg dose   Social History   Socioeconomic History  . Marital status: Divorced    Spouse name: Not on file  . Number of children: Not on file  . Years of education: Not on file  . Highest education level: Not on file  Occupational History  . Not on file  Social Needs  . Financial resource strain: Not on file  . Food insecurity    Worry: Not on file    Inability: Not on file  . Transportation needs    Medical: Not on file     Non-medical: Not on file  Tobacco Use  . Smoking status: Former Smoker    Packs/day: 0.25    Years: 2.00    Pack years: 0.50    Types: Cigarettes  . Smokeless tobacco: Never Used  Substance and Sexual Activity  . Alcohol use: Yes    Comment: occasional  . Drug use: Never  . Sexual activity: Never  Lifestyle  . Physical activity    Days per week: Not on file    Minutes per session: Not on file  . Stress: Not on file  Relationships  . Social connections    Talks on phone: Not on file  Gets together: Not on file    Attends religious service: Not on file    Active member of club or organization: Not on file    Attends meetings of clubs or organizations: Not on file    Relationship status: Not on file  . Intimate partner violence    Fear of current or ex partner: Not on file    Emotionally abused: Not on file    Physically abused: Not on file    Forced sexual activity: Not on file  Other Topics Concern  . Not on file  Social History Narrative  . Not on file     Review of Systems  All other systems reviewed and are negative.      Objective:   Physical Exam HENT:     Head:      Mouth/Throat:   Cardiovascular:     Rate and Rhythm: Normal rate and regular rhythm.     Pulses: Normal pulses.     Heart sounds: Normal heart sounds.  Pulmonary:     Effort: Pulmonary effort is normal. No respiratory distress.     Breath sounds: Normal breath sounds. No stridor. No wheezing, rhonchi or rales.  Chest:     Chest wall: No tenderness.  Abdominal:     General: Abdomen is flat. Bowel sounds are normal. There is no distension.     Palpations: Abdomen is soft. There is no mass.     Tenderness: There is no abdominal tenderness. There is no guarding or rebound.     Hernia: No hernia is present.  Musculoskeletal:     Right lower leg: No edema.     Left lower leg: No edema.  Lymphadenopathy:     Cervical: No cervical adenopathy.  Neurological:     Mental Status: She is  alert.           Assessment & Plan:  Periapical abscess  Patient has an abscessed tooth with spreading infection.  Begin Augmentin 875 mg p.o. twice daily for 10 days.  Advised the patient to call her dentist today and make an appointment as she may require surgical drainage and definitive management of the periodontal infection.  Patient will notify us if she is unable to get an appoint with her dentist.

## 2019-04-27 ENCOUNTER — Telehealth: Payer: Self-pay | Admitting: *Deleted

## 2019-04-27 ENCOUNTER — Telehealth: Payer: Self-pay | Admitting: Family Medicine

## 2019-04-27 ENCOUNTER — Telehealth: Payer: Self-pay | Admitting: Rheumatology

## 2019-04-27 NOTE — Telephone Encounter (Signed)
Patient advised of results and recommendations.  

## 2019-04-27 NOTE — Telephone Encounter (Signed)
Received DEXA results from [facility]  Date of Scan: 04/25/2019 Lowest T-score and site measured: -1.4 Right Femoral Neck Significant changes in BMD and site measured (5% and above): N/A  Current Regimen: Calcium and Vitamin D  Recommendation: Continue taking Calcium and Vitamin D. Resistive Exercises.

## 2019-04-27 NOTE — Telephone Encounter (Signed)
Patient called requesting prescription refill of Enbrel to be sent to [facility].  Patient states she she was told there were no refills remaining and a new prescription needs to be sent before she can request delivery.  Patient states she only has one injection remaining and will take it on Monday, 04/30/19.

## 2019-04-27 NOTE — Telephone Encounter (Signed)
Yes I would

## 2019-04-27 NOTE — Telephone Encounter (Signed)
Patient called in states she was given amoxacillin yesterday and she wants to know if this is the antibiotic that she should take prior to them removing her teeth. She states the dentist advised her to call.  CB# [phone]

## 2019-04-27 NOTE — Telephone Encounter (Signed)
Patient states she is due to have several teeth removed on 05/07/19. Patient would like to know if she needs to hold her medication. Patient is currently on antibiotics and advised to hold [place] and Enbrel until she has completed the antibiotics and the infections has cleared.

## 2019-04-27 NOTE — Telephone Encounter (Signed)
Called and advised patient that it was fine for her to take amoxacillin. Patient verbalized understanding that she didn't need a different abx.

## 2019-04-27 NOTE — Telephone Encounter (Signed)
Attempted to contact the patient and left message for patient to call the office.  

## 2019-05-01 ENCOUNTER — Telehealth: Payer: Self-pay | Admitting: Orthopedic Surgery

## 2019-05-01 MED ORDER — ENBREL SURECLICK 50 MG/ML ~~LOC~~ SOAJ: SUBCUTANEOUS | Status: DC

## 2019-05-01 NOTE — Telephone Encounter (Signed)
Patient would like to know if she needs to take anything before having her dental surgery. Last seen Dr. Marlou Sa in 2013. Her call back number is [phone]. Thanks.

## 2019-05-01 NOTE — Telephone Encounter (Signed)
Patient advised she is due to update her TB gold. Patient verbalized understanding and will come to the office this week to update.   Last Visit: 02/28/19 Next Visit: due in December 2020. Message sent to the front to schedule patient  Labs: 03/28/19 glucose 121, Absolute Monocytes 1,058 TB gold: 04/21/18 Neg   Okay to refill 30 day supply per Dr. Estanislado Pandy

## 2019-05-02 ENCOUNTER — Telehealth: Payer: Self-pay | Admitting: Family Medicine

## 2019-05-02 DIAGNOSIS — I480 Paroxysmal atrial fibrillation: Secondary | ICD-10-CM | POA: Diagnosis not present

## 2019-05-02 MED ORDER — AMOXICILLIN 500 MG PO TABS: ORAL_TABLET | ORAL | 0 refills | Status: DC

## 2019-05-02 NOTE — Telephone Encounter (Signed)
Tried calling, no answer. LMVM advising that we sent this medication to her pharmacy.

## 2019-05-02 NOTE — Addendum Note (Signed)
Addended byLaurann Montana on: 05/02/2019 01:40 PM   Modules accepted: Orders

## 2019-05-02 NOTE — Telephone Encounter (Signed)
Please advise. Thanks.  

## 2019-05-02 NOTE — Telephone Encounter (Signed)
Received a phone call from [facility] stating that [facility] had referred patient to them for palliative care to have a nurse go out and assist patient. They would like to know if you would give verbal orders for this service. It is completely paid for by [facility] and is no cost to the patient. Please advise

## 2019-05-02 NOTE — Telephone Encounter (Signed)
Do you have a contact number, I have no specific reason why she would need palliative care services

## 2019-05-02 NOTE — Telephone Encounter (Signed)
Yes 2 g amox 1 hour before procedure

## 2019-05-03 ENCOUNTER — Telehealth: Payer: Self-pay | Admitting: Cardiovascular Disease

## 2019-05-03 NOTE — Telephone Encounter (Signed)
Patient with diagnosis of afib on Eliquis for anticoagulation.    Procedure: 3 teeth extracted  Date of procedure: 05/07/2019  CHADS2-VASc score of  4 (HTN, AGE, DM2, female)  CrCl 47 ml/min  Per office protocol, patient can hold Eliquis for 1 days prior to procedure.

## 2019-05-03 NOTE — Telephone Encounter (Signed)
   Primary Cardiologist: Quay Burow, MD  Chart reviewed as part of pre-operative protocol coverage. Patient was contacted 05/03/2019 in reference to pre-operative risk assessment for pending surgery as outlined below.  Venya Fiegl was last seen on 04/04/2019 by Dr. [name].  Since that day, Rim Kleintop has done fine from a cardiac standpoint. She is able to walk on level ground, perform ADLs, and do household chores without complaints of chest pain or SOB.  Therefore, based on ACC/AHA guidelines, the patient would be at acceptable risk for the planned procedure without further cardiovascular testing.   Per pharmacy recommendations, patient can hold eliquis 1 day prior to her upcoming dental extractions and should restart eliquis when cleared to do so by her dentist.   I will route this recommendation to the requesting party via [facility] fax function and remove from pre-op pool.  Please call with questions.  Abigail Butts, PA-C 05/03/2019, 1:43 PM

## 2019-05-03 NOTE — Telephone Encounter (Signed)
The contact number for the nurse with [facility] is [phone] her name is Recruitment consultant

## 2019-05-03 NOTE — Telephone Encounter (Signed)
New message       1. What dental office are you calling from? Dr. Othelia Pulling DDS  2. What is your office phone number? [phone]  3. What is your fax number?[phone]  4. What type of procedure is the patient having performed?3 teeth extracted   5. What date is procedure scheduled or is the patient there now? 05/07/2019 (if the patient is at the dentist's office question goes to their cardiologist if he/she is in the office.  If not, question should go to the DOD).   6. What is your question (ex. Antibiotics prior to procedure, holding medication-we need to know how long dentist wants pt to hold med)?Need Cardiac Clearance and what medications need to be withheld

## 2019-05-04 NOTE — Telephone Encounter (Signed)
-----   Message from [place], LPN sent at [id number]  4:35 PM EDT ----- Regarding: Return Call Received VM from Rhett Bannister, RN with [facility].   States that MD had called with questions in regards to services. Requested return call at [phone]~ telephone.

## 2019-05-04 NOTE — Telephone Encounter (Signed)
LEFT vm FOR Morton [name]

## 2019-05-04 NOTE — Telephone Encounter (Signed)
Spoke with [facility]   pt hospital admission with her comorbities triggered the referral through [facility]  I think she could benefit from services with extra care management and nursing even if temporarily Verbal order given

## 2019-05-08 ENCOUNTER — Other Ambulatory Visit: Payer: Self-pay | Admitting: Rheumatology

## 2019-05-08 NOTE — Telephone Encounter (Signed)
Last Visit:02/28/19 Next Visit: 08/01/19 Labs: 03/28/19 glucose   Okay to refill per Dr. Estanislado Pandy

## 2019-05-11 ENCOUNTER — Other Ambulatory Visit: Payer: Self-pay

## 2019-05-11 DIAGNOSIS — Z79899 Other long term (current) drug therapy: Secondary | ICD-10-CM

## 2019-05-13 LAB — QUANTIFERON-TB GOLD PLUS
Mitogen-NIL: 8.68 IU/mL
NIL: 0.04 IU/mL
QuantiFERON-TB Gold Plus: NEGATIVE
TB1-NIL: 0 IU/mL
TB2-NIL: 0 IU/mL

## 2019-05-14 NOTE — Progress Notes (Signed)
TB gold negative

## 2019-05-17 ENCOUNTER — Other Ambulatory Visit: Payer: Self-pay

## 2019-05-17 ENCOUNTER — Encounter: Payer: Self-pay | Admitting: Cardiovascular Disease

## 2019-05-17 ENCOUNTER — Ambulatory Visit (INDEPENDENT_AMBULATORY_CARE_PROVIDER_SITE_OTHER): Payer: PPO | Admitting: Cardiovascular Disease

## 2019-05-17 DIAGNOSIS — I455 Other specified heart block: Secondary | ICD-10-CM

## 2019-05-17 HISTORY — DX: Other specified heart block: I45.5

## 2019-05-17 NOTE — Progress Notes (Signed)
05/17/2019 [name]   Aug 18, 1946  [id number]  Primary Physician Alycia Rossetti, MD Primary Cardiologist: Lorretta Harp MD [name], [place]  HPI:  [name] is a 73 y.o.  moderately overweight divorced Caucasian female mother of 1 daughter, grandmother 3 grandchildren referred from her hospitalization to be established in my practice.  Her primary care provider is Dr. Vic Blackbird.  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  She retired 2 years ago.  I last saw her in the office 04/04/2019. Her risk factor profile is notable for remote tobacco abuse, family history with mother and father both who have had ischemic heart disease as well as treated hypertension, diabetes and hyperlipidemia.  She had been feeling sick with nausea vomiting and dehydration prior to her admission 03/02/2014.  She was discharged 4 days later.  She did have a 2D echo that was essentially normal and a Myoview stress test that was normal as well.  She denies chest pain or shortness of breath.  She was discharged home on Eliquis oral anticoagulation.  She is been fairly asymptomatic since.  I did place a 2-week ZIO patch on her which revealed sinus rhythm/sinus bradycardia, PVCs with episodes of nonsustained ventricular tachycardia and pauses up to 4 seconds.  She is on diltiazem and metoprolol.  She has complained of episodes of dizziness but no frank syncope.   No outpatient medications have been marked as taking for the 05/17/19 encounter (Office Visit) with Lorretta Harp, MD.     Allergies  Allergen Reactions  . Magnesium-Containing Compounds Nausea Only and Other (See Comments)    Re: Mag-Ox at higher doses caused a lot of stomach upset (dose had to be decreased)  . Monopril [Fosinopril] Cough  . Norvasc [Amlodipine Besylate] Other (See Comments)    10 mg dose caused LE edema- can tolerate 5 mg dose    Social History   Socioeconomic History  . Marital status:  Divorced    Spouse name: Not on file  . Number of children: Not on file  . Years of education: Not on file  . Highest education level: Not on file  Occupational History  . Not on file  Social Needs  . Financial resource strain: Not on file  . Food insecurity    Worry: Not on file    Inability: Not on file  . Transportation needs    Medical: Not on file    Non-medical: Not on file  Tobacco Use  . Smoking status: Former Smoker    Packs/day: 0.25    Years: 2.00    Pack years: 0.50    Types: Cigarettes  . Smokeless tobacco: Never Used  Substance and Sexual Activity  . Alcohol use: Yes    Comment: occasional  . Drug use: Never  . Sexual activity: Never  Lifestyle  . Physical activity    Days per week: Not on file    Minutes per session: Not on file  . Stress: Not on file  Relationships  . Social Herbalist on phone: Not on file    Gets together: Not on file    Attends religious service: Not on file    Active member of club or organization: Not on file    Attends meetings of clubs or organizations: Not on file    Relationship status: Not on file  . Intimate partner violence    Fear of current or  ex partner: Not on file    Emotionally abused: Not on file    Physically abused: Not on file    Forced sexual activity: Not on file  Other Topics Concern  . Not on file  Social History Narrative  . Not on file     Review of Systems: General: negative for chills, fever, night sweats or weight changes.  Cardiovascular: negative for chest pain, dyspnea on exertion, edema, orthopnea, palpitations, paroxysmal nocturnal dyspnea or shortness of breath Dermatological: negative for rash Respiratory: negative for cough or wheezing Urologic: negative for hematuria Abdominal: negative for nausea, vomiting, diarrhea, bright red blood per rectum, melena, or hematemesis Neurologic: negative for visual changes, syncope, or dizziness All other systems reviewed and are otherwise  negative except as noted above.    Blood pressure (!) 140/92, pulse 86, height 4\' 10"  (1.473 m), weight 171 lb (77.6 kg), SpO2 96 %.  General appearance: alert and no distress Neck: no adenopathy, no carotid bruit, no JVD, supple, symmetrical, trachea midline and thyroid not enlarged, symmetric, no tenderness/mass/nodules Lungs: clear to auscultation bilaterally Heart: regular rate and rhythm, S1, S2 normal, no murmur, click, rub or gallop Extremities: extremities normal, atraumatic, no cyanosis or edema Pulses: 2+ and symmetric Skin: Skin color, texture, turgor normal. No rashes or lesions Neurologic: Alert and oriented X 3, normal strength and tone. Normal symmetric reflexes. Normal coordination and gait  EKG not performed today  ASSESSMENT AND PLAN:   Sinus pause 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  Visio patch showed sinus rhythm/sinus bradycardia, occasional PVCs, nonsustained ventricular tachycardia and up to 4-second pauses.  She does complain of occasional episodes of dizziness but has not had frank syncope.  She is on diltiazem and metoprolol as well.  2D echo reveals normal LV function Myoview stress test was normal as well.  I am going to refer her to EP to address her arrhythmia and pauses in the setting of her current medical regimen.  Her ZIO patch did not show episodes of PAF suggesting she may not need long-term oral anticoagulation.  Essential hypertension, benign History of essential hypertension with initial blood pressure measured at 142/102 and subsequent measurement of 140/92.  She is on diltiazem and metoprolol.  She does not check her blood pressure at home.      Lorretta Harp MD FACP,FACC,FAHA, [facility] 05/17/2019 10:32 AM

## 2019-05-17 NOTE — Assessment & Plan Note (Signed)
History of essential hypertension with initial blood pressure measured at 142/102 and subsequent measurement of 140/92.  She is on diltiazem and metoprolol.  She does not check her blood pressure at home.

## 2019-05-17 NOTE — Patient Instructions (Signed)
Medication Instructions:  Your physician recommends that you continue on your current medications as directed. Please refer to the Current Medication list given to you today.  If you need a refill on your cardiac medications before your next appointment, please call your pharmacy.   Lab work: NONE If you have labs (blood work) drawn today and your tests are completely normal, you will receive your results only by: Marland Kitchen MyChart Message (if you have MyChart) OR . A paper copy in the mail If you have any lab test that is abnormal or we need to change your treatment, we will call you to review the results.  Testing/Procedures: NONE  Follow-Up: At [facility], you and your health needs are our priority.  As part of our continuing mission to provide you with exceptional heart care, we have created designated Provider Care Teams.  These Care Teams include your primary Cardiologist (physician) and Advanced Practice Providers (APPs -  Physician Assistants and Nurse Practitioners) who all work together to provide you with the care you need, when you need it. . You will need a follow up appointment in 3 months with an APP and in 6 months with Dr. Quay Burow.  Please call our office 2 months in advance to schedule each appointment.  You may see Dr. [name] or one of the following Advanced Practice Providers on your designated Care Team:   . Kerin Ransom, PA-C . Daleen Snook Kroeger, PA-C . Sande Rives, PA-C ____________________ . Almyra Deforest, PA-C . Fabian Sharp, PA-C . Jory Sims, DNP . Rosaria Ferries, PA-C   Any Other Special Instructions Will Be Listed Below (If Applicable). REFERRAL TO ELECTROPHYSIOLOGY FOR EVALUATION OF ARRYTHMIA (SINUS PAUSES)

## 2019-05-17 NOTE — Assessment & Plan Note (Addendum)
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  Visio patch showed sinus rhythm/sinus bradycardia, occasional PVCs, nonsustained ventricular tachycardia and up to 4-second pauses.  She does complain of occasional episodes of dizziness but has not had frank syncope.  She is on diltiazem and metoprolol as well.  2D echo reveals normal LV function Myoview stress test was normal as well.  I am going to refer her to EP to address her arrhythmia and pauses in the setting of her current medical regimen.  Her ZIO patch did not show episodes of PAF suggesting she may not need long-term oral anticoagulation.

## 2019-05-27 ENCOUNTER — Other Ambulatory Visit: Payer: Self-pay | Admitting: Family Medicine

## 2019-06-01 ENCOUNTER — Other Ambulatory Visit: Payer: Self-pay | Admitting: Cardiovascular Disease

## 2019-06-01 NOTE — Telephone Encounter (Signed)
° ° ° °*  STAT* If patient is at the pharmacy, call can be transferred to refill team. ? ? ?1. Which medications need to be refilled? (please list name of each medication and dose if known) (apixaban (ELIQUIS) 5 MG TABS tablet) ? ?2. Which pharmacy/location (including street and city if local pharmacy) is medication to be sent to? [id number] - [place], [place] - [address] ? ?3. Do they need a 30 day or 90 day supply? 30  ?

## 2019-06-04 MED ORDER — APIXABAN 5 MG PO TABS: 5.0000 mg | ORAL_TABLET | Freq: Two times a day (BID) | ORAL | 1 refills | Status: DC

## 2019-06-06 ENCOUNTER — Other Ambulatory Visit: Payer: Self-pay

## 2019-06-06 ENCOUNTER — Ambulatory Visit (INDEPENDENT_AMBULATORY_CARE_PROVIDER_SITE_OTHER): Payer: PPO | Admitting: Internal Medicine

## 2019-06-06 ENCOUNTER — Encounter: Payer: Self-pay | Admitting: Internal Medicine

## 2019-06-06 VITALS — BP 130/96 | HR 81 | Ht <= 58 in | Wt 180.0 lb

## 2019-06-06 DIAGNOSIS — I455 Other specified heart block: Secondary | ICD-10-CM

## 2019-06-06 DIAGNOSIS — I480 Paroxysmal atrial fibrillation: Secondary | ICD-10-CM | POA: Diagnosis not present

## 2019-06-06 LAB — CBC WITH DIFFERENTIAL/PLATELET
Basophils Absolute: 0 10*3/uL (ref 0.0–0.2)
Basos: 1 %
EOS (ABSOLUTE): 0.4 10*3/uL (ref 0.0–0.4)
Eos: 8 %
Hematocrit: 41.3 % (ref 34.0–46.6)
Hemoglobin: 13.3 g/dL (ref 11.1–15.9)
Immature Grans (Abs): 0 10*3/uL (ref 0.0–0.1)
Immature Granulocytes: 1 %
Lymphocytes Absolute: 1.8 10*3/uL (ref 0.7–3.1)
Lymphs: 34 %
MCH: 30.9 pg (ref 26.6–33.0)
MCHC: 32.2 g/dL (ref 31.5–35.7)
MCV: 96 fL (ref 79–97)
Monocytes Absolute: 1 10*3/uL — ABNORMAL HIGH (ref 0.1–0.9)
Monocytes: 18 %
Neutrophils Absolute: 2.1 10*3/uL (ref 1.4–7.0)
Neutrophils: 38 %
Platelets: 169 10*3/uL (ref 150–450)
RBC: 4.3 x10E6/uL (ref 3.77–5.28)
RDW: 12.7 % (ref 11.7–15.4)
WBC: 5.4 10*3/uL (ref 3.4–10.8)

## 2019-06-06 LAB — BASIC METABOLIC PANEL
BUN/Creatinine Ratio: 26 (ref 12–28)
BUN: 22 mg/dL (ref 8–27)
CO2: 23 mmol/L (ref 20–29)
Calcium: 9.4 mg/dL (ref 8.7–10.3)
Chloride: 104 mmol/L (ref 96–106)
Creatinine, Ser: 0.84 mg/dL (ref 0.57–1.00)
GFR calc Af Amer: 79 mL/min/{1.73_m2} (ref >59)
GFR calc non Af Amer: 68 mL/min/{1.73_m2} (ref >59)
Glucose: 138 mg/dL — ABNORMAL HIGH (ref 65–99)
Potassium: 4 mmol/L (ref 3.5–5.2)
Sodium: 142 mmol/L (ref 134–144)

## 2019-06-06 MED ORDER — FUROSEMIDE 40 MG PO TABS: ORAL_TABLET | ORAL | 0 refills | Status: DC

## 2019-06-06 NOTE — Patient Instructions (Addendum)
Medication Instructions:  Your physician has recommended you make the following change in your medication:   1.  Start taking lasix 40 mg---Take one tablet by mouth every other day for a week  Labwork: You will get lab work today:  BMP and CBC  Testing/Procedures:  Call me back about your sleeping/snoring.  Follow-Up:  See letter for instructions.  Any Other Special Instructions Will Be Listed Below (If Applicable).  If you need a refill on your cardiac medications before your next appointment, please call your pharmacy.    Pacemaker Implantation, Adult Pacemaker implantation is a procedure to place a pacemaker inside your chest. A pacemaker is a small computer that sends electrical signals to the heart and helps your heart beat normally. A pacemaker also stores information about your heart rhythms. You may need pacemaker implantation if you:  Have a slow heartbeat (bradycardia).  Faint (syncope).  Have shortness of breath (dyspnea) due to heart problems. The pacemaker attaches to your heart through a wire, called a lead. Sometimes just one lead is needed. Other times, there will be two leads. There are two types of pacemakers:  Transvenous pacemaker. This type is placed under the skin or muscle of your chest. The lead goes through a vein in the chest area to reach the inside of the heart.  Epicardial pacemaker. This type is placed under the skin or muscle of your chest or belly. The lead goes through your chest to the outside of the heart. Tell a health care provider about:  Any allergies you have.  All medicines you are taking, including vitamins, herbs, eye drops, creams, and over-the-counter medicines.  Any problems you or family members have had with anesthetic medicines.  Any blood or bone disorders you have.  Any surgeries you have had.  Any medical conditions you have.  Whether you are pregnant or may be pregnant. What are the risks? Generally, this is a safe  procedure. However, problems may occur, including:  Infection.  Bleeding.  Failure of the pacemaker or the lead.  Collapse of a lung or bleeding into a lung.  Blood clot inside a blood vessel with a lead.  Damage to the heart.  Infection inside the heart (endocarditis).  Allergic reactions to medicines. What happens before the procedure? Staying hydrated Follow instructions from your health care provider about hydration, which may include:  Up to 2 hours before the procedure - you may continue to drink clear liquids, such as water, clear fruit juice, black coffee, and plain tea. Eating and drinking restrictions Follow instructions from your health care provider about eating and drinking, which may include:  8 hours before the procedure - stop eating heavy meals or foods such as meat, fried foods, or fatty foods.  6 hours before the procedure - stop eating light meals or foods, such as toast or cereal.  6 hours before the procedure - stop drinking milk or drinks that contain milk.  2 hours before the procedure - stop drinking clear liquids. Medicines  Ask your health care provider about: ? Changing or stopping your regular medicines. This is especially important if you are taking diabetes medicines or blood thinners. ? Taking medicines such as aspirin and ibuprofen. These medicines can thin your blood. Do not take these medicines before your procedure if your health care provider instructs you not to.  You may be given antibiotic medicine to help prevent infection. General instructions  You will have a heart evaluation. This may include an electrocardiogram (ECG),  chest X-ray, and heart imaging (echocardiogram,  or echo) tests.  You will have blood tests.  Do not use any products that contain nicotine or tobacco, such as cigarettes and e-cigarettes. If you need help quitting, ask your health care provider.  Plan to have someone take you home from the hospital or  clinic.  If you will be going home right after the procedure, plan to have someone with you for 24 hours.  Ask your health care provider how your surgical site will be marked or identified. What happens during the procedure?  To reduce your risk of infection: ? Your health care team will wash or sanitize their hands. ? Your skin will be washed with soap. ? Hair may be removed from the surgical area.  An IV tube will be inserted into one of your veins.  You will be given one or more of the following: ? A medicine to help you relax (sedative). ? A medicine to numb the area (local anesthetic). ? A medicine to make you fall asleep (general anesthetic).  If you are getting a transvenous pacemaker: ? An incision will be made in your upper chest. ? A pocket will be made for the pacemaker. It may be placed under the skin or between layers of muscle. ? The lead will be inserted into a blood vessel that returns to the heart. ? While X-rays are taken by an imaging machine (fluoroscopy), the lead will be advanced through the vein to the inside of your heart. ? The other end of the lead will be tunneled under the skin and attached to the pacemaker.  If you are getting an epicardial pacemaker: ? An incision will be made near your ribs or breastbone (sternum) for the lead. ? The lead will be attached to the outside of your heart. ? Another incision will be made in your chest or upper belly to create a pocket for the pacemaker. ? The free end of the lead will be tunneled under the skin and attached to the pacemaker.  The transvenous or epicardial pacemaker will be tested. Imaging studies may be done to check the lead position.  The incisions will be closed with stitches (sutures), adhesive strips, or skin glue.  Bandages (dressing) will be placed over the incisions. The procedure may vary among health care providers and hospitals. What happens after the procedure?  Your blood pressure, heart  rate, breathing rate, and blood oxygen level will be monitored until the medicines you were given have worn off.  You will be given antibiotics and pain medicine.  ECG and chest x-rays will be done.  You will wear a continuous type of ECG (Holter monitor) to check your heart rhythm.  Your health care provider will program the pacemaker.  Do not drive for 24 hours if you received a sedative. This information is not intended to replace advice given to you by your health care provider. Make sure you discuss any questions you have with your health care provider. Document Released: 07/30/2002 Document Revised: 04/28/2018 Document Reviewed: 01/21/2016 Elsevier Patient Education  2020 [employer].

## 2019-06-06 NOTE — H&P (View-Only) (Signed)
ELECTROPHYSIOLOGY CONSULT NOTE  Patient ID: [name], MRN: [id number], DOB/AGE: 09/12/1945 73 y.o. Admit date: (Not on file) Date of Consult: 06/06/2019  Primary Physician: Alycia Rossetti, MD Primary Cardiologist: Kennedy Bucker is a 73 y.o. 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    HPI [name] is a 73 y.o. female was seen for atrial fibrillation and bradycardia arrhythmia.  She was admitted to hospital 7/20.  She was found to be in atrial fibrillation with rapid ventricular response.  Anticoagulation was initiated she was discharged in atrial fibrillation and on office follow-up she was noted to be in sinus rhythm.  She looks back and has noted recurrent tachypalpitations similar to her atrial fibrillation presentation.  She presented to the hospital back today because she had fallen.  It was presumed that she was dehydrated.  She remembers falling she does not remember landing.  She has had recurrent presyncope duration 2-3 seconds.  Also complains of exercise intolerance.  This is described as a heaviness sensation in her chest without radiation; it is almost always accompanied by palpitations and lightheadedness.  None of the symptoms occur at rest.  Thromboembolic risk factors ( age  -2, HTN-1, DM-1, Gender-1) for a CHADSVASc Score of 5    DATE TEST EF   7/20 Echo   60-65 % LAE (4.4/--/38)  8/20 MYOVIEW   64 % No ischemia        Sleep disordered breathing.  Daytime somnolence.  Date Cr K Hgb  8/20 0.9 3.6 13.3     Past Medical History:  Diagnosis Date  . Abrasion of skin with infection 05/09/2013  . Acute blood loss anemia 08/05/2012  . Anemia 11/21/2013  . Arthritis    osteoarthritis  . Atrial fibrillation (HCC)    Paroxysmal atrial fibrillation  . Bursitis of right shoulder   . Chronic cholecystitis with calculus 05/04/2013  . Diabetes mellitus without complication (HCC)    Type  2 NIDDM x 15 years  . Diabetes type 2, controlled ([place]) 05/04/2013  . Essential hypertension, benign 10/14/2012   Essential hypertension  . Fatty liver disease, nonalcoholic [id number]   hospitalized, MRCP dx fatty liver  . Gastroenteritis 10/16/2013  . GERD (gastroesophageal reflux disease)   . History of hypertension 08/20/2016  . History of total bilateral knee replacement 06/22/2017   Right= 2013. Left=2003  . Hormone replacement therapy (postmenopausal)   . 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 Hyperlipidemia   . Hypertension   . Hypertensive urgency 07/11/2018  . Hypokalemia 07/11/2018  . Hypomagnesemia 07/21/2016  . Hypothyroidism 03/28/2019  . Malnutrition of moderate degree ([place]) 03/18/2014  . Morbid obesity due to excess calories ([place]) 12/27/2012   Body mass index is 37.16 kg/m.  -  trending up  Lab Results Component Value Date  TSH 1.520 10/15/2013    Contributing to gerd risk/ doe/reviewed the need and the process to achieve and maintain neg calorie balance > defer f/u primary care including intermittently monitoring thyroid status    . Multiple pulmonary nodules 04/04/2014   Followed in Pulmonary clinic/ [facility]/ [place] - See CT chest 03/17/14 > not viz on abd CT from 10/16/13 as did not go high enough on lung slices >>>in tickle file for recall 06/17/14  - Spirometry 04/04/14 wnl sp smoking cessation 02/2014 - 06/26/2014 Right middle lobe 1.0 cm pulmonary nodule is similar over the past 3  months. This remains indeterminate.   - CT 12/25/14  Pulmonary nodules are st  . Nausea & vomiting 07/11/2018  . Normal cardiac stress test    pt can't remember when or where  . Obesity, unspecified 12/27/2012  . Osteoarthritis of right knee 08/05/2012  . 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 without organ or systems involvement ([place])   . Rheumatoid arthritis(714.0) 09/28/2012  . Sepsis ([place]) 03/16/2014  . Sinus pause 05/17/2019   Sinus pauses  . SIRS (systemic inflammatory response  syndrome) ([place]) 07/11/2018  . Syncope and collapse 03/03/2019  . 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 Thyroid nodule 03/05/2016   See CT chest 02/25/16 - u/s thyroid 03/04/16 > Pos L nodules > both Bx [id number] > BENIGN FOLLICULAR NODULE  . Unspecified essential hypertension 05/04/2013      Surgical History:  Past Surgical History:  Procedure Laterality Date  . ABDOMINAL HYSTERECTOMY    . CHOLECYSTECTOMY N/A 05/04/2013   Procedure: LAPAROSCOPIC CHOLECYSTECTOMY WITH INTRAOPERATIVE CHOLANGIOGRAM;  Surgeon: Haywood Lasso, MD;  Location: [facility];  Service: General;  Laterality: N/A;  . ERCP N/A 05/02/2013   Procedure: ENDOSCOPIC RETROGRADE CHOLANGIOPANCREATOGRAPHY (ERCP);  Surgeon: Beryle Beams, MD;  Location: [facility];  Service: Endoscopy;  Laterality: N/A;  . JOINT REPLACEMENT     left knee  . NASAL SINUS SURGERY    . SPHINCTEROTOMY  05/02/2013   Procedure: SPHINCTEROTOMY;  Surgeon: Beryle Beams, MD;  Location: [facility];  Service: Endoscopy;;  . TONSILLECTOMY    . TOTAL KNEE ARTHROPLASTY  08/01/2012   Procedure: TOTAL KNEE ARTHROPLASTY;  Surgeon: Meredith Pel, MD;  Location: [place];  Service: Orthopedics;  Laterality: Right;  Right total knee arthroplasty  . TOTAL KNEE ARTHROPLASTY  08/01/2012   RIGHT  KNEE     Home Meds: Current Meds  Medication Sig  . amoxicillin (AMOXIL) 500 MG tablet Take 2g 1 hour prior to dental procedure  . apixaban (ELIQUIS) 5 MG TABS tablet Take 1 tablet (5 mg total) by mouth 2 (two) times daily.  Marland Kitchen atorvastatin (LIPITOR) 10 MG tablet TAKE 1 TABLET BY MOUTH EVERY DAY  . Calcium Carbonate-Vitamin D (CALCIUM 600 + D PO) Take 1 tablet by mouth 2 (two) times daily.  . diclofenac sodium (VOLTAREN) 1 % GEL Apply 3 g topically daily as needed (to painful sites (in 3 large joints)).   Marland Kitchen diltiazem (CARDIZEM CD) 360 MG 24 hr capsule TAKE 1 CAPSULE BY MOUTH EVERY DAY  . estradiol (ESTRACE) 0.5 MG tablet Take 0.5 mg by mouth 2 (two) times a  day.  . etanercept (ENBREL SURECLICK) 50 MG/ML injection INJECT 50 MG (0.98 ML) UNDER THE SKIN ONCE A WEEK  . fish oil-omega-3 fatty acids 1000 MG capsule Take 1 g by mouth at bedtime.   . folic acid (FOLVITE) 1 MG tablet TAKE 1 TABLET BY MOUTH TWICE A DAY  . leflunomide (ARAVA) 10 MG tablet TAKE 1 TABLET BY MOUTH EVERY DAY  . levothyroxine (SYNTHROID) 50 MCG tablet Take 1 tablet (50 mcg total) by mouth daily before breakfast.  . metFORMIN (GLUCOPHAGE) 500 MG tablet Take 1 tablet (500 mg total) by mouth daily with breakfast.  . methocarbamol (ROBAXIN) 500 MG tablet Take 1 tablet (500 mg total) by mouth daily as needed for muscle spasms.  . metoprolol tartrate (LOPRESSOR) 50 MG tablet TAKE 1 TABLET BY MOUTH TWICE A DAY  . Multiple Vitamins-Minerals (MULTIVITAMINS THER. W/MINERALS) TABS Take 1 tablet by mouth daily.  . pantoprazole (  PROTONIX) 40 MG tablet Take 1 tablet (40 mg total) by mouth daily.  . [DISCONTINUED] amoxicillin-clavulanate (AUGMENTIN) 875-125 MG tablet Take 1 tablet by mouth 2 (two) times daily.    Allergies:  Allergies  Allergen Reactions  . Magnesium-Containing Compounds Nausea Only and Other (See Comments)    Re: Mag-Ox at higher doses caused a lot of stomach upset (dose had to be decreased)  . Monopril [Fosinopril] Cough  . Norvasc [Amlodipine Besylate] Other (See Comments)    10 mg dose caused LE edema- can tolerate 5 mg dose    Social History   Socioeconomic History  . Marital status: Divorced    Spouse name: Not on file  . Number of children: Not on file  . Years of education: Not on file  . Highest education level: Not on file  Occupational History  . Not on file  Social Needs  . Financial resource strain: Not on file  . Food insecurity    Worry: Not on file    Inability: Not on file  . Transportation needs    Medical: Not on file    Non-medical: Not on file  Tobacco Use  . Smoking status: Former Smoker    Packs/day: 0.25    Years: 2.00    Pack  years: 0.50    Types: Cigarettes  . Smokeless tobacco: Never Used  Substance and Sexual Activity  . Alcohol use: Yes    Comment: occasional  . Drug use: Never  . Sexual activity: Never  Lifestyle  . Physical activity    Days per week: Not on file    Minutes per session: Not on file  . Stress: Not on file  Relationships  . Social Herbalist on phone: Not on file    Gets together: Not on file    Attends religious service: Not on file    Active member of club or organization: Not on file    Attends meetings of clubs or organizations: Not on file    Relationship status: Not on file  . Intimate partner violence    Fear of current or ex partner: Not on file    Emotionally abused: Not on file    Physically abused: Not on file    Forced sexual activity: Not on file  Other Topics Concern  . Not on file  Social History Narrative  . Not on file     Family History  Problem Relation Age of Onset  . Cancer Mother        breast  . Heart attack Mother   . Stroke Father   . Diabetes Sister   . Diabetes Daughter      ROS:  Please see the history of present illness.     All other systems reviewed and negative.    Physical Exam: Blood pressure (!) 130/96, pulse 81, height 4\' 10"  (1.473 m), weight 180 lb (81.6 kg), SpO2 96 %. General: Well developed, well nourished female in no acute distress. Head: Normocephalic, atraumatic, sclera non-icteric, no xanthomas, nares are without discharge. EENT: normal  Lymph Nodes:  none Neck: Negative for carotid bruits. JVD 7-8 cm; positive HJR  back:without scoliosis kyphosis Lungs: Clear bilaterally to auscultation without wheezes, rales, or rhonchi. Breathing is unlabored. Heart: RRR with S1 S2 murmur . No rubs, or gallops appreciated. Abdomen: Soft, non-tender, non-distended with normoactive bowel sounds. No hepatomegaly. No rebound/guarding. No obvious abdominal masses. Msk:  Strength and tone appear normal for age. Extremities: No  clubbing  or cyanosis.  1-2+  edema.  Distal pedal pulses are 2+ and equal bilaterally. Skin: Warm and Dry Neuro: Alert and oriented X 3. CN III-XII intact Grossly normal sensory and motor function . Psych:  Responds to questions appropriately with a normal affect.      Labs: Cardiac Enzymes No results for input(s): CKTOTAL, CKMB, TROPONINI in the last 72 hours. CBC Lab Results  Component Value Date   WBC 8.2 03/28/2019   HGB 13.3 03/28/2019   HCT 40.3 03/28/2019   MCV 96.4 03/28/2019   PLT 228 03/28/2019   PROTIME: No results for input(s): LABPROT, INR in the last 72 hours. Chemistry No results for input(s): NA, K, CL, CO2, BUN, CREATININE, CALCIUM, PROT, BILITOT, ALKPHOS, ALT, AST, GLUCOSE in the last 168 hours.  Invalid input(s): LABALBU Lipids Lab Results  Component Value Date   CHOL 171 03/28/2019   HDL 70 03/28/2019   LDLCALC 80 03/28/2019   TRIG 113 03/28/2019   BNP No results found for: PROBNP Thyroid Function Tests: No results for input(s): TSH, T4TOTAL, T3FREE, THYROIDAB in the last 72 hours.  Invalid input(s): FREET3 Miscellaneous No results found for: DDIMER  Radiology/Studies:  No results found.  EKG: 03/03/19 11: 13: 17 atrial fibrillation at 184. ECG 04/04/2019 sinus rhythm at 62 Interval 16/08/42  Event Recorder personnally reviewed recurrent nonsustained atrial tachycardia up to 15 seconds as fast as 152 bpm.  She also had one episode of nonsustained ventricular tachycardia at 160 bpm for 21 beats. Pauses were noted of up to 3.8 seconds; these were not post termination.  She also had sinus bradycardia in the 40s during waking hours.  Assessment and Plan:  Atrial fibrillation with a rapid ventricular response  Sinus node dysfunction with pauses of greater than 3 seconds  Syncope  Sleep disordered breathing  HFpEF  Obesity   The patient has atrial fibrillation.  This was identified on presentation 7/20.  She has had similar  tachypalpitations.  Long-term anticoagulation is indicated.  We have reviewed risks and benefits.    She also has rapid atrial fibrillation.  This is required beta-blockers and calcium blocker for rate control and this is resulted in symptomatic sinus node dysfunction even at the same time as she has inadequately controlled SVT.  Hence, pacing is indicated for the relief of symptoms and for the opportunity to treat her tachycardia without further aggravating her propensity to bradycardia as suggested both by monitoring as well as her presyncope.  Her fall where she broke her kneecap may well have been a syncopal event; she does not recall landing.  She is volume overloaded.  Her weight is up 15 pounds in the last 6 weeks.  We will begin her on short-term intermittent diuretics.  She may well need long-term diuretics.  It is possible that her calcium channel blocker is contributing to her symptoms.  The benefits and risks were reviewed including but not limited to death,  perforation, infection, lead dislodgement and device malfunction.  The patient understands agrees and is willing to proceed.  She lives with her grandson and granddaughter.  She will ask them about her tendency to snore.  I suspect she has sleep apnea.  Would have a very low threshold for undertaking sleep study.     Virl Axe

## 2019-06-06 NOTE — Progress Notes (Signed)
ELECTROPHYSIOLOGY CONSULT NOTE  Patient ID: [name], MRN: [id number], DOB/AGE: Apr 24, 1946 73 y.o. Admit date: (Not on file) Date of Consult: 06/06/2019  Primary Physician: Alycia Rossetti, MD Primary Cardiologist: Kennedy Bucker is a 73 y.o. 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    HPI [name] is a 73 y.o. female was seen for atrial fibrillation and bradycardia arrhythmia.  She was admitted to hospital 7/20.  She was found to be in atrial fibrillation with rapid ventricular response.  Anticoagulation was initiated she was discharged in atrial fibrillation and on office follow-up she was noted to be in sinus rhythm.  She looks back and has noted recurrent tachypalpitations similar to her atrial fibrillation presentation.  She presented to the hospital back today because she had fallen.  It was presumed that she was dehydrated.  She remembers falling she does not remember landing.  She has had recurrent presyncope duration 2-3 seconds.  Also complains of exercise intolerance.  This is described as a heaviness sensation in her chest without radiation; it is almost always accompanied by palpitations and lightheadedness.  None of the symptoms occur at rest.  Thromboembolic risk factors ( age  -2, HTN-1, DM-1, Gender-1) for a CHADSVASc Score of 5    DATE TEST EF   7/20 Echo   60-65 % LAE (4.4/--/38)  8/20 MYOVIEW   64 % No ischemia        Sleep disordered breathing.  Daytime somnolence.  Date Cr K Hgb  8/20 0.9 3.6 13.3     Past Medical History:  Diagnosis Date  . Abrasion of skin with infection 05/09/2013  . Acute blood loss anemia 08/05/2012  . Anemia 11/21/2013  . Arthritis    osteoarthritis  . Atrial fibrillation (HCC)    Paroxysmal atrial fibrillation  . Bursitis of right shoulder   . Chronic cholecystitis with calculus 05/04/2013  . Diabetes mellitus without complication (HCC)    Type  2 NIDDM x 15 years  . Diabetes type 2, controlled ([place]) 05/04/2013  . Essential hypertension, benign 10/14/2012   Essential hypertension  . Fatty liver disease, nonalcoholic [id number]   hospitalized, MRCP dx fatty liver  . Gastroenteritis 10/16/2013  . GERD (gastroesophageal reflux disease)   . History of hypertension 08/20/2016  . History of total bilateral knee replacement 06/22/2017   Right= 2013. Left=2003  . Hormone replacement therapy (postmenopausal)   . 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 Hyperlipidemia   . Hypertension   . Hypertensive urgency 07/11/2018  . Hypokalemia 07/11/2018  . Hypomagnesemia 07/21/2016  . Hypothyroidism 03/28/2019  . Malnutrition of moderate degree ([place]) 03/18/2014  . Morbid obesity due to excess calories ([place]) 12/27/2012   Body mass index is 37.16 kg/m.  -  trending up  Lab Results Component Value Date  TSH 1.520 10/15/2013    Contributing to gerd risk/ doe/reviewed the need and the process to achieve and maintain neg calorie balance > defer f/u primary care including intermittently monitoring thyroid status    . Multiple pulmonary nodules 04/04/2014   Followed in Pulmonary clinic/ [facility]/ [place] - See CT chest 03/17/14 > not viz on abd CT from 10/16/13 as did not go high enough on lung slices >>>in tickle file for recall 06/17/14  - Spirometry 04/04/14 wnl sp smoking cessation 02/2014 - 06/26/2014 Right middle lobe 1.0 cm pulmonary nodule is similar over the past 3  months. This remains indeterminate.   - CT 12/25/14  Pulmonary nodules are st  . Nausea & vomiting 07/11/2018  . Normal cardiac stress test    pt can't remember when or where  . Obesity, unspecified 12/27/2012  . Osteoarthritis of right knee 08/05/2012  . 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 without organ or systems involvement ([place])   . Rheumatoid arthritis(714.0) 09/28/2012  . Sepsis ([place]) 03/16/2014  . Sinus pause 05/17/2019   Sinus pauses  . SIRS (systemic inflammatory response  syndrome) ([place]) 07/11/2018  . Syncope and collapse 03/03/2019  . 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 Thyroid nodule 03/05/2016   See CT chest 02/25/16 - u/s thyroid 03/04/16 > Pos L nodules > both Bx [id number] > BENIGN FOLLICULAR NODULE  . Unspecified essential hypertension 05/04/2013      Surgical History:  Past Surgical History:  Procedure Laterality Date  . ABDOMINAL HYSTERECTOMY    . CHOLECYSTECTOMY N/A 05/04/2013   Procedure: LAPAROSCOPIC CHOLECYSTECTOMY WITH INTRAOPERATIVE CHOLANGIOGRAM;  Surgeon: Haywood Lasso, MD;  Location: [facility];  Service: General;  Laterality: N/A;  . ERCP N/A 05/02/2013   Procedure: ENDOSCOPIC RETROGRADE CHOLANGIOPANCREATOGRAPHY (ERCP);  Surgeon: Beryle Beams, MD;  Location: [facility];  Service: Endoscopy;  Laterality: N/A;  . JOINT REPLACEMENT     left knee  . NASAL SINUS SURGERY    . SPHINCTEROTOMY  05/02/2013   Procedure: SPHINCTEROTOMY;  Surgeon: Beryle Beams, MD;  Location: [facility];  Service: Endoscopy;;  . TONSILLECTOMY    . TOTAL KNEE ARTHROPLASTY  08/01/2012   Procedure: TOTAL KNEE ARTHROPLASTY;  Surgeon: Meredith Pel, MD;  Location: [facility];  Service: Orthopedics;  Laterality: Right;  Right total knee arthroplasty  . TOTAL KNEE ARTHROPLASTY  08/01/2012   RIGHT  KNEE     Home Meds: Current Meds  Medication Sig  . amoxicillin (AMOXIL) 500 MG tablet Take 2g 1 hour prior to dental procedure  . apixaban (ELIQUIS) 5 MG TABS tablet Take 1 tablet (5 mg total) by mouth 2 (two) times daily.  Marland Kitchen atorvastatin (LIPITOR) 10 MG tablet TAKE 1 TABLET BY MOUTH EVERY DAY  . Calcium Carbonate-Vitamin D (CALCIUM 600 + D PO) Take 1 tablet by mouth 2 (two) times daily.  . diclofenac sodium (VOLTAREN) 1 % GEL Apply 3 g topically daily as needed (to painful sites (in 3 large joints)).   Marland Kitchen diltiazem (CARDIZEM CD) 360 MG 24 hr capsule TAKE 1 CAPSULE BY MOUTH EVERY DAY  . estradiol (ESTRACE) 0.5 MG tablet Take 0.5 mg by mouth 2 (two) times a  day.  . etanercept (ENBREL SURECLICK) 50 MG/ML injection INJECT 50 MG (0.98 ML) UNDER THE SKIN ONCE A WEEK  . fish oil-omega-3 fatty acids 1000 MG capsule Take 1 g by mouth at bedtime.   . folic acid (FOLVITE) 1 MG tablet TAKE 1 TABLET BY MOUTH TWICE A DAY  . leflunomide (ARAVA) 10 MG tablet TAKE 1 TABLET BY MOUTH EVERY DAY  . levothyroxine (SYNTHROID) 50 MCG tablet Take 1 tablet (50 mcg total) by mouth daily before breakfast.  . metFORMIN (GLUCOPHAGE) 500 MG tablet Take 1 tablet (500 mg total) by mouth daily with breakfast.  . methocarbamol (ROBAXIN) 500 MG tablet Take 1 tablet (500 mg total) by mouth daily as needed for muscle spasms.  . metoprolol tartrate (LOPRESSOR) 50 MG tablet TAKE 1 TABLET BY MOUTH TWICE A DAY  . Multiple Vitamins-Minerals (MULTIVITAMINS THER. W/MINERALS) TABS Take 1 tablet by mouth daily.  . pantoprazole (  PROTONIX) 40 MG tablet Take 1 tablet (40 mg total) by mouth daily.  . [DISCONTINUED] amoxicillin-clavulanate (AUGMENTIN) 875-125 MG tablet Take 1 tablet by mouth 2 (two) times daily.    Allergies:  Allergies  Allergen Reactions  . Magnesium-Containing Compounds Nausea Only and Other (See Comments)    Re: Mag-Ox at higher doses caused a lot of stomach upset (dose had to be decreased)  . Monopril [Fosinopril] Cough  . Norvasc [Amlodipine Besylate] Other (See Comments)    10 mg dose caused LE edema- can tolerate 5 mg dose    Social History   Socioeconomic History  . Marital status: Divorced    Spouse name: Not on file  . Number of children: Not on file  . Years of education: Not on file  . Highest education level: Not on file  Occupational History  . Not on file  Social Needs  . Financial resource strain: Not on file  . Food insecurity    Worry: Not on file    Inability: Not on file  . Transportation needs    Medical: Not on file    Non-medical: Not on file  Tobacco Use  . Smoking status: Former Smoker    Packs/day: 0.25    Years: 2.00    Pack  years: 0.50    Types: Cigarettes  . Smokeless tobacco: Never Used  Substance and Sexual Activity  . Alcohol use: Yes    Comment: occasional  . Drug use: Never  . Sexual activity: Never  Lifestyle  . Physical activity    Days per week: Not on file    Minutes per session: Not on file  . Stress: Not on file  Relationships  . Social Herbalist on phone: Not on file    Gets together: Not on file    Attends religious service: Not on file    Active member of club or organization: Not on file    Attends meetings of clubs or organizations: Not on file    Relationship status: Not on file  . Intimate partner violence    Fear of current or ex partner: Not on file    Emotionally abused: Not on file    Physically abused: Not on file    Forced sexual activity: Not on file  Other Topics Concern  . Not on file  Social History Narrative  . Not on file     Family History  Problem Relation Age of Onset  . Cancer Mother        breast  . Heart attack Mother   . Stroke Father   . Diabetes Sister   . Diabetes Daughter      ROS:  Please see the history of present illness.     All other systems reviewed and negative.    Physical Exam: Blood pressure (!) 130/96, pulse 81, height 4\' 10"  (1.473 m), weight 180 lb (81.6 kg), SpO2 96 %. General: Well developed, well nourished female in no acute distress. Head: Normocephalic, atraumatic, sclera non-icteric, no xanthomas, nares are without discharge. EENT: normal  Lymph Nodes:  none Neck: Negative for carotid bruits. JVD 7-8 cm; positive HJR  back:without scoliosis kyphosis Lungs: Clear bilaterally to auscultation without wheezes, rales, or rhonchi. Breathing is unlabored. Heart: RRR with S1 S2 murmur . No rubs, or gallops appreciated. Abdomen: Soft, non-tender, non-distended with normoactive bowel sounds. No hepatomegaly. No rebound/guarding. No obvious abdominal masses. Msk:  Strength and tone appear normal for age. Extremities: No  clubbing  or cyanosis.  1-2+  edema.  Distal pedal pulses are 2+ and equal bilaterally. Skin: Warm and Dry Neuro: Alert and oriented X 3. CN III-XII intact Grossly normal sensory and motor function . Psych:  Responds to questions appropriately with a normal affect.      Labs: Cardiac Enzymes No results for input(s): CKTOTAL, CKMB, TROPONINI in the last 72 hours. CBC Lab Results  Component Value Date   WBC 8.2 03/28/2019   HGB 13.3 03/28/2019   HCT 40.3 03/28/2019   MCV 96.4 03/28/2019   PLT 228 03/28/2019   PROTIME: No results for input(s): LABPROT, INR in the last 72 hours. Chemistry No results for input(s): NA, K, CL, CO2, BUN, CREATININE, CALCIUM, PROT, BILITOT, ALKPHOS, ALT, AST, GLUCOSE in the last 168 hours.  Invalid input(s): LABALBU Lipids Lab Results  Component Value Date   CHOL 171 03/28/2019   HDL 70 03/28/2019   LDLCALC 80 03/28/2019   TRIG 113 03/28/2019   BNP No results found for: PROBNP Thyroid Function Tests: No results for input(s): TSH, T4TOTAL, T3FREE, THYROIDAB in the last 72 hours.  Invalid input(s): FREET3 Miscellaneous No results found for: DDIMER  Radiology/Studies:  No results found.  EKG: 03/03/19 11: 13: 17 atrial fibrillation at 184. ECG 04/04/2019 sinus rhythm at 62 Interval 16/08/42  Event Recorder personnally reviewed recurrent nonsustained atrial tachycardia up to 15 seconds as fast as 152 bpm.  She also had one episode of nonsustained ventricular tachycardia at 160 bpm for 21 beats. Pauses were noted of up to 3.8 seconds; these were not post termination.  She also had sinus bradycardia in the 40s during waking hours.  Assessment and Plan:  Atrial fibrillation with a rapid ventricular response  Sinus node dysfunction with pauses of greater than 3 seconds  Syncope  Sleep disordered breathing  HFpEF  Obesity   The patient has atrial fibrillation.  This was identified on presentation 7/20.  She has had similar  tachypalpitations.  Long-term anticoagulation is indicated.  We have reviewed risks and benefits.    She also has rapid atrial fibrillation.  This is required beta-blockers and calcium blocker for rate control and this is resulted in symptomatic sinus node dysfunction even at the same time as she has inadequately controlled SVT.  Hence, pacing is indicated for the relief of symptoms and for the opportunity to treat her tachycardia without further aggravating her propensity to bradycardia as suggested both by monitoring as well as her presyncope.  Her fall where she broke her kneecap may well have been a syncopal event; she does not recall landing.  She is volume overloaded.  Her weight is up 15 pounds in the last 6 weeks.  We will begin her on short-term intermittent diuretics.  She may well need long-term diuretics.  It is possible that her calcium channel blocker is contributing to her symptoms.  The benefits and risks were reviewed including but not limited to death,  perforation, infection, lead dislodgement and device malfunction.  The patient understands agrees and is willing to proceed.  She lives with her grandson and granddaughter.  She will ask them about her tendency to snore.  I suspect she has sleep apnea.  Would have a very low threshold for undertaking sleep study.     Virl Axe

## 2019-06-07 ENCOUNTER — Other Ambulatory Visit: Payer: Self-pay | Admitting: *Deleted

## 2019-06-07 MED ORDER — ENBREL SURECLICK 50 MG/ML ~~LOC~~ SOAJ: SUBCUTANEOUS | 0 refills | Status: DC

## 2019-06-07 NOTE — Telephone Encounter (Signed)
Refill request received via fax  Last Visit: 02/28/19 Next Visit: due in December 2020. Message sent to the front to schedule patient  Labs: 03/28/19 glucose 121, Absolute Monocytes 1,058 TB gold: 05/11/19   Okay to refill per Dr. Estanislado Pandy

## 2019-06-07 NOTE — Telephone Encounter (Signed)
Please schedule patient for a follow up visit. Patient is due December 2020. Thanks!

## 2019-06-08 ENCOUNTER — Telehealth: Payer: Self-pay | Admitting: Internal Medicine

## 2019-06-08 ENCOUNTER — Other Ambulatory Visit (HOSPITAL_COMMUNITY)
Admission: RE | Admit: 2019-06-08 | Discharge: 2019-06-08 | Disposition: A | Discharge status: Home / Self Care | Payer: PPO | Source: Ambulatory Visit | Attending: Internal Medicine | Admitting: Internal Medicine

## 2019-06-08 DIAGNOSIS — Z20828 Contact with and (suspected) exposure to other viral communicable diseases: Secondary | ICD-10-CM | POA: Insufficient documentation

## 2019-06-08 DIAGNOSIS — Z01812 Encounter for preprocedural laboratory examination: Secondary | ICD-10-CM | POA: Diagnosis not present

## 2019-06-08 DIAGNOSIS — R001 Bradycardia, unspecified: Secondary | ICD-10-CM | POA: Diagnosis not present

## 2019-06-08 NOTE — Telephone Encounter (Signed)
° °  Patient was told to find out about snoring Patient calling to report "she does not snore" according to family.

## 2019-06-08 NOTE — Telephone Encounter (Addendum)
Pt was told to call back after her last visit with Dr. Caryl Comes to report if her family is noticing that she snores to determine if the pt is having potential sleep apnea issues... but she wants Dr. Caryl Comes to know that her family has not noticed that she snores at all.

## 2019-06-09 LAB — NOVEL CORONAVIRUS, NAA (HOSP ORDER, SEND-OUT TO REF LAB; TAT 18-24 HRS): SARS-CoV-2, NAA: NOT DETECTED

## 2019-06-11 ENCOUNTER — Ambulatory Visit (HOSPITAL_COMMUNITY)
Admission: RE | Admit: 2019-06-11 | Discharge: 2019-06-12 | Disposition: A | Discharge status: Home / Self Care | Payer: PPO | Attending: Internal Medicine | Admitting: Internal Medicine

## 2019-06-11 ENCOUNTER — Encounter (HOSPITAL_COMMUNITY): Payer: Self-pay | Admitting: General Practice

## 2019-06-11 ENCOUNTER — Other Ambulatory Visit (HOSPITAL_COMMUNITY): Payer: PPO

## 2019-06-11 ENCOUNTER — Ambulatory Visit (HOSPITAL_COMMUNITY)
Admission: RE | Disposition: A | Discharge status: Home / Self Care | Payer: Self-pay | Source: Home / Self Care | Attending: Internal Medicine

## 2019-06-11 ENCOUNTER — Other Ambulatory Visit: Payer: Self-pay

## 2019-06-11 DIAGNOSIS — E119 Type 2 diabetes mellitus without complications: Secondary | ICD-10-CM | POA: Diagnosis not present

## 2019-06-11 DIAGNOSIS — I110 Hypertensive heart disease with heart failure: Secondary | ICD-10-CM | POA: Insufficient documentation

## 2019-06-11 DIAGNOSIS — K219 Gastro-esophageal reflux disease without esophagitis: Secondary | ICD-10-CM | POA: Insufficient documentation

## 2019-06-11 DIAGNOSIS — Z79899 Other long term (current) drug therapy: Secondary | ICD-10-CM | POA: Diagnosis not present

## 2019-06-11 DIAGNOSIS — K760 Fatty (change of) liver, not elsewhere classified: Secondary | ICD-10-CM | POA: Insufficient documentation

## 2019-06-11 DIAGNOSIS — Z7901 Long term (current) use of anticoagulants: Secondary | ICD-10-CM | POA: Diagnosis not present

## 2019-06-11 DIAGNOSIS — I480 Paroxysmal atrial fibrillation: Secondary | ICD-10-CM | POA: Diagnosis not present

## 2019-06-11 DIAGNOSIS — Z8249 Family history of ischemic heart disease and other diseases of the circulatory system: Secondary | ICD-10-CM | POA: Diagnosis not present

## 2019-06-11 DIAGNOSIS — I5030 Unspecified diastolic (congestive) heart failure: Secondary | ICD-10-CM | POA: Insufficient documentation

## 2019-06-11 DIAGNOSIS — E785 Hyperlipidemia, unspecified: Secondary | ICD-10-CM | POA: Insufficient documentation

## 2019-06-11 DIAGNOSIS — M069 Rheumatoid arthritis, unspecified: Secondary | ICD-10-CM | POA: Insufficient documentation

## 2019-06-11 DIAGNOSIS — G4730 Sleep apnea, unspecified: Secondary | ICD-10-CM | POA: Insufficient documentation

## 2019-06-11 DIAGNOSIS — I495 Sick sinus syndrome: Secondary | ICD-10-CM | POA: Diagnosis present

## 2019-06-11 DIAGNOSIS — Z6837 Body mass index (BMI) 37.0-37.9, adult: Secondary | ICD-10-CM | POA: Insufficient documentation

## 2019-06-11 DIAGNOSIS — Z794 Long term (current) use of insulin: Secondary | ICD-10-CM | POA: Insufficient documentation

## 2019-06-11 DIAGNOSIS — R55 Syncope and collapse: Secondary | ICD-10-CM | POA: Diagnosis not present

## 2019-06-11 DIAGNOSIS — Z23 Encounter for immunization: Secondary | ICD-10-CM | POA: Insufficient documentation

## 2019-06-11 DIAGNOSIS — Z79890 Hormone replacement therapy (postmenopausal): Secondary | ICD-10-CM | POA: Diagnosis not present

## 2019-06-11 DIAGNOSIS — Z96653 Presence of artificial knee joint, bilateral: Secondary | ICD-10-CM | POA: Diagnosis not present

## 2019-06-11 DIAGNOSIS — Z833 Family history of diabetes mellitus: Secondary | ICD-10-CM | POA: Insufficient documentation

## 2019-06-11 DIAGNOSIS — Z87891 Personal history of nicotine dependence: Secondary | ICD-10-CM | POA: Insufficient documentation

## 2019-06-11 DIAGNOSIS — Z888 Allergy status to other drugs, medicaments and biological substances status: Secondary | ICD-10-CM | POA: Insufficient documentation

## 2019-06-11 DIAGNOSIS — E039 Hypothyroidism, unspecified: Secondary | ICD-10-CM | POA: Diagnosis not present

## 2019-06-11 DIAGNOSIS — Z959 Presence of cardiac and vascular implant and graft, unspecified: Secondary | ICD-10-CM

## 2019-06-11 HISTORY — PX: PACEMAKER IMPLANT: EP1218

## 2019-06-11 LAB — SURGICAL PCR SCREEN
MRSA, PCR: NEGATIVE
Staphylococcus aureus: NEGATIVE

## 2019-06-11 LAB — GLUCOSE, CAPILLARY
Glucose-Capillary: 160 mg/dL — ABNORMAL HIGH (ref 70–99)
Glucose-Capillary: 185 mg/dL — ABNORMAL HIGH (ref 70–99)

## 2019-06-11 SURGERY — PACEMAKER IMPLANT

## 2019-06-11 MED ORDER — LEFLUNOMIDE 20 MG PO TABS
10.0000 mg | ORAL_TABLET | Freq: Every day | ORAL | Status: DC
  Administered 2019-06-12: 10 mg via ORAL
  Filled 2019-06-11 (×2): qty 0.5

## 2019-06-11 MED ORDER — FOLIC ACID 1 MG PO TABS
1.0000 mg | ORAL_TABLET | Freq: Two times a day (BID) | ORAL | Status: DC
  Administered 2019-06-11 – 2019-06-12 (×2): 1 mg via ORAL
  Filled 2019-06-11 (×2): qty 1

## 2019-06-11 MED ORDER — ADULT MULTIVITAMIN W/MINERALS CH
1.0000 | ORAL_TABLET | Freq: Every day | ORAL | Status: DC
  Administered 2019-06-12: 1 via ORAL
  Filled 2019-06-11: qty 1

## 2019-06-11 MED ORDER — FENTANYL CITRATE (PF) 100 MCG/2ML IJ SOLN
INTRAMUSCULAR | Status: DC | PRN
  Administered 2019-06-11: 12.5 ug via INTRAVENOUS
  Administered 2019-06-11 (×3): 25 ug via INTRAVENOUS

## 2019-06-11 MED ORDER — ONDANSETRON HCL 4 MG/2ML IJ SOLN: 4.0000 mg | Freq: Four times a day (QID) | INTRAMUSCULAR | Status: DC | PRN

## 2019-06-11 MED ORDER — HEPARIN (PORCINE) IN NACL 1000-0.9 UT/500ML-% IV SOLN
INTRAVENOUS | Status: DC | PRN
  Administered 2019-06-11: 500 mL

## 2019-06-11 MED ORDER — MIDAZOLAM HCL 5 MG/5ML IJ SOLN
INTRAMUSCULAR | Status: AC
  Filled 2019-06-11: qty 5

## 2019-06-11 MED ORDER — MUPIROCIN 2 % EX OINT
TOPICAL_OINTMENT | CUTANEOUS | Status: AC
  Filled 2019-06-11: qty 22

## 2019-06-11 MED ORDER — LIDOCAINE HCL (PF) 1 % IJ SOLN
INTRAMUSCULAR | Status: DC | PRN
  Administered 2019-06-11: 30 mL

## 2019-06-11 MED ORDER — SODIUM CHLORIDE 0.9 % IV SOLN
INTRAVENOUS | Status: DC
  Administered 2019-06-11 (×2): via INTRAVENOUS

## 2019-06-11 MED ORDER — CEFAZOLIN SODIUM-DEXTROSE 2-4 GM/100ML-% IV SOLN
2.0000 g | INTRAVENOUS | Status: AC
  Administered 2019-06-11: 2 g via INTRAVENOUS

## 2019-06-11 MED ORDER — PANTOPRAZOLE SODIUM 40 MG PO TBEC
40.0000 mg | DELAYED_RELEASE_TABLET | Freq: Every day | ORAL | Status: DC
  Administered 2019-06-12: 40 mg via ORAL
  Filled 2019-06-11: qty 1

## 2019-06-11 MED ORDER — OMEGA-3-ACID ETHYL ESTERS 1 G PO CAPS
1.0000 g | ORAL_CAPSULE | Freq: Every day | ORAL | Status: DC
  Administered 2019-06-11: 1 g via ORAL
  Filled 2019-06-11: qty 1

## 2019-06-11 MED ORDER — MUPIROCIN 2 % EX OINT
1.0000 "application " | TOPICAL_OINTMENT | Freq: Once | CUTANEOUS | Status: AC
  Administered 2019-06-11: 1 via TOPICAL

## 2019-06-11 MED ORDER — HYDRALAZINE HCL 20 MG/ML IJ SOLN
INTRAMUSCULAR | Status: AC
  Filled 2019-06-11: qty 1

## 2019-06-11 MED ORDER — SODIUM CHLORIDE 0.9 % IV SOLN
INTRAVENOUS | Status: AC
  Filled 2019-06-11: qty 2

## 2019-06-11 MED ORDER — CEFAZOLIN SODIUM-DEXTROSE 2-4 GM/100ML-% IV SOLN
INTRAVENOUS | Status: AC
  Filled 2019-06-11: qty 100

## 2019-06-11 MED ORDER — METOPROLOL TARTRATE 50 MG PO TABS
50.0000 mg | ORAL_TABLET | Freq: Two times a day (BID) | ORAL | Status: DC
  Administered 2019-06-11: 50 mg via ORAL
  Filled 2019-06-11: qty 1

## 2019-06-11 MED ORDER — DILTIAZEM HCL ER COATED BEADS 240 MG PO CP24
240.0000 mg | ORAL_CAPSULE | Freq: Every day | ORAL | Status: DC
  Administered 2019-06-12: 240 mg via ORAL
  Filled 2019-06-11: qty 1

## 2019-06-11 MED ORDER — HYDRALAZINE HCL 20 MG/ML IJ SOLN
INTRAMUSCULAR | Status: DC | PRN
  Administered 2019-06-11 (×2): 10 mg via INTRAVENOUS

## 2019-06-11 MED ORDER — LEVOTHYROXINE SODIUM 50 MCG PO TABS
50.0000 ug | ORAL_TABLET | Freq: Every day | ORAL | Status: DC
  Administered 2019-06-12: 50 ug via ORAL
  Filled 2019-06-11: qty 1

## 2019-06-11 MED ORDER — SODIUM CHLORIDE 0.9 % IV SOLN
80.0000 mg | INTRAVENOUS | Status: AC
  Administered 2019-06-11: 80 mg

## 2019-06-11 MED ORDER — METHOCARBAMOL 500 MG PO TABS: 500.0000 mg | ORAL_TABLET | Freq: Every day | ORAL | Status: DC | PRN

## 2019-06-11 MED ORDER — FENTANYL CITRATE (PF) 100 MCG/2ML IJ SOLN
INTRAMUSCULAR | Status: AC
  Filled 2019-06-11: qty 2

## 2019-06-11 MED ORDER — METFORMIN HCL 500 MG PO TABS
500.0000 mg | ORAL_TABLET | Freq: Every day | ORAL | Status: DC
  Administered 2019-06-12: 500 mg via ORAL
  Filled 2019-06-11: qty 1

## 2019-06-11 MED ORDER — VITAMIN B-12 1000 MCG PO TABS
500.0000 ug | ORAL_TABLET | Freq: Every day | ORAL | Status: DC
  Administered 2019-06-12: 500 ug via ORAL
  Filled 2019-06-11: qty 1

## 2019-06-11 MED ORDER — ATORVASTATIN CALCIUM 10 MG PO TABS: 10.0000 mg | ORAL_TABLET | Freq: Every day | ORAL | Status: DC

## 2019-06-11 MED ORDER — LIDOCAINE HCL 1 % IJ SOLN
INTRAMUSCULAR | Status: AC
  Filled 2019-06-11: qty 60

## 2019-06-11 MED ORDER — INFLUENZA VAC A&B SA ADJ QUAD 0.5 ML IM PRSY
0.5000 mL | PREFILLED_SYRINGE | INTRAMUSCULAR | Status: AC
  Administered 2019-06-12: 0.5 mL via INTRAMUSCULAR
  Filled 2019-06-11: qty 0.5

## 2019-06-11 MED ORDER — CEFAZOLIN SODIUM-DEXTROSE 1-4 GM/50ML-% IV SOLN
1.0000 g | Freq: Four times a day (QID) | INTRAVENOUS | Status: AC
  Administered 2019-06-11 – 2019-06-12 (×3): 1 g via INTRAVENOUS
  Filled 2019-06-11 (×5): qty 50

## 2019-06-11 MED ORDER — SODIUM CHLORIDE 0.9 % IV SOLN: INTRAVENOUS | Status: DC

## 2019-06-11 MED ORDER — MIDAZOLAM HCL 5 MG/5ML IJ SOLN
INTRAMUSCULAR | Status: DC | PRN
  Administered 2019-06-11 (×4): 1 mg via INTRAVENOUS

## 2019-06-11 MED ORDER — ACETAMINOPHEN 325 MG PO TABS
325.0000 mg | ORAL_TABLET | ORAL | Status: DC | PRN
  Administered 2019-06-11 – 2019-06-12 (×2): 650 mg via ORAL
  Filled 2019-06-11 (×2): qty 2

## 2019-06-11 MED ORDER — METOPROLOL TARTRATE 25 MG PO TABS: 25.0000 mg | ORAL_TABLET | Freq: Two times a day (BID) | ORAL | Status: DC

## 2019-06-11 MED ORDER — HEPARIN (PORCINE) IN NACL 1000-0.9 UT/500ML-% IV SOLN
INTRAVENOUS | Status: AC
  Filled 2019-06-11: qty 500

## 2019-06-11 SURGICAL SUPPLY — 9 items
CABLE SURGICAL S-101-97-12 (CABLE) ×3 IMPLANT
IPG PACE AZUR XT DR MRI W1DR01 (Pacemaker) IMPLANT
KIT MICROPUNCTURE NIT STIFF (SHEATH) ×2 IMPLANT
LEAD CAPSURE NOVUS 45CM (Lead) ×2 IMPLANT
LEAD CAPSURE NOVUS 5076-52CM (Lead) ×2 IMPLANT
PACE AZURE XT DR MRI W1DR01 (Pacemaker) ×3 IMPLANT
PAD PRO RADIOLUCENT 2001M-C (PAD) ×3 IMPLANT
SHEATH 7FR PRELUDE SNAP 13 (SHEATH) ×4 IMPLANT
TRAY PACEMAKER INSERTION (PACKS) ×3 IMPLANT

## 2019-06-11 NOTE — Discharge Summary (Signed)
ELECTROPHYSIOLOGY PROCEDURE DISCHARGE SUMMARY    Patient ID: [name],  MRN: [id number], DOB/AGE: [birth year] 73 y.o.  Admit date: 06/11/2019 Discharge date: 06/12/2019  Primary Care Physician: Alycia Rossetti, MD  Primary Cardiologist: Dr. [name] Electrophysiologist: Dr. Caryl Comes  Primary Discharge Diagnosis:  1. Tachy-brady syndrome   Secondary Discharge Diagnosis:  1. HTN 2. DM 3. hypothyroidism 4. RA 5. Paroxysmal AFib     CHA2DS2BVasc is 4, on eliquis, appropriately dosed 6. HFpEF 7. Obesity     Allergies  Allergen Reactions  . Monopril [Fosinopril] Cough  . Magnesium-Containing Compounds Nausea Only and Other (See Comments)    Re: Mag-Ox at higher doses caused a lot of stomach upset (dose had to be decreased)  . Norvasc [Amlodipine Besylate] Other (See Comments)    10 mg dose caused LE edema- can tolerate 5 mg dose     Procedures This Admission:  1.  Implantation of a MDT dual chamber PPM on 06/11/2019 by Dr Caryl Comes.  The patient received a Medtronic MRI compatible  pulse generator serial number [id number], Medtronic MRI compatible 5076 ventricular lead serial number [id number] and a  Medtronic MRI compatible 5076 atrial lead serial number [id number]  There were no immediate post procedure complications. 2.  CXR on 06/12/2019 demonstrated no pneumothorax status post device implantation.   Brief HPI: [name] is a 73 y.o. female was referred to electrophysiology in the outpatient setting for consideration of PPM implantation.  Past medical history includes above.  Risks, benefits, and alternatives to PPM implantation were reviewed with the patient who wished to proceed.   Hospital Course:  The patient was admitted and underwent implantation of a PPM with details as outlined above.  She was monitored on telemetry overnight which demonstrated SR, infrequent PVCs, rare couplet, 3 beat NSVT.  Left chest was without hematoma or ecchymosis.  The device was  interrogated and found to be functioning normally.  CXR was obtained and demonstrated no pneumothorax status post device implantation.  Wound care, arm mobility, and restrictions were reviewed with the patient.  The patient feels well, no CP or SOB, she was examined by Dr. Caryl Comes and considered stable for discharge to home.    Physical Exam: Vitals:   06/12/19 0001 06/12/19 0510 06/12/19 0522 06/12/19 0615  BP:  (!) 174/81    Pulse:      Resp: 15 15    Temp: 98 F (36.7 C)  98.2 F (36.8 C)   TempSrc: Oral  Oral   SpO2: 93% 93%    Weight:    78.4 kg  Height:        GEN- The patient is well appearing, alert and oriented x 3 today.   HEENT: normocephalic, atraumatic; sclera clear, conjunctiva pink; hearing intact; oropharynx clear; neck supple, no JVP Lungs- CTA b/l, normal work of breathing.  No wheezes, rales, rhonchi Heart- RRR, no murmurs, rubs or gallops, PMI not laterally displaced GI- soft, non-tender, non-distended Extremities- no clubbing, cyanosis, or edema MS- no significant deformity or atrophy Skin- warm and dry, no rash or lesion,  left chest without hematoma/ecchymosis Psych- euthymic mood, full affect Neuro- no gross deficits   Labs:   Lab Results  Component Value Date   WBC 5.4 06/06/2019   HGB 13.3 06/06/2019   HCT 41.3 06/06/2019   MCV 96 06/06/2019   PLT 169 06/06/2019    Recent Labs  Lab 06/06/19 1159  NA 142  K 4.0  CL 104  CO2 23  BUN 22  CREATININE 0.84  CALCIUM 9.4  GLUCOSE 138*    Discharge Medications:  Allergies as of 06/12/2019      Reactions   Monopril [fosinopril] Cough   Magnesium-containing Compounds Nausea Only, Other (See Comments)   Re: Mag-Ox at higher doses caused a lot of stomach upset (dose had to be decreased)   Norvasc [amlodipine Besylate] Other (See Comments)   10 mg dose caused LE edema- can tolerate 5 mg dose      Medication List    STOP taking these medications   metoprolol tartrate 50 MG tablet Commonly  known as: LOPRESSOR     TAKE these medications   amoxicillin 500 MG tablet Commonly known as: AMOXIL Take 2g 1 hour prior to dental procedure   apixaban 5 MG Tabs tablet Commonly known as: ELIQUIS Take 1 tablet (5 mg total) by mouth 2 (two) times daily. Notes to patient: Do not resume until Thursday 07/15/2019 evening dose   atorvastatin 10 MG tablet Commonly known as: LIPITOR TAKE 1 TABLET BY MOUTH EVERY DAY   CALCIUM 600 + D PO Take 1 tablet by mouth 2 (two) times daily.   carvedilol 12.5 MG tablet Commonly known as: COREG Take 1 tablet (12.5 mg total) by mouth 2 (two) times daily with a meal.   diltiazem 360 MG 24 hr capsule Commonly known as: CARDIZEM CD TAKE 1 CAPSULE BY MOUTH EVERY DAY   Enbrel SureClick 50 MG/ML injection Generic drug: etanercept INJECT 50 MG (0.98 ML) UNDER THE SKIN ONCE A WEEK   estradiol 0.5 MG tablet Commonly known as: ESTRACE Take 0.5 mg by mouth 2 (two) times a day.   fish oil-omega-3 fatty acids 1000 MG capsule Take 1 g by mouth at bedtime.   folic acid 1 MG tablet Commonly known as: FOLVITE TAKE 1 TABLET BY MOUTH TWICE A DAY   furosemide 40 MG tablet Commonly known as: LASIX Take one tablet by mouth every other day for 7 days.   leflunomide 10 MG tablet Commonly known as: ARAVA TAKE 1 TABLET BY MOUTH EVERY DAY   levothyroxine 50 MCG tablet Commonly known as: SYNTHROID Take 1 tablet (50 mcg total) by mouth daily before breakfast.   metFORMIN 500 MG tablet Commonly known as: GLUCOPHAGE Take 1 tablet (500 mg total) by mouth daily with breakfast.   methocarbamol 500 MG tablet Commonly known as: ROBAXIN Take 1 tablet (500 mg total) by mouth daily as needed for muscle spasms.   multivitamins ther. w/minerals Tabs tablet Take 1 tablet by mouth daily.   pantoprazole 40 MG tablet Commonly known as: PROTONIX Take 1 tablet (40 mg total) by mouth daily.   vitamin B-12 500 MCG tablet Commonly known as: CYANOCOBALAMIN Take 500  mcg by mouth daily.   Voltaren 1 % Gel Generic drug: diclofenac sodium Apply 3 g topically daily as needed (to painful sites (in 3 large joints)).       Disposition:  Home  Discharge Instructions    Diet - low sodium heart healthy   Complete by: As directed    Increase activity slowly   Complete by: As directed      Follow-up Information    [facility] Follow up.   Specialty: Cardiology Why: 06/21/2019 @ 10:30AM, wound check visit Contact information: [address] [place]       Deboraha Sprang, MD Follow up.   Specialty: Cardiology Why: 09/11/2019 @ 3:00PM Contact information: [id number]  [address] [place] [address] [phone]           Duration of Discharge Encounter: Greater than 30 minutes including physician time.  Venetia Night, PA-C 06/12/2019 8:10 AM

## 2019-06-11 NOTE — Interval H&P Note (Signed)
History and Physical Interval Note:  06/11/2019 3:06 PM  [name]  has presented today for surgery, with the diagnosis of bradycardia.  The various methods of treatment have been discussed with the patient and family. After consideration of risks, benefits and other options for treatment, the patient has consented to  Procedure(s): PACEMAKER IMPLANT (N/A) as a surgical intervention.  The patient's history has been reviewed, patient examined, no change in status, stable for surgery.  I have reviewed the patient's chart and labs.  Questions were answered to the patient's satisfaction.     Virl Axe The benefits and risks were reviewed including but not limited to death,  perforation, infection, lead dislodgement and device malfunction.  The patient understands agrees and is willing to proceed.

## 2019-06-11 NOTE — Discharge Instructions (Signed)
° ° °  Supplemental Discharge Instructions for  Pacemaker/Defibrillator Patients  Activity No heavy lifting or vigorous activity with your left/right arm for 6 to 8 weeks.  Do not raise your left/right arm above your head for one week.  Gradually raise your affected arm as drawn below.             06/15/2019                06/16/2019               06/17/2019            06/18/2019 __  NO DRIVING for 1 week ; you may begin driving on   [id number]  .  WOUND CARE - Keep the wound area clean and dry.  Do not get this area wet, no showers for 24 hours; you may shower on 06/12/2019 evening  . - The tape/steri-strips on your wound will fall off; do not pull them off.  No bandage is needed on the site.  DO  NOT apply any creams, oils, or ointments to the wound area. - If you notice any drainage or discharge from the wound, any swelling or bruising at the site, or you develop a fever > 101? F after you are discharged home, call the office at once.  Special Instructions - You are still able to use cellular telephones; use the ear opposite the side where you have your pacemaker/defibrillator.  Avoid carrying your cellular phone near your device. - When traveling through airports, show security personnel your identification card to avoid being screened in the metal detectors.  Ask the security personnel to use the hand wand. - Avoid arc welding equipment, MRI testing (magnetic resonance imaging), TENS units (transcutaneous nerve stimulators).  Call the office for questions about other devices. - Avoid electrical appliances that are in poor condition or are not properly grounded. - Microwave ovens are safe to be near or to operate.

## 2019-06-11 NOTE — Progress Notes (Signed)
Pt arrived to unit from cath lab.

## 2019-06-11 NOTE — Progress Notes (Signed)
Orthopedic Tech Progress Note Patient Details:  [name] 02/02/1946 [id number] RN said patient has on sling Patient ID: [name], female   DOB: 06-Dec-1945, 73 y.o.   MRN: [id number]   [name] 06/11/2019, 6:36 PM

## 2019-06-12 ENCOUNTER — Ambulatory Visit (HOSPITAL_COMMUNITY): Payer: PPO

## 2019-06-12 ENCOUNTER — Encounter (HOSPITAL_COMMUNITY): Payer: Self-pay | Admitting: Internal Medicine

## 2019-06-12 DIAGNOSIS — E785 Hyperlipidemia, unspecified: Secondary | ICD-10-CM | POA: Diagnosis not present

## 2019-06-12 DIAGNOSIS — E119 Type 2 diabetes mellitus without complications: Secondary | ICD-10-CM | POA: Diagnosis not present

## 2019-06-12 DIAGNOSIS — J9811 Atelectasis: Secondary | ICD-10-CM | POA: Diagnosis not present

## 2019-06-12 DIAGNOSIS — Z950 Presence of cardiac pacemaker: Secondary | ICD-10-CM | POA: Diagnosis not present

## 2019-06-12 DIAGNOSIS — I5030 Unspecified diastolic (congestive) heart failure: Secondary | ICD-10-CM | POA: Diagnosis not present

## 2019-06-12 DIAGNOSIS — K760 Fatty (change of) liver, not elsewhere classified: Secondary | ICD-10-CM | POA: Diagnosis not present

## 2019-06-12 DIAGNOSIS — I110 Hypertensive heart disease with heart failure: Secondary | ICD-10-CM | POA: Diagnosis not present

## 2019-06-12 DIAGNOSIS — Z87891 Personal history of nicotine dependence: Secondary | ICD-10-CM | POA: Diagnosis not present

## 2019-06-12 DIAGNOSIS — G4730 Sleep apnea, unspecified: Secondary | ICD-10-CM | POA: Diagnosis not present

## 2019-06-12 DIAGNOSIS — E039 Hypothyroidism, unspecified: Secondary | ICD-10-CM | POA: Diagnosis not present

## 2019-06-12 DIAGNOSIS — Z23 Encounter for immunization: Secondary | ICD-10-CM | POA: Diagnosis not present

## 2019-06-12 DIAGNOSIS — I495 Sick sinus syndrome: Secondary | ICD-10-CM | POA: Diagnosis not present

## 2019-06-12 DIAGNOSIS — K219 Gastro-esophageal reflux disease without esophagitis: Secondary | ICD-10-CM | POA: Diagnosis not present

## 2019-06-12 LAB — GLUCOSE, CAPILLARY
Glucose-Capillary: 146 mg/dL — ABNORMAL HIGH (ref 70–99)
Glucose-Capillary: 211 mg/dL — ABNORMAL HIGH (ref 70–99)

## 2019-06-12 MED ORDER — CARVEDILOL 12.5 MG PO TABS
12.5000 mg | ORAL_TABLET | Freq: Two times a day (BID) | ORAL | Status: DC
  Administered 2019-06-12: 12.5 mg via ORAL
  Filled 2019-06-12: qty 1

## 2019-06-12 MED ORDER — CARVEDILOL 12.5 MG PO TABS: 12.5000 mg | ORAL_TABLET | Freq: Two times a day (BID) | ORAL | 6 refills | Status: DC

## 2019-06-12 MED FILL — Lidocaine HCl Local Inj 1%: INTRAMUSCULAR | Qty: 40 | Status: AC

## 2019-06-12 MED FILL — Lidocaine HCl Local Inj 1%: INTRAMUSCULAR | Qty: 20 | Status: AC

## 2019-06-21 ENCOUNTER — Other Ambulatory Visit: Payer: Self-pay | Admitting: Family Medicine

## 2019-06-21 ENCOUNTER — Other Ambulatory Visit: Payer: Self-pay

## 2019-06-21 ENCOUNTER — Ambulatory Visit (INDEPENDENT_AMBULATORY_CARE_PROVIDER_SITE_OTHER): Payer: PPO | Admitting: *Deleted

## 2019-06-21 DIAGNOSIS — R55 Syncope and collapse: Secondary | ICD-10-CM

## 2019-06-21 DIAGNOSIS — I455 Other specified heart block: Secondary | ICD-10-CM

## 2019-06-21 DIAGNOSIS — R5383 Other fatigue: Secondary | ICD-10-CM

## 2019-06-21 DIAGNOSIS — R946 Abnormal results of thyroid function studies: Secondary | ICD-10-CM

## 2019-06-21 LAB — CUP PACEART INCLINIC DEVICE CHECK
Battery Remaining Longevity: 179 mo
Battery Voltage: 3.14 V
Brady Statistic AP VP Percent: 0.01 %
Brady Statistic AP VS Percent: 2.17 %
Brady Statistic AS VP Percent: 0.04 %
Brady Statistic AS VS Percent: 97.78 %
Brady Statistic RA Percent Paced: 2.48 %
Brady Statistic RV Percent Paced: 0.05 %
Date Time Interrogation Session: 20201029110513
Implantable Lead Implant Date: 20201019
Implantable Lead Implant Date: 20201019
Implantable Lead Location: 753859
Implantable Lead Location: 753860
Implantable Lead Model: 5076
Implantable Lead Model: 5076
Implantable Pulse Generator Implant Date: 20201019
Lead Channel Impedance Value: 418 Ohm
Lead Channel Impedance Value: 437 Ohm
Lead Channel Impedance Value: 494 Ohm
Lead Channel Impedance Value: 532 Ohm
Lead Channel Pacing Threshold Amplitude: 0.5 V
Lead Channel Pacing Threshold Amplitude: 0.75 V
Lead Channel Pacing Threshold Pulse Width: 0.4 ms
Lead Channel Pacing Threshold Pulse Width: 0.4 ms
Lead Channel Sensing Intrinsic Amplitude: 16.5 mV
Lead Channel Sensing Intrinsic Amplitude: 4.25 mV
Lead Channel Setting Pacing Amplitude: 3.5 V
Lead Channel Setting Pacing Amplitude: 3.5 V
Lead Channel Setting Pacing Pulse Width: 0.4 ms
Lead Channel Setting Sensing Sensitivity: 1.2 mV

## 2019-06-21 NOTE — Progress Notes (Signed)
Wound check appointment.Dermabond removed. Wound without redness or edema. Incision edges approximated, wound well healed. Small red area next to incision after dermabond removed, no drainage.Normal device function. Thresholds, sensing, and impedances consistent with implant measurements. Device programmed at 3.5V/auto capture programmed on for extra safety margin until 3 month visit. Histogram distribution appropriate for patient and level of activity. No mode switches or high ventricular rates noted. Patient educated about wound care, arm mobility, lifting restrictions. ROV with Dr Caryl Comes on 09/11/19.Next remote scheduled for 12/12/19.

## 2019-06-21 NOTE — Patient Instructions (Signed)
Call office if you have any drainage, redness or swelling at incision site.

## 2019-06-27 ENCOUNTER — Other Ambulatory Visit: Payer: Self-pay | Admitting: Family Medicine

## 2019-07-03 ENCOUNTER — Encounter: Payer: PPO | Admitting: Family Medicine

## 2019-07-04 ENCOUNTER — Other Ambulatory Visit: Payer: Self-pay

## 2019-07-04 ENCOUNTER — Ambulatory Visit (INDEPENDENT_AMBULATORY_CARE_PROVIDER_SITE_OTHER): Payer: PPO | Admitting: Family Medicine

## 2019-07-04 ENCOUNTER — Encounter: Payer: Self-pay | Admitting: Family Medicine

## 2019-07-04 VITALS — BP 120/84 | HR 87 | Temp 98.8°F | Resp 18 | Ht <= 58 in | Wt 180.4 lb

## 2019-07-04 DIAGNOSIS — K760 Fatty (change of) liver, not elsewhere classified: Secondary | ICD-10-CM | POA: Diagnosis not present

## 2019-07-04 DIAGNOSIS — E669 Obesity, unspecified: Secondary | ICD-10-CM | POA: Insufficient documentation

## 2019-07-04 DIAGNOSIS — I10 Essential (primary) hypertension: Secondary | ICD-10-CM | POA: Diagnosis not present

## 2019-07-04 DIAGNOSIS — R635 Abnormal weight gain: Secondary | ICD-10-CM | POA: Diagnosis not present

## 2019-07-04 DIAGNOSIS — E119 Type 2 diabetes mellitus without complications: Secondary | ICD-10-CM

## 2019-07-04 DIAGNOSIS — E782 Mixed hyperlipidemia: Secondary | ICD-10-CM | POA: Diagnosis not present

## 2019-07-04 DIAGNOSIS — E039 Hypothyroidism, unspecified: Secondary | ICD-10-CM

## 2019-07-04 NOTE — Progress Notes (Signed)
Subjective:    Patient ID: [name], female    DOB: Jan 30, 1946, 73 y.o.   MRN: [id number]  Patient presents for Diabetes (follow up) and Hypertension (follow up)  Patient here to follow-up chronic medical problems.  Medications reviewed.  Her last visit was back in August.  Did see my partner back in September secondary to dental abscess. She is followed by multiple specialists.  She has multiple chronic medical problems including rheumatoid arthritis paroxysmal atrial fibrillation diabetes mellitus.  Diabetes mellitus she is currently on metformin 500 mg once a day her last A1c was 6.4%,    She has not checked her CBG.   Seen by [facility] recently -no issues Her weight is up 22lbs in the past 3 months  she is hungry all the time, she does eat late at times  eating lots of ice cream  She has not as active since pacemaker placed in Oct   Paroxysmal atrial fibrillation she is followed by cardiology she is currently on Eliquis and Lopressor/diltiazem  3 of anemia.  She is not sure why she was still on iron and B12.  I had her stop her iron tablets based on her last set of labs.  Her B12 was continued as it was at 602  Review Of Systems:  GEN- denies fatigue, fever, weight loss,weakness, recent illness HEENT- denies eye drainage, change in vision, nasal discharge, CVS- denies chest pain, palpitations RESP- denies SOB, cough, wheeze ABD- denies N/V, change in stools, abd pain GU- denies dysuria, hematuria, dribbling, incontinence MSK- denies joint pain, muscle aches, injury Neuro- denies headache, dizziness, syncope, seizure activity       Objective:    BP 120/84 (BP Location: Right Arm, Patient Position: Sitting, Cuff Size: Normal)   Pulse 87   Temp 98.8 F (37.1 C) (Oral)   Resp 18   Ht 4\' 10"  (1.473 m)   Wt 180 lb 6.4 oz (81.8 kg)   SpO2 94%   BMI 37.70 kg/m  GEN- NAD, alert and oriented x3 HEENT- PERRL, EOMI, non injected sclera, pink conjunctiva, MMM,  oropharynx clear Neck- Supple, no thyromegaly CVS- RRR, no murmur RESP-CTAB ABD-NABS,soft,NT,ND EXT- trace ankle edema bilat  Pulses- Radial, DP- 2+        Assessment & Plan:      Problem List Items Addressed This Visit      Unprioritized   Diabetes mellitus type 2, uncomplicated ([place])    Diabetes has been well controlled however I am concerned with her eating more and has been hungry all the time that this may not be the case.  We will check her A1c goal to keep it less than 7%.  Discussed cutting back on ice cream and late night eating.  She is also been cleared to walk for exercise.  I will also check her thyroid level to make sure that there is nothing metabolic causing her weight change.      Relevant Orders   CBC with Differential   Comprehensive metabolic panel   Hemoglobin A1c   Lipid Panel   HM Diabetes Foot Exam (Completed)   Essential hypertension, benign - Primary    Blood pressure is controlled.  No change in medication.      Fatty liver disease, nonalcoholic   Hyperlipidemia    Continues on Lipitor.  We will check liver function tests.      Hypomagnesemia    Recheck magnesium level.      Relevant Orders   Magnesium  Hypothyroidism   Relevant Orders   TSH   T3, Free   T4, Free   Obesity (BMI 30-39.9)    Other Visit Diagnoses    Weight gain       Per above, does not appear to be fluid overload, I think this is due to extra calories, decreased activity recently but will check thyroid as well      Note: This dictation was prepared with Dragon dictation along with smaller phrase technology. Any transcriptional errors that result from this process are unintentional.

## 2019-07-04 NOTE — Assessment & Plan Note (Signed)
Blood pressure is controlled. No change in medication 

## 2019-07-04 NOTE — Assessment & Plan Note (Signed)
- 

## 2019-07-04 NOTE — Assessment & Plan Note (Signed)
Diabetes has been well controlled however I am concerned with her eating more and has been hungry all the time that this may not be the case.  We will check her A1c goal to keep it less than 7%.  Discussed cutting back on ice cream and late night eating.  She is also been cleared to walk for exercise.  I will also check her thyroid level to make sure that there is nothing metabolic causing her weight change.

## 2019-07-04 NOTE — Assessment & Plan Note (Signed)
Continues on Lipitor.  We will check liver function tests.

## 2019-07-04 NOTE — Patient Instructions (Signed)
F/U 4 months for wellness visit  

## 2019-07-05 ENCOUNTER — Other Ambulatory Visit: Payer: Self-pay | Admitting: *Deleted

## 2019-07-05 LAB — COMPREHENSIVE METABOLIC PANEL
AG Ratio: 1.4 (calc) (ref 1.0–2.5)
ALT: 16 U/L (ref 6–29)
AST: 18 U/L (ref 10–35)
Albumin: 3.7 g/dL (ref 3.6–5.1)
Alkaline phosphatase (APISO): 79 U/L (ref 37–153)
BUN: 23 mg/dL (ref 7–25)
CO2: 28 mmol/L (ref 20–32)
Calcium: 9.5 mg/dL (ref 8.6–10.4)
Chloride: 103 mmol/L (ref 98–110)
Creat: 0.87 mg/dL (ref 0.60–0.93)
Globulin: 2.7 g/dL (calc) (ref 1.9–3.7)
Glucose, Bld: 216 mg/dL — ABNORMAL HIGH (ref 65–99)
Potassium: 3.7 mmol/L (ref 3.5–5.3)
Sodium: 141 mmol/L (ref 135–146)
Total Bilirubin: 0.3 mg/dL (ref 0.2–1.2)
Total Protein: 6.4 g/dL (ref 6.1–8.1)

## 2019-07-05 LAB — CBC WITH DIFFERENTIAL/PLATELET
Absolute Monocytes: 980 cells/uL — ABNORMAL HIGH (ref 200–950)
Basophils Absolute: 19 cells/uL (ref 0–200)
Basophils Relative: 0.3 %
Eosinophils Absolute: 372 cells/uL (ref 15–500)
Eosinophils Relative: 6 %
HCT: 40.3 % (ref 35.0–45.0)
Hemoglobin: 13.1 g/dL (ref 11.7–15.5)
Lymphs Abs: 1668 cells/uL (ref 850–3900)
MCH: 30.5 pg (ref 27.0–33.0)
MCHC: 32.5 g/dL (ref 32.0–36.0)
MCV: 93.9 fL (ref 80.0–100.0)
MPV: 12.3 fL (ref 7.5–12.5)
Monocytes Relative: 15.8 %
Neutro Abs: 3162 cells/uL (ref 1500–7800)
Neutrophils Relative %: 51 %
Platelets: 194 10*3/uL (ref 140–400)
RBC: 4.29 10*6/uL (ref 3.80–5.10)
RDW: 12.8 % (ref 11.0–15.0)
Total Lymphocyte: 26.9 %
WBC: 6.2 10*3/uL (ref 3.8–10.8)

## 2019-07-05 LAB — LIPID PANEL
Cholesterol: 189 mg/dL (ref <200)
HDL: 78 mg/dL (ref >=50)
LDL Cholesterol (Calc): 89 mg/dL (calc)
Non-HDL Cholesterol (Calc): 111 mg/dL (calc) (ref <130)
Total CHOL/HDL Ratio: 2.4 (calc) (ref <5.0)
Triglycerides: 127 mg/dL (ref <150)

## 2019-07-05 LAB — HEMOGLOBIN A1C
Hgb A1c MFr Bld: 6.5 % of total Hgb — ABNORMAL HIGH (ref <5.7)
Mean Plasma Glucose: 140 (calc)
eAG (mmol/L): 7.7 (calc)

## 2019-07-05 LAB — MAGNESIUM: Magnesium: 1 mg/dL — CL (ref 1.5–2.5)

## 2019-07-05 LAB — TSH: TSH: 4.03 mIU/L (ref 0.40–4.50)

## 2019-07-05 LAB — T3, FREE: T3, Free: 2.8 pg/mL (ref 2.3–4.2)

## 2019-07-05 LAB — T4, FREE: Free T4: 1 ng/dL (ref 0.8–1.8)

## 2019-07-05 MED ORDER — MAGNESIUM OXIDE 400 MG PO CAPS: 1.0000 | ORAL_CAPSULE | Freq: Every day | ORAL | 0 refills | Status: DC

## 2019-07-05 MED ORDER — MAGNESIUM OXIDE 400 MG PO CAPS: 1.0000 | ORAL_CAPSULE | Freq: Two times a day (BID) | ORAL | 0 refills | Status: DC

## 2019-07-06 LAB — HM DIABETES EYE EXAM

## 2019-07-13 ENCOUNTER — Other Ambulatory Visit: Payer: PPO

## 2019-07-14 LAB — MAGNESIUM: Magnesium: 1.2 mg/dL — ABNORMAL LOW (ref 1.5–2.5)

## 2019-07-16 ENCOUNTER — Telehealth: Payer: Self-pay | Admitting: Rheumatology

## 2019-07-16 MED ORDER — ENBREL SURECLICK 50 MG/ML ~~LOC~~ SOAJ: SUBCUTANEOUS | 2 refills | Status: DC

## 2019-07-16 NOTE — Telephone Encounter (Signed)
Sheree from [employer] left a voicemail stating patient's prescription expired 5 days ago and requesting a new prescription for the patient.  If you have any questions, please call back at [phone] or fax to [phone]

## 2019-07-16 NOTE — Telephone Encounter (Signed)
Advised patient that prescription has been sent today. Patient verbalized understanding.    Patient would like to start her application process for enbrel patient assistance. Advised patient I would send message to [name].

## 2019-07-16 NOTE — Telephone Encounter (Signed)
Patient left a voicemail stating [employer] doesn't have a current prescription on file for her Enbrel.

## 2019-07-16 NOTE — Telephone Encounter (Signed)
Last Visit: 02/28/2019 Next Visit: 08/01/2019 Labs: 07/04/2019 elevated glucose, absolute ,monocytes 980. TB Gold: 05/11/2019 negative   Okay to refill per Dr. Estanislado Pandy.

## 2019-07-17 NOTE — Telephone Encounter (Signed)
Called patient, she received [employer] renewal application in the mail and will complete and mail to the office to have the provider portion completed. She will call if she has any other questions.  10:18 AM [name], CPhT

## 2019-07-26 ENCOUNTER — Other Ambulatory Visit: Payer: Self-pay | Admitting: *Deleted

## 2019-07-26 NOTE — Telephone Encounter (Signed)
Attempted to renew patient's Enbrel Prior Authorization.Her current auth expires on 09/07/19. Will need to renew closer to that date. Will try on 08/07/19. And will fax current auth to [employer] for PAP application.   4:22 PM Beatriz Chancellor, CPhT

## 2019-07-30 ENCOUNTER — Telehealth: Payer: Self-pay | Admitting: Rheumatology

## 2019-07-30 ENCOUNTER — Encounter: Payer: Self-pay | Admitting: Rheumatology

## 2019-07-30 ENCOUNTER — Telehealth (INDEPENDENT_AMBULATORY_CARE_PROVIDER_SITE_OTHER): Payer: PPO | Admitting: Rheumatology

## 2019-07-30 ENCOUNTER — Other Ambulatory Visit: Payer: Self-pay

## 2019-07-30 DIAGNOSIS — K760 Fatty (change of) liver, not elsewhere classified: Secondary | ICD-10-CM

## 2019-07-30 DIAGNOSIS — Z8639 Personal history of other endocrine, nutritional and metabolic disease: Secondary | ICD-10-CM

## 2019-07-30 DIAGNOSIS — Z8679 Personal history of other diseases of the circulatory system: Secondary | ICD-10-CM

## 2019-07-30 DIAGNOSIS — R918 Other nonspecific abnormal finding of lung field: Secondary | ICD-10-CM

## 2019-07-30 DIAGNOSIS — I480 Paroxysmal atrial fibrillation: Secondary | ICD-10-CM

## 2019-07-30 DIAGNOSIS — Z8719 Personal history of other diseases of the digestive system: Secondary | ICD-10-CM

## 2019-07-30 DIAGNOSIS — M7062 Trochanteric bursitis, left hip: Secondary | ICD-10-CM

## 2019-07-30 DIAGNOSIS — M85851 Other specified disorders of bone density and structure, right thigh: Secondary | ICD-10-CM | POA: Diagnosis not present

## 2019-07-30 DIAGNOSIS — Z96653 Presence of artificial knee joint, bilateral: Secondary | ICD-10-CM | POA: Diagnosis not present

## 2019-07-30 DIAGNOSIS — Z79899 Other long term (current) drug therapy: Secondary | ICD-10-CM

## 2019-07-30 DIAGNOSIS — M0579 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 without organ or systems involvement: Secondary | ICD-10-CM

## 2019-07-30 DIAGNOSIS — Z950 Presence of cardiac pacemaker: Secondary | ICD-10-CM | POA: Diagnosis not present

## 2019-07-30 DIAGNOSIS — M19041 Primary osteoarthritis, right hand: Secondary | ICD-10-CM | POA: Diagnosis not present

## 2019-07-30 DIAGNOSIS — M19042 Primary osteoarthritis, left hand: Secondary | ICD-10-CM | POA: Diagnosis not present

## 2019-07-30 NOTE — Progress Notes (Signed)
Virtual Visit via Telephone Note  I connected with [name] on 07/30/19 at  2:00 PM EST by telephone and verified that I am speaking with the correct person using two identifiers.  Location: Patient: Home  Provider: Clinic  This service was conducted via virtual visit.  Both audio and visual tools were used.  The patient was located at home. I was located in my office.  Consent was obtained prior to the virtual visit and is aware of possible charges through their insurance for this visit.  The patient is an established patient.  Dr. Estanislado Pandy, MD conducted the virtual visit and Hazel Sams, PA-C acted as scribe during the service.  Office staff helped with scheduling follow up visits after the service was conducted.   I discussed the limitations, risks, security and privacy concerns of performing an evaluation and management service by telephone and the availability of in person appointments. I also discussed with the patient that there may be a patient responsible charge related to this service. The patient expressed understanding and agreed to proceed.  CC: Pain in both hands  History of Present Illness: Patient is a 73 year old female with a past medical history of seropositive rheumatoid arthritis and osteoarthritis.  She in on Enbrel 50 mg sq weekly injections and Arava 10 mg 1 tablet daily.   She is having increased pain in both hands.  She is having difficulty opening jars due to the discomfort.  She is having joint swelling making it difficult to make a complete fist.  She is having worsening morning stiffness.  She has been experiencing worsening insomnia and symptoms of RLS.  She has not discussed this with her PCP yet.    Review of Systems  Constitutional: Negative for fever and malaise/fatigue.  Eyes: Negative for photophobia, pain, discharge and redness.  Respiratory: Negative for cough, shortness of breath and wheezing.   Cardiovascular: Negative for chest pain and  palpitations.  Gastrointestinal: Negative for blood in stool, constipation and diarrhea.  Genitourinary: Negative for dysuria.  Musculoskeletal: Positive for joint pain. Negative for back pain, myalgias and neck pain.       +Morning stiffness  +Joint swelling   Skin: Negative for rash.  Neurological: Negative for dizziness and headaches.  Psychiatric/Behavioral: Negative for depression. The patient has insomnia. The patient is not nervous/anxious.       Observations/Objective: Physical Exam  Constitutional: She is oriented to person, place, and time.  Neurological: She is alert and oriented to person, place, and time.  Psychiatric: Mood, memory, affect and judgment normal.   Patient reports morning stiffness for 1 hour.   Patient reports nocturnal pain.  Difficulty dressing/grooming: Denies Difficulty climbing stairs: Denies Difficulty getting out of chair: Denies Difficulty using hands for taps, buttons, cutlery, and/or writing: Reports   Assessment and Plan: Visit Diagnoses: 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 without organ or systems involvement ([place]) - +RF, +CCP, -ANA: She is having increased pain, inflammation, and morning stiffness in both hands.  She has been having difficulty making a complete fist and opening jars.  She has also had discomfort in both shoulder joints and both elbow joints.  She has had some increased neck stiffness. She is on Enbrel 50 mg sq injections every week and Arava 10 mg 1 tablet daily.  She has not missed any doses of Enbrel or Arava recently. She does not need any refills at this time.  We will send in a prednisone taper starting at 20  mg tapering by 5 mg every 2 days.  She was advised to notify us if she has persistent or worsening joint pain. She will follow up in 3 months.  High risk medication use - Enbrel SureClick 50 mg every 7 days (started on 06/25/16) and Arava 10 mg 1 tablet daily.  Last TB gold negative on 05/11/19.   CBC and CMP were drawn on 07/04/19.  She is due to update lab work in February and every 3 months.    Primary osteoarthritis of both hands - She is having increased pain and stiffness in both hands.  She has joint swelling which is worse in the morning.  She has had difficulties with ADLs due to the discomfort.    History of total bilateral knee replacement - Right= 2013. Left=2003 - Doing well.  She has no discomfort at this time.   Osteopenia of right hip -DEXA 01/26/2017 right femoral neck BMD 0.652, T score -1.8.  DEXA on 04/25/19 right femoral neck BMD 0.692 with T-score -1.4.   She has been taking calcium and vitamin D supplements.    Greater trochanteric bursitis of left hip - Resolved   Neck pain -She has been having increased pain and stiffness in her C-spine.    Other medical conditions are listed as follows:   Multiple pulmonary nodules  History of hypertension   Fatty liver disease, nonalcoholic   History of diabetes mellitus   History of hyperlipidemia   History of gastroesophageal reflux (GERD)   Follow Up Instructions: She will follow up in 3 months.    I discussed the assessment and treatment plan with the patient. The patient was provided an opportunity to ask questions and all were answered. The patient agreed with the plan and demonstrated an understanding of the instructions.   The patient was advised to call back or seek an in-person evaluation if the symptoms worsen or if the condition fails to improve as anticipated.  I provided 25 minutes of non-face-to-face time during this encounter.   Bo Merino, MD   Scribed by-  Hazel Sams, PA-C

## 2019-07-30 NOTE — Addendum Note (Signed)
Addended by: Bo Merino on: 07/30/2019 02:31 PM   Modules accepted: Level of Service

## 2019-07-30 NOTE — Telephone Encounter (Signed)
Faxed requested information to insurance company 07/30/2019. Closing encounter.  Thanks!

## 2019-07-30 NOTE — Telephone Encounter (Signed)
Received fax from insurance that was sent to the office for Enbrel PA, request was denied due to not receiving a response. Faxing information requested for an expedited appeal.  Phone#[phone] Fax# [phone]  10:24 AM Beatriz Chancellor, CPhT

## 2019-07-30 NOTE — Telephone Encounter (Signed)
Insurance company has more questions regarding PA for patient. Please use reference # [id number]

## 2019-07-31 ENCOUNTER — Telehealth: Payer: Self-pay | Admitting: Rheumatology

## 2019-07-31 MED ORDER — PREDNISONE 5 MG PO TABS: ORAL_TABLET | ORAL | 0 refills | Status: DC

## 2019-07-31 NOTE — Telephone Encounter (Signed)
Patient left a message requesting a call back. Per patient, a rx was suppose to be sent to [facility] on [address] for Prednisone after her virtual appointment yesterday. Patient contacted pharmacy, and they do not have rx for her. Please call to advise.

## 2019-07-31 NOTE — Telephone Encounter (Signed)
Prednisone taper has been sent to the pharmacy, patient is aware.

## 2019-08-01 ENCOUNTER — Ambulatory Visit: Payer: PPO | Admitting: Rheumatology

## 2019-08-01 NOTE — Telephone Encounter (Signed)
Received notification from [place] regarding a prior authorization for ENBREL. Authorization has been APPROVED from 07/30/19 to 07/29/20.   Will send document to scan center. Faxed to [employer] for PAP renewal.

## 2019-08-02 ENCOUNTER — Telehealth: Payer: Self-pay | Admitting: Rheumatology

## 2019-08-02 NOTE — Telephone Encounter (Signed)
Returned patient's call and advised of appeal that was submitted for Enbrel PA and was approved. Approval letter was sent to [employer] for patient's [id number] PAP renewal application. Application is in process.

## 2019-08-02 NOTE — Telephone Encounter (Signed)
[name] has been helping patient with intake forms. Patient received two messages, one saying it was declined and another saying approved. Patient does not know which one is correct. Please call to advise.

## 2019-08-15 ENCOUNTER — Telehealth: Payer: Self-pay | Admitting: Rheumatology

## 2019-08-15 MED ORDER — ENBREL SURECLICK 50 MG/ML ~~LOC~~ SOAJ: SUBCUTANEOUS | 2 refills | Status: DC

## 2019-08-15 NOTE — Telephone Encounter (Signed)
Patient called stating she received a call to let her know she has been approved for Enbrel.  Patient states she only has 1 injection left.  Patient states she was told a new prescription would need to be sent before they will refill her prescription.

## 2019-08-15 NOTE — Telephone Encounter (Signed)
Last Visit: 07/30/2019 telemedicine  Next Visit: 10/31/2019 Labs: 07/04/2019 glucose 216, absolute monocytes 980. TB Gold:  05/11/2019 negative   Okay to refill per Dr. Estanislado Pandy.     A new prescription has been sent. FYI about approval.

## 2019-08-25 ENCOUNTER — Other Ambulatory Visit: Payer: Self-pay | Admitting: Family Medicine

## 2019-09-11 ENCOUNTER — Other Ambulatory Visit: Payer: Self-pay

## 2019-09-11 ENCOUNTER — Encounter: Payer: Self-pay | Admitting: Internal Medicine

## 2019-09-11 ENCOUNTER — Ambulatory Visit: Payer: PPO | Admitting: Internal Medicine

## 2019-09-11 VITALS — BP 178/102 | HR 91 | Ht <= 58 in | Wt 194.4 lb

## 2019-09-11 DIAGNOSIS — Z950 Presence of cardiac pacemaker: Secondary | ICD-10-CM | POA: Diagnosis not present

## 2019-09-11 DIAGNOSIS — I455 Other specified heart block: Secondary | ICD-10-CM | POA: Diagnosis not present

## 2019-09-11 DIAGNOSIS — R55 Syncope and collapse: Secondary | ICD-10-CM | POA: Diagnosis not present

## 2019-09-11 DIAGNOSIS — I495 Sick sinus syndrome: Secondary | ICD-10-CM

## 2019-09-11 DIAGNOSIS — I480 Paroxysmal atrial fibrillation: Secondary | ICD-10-CM | POA: Diagnosis not present

## 2019-09-11 MED ORDER — FUROSEMIDE 40 MG PO TABS: ORAL_TABLET | ORAL | 3 refills | Status: DC

## 2019-09-11 MED ORDER — LOSARTAN POTASSIUM 50 MG PO TABS: 50.0000 mg | ORAL_TABLET | Freq: Every day | ORAL | 3 refills | Status: DC

## 2019-09-11 NOTE — Progress Notes (Signed)
Patient Care Team: [facility], Modena Nunnery, MD as PCP - General (Family Medicine) Lorretta Harp, MD as PCP - Cardiology (Cardiology)   HPI  [name] is a 74 y.o. 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  Was having frequent episodes of presyncope.  Exercise intolerance.  Following pacing no further lightheadedness.  Exercise tolerance is improved.  Thromboembolic risk factors ( age  -2, HTN-1, DM-1, Gender-1) for a CHADSVASc Score of 5    DATE TEST EF   7/20 Echo   60-65 % LAE (4.4/--/38)  8/20 MYOVIEW   64 % No ischemia        Sleep disordered breathing.  Daytime somnolence.    Date Cr K Hgb  8/20 0.9 3.6 13.3  11/20 0.87 3.7 13.1    Significant peripheral edema.  She has not tolerated amlodipine in the past.  She has been on diltiazem for some time.  Salt intake is exuberant.  No chest pain.  Shortness of breath is improved but still present with exertion.  No palpitations.  Records and Results Reviewed   Past Medical History:  Diagnosis Date  . Abrasion of skin with infection 05/09/2013  . Acute blood loss anemia 08/05/2012  . Anemia 11/21/2013  . Arthritis    osteoarthritis  . Atrial fibrillation (HCC)    Paroxysmal atrial fibrillation  . Bursitis of right shoulder   . Chronic cholecystitis with calculus 05/04/2013  . 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 Diabetes type 2, controlled ([place]) 05/04/2013  . Essential hypertension, benign 10/14/2012   Essential hypertension  . Fatty liver disease, nonalcoholic [id number]   hospitalized, MRCP dx fatty liver  . Gastroenteritis 10/16/2013  . GERD (gastroesophageal reflux disease)   . History of hypertension 08/20/2016  . History of total bilateral knee replacement 06/22/2017   Right= 2013. Left=2003  . Hormone replacement therapy (postmenopausal)   . 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 Hyperlipidemia   . Hypertension   .  Hypertensive urgency 07/11/2018  . Hypokalemia 07/11/2018  . Hypomagnesemia 07/21/2016  . Hypothyroidism 03/28/2019  . Malnutrition of moderate degree ([place]) 03/18/2014  . Morbid obesity due to excess calories ([place]) 12/27/2012   Body mass index is 37.16 kg/m.  -  trending up  Lab Results Component Value Date  TSH 1.520 10/15/2013    Contributing to gerd risk/ doe/reviewed the need and the process to achieve and maintain neg calorie balance > defer f/u primary care including intermittently monitoring thyroid status    . Multiple pulmonary nodules 04/04/2014   Followed in Pulmonary clinic/ [facility]/ [place] - See CT chest 03/17/14 > not viz on abd CT from 10/16/13 as did not go high enough on lung slices >>>in tickle file for recall 06/17/14  - Spirometry 04/04/14 wnl sp smoking cessation 02/2014 - 06/26/2014 Right middle lobe 1.0 cm pulmonary nodule is similar over the past 3 months. This remains indeterminate.   - CT 12/25/14  Pulmonary nodules are st  . Nausea & vomiting 07/11/2018  . Normal cardiac stress test    pt can't remember when or where  . Obesity, unspecified 12/27/2012  . Osteoarthritis of right knee 08/05/2012  . 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 without organ or systems involvement ([place])   . Rheumatoid arthritis(714.0) 09/28/2012  . Sepsis ([place]) 03/16/2014  . Sinus pause 05/17/2019   Sinus pauses  . SIRS (systemic inflammatory response syndrome) (HCC)  07/11/2018  . Syncope and collapse 03/03/2019  . 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 Thyroid nodule 03/05/2016   See CT chest 02/25/16 - u/s thyroid 03/04/16 > Pos L nodules > both Bx [id number] > BENIGN FOLLICULAR NODULE  . Unspecified essential hypertension 05/04/2013    Past Surgical History:  Procedure Laterality Date  . ABDOMINAL HYSTERECTOMY    . CHOLECYSTECTOMY N/A 05/04/2013   Procedure: LAPAROSCOPIC CHOLECYSTECTOMY WITH INTRAOPERATIVE CHOLANGIOGRAM;  Surgeon: Haywood Lasso, MD;  Location:  [facility];  Service: General;  Laterality: N/A;  . ERCP N/A 05/02/2013   Procedure: ENDOSCOPIC RETROGRADE CHOLANGIOPANCREATOGRAPHY (ERCP);  Surgeon: Beryle Beams, MD;  Location: [facility];  Service: Endoscopy;  Laterality: N/A;  . JOINT REPLACEMENT     left knee  . NASAL SINUS SURGERY    . PACEMAKER IMPLANT N/A 06/11/2019   Procedure: PACEMAKER IMPLANT;  Surgeon: Deboraha Sprang, MD;  Location: [facility];  Service: Cardiovascular;  Laterality: N/A;  . SPHINCTEROTOMY  05/02/2013   Procedure: SPHINCTEROTOMY;  Surgeon: Beryle Beams, MD;  Location: [facility];  Service: Endoscopy;;  . TONSILLECTOMY    . TOTAL KNEE ARTHROPLASTY  08/01/2012   Procedure: TOTAL KNEE ARTHROPLASTY;  Surgeon: Meredith Pel, MD;  Location: [facility];  Service: Orthopedics;  Laterality: Right;  Right total knee arthroplasty  . TOTAL KNEE ARTHROPLASTY  08/01/2012   RIGHT  KNEE    Current Meds  Medication Sig  . apixaban (ELIQUIS) 5 MG TABS tablet Take 1 tablet (5 mg total) by mouth 2 (two) times daily.  Marland Kitchen atorvastatin (LIPITOR) 10 MG tablet TAKE 1 TABLET BY MOUTH EVERY DAY  . Calcium Carbonate-Vitamin D (CALCIUM 600 + D PO) Take 1 tablet by mouth 2 (two) times daily.  . carvedilol (COREG) 12.5 MG tablet Take 1 tablet (12.5 mg total) by mouth 2 (two) times daily with a meal.  . diclofenac sodium (VOLTAREN) 1 % GEL Apply 3 g topically daily as needed (to painful sites (in 3 large joints)).   Marland Kitchen diltiazem (CARDIZEM CD) 360 MG 24 hr capsule TAKE 1 CAPSULE BY MOUTH EVERY DAY  . estradiol (ESTRACE) 0.5 MG tablet Take 0.5 mg by mouth 2 (two) times a day.  . etanercept (ENBREL SURECLICK) 50 MG/ML injection INJECT 50 MG (0.98 ML) UNDER THE SKIN ONCE A WEEK  . fish oil-omega-3 fatty acids 1000 MG capsule Take 1 g by mouth at bedtime.   . folic acid (FOLVITE) 1 MG tablet TAKE 1 TABLET BY MOUTH TWICE A DAY  . leflunomide (ARAVA) 10 MG tablet TAKE 1 TABLET BY MOUTH EVERY DAY  . levothyroxine (SYNTHROID) 50 MCG tablet  TAKE 1 TABLET BY MOUTH DAILY BEFORE BREAKFAST  . Magnesium Oxide 400 MG CAPS Take 1 capsule (400 mg total) by mouth 2 (two) times daily.  . metFORMIN (GLUCOPHAGE) 500 MG tablet Take 1 tablet (500 mg total) by mouth daily with breakfast.  . methocarbamol (ROBAXIN) 500 MG tablet Take 1 tablet (500 mg total) by mouth daily as needed for muscle spasms.  . Multiple Vitamins-Minerals (MULTIVITAMINS THER. W/MINERALS) TABS Take 1 tablet by mouth daily.  . pantoprazole (PROTONIX) 40 MG tablet TAKE 1 TABLET BY MOUTH EVERY DAY  . vitamin B-12 (CYANOCOBALAMIN) 500 MCG tablet Take 500 mcg by mouth daily.    Allergies  Allergen Reactions  . Monopril [Fosinopril] Cough  . Magnesium-Containing Compounds Nausea Only and Other (See Comments)    Re: Mag-Ox at higher doses caused a lot of stomach upset (dose had  to be decreased)  . Norvasc [Amlodipine Besylate] Other (See Comments)    10 mg dose caused LE edema- can tolerate 5 mg dose      Review of Systems negative except from HPI and PMH  Physical Exam BP (!) 178/102   Pulse 91   Ht 4\' 10"  (1.473 m)   Wt 194 lb 6.4 oz (88.2 kg)   SpO2 95%   BMI 40.63 kg/m  Well developed and well nourished in no acute distress HENT normal Neck supple with JVP-6-8 cm Clear Device pocket well healed; without hematoma or erythema.  There is no tethering  Regular rate and rhythm, no  gallop No murmur Abd-soft with active BS No Clubbing cyanosis 2+ edema Skin-warm and dry A & Oriented  Grossly normal sensory and motor function  ECG sinus at 91 Intervals 18/08/36  CrCl cannot be calculated (Patient's most recent lab result is older than the maximum 21 days allowed.).   Assessment and  Plan  Atrial fibrillation with a rapid ventricular response  Sinus node dysfunction with pauses of greater than 3 seconds  Pacemaker-Medtronic (DOI 10/20) The patient's device was interrogated and the information was fully reviewed.  The device was reprogrammed to maximize  longevity  Syncope  Hypertension  Sleep disordered breathing  HFpEF  Obesity   Infrequent interval atrial fibrillation.  On Anticoagulation;  No bleeding issues   Volume overloaded.  We will add furosemide.  I am not sure that chlorthalidone will be sufficient.  We will start at 40 mg.  Once she has become euvolemic, we will have her take it every other day.  Check metabolic profile in 2 weeks.  Aware of previous intolerances to ACE inhibitor; we will begin her on losartan 50 mg daily.  Discussed the importance of a low-sodium diet  We spent more than 50% of our >30 min visit in face to face counseling regarding the above   Current medicines are reviewed at length with the patient today .  The patient does not  have concerns regarding medicines.

## 2019-09-11 NOTE — Patient Instructions (Signed)
Medication Instructions:  Your physician has recommended you make the following change in your medication:  Take Furosemide 40mg  1 tablet daily by mouth until your swelling resolves.  Then you will take 1 tablet by mouth every other day.  Losartan 50 mg daily, 1 tablet by mouth daily   Labwork:Your physician recommends that you return for lab work in: BMET in 2 weeks  09/25/2019 at 945 AM.  Testing/Procedures: None ordered.  Follow-Up: Your physician wants you to follow-up in: 9 months. You will receive a reminder letter in the mail two months in advance. If you don't receive a letter, please call our office to schedule the follow-up appointment.  Remote monitoring is used to monitor your Pacemaker of ICD from home. This monitoring reduces the number of office visits required to check your device to one time per year. It allows [place] to keep an eye on the functioning of your device to ensure it is working properly.   Any Other Special Instructions Will Be Listed Below (If Applicable).  If you need a refill on your cardiac medications before your next appointment, please call your pharmacy.

## 2019-09-20 ENCOUNTER — Other Ambulatory Visit: Payer: Self-pay | Admitting: Rheumatology

## 2019-09-20 NOTE — Telephone Encounter (Signed)
Last Visit: 07/30/19 Next Visit: 10/31/19 Labs: 07/04/19 Absolute Monocytes 980, Glucose 216  Okay to refill per Dr. Estanislado Pandy

## 2019-09-21 ENCOUNTER — Other Ambulatory Visit: Payer: Self-pay | Admitting: Family Medicine

## 2019-09-25 ENCOUNTER — Other Ambulatory Visit: Payer: PPO

## 2019-09-25 ENCOUNTER — Other Ambulatory Visit: Payer: Self-pay

## 2019-09-25 DIAGNOSIS — I480 Paroxysmal atrial fibrillation: Secondary | ICD-10-CM

## 2019-09-25 DIAGNOSIS — R55 Syncope and collapse: Secondary | ICD-10-CM | POA: Diagnosis not present

## 2019-09-25 DIAGNOSIS — I455 Other specified heart block: Secondary | ICD-10-CM

## 2019-09-25 DIAGNOSIS — I495 Sick sinus syndrome: Secondary | ICD-10-CM

## 2019-09-25 DIAGNOSIS — Z950 Presence of cardiac pacemaker: Secondary | ICD-10-CM

## 2019-09-25 LAB — BASIC METABOLIC PANEL
BUN/Creatinine Ratio: 19 (ref 12–28)
BUN: 21 mg/dL (ref 8–27)
CO2: 25 mmol/L (ref 20–29)
Calcium: 9.7 mg/dL (ref 8.7–10.3)
Chloride: 98 mmol/L (ref 96–106)
Creatinine, Ser: 1.11 mg/dL — ABNORMAL HIGH (ref 0.57–1.00)
GFR calc Af Amer: 56 mL/min/{1.73_m2} — ABNORMAL LOW (ref >59)
GFR calc non Af Amer: 49 mL/min/{1.73_m2} — ABNORMAL LOW (ref >59)
Glucose: 214 mg/dL — ABNORMAL HIGH (ref 65–99)
Potassium: 3.4 mmol/L — ABNORMAL LOW (ref 3.5–5.2)
Sodium: 140 mmol/L (ref 134–144)

## 2019-09-26 ENCOUNTER — Telehealth: Payer: Self-pay

## 2019-09-26 DIAGNOSIS — E876 Hypokalemia: Secondary | ICD-10-CM

## 2019-09-26 NOTE — Telephone Encounter (Signed)
Pt K from 09/25/19 was 3.4  LM for the pt to call to clarify if she is still taking the lasix 40 mg daily or if she is now taking it every other day... based on her edema.   No K supplements noted.   Will forward to Dr. Caryl Comes for review.

## 2019-09-26 NOTE — Telephone Encounter (Signed)
[name] lets begin her on K 10 meq daily And recheck in about 2 weeks Thanks SK

## 2019-09-27 NOTE — Telephone Encounter (Signed)
LMTCB

## 2019-10-01 NOTE — Telephone Encounter (Signed)
Left message to call back  

## 2019-10-01 NOTE — Telephone Encounter (Signed)
Follow Up  Pt was returning phone call.   Please call back

## 2019-10-02 NOTE — Telephone Encounter (Signed)
Spoke with pt and during conversation phone call disconnected.  Attempted callback and received voicemail message.  Left message to call RN back at [phone].

## 2019-10-03 MED ORDER — POTASSIUM CHLORIDE ER 10 MEQ PO TBCR: 10.0000 meq | EXTENDED_RELEASE_TABLET | Freq: Every day | ORAL | 3 refills | Status: AC

## 2019-10-03 NOTE — Telephone Encounter (Signed)
Spoke with pt and advised her recent labwork shows her potassium is low.  Pt reports she is taking Lasix 40mg  daily and edema is much improved.  Pt advised per Dr Caryl Comes pt will need to start Potassium 24meq, one tablet daily and repeat BMET in 2 weeks.  Appointment scheduled for BMET 10/17/2019, may come between 8am and 430pm.  Pt verbalizes understanding and agrees with current plan.

## 2019-10-08 ENCOUNTER — Encounter: Payer: Self-pay | Admitting: *Deleted

## 2019-10-12 ENCOUNTER — Other Ambulatory Visit: Payer: Self-pay | Admitting: Family Medicine

## 2019-10-12 DIAGNOSIS — R5383 Other fatigue: Secondary | ICD-10-CM

## 2019-10-12 DIAGNOSIS — R946 Abnormal results of thyroid function studies: Secondary | ICD-10-CM

## 2019-10-17 ENCOUNTER — Other Ambulatory Visit: Payer: PPO | Admitting: *Deleted

## 2019-10-17 ENCOUNTER — Other Ambulatory Visit: Payer: Self-pay

## 2019-10-17 DIAGNOSIS — E876 Hypokalemia: Secondary | ICD-10-CM

## 2019-10-18 LAB — BASIC METABOLIC PANEL
BUN/Creatinine Ratio: 20 (ref 12–28)
BUN: 21 mg/dL (ref 8–27)
CO2: 23 mmol/L (ref 20–29)
Calcium: 9.2 mg/dL (ref 8.7–10.3)
Chloride: 100 mmol/L (ref 96–106)
Creatinine, Ser: 1.04 mg/dL — ABNORMAL HIGH (ref 0.57–1.00)
GFR calc Af Amer: 61 mL/min/{1.73_m2} (ref >59)
GFR calc non Af Amer: 53 mL/min/{1.73_m2} — ABNORMAL LOW (ref >59)
Glucose: 268 mg/dL — ABNORMAL HIGH (ref 65–99)
Potassium: 3.9 mmol/L (ref 3.5–5.2)
Sodium: 141 mmol/L (ref 134–144)

## 2019-10-24 NOTE — Progress Notes (Signed)
Office Visit Note  Patient: [name]             Date of Birth: Mar 02, 1946           MRN: [id number]             PCP: Alycia Rossetti, MD Referring: Alycia Rossetti, MD Visit Date: 10/31/2019 Occupation: @GUAROCC @  Subjective:  Pain in both hands   History of Present Illness: [name] is a 74 y.o. female has history of seropositive rheumatoid arthritis and osteoarthritis.  Patient is on Enbrel 50 mg subcutaneous injections once weekly and Arava 20 mg 1 tablet by mouth daily.  She has not missed any doses of Enbrel or Arava recently.  She has not had any recent infections.  She states she is still apprehensive to receive the COVID-19 vaccination.  She continues to have intermittent pain and swelling in both hands and both wrist joints.  She has been having some discomfort in the right elbow.  She states that both knee replacements are doing well.  She has ongoing left trochanteric bursitis.  Activities of Daily Living:  Patient reports morning stiffness for several hours.   Patient Denies nocturnal pain.  Difficulty dressing/grooming: Denies Difficulty climbing stairs: Denies Difficulty getting out of chair: Denies Difficulty using hands for taps, buttons, cutlery, and/or writing: Reports  Review of Systems  Constitutional: Positive for fatigue.  HENT: Positive for mouth dryness and nose dryness. Negative for mouth sores.   Eyes: Positive for dryness. Negative for pain and visual disturbance.  Respiratory: Negative for cough, hemoptysis, shortness of breath, wheezing and difficulty breathing.   Cardiovascular: Negative for chest pain, palpitations, hypertension and swelling in legs/feet.  Gastrointestinal: Negative for blood in stool, constipation and diarrhea.  Endocrine: Negative for increased urination.  Genitourinary: Negative for difficulty urinating and painful urination.  Musculoskeletal: Positive for arthralgias, joint pain, joint swelling and morning  stiffness. Negative for myalgias, muscle weakness, muscle tenderness and myalgias.  Skin: Negative for color change, pallor, rash, hair loss, nodules/bumps, skin tightness, ulcers and sensitivity to sunlight.  Allergic/Immunologic: Negative for susceptible to infections.  Neurological: Negative for dizziness, numbness, headaches and memory loss.  Hematological: Positive for bruising/bleeding tendency. Negative for swollen glands.  Psychiatric/Behavioral: Negative for depressed mood, confusion and sleep disturbance. The patient is not nervous/anxious.     PMFS History:  Patient Active Problem List   Diagnosis Date Noted  . 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 Obesity (BMI 30-39.9) 07/04/2019  . Tachy-brady syndrome ([place]) 06/11/2019  . Sinus pause 05/17/2019  . Hypothyroidism 03/28/2019  . Atrial fibrillation ([place])   . Syncope and collapse 03/03/2019  . Diabetes mellitus type 2, uncomplicated ([place]) 123456  . History of total bilateral knee replacement 06/22/2017  . Primary osteoarthritis of both hands 08/20/2016  . Hypomagnesemia 07/21/2016  . Thyroid nodule 03/05/2016  . Multiple pulmonary nodules 04/04/2014  . Anemia 11/21/2013  . 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 Bursitis of right shoulder   . GERD (gastroesophageal reflux disease)   . 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 without organ or systems involvement ([place])   . Essential hypertension, benign 10/14/2012  . Hyperlipidemia   . Fatty liver disease, nonalcoholic   . Osteoarthritis of right knee 08/05/2012    Past Medical History:  Diagnosis Date  . Abrasion of skin with infection 05/09/2013  . Acute blood loss anemia 08/05/2012  . Anemia 11/21/2013  . Arthritis    osteoarthritis  . Atrial  fibrillation (HCC)    Paroxysmal atrial fibrillation  . Bursitis of right shoulder   . Chronic cholecystitis with calculus 05/04/2013  . Diabetes mellitus without complication (HCC)    Type 2  NIDDM x 15 years  . Diabetes type 2, controlled ([place]) 05/04/2013  . Essential hypertension, benign 10/14/2012   Essential hypertension  . Fatty liver disease, nonalcoholic [id number]   hospitalized, MRCP dx fatty liver  . Gastroenteritis 10/16/2013  . GERD (gastroesophageal reflux disease)   . History of hypertension 08/20/2016  . History of total bilateral knee replacement 06/22/2017   Right= 2013. Left=2003  . Hormone replacement therapy (postmenopausal)   . 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 Hyperlipidemia   . Hypertension   . Hypertensive urgency 07/11/2018  . Hypokalemia 07/11/2018  . Hypomagnesemia 07/21/2016  . Hypothyroidism 03/28/2019  . Malnutrition of moderate degree ([place]) 03/18/2014  . Morbid obesity due to excess calories ([place]) 12/27/2012   Body mass index is 37.16 kg/m.  -  trending up  Lab Results Component Value Date  TSH 1.520 10/15/2013    Contributing to gerd risk/ doe/reviewed the need and the process to achieve and maintain neg calorie balance > defer f/u primary care including intermittently monitoring thyroid status    . Multiple pulmonary nodules 04/04/2014   Followed in Pulmonary clinic/ [facility]/ [place] - See CT chest 03/17/14 > not viz on abd CT from 10/16/13 as did not go high enough on lung slices >>>in tickle file for recall 06/17/14  - Spirometry 04/04/14 wnl sp smoking cessation 02/2014 - 06/26/2014 Right middle lobe 1.0 cm pulmonary nodule is similar over the past 3 months. This remains indeterminate.   - CT 12/25/14  Pulmonary nodules are st  . Nausea & vomiting 07/11/2018  . Normal cardiac stress test    pt can't remember when or where  . Obesity, unspecified 12/27/2012  . Osteoarthritis of right knee 08/05/2012  . 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 without organ or systems involvement ([place])   . Rheumatoid arthritis(714.0) 09/28/2012  . Sepsis ([place]) 03/16/2014  . Sinus pause 05/17/2019   Sinus pauses  . SIRS (systemic inflammatory response  syndrome) ([place]) 07/11/2018  . Syncope and collapse 03/03/2019  . 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 Thyroid nodule 03/05/2016   See CT chest 02/25/16 - u/s thyroid 03/04/16 > Pos L nodules > both Bx [id number] > BENIGN FOLLICULAR NODULE  . Unspecified essential hypertension 05/04/2013    Family History  Problem Relation Age of Onset  . Cancer Mother        breast  . Heart attack Mother   . Stroke Father   . Diabetes Sister   . Diabetes Daughter    Past Surgical History:  Procedure Laterality Date  . ABDOMINAL HYSTERECTOMY    . CHOLECYSTECTOMY N/A 05/04/2013   Procedure: LAPAROSCOPIC CHOLECYSTECTOMY WITH INTRAOPERATIVE CHOLANGIOGRAM;  Surgeon: Haywood Lasso, MD;  Location: [facility];  Service: General;  Laterality: N/A;  . ERCP N/A 05/02/2013   Procedure: ENDOSCOPIC RETROGRADE CHOLANGIOPANCREATOGRAPHY (ERCP);  Surgeon: Beryle Beams, MD;  Location: [facility];  Service: Endoscopy;  Laterality: N/A;  . JOINT REPLACEMENT     left knee  . NASAL SINUS SURGERY    . PACEMAKER IMPLANT N/A 06/11/2019   Procedure: PACEMAKER IMPLANT;  Surgeon: Deboraha Sprang, MD;  Location: [facility];  Service: Cardiovascular;  Laterality: N/A;  . SPHINCTEROTOMY  05/02/2013   Procedure: SPHINCTEROTOMY;  Surgeon: Beryle Beams, MD;  Location: [facility];  Service: Endoscopy;;  . TONSILLECTOMY    . TOTAL KNEE ARTHROPLASTY  08/01/2012   Procedure: TOTAL KNEE ARTHROPLASTY;  Surgeon: Meredith Pel, MD;  Location: [facility];  Service: Orthopedics;  Laterality: Right;  Right total knee arthroplasty  . TOTAL KNEE ARTHROPLASTY  08/01/2012   RIGHT  KNEE   Social History   Social History Narrative  . Not on file   Immunization History  Administered Date(s) Administered  . Fluad Quad(high Dose 65+) 06/12/2019  . Influenza Split 05/23/2012  . Influenza, High Dose Seasonal PF 07/13/2018  . Influenza,inj,Quad PF,6+ Mos 06/16/2017  . Influenza-Unspecified 05/23/2013, 06/06/2014  . PPD Test  06/02/2016  . Pneumococcal Conjugate-13 07/04/2013  . Pneumococcal Polysaccharide-23 06/23/2011, 08/02/2012, 05/26/2016  . Tdap 06/23/2011, 05/23/2014  . Zoster 09/24/2011     Objective: Vital Signs: BP (!) 141/85 (BP Location: Left Arm, Patient Position: Sitting, Cuff Size: Normal)   Pulse 87   Resp 15   Ht 4\' 10"  (1.473 m)   Wt 192 lb (87.1 kg)   BMI 40.13 kg/m    Physical Exam Vitals and nursing note reviewed.  Constitutional:      Appearance: She is well-developed.  HENT:     Head: Normocephalic and atraumatic.  Eyes:     Conjunctiva/sclera: Conjunctivae normal.  Pulmonary:     Effort: Pulmonary effort is normal.  Abdominal:     General: Bowel sounds are normal.     Palpations: Abdomen is soft.  Musculoskeletal:     Cervical back: Normal range of motion.  Lymphadenopathy:     Cervical: No cervical adenopathy.  Skin:    General: Skin is warm and dry.     Capillary Refill: Capillary refill takes less than 2 seconds.  Neurological:     Mental Status: She is alert and oriented to person, place, and time.  Psychiatric:        Behavior: Behavior normal.      Musculoskeletal Exam: C-spine good range of motion.  Thoracic kyphosis noted.  Shoulder joints and elbow joints have good range of motion with no discomfort.  Wrist joints have limited range of motion.  She has tenderness and mild inflammation on the ulnar aspect of the right wrist.  She has synovial thickening, synovitis, and tenderness of the right second MCP.  She has PIP and DIP thickening consistent with osteoarthritis of both hands.  She has tenderness and synovitis of the left fourth MCP joint.  Hip joints have good range of motion with no discomfort.  Knee replacements have good range of motion no warmth or effusion.  Ankle joints have good range of motion with no tenderness.  She has pedal edema bilaterally.  CDAI Exam: CDAI Score: 6.8  Patient Global: 4 mm; Provider Global: 4 mm Swollen: 3 ; Tender: 3    Joint Exam 10/31/2019      Right  Left  Wrist  Swollen Tender     MCP 2  Swollen Tender     MCP 4     Swollen Tender     Investigation: No additional findings.  Imaging: No results found.  Recent Labs: Lab Results  Component Value Date   WBC 6.2 07/04/2019   HGB 13.1 07/04/2019   PLT 194 07/04/2019   NA 141 10/17/2019   K 3.9 10/17/2019   CL 100 10/17/2019   CO2 23 10/17/2019   GLUCOSE 268 (H) 10/17/2019   BUN 21 10/17/2019   CREATININE 1.04 (H) 10/17/2019   BILITOT 0.3 07/04/2019   ALKPHOS 113  03/03/2019   AST 18 07/04/2019   ALT 16 07/04/2019   PROT 6.4 07/04/2019   ALBUMIN 3.3 (L) 03/03/2019   CALCIUM 9.2 10/17/2019   GFRAA 61 10/17/2019   QFTBGOLD NEGATIVE 05/17/2017   QFTBGOLDPLUS NEGATIVE 05/11/2019    Speciality Comments: No specialty comments available.  Procedures:  No procedures performed Allergies: Monopril [fosinopril], Magnesium-containing compounds, and Norvasc [amlodipine besylate]   Assessment / Plan:     Visit Diagnoses: 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 without organ or systems involvement ([place]) - +RF, +CCP, -ANA: She has ongoing tenderness and synovitis in the right second MCP, ulnar aspect of the right wrist, and the left fourth MCP joint.  She experiences intermittent pain and inflammation in both hands and both wrist joints.  Overall she has been having infrequent flares and feels that Enbrel and Arava have been an effective combination.  She is on Enbrel 50 mg subcutaneous injections once weekly and Arava 10 mg 1 tablet by mouth daily.  She does not want to make any medication changes at this time.  She was advised to notify us if she develops more frequent flares.  She will follow-up in the office in 5 months.  High risk medication use - Enbrel SureClick 50 mg every 7 days (started on 06/25/16) and Arava 10 mg 1 tablet daily.  Last TB gold negative on 05/11/19.  CBC was drawn on 07/04/2019.  BMP was drawn on  10/17/2019.  We will update CBC and CMP today to monitor for drug toxicity.  She will return in June and every 3 months.  Standing orders for CBC and CMP are placed today.  She was encouraged to receive the COVID-19 vaccination.  She has not had any recent infections.  She was advised to hold [place] and Enbrel if she develops any signs or symptoms of infection and to resume once infection has completely cleared.- Plan: CBC with Differential/Platelet, COMPLETE METABOLIC PANEL WITH GFR  Primary osteoarthritis of both hands: She has PIP and DIP thickening consistent with osteoarthritis of both hands.  Joint protection and muscle strengthening were discussed.   History of total bilateral knee replacement - Right= 2013. Left=2003.  Doing well.  She has good range of motion with no discomfort bilaterally.  No warmth or effusion noted.  She has no difficulty climbing steps or getting up from a seated position.  Greater trochanteric bursitis of left hip - She has tenderness on exam today. She continues to have intermittent discomfort. She was encouraged to perform stretching exercises daily.  She was given a handout of exercises to perform.  Osteopenia of right hip - DEXA 01/26/2017 right femoral neck BMD 0.652, T score -1.8.  DEXA on 04/25/19 right femoral neck BMD 0.692 with T-score -1.4.  DEXA results were reviewed today in the office.  She is taking a vitamin D supplement daily.  She will be due to update DEXA in September 2022.  Other medical conditions are listed as follows:  History of hyperlipidemia  History of diabetes mellitus  Fatty liver disease, nonalcoholic  History of hypertension  Multiple pulmonary nodules  History of gastroesophageal reflux (GERD)  Orders: Orders Placed This Encounter  Procedures  . CBC with Differential/Platelet  . COMPLETE METABOLIC PANEL WITH GFR   No orders of the defined types were placed in this encounter.     Follow-Up Instructions: Return in about 5  months (around 04/01/2020) for Rheumatoid arthritis, Osteoarthritis.   Ofilia Neas, PA-C  Note - This record  has been created using [employer].  Chart creation errors have been sought, but may not always  have been located. Such creation errors do not reflect on  the standard of medical care.

## 2019-10-30 ENCOUNTER — Telehealth: Payer: Self-pay

## 2019-10-30 NOTE — Telephone Encounter (Signed)
Spoke with pt and advised per Dr Caryl Comes labs are normal with exception of elevated blood sugar.  Pt verbalizes understanding.

## 2019-10-30 NOTE — Telephone Encounter (Signed)
-----   Message from Deboraha Sprang, MD sent at 10/25/2019  8:31 PM EST ----- Please Inform Patient  Labs are normal x #elevated BS  Thanks

## 2019-10-31 ENCOUNTER — Encounter: Payer: Self-pay | Admitting: Physician Assistant

## 2019-10-31 ENCOUNTER — Encounter (INDEPENDENT_AMBULATORY_CARE_PROVIDER_SITE_OTHER): Payer: Self-pay

## 2019-10-31 ENCOUNTER — Ambulatory Visit (INDEPENDENT_AMBULATORY_CARE_PROVIDER_SITE_OTHER): Payer: PPO | Admitting: Physician Assistant

## 2019-10-31 ENCOUNTER — Other Ambulatory Visit: Payer: Self-pay

## 2019-10-31 VITALS — BP 141/85 | HR 87 | Resp 15 | Ht <= 58 in | Wt 192.0 lb

## 2019-10-31 DIAGNOSIS — Z96653 Presence of artificial knee joint, bilateral: Secondary | ICD-10-CM

## 2019-10-31 DIAGNOSIS — Z8639 Personal history of other endocrine, nutritional and metabolic disease: Secondary | ICD-10-CM | POA: Diagnosis not present

## 2019-10-31 DIAGNOSIS — M19042 Primary osteoarthritis, left hand: Secondary | ICD-10-CM | POA: Diagnosis not present

## 2019-10-31 DIAGNOSIS — Z8719 Personal history of other diseases of the digestive system: Secondary | ICD-10-CM | POA: Diagnosis not present

## 2019-10-31 DIAGNOSIS — M0579 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 without organ or systems involvement: Secondary | ICD-10-CM

## 2019-10-31 DIAGNOSIS — R918 Other nonspecific abnormal finding of lung field: Secondary | ICD-10-CM | POA: Diagnosis not present

## 2019-10-31 DIAGNOSIS — Z79899 Other long term (current) drug therapy: Secondary | ICD-10-CM

## 2019-10-31 DIAGNOSIS — K760 Fatty (change of) liver, not elsewhere classified: Secondary | ICD-10-CM | POA: Diagnosis not present

## 2019-10-31 DIAGNOSIS — M7062 Trochanteric bursitis, left hip: Secondary | ICD-10-CM

## 2019-10-31 DIAGNOSIS — M19041 Primary osteoarthritis, right hand: Secondary | ICD-10-CM

## 2019-10-31 DIAGNOSIS — M85851 Other specified disorders of bone density and structure, right thigh: Secondary | ICD-10-CM | POA: Diagnosis not present

## 2019-10-31 DIAGNOSIS — Z8679 Personal history of other diseases of the circulatory system: Secondary | ICD-10-CM

## 2019-10-31 NOTE — Patient Instructions (Addendum)
Standing Labs We placed an order today for your standing lab work.    Please come back and get your standing labs in June and every 3 months  We have open lab daily Monday through Thursday from 8:30-12:30 PM and 1:30-4:30 PM and Friday from 8:30-12:30 PM and 1:30-4:00 PM at the office of Dr. Bo Merino.   You may experience shorter wait times on Monday and Friday afternoons. The office is located at [address] [place], [place], [place] [address] No appointment is necessary.   Labs are drawn by [facility].  You may receive a bill from [facility] for your lab work.  If you wish to have your labs drawn at another location, please call the office 24 hours in advance to send orders.  If you have any questions regarding directions or hours of operation,  please call [phone].   Just as a reminder please drink plenty of water prior to coming for your lab work. Thanks!   Hip Bursitis Rehab Ask your health care provider which exercises are safe for you. Do exercises exactly as told by your health care provider and adjust them as directed. It is normal to feel mild stretching, pulling, tightness, or discomfort as you do these exercises. Stop right away if you feel sudden pain or your pain gets worse. Do not begin these exercises until told by your health care provider. Stretching exercise This exercise warms up your muscles and joints and improves the movement and flexibility of your hip. This exercise also helps to relieve pain and stiffness. Iliotibial band stretch An iliotibial band is a strong band of muscle tissue that runs from the outer side of your hip to the outer side of your thigh and knee. 1. Lie on your side with your left / right leg in the top position. 2. Bend your left / right knee and grab your ankle. Stretch out your bottom arm to help you balance. 3. Slowly bring your knee back so your thigh is behind your body. 4. Slowly lower your knee toward the floor until you  feel a gentle stretch on the outside of your left / right thigh. If you do not feel a stretch and your knee will not fall farther, place the heel of your other foot on top of your knee and pull your knee down toward the floor with your foot. 5. Hold this position for __________ seconds. 6. Slowly return to the starting position. Repeat __________ times. Complete this exercise __________ times a day. Strengthening exercises These exercises build strength and endurance in your hip and pelvis. Endurance is the ability to use your muscles for a long time, even after they get tired. Bridge This exercise strengthens the muscles that move your thigh backward (hip extensors). 1. Lie on your back on a firm surface with your knees bent and your feet flat on the floor. 2. Tighten your buttocks muscles and lift your buttocks off the floor until your trunk is level with your thighs. ? Do not arch your back. ? You should feel the muscles working in your buttocks and the back of your thighs. If you do not feel these muscles, slide your feet 1-2 inches (2.5-5 cm) farther away from your buttocks. ? If this exercise is too easy, try doing it with your arms crossed over your chest. 3. Hold this position for __________ seconds. 4. Slowly lower your hips to the starting position. 5. Let your muscles relax completely after each repetition. Repeat __________ times. Complete  this exercise __________ times a day. Squats This exercise strengthens the muscles in front of your thigh and knee (quadriceps). 1. Stand in front of a table, with your feet and knees pointing straight ahead. You may rest your hands on the table for balance but not for support. 2. Slowly bend your knees and lower your hips like you are going to sit in a chair. ? Keep your weight over your heels, not over your toes. ? Keep your lower legs upright so they are parallel with the table legs. ? Do not let your hips go lower than your knees. ? Do not  bend lower than told by your health care provider. ? If your hip pain increases, do not bend as low. 3. Hold the squat position for __________ seconds. 4. Slowly push with your legs to return to standing. Do not use your hands to pull yourself to standing. Repeat __________ times. Complete this exercise __________ times a day. Hip hike 1. Stand sideways on a bottom step. Stand on your left / right leg with your other foot unsupported next to the step. You can hold on to the railing or wall for balance if needed. 2. Keep your knees straight and your torso square. Then lift your left / right hip up toward the ceiling. 3. Hold this position for __________ seconds. 4. Slowly let your left / right hip lower toward the floor, past the starting position. Your foot should get closer to the floor. Do not lean or bend your knees. Repeat __________ times. Complete this exercise __________ times a day. Single leg stand 1. Without shoes, stand near a railing or in a doorway. You may hold on to the railing or door frame as needed for balance. 2. Squeeze your left / right buttock muscles, then lift up your other foot. ? Do not let your left / right hip push out to the side. ? It is helpful to stand in front of a mirror for this exercise so you can watch your hip. 3. Hold this position for __________ seconds. Repeat __________ times. Complete this exercise __________ times a day. This information is not intended to replace advice given to you by your health care provider. Make sure you discuss any questions you have with your health care provider. Document Revised: 12/04/2018 Document Reviewed: 12/04/2018 Elsevier Patient Education  [place].

## 2019-11-01 LAB — COMPLETE METABOLIC PANEL WITH GFR
AG Ratio: 1.3 (calc) (ref 1.0–2.5)
ALT: 25 U/L (ref 6–29)
AST: 27 U/L (ref 10–35)
Albumin: 3.4 g/dL — ABNORMAL LOW (ref 3.6–5.1)
Alkaline phosphatase (APISO): 92 U/L (ref 37–153)
BUN/Creatinine Ratio: 20 (calc) (ref 6–22)
BUN: 21 mg/dL (ref 7–25)
CO2: 25 mmol/L (ref 20–32)
Calcium: 9.1 mg/dL (ref 8.6–10.4)
Chloride: 101 mmol/L (ref 98–110)
Creat: 1.04 mg/dL — ABNORMAL HIGH (ref 0.60–0.93)
GFR, Est African American: 61 mL/min/{1.73_m2} (ref >=60)
GFR, Est Non African American: 53 mL/min/{1.73_m2} — ABNORMAL LOW (ref >=60)
Globulin: 2.7 g/dL (calc) (ref 1.9–3.7)
Glucose, Bld: 379 mg/dL — ABNORMAL HIGH (ref 65–99)
Potassium: 4 mmol/L (ref 3.5–5.3)
Sodium: 138 mmol/L (ref 135–146)
Total Bilirubin: 0.4 mg/dL (ref 0.2–1.2)
Total Protein: 6.1 g/dL (ref 6.1–8.1)

## 2019-11-01 LAB — CBC WITH DIFFERENTIAL/PLATELET
Absolute Monocytes: 777 cells/uL (ref 200–950)
Basophils Absolute: 29 cells/uL (ref 0–200)
Basophils Relative: 0.5 %
Eosinophils Absolute: 342 cells/uL (ref 15–500)
Eosinophils Relative: 5.9 %
HCT: 40.4 % (ref 35.0–45.0)
Hemoglobin: 13.1 g/dL (ref 11.7–15.5)
Lymphs Abs: 1572 cells/uL (ref 850–3900)
MCH: 31.3 pg (ref 27.0–33.0)
MCHC: 32.4 g/dL (ref 32.0–36.0)
MCV: 96.7 fL (ref 80.0–100.0)
MPV: 12.4 fL (ref 7.5–12.5)
Monocytes Relative: 13.4 %
Neutro Abs: 3080 cells/uL (ref 1500–7800)
Neutrophils Relative %: 53.1 %
Platelets: 150 10*3/uL (ref 140–400)
RBC: 4.18 10*6/uL (ref 3.80–5.10)
RDW: 12.5 % (ref 11.0–15.0)
Total Lymphocyte: 27.1 %
WBC: 5.8 10*3/uL (ref 3.8–10.8)

## 2019-11-01 NOTE — Progress Notes (Signed)
CBC WNL.  Creatinine remains borderline elevated. GFR stable-53. Please forward labs to PCP.   Glucose is very elevated-379.  Please notify patient ASAP and advise her to monitor her blood glucose closely.

## 2019-11-05 ENCOUNTER — Other Ambulatory Visit: Payer: Self-pay

## 2019-11-05 ENCOUNTER — Ambulatory Visit (INDEPENDENT_AMBULATORY_CARE_PROVIDER_SITE_OTHER): Payer: PPO | Admitting: Family Medicine

## 2019-11-05 ENCOUNTER — Encounter: Payer: Self-pay | Admitting: Family Medicine

## 2019-11-05 VITALS — BP 140/84 | HR 72 | Temp 98.3°F | Resp 14 | Ht <= 58 in | Wt 187.0 lb

## 2019-11-05 DIAGNOSIS — G2581 Restless legs syndrome: Secondary | ICD-10-CM

## 2019-11-05 DIAGNOSIS — K760 Fatty (change of) liver, not elsewhere classified: Secondary | ICD-10-CM

## 2019-11-05 DIAGNOSIS — M0579 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 without organ or systems involvement: Secondary | ICD-10-CM | POA: Diagnosis not present

## 2019-11-05 DIAGNOSIS — I480 Paroxysmal atrial fibrillation: Secondary | ICD-10-CM

## 2019-11-05 DIAGNOSIS — E782 Mixed hyperlipidemia: Secondary | ICD-10-CM

## 2019-11-05 DIAGNOSIS — E119 Type 2 diabetes mellitus without complications: Secondary | ICD-10-CM

## 2019-11-05 DIAGNOSIS — I10 Essential (primary) hypertension: Secondary | ICD-10-CM | POA: Diagnosis not present

## 2019-11-05 DIAGNOSIS — Z0000 Encounter for general adult medical examination without abnormal findings: Secondary | ICD-10-CM | POA: Diagnosis not present

## 2019-11-05 NOTE — Progress Notes (Signed)
Subjective:   Patient presents for Medicare Annual/Subsequent preventive examination.    Legs pain for months now, feels like she needs to move her legs around a lot, often gets out of bed to pace to help her lg discomfort, worse at night, sometimes during the day  no swelling in her legs, no change in color   she doesn't sleep well and is more fatigued due to getting up and down all night due to legs   RA- Taking Enbrel and Arava   DM- at rheumatology her blood sugar was 300's on her labs, she has been checking her CBG, last night 179 , she has not checked it fasting Last A1C 6.5% in November  2020  She is not sure more sweets before that bloodwork  Taking metformin 500mg  once a day   Seen by cardiology in Jan, [place] check was normal, continued on Eliquis for her paroxysmal atrial fibrillation.  She also is on beta-blocker and calcium channel blocker without any difficulties  she had lasix 40mg  daily added by cardiology in Jan due to feet swelling  For blood pressure she was started on losartan 50mg  once a day in Jan  Reviewed recent labs from March and Jan    Estradiol BID, given by GYN Dr. Philis Pique  Not needing muscle relaxer at this time   Reviewed supplments   Review Past Medical/Family/Social:per emr    Risk Factors  Current exercise habits: walks some  Dietary issues discussed: Yes  Cardiac risk factors: Obesity (BMI >= 30 kg/m2). A FIB, FATTY LIVER, htn   Depression Screen  (Note: if answer to either of the following is "Yes", a more complete depression screening is indicated)  Over the past two weeks, have you felt down, depressed or hopeless? No Over the past two weeks, have you felt little interest or pleasure in doing things? No Have you lost interest or pleasure in daily life? No Do you often feel hopeless? No Do you cry easily over simple problems? No   Activities of Daily Living  In your present state of health, do you have any difficulty performing the  following activities?:  Driving? No  Managing money? No  Feeding yourself? No  Getting from bed to chair? No  Climbing a flight of stairs? yes  Preparing food and eating?: No  Bathing or showering? No  Getting dressed: No  Getting to the toilet? No  Using the toilet:No  Moving around from place to place: yes  In the past year have you fallen or had a near fall?:No  Are you sexually active? No  Do you have more than one partner? No   Hearing Difficulties: No  Do you often ask people to speak up or repeat themselves? No  Do you experience ringing or noises in your ears? No Do you have difficulty understanding soft or whispered voices? No  Do you feel that you have a problem with memory? No Do you often misplace items? No  Do you feel safe at home? Yes  Cognitive Testing  Alert? Yes Normal Appearance?Yes  Oriented to person? Yes Place? Yes  Time? Yes  Recall of three objects? Yes  Can perform simple calculations? Yes  Displays appropriate judgment?Yes  Can read the correct time from a watch face?Yes   List the Names of Other Physician/Practitioners you currently use:  Rheuamatology  Cardiology  Optometrist  - [facility]   Screening Tests / Date Colonoscopy   UTD  Zostavax  UTD Bone Density- UTD Influenza Vaccine  UTD Pneumonia uTD Tetanus/tdap  UTD  Mammgram  DUE    ROS: GEN- denies fatigue, fever, weight loss,weakness, recent illness HEENT- denies eye drainage, change in vision, nasal discharge, CVS- denies chest pain, palpitations RESP- denies SOB, cough, wheeze ABD- denies N/V, change in stools, abd pain GU- denies dysuria, hematuria, dribbling, incontinence MSK- denies joint pain, muscle aches, injury Neuro- denies headache, dizziness, syncope, seizure activity  Physical: Vitals reviewed  GEN- NAD, alert and oriented x3 HEENT- PERRL, EOMI, non injected sclera, pink conjunctiva, MMM, oropharynx clear Neck- Supple, no thryomegaly, no  carotid bruit CVS- RRR, no murmur RESP-CTAB Abdo NABS soft nontender nondistended EXT- No edema Pulses- Radial, DP- 2+   Assessment:    Annual wellness medicare exam   Plan:    During the course of the visit the patient was educated and counseled about appropriate screening and preventive services including:   Fall/depression screen/CAGE screen negative  Diabetes mellitus currently on low-dose of Metformin however increased blood sugars recently.  We will recheck her A1c goal is less than 7% need to increase her Metformin 500 mg twice daily.  Restless leg syndrome based on history.  She may benefit from a low-dose of Requip we will see what her labs look like first.  Hypomagnesium we will recheck her magnesium level to ensure that this does not need to be repleted further before starting her on something for the restless legs  Hypertension blood pressure is improved today does not have any significant fluid overload  Atrial fibrillation rate controlled followed by cardiology on blood thinner  Discussed COVID-19 vaccine  She has been on estrogen for quite some time.  Will schedule with GYN to see if this can be reduced she is also due for mammogram which she gets done with her GYN          .  Diet review for nutrition referral? Yes ____ Not Indicated __x__  Patient Instructions (the written plan) was given to the patient.  Medicare Attestation  I have personally reviewed:  The patient's medical and social history  Their use of alcohol, tobacco or illicit drugs  Their current medications and supplements  The patient's functional ability including ADLs,fall risks, home safety risks, cognitive, and hearing and visual impairment  Diet and physical activities  Evidence for depression or mood disorders  The patient's weight, height, BMI, and visual acuity have been recorded in the chart. I have made referrals, counseling, and provided education to the patient based on  review of the above and I have provided the patient with a written personalized care plan for preventive services.

## 2019-11-05 NOTE — Patient Instructions (Addendum)
COVID Vaccination Information As of right now, we will not be giving COVID-19 vaccines here in our office. It is too many storage and administrating regulations that our office is not equipped to provide at this time.    You can go online at [email or web address]   That website will give you information on all counties.    If not here are the numbers you can call. [place] or [place] [phone] opt.2 [facility] Department [phone]   You can also find information on [email or web address] or call the state's COVID-19 information phone number at 211.   You can also find information in regards to local pharmacies at: [email or web address]  Schedule with GYN- Dr. Philis Pique need Mammogram and discuss hormone therapy  We will call with A1C result and medicine for Restless leg syndrome   F/U 4 months

## 2019-11-06 ENCOUNTER — Other Ambulatory Visit: Payer: Self-pay | Admitting: *Deleted

## 2019-11-06 LAB — HEMOGLOBIN A1C
Hgb A1c MFr Bld: 8.3 % of total Hgb — ABNORMAL HIGH (ref <5.7)
Mean Plasma Glucose: 192 (calc)
eAG (mmol/L): 10.6 (calc)

## 2019-11-06 LAB — MICROALBUMIN / CREATININE URINE RATIO
Creatinine, Urine: 117 mg/dL (ref 20–275)
Microalb Creat Ratio: 514 mcg/mg creat — ABNORMAL HIGH (ref <30)
Microalb, Ur: 60.1 mg/dL

## 2019-11-06 LAB — MAGNESIUM: Magnesium: 1 mg/dL — CL (ref 1.5–2.5)

## 2019-11-06 MED ORDER — METFORMIN HCL 500 MG PO TABS: 500.0000 mg | ORAL_TABLET | Freq: Two times a day (BID) | ORAL | 3 refills | Status: DC

## 2019-11-06 MED ORDER — MAGNESIUM OXIDE 400 MG PO CAPS: 2.0000 | ORAL_CAPSULE | Freq: Two times a day (BID) | ORAL | 11 refills | Status: DC

## 2019-11-13 ENCOUNTER — Encounter: Payer: Self-pay | Admitting: Cardiovascular Disease

## 2019-11-13 ENCOUNTER — Ambulatory Visit (INDEPENDENT_AMBULATORY_CARE_PROVIDER_SITE_OTHER): Payer: PPO | Admitting: Cardiovascular Disease

## 2019-11-13 ENCOUNTER — Other Ambulatory Visit: Payer: Self-pay

## 2019-11-13 DIAGNOSIS — I495 Sick sinus syndrome: Secondary | ICD-10-CM

## 2019-11-13 DIAGNOSIS — I10 Essential (primary) hypertension: Secondary | ICD-10-CM | POA: Diagnosis not present

## 2019-11-13 DIAGNOSIS — E782 Mixed hyperlipidemia: Secondary | ICD-10-CM | POA: Diagnosis not present

## 2019-11-13 NOTE — Assessment & Plan Note (Signed)
History of essential hypertension with blood pressure measured today 154/88.  She is on carvedilol, diltiazem and losartan.

## 2019-11-13 NOTE — Assessment & Plan Note (Signed)
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 Caryl Comes 06/11/2019 with marked improvement in her clinical symptoms.  She is on Eliquis oral anticoagulation.

## 2019-11-13 NOTE — Assessment & Plan Note (Signed)
History of hyperlipidemia on statin therapy with lipid profile performed 07/04/2019 revealing total cholesterol 189, LDL of 89 and HDL of 78.

## 2019-11-13 NOTE — Patient Instructions (Signed)

## 2019-11-13 NOTE — Progress Notes (Signed)
11/13/2019 SOLARIS KOZLOFF   [birth year]  IT:[id number]  Primary Physician Alycia Rossetti, MD Primary Cardiologist: Lorretta Harp MD [name], [place]  HPI:  [name] is a 74 y.o.  moderately overweight divorced Caucasian female mother of 1 daughter, grandmother 3 grandchildren referred from her hospitalization to be established in my practice. Her primary care provider is Dr. Vic Blackbird. 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  She retired 2 years ago.  I last saw her in the office 05/17/2019.Her risk factor profile is notable for remote tobacco abuse, family history with mother and father both who have had ischemic heart disease as well as treated hypertension, diabetes and hyperlipidemia. She had been feeling sick with nausea vomiting and dehydration prior to her admission 03/02/2014. She was discharged 4 days later. She did have a 2D echo that was essentially normal and a Myoview stress test that was normal as well. She denies chest pain or shortness of breath. She was discharged home on Eliquis oral anticoagulation. She is been fairly asymptomatic since.  I did place a 2-week ZIO patch on her which revealed sinus rhythm/sinus bradycardia, PVCs with episodes of nonsustained ventricular tachycardia and pauses up to 4 seconds.  She is on diltiazem and metoprolol.  She has complained of episodes of dizziness but no frank syncope.  I referred her to Dr. Caryl Comes who placed a permanent transvenous pacemaker on 06/11/2019.  Since that time she is felt clinically improved.    Current Meds  Medication Sig  . apixaban (ELIQUIS) 5 MG TABS tablet Take 1 tablet (5 mg total) by mouth 2 (two) times daily.  Marland Kitchen atorvastatin (LIPITOR) 10 MG tablet TAKE 1 TABLET BY MOUTH EVERY DAY  . Calcium Carbonate-Vitamin D (CALCIUM 600 + D PO) Take 1 tablet by mouth 2 (two) times daily.  . carvedilol (COREG) 12.5 MG tablet Take 1 tablet (12.5 mg total) by mouth 2 (two) times  daily with a meal.  . diclofenac sodium (VOLTAREN) 1 % GEL Apply 3 g topically daily as needed (to painful sites (in 3 large joints)).   Marland Kitchen diltiazem (CARDIZEM CD) 360 MG 24 hr capsule TAKE 1 CAPSULE BY MOUTH EVERY DAY  . estradiol (ESTRACE) 0.5 MG tablet Take 0.5 mg by mouth 2 (two) times a day.  . etanercept (ENBREL SURECLICK) 50 MG/ML injection INJECT 50 MG (0.98 ML) UNDER THE SKIN ONCE A WEEK  . fish oil-omega-3 fatty acids 1000 MG capsule Take 1 g by mouth at bedtime.   . folic acid (FOLVITE) 1 MG tablet TAKE 1 TABLET BY MOUTH TWICE A DAY  . furosemide (LASIX) 40 MG tablet Take 1 tablet daily by mouth until swelling resolves then begin taking 1 tablet by mouth every other day.  . leflunomide (ARAVA) 10 MG tablet TAKE 1 TABLET BY MOUTH EVERY DAY  . levothyroxine (SYNTHROID) 50 MCG tablet TAKE 1 TABLET BY MOUTH DAILY BEFORE BREAKFAST  . losartan (COZAAR) 50 MG tablet Take 1 tablet (50 mg total) by mouth daily.  . Magnesium Oxide 400 MG CAPS Take 2 capsules (800 mg total) by mouth 2 (two) times daily.  . metFORMIN (GLUCOPHAGE) 500 MG tablet Take 1 tablet (500 mg total) by mouth 2 (two) times daily with a meal.  . methocarbamol (ROBAXIN) 500 MG tablet Take 1 tablet (500 mg total) by mouth daily as needed for muscle spasms.  . Multiple Vitamins-Minerals (MULTIVITAMINS THER. W/MINERALS) TABS Take 1 tablet by mouth  daily.  . pantoprazole (PROTONIX) 40 MG tablet TAKE 1 TABLET BY MOUTH EVERY DAY  . potassium chloride (KLOR-CON) 10 MEQ tablet Take 1 tablet (10 mEq total) by mouth daily.  . vitamin B-12 (CYANOCOBALAMIN) 500 MCG tablet Take 500 mcg by mouth daily.     Allergies  Allergen Reactions  . Monopril [Fosinopril] Cough  . Magnesium-Containing Compounds Nausea Only and Other (See Comments)    Re: Mag-Ox at higher doses caused a lot of stomach upset (dose had to be decreased)  . Norvasc [Amlodipine Besylate] Other (See Comments)    10 mg dose caused LE edema- can tolerate 5 mg dose     Social History   Socioeconomic History  . Marital status: Divorced    Spouse name: Not on file  . Number of children: Not on file  . Years of education: Not on file  . Highest education level: Not on file  Occupational History  . Not on file  Tobacco Use  . Smoking status: Former Smoker    Packs/day: 0.25    Years: 2.00    Pack years: 0.50    Types: Cigarettes  . Smokeless tobacco: Never Used  Substance and Sexual Activity  . Alcohol use: Yes    Comment: occasional  . Drug use: Never  . Sexual activity: Never  Other Topics Concern  . Not on file  Social History Narrative  . Not on file   Social Determinants of Health   Financial Resource Strain:   . Difficulty of Paying Living Expenses:   Food Insecurity:   . Worried About Charity fundraiser in the Last Year:   . Arboriculturist in the Last Year:   Transportation Needs:   . Film/video editor (Medical):   Marland Kitchen Lack of Transportation (Non-Medical):   Physical Activity:   . Days of Exercise per Week:   . Minutes of Exercise per Session:   Stress:   . Feeling of Stress :   Social Connections:   . Frequency of Communication with Friends and Family:   . Frequency of Social Gatherings with Friends and Family:   . Attends Religious Services:   . Active Member of Clubs or Organizations:   . Attends Archivist Meetings:   Marland Kitchen Marital Status:   Intimate Partner Violence:   . Fear of Current or Ex-Partner:   . Emotionally Abused:   Marland Kitchen Physically Abused:   . Sexually Abused:      Review of Systems: General: negative for chills, fever, night sweats or weight changes.  Cardiovascular: negative for chest pain, dyspnea on exertion, edema, orthopnea, palpitations, paroxysmal nocturnal dyspnea or shortness of breath Dermatological: negative for rash Respiratory: negative for cough or wheezing Urologic: negative for hematuria Abdominal: negative for nausea, vomiting, diarrhea, bright red blood per rectum,  melena, or hematemesis Neurologic: negative for visual changes, syncope, or dizziness All other systems reviewed and are otherwise negative except as noted above.    Blood pressure (!) 154/88, pulse 81, height 4\' 10"  (1.473 m), weight 187 lb 6.4 oz (85 kg), SpO2 95 %.  General appearance: alert and no distress Neck: no adenopathy, no carotid bruit, no JVD, supple, symmetrical, trachea midline and thyroid not enlarged, symmetric, no tenderness/mass/nodules Lungs: clear to auscultation bilaterally Heart: regular rate and rhythm, S1, S2 normal, no murmur, click, rub or gallop Extremities: extremities normal, atraumatic, no cyanosis or edema Pulses: 2+ and symmetric Skin: Skin color, texture, turgor normal. No rashes or lesions Neurologic: Alert and  oriented X 3, normal strength and tone. Normal symmetric reflexes. Normal coordination and gait  EKG not performed today  ASSESSMENT AND PLAN:   Hyperlipidemia History of hyperlipidemia on statin therapy with lipid profile performed 07/04/2019 revealing total cholesterol 189, LDL of 89 and HDL of 78.  Essential hypertension, benign History of essential hypertension with blood pressure measured today 154/88.  She is on carvedilol, diltiazem and losartan.  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 Caryl Comes 06/11/2019 with marked improvement in her clinical symptoms.  She is on Eliquis oral anticoagulation.      Lorretta Harp MD FACP,FACC,FAHA, [facility] 11/13/2019 11:16 AM

## 2019-11-21 ENCOUNTER — Other Ambulatory Visit: Payer: PPO

## 2019-11-21 ENCOUNTER — Other Ambulatory Visit: Payer: Self-pay

## 2019-11-21 LAB — BASIC METABOLIC PANEL
BUN/Creatinine Ratio: 22 (calc) (ref 6–22)
BUN: 24 mg/dL (ref 7–25)
CO2: 30 mmol/L (ref 20–32)
Calcium: 9.3 mg/dL (ref 8.6–10.4)
Chloride: 102 mmol/L (ref 98–110)
Creat: 1.08 mg/dL — ABNORMAL HIGH (ref 0.60–0.93)
Glucose, Bld: 152 mg/dL — ABNORMAL HIGH (ref 65–99)
Potassium: 3.9 mmol/L (ref 3.5–5.3)
Sodium: 141 mmol/L (ref 135–146)

## 2019-11-21 LAB — MAGNESIUM: Magnesium: 1.2 mg/dL — ABNORMAL LOW (ref 1.5–2.5)

## 2019-11-22 ENCOUNTER — Other Ambulatory Visit: Payer: Self-pay | Admitting: Family Medicine

## 2019-11-22 MED ORDER — MAGNESIUM OXIDE 400 MG PO CAPS: 3.0000 | ORAL_CAPSULE | Freq: Two times a day (BID) | ORAL | 11 refills | Status: DC

## 2019-11-23 ENCOUNTER — Other Ambulatory Visit: Payer: Self-pay | Admitting: Cardiovascular Disease

## 2019-11-23 NOTE — Telephone Encounter (Signed)
73 F 85 kg, SCr 1.08 (3/21), LOV 3/21 [name]

## 2019-11-26 ENCOUNTER — Ambulatory Visit (INDEPENDENT_AMBULATORY_CARE_PROVIDER_SITE_OTHER): Payer: PPO | Admitting: *Deleted

## 2019-11-26 DIAGNOSIS — I455 Other specified heart block: Secondary | ICD-10-CM

## 2019-11-27 ENCOUNTER — Telehealth: Payer: Self-pay | Admitting: *Deleted

## 2019-11-27 NOTE — Telephone Encounter (Addendum)
Received call from patient.   Reports that she was exposed to COVID positive person. Reports that she was tested and noted to be positive as well.   Reports that she has slight fatigue and cough, but no other Sx.   Advised if Sx worsen, contact office. Advised if extreme SOB noted, go to ER for eval.

## 2019-11-27 NOTE — Telephone Encounter (Signed)
Noted  

## 2019-11-28 ENCOUNTER — Telehealth: Payer: Self-pay | Admitting: Rheumatology

## 2019-11-28 LAB — CUP PACEART REMOTE DEVICE CHECK
Battery Remaining Longevity: 174 mo
Battery Voltage: 3.1 V
Brady Statistic AP VP Percent: 0.01 %
Brady Statistic AP VS Percent: 3.02 %
Brady Statistic AS VP Percent: 0.04 %
Brady Statistic AS VS Percent: 96.94 %
Brady Statistic RA Percent Paced: 3.34 %
Brady Statistic RV Percent Paced: 0.05 %
Date Time Interrogation Session: 20210405100658
Implantable Lead Implant Date: 20201019
Implantable Lead Implant Date: 20201019
Implantable Lead Location: 753859
Implantable Lead Location: 753860
Implantable Lead Model: 5076
Implantable Lead Model: 5076
Implantable Pulse Generator Implant Date: 20201019
Lead Channel Impedance Value: 418 Ohm
Lead Channel Impedance Value: 475 Ohm
Lead Channel Impedance Value: 494 Ohm
Lead Channel Impedance Value: 627 Ohm
Lead Channel Pacing Threshold Amplitude: 0.5 V
Lead Channel Pacing Threshold Amplitude: 0.875 V
Lead Channel Pacing Threshold Pulse Width: 0.4 ms
Lead Channel Pacing Threshold Pulse Width: 0.4 ms
Lead Channel Sensing Intrinsic Amplitude: 12.625 mV
Lead Channel Sensing Intrinsic Amplitude: 12.625 mV
Lead Channel Sensing Intrinsic Amplitude: 4.375 mV
Lead Channel Sensing Intrinsic Amplitude: 4.375 mV
Lead Channel Setting Pacing Amplitude: 1.5 V
Lead Channel Setting Pacing Amplitude: 2.5 V
Lead Channel Setting Pacing Pulse Width: 0.4 ms
Lead Channel Setting Sensing Sensitivity: 1.2 mV

## 2019-11-28 MED ORDER — ENBREL SURECLICK 50 MG/ML ~~LOC~~ SOAJ: SUBCUTANEOUS | 0 refills | Status: AC

## 2019-11-28 NOTE — Telephone Encounter (Signed)
[name] from [facility] called requesting prescription refill of Enbrel 50 mg for the patient.   Please fax prescription to [phone] or call with verbal at [phone]

## 2019-11-28 NOTE — Telephone Encounter (Signed)
Last Visit: 10/31/19 Next Visit: 04/01/20 Labs: 10/31/19 CBC WNL. Creatinine remains borderline elevated. GFR stable-53. Glucose is very elevated-379. TB Gold: 05/11/19 Neg   Current Dose per office note on [id number]: Enbrel SureClick 50 mg every 7 days   Okay to refill per Dr. Estanislado Pandy

## 2019-12-03 ENCOUNTER — Other Ambulatory Visit: Payer: Self-pay

## 2019-12-03 ENCOUNTER — Emergency Department (HOSPITAL_COMMUNITY): Payer: PPO

## 2019-12-03 ENCOUNTER — Inpatient Hospital Stay (HOSPITAL_COMMUNITY)
Admission: EM | Admit: 2019-12-03 | Discharge: 2019-12-10 | DRG: 177 | Disposition: A | Discharge status: Home Health | Payer: PPO | Attending: Internal Medicine | Admitting: Internal Medicine

## 2019-12-03 ENCOUNTER — Encounter (HOSPITAL_COMMUNITY): Payer: Self-pay | Admitting: Emergency Medicine

## 2019-12-03 DIAGNOSIS — Z7984 Long term (current) use of oral hypoglycemic drugs: Secondary | ICD-10-CM | POA: Diagnosis not present

## 2019-12-03 DIAGNOSIS — Z888 Allergy status to other drugs, medicaments and biological substances status: Secondary | ICD-10-CM

## 2019-12-03 DIAGNOSIS — I480 Paroxysmal atrial fibrillation: Secondary | ICD-10-CM | POA: Diagnosis not present

## 2019-12-03 DIAGNOSIS — E782 Mixed hyperlipidemia: Secondary | ICD-10-CM

## 2019-12-03 DIAGNOSIS — Z950 Presence of cardiac pacemaker: Secondary | ICD-10-CM | POA: Diagnosis not present

## 2019-12-03 DIAGNOSIS — Z79890 Hormone replacement therapy (postmenopausal): Secondary | ICD-10-CM

## 2019-12-03 DIAGNOSIS — K219 Gastro-esophageal reflux disease without esophagitis: Secondary | ICD-10-CM | POA: Diagnosis not present

## 2019-12-03 DIAGNOSIS — K760 Fatty (change of) liver, not elsewhere classified: Secondary | ICD-10-CM | POA: Diagnosis not present

## 2019-12-03 DIAGNOSIS — R05 Cough: Secondary | ICD-10-CM | POA: Diagnosis not present

## 2019-12-03 DIAGNOSIS — E1165 Type 2 diabetes mellitus with hyperglycemia: Secondary | ICD-10-CM | POA: Diagnosis not present

## 2019-12-03 DIAGNOSIS — Z833 Family history of diabetes mellitus: Secondary | ICD-10-CM

## 2019-12-03 DIAGNOSIS — Z6839 Body mass index (BMI) 39.0-39.9, adult: Secondary | ICD-10-CM

## 2019-12-03 DIAGNOSIS — R0602 Shortness of breath: Secondary | ICD-10-CM | POA: Diagnosis not present

## 2019-12-03 DIAGNOSIS — E785 Hyperlipidemia, unspecified: Secondary | ICD-10-CM | POA: Diagnosis present

## 2019-12-03 DIAGNOSIS — D84821 Immunodeficiency due to drugs: Secondary | ICD-10-CM

## 2019-12-03 DIAGNOSIS — I10 Essential (primary) hypertension: Secondary | ICD-10-CM | POA: Diagnosis not present

## 2019-12-03 DIAGNOSIS — I495 Sick sinus syndrome: Secondary | ICD-10-CM

## 2019-12-03 DIAGNOSIS — U071 COVID-19: Secondary | ICD-10-CM | POA: Diagnosis not present

## 2019-12-03 DIAGNOSIS — Z7901 Long term (current) use of anticoagulants: Secondary | ICD-10-CM

## 2019-12-03 DIAGNOSIS — N289 Disorder of kidney and ureter, unspecified: Secondary | ICD-10-CM | POA: Diagnosis not present

## 2019-12-03 DIAGNOSIS — D849 Immunodeficiency, unspecified: Secondary | ICD-10-CM | POA: Diagnosis present

## 2019-12-03 DIAGNOSIS — Z79899 Other long term (current) drug therapy: Secondary | ICD-10-CM

## 2019-12-03 DIAGNOSIS — E039 Hypothyroidism, unspecified: Secondary | ICD-10-CM | POA: Diagnosis present

## 2019-12-03 DIAGNOSIS — J9610 Chronic respiratory failure, unspecified whether with hypoxia or hypercapnia: Secondary | ICD-10-CM | POA: Diagnosis present

## 2019-12-03 DIAGNOSIS — Z8249 Family history of ischemic heart disease and other diseases of the circulatory system: Secondary | ICD-10-CM | POA: Diagnosis not present

## 2019-12-03 DIAGNOSIS — Z87891 Personal history of nicotine dependence: Secondary | ICD-10-CM | POA: Diagnosis not present

## 2019-12-03 DIAGNOSIS — E669 Obesity, unspecified: Secondary | ICD-10-CM | POA: Diagnosis present

## 2019-12-03 DIAGNOSIS — Z96653 Presence of artificial knee joint, bilateral: Secondary | ICD-10-CM | POA: Diagnosis not present

## 2019-12-03 DIAGNOSIS — M0579 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 without organ or systems involvement: Secondary | ICD-10-CM | POA: Diagnosis not present

## 2019-12-03 DIAGNOSIS — R0902 Hypoxemia: Principal | ICD-10-CM

## 2019-12-03 DIAGNOSIS — J9601 Acute respiratory failure with hypoxia: Secondary | ICD-10-CM | POA: Diagnosis present

## 2019-12-03 DIAGNOSIS — IMO0002 Reserved for concepts with insufficient information to code with codable children: Secondary | ICD-10-CM | POA: Diagnosis present

## 2019-12-03 LAB — COMPREHENSIVE METABOLIC PANEL
ALT: 23 U/L (ref 0–44)
AST: 43 U/L — ABNORMAL HIGH (ref 15–41)
Albumin: 2.6 g/dL — ABNORMAL LOW (ref 3.5–5.0)
Alkaline Phosphatase: 93 U/L (ref 38–126)
Anion gap: 10 (ref 5–15)
BUN: 10 mg/dL (ref 8–23)
CO2: 29 mmol/L (ref 22–32)
Calcium: 8.6 mg/dL — ABNORMAL LOW (ref 8.9–10.3)
Chloride: 102 mmol/L (ref 98–111)
Creatinine, Ser: 1.12 mg/dL — ABNORMAL HIGH (ref 0.44–1.00)
GFR calc Af Amer: 56 mL/min — ABNORMAL LOW (ref >60)
GFR calc non Af Amer: 48 mL/min — ABNORMAL LOW (ref >60)
Glucose, Bld: 154 mg/dL — ABNORMAL HIGH (ref 70–99)
Potassium: 3.7 mmol/L (ref 3.5–5.1)
Sodium: 141 mmol/L (ref 135–145)
Total Bilirubin: 0.6 mg/dL (ref 0.3–1.2)
Total Protein: 6.2 g/dL — ABNORMAL LOW (ref 6.5–8.1)

## 2019-12-03 LAB — URINALYSIS, ROUTINE W REFLEX MICROSCOPIC
Bilirubin Urine: NEGATIVE
Glucose, UA: NEGATIVE mg/dL
Hgb urine dipstick: NEGATIVE
Ketones, ur: NEGATIVE mg/dL
Nitrite: NEGATIVE
Protein, ur: 100 mg/dL — AB
Specific Gravity, Urine: 1.025 (ref 1.005–1.030)
pH: 5 (ref 5.0–8.0)

## 2019-12-03 LAB — CBC
HCT: 42.2 % (ref 36.0–46.0)
Hemoglobin: 13.2 g/dL (ref 12.0–15.0)
MCH: 31.4 pg (ref 26.0–34.0)
MCHC: 31.3 g/dL (ref 30.0–36.0)
MCV: 100.5 fL — ABNORMAL HIGH (ref 80.0–100.0)
Platelets: 131 10*3/uL — ABNORMAL LOW (ref 150–400)
RBC: 4.2 MIL/uL (ref 3.87–5.11)
RDW: 15 % (ref 11.5–15.5)
WBC: 5.9 10*3/uL (ref 4.0–10.5)
nRBC: 0 % (ref 0.0–0.2)

## 2019-12-03 LAB — SARS CORONAVIRUS 2 (TAT 6-24 HRS): SARS Coronavirus 2: POSITIVE — AB

## 2019-12-03 LAB — D-DIMER, QUANTITATIVE: D-Dimer, Quant: 2.93 ug/mL-FEU — ABNORMAL HIGH (ref 0.00–0.50)

## 2019-12-03 LAB — C-REACTIVE PROTEIN: CRP: 9.2 mg/dL — ABNORMAL HIGH (ref <1.0)

## 2019-12-03 LAB — HEPATITIS B SURFACE ANTIGEN: Hepatitis B Surface Ag: NONREACTIVE

## 2019-12-03 LAB — LACTATE DEHYDROGENASE: LDH: 294 U/L — ABNORMAL HIGH (ref 98–192)

## 2019-12-03 LAB — FERRITIN: Ferritin: 1226 ng/mL — ABNORMAL HIGH (ref 11–307)

## 2019-12-03 LAB — CBG MONITORING, ED
Glucose-Capillary: 102 mg/dL — ABNORMAL HIGH (ref 70–99)
Glucose-Capillary: 180 mg/dL — ABNORMAL HIGH (ref 70–99)

## 2019-12-03 LAB — LIPASE, BLOOD: Lipase: 17 U/L (ref 11–51)

## 2019-12-03 LAB — BRAIN NATRIURETIC PEPTIDE: B Natriuretic Peptide: 142.5 pg/mL — ABNORMAL HIGH (ref 0.0–100.0)

## 2019-12-03 LAB — FIBRINOGEN: Fibrinogen: 644 mg/dL — ABNORMAL HIGH (ref 210–475)

## 2019-12-03 LAB — PROCALCITONIN: Procalcitonin: <0.1 ng/mL

## 2019-12-03 MED ORDER — PANTOPRAZOLE SODIUM 40 MG PO TBEC
40.0000 mg | DELAYED_RELEASE_TABLET | Freq: Every day | ORAL | Status: DC
  Administered 2019-12-03 – 2019-12-10 (×8): 40 mg via ORAL
  Filled 2019-12-03 (×8): qty 1

## 2019-12-03 MED ORDER — ATORVASTATIN CALCIUM 10 MG PO TABS
10.0000 mg | ORAL_TABLET | Freq: Every day | ORAL | Status: DC
  Administered 2019-12-03 – 2019-12-10 (×8): 10 mg via ORAL
  Filled 2019-12-03 (×8): qty 1

## 2019-12-03 MED ORDER — MAGNESIUM OXIDE 400 (241.3 MG) MG PO TABS
1200.0000 mg | ORAL_TABLET | Freq: Two times a day (BID) | ORAL | Status: DC
  Administered 2019-12-03 – 2019-12-05 (×4): 1200 mg via ORAL
  Filled 2019-12-03 (×4): qty 3

## 2019-12-03 MED ORDER — DILTIAZEM HCL ER COATED BEADS 180 MG PO CP24
360.0000 mg | ORAL_CAPSULE | Freq: Every day | ORAL | Status: DC
  Administered 2019-12-04 – 2019-12-10 (×7): 360 mg via ORAL
  Filled 2019-12-03: qty 3
  Filled 2019-12-03 (×2): qty 2
  Filled 2019-12-03: qty 1
  Filled 2019-12-03 (×2): qty 2
  Filled 2019-12-03: qty 3
  Filled 2019-12-03 (×2): qty 2

## 2019-12-03 MED ORDER — POTASSIUM CHLORIDE CRYS ER 10 MEQ PO TBCR
10.0000 meq | EXTENDED_RELEASE_TABLET | Freq: Every day | ORAL | Status: DC
  Administered 2019-12-03 – 2019-12-10 (×7): 10 meq via ORAL
  Filled 2019-12-03 (×9): qty 1

## 2019-12-03 MED ORDER — ASCORBIC ACID 500 MG PO TABS
500.0000 mg | ORAL_TABLET | Freq: Every day | ORAL | Status: DC
  Administered 2019-12-03 – 2019-12-10 (×8): 500 mg via ORAL
  Filled 2019-12-03 (×8): qty 1

## 2019-12-03 MED ORDER — HYDROCOD POLST-CPM POLST ER 10-8 MG/5ML PO SUER
5.0000 mL | Freq: Two times a day (BID) | ORAL | Status: DC | PRN
  Administered 2019-12-03 – 2019-12-09 (×7): 5 mL via ORAL
  Filled 2019-12-03 (×7): qty 5

## 2019-12-03 MED ORDER — CARVEDILOL 12.5 MG PO TABS
12.5000 mg | ORAL_TABLET | Freq: Two times a day (BID) | ORAL | Status: DC
  Administered 2019-12-04 – 2019-12-10 (×13): 12.5 mg via ORAL
  Filled 2019-12-03 (×14): qty 1

## 2019-12-03 MED ORDER — SODIUM CHLORIDE 0.9% FLUSH: 3.0000 mL | Freq: Once | INTRAVENOUS | Status: DC

## 2019-12-03 MED ORDER — METHOCARBAMOL 500 MG PO TABS: 500.0000 mg | ORAL_TABLET | Freq: Every day | ORAL | Status: DC | PRN

## 2019-12-03 MED ORDER — INSULIN ASPART 100 UNIT/ML ~~LOC~~ SOLN
0.0000 [IU] | Freq: Every day | SUBCUTANEOUS | Status: DC
  Administered 2019-12-04 – 2019-12-06 (×3): 2 [IU] via SUBCUTANEOUS
  Administered 2019-12-07: 3 [IU] via SUBCUTANEOUS
  Administered 2019-12-08 – 2019-12-09 (×2): 4 [IU] via SUBCUTANEOUS

## 2019-12-03 MED ORDER — SODIUM CHLORIDE 0.9 % IV SOLN
200.0000 mg | Freq: Once | INTRAVENOUS | Status: AC
  Administered 2019-12-03: 200 mg via INTRAVENOUS
  Filled 2019-12-03: qty 40

## 2019-12-03 MED ORDER — ACETAMINOPHEN 325 MG PO TABS
650.0000 mg | ORAL_TABLET | Freq: Four times a day (QID) | ORAL | Status: DC | PRN
  Administered 2019-12-03 – 2019-12-06 (×2): 650 mg via ORAL
  Filled 2019-12-03 (×2): qty 2

## 2019-12-03 MED ORDER — LOSARTAN POTASSIUM 50 MG PO TABS
50.0000 mg | ORAL_TABLET | Freq: Every day | ORAL | Status: DC
  Administered 2019-12-03 – 2019-12-10 (×8): 50 mg via ORAL
  Filled 2019-12-03 (×8): qty 1

## 2019-12-03 MED ORDER — SODIUM CHLORIDE 0.9% FLUSH
3.0000 mL | Freq: Two times a day (BID) | INTRAVENOUS | Status: DC
  Administered 2019-12-03 – 2019-12-10 (×14): 3 mL via INTRAVENOUS

## 2019-12-03 MED ORDER — GUAIFENESIN-DM 100-10 MG/5ML PO SYRP
10.0000 mL | ORAL_SOLUTION | ORAL | Status: DC | PRN
  Administered 2019-12-05 – 2019-12-09 (×6): 10 mL via ORAL
  Filled 2019-12-03 (×7): qty 10

## 2019-12-03 MED ORDER — FAMOTIDINE 20 MG PO TABS: 20.0000 mg | ORAL_TABLET | Freq: Two times a day (BID) | ORAL | Status: DC

## 2019-12-03 MED ORDER — SODIUM CHLORIDE 0.9 % IV SOLN: INTRAVENOUS | Status: AC

## 2019-12-03 MED ORDER — APIXABAN 5 MG PO TABS
5.0000 mg | ORAL_TABLET | Freq: Two times a day (BID) | ORAL | Status: DC
  Administered 2019-12-04 – 2019-12-10 (×13): 5 mg via ORAL
  Filled 2019-12-03 (×15): qty 1

## 2019-12-03 MED ORDER — ZINC SULFATE 220 (50 ZN) MG PO CAPS
220.0000 mg | ORAL_CAPSULE | Freq: Every day | ORAL | Status: DC
  Administered 2019-12-03 – 2019-12-10 (×8): 220 mg via ORAL
  Filled 2019-12-03 (×8): qty 1

## 2019-12-03 MED ORDER — FUROSEMIDE 40 MG PO TABS
40.0000 mg | ORAL_TABLET | ORAL | Status: DC
  Administered 2019-12-04 – 2019-12-10 (×4): 40 mg via ORAL
  Filled 2019-12-03 (×3): qty 1
  Filled 2019-12-03: qty 2
  Filled 2019-12-03: qty 1
  Filled 2019-12-03: qty 2
  Filled 2019-12-03 (×2): qty 1

## 2019-12-03 MED ORDER — LEVOTHYROXINE SODIUM 50 MCG PO TABS
50.0000 ug | ORAL_TABLET | Freq: Every day | ORAL | Status: DC
  Administered 2019-12-04 – 2019-12-10 (×7): 50 ug via ORAL
  Filled 2019-12-03 (×7): qty 1

## 2019-12-03 MED ORDER — LEFLUNOMIDE 20 MG PO TABS
10.0000 mg | ORAL_TABLET | Freq: Every day | ORAL | Status: DC
  Administered 2019-12-04 – 2019-12-05 (×2): 10 mg via ORAL
  Filled 2019-12-03 (×4): qty 0.5

## 2019-12-03 MED ORDER — DEXAMETHASONE 6 MG PO TABS
6.0000 mg | ORAL_TABLET | ORAL | Status: DC
  Administered 2019-12-03 – 2019-12-07 (×5): 6 mg via ORAL
  Filled 2019-12-03: qty 1
  Filled 2019-12-03: qty 2
  Filled 2019-12-03 (×3): qty 1

## 2019-12-03 MED ORDER — INSULIN ASPART 100 UNIT/ML ~~LOC~~ SOLN
0.0000 [IU] | Freq: Three times a day (TID) | SUBCUTANEOUS | Status: DC
  Administered 2019-12-04: 2 [IU] via SUBCUTANEOUS
  Administered 2019-12-04 (×2): 3 [IU] via SUBCUTANEOUS
  Administered 2019-12-05: 8 [IU] via SUBCUTANEOUS
  Administered 2019-12-05: 3 [IU] via SUBCUTANEOUS
  Administered 2019-12-05: 5 [IU] via SUBCUTANEOUS
  Administered 2019-12-06: 11 [IU] via SUBCUTANEOUS
  Administered 2019-12-06: 5 [IU] via SUBCUTANEOUS
  Administered 2019-12-06: 8 [IU] via SUBCUTANEOUS
  Administered 2019-12-07: 11 [IU] via SUBCUTANEOUS
  Administered 2019-12-07: 8 [IU] via SUBCUTANEOUS
  Administered 2019-12-07: 11 [IU] via SUBCUTANEOUS
  Administered 2019-12-08: 15 [IU] via SUBCUTANEOUS
  Administered 2019-12-08: 8 [IU] via SUBCUTANEOUS
  Administered 2019-12-08: 11 [IU] via SUBCUTANEOUS
  Administered 2019-12-09 (×2): 5 [IU] via SUBCUTANEOUS
  Administered 2019-12-09: 8 [IU] via SUBCUTANEOUS
  Administered 2019-12-10: 5 [IU] via SUBCUTANEOUS
  Administered 2019-12-10: 12:00:00 8 [IU] via SUBCUTANEOUS

## 2019-12-03 MED ORDER — DICLOFENAC SODIUM 1 % EX GEL
4.0000 g | Freq: Every day | CUTANEOUS | Status: DC | PRN
  Filled 2019-12-03: qty 100

## 2019-12-03 MED ORDER — SODIUM CHLORIDE 0.9 % IV SOLN
100.0000 mg | Freq: Every day | INTRAVENOUS | Status: AC
  Administered 2019-12-04 – 2019-12-07 (×4): 100 mg via INTRAVENOUS
  Filled 2019-12-03: qty 20
  Filled 2019-12-03: qty 100
  Filled 2019-12-03 (×2): qty 20

## 2019-12-03 NOTE — ED Notes (Signed)
Ask patient for urine sample, patient stated that she did not need to urinate at this time. 

## 2019-12-03 NOTE — H&P (Signed)
History and Physical    [name] [id number] DOB: March 12, 1946 DOA: 12/03/2019  Referring MD/NP/PA: Rolla Plate PCP: Alycia Rossetti, MD  Patient coming from: Home  Chief Complaint: shortness of breath and cough  I have personally briefly reviewed patient's old medical records in [place]   HPI: [name] is a 74 y.o. female with medical history significant of paroxysmal atrial fibrillation on chronic anticoagulation, hypothyroidism, DM type II, rheumatoid arthritis, GERD, and Covid -19 positive initially diagnosed on 4/3 after close contact.  She presents with complaints of progressively worsening cough and shortness of breath over the last 9 days.  Cough is mostly nonproductive.  Notes that she has had associated symptoms of subjective fever, chills, generalized malaise, sore throat, runny nose, body aches, nausea, one episode of vomiting, and diarrhea. She had tried taking DayQuil and NyQuil with only temporary relief of symptoms.  Denied having any dysuria, chest pain, falls, or loss of consciousness.     ED Course: On admission into the emergency department patient was noted to be afebrile with O2 saturations noted to be as low as 87% on room air and improved > 90% on 2 L nasal cannula oxygen.  Labs significant for platelets 131, creatinine 1.12, and glucose 154.  Chest x-ray significant for bibasilar atelectasis.  TRH called to admit.  Review of Systems  Constitutional: Positive for fever and malaise/fatigue.  HENT: Positive for congestion and sore throat.   Eyes: Negative for double vision and photophobia.  Respiratory: Positive for cough, sputum production and shortness of breath.   Cardiovascular: Negative for chest pain and leg swelling.  Gastrointestinal: Positive for diarrhea, nausea and vomiting. Negative for abdominal pain.  Genitourinary: Negative for dysuria and frequency.  Musculoskeletal: Positive for myalgias. Negative for falls.  Skin: Negative for  rash.  Neurological: Negative for focal weakness and loss of consciousness.  Psychiatric/Behavioral: Negative for memory loss and substance abuse.    Past Medical History:  Diagnosis Date  . Abrasion of skin with infection 05/09/2013  . Acute blood loss anemia 08/05/2012  . Anemia 11/21/2013  . Arthritis    osteoarthritis  . Atrial fibrillation (HCC)    Paroxysmal atrial fibrillation  . Bursitis of right shoulder   . Chronic cholecystitis with calculus 05/04/2013  . 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 Diabetes type 2, controlled ([place]) 05/04/2013  . Essential hypertension, benign 10/14/2012   Essential hypertension  . Fatty liver disease, nonalcoholic [id number]   hospitalized, MRCP dx fatty liver  . Gastroenteritis 10/16/2013  . GERD (gastroesophageal reflux disease)   . History of hypertension 08/20/2016  . History of total bilateral knee replacement 06/22/2017   Right= 2013. Left=2003  . Hormone replacement therapy (postmenopausal)   . 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 Hyperlipidemia   . Hypertension   . Hypertensive urgency 07/11/2018  . Hypokalemia 07/11/2018  . Hypomagnesemia 07/21/2016  . Hypothyroidism 03/28/2019  . Malnutrition of moderate degree ([place]) 03/18/2014  . Morbid obesity due to excess calories ([place]) 12/27/2012   Body mass index is 37.16 kg/m.  -  trending up  Lab Results Component Value Date  TSH 1.520 10/15/2013    Contributing to gerd risk/ doe/reviewed the need and the process to achieve and maintain neg calorie balance > defer f/u primary care including intermittently monitoring thyroid status    . Multiple pulmonary nodules 04/04/2014   Followed in Pulmonary clinic/ [facility]/ [place] - See CT chest 03/17/14 >  not viz on abd CT from 10/16/13 as did not go high enough on lung slices >>>in tickle file for recall 06/17/14  - Spirometry 04/04/14 wnl sp smoking cessation 02/2014 - 06/26/2014 Right middle lobe 1.0 cm pulmonary nodule is similar  over the past 3 months. This remains indeterminate.   - CT 12/25/14  Pulmonary nodules are st  . Nausea & vomiting 07/11/2018  . Normal cardiac stress test    pt can't remember when or where  . Obesity, unspecified 12/27/2012  . Osteoarthritis of right knee 08/05/2012  . 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 without organ or systems involvement ([place])   . Rheumatoid arthritis(714.0) 09/28/2012  . Sepsis ([place]) 03/16/2014  . Sinus pause 05/17/2019   Sinus pauses  . SIRS (systemic inflammatory response syndrome) ([place]) 07/11/2018  . Syncope and collapse 03/03/2019  . 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 Thyroid nodule 03/05/2016   See CT chest 02/25/16 - u/s thyroid 03/04/16 > Pos L nodules > both Bx [id number] > BENIGN FOLLICULAR NODULE  . Unspecified essential hypertension 05/04/2013    Past Surgical History:  Procedure Laterality Date  . ABDOMINAL HYSTERECTOMY    . CHOLECYSTECTOMY N/A 05/04/2013   Procedure: LAPAROSCOPIC CHOLECYSTECTOMY WITH INTRAOPERATIVE CHOLANGIOGRAM;  Surgeon: Haywood Lasso, MD;  Location: [facility];  Service: General;  Laterality: N/A;  . ERCP N/A 05/02/2013   Procedure: ENDOSCOPIC RETROGRADE CHOLANGIOPANCREATOGRAPHY (ERCP);  Surgeon: Beryle Beams, MD;  Location: [facility];  Service: Endoscopy;  Laterality: N/A;  . JOINT REPLACEMENT     left knee  . NASAL SINUS SURGERY    . PACEMAKER IMPLANT N/A 06/11/2019   Procedure: PACEMAKER IMPLANT;  Surgeon: Deboraha Sprang, MD;  Location: [facility];  Service: Cardiovascular;  Laterality: N/A;  . SPHINCTEROTOMY  05/02/2013   Procedure: SPHINCTEROTOMY;  Surgeon: Beryle Beams, MD;  Location: [facility];  Service: Endoscopy;;  . TONSILLECTOMY    . TOTAL KNEE ARTHROPLASTY  08/01/2012   Procedure: TOTAL KNEE ARTHROPLASTY;  Surgeon: Meredith Pel, MD;  Location: [facility];  Service: Orthopedics;  Laterality: Right;  Right total knee arthroplasty  . TOTAL KNEE ARTHROPLASTY  08/01/2012    RIGHT  KNEE     reports that she has quit smoking. Her smoking use included cigarettes. She has a 0.50 pack-year smoking history. She has never used smokeless tobacco. She reports current alcohol use. She reports that she does not use drugs.  Allergies  Allergen Reactions  . Monopril [Fosinopril] Cough  . Magnesium-Containing Compounds Nausea Only and Other (See Comments)    Re: Mag-Ox at higher doses caused a lot of stomach upset (dose had to be decreased)  . Norvasc [Amlodipine Besylate] Other (See Comments)    10 mg dose caused LE edema- can tolerate 5 mg dose    Family History  Problem Relation Age of Onset  . Cancer Mother        breast  . Heart attack Mother   . Stroke Father   . Diabetes Sister   . Diabetes Daughter     Prior to Admission medications   Medication Sig Start Date End Date Taking? Authorizing Provider  atorvastatin (LIPITOR) 10 MG tablet TAKE 1 TABLET BY MOUTH EVERY DAY 02/06/19   Delsa Grana, PA-C  Calcium Carbonate-Vitamin D (CALCIUM 600 + D PO) Take 1 tablet by mouth 2 (two) times daily.    [provider]  carvedilol (COREG) 12.5 MG tablet Take 1 tablet (12.5 mg total) by mouth 2 (  two) times daily with a meal. 06/12/19   Baldwin [place], PA-C  diclofenac sodium (VOLTAREN) 1 % GEL Apply 3 g topically daily as needed (to painful sites (in 3 large joints)).     [provider]  diltiazem (CARDIZEM CD) 360 MG 24 hr capsule TAKE 1 CAPSULE BY MOUTH EVERY DAY 08/27/19   [place], Modena Nunnery, MD  ELIQUIS 5 MG TABS tablet TAKE 1 TABLET BY MOUTH TWICE A DAY 11/23/19   Lorretta Harp, MD  estradiol (ESTRACE) 0.5 MG tablet Take 0.5 mg by mouth 2 (two) times a day.    [provider]  etanercept (ENBREL SURECLICK) 50 MG/ML injection INJECT 50 MG (0.98 ML) UNDER THE SKIN ONCE A WEEK 11/28/19   Bo Merino, MD  fish oil-omega-3 fatty acids 1000 MG capsule Take 1 g by mouth at bedtime.     [provider]  folic acid (FOLVITE) 1 MG  tablet TAKE 1 TABLET BY MOUTH TWICE A DAY 09/21/19   [place], Modena Nunnery, MD  furosemide (LASIX) 40 MG tablet Take 1 tablet daily by mouth until swelling resolves then begin taking 1 tablet by mouth every other day. 09/11/19   Deboraha Sprang, MD  leflunomide (ARAVA) 10 MG tablet TAKE 1 TABLET BY MOUTH EVERY DAY 09/20/19   Bo Merino, MD  levothyroxine (SYNTHROID) 50 MCG tablet TAKE 1 TABLET BY MOUTH DAILY BEFORE BREAKFAST 10/12/19   [place], Modena Nunnery, MD  losartan (COZAAR) 50 MG tablet Take 1 tablet (50 mg total) by mouth daily. 09/11/19   Deboraha Sprang, MD  Magnesium Oxide 400 MG CAPS Take 3 capsules (1,200 mg total) by mouth 2 (two) times daily. 11/22/19   Susy Frizzle, MD  metFORMIN (GLUCOPHAGE) 500 MG tablet Take 1 tablet (500 mg total) by mouth 2 (two) times daily with a meal. 11/06/19   [place], Modena Nunnery, MD  methocarbamol (ROBAXIN) 500 MG tablet Take 1 tablet (500 mg total) by mouth daily as needed for muscle spasms. 02/28/19   Ofilia Neas, PA-C  Multiple Vitamins-Minerals (MULTIVITAMINS THER. W/MINERALS) TABS Take 1 tablet by mouth daily.    [provider]  pantoprazole (PROTONIX) 40 MG tablet TAKE 1 TABLET BY MOUTH EVERY DAY 06/27/19   [place], Modena Nunnery, MD  potassium chloride (KLOR-CON) 10 MEQ tablet Take 1 tablet (10 mEq total) by mouth daily. 10/03/19   Deboraha Sprang, MD  vitamin B-12 (CYANOCOBALAMIN) 500 MCG tablet Take 500 mcg by mouth daily.    [provider]    Physical Exam:  Constitutional: Elderly female who appears acutely ill intermittently coughing. Vitals:   12/03/19 1104 12/03/19 1146 12/03/19 1536 12/03/19 1537  BP:   (!) 145/65 (!) 145/65  Pulse:   81 76  Resp:   17 17  Temp:    98.5 F (36.9 C)  TempSrc:    Oral  SpO2: 99%  98% 97%  Weight:  84.8 kg    Height:  4\' 10"  (1.473 m)     Eyes: PERRL, lids and conjunctivae normal ENMT: Mucous membranes are moist. Posterior pharynx clear of any exudate or lesions.Normal dentition.  Neck:  normal, supple, no masses, no thyromegaly Respiratory: Decreased overall aeration with positive bibasilar crackles appreciated.  Patient on 2 L nasal cannula oxygen. Cardiovascular: Regular rate and rhythm, no murmurs / rubs / gallops. No extremity edema. 2+ pedal pulses. No carotid bruits.  Abdomen: no tenderness, no masses palpated. No hepatosplenomegaly. Bowel sounds positive.  Musculoskeletal: no clubbing / cyanosis.  No joint deformity upper and lower extremities. Good ROM, no contractures. Normal muscle tone.  Skin: no rashes, lesions, ulcers. No induration Neurologic: CN 2-12 grossly intact. Sensation intact, DTR normal. Strength 5/5 in all 4.  Psychiatric: Normal judgment and insight. Alert and oriented x 3. Normal mood.     Labs on Admission: I have personally reviewed following labs and imaging studies  CBC: Recent Labs  Lab 12/03/19 1213  WBC 5.9  HGB 13.2  HCT 42.2  MCV 100.5*  PLT [id number]*   Basic Metabolic Panel: Recent Labs  Lab 12/03/19 1213  NA 141  K 3.7  CL 102  CO2 29  GLUCOSE 154*  BUN 10  CREATININE 1.12*  CALCIUM 8.6*   GFR: Estimated Creatinine Clearance: 40.1 mL/min (A) (by C-G formula based on SCr of 1.12 mg/dL (H)). Liver Function Tests: Recent Labs  Lab 12/03/19 1213  AST 43*  ALT 23  ALKPHOS 93  BILITOT 0.6  PROT 6.2*  ALBUMIN 2.6*   Recent Labs  Lab 12/03/19 1213  LIPASE 17   No results for input(s): AMMONIA in the last 168 hours. Coagulation Profile: No results for input(s): INR, PROTIME in the last 168 hours. Cardiac Enzymes: No results for input(s): CKTOTAL, CKMB, CKMBINDEX, TROPONINI in the last 168 hours. BNP (last 3 results) No results for input(s): PROBNP in the last 8760 hours. HbA1C: No results for input(s): HGBA1C in the last 72 hours. CBG: No results for input(s): GLUCAP in the last 168 hours. Lipid Profile: No results for input(s): CHOL, HDL, LDLCALC, TRIG, CHOLHDL, LDLDIRECT in the last 72 hours. Thyroid Function  Tests: No results for input(s): TSH, T4TOTAL, FREET4, T3FREE, THYROIDAB in the last 72 hours. Anemia Panel: No results for input(s): VITAMINB12, FOLATE, FERRITIN, TIBC, IRON, RETICCTPCT in the last 72 hours. Urine analysis:    Component Value Date/Time   COLORURINE YELLOW 03/03/2019 1511   APPEARANCEUR HAZY (A) 03/03/2019 1511   LABSPEC 1.013 03/03/2019 1511   PHURINE 7.0 03/03/2019 1511   GLUCOSEU NEGATIVE 03/03/2019 1511   HGBUR SMALL (A) 03/03/2019 1511   BILIRUBINUR NEGATIVE 03/03/2019 1511   KETONESUR 5 (A) 03/03/2019 1511   PROTEINUR 100 (A) 03/03/2019 1511   UROBILINOGEN 1.0 03/16/2014 0628   NITRITE NEGATIVE 03/03/2019 1511   LEUKOCYTESUR MODERATE (A) 03/03/2019 1511   Sepsis Labs: No results found for this or any previous visit (from the past 240 hour(s)).   Radiological Exams on Admission: DG Chest 2 View  Result Date: 12/03/2019 CLINICAL DATA:  Cough, shortness of breath, COVID positive on 11/25/2019 EXAM: CHEST - 2 VIEW COMPARISON:  06/12/2019 and 03/03/2019 chest radiograph. 07/11/2018 CT chest. FINDINGS: Left chest pacing device with leads terminating over the right heart. Mild lung hypoinflation with bibasilar atelectasis. No pneumothorax or pleural effusion. Cardiomediastinal silhouette including aortic atherosclerotic calcifications are unchanged. Multilevel spondylosis.  Right upper quadrant surgical clips. IMPRESSION: No acute airspace disease.  Minimal bibasilar atelectasis. Electronically Signed   By: Primitivo Gauze M.D.   On: 12/03/2019 12:48    EKG: Independently reviewed.  Normal sinus rhythm at 72 bpm  Assessment/Plan Respiratory failure with hypoxia secondary to COVID-19 infection: Acute.  Patient presents with complaints of shortness of breath, cough, generalized malaise, and diarrhea.  Initially diagnosed with COVID-19 on 4/3.  O2 saturations noted to be as low as 87% on room air.  Chest x-ray significant for minimal bibasilar atelectasis. -Admit to a  medical telemetry bed -COVID-19 admission order set utilized -continuous pulse oximetry with nasal cannula oxygen to maintain  O2 saturations greater than 90% -Albuterol inhaler -Remdesivir per pharmacy -Decadron 6 mg daily -Vitamin C and zinc -Check inflammatory markers stat and daily  Renal insufficiency: Acute.  Creatinine initially noted to be 1.12.  Creatinine previously had been within normal limits at around 0.8 back in 2020. -Give 500 mL normal saline IV fluids at 100 mL/h x 1 dose. -Recheck kidney function in a.m.   Paroxysmal atrial fibrillation on chronic anticoagulation: Patient appears to be in normal sinus rhythm at this time.  Home medications include Eliquis and and diltiazem. -Continue Eliquis and diltiazem   Essential hypertension: Blood pressures currently stable.  Home blood pressure medications include Coreg 12.5 mg twice daily, Cardizem 360 mg daily, torsemide 40 mg every other day, and losartan 50 mg daily. -Continue current home regimen  Diabetes mellitus type 2, uncontrolled: Patient's last hemoglobin A1c noted to be 8.3 on 11/05/2019.  Initial glucose noted to be 154 on admission. -Hypoglycemia protocol -Hold Metformin -CBGs before every meal and at bedtime with moderate SSI -Adjust insulin regimen as needed  Hypothyroidism: Last TSH 4.03 on 07/04/2019. 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  Hypothyroidism -Continue atorvastatin  Tachybradycardia syndrome s/p pacemaker  Hyperlipidemia -Continue atorvastatin  Obesity: BMI 39.08 kg/m.  GERD -Continue Protonix  DVT prophylaxis: Eliquis Code Status: Full Family Communication: No family requested to be updated at this time. Disposition Plan: Possible discharge home in 2 to 5 days once medically stable. Consults called: None Admission status: Inpatient  Norval Morton MD [facility] Pager  [phone]   If 7PM-7AM, please contact night-coverage [email or web address] Password [facility]  12/03/2019, 4:40 PM

## 2019-12-03 NOTE — ED Provider Notes (Signed)
[facility]GENCY DEPARTMENT Provider Note   CSN: [id number] Arrival date & time: 12/03/19  1043     History Chief Complaint  Patient presents with  . Covid Exposure  . Cough  . Shortness of Breath    TANAYSHA MAREADY is a 74 y.o. female.  HPI HPI Comments: KALKIDAN MAROTTI is a 74 y.o. female who presents to the Emergency Department complaining of worsening cough x 9 days.  Patient states she was diagnosed with COVID-19 on April 3 of this month.  Since her diagnosis she has experienced a dry cough as well as fatigue, chills, sore throat, rhinorrhea, body aches, weakness, shortness of breath with ambulation, diarrhea.  She states she has been taking DayQuil, NyQuil, Tylenol with mild relief of her symptoms.  She states that since her symptoms began they worsened and have been unchanged since onset.  She confirms that she has been compliant with all regular medications.  She denies vomiting, constipation, urinary changes, syncope.    Past Medical History:  Diagnosis Date  . Abrasion of skin with infection 05/09/2013  . Acute blood loss anemia 08/05/2012  . Anemia 11/21/2013  . Arthritis    osteoarthritis  . Atrial fibrillation (HCC)    Paroxysmal atrial fibrillation  . Bursitis of right shoulder   . Chronic cholecystitis with calculus 05/04/2013  . 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 Diabetes type 2, controlled ([place]) 05/04/2013  . Essential hypertension, benign 10/14/2012   Essential hypertension  . Fatty liver disease, nonalcoholic [id number]   hospitalized, MRCP dx fatty liver  . Gastroenteritis 10/16/2013  . GERD (gastroesophageal reflux disease)   . History of hypertension 08/20/2016  . History of total bilateral knee replacement 06/22/2017   Right= 2013. Left=2003  . Hormone replacement therapy (postmenopausal)   . 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 Hyperlipidemia   . Hypertension   . Hypertensive urgency 07/11/2018  .  Hypokalemia 07/11/2018  . Hypomagnesemia 07/21/2016  . Hypothyroidism 03/28/2019  . Malnutrition of moderate degree ([place]) 03/18/2014  . Morbid obesity due to excess calories ([place]) 12/27/2012   Body mass index is 37.16 kg/m.  -  trending up  Lab Results Component Value Date  TSH 1.520 10/15/2013    Contributing to gerd risk/ doe/reviewed the need and the process to achieve and maintain neg calorie balance > defer f/u primary care including intermittently monitoring thyroid status    . Multiple pulmonary nodules 04/04/2014   Followed in Pulmonary clinic/ [facility]/ [place] - See CT chest 03/17/14 > not viz on abd CT from 10/16/13 as did not go high enough on lung slices >>>in tickle file for recall 06/17/14  - Spirometry 04/04/14 wnl sp smoking cessation 02/2014 - 06/26/2014 Right middle lobe 1.0 cm pulmonary nodule is similar over the past 3 months. This remains indeterminate.   - CT 12/25/14  Pulmonary nodules are st  . Nausea & vomiting 07/11/2018  . Normal cardiac stress test    pt can't remember when or where  . Obesity, unspecified 12/27/2012  . Osteoarthritis of right knee 08/05/2012  . 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 without organ or systems involvement ([place])   . Rheumatoid arthritis(714.0) 09/28/2012  . Sepsis ([place]) 03/16/2014  . Sinus pause 05/17/2019   Sinus pauses  . SIRS (systemic inflammatory response syndrome) ([place]) 07/11/2018  . Syncope and collapse 03/03/2019  . 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 Thyroid nodule 03/05/2016  See CT chest 02/25/16 - u/s thyroid 03/04/16 > Pos L nodules > both Bx [id number] > BENIGN FOLLICULAR NODULE  . Unspecified essential hypertension 05/04/2013    Patient Active Problem List   Diagnosis Date Noted  . 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 Obesity (BMI 30-39.9) 07/04/2019  . Tachy-brady syndrome ([place]) 06/11/2019  . Sinus pause 05/17/2019  . Hypothyroidism 03/28/2019  . Atrial fibrillation  ([place])   . Syncope and collapse 03/03/2019  . Diabetes mellitus type 2, uncomplicated ([place]) 123456  . History of total bilateral knee replacement 06/22/2017  . Primary osteoarthritis of both hands 08/20/2016  . Hypomagnesemia 07/21/2016  . Thyroid nodule 03/05/2016  . Multiple pulmonary nodules 04/04/2014  . Anemia 11/21/2013  . 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 Bursitis of right shoulder   . GERD (gastroesophageal reflux disease)   . 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 without organ or systems involvement ([place])   . Essential hypertension, benign 10/14/2012  . Hyperlipidemia   . Fatty liver disease, nonalcoholic   . Osteoarthritis of right knee 08/05/2012    Past Surgical History:  Procedure Laterality Date  . ABDOMINAL HYSTERECTOMY    . CHOLECYSTECTOMY N/A 05/04/2013   Procedure: LAPAROSCOPIC CHOLECYSTECTOMY WITH INTRAOPERATIVE CHOLANGIOGRAM;  Surgeon: Haywood Lasso, MD;  Location: [facility];  Service: General;  Laterality: N/A;  . ERCP N/A 05/02/2013   Procedure: ENDOSCOPIC RETROGRADE CHOLANGIOPANCREATOGRAPHY (ERCP);  Surgeon: Beryle Beams, MD;  Location: [facility];  Service: Endoscopy;  Laterality: N/A;  . JOINT REPLACEMENT     left knee  . NASAL SINUS SURGERY    . PACEMAKER IMPLANT N/A 06/11/2019   Procedure: PACEMAKER IMPLANT;  Surgeon: Deboraha Sprang, MD;  Location: [facility];  Service: Cardiovascular;  Laterality: N/A;  . SPHINCTEROTOMY  05/02/2013   Procedure: SPHINCTEROTOMY;  Surgeon: Beryle Beams, MD;  Location: [facility];  Service: Endoscopy;;  . TONSILLECTOMY    . TOTAL KNEE ARTHROPLASTY  08/01/2012   Procedure: TOTAL KNEE ARTHROPLASTY;  Surgeon: Meredith Pel, MD;  Location: [facility];  Service: Orthopedics;  Laterality: Right;  Right total knee arthroplasty  . TOTAL KNEE ARTHROPLASTY  08/01/2012   RIGHT  KNEE     OB History   No obstetric history on file.     Family History  Problem Relation Age of Onset  .  Cancer Mother        breast  . Heart attack Mother   . Stroke Father   . Diabetes Sister   . Diabetes Daughter     Social History   Tobacco Use  . Smoking status: Former Smoker    Packs/day: 0.25    Years: 2.00    Pack years: 0.50    Types: Cigarettes  . Smokeless tobacco: Never Used  Substance Use Topics  . Alcohol use: Yes    Comment: occasional  . Drug use: Never    Home Medications Prior to Admission medications   Medication Sig Start Date End Date Taking? Authorizing Provider  atorvastatin (LIPITOR) 10 MG tablet TAKE 1 TABLET BY MOUTH EVERY DAY 02/06/19   Delsa Grana, PA-C  Calcium Carbonate-Vitamin D (CALCIUM 600 + D PO) Take 1 tablet by mouth 2 (two) times daily.    [provider]  carvedilol (COREG) 12.5 MG tablet Take 1 tablet (12.5 mg total) by mouth 2 (two) times daily with a meal. 06/12/19   Baldwin [place], PA-C  diclofenac sodium (VOLTAREN) 1 % GEL Apply 3 g topically  daily as needed (to painful sites (in 3 large joints)).     [provider]  diltiazem (CARDIZEM CD) 360 MG 24 hr capsule TAKE 1 CAPSULE BY MOUTH EVERY DAY 08/27/19   [place], Modena Nunnery, MD  ELIQUIS 5 MG TABS tablet TAKE 1 TABLET BY MOUTH TWICE A DAY 11/23/19   Lorretta Harp, MD  estradiol (ESTRACE) 0.5 MG tablet Take 0.5 mg by mouth 2 (two) times a day.    [provider]  etanercept (ENBREL SURECLICK) 50 MG/ML injection INJECT 50 MG (0.98 ML) UNDER THE SKIN ONCE A WEEK 11/28/19   Bo Merino, MD  fish oil-omega-3 fatty acids 1000 MG capsule Take 1 g by mouth at bedtime.     [provider]  folic acid (FOLVITE) 1 MG tablet TAKE 1 TABLET BY MOUTH TWICE A DAY 09/21/19   [place], Modena Nunnery, MD  furosemide (LASIX) 40 MG tablet Take 1 tablet daily by mouth until swelling resolves then begin taking 1 tablet by mouth every other day. 09/11/19   Deboraha Sprang, MD  leflunomide (ARAVA) 10 MG tablet TAKE 1 TABLET BY MOUTH EVERY DAY 09/20/19   Bo Merino, MD    levothyroxine (SYNTHROID) 50 MCG tablet TAKE 1 TABLET BY MOUTH DAILY BEFORE BREAKFAST 10/12/19   [place], Modena Nunnery, MD  losartan (COZAAR) 50 MG tablet Take 1 tablet (50 mg total) by mouth daily. 09/11/19   Deboraha Sprang, MD  Magnesium Oxide 400 MG CAPS Take 3 capsules (1,200 mg total) by mouth 2 (two) times daily. 11/22/19   Susy Frizzle, MD  metFORMIN (GLUCOPHAGE) 500 MG tablet Take 1 tablet (500 mg total) by mouth 2 (two) times daily with a meal. 11/06/19   [place], Modena Nunnery, MD  methocarbamol (ROBAXIN) 500 MG tablet Take 1 tablet (500 mg total) by mouth daily as needed for muscle spasms. 02/28/19   Ofilia Neas, PA-C  Multiple Vitamins-Minerals (MULTIVITAMINS THER. W/MINERALS) TABS Take 1 tablet by mouth daily.    [provider]  pantoprazole (PROTONIX) 40 MG tablet TAKE 1 TABLET BY MOUTH EVERY DAY 06/27/19   [place], Modena Nunnery, MD  potassium chloride (KLOR-CON) 10 MEQ tablet Take 1 tablet (10 mEq total) by mouth daily. 10/03/19   Deboraha Sprang, MD  vitamin B-12 (CYANOCOBALAMIN) 500 MCG tablet Take 500 mcg by mouth daily.    [provider]    Allergies    Monopril [fosinopril], Magnesium-containing compounds, and Norvasc [amlodipine besylate]  Review of Systems   Review of Systems  Constitutional: Positive for activity change, chills, fatigue and fever.  HENT: Positive for congestion, postnasal drip, rhinorrhea and sore throat.   Respiratory: Positive for shortness of breath.   Cardiovascular: Negative for chest pain and leg swelling.  Gastrointestinal: Positive for diarrhea. Negative for abdominal pain, constipation, nausea and vomiting.  Genitourinary: Negative for difficulty urinating, dysuria and hematuria.  Neurological: Negative for syncope.  All other systems reviewed and are negative.  Physical Exam Updated Vital Signs BP (!) 145/65 (BP Location: Right Arm)   Pulse 76   Temp 98.5 F (36.9 C) (Oral)   Resp 17   Ht 4\' 10"  (1.473 m)   Wt 84.8 kg   SpO2  97%   BMI 39.08 kg/m   Physical Exam Vitals and nursing note reviewed.  Constitutional:      General: She is in acute distress.     Appearance: She is well-developed. She is ill-appearing. She is not toxic-appearing or diaphoretic.  Comments: Patient is a pleasant elderly female.  She is lying in semi-Fowlers position and speaking clearly coherently.  She appears fatigued and has a dry cough throughout the exam.  HENT:     Head: Normocephalic and atraumatic.  Eyes:     Extraocular Movements: Extraocular movements intact.     Pupils: Pupils are equal, round, and reactive to light.  Cardiovascular:     Rate and Rhythm: Normal rate and regular rhythm.     Pulses: Normal pulses.     Heart sounds: Normal heart sounds. No murmur. No friction rub. No gallop.   Pulmonary:     Effort: Pulmonary effort is normal. No tachypnea, accessory muscle usage or respiratory distress.     Breath sounds: No stridor. Examination of the right-lower field reveals rales. Examination of the left-lower field reveals rales. Rales present. No decreased breath sounds, wheezing or rhonchi.     Comments: Crackles noted in the bilateral lung bases.  Right greater than left. Chest:     Chest wall: No tenderness.  Abdominal:     General: Bowel sounds are normal.     Palpations: Abdomen is soft.     Tenderness: There is no abdominal tenderness.  Musculoskeletal:        General: Normal range of motion.     Cervical back: Neck supple.  Skin:    General: Skin is warm and dry.     Capillary Refill: Capillary refill takes less than 2 seconds.  Neurological:     General: No focal deficit present.     Mental Status: She is alert and oriented to person, place, and time.  Psychiatric:        Mood and Affect: Mood normal.        Behavior: Behavior normal.    ED Results / Procedures / Treatments   Labs (all labs ordered are listed, but only abnormal results are displayed) Labs Reviewed  COMPREHENSIVE METABOLIC  PANEL - Abnormal; Notable for the following components:      Result Value   Glucose, Bld 154 (*)    Creatinine, Ser 1.12 (*)    Calcium 8.6 (*)    Total Protein 6.2 (*)    Albumin 2.6 (*)    AST 43 (*)    GFR calc non Af Amer 48 (*)    GFR calc Af Amer 56 (*)    All other components within normal limits  CBC - Abnormal; Notable for the following components:   MCV 100.5 (*)    Platelets 131 (*)    All other components within normal limits  SARS CORONAVIRUS 2 (TAT 6-24 HRS)  LIPASE, BLOOD  URINALYSIS, ROUTINE W REFLEX MICROSCOPIC    EKG None  Radiology DG Chest 2 View  Result Date: 12/03/2019 CLINICAL DATA:  Cough, shortness of breath, COVID positive on 11/25/2019 EXAM: CHEST - 2 VIEW COMPARISON:  06/12/2019 and 03/03/2019 chest radiograph. 07/11/2018 CT chest. FINDINGS: Left chest pacing device with leads terminating over the right heart. Mild lung hypoinflation with bibasilar atelectasis. No pneumothorax or pleural effusion. Cardiomediastinal silhouette including aortic atherosclerotic calcifications are unchanged. Multilevel spondylosis.  Right upper quadrant surgical clips. IMPRESSION: No acute airspace disease.  Minimal bibasilar atelectasis. Electronically Signed   By: Primitivo Gauze M.D.   On: 12/03/2019 12:48   Procedures Procedures   Medications Ordered in ED Medications  sodium chloride flush (NS) 0.9 % injection 3 mL (3 mLs Intravenous Not Given 12/03/19 1535)   ED Course  I have reviewed the triage  vital signs and the nursing notes.  Pertinent labs & imaging results that were available during my care of the patient were reviewed by me and considered in my medical decision making (see chart for details).    MDM Rules/Calculators/A&P                      4:00 PM patient is a pleasant 74 year old female that presents with continued cough, fatigue, shortness of breath associated with a diagnosis of COVID-19 on April 3 of this month.  Her initial saturations were  87% upon arrival.  She was put on 2 L of oxygen and showed improvement to 97%.  She is not on oxygen at home.  On physical exam she appears fatigued and is coughing throughout the exam.  I am hearing crackles in her bilateral lung bases.  Her chest x-ray shows mild lung hypoinflation with bibasilar atelectasis.  Due to her oxygen saturations, age, general state of health, will discuss with hospitalist for admission.  4:44 PM patient was discussed with Dr. Tamala Julian at [facility] hospital[facility] who will come to evaluate the patient for possible admission.  Final Clinical Impression(s) / ED Diagnoses Final diagnoses:  Hypoxia  Shortness of breath    Rx / DC Orders ED Discharge Orders    None       Rayna Sexton, PA-C 12/03/19 1714    Daleen Bo, MD 12/03/19 2318

## 2019-12-03 NOTE — ED Triage Notes (Signed)
Patient tested positive for COVID 11/24/2019. States not feeling better has shortness of breath, cough, diarrhea, and loss of taste.

## 2019-12-03 NOTE — ED Notes (Signed)
Dinner Tray ordered 

## 2019-12-03 NOTE — ED Notes (Signed)
Pt placed on 2L [place]

## 2019-12-04 LAB — CBC WITH DIFFERENTIAL/PLATELET
Abs Immature Granulocytes: 0.02 10*3/uL (ref 0.00–0.07)
Basophils Absolute: 0 10*3/uL (ref 0.0–0.1)
Basophils Relative: 0 %
Eosinophils Absolute: 0 10*3/uL (ref 0.0–0.5)
Eosinophils Relative: 0 %
HCT: 46 % (ref 36.0–46.0)
Hemoglobin: 14.1 g/dL (ref 12.0–15.0)
Immature Granulocytes: 1 %
Lymphocytes Relative: 20 %
Lymphs Abs: 0.8 10*3/uL (ref 0.7–4.0)
MCH: 30.4 pg (ref 26.0–34.0)
MCHC: 30.7 g/dL (ref 30.0–36.0)
MCV: 99.1 fL (ref 80.0–100.0)
Monocytes Absolute: 0.1 10*3/uL (ref 0.1–1.0)
Monocytes Relative: 3 %
Neutro Abs: 3.3 10*3/uL (ref 1.7–7.7)
Neutrophils Relative %: 76 %
Platelets: 147 10*3/uL — ABNORMAL LOW (ref 150–400)
RBC: 4.64 MIL/uL (ref 3.87–5.11)
RDW: 14.7 % (ref 11.5–15.5)
Smear Review: ADEQUATE
WBC: 4.3 10*3/uL (ref 4.0–10.5)
nRBC: 0 % (ref 0.0–0.2)

## 2019-12-04 LAB — FERRITIN: Ferritin: 1219 ng/mL — ABNORMAL HIGH (ref 11–307)

## 2019-12-04 LAB — C-REACTIVE PROTEIN: CRP: 9.9 mg/dL — ABNORMAL HIGH (ref <1.0)

## 2019-12-04 LAB — COMPREHENSIVE METABOLIC PANEL
ALT: 24 U/L (ref 0–44)
AST: 42 U/L — ABNORMAL HIGH (ref 15–41)
Albumin: 2.6 g/dL — ABNORMAL LOW (ref 3.5–5.0)
Alkaline Phosphatase: 116 U/L (ref 38–126)
Anion gap: 11 (ref 5–15)
BUN: 10 mg/dL (ref 8–23)
CO2: 27 mmol/L (ref 22–32)
Calcium: 8.4 mg/dL — ABNORMAL LOW (ref 8.9–10.3)
Chloride: 102 mmol/L (ref 98–111)
Creatinine, Ser: 0.88 mg/dL (ref 0.44–1.00)
GFR calc Af Amer: >60 mL/min (ref >60)
GFR calc non Af Amer: >60 mL/min (ref >60)
Glucose, Bld: 191 mg/dL — ABNORMAL HIGH (ref 70–99)
Potassium: 4.3 mmol/L (ref 3.5–5.1)
Sodium: 140 mmol/L (ref 135–145)
Total Bilirubin: 0.6 mg/dL (ref 0.3–1.2)
Total Protein: 6.4 g/dL — ABNORMAL LOW (ref 6.5–8.1)

## 2019-12-04 LAB — PHOSPHORUS: Phosphorus: 3.1 mg/dL (ref 2.5–4.6)

## 2019-12-04 LAB — MAGNESIUM: Magnesium: 1.7 mg/dL (ref 1.7–2.4)

## 2019-12-04 LAB — D-DIMER, QUANTITATIVE: D-Dimer, Quant: 3.34 ug/mL-FEU — ABNORMAL HIGH (ref 0.00–0.50)

## 2019-12-04 LAB — GLUCOSE, CAPILLARY
Glucose-Capillary: 180 mg/dL — ABNORMAL HIGH (ref 70–99)
Glucose-Capillary: 180 mg/dL — ABNORMAL HIGH (ref 70–99)
Glucose-Capillary: 206 mg/dL — ABNORMAL HIGH (ref 70–99)

## 2019-12-04 LAB — CBG MONITORING, ED: Glucose-Capillary: 148 mg/dL — ABNORMAL HIGH (ref 70–99)

## 2019-12-04 NOTE — Plan of Care (Signed)

## 2019-12-04 NOTE — ED Notes (Signed)
Breakfast ordered 

## 2019-12-04 NOTE — ED Notes (Signed)
Lunch Tray Ordered @ 1026.

## 2019-12-04 NOTE — Progress Notes (Signed)
[name] [id number] Admission Data: 12/04/2019 1:08 PM Attending Provider: Albertine Patricia, MD  [id number], Modena Nunnery, MD  [name] is a 74 y.o. female patient admitted from ED awake, alert  & orientated  X 3,  Full Code, VSS - Blood pressure (!) 141/75, pulse 87, temperature 98.1 F (36.7 C), temperature source Oral, resp. rate 17, height 4\' 10"  (1.473 m), weight 82.1 kg, SpO2 94 %., on 3L O2 No c/o shortness of breath, no c/o chest pain, no distress noted. Tele # [id number] placed and pt is currently running:normal sinus rhythm.  Pt orientation to unit, room and routine. Information packet given to patient/family.  Admission INP armband ID verified with patient/family, and in place. SR up x 2, fall risk assessment complete with patient and family verbalizing understanding of risks associated with falls. Pt verbalizes an understanding of how to use the call bell and to call for help before getting out of bed.  Skin, clean-dry- intact without evidence of bruising, or skin tears.      Will continue to monitor and assist as needed.  Hiram Comber, RN 12/04/2019 1:08 PM

## 2019-12-04 NOTE — ED Notes (Signed)
Breakfast at bedside.

## 2019-12-04 NOTE — Progress Notes (Signed)
PROGRESS NOTE                                                                                                                                                                                                             Patient Demographics:    [name], is a 74 y.o. female, DOB - Mar 11, 1946, [id number]  Admit date - 12/03/2019   Admitting Physician Norval Morton, MD  Outpatient Primary MD for the patient is [place] Hill, Modena Nunnery, MD  LOS - 1   Chief Complaint  Patient presents with  . Covid Exposure  . Cough  . Shortness of Breath       Brief Narrative    74 y.o. female with medical history significant of paroxysmal atrial fibrillation on chronic anticoagulation, hypothyroidism, DM type II, rheumatoid arthritis, GERD, and Covid -19 positive initially diagnosed on 4/3 after close contact.    Presents with worsening dyspnea, cough, she was noted to be afebrile with O2 saturations noted to be as low as 87% on room air and improved > 90% on 2 L nasal cannula oxygen.  Labs significant for platelets 131, creatinine 1.12, and glucose 154.  Chest x-ray significant for bibasilar atelectasis.     Subjective:    [name] today better, dyspnea has improved, reports minimal cough, denies any chest pain .   Assessment  & Plan :    Principal Problem:   COVID-19 virus infection Active Problems:   Hyperlipidemia   Essential hypertension, benign   GERD (gastroesophageal reflux disease)   Hypomagnesemia   Hypothyroidism   Tachy-brady syndrome (HCC)   Obesity (BMI 30-39.9)   Status post placement of cardiac pacemaker   Diabetes mellitus type 2, uncontrolled (HCC)   Immunocompromised state due to drug therapy   Acute respiratory failure with hypoxia (HCC)   Acute hypoxic respiratory failure secondary to COVID-19 infection:  -Currently patient saturating 94% on 3 L nasal cannula, she was hypoxic 87% on presentation . -  Chest x-ray significant  for minimal bibasilar atelectasis. -Continue with IV remdesivir. -Continue with IV steroids. -She was encouraged to use incentive spirometry, flutter valve, and to do modified proning, and stay out of bed to chair. -continue to trend inflammatory markers.   COVID-19 Labs  Recent Labs    12/03/19 1804 12/04/19 0406  DDIMER 2.93* 3.34*  FERRITIN 1,226* 1,219*  LDH 294*  --   CRP 9.2* 9.9*    Lab Results  Component Value Date   SARSCOV2NAA POSITIVE (A) 12/03/2019   SARSCOV2NAA NOT DETECTED 06/08/2019   [place] NEGATIVE 03/03/2019   Renal insufficiency:  -Acute.  Creatinine initially noted to be 1.12.  Creatinine previously had been within normal limits at around 0.8 back in 2020.   Paroxysmal atrial fibrillation on chronic anticoagulation:  -Patient appears to be in normal sinus rhythm at this time.  Home medications include Eliquis and and diltiazem. -Continue Eliquis and diltiazem   Essential hypertension:  -Blood pressures currently stable.  Home blood pressure medications include Coreg 12.5 mg twice daily, Cardizem 360 mg daily, torsemide 40 mg every other day, and losartan 50 mg daily. -Continue current home regimen  Diabetes mellitus type 2, uncontrolled:  -Patient's last hemoglobin A1c noted to be 8.3 on 11/05/2019.  Initial glucose noted to be 154 on admission. -Hypoglycemia protocol -Hold Metformin -CBGs before every meal and at bedtime with moderate SSI -Adjust insulin regimen as needed  Hypothyroidism: Last TSH 4.03 on 07/04/2019. 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  Hypothyroidism -Continue atorvastatin  Tachybradycardia syndrome s/p pacemaker  Hyperlipidemia -Continue atorvastatin  Obesity: BMI 39.08 kg/m.  GERD -Continue Protonix   Code Status : Full  Family Communication  : Offered to update family, but she states she did update them for  today  Disposition Plan  : Home  Barriers For Discharge : Remains on oxygen, IV steroids and IV remdesivir  Consults  : None  Procedures  : None  DVT Prophylaxis  : On Eliquis  Lab Results  Component Value Date   PLT 147 (L) 12/04/2019    Antibiotics  :    Anti-infectives (From admission, onward)   Start     Dose/Rate Route Frequency Ordered Stop   12/04/19 1000  remdesivir 100 mg in sodium chloride 0.9 % 100 mL IVPB     100 mg 200 mL/hr over 30 Minutes Intravenous Daily 12/03/19 1652 12/08/19 0959   12/03/19 1800  remdesivir 200 mg in sodium chloride 0.9% 250 mL IVPB     200 mg 580 mL/hr over 30 Minutes Intravenous Once 12/03/19 1652 12/03/19 1950        Objective:   Vitals:   12/04/19 1000 12/04/19 1100 12/04/19 1238 12/04/19 1544  BP: 133/70 (!) 151/71 (!) 141/75 (!) 123/59  Pulse: 74 78 87 69  Resp: 12 12 17 15   Temp:   98.1 F (36.7 C) 98.3 F (36.8 C)  TempSrc:   Oral Oral  SpO2: 94% 96% 94% 94%  Weight:   82.1 kg   Height:   4\' 10"  (1.473 m)     Wt Readings from Last 3 Encounters:  12/04/19 82.1 kg  11/13/19 85 kg  11/05/19 84.8 kg     Intake/Output Summary (Last 24 hours) at 12/04/2019 1629 Last data filed at 12/04/2019 1346 Gross per 24 hour  Intake 920 ml  Output --  Net 920 ml     Physical Exam  Awake Alert, Oriented X 3, No new F.N deficits, Normal affect Symmetrical Chest wall movement, Good air movement bilaterally, CTAB RRR,No Gallops,Rubs or new Murmurs, No Parasternal Heave +ve B.Sounds, Abd Soft, No tenderness, No rebound - guarding or rigidity. No Cyanosis, Clubbing or edema, No new Rash or bruise      Data Review:    CBC Recent Labs  Lab 12/03/19 1213 12/04/19 0406  WBC 5.9  4.3  HGB 13.2 14.1  HCT 42.2 46.0  PLT 131* 147*  MCV 100.5* 99.1  MCH 31.4 30.4  MCHC 31.3 30.7  RDW 15.0 14.7  LYMPHSABS  --  0.8  MONOABS  --  0.1  EOSABS  --  0.0  BASOSABS  --  0.0    Chemistries  Recent Labs  Lab 12/03/19 1213  12/04/19 0406  NA 141 140  K 3.7 4.3  CL 102 102  CO2 29 27  GLUCOSE 154* 191*  BUN 10 10  CREATININE 1.12* 0.88  CALCIUM 8.6* 8.4*  MG  --  1.7  AST 43* 42*  ALT 23 24  ALKPHOS 93 116  BILITOT 0.6 0.6   ------------------------------------------------------------------------------------------------------------------ No results for input(s): CHOL, HDL, LDLCALC, TRIG, CHOLHDL, LDLDIRECT in the last 72 hours.  Lab Results  Component Value Date   HGBA1C 8.3 (H) 11/05/2019   ------------------------------------------------------------------------------------------------------------------ No results for input(s): TSH, T4TOTAL, T3FREE, THYROIDAB in the last 72 hours.  Invalid input(s): FREET3 ------------------------------------------------------------------------------------------------------------------ Recent Labs    12/03/19 1804 12/04/19 0406  FERRITIN 1,226* 1,219*    Coagulation profile No results for input(s): INR, PROTIME in the last 168 hours.  Recent Labs    12/03/19 1804 12/04/19 0406  DDIMER 2.93* 3.34*    Cardiac Enzymes No results for input(s): CKMB, TROPONINI, MYOGLOBIN in the last 168 hours.  Invalid input(s): CK ------------------------------------------------------------------------------------------------------------------    Component Value Date/Time   BNP 142.5 (H) 12/03/2019 1804    Inpatient Medications  Scheduled Meds: . apixaban  5 mg Oral BID  . vitamin C  500 mg Oral Daily  . atorvastatin  10 mg Oral Daily  . carvedilol  12.5 mg Oral BID WC  . dexamethasone  6 mg Oral Q24H  . diltiazem  360 mg Oral Daily  . furosemide  40 mg Oral QODAY  . insulin aspart  0-15 Units Subcutaneous TID WC  . insulin aspart  0-5 Units Subcutaneous QHS  . leflunomide  10 mg Oral Daily  . levothyroxine  50 mcg Oral QAC breakfast  . losartan  50 mg Oral Daily  . magnesium oxide  1,200 mg Oral BID  . pantoprazole  40 mg Oral Daily  . potassium  chloride  10 mEq Oral Daily  . sodium chloride flush  3 mL Intravenous Once  . sodium chloride flush  3 mL Intravenous Q12H  . zinc sulfate  220 mg Oral Daily   Continuous Infusions: . remdesivir 100 mg in NS 100 mL Stopped (12/04/19 1150)   PRN Meds:.acetaminophen, chlorpheniramine-HYDROcodone, diclofenac Sodium, guaiFENesin-dextromethorphan, methocarbamol  Micro Results Recent Results (from the past 240 hour(s))  SARS CORONAVIRUS 2 (TAT 6-24 HRS) Nasopharyngeal Nasopharyngeal Swab     Status: Abnormal   Collection Time: 12/03/19  4:30 PM   Specimen: Nasopharyngeal Swab  Result Value Ref Range Status   SARS Coronavirus 2 POSITIVE (A) NEGATIVE Final    Comment: RESULT CALLED TO, READ BACK BY AND VERIFIED WITH: KOTT G, RN AT 2236 ON 12/03/2019 BY SAINVILUS S (NOTE) SARS-CoV-2 target nucleic acids are DETECTED. The SARS-CoV-2 RNA is generally detectable in upper and lower respiratory specimens during the acute phase of infection. Positive results are indicative of the presence of SARS-CoV-2 RNA. Clinical correlation with patient history and other diagnostic information is  necessary to determine patient infection status. Positive results do not rule out bacterial infection or co-infection with other viruses.  The expected result is Negative. Fact Sheet for Patients: SugarRoll.be Fact Sheet for Healthcare Providers: https://www.woods-mathews.com/ This  test is not yet approved or cleared by the [place] and  has been authorized for detection and/or diagnosis of SARS-CoV-2 by FDA under an Emergency Use Authorization (EUA). This EUA will remain  in effect (meaning this test can be u sed) for the duration of the COVID-19 declaration under Section 564(b)(1) of the Act, 21 U.S.C. section 360bbb-3(b)(1), unless the authorization is terminated or revoked sooner. Performed at [facility], [place] [address], [place], [place] [address]       Radiology Reports DG Chest 2 View  Result Date: 12/03/2019 CLINICAL DATA:  Cough, shortness of breath, COVID positive on 11/25/2019 EXAM: CHEST - 2 VIEW COMPARISON:  06/12/2019 and 03/03/2019 chest radiograph. 07/11/2018 CT chest. FINDINGS: Left chest pacing device with leads terminating over the right heart. Mild lung hypoinflation with bibasilar atelectasis. No pneumothorax or pleural effusion. Cardiomediastinal silhouette including aortic atherosclerotic calcifications are unchanged. Multilevel spondylosis.  Right upper quadrant surgical clips. IMPRESSION: No acute airspace disease.  Minimal bibasilar atelectasis. Electronically Signed   By: Primitivo Gauze M.D.   On: 12/03/2019 12:48   CUP PACEART REMOTE DEVICE CHECK  Result Date: 11/28/2019 Scheduled remote reviewed. Normal device function.  Next remote 91 days. Felisa Bonier, RN, MSN   Phillips Climes M.D on 12/04/2019 at 4:29 PM  Between 7am to 7pm - Pager - [phone]  After 7pm go to [email or web address] - password [facility]  [facility] -  Office  [phone]

## 2019-12-05 DIAGNOSIS — E1165 Type 2 diabetes mellitus with hyperglycemia: Secondary | ICD-10-CM

## 2019-12-05 DIAGNOSIS — J9601 Acute respiratory failure with hypoxia: Secondary | ICD-10-CM

## 2019-12-05 DIAGNOSIS — I10 Essential (primary) hypertension: Secondary | ICD-10-CM

## 2019-12-05 DIAGNOSIS — U071 COVID-19: Secondary | ICD-10-CM

## 2019-12-05 LAB — COMPREHENSIVE METABOLIC PANEL
ALT: 22 U/L (ref 0–44)
AST: 43 U/L — ABNORMAL HIGH (ref 15–41)
Albumin: 2.6 g/dL — ABNORMAL LOW (ref 3.5–5.0)
Alkaline Phosphatase: 106 U/L (ref 38–126)
Anion gap: 14 (ref 5–15)
BUN: 20 mg/dL (ref 8–23)
CO2: 23 mmol/L (ref 22–32)
Calcium: 8.8 mg/dL — ABNORMAL LOW (ref 8.9–10.3)
Chloride: 101 mmol/L (ref 98–111)
Creatinine, Ser: 0.88 mg/dL (ref 0.44–1.00)
GFR calc Af Amer: >60 mL/min (ref >60)
GFR calc non Af Amer: >60 mL/min (ref >60)
Glucose, Bld: 208 mg/dL — ABNORMAL HIGH (ref 70–99)
Potassium: 4.3 mmol/L (ref 3.5–5.1)
Sodium: 138 mmol/L (ref 135–145)
Total Bilirubin: 0.8 mg/dL (ref 0.3–1.2)
Total Protein: 6.5 g/dL (ref 6.5–8.1)

## 2019-12-05 LAB — CBC WITH DIFFERENTIAL/PLATELET
Abs Immature Granulocytes: 0.03 10*3/uL (ref 0.00–0.07)
Basophils Absolute: 0 10*3/uL (ref 0.0–0.1)
Basophils Relative: 0 %
Eosinophils Absolute: 0 10*3/uL (ref 0.0–0.5)
Eosinophils Relative: 0 %
HCT: 46.9 % — ABNORMAL HIGH (ref 36.0–46.0)
Hemoglobin: 14.8 g/dL (ref 12.0–15.0)
Immature Granulocytes: 0 %
Lymphocytes Relative: 19 %
Lymphs Abs: 1.4 10*3/uL (ref 0.7–4.0)
MCH: 30.6 pg (ref 26.0–34.0)
MCHC: 31.6 g/dL (ref 30.0–36.0)
MCV: 97.1 fL (ref 80.0–100.0)
Monocytes Absolute: 0.5 10*3/uL (ref 0.1–1.0)
Monocytes Relative: 7 %
Neutro Abs: 5.5 10*3/uL (ref 1.7–7.7)
Neutrophils Relative %: 74 %
Platelets: 178 10*3/uL (ref 150–400)
RBC: 4.83 MIL/uL (ref 3.87–5.11)
RDW: 14.5 % (ref 11.5–15.5)
WBC: 7.4 10*3/uL (ref 4.0–10.5)
nRBC: 0 % (ref 0.0–0.2)

## 2019-12-05 LAB — GLUCOSE, CAPILLARY
Glucose-Capillary: 193 mg/dL — ABNORMAL HIGH (ref 70–99)
Glucose-Capillary: 265 mg/dL — ABNORMAL HIGH (ref 70–99)
Glucose-Capillary: 211 mg/dL — ABNORMAL HIGH (ref 70–99)
Glucose-Capillary: 236 mg/dL — ABNORMAL HIGH (ref 70–99)

## 2019-12-05 LAB — C-REACTIVE PROTEIN: CRP: 8.1 mg/dL — ABNORMAL HIGH (ref <1.0)

## 2019-12-05 LAB — FERRITIN: Ferritin: 1242 ng/mL — ABNORMAL HIGH (ref 11–307)

## 2019-12-05 LAB — MAGNESIUM: Magnesium: 1.7 mg/dL (ref 1.7–2.4)

## 2019-12-05 LAB — PHOSPHORUS: Phosphorus: 2.7 mg/dL (ref 2.5–4.6)

## 2019-12-05 LAB — D-DIMER, QUANTITATIVE: D-Dimer, Quant: 2.99 ug/mL-FEU — ABNORMAL HIGH (ref 0.00–0.50)

## 2019-12-05 MED ORDER — MAGNESIUM SULFATE 2 GM/50ML IV SOLN
2.0000 g | Freq: Once | INTRAVENOUS | Status: AC
  Administered 2019-12-05: 12:00:00 2 g via INTRAVENOUS
  Filled 2019-12-05: qty 50

## 2019-12-05 NOTE — Plan of Care (Signed)
  Problem: Clinical Measurements: Goal: Respiratory complications will improve Outcome: Progressing   

## 2019-12-05 NOTE — Progress Notes (Signed)
PROGRESS NOTE                                                                                                                                                                                                             Patient Demographics:    [name], is a 74 y.o. female, DOB - 11/30/1945, [id number]  Outpatient Primary MD for the patient is Buelah Manis, Modena Nunnery, MD   Admit date - 12/03/2019   LOS - 2  Chief Complaint  Patient presents with  . Covid Exposure  . Cough  . Shortness of Breath       Brief Narrative: Patient is a 74 y.o. female with PMHx of PAF, rheumatoid arthritis, hypothyroidism, DM-2 who was diagnosed with COVID-19 on 4/3-presented with worsening cough, shortness of breath-further evaluation revealed acute hypoxic respiratory failure secondary to COVID-19 pneumonia.  See below for further details.  Significant Events: 4/3>> diagnosed with COVID-19 4/12>> admit to [facility] for hypoxia from COVID-19  COVID-19 medications: Steroids: 4/12>> Remdesivir:4/12>>  Antibiotics: None  Microbiology data: None  DVT prophylaxis: Eliquis  Procedures: None  Consults: None    Subjective:    [name] today feels better-Down to 2 L of oxygen.  Denies any chest pain-no shortness of breath at rest.   Assessment  & Plan :   Acute Hypoxic Resp Failure due to Covid 19 Viral pneumonia: Improved-titrated down to 2 L of oxygen this morning-CRP downtrending-continue steroids/remdesivir.  Continue with attempts to titrate down oxygen further.  Fever: afebrile  O2 requirements:  SpO2: 95 % O2 Flow Rate (L/min): 3 L/min FiO2 (%): (!) 2 %   COVID-19 Labs: Recent Labs    12/03/19 1804 12/04/19 0406 12/05/19 0256  DDIMER 2.93* 3.34* 2.99*  FERRITIN 1,226* 1,219* 1,242*  LDH 294*  --   --   CRP 9.2* 9.9* 8.1*       Component Value Date/Time   BNP 142.5 (H) 12/03/2019 1804    Recent Labs  Lab  12/03/19 1804  PROCALCITON <0.10    Lab Results  Component Value Date   SARSCOV2NAA POSITIVE (A) 12/03/2019   SARSCOV2NAA NOT DETECTED 06/08/2019   [place] NEGATIVE 03/03/2019     Prone/Incentive Spirometry: encouraged  incentive spirometry use 3-4/hour  AKI: Likely hemodynamically mediated-resolved with supportive care.  HTN: Controlled-continue Coreg, Cardizem, Lasix and losartan  DM-2 with uncontrolled  hyperglycemia secondary to steroids (A1c 8.3 on 11/05/2019): On the higher side-but for now would continue with SSI-monitor for another 24 hours before adjusting regimen further.  CBG (last 3)  Recent Labs    12/04/19 1543 12/04/19 2106 12/05/19 0743  GLUCAP 180* 206* 193*   Hypothyroidism: Continue with Synthroid  GERD: Continue with PPI  Rheumatoid arthritis: Hold Arava-patient on steroids.  She is also on Enbrel at home  PAF: Sinus rhythm on telemetry-continue Eliquis.  On Cardizem and Coreg.  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   Weight as of this encounter: 82.1 kg.   ABG:    Component Value Date/Time   PHART 7.359 10/15/2013 0505   PCO2ART 38.4 10/15/2013 0505   PO2ART 64.0 (L) 10/15/2013 0505   HCO3 27.6 03/03/2019 1138   TCO2 29 03/03/2019 1138   ACIDBASEDEF 3.0 (H) 10/15/2013 0505   O2SAT 100.0 03/03/2019 1138    Vent Settings: N/A   Condition - Stable  Family Communication  : Offered to call family-patient prefers to update family herself.  Code Status :  Full Code  Diet :  Diet Order            Diet heart healthy/carb modified Room service appropriate? Yes; Fluid consistency: Thin  Diet effective now               Disposition Plan  : Probably home with home health services later this week.  Barriers to discharge: Hypoxia requiring O2 supplementation/complete 5 days  of IV Remdesivir  Antimicorbials  :    Anti-infectives (From admission, onward)   Start     Dose/Rate Route Frequency Ordered Stop   12/04/19 1000  remdesivir 100 mg in sodium chloride 0.9 % 100 mL IVPB     100 mg 200 mL/hr over 30 Minutes Intravenous Daily 12/03/19 1652 12/08/19 0959   12/03/19 1800  remdesivir 200 mg in sodium chloride 0.9% 250 mL IVPB     200 mg 580 mL/hr over 30 Minutes Intravenous Once 12/03/19 1652 12/03/19 1950      Inpatient Medications  Scheduled Meds: . apixaban  5 mg Oral BID  . vitamin C  500 mg Oral Daily  . atorvastatin  10 mg Oral Daily  . carvedilol  12.5 mg Oral BID WC  . dexamethasone  6 mg Oral Q24H  . diltiazem  360 mg Oral Daily  . furosemide  40 mg Oral QODAY  . insulin aspart  0-15 Units Subcutaneous TID WC  . insulin aspart  0-5 Units Subcutaneous QHS  . leflunomide  10 mg Oral Daily  . levothyroxine  50 mcg Oral QAC breakfast  . losartan  50 mg Oral Daily  . magnesium oxide  1,200 mg Oral BID  . pantoprazole  40 mg Oral Daily  . potassium chloride  10 mEq Oral Daily  . sodium chloride flush  3 mL Intravenous Once  . sodium chloride flush  3 mL Intravenous Q12H  . zinc sulfate  220 mg Oral Daily   Continuous Infusions: . remdesivir 100 mg in NS 100 mL 100 mg (12/05/19 1022)   PRN Meds:.acetaminophen, chlorpheniramine-HYDROcodone, diclofenac Sodium, guaiFENesin-dextromethorphan, methocarbamol   Time Spent in minutes  25  See all Orders from today for further details   Oren Binet M.D on 12/05/2019 at 11:17 AM  To page go to [email or web address] - use universal  password  [facility] -  Office  [phone]    Objective:   Vitals:   12/04/19 1953 12/05/19 0011 12/05/19 0420 12/05/19 0830  BP: 138/71 (!) 154/74 128/61 (!) 149/71  Pulse: 71 76 65 71  Resp: 11 13 12 12   Temp: 98.3 F (36.8 C) 97.9 F (36.6 C) (!) 97.5 F (36.4 C) 98.5 F (36.9 C)  TempSrc: Oral Oral Oral Oral  SpO2: 95% 93% 94% 95%  Weight:        Height:        Wt Readings from Last 3 Encounters:  12/04/19 82.1 kg  11/13/19 85 kg  11/05/19 84.8 kg     Intake/Output Summary (Last 24 hours) at 12/05/2019 1117 Last data filed at 12/05/2019 1022 Gross per 24 hour  Intake 583 ml  Output 750 ml  Net -167 ml     Physical Exam Gen Exam:Alert awake-not in any distress HEENT:atraumatic, normocephalic Chest: B/L clear to auscultation anteriorly CVS:S1S2 regular Abdomen:soft non tender, non distended Extremities:no edema Neurology: Non focal Skin: no rash   Data Review:    CBC Recent Labs  Lab 12/03/19 1213 12/04/19 0406 12/05/19 0256  WBC 5.9 4.3 7.4  HGB 13.2 14.1 14.8  HCT 42.2 46.0 46.9*  PLT 131* 147* 178  MCV 100.5* 99.1 97.1  MCH 31.4 30.4 30.6  MCHC 31.3 30.7 31.6  RDW 15.0 14.7 14.5  LYMPHSABS  --  0.8 1.4  MONOABS  --  0.1 0.5  EOSABS  --  0.0 0.0  BASOSABS  --  0.0 0.0    Chemistries  Recent Labs  Lab 12/03/19 1213 12/04/19 0406 12/05/19 0256  NA 141 140 138  K 3.7 4.3 4.3  CL 102 102 101  CO2 29 27 23   GLUCOSE 154* 191* 208*  BUN 10 10 20   CREATININE 1.12* 0.88 0.88  CALCIUM 8.6* 8.4* 8.8*  MG  --  1.7 1.7  AST 43* 42* 43*  ALT 23 24 22   ALKPHOS 93 116 106  BILITOT 0.6 0.6 0.8   ------------------------------------------------------------------------------------------------------------------ No results for input(s): CHOL, HDL, LDLCALC, TRIG, CHOLHDL, LDLDIRECT in the last 72 hours.  Lab Results  Component Value Date   HGBA1C 8.3 (H) 11/05/2019   ------------------------------------------------------------------------------------------------------------------ No results for input(s): TSH, T4TOTAL, T3FREE, THYROIDAB in the last 72 hours.  Invalid input(s): FREET3 ------------------------------------------------------------------------------------------------------------------ Recent Labs    12/04/19 0406 12/05/19 0256  FERRITIN 1,219* 1,242*    Coagulation profile No  results for input(s): INR, PROTIME in the last 168 hours.  Recent Labs    12/04/19 0406 12/05/19 0256  DDIMER 3.34* 2.99*    Cardiac Enzymes No results for input(s): CKMB, TROPONINI, MYOGLOBIN in the last 168 hours.  Invalid input(s): CK ------------------------------------------------------------------------------------------------------------------    Component Value Date/Time   BNP 142.5 (H) 12/03/2019 1804    Micro Results Recent Results (from the past 240 hour(s))  SARS CORONAVIRUS 2 (TAT 6-24 HRS) Nasopharyngeal Nasopharyngeal Swab     Status: Abnormal   Collection Time: 12/03/19  4:30 PM   Specimen: Nasopharyngeal Swab  Result Value Ref Range Status   SARS Coronavirus 2 POSITIVE (A) NEGATIVE Final    Comment: RESULT CALLED TO, READ BACK BY AND VERIFIED WITH: KOTT G, RN AT 2236 ON 12/03/2019 BY SAINVILUS S (NOTE) SARS-CoV-2 target nucleic acids are DETECTED. The SARS-CoV-2 RNA is generally detectable in upper and lower respiratory specimens during the acute phase of infection. Positive results are indicative of the presence of SARS-CoV-2 RNA. Clinical correlation with patient history and  other diagnostic information is  necessary to determine patient infection status. Positive results do not rule out bacterial infection or co-infection with other viruses.  The expected result is Negative. Fact Sheet for Patients: SugarRoll.be Fact Sheet for Healthcare Providers: https://www.woods-mathews.com/ This test is not yet approved or cleared by the [place] FDA and  has been authorized for detection and/or diagnosis of SARS-CoV-2 by FDA under an Emergency Use Authorization (EUA). This EUA will remain  in effect (meaning this test can be u sed) for the duration of the COVID-19 declaration under Section 564(b)(1) of the Act, 21 U.S.C. section 360bbb-3(b)(1), unless the authorization is terminated or revoked sooner. Performed at  [facility], [place] [address], [place], [place] [address]     Radiology Reports DG Chest 2 View  Result Date: 12/03/2019 CLINICAL DATA:  Cough, shortness of breath, COVID positive on 11/25/2019 EXAM: CHEST - 2 VIEW COMPARISON:  06/12/2019 and 03/03/2019 chest radiograph. 07/11/2018 CT chest. FINDINGS: Left chest pacing device with leads terminating over the right heart. Mild lung hypoinflation with bibasilar atelectasis. No pneumothorax or pleural effusion. Cardiomediastinal silhouette including aortic atherosclerotic calcifications are unchanged. Multilevel spondylosis.  Right upper quadrant surgical clips. IMPRESSION: No acute airspace disease.  Minimal bibasilar atelectasis. Electronically Signed   By: Primitivo Gauze M.D.   On: 12/03/2019 12:48   CUP PACEART REMOTE DEVICE CHECK  Result Date: 11/28/2019 Scheduled remote reviewed. Normal device function.  Next remote 91 days. Felisa Bonier, RN, MSN

## 2019-12-05 NOTE — Discharge Instructions (Addendum)
Person Under Monitoring Name: [name]  Location: [place] [address]   Infection Prevention Recommendations for Individuals Confirmed to have, or Being Evaluated for, 2019 Novel Coronavirus (COVID-19) Infection Who Receive Care at Home  Individuals who are confirmed to have, or are being evaluated for, COVID-19 should follow the prevention steps below until a healthcare provider or local or state health department says they can return to normal activities.  Stay home except to get medical care You should restrict activities outside your home, except for getting medical care. Do not go to work, school, or public areas, and do not use public transportation or taxis.  Call ahead before visiting your doctor Before your medical appointment, call the healthcare provider and tell them that you have, or are being evaluated for, COVID-19 infection. This will help the healthcare provider's office take steps to keep other people from getting infected. Ask your healthcare provider to call the local or state health department.  Monitor your symptoms Seek prompt medical attention if your illness is worsening (e.g., difficulty breathing). Before going to your medical appointment, call the healthcare provider and tell them that you have, or are being evaluated for, COVID-19 infection. Ask your healthcare provider to call the local or state health department.  Wear a facemask You should wear a facemask that covers your nose and mouth when you are in the same room with other people and when you visit a healthcare provider. People who live with or visit you should also wear a facemask while they are in the same room with you.  Separate yourself from other people in your home As much as possible, you should stay in a different room from other people in your home. Also, you should use a separate bathroom, if available.  Avoid sharing household items You should not share  dishes, drinking glasses, cups, eating utensils, towels, bedding, or other items with other people in your home. After using these items, you should wash them thoroughly with soap and water.  Cover your coughs and sneezes Cover your mouth and nose with a tissue when you cough or sneeze, or you can cough or sneeze into your sleeve. Throw used tissues in a lined trash can, and immediately wash your hands with soap and water for at least 20 seconds or use an alcohol-based hand rub.  Wash your [facility] your hands often and thoroughly with soap and water for at least 20 seconds. You can use an alcohol-based hand sanitizer if soap and water are not available and if your hands are not visibly dirty. Avoid touching your eyes, nose, and mouth with unwashed hands.   Prevention Steps for Caregivers and Household Members of Individuals Confirmed to have, or Being Evaluated for, COVID-19 Infection Being Cared for in the Home  If you live with, or provide care at home for, a person confirmed to have, or being evaluated for, COVID-19 infection please follow these guidelines to prevent infection:  Follow healthcare provider's instructions Make sure that you understand and can help the patient follow any healthcare provider instructions for all care.  Provide for the patient's basic needs You should help the patient with basic needs in the home and provide support for getting groceries, prescriptions, and other personal needs.  Monitor the patient's symptoms If they are getting sicker, call his or her medical provider and tell them that the patient has, or is being evaluated for, COVID-19 infection. This will help the healthcare provider's office  take steps to keep other people from getting infected. Ask the healthcare provider to call the local or state health department.  Limit the number of people who have contact with the patient  If possible, have only one caregiver for the patient.  Other  household members should stay in another home or place of residence. If this is not possible, they should stay  in another room, or be separated from the patient as much as possible. Use a separate bathroom, if available.  Restrict visitors who do not have an essential need to be in the home.  Keep older adults, very young children, and other sick people away from the patient Keep older adults, very young children, and those who have compromised immune systems or chronic health conditions away from the patient. This includes people with chronic heart, lung, or kidney conditions, diabetes, and cancer.  Ensure good ventilation Make sure that shared spaces in the home have good air flow, such as from an air conditioner or an opened window, weather permitting.  Wash your hands often  Wash your hands often and thoroughly with soap and water for at least 20 seconds. You can use an alcohol based hand sanitizer if soap and water are not available and if your hands are not visibly dirty.  Avoid touching your eyes, nose, and mouth with unwashed hands.  Use disposable paper towels to dry your hands. If not available, use dedicated cloth towels and replace them when they become wet.  Wear a facemask and gloves  Wear a disposable facemask at all times in the room and gloves when you touch or have contact with the patient's blood, body fluids, and/or secretions or excretions, such as sweat, saliva, sputum, nasal mucus, vomit, urine, or feces.  Ensure the mask fits over your nose and mouth tightly, and do not touch it during use.  Throw out disposable facemasks and gloves after using them. Do not reuse.  Wash your hands immediately after removing your facemask and gloves.  If your personal clothing becomes contaminated, carefully remove clothing and launder. Wash your hands after handling contaminated clothing.  Place all used disposable facemasks, gloves, and other waste in a lined container before  disposing them with other household waste.  Remove gloves and wash your hands immediately after handling these items.  Do not share dishes, glasses, or other household items with the patient  Avoid sharing household items. You should not share dishes, drinking glasses, cups, eating utensils, towels, bedding, or other items with a patient who is confirmed to have, or being evaluated for, COVID-19 infection.  After the person uses these items, you should wash them thoroughly with soap and water.  Wash laundry thoroughly  Immediately remove and wash clothes or bedding that have blood, body fluids, and/or secretions or excretions, such as sweat, saliva, sputum, nasal mucus, vomit, urine, or feces, on them.  Wear gloves when handling laundry from the patient.  Read and follow directions on labels of laundry or clothing items and detergent. In general, wash and dry with the warmest temperatures recommended on the label.  Clean all areas the individual has used often  Clean all touchable surfaces, such as counters, tabletops, doorknobs, bathroom fixtures, toilets, phones, keyboards, tablets, and bedside tables, every day. Also, clean any surfaces that may have blood, body fluids, and/or secretions or excretions on them.  Wear gloves when cleaning surfaces the patient has come in contact with.  Use a diluted bleach solution (e.g., dilute bleach with 1 part  bleach and 10 parts water) or a household disinfectant with a label that says EPA-registered for coronaviruses. To make a bleach solution at home, add 1 tablespoon of bleach to 1 quart (4 cups) of water. For a larger supply, add  cup of bleach to 1 gallon (16 cups) of water.  Read labels of cleaning products and follow recommendations provided on product labels. Labels contain instructions for safe and effective use of the cleaning product including precautions you should take when applying the product, such as wearing gloves or eye protection  and making sure you have good ventilation during use of the product.  Remove gloves and wash hands immediately after cleaning.  Monitor yourself for signs and symptoms of illness Caregivers and household members are considered close contacts, should monitor their health, and will be asked to limit movement outside of the home to the extent possible. Follow the monitoring steps for close contacts listed on the symptom monitoring form.   ? If you have additional questions, contact your local health department or call the epidemiologist on call at [phone] (available 24/7). ? This guidance is subject to change. For the most up-to-date guidance from [facility], please refer to their website: YouBlogs.pl    Information on my medicine - ELIQUIS (apixaban)  Why was Eliquis prescribed for you? Eliquis was prescribed for you to reduce the risk of a blood clot forming that can cause a stroke if you have a medical condition called atrial fibrillation (a type of irregular heartbeat).  What do You need to know about Eliquis ? Take your Eliquis TWICE DAILY - one tablet in the morning and one tablet in the evening with or without food. If you have difficulty swallowing the tablet whole please discuss with your pharmacist how to take the medication safely.  Take Eliquis exactly as prescribed by your doctor and DO NOT stop taking Eliquis without talking to the doctor who prescribed the medication.  Stopping may increase your risk of developing a stroke.  Refill your prescription before you run out.  After discharge, you should have regular check-up appointments with your healthcare provider that is prescribing your Eliquis.  In the future your dose may need to be changed if your kidney function or weight changes by a significant amount or as you get older.  What do you do if you miss a dose? If you miss a dose, take it as soon as you  remember on the same day and resume taking twice daily.  Do not take more than one dose of ELIQUIS at the same time to make up a missed dose.  Important Safety Information A possible side effect of Eliquis is bleeding. You should call your healthcare provider right away if you experience any of the following: ? Bleeding from an injury or your nose that does not stop. ? Unusual colored urine (red or dark brown) or unusual colored stools (red or black). ? Unusual bruising for unknown reasons. ? A serious fall or if you hit your head (even if there is no bleeding).  Some medicines may interact with Eliquis and might increase your risk of bleeding or clotting while on Eliquis. To help avoid this, consult your healthcare provider or pharmacist prior to using any new prescription or non-prescription medications, including herbals, vitamins, non-steroidal anti-inflammatory drugs (NSAIDs) and supplements.  This website has more information on Eliquis (apixaban): http://www.eliquis.com/eliquis/home

## 2019-12-05 NOTE — Plan of Care (Signed)

## 2019-12-05 NOTE — Progress Notes (Signed)
Physical Therapy Evaluation  Patient presents slightly below her baseline level of mobility. She is supervision for ambulation down the hallway on 2L with light UE support intermittently on railing in hallway toward end of gait trial. No LOB noted. Patient ambulates with slower gait speed. Oxygen saturation 91% on room air at rest. Anticipate patient will be able to wean down on suppl oxygen needs and progress her mobility in order to discharge home with her family. Her family has all had COVID and recovered so they would be able to be home with her and care for her as needed.    12/05/19 1054  PT Visit Information  Last PT Received On 12/05/19  Assistance Needed +1  History of Present Illness 74 year old female admitted 12/03/19 with SOB, cough, malaise, diarrhea, known +COVID 11/24/19. O2 saturations as low as 87% on room air. Patient with acute hypoxic respiratory failure secondary to COVID being treated wtih IV Remdesivir and steroids. CXR: minimal bibasilar atelectasis. PMH: paroxysmal atrial fibrillation on chronic anticoagulation, hypothyroidism, DM type II, rheumatoid arthritis, GERD  Precautions  Precautions Fall  Precaution Comments +COVID  Restrictions  Weight Bearing Restrictions No  Home Living  Family/patient expects to be discharged to: Private residence  Living Arrangements Other (Comment) (grandchildren and dtr)  Available Help at Discharge Family  Type of [place] entrance  Home Layout Two level;Able to live on main level with bedroom/bathroom (patient's bedroom and bathroom are on 1st floor)  Bathroom Shower/Tub Tub/shower unit  Tax adviser - 4 wheels;Shower seat (unsure if she has RW still from her TKAs)  Additional Comments dtr is home during the day, grandchildren work, Water engineer does cooking and cleaning  Prior Function  Level of Independence Independent  Comments owns shower chair, unsure if she still has  RW, has 4WW, does not use DME at baseline, no falls this year  Communication  Communication No difficulties  Pain Assessment  Pain Assessment No/denies pain  Cognition  Arousal/Alertness Awake/alert  Behavior During Therapy WFL for tasks assessed/performed  Overall Cognitive Status Within Functional Limits for tasks assessed  Lower Extremity Assessment  Lower Extremity Assessment Overall WFL for tasks assessed  Bed Mobility  General bed mobility comments Patient up in recliner chair upon PT entrance  Transfers  Overall transfer level Needs assistance  Equipment used None  Transfers Sit to/from Stand  Sit to Stand Supervision;Modified independent (Device/Increase time)  Ambulation/Gait  Ambulation/Gait assistance Supervision  Gait Distance (Feet) 20 Feet (100)  Assistive device None  Gait Pattern/deviations Step-through pattern  General Gait Details Patient ambulated in room on room air trial 1 approx 20 ft with oxygen saturation 91% post, HR 81 bpm but down to 87% with seated recovery on room air. 2L [place] for ambulation in hallway approx 135ft with oxygen saturatio n 93%, HR 83 bpm.   Gait velocity decreased  Balance  Overall balance assessment No apparent balance deficits (not formally assessed)  General Comments  General comments (skin integrity, edema, etc.) Patient on room air at end of session, nurse aware, oxygen saturation 91%, HR 65 bpm.  Exercises  Exercises Other exercises  Other Exercises  Other Exercises Patient reports she just did flutter and IS. Education on technique and purpose of each.  PT - End of Session  Equipment Utilized During Treatment Gait belt;Oxygen  Activity Tolerance Patient tolerated treatment well  Patient left in chair;with call bell/phone within reach  Nurse Communication Mobility status;Other (comment) (O2 saturation  response)  PT Assessment  PT Recommendation/Assessment Patient needs continued PT services  PT Visit Diagnosis Other  abnormalities of gait and mobility (R26.89);Difficulty in walking, not elsewhere classified (R26.2)  PT Problem List Decreased strength;Decreased activity tolerance;Decreased balance;Decreased mobility;Cardiopulmonary status limiting activity  Barriers to Discharge Comments no anticipated barriers  PT Plan  PT Frequency (ACUTE ONLY) Min 3X/week  PT Treatment/Interventions (ACUTE ONLY) DME instruction;Gait training;Functional mobility training;Therapeutic activities;Therapeutic exercise;Balance training;Patient/family education  AM-PAC PT "6 Clicks" Mobility Outcome Measure (Version 2)  Help needed turning from your back to your side while in a flat bed without using bedrails? 4  Help needed moving from lying on your back to sitting on the side of a flat bed without using bedrails? 4  Help needed moving to and from a bed to a chair (including a wheelchair)? 4  Help needed standing up from a chair using your arms (e.g., wheelchair or bedside chair)? 4  Help needed to walk in hospital room? 3  Help needed climbing 3-5 steps with a railing?  3  6 Click Score 22  Consider Recommendation of Discharge To: Home with no services  PT Recommendation  Follow Up Recommendations No PT follow up;Supervision - Intermittent  PT equipment None recommended by PT  Individuals Consulted  Consulted and Agree with Results and Recommendations Patient  Acute Rehab PT Goals  Patient Stated Goal taking care of self completely  PT Goal Formulation With patient  Time For Goal Achievement 12/18/19  Potential to Achieve Goals Good  PT Time Calculation  PT Start Time (ACUTE ONLY) 1054  PT Stop Time (ACUTE ONLY) 1120  PT Time Calculation (min) (ACUTE ONLY) 26 min  PT General Charges  $$ ACUTE PT VISIT 1 Visit  PT Evaluation  $PT Eval Moderate Complexity 1 Mod   Birdie Hopes, PT, DPT Acute Rehab [phone] office

## 2019-12-05 NOTE — Progress Notes (Signed)
Inpatient Diabetes Program Recommendations  AACE/ADA: New Consensus Statement on Inpatient Glycemic Control (2015)  Target Ranges:  Prepandial:   less than 140 mg/dL      Peak postprandial:   less than 180 mg/dL (1-2 hours)      Critically ill patients:  140 - 180 mg/dL   Lab Results  Component Value Date   GLUCAP 265 (H) 12/05/2019   HGBA1C 8.3 (H) 11/05/2019    Review of Glycemic Control Results for [name], [name] (MRN [id number]) as of 12/05/2019 13:34  Ref. Range 12/04/2019 12:56 12/04/2019 15:43 12/04/2019 21:06 12/05/2019 07:43 12/05/2019 12:01  Glucose-Capillary Latest Ref Range: 70 - 99 mg/dL 180 (H) 180 (H) 206 (H) 193 (H) 265 (H)   Diabetes history: DM 2 Outpatient Diabetes medications: Metformin 500 bid Current orders for Inpatient glycemic control:  Novolog 0-15 units tid + hs  Decadron 6 mg Q24 hours BUN/Creat: WNL  Inpatient Diabetes Program Recommendations:    Consider Levemir 8-10 units Daily  Thanks,  Tama Headings RN, MSN, BC-ADM Inpatient Diabetes Coordinator Team Pager [phone] (8a-5p)

## 2019-12-06 LAB — COMPREHENSIVE METABOLIC PANEL
ALT: 20 U/L (ref 0–44)
AST: 41 U/L (ref 15–41)
Albumin: 2.4 g/dL — ABNORMAL LOW (ref 3.5–5.0)
Alkaline Phosphatase: 92 U/L (ref 38–126)
Anion gap: 12 (ref 5–15)
BUN: 31 mg/dL — ABNORMAL HIGH (ref 8–23)
CO2: 23 mmol/L (ref 22–32)
Calcium: 8.5 mg/dL — ABNORMAL LOW (ref 8.9–10.3)
Chloride: 101 mmol/L (ref 98–111)
Creatinine, Ser: 1.07 mg/dL — ABNORMAL HIGH (ref 0.44–1.00)
GFR calc Af Amer: 59 mL/min — ABNORMAL LOW (ref >60)
GFR calc non Af Amer: 51 mL/min — ABNORMAL LOW (ref >60)
Glucose, Bld: 287 mg/dL — ABNORMAL HIGH (ref 70–99)
Potassium: 4.7 mmol/L (ref 3.5–5.1)
Sodium: 136 mmol/L (ref 135–145)
Total Bilirubin: 1.1 mg/dL (ref 0.3–1.2)
Total Protein: 5.8 g/dL — ABNORMAL LOW (ref 6.5–8.1)

## 2019-12-06 LAB — CBC WITH DIFFERENTIAL/PLATELET
Abs Immature Granulocytes: 0 10*3/uL (ref 0.00–0.07)
Basophils Absolute: 0 10*3/uL (ref 0.0–0.1)
Basophils Relative: 0 %
Eosinophils Absolute: 0 10*3/uL (ref 0.0–0.5)
Eosinophils Relative: 0 %
HCT: 43.5 % (ref 36.0–46.0)
Hemoglobin: 13.9 g/dL (ref 12.0–15.0)
Lymphocytes Relative: 4 %
Lymphs Abs: 0.4 10*3/uL — ABNORMAL LOW (ref 0.7–4.0)
MCH: 31 pg (ref 26.0–34.0)
MCHC: 32 g/dL (ref 30.0–36.0)
MCV: 96.9 fL (ref 80.0–100.0)
Monocytes Absolute: 0.7 10*3/uL (ref 0.1–1.0)
Monocytes Relative: 6 %
Neutro Abs: 9.9 10*3/uL — ABNORMAL HIGH (ref 1.7–7.7)
Neutrophils Relative %: 90 %
Platelets: 199 10*3/uL (ref 150–400)
RBC: 4.49 MIL/uL (ref 3.87–5.11)
RDW: 14.5 % (ref 11.5–15.5)
WBC: 11 10*3/uL — ABNORMAL HIGH (ref 4.0–10.5)
nRBC: 0 % (ref 0.0–0.2)
nRBC: 1 /100 WBC — ABNORMAL HIGH

## 2019-12-06 LAB — C-REACTIVE PROTEIN: CRP: 3.2 mg/dL — ABNORMAL HIGH (ref <1.0)

## 2019-12-06 LAB — GLUCOSE, CAPILLARY
Glucose-Capillary: 285 mg/dL — ABNORMAL HIGH (ref 70–99)
Glucose-Capillary: 345 mg/dL — ABNORMAL HIGH (ref 70–99)
Glucose-Capillary: 244 mg/dL — ABNORMAL HIGH (ref 70–99)
Glucose-Capillary: 247 mg/dL — ABNORMAL HIGH (ref 70–99)

## 2019-12-06 LAB — MAGNESIUM: Magnesium: 2.2 mg/dL (ref 1.7–2.4)

## 2019-12-06 LAB — D-DIMER, QUANTITATIVE: D-Dimer, Quant: 2.76 ug/mL-FEU — ABNORMAL HIGH (ref 0.00–0.50)

## 2019-12-06 LAB — FERRITIN: Ferritin: 1205 ng/mL — ABNORMAL HIGH (ref 11–307)

## 2019-12-06 MED ORDER — INSULIN GLARGINE 100 UNIT/ML ~~LOC~~ SOLN
10.0000 [IU] | Freq: Every day | SUBCUTANEOUS | Status: DC
  Administered 2019-12-06 – 2019-12-07 (×2): 10 [IU] via SUBCUTANEOUS
  Filled 2019-12-06 (×2): qty 0.1

## 2019-12-06 MED ORDER — PHENOL 1.4 % MT LIQD
1.0000 | OROMUCOSAL | Status: DC | PRN
  Filled 2019-12-06: qty 177

## 2019-12-06 NOTE — Progress Notes (Signed)
Inpatient Diabetes Program Recommendations  AACE/ADA: New Consensus Statement on Inpatient Glycemic Control (2015)  Target Ranges:  Prepandial:   less than 140 mg/dL      Peak postprandial:   less than 180 mg/dL (1-2 hours)      Critically ill patients:  140 - 180 mg/dL   Lab Results  Component Value Date   GLUCAP 285 (H) 12/06/2019   HGBA1C 8.3 (H) 11/05/2019    Review of Glycemic Control Results for [name], [name] (MRN [id number]) as of 12/06/2019 10:26  Ref. Range 12/05/2019 12:01 12/05/2019 15:59 12/05/2019 21:23 12/06/2019 07:35  Glucose-Capillary Latest Ref Range: 70 - 99 mg/dL 265 (H) 211 (H) 236 (H) 285 (H)   Diabetes history: DM 2 Outpatient Diabetes medications: Metformin 500 bid Current orders for Inpatient glycemic control:  Novolog 0-15 units tid + hs  Decadron 6 mg Q24 hours BUN/Creat: WNL  Inpatient Diabetes Program Recommendations:    Consider Levemir 6 units BID (to start this AM).   Thanks, Bronson Curb, MSN, RNC-OB Diabetes Coordinator [phone] (8a-5p)

## 2019-12-06 NOTE — Progress Notes (Signed)
Occupational Therapy Evaluation Patient Details Name: [name] MRN: [id number] DOB: 02-22-1946 Today's Date: 12/06/2019    History of Present Illness 74 year old female admitted 12/03/19 with SOB, cough, malaise, diarrhea, known +COVID 11/24/19. O2 saturations as low as 87% on room air. Patient with acute hypoxic respiratory failure secondary to COVID being treated wtih IV Remdesivir and steroids. CXR: minimal bibasilar atelectasis. PMH: paroxysmal atrial fibrillation on chronic anticoagulation, hypothyroidism, DM type II, rheumatoid arthritis, GERD   Clinical Impression   PTA, pt lives with family and was Independent with ADLs and mobility in the home. Family assists with IADLs as needed. Pt Supervision for transfers and mobility without AD. Pt desats to 88% with hallway distance ambulation on RA, but recovers to low 90s within 15 seconds. Pt Supervision for toileting task without AD, no LOB. Pt Modified Independent for grooming tasks seated and standing at sink. Pt overall Setup to Independent for UB ADLs, Min A for LB bathing/dressing due to decreased ability to reach B feet (will re assess from lower surface height). No OT needs anticipated at DC. Pt has good family support at home (who have all had and recovered from [place]).     Follow Up Recommendations  No OT follow up;Supervision - Intermittent    Equipment Recommendations  None recommended by OT    Recommendations for Other Services       Precautions / Restrictions Precautions Precautions: Fall Precaution Comments: +COVID Restrictions Weight Bearing Restrictions: No      Mobility Bed Mobility               General bed mobility comments: up in recliner   Transfers Overall transfer level: Needs assistance Equipment used: None Transfers: Sit to/from Stand;Stand Pivot Transfers Sit to Stand: Independent Stand pivot transfers: Supervision            Balance Overall balance assessment: No apparent balance  deficits (not formally assessed)                                         ADL either performed or assessed with clinical judgement   ADL Overall ADL's : Needs assistance/impaired Eating/Feeding: Independent;Sitting   Grooming: Modified independent;Standing;Sitting;Wash/dry hands;Wash/dry face;Oral care;Applying deodorant Grooming Details (indicate cue type and reason): Modified Independent sitting and standing at sink without AD Upper Body Bathing: Set up;Sitting   Lower Body Bathing: Minimal assistance;Sit to/from stand;Sitting/lateral leans Lower Body Bathing Details (indicate cue type and reason): Min A for bathing B feet sitting at sink Upper Body Dressing : Modified independent;Sitting   Lower Body Dressing: Minimal assistance;Sit to/from stand;Sitting/lateral leans Lower Body Dressing Details (indicate cue type and reason): Min A to don socks, unable to reach Toilet Transfer: Supervision/safety;Ambulation;Regular Toilet   Toileting- Clothing Manipulation and Hygiene: Modified independent;Sit to/from stand;Sitting/lateral lean       Functional mobility during ADLs: Supervision/safety General ADL Comments: Min A at most for ADLs assessed on eval due to difficulty reaching feet. pt with minimal safety concerns, no LOB. Minor limitations due to decreased endurance     Vision         Perception     Praxis      Pertinent Vitals/Pain Pain Assessment: No/denies pain     Hand Dominance Right   Extremity/Trunk Assessment Upper Extremity Assessment Upper Extremity Assessment: Overall WFL for tasks assessed   Lower Extremity Assessment Lower Extremity Assessment: Defer to PT evaluation  Communication Communication Communication: No difficulties   Cognition Arousal/Alertness: Awake/alert Behavior During Therapy: WFL for tasks assessed/performed Overall Cognitive Status: Within Functional Limits for tasks assessed                                      General Comments  Pt on RA, desats to 88% at most with ambulation, recovers within 15 seconds to low 90s    Exercises     Shoulder Instructions      Home Living Family/patient expects to be discharged to:: Private residence Living Arrangements: Other relatives Available Help at Discharge: Family Type of Home: House Home Access: Ramped entrance     [place]: Two level;Able to live on main level with bedroom/bathroom Alternate Level Stairs-Number of Steps: can stay on main   Bathroom Shower/Tub: Tub/shower unit   Bathroom Toilet: Standard     Home Equipment: Environmental consultant - 4 wheels;Shower seat   Additional Comments: dtr is home during the day, grandchildren work, Water engineer does cooking and cleaning      Prior Functioning/Environment Level of Independence: Independent        Comments: owns shower chair, unsure if she still has RW, has 4WW, does not use DME at baseline, no falls this year        OT Problem List: Decreased activity tolerance;Cardiopulmonary status limiting activity      OT Treatment/Interventions: Self-care/ADL training;Therapeutic exercise;Energy conservation;Therapeutic activities;Patient/family education    OT Goals(Current goals can be found in the care plan section) Acute Rehab OT Goals Patient Stated Goal: be able to go home soon OT Goal Formulation: With patient Time For Goal Achievement: 12/20/19 Potential to Achieve Goals: Good ADL Goals Pt Will Perform Lower Body Bathing: with modified independence;sit to/from stand;sitting/lateral leans Pt Will Perform Lower Body Dressing: with modified independence;sit to/from stand;sitting/lateral leans Pt Will Transfer to Toilet: Independently;ambulating;regular height toilet Additional ADL Goal #1: Pt to demonstrate ability to gather ADL items in room with Modified Independence, reaching above and below base of support without LOB in order to maximize safety and independence with ADLs.   OT Frequency: Min 2X/week   Barriers to D/C:            Co-evaluation              AM-PAC OT "6 Clicks" Daily Activity     Outcome Measure Help from another person eating meals?: None Help from another person taking care of personal grooming?: None Help from another person toileting, which includes using toliet, bedpan, or urinal?: A Little Help from another person bathing (including washing, rinsing, drying)?: A Little Help from another person to put on and taking off regular upper body clothing?: None Help from another person to put on and taking off regular lower body clothing?: A Little 6 Click Score: 21   End of Session Equipment Utilized During Treatment: Gait belt  Activity Tolerance: Patient tolerated treatment well Patient left: in chair;with call bell/phone within reach  OT Visit Diagnosis: Other (comment)(decreased cardiopulmonary status)                Time: [id number] OT Time Calculation (min): 41 min Charges:  OT General Charges $OT Visit: 1 Visit OT Evaluation $OT Eval Moderate Complexity: 1 Mod OT Treatments $Self Care/Home Management : 23-37 mins  Layla Maw, OTR/L  Layla Maw 12/06/2019, 2:40 PM

## 2019-12-06 NOTE — Progress Notes (Signed)
PROGRESS NOTE                                                                                                                                                                                                             Patient Demographics:    [name], is a 74 y.o. female, DOB - November 18, 1945, [id number]  Outpatient Primary MD for the patient is Buelah Manis, Modena Nunnery, MD   Admit date - 12/03/2019   LOS - 3  Chief Complaint  Patient presents with  . Covid Exposure  . Cough  . Shortness of Breath       Brief Narrative: Patient is a 74 y.o. female with PMHx of PAF, rheumatoid arthritis, hypothyroidism, DM-2 who was diagnosed with COVID-19 on 4/3-presented with worsening cough, shortness of breath-further evaluation revealed acute hypoxic respiratory failure secondary to COVID-19 pneumonia.  See below for further details.  Significant Events: 4/3>> diagnosed with COVID-19 4/12>> admit to [facility] for hypoxia from COVID-19  COVID-19 medications: Steroids: 4/12>> Remdesivir:4/12>>  Antibiotics: None  Microbiology data: None  DVT prophylaxis: Eliquis  Procedures: None  Consults: None    Subjective:   Feels better-titrated off oxygen this morning.  Ambulated from the bathroom to her chair without any assistance this morning.  Coughing continues.   Assessment  & Plan :   Acute Hypoxic Resp Failure due to Covid 19 Viral pneumonia: Improved-titrated to room air this morning.  Improved-titrated down to 2 L of oxygen this morning-CRP continues to downtrend.  Continue steroids/remdesivir.  If clinical improvement continues-can consider discharge over the next few days.    May need home O2 on discharge-need to assess prior to discharge  Fever: afebrile  O2 requirements:  SpO2: 92 % O2 Flow Rate (L/min): 3 L/min FiO2 (%): (!) 2 %   COVID-19 Labs: Recent Labs    12/03/19 1804 12/03/19 1804 12/04/19 0406  12/05/19 0256 12/06/19 0319 12/06/19 0516  DDIMER 2.93*   < > 3.34* 2.99*  --  2.76*  FERRITIN 1,226*   < > 1,219* 1,242* 1,205*  --   LDH 294*  --   --   --   --   --   CRP 9.2*   < > 9.9* 8.1* 3.2*  --    < > = values in this interval not displayed.       Component  Value Date/Time   BNP 142.5 (H) 12/03/2019 1804    Recent Labs  Lab 12/03/19 1804  PROCALCITON <0.10    Lab Results  Component Value Date   SARSCOV2NAA POSITIVE (A) 12/03/2019   SARSCOV2NAA NOT DETECTED 06/08/2019   [place] NEGATIVE 03/03/2019     Prone/Incentive Spirometry: encouraged  incentive spirometry use 3-4/hour  AKI: Likely hemodynamically mediated-resolved with supportive care.  HTN: Controlled-continue Coreg, Cardizem, Lasix and losartan  DM-2 with uncontrolled hyperglycemia secondary to steroids (A1c 8.3 on 11/05/2019): CBGs remain on the higher side-start Lantus 10 units daily-continue SSI-follow and adjust accordingly.   CBG (last 3)  Recent Labs    12/05/19 1559 12/05/19 2123 12/06/19 0735  GLUCAP 211* 236* 285*   Hypothyroidism: Continue with Synthroid  GERD: Continue with PPI  Rheumatoid arthritis: Hold Arava-patient on steroids.  She is also on Enbrel at home  PAF: Sinus rhythm on telemetry-continue Eliquis.  On Cardizem and Coreg.  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   Weight as of this encounter: 82.1 kg.   ABG:    Component Value Date/Time   PHART 7.359 10/15/2013 0505   PCO2ART 38.4 10/15/2013 0505   PO2ART 64.0 (L) 10/15/2013 0505   HCO3 27.6 03/03/2019 1138   TCO2 29 03/03/2019 1138   ACIDBASEDEF 3.0 (H) 10/15/2013 0505   O2SAT 100.0 03/03/2019 1138    Vent Settings: N/A   Condition - Stable  Family Communication  : Attempted to call daughter-unable to get in touch with her  Code Status :   Full Code  Diet :  Diet Order            Diet heart healthy/carb modified Room service appropriate? Yes; Fluid consistency: Thin  Diet effective now               Disposition Plan  : Home possibly on 4/16 if clinical improvement continues.  Barriers to discharge: Hypoxia requiring O2 supplementation/complete 5 days of IV Remdesivir  Antimicorbials  :    Anti-infectives (From admission, onward)   Start     Dose/Rate Route Frequency Ordered Stop   12/04/19 1000  remdesivir 100 mg in sodium chloride 0.9 % 100 mL IVPB     100 mg 200 mL/hr over 30 Minutes Intravenous Daily 12/03/19 1652 12/08/19 0959   12/03/19 1800  remdesivir 200 mg in sodium chloride 0.9% 250 mL IVPB     200 mg 580 mL/hr over 30 Minutes Intravenous Once 12/03/19 1652 12/03/19 1950      Inpatient Medications  Scheduled Meds: . apixaban  5 mg Oral BID  . vitamin C  500 mg Oral Daily  . atorvastatin  10 mg Oral Daily  . carvedilol  12.5 mg Oral BID WC  . dexamethasone  6 mg Oral Q24H  . diltiazem  360 mg Oral Daily  . furosemide  40 mg Oral QODAY  . insulin aspart  0-15 Units Subcutaneous TID WC  . insulin aspart  0-5 Units Subcutaneous QHS  . levothyroxine  50 mcg Oral QAC breakfast  . losartan  50 mg Oral Daily  . pantoprazole  40 mg Oral Daily  . potassium chloride  10 mEq Oral Daily  . sodium chloride flush  3 mL Intravenous Once  . sodium chloride flush  3 mL Intravenous Q12H  . zinc sulfate  220 mg Oral Daily  Continuous Infusions: . remdesivir 100 mg in NS 100 mL 100 mg (12/06/19 0951)   PRN Meds:.acetaminophen, chlorpheniramine-HYDROcodone, diclofenac Sodium, guaiFENesin-dextromethorphan, methocarbamol   Time Spent in minutes  25  See all Orders from today for further details   Oren Binet M.D on 12/06/2019 at 11:27 AM  To page go to [email or web address] - use universal password  [facility] -  Office  [phone]    Objective:   Vitals:   12/05/19 1715 12/05/19 2120  12/06/19 0514 12/06/19 0859  BP: (!) 118/55 114/60 (!) 152/75 (!) 144/63  Pulse: 62 60 66 75  Resp:  12 19 16   Temp:  97.6 F (36.4 C) 97.7 F (36.5 C) 98.3 F (36.8 C)  TempSrc:  Oral Oral Oral  SpO2:  92% 91% 92%  Weight:      Height:        Wt Readings from Last 3 Encounters:  12/04/19 82.1 kg  11/13/19 85 kg  11/05/19 84.8 kg     Intake/Output Summary (Last 24 hours) at 12/06/2019 1127 Last data filed at 12/06/2019 0951 Gross per 24 hour  Intake 293 ml  Output --  Net 293 ml     Physical Exam Gen Exam:Alert awake-not in any distress HEENT:atraumatic, normocephalic Chest: B/L clear to auscultation anteriorly CVS:S1S2 regular Abdomen:soft non tender, non distended Extremities:no edema Neurology: Non focal Skin: no rash   Data Review:    CBC Recent Labs  Lab 12/03/19 1213 12/04/19 0406 12/05/19 0256 12/06/19 0319  WBC 5.9 4.3 7.4 11.0*  HGB 13.2 14.1 14.8 13.9  HCT 42.2 46.0 46.9* 43.5  PLT 131* 147* 178 199  MCV 100.5* 99.1 97.1 96.9  MCH 31.4 30.4 30.6 31.0  MCHC 31.3 30.7 31.6 32.0  RDW 15.0 14.7 14.5 14.5  LYMPHSABS  --  0.8 1.4 0.4*  MONOABS  --  0.1 0.5 0.7  EOSABS  --  0.0 0.0 0.0  BASOSABS  --  0.0 0.0 0.0    Chemistries  Recent Labs  Lab 12/03/19 1213 12/04/19 0406 12/05/19 0256 12/06/19 0319  NA 141 140 138 136  K 3.7 4.3 4.3 4.7  CL 102 102 101 101  CO2 29 27 23 23   GLUCOSE 154* 191* 208* 287*  BUN 10 10 20  31*  CREATININE 1.12* 0.88 0.88 1.07*  CALCIUM 8.6* 8.4* 8.8* 8.5*  MG  --  1.7 1.7 2.2  AST 43* 42* 43* 41  ALT 23 24 22 20   ALKPHOS 93 116 106 92  BILITOT 0.6 0.6 0.8 1.1   ------------------------------------------------------------------------------------------------------------------ No results for input(s): CHOL, HDL, LDLCALC, TRIG, CHOLHDL, LDLDIRECT in the last 72 hours.  Lab Results  Component Value Date   HGBA1C 8.3 (H) 11/05/2019    ------------------------------------------------------------------------------------------------------------------ No results for input(s): TSH, T4TOTAL, T3FREE, THYROIDAB in the last 72 hours.  Invalid input(s): FREET3 ------------------------------------------------------------------------------------------------------------------ Recent Labs    12/05/19 0256 12/06/19 0319  FERRITIN 1,242* 1,205*    Coagulation profile No results for input(s): INR, PROTIME in the last 168 hours.  Recent Labs    12/05/19 0256 12/06/19 0516  DDIMER 2.99* 2.76*    Cardiac Enzymes No results for input(s): CKMB, TROPONINI, MYOGLOBIN in the last 168 hours.  Invalid input(s): CK ------------------------------------------------------------------------------------------------------------------    Component Value Date/Time   BNP 142.5 (H) 12/03/2019 1804    Micro Results Recent Results (from the past 240 hour(s))  SARS CORONAVIRUS 2 (TAT 6-24 HRS) Nasopharyngeal Nasopharyngeal Swab     Status: Abnormal   Collection Time: 12/03/19  4:30 PM  Specimen: Nasopharyngeal Swab  Result Value Ref Range Status   SARS Coronavirus 2 POSITIVE (A) NEGATIVE Final    Comment: RESULT CALLED TO, READ BACK BY AND VERIFIED WITH: KOTT G, RN AT 2236 ON 12/03/2019 BY SAINVILUS S (NOTE) SARS-CoV-2 target nucleic acids are DETECTED. The SARS-CoV-2 RNA is generally detectable in upper and lower respiratory specimens during the acute phase of infection. Positive results are indicative of the presence of SARS-CoV-2 RNA. Clinical correlation with patient history and other diagnostic information is  necessary to determine patient infection status. Positive results do not rule out bacterial infection or co-infection with other viruses.  The expected result is Negative. Fact Sheet for Patients: SugarRoll.be Fact Sheet for Healthcare  Providers: https://www.woods-mathews.com/ This test is not yet approved or cleared by the [place] FDA and  has been authorized for detection and/or diagnosis of SARS-CoV-2 by FDA under an Emergency Use Authorization (EUA). This EUA will remain  in effect (meaning this test can be u sed) for the duration of the COVID-19 declaration under Section 564(b)(1) of the Act, 21 U.S.C. section 360bbb-3(b)(1), unless the authorization is terminated or revoked sooner. Performed at [facility], [place] [address] [place], [place] [address]     Radiology Reports DG Chest 2 View  Result Date: 12/03/2019 CLINICAL DATA:  Cough, shortness of breath, COVID positive on 11/25/2019 EXAM: CHEST - 2 VIEW COMPARISON:  06/12/2019 and 03/03/2019 chest radiograph. 07/11/2018 CT chest. FINDINGS: Left chest pacing device with leads terminating over the right heart. Mild lung hypoinflation with bibasilar atelectasis. No pneumothorax or pleural effusion. Cardiomediastinal silhouette including aortic atherosclerotic calcifications are unchanged. Multilevel spondylosis.  Right upper quadrant surgical clips. IMPRESSION: No acute airspace disease.  Minimal bibasilar atelectasis. Electronically Signed   By: Primitivo Gauze M.D.   On: 12/03/2019 12:48   CUP PACEART REMOTE DEVICE CHECK  Result Date: 11/28/2019 Scheduled remote reviewed. Normal device function.  Next remote 91 days. Felisa Bonier, RN, MSN

## 2019-12-06 NOTE — Plan of Care (Signed)
  Problem: Clinical Measurements: Goal: Respiratory complications will improve Outcome: Progressing   

## 2019-12-06 NOTE — Progress Notes (Signed)
SATURATION QUALIFICATIONS: (This note is used to comply with regulatory documentation for home oxygen)  Patient Saturations on Room Air at Rest = 97%  Patient Saturations on Room Air while Ambulating = 90%  Patient Saturations on 0 Liters of oxygen while Ambulating = 90%

## 2019-12-07 ENCOUNTER — Other Ambulatory Visit: Payer: Self-pay | Admitting: Physician Assistant

## 2019-12-07 LAB — COMPREHENSIVE METABOLIC PANEL
ALT: 26 U/L (ref 0–44)
AST: 42 U/L — ABNORMAL HIGH (ref 15–41)
Albumin: 2.3 g/dL — ABNORMAL LOW (ref 3.5–5.0)
Alkaline Phosphatase: 90 U/L (ref 38–126)
Anion gap: 11 (ref 5–15)
BUN: 33 mg/dL — ABNORMAL HIGH (ref 8–23)
CO2: 23 mmol/L (ref 22–32)
Calcium: 8.3 mg/dL — ABNORMAL LOW (ref 8.9–10.3)
Chloride: 103 mmol/L (ref 98–111)
Creatinine, Ser: 0.92 mg/dL (ref 0.44–1.00)
GFR calc Af Amer: >60 mL/min (ref >60)
GFR calc non Af Amer: >60 mL/min (ref >60)
Glucose, Bld: 279 mg/dL — ABNORMAL HIGH (ref 70–99)
Potassium: 4.7 mmol/L (ref 3.5–5.1)
Sodium: 137 mmol/L (ref 135–145)
Total Bilirubin: 0.8 mg/dL (ref 0.3–1.2)
Total Protein: 5.6 g/dL — ABNORMAL LOW (ref 6.5–8.1)

## 2019-12-07 LAB — CBC WITH DIFFERENTIAL/PLATELET
Abs Immature Granulocytes: 0.06 10*3/uL (ref 0.00–0.07)
Basophils Absolute: 0 10*3/uL (ref 0.0–0.1)
Basophils Relative: 0 %
Eosinophils Absolute: 0 10*3/uL (ref 0.0–0.5)
Eosinophils Relative: 0 %
HCT: 41.6 % (ref 36.0–46.0)
Hemoglobin: 13.4 g/dL (ref 12.0–15.0)
Immature Granulocytes: 1 %
Lymphocytes Relative: 9 %
Lymphs Abs: 1.1 10*3/uL (ref 0.7–4.0)
MCH: 30.7 pg (ref 26.0–34.0)
MCHC: 32.2 g/dL (ref 30.0–36.0)
MCV: 95.2 fL (ref 80.0–100.0)
Monocytes Absolute: 0.7 10*3/uL (ref 0.1–1.0)
Monocytes Relative: 6 %
Neutro Abs: 10.4 10*3/uL — ABNORMAL HIGH (ref 1.7–7.7)
Neutrophils Relative %: 84 %
Platelets: 196 10*3/uL (ref 150–400)
RBC: 4.37 MIL/uL (ref 3.87–5.11)
RDW: 14.3 % (ref 11.5–15.5)
WBC: 12.2 10*3/uL — ABNORMAL HIGH (ref 4.0–10.5)
nRBC: 0 % (ref 0.0–0.2)

## 2019-12-07 LAB — D-DIMER, QUANTITATIVE: D-Dimer, Quant: 2.3 ug/mL-FEU — ABNORMAL HIGH (ref 0.00–0.50)

## 2019-12-07 LAB — GLUCOSE, CAPILLARY
Glucose-Capillary: 274 mg/dL — ABNORMAL HIGH (ref 70–99)
Glucose-Capillary: 306 mg/dL — ABNORMAL HIGH (ref 70–99)
Glucose-Capillary: 316 mg/dL — ABNORMAL HIGH (ref 70–99)
Glucose-Capillary: 261 mg/dL — ABNORMAL HIGH (ref 70–99)

## 2019-12-07 LAB — FERRITIN: Ferritin: 1392 ng/mL — ABNORMAL HIGH (ref 11–307)

## 2019-12-07 LAB — C-REACTIVE PROTEIN: CRP: 1.8 mg/dL — ABNORMAL HIGH (ref <1.0)

## 2019-12-07 MED ORDER — INSULIN GLARGINE 100 UNIT/ML ~~LOC~~ SOLN
15.0000 [IU] | Freq: Every day | SUBCUTANEOUS | Status: DC
  Administered 2019-12-08: 15 [IU] via SUBCUTANEOUS
  Filled 2019-12-07: qty 0.15

## 2019-12-07 MED ORDER — ONDANSETRON HCL 4 MG/2ML IJ SOLN
4.0000 mg | Freq: Four times a day (QID) | INTRAMUSCULAR | Status: DC | PRN
  Administered 2019-12-07: 4 mg via INTRAVENOUS
  Filled 2019-12-07: qty 2

## 2019-12-07 MED ORDER — ALBUTEROL SULFATE HFA 108 (90 BASE) MCG/ACT IN AERS: 2.0000 | INHALATION_SPRAY | RESPIRATORY_TRACT | 0 refills | Status: DC | PRN

## 2019-12-07 MED ORDER — BENZONATATE 100 MG PO CAPS
200.0000 mg | ORAL_CAPSULE | Freq: Three times a day (TID) | ORAL | Status: DC
  Administered 2019-12-07 – 2019-12-10 (×10): 200 mg via ORAL
  Filled 2019-12-07 (×11): qty 2

## 2019-12-07 MED ORDER — BENZONATATE 200 MG PO CAPS: 200.0000 mg | ORAL_CAPSULE | Freq: Three times a day (TID) | ORAL | 0 refills | Status: DC

## 2019-12-07 MED ORDER — HYDROCOD POLST-CPM POLST ER 10-8 MG/5ML PO SUER: 5.0000 mL | Freq: Two times a day (BID) | ORAL | 0 refills | Status: DC | PRN

## 2019-12-07 NOTE — Plan of Care (Signed)
  Problem: Clinical Measurements: Goal: Respiratory complications will improve Outcome: Progressing   

## 2019-12-07 NOTE — Care Management (Signed)
Pt deemed stable for discharge home today. Pt confirms she has a PCP and denied barriers with paying for discharge medications.  Discharge order written - no outstanding TOC needs found.  CM signing off

## 2019-12-07 NOTE — Progress Notes (Signed)
Physical Therapy Treatment Patient Details Name: [name] MRN: [id number] DOB: 1945/09/12 Today's Date: 12/07/2019    History of Present Illness 74 year old female admitted 12/03/19 with SOB, cough, malaise, diarrhea, known +COVID 11/24/19. O2 saturations as low as 87% on room air. Patient with acute hypoxic respiratory failure secondary to COVID being treated wtih IV Remdesivir and steroids. CXR: minimal bibasilar atelectasis. PMH: paroxysmal atrial fibrillation on chronic anticoagulation, hypothyroidism, DM type II, rheumatoid arthritis, GERD    PT Comments    Patient presents with fatigue, mild unsteadiness, and decreased activity tolerance. Patient reports she feels "generally rotten" and worse than yesterday. Noted patient coughing throughout session, less energy noted compared to PT evaluation 2 days ago. With patient's permission, secure chat to MD re: patient feeling worse today. Anticipate as patient improves medically, she will return to her baseline mobility. Patient has her family at home to assist her as needed. They have all recovered from [place] per patient report on initial evaluation 2 days ago.    Follow Up Recommendations  No PT follow up;Supervision/Assistance - 24 hour     Equipment Recommendations  None recommended by PT       Precautions / Restrictions Precautions Precautions: Fall Precaution Comments: +COVID Restrictions Weight Bearing Restrictions: No    Mobility  Bed Mobility   General bed mobility comments: Patient sitting up in recliner chair upon PT entrance.  Transfers Overall transfer level: Needs assistance Equipment used: None Transfers: Sit to/from Stand Sit to Stand: Supervision  Ambulation/Gait Ambulation/Gait assistance: Supervision Gait Distance (Feet): 20 Feet Assistive device: None Gait Pattern/deviations: Step-through pattern;Decreased step length - right;Decreased step length - left Gait velocity: decreased   General Gait  Details: Patient agreeable to ambulate in room only due to not feeling well, nauseous. She intermittently used sink etc in room for until UE support. O2 sat 88-92% on room air.    Balance Overall balance assessment: Mild deficits observed, not formally tested     Cognition Arousal/Alertness: Awake/alert Behavior During Therapy: WFL for tasks assessed/performed Overall Cognitive Status: Within Functional Limits for tasks assessed        General Comments General comments (skin integrity, edema, etc.): Patient on room air. She reports feeling worse today compared to yesterday with sore throat, nauseous, coughing more. MD informed via secure chat (with patient's permission).      Pertinent Vitals/Pain Pain Assessment: No/denies pain           PT Goals (current goals can now be found in the care plan section) Progress towards PT goals: Progressing toward goals    Frequency    Min 3X/week      PT Plan Current plan remains appropriate       AM-PAC PT "6 Clicks" Mobility   Outcome Measure  Help needed turning from your back to your side while in a flat bed without using bedrails?: None Help needed moving from lying on your back to sitting on the side of a flat bed without using bedrails?: None Help needed moving to and from a bed to a chair (including a wheelchair)?: None Help needed standing up from a chair using your arms (e.g., wheelchair or bedside chair)?: None Help needed to walk in hospital room?: A Little Help needed climbing 3-5 steps with a railing? : A Little 6 Click Score: 22    End of Session   Activity Tolerance: Patient limited by fatigue Patient left: in chair;with call bell/phone within reach Nurse Communication: Other (comment)(patient requestion for nausea med)  PT Visit Diagnosis: Unsteadiness on feet (R26.81);Other abnormalities of gait and mobility (R26.89)     Time: [id number] PT Time Calculation (min) (ACUTE ONLY): 14 min  Charges:   $Therapeutic Activity: 8-22 mins                    Birdie Hopes, PT, DPT Acute Rehab [phone] office     Birdie Hopes 12/07/2019, 10:59 AM

## 2019-12-07 NOTE — Progress Notes (Signed)
Inpatient Diabetes Program Recommendations  AACE/ADA: New Consensus Statement on Inpatient Glycemic Control (2015)  Target Ranges:  Prepandial:   less than 140 mg/dL      Peak postprandial:   less than 180 mg/dL (1-2 hours)      Critically ill patients:  140 - 180 mg/dL   Lab Results  Component Value Date   GLUCAP 274 (H) 12/07/2019   HGBA1C 8.3 (H) 11/05/2019    Review of Glycemic Control Results for ASATA, MIGLIORE (MRN [id number]) as of 12/07/2019 09:13  Ref. Range 12/06/2019 07:35 12/06/2019 12:13 12/06/2019 16:52 12/06/2019 21:19 12/07/2019 07:26  Glucose-Capillary Latest Ref Range: 70 - 99 mg/dL 285 (H) 345 (H) 244 (H) 247 (H) 274 (H)    Diabetes history: DM 2 Outpatient Diabetes medications: Metformin 500 bid Current orders for Inpatient glycemic control:  Novolog 0-15 units tid + hs Lantus 10 Daily  Decadron 6 mg Q24 hours BUN/Creat: WNL  Inpatient Diabetes Program Recommendations:    Fasting glucose 10 points lower than yesterday on Lantus 10 units.  Consider increasing Lantus to 10 units bid (first dose already received this am).   Thanks, Tama Headings RN, MSN, BC-ADM Inpatient Diabetes Coordinator Team Pager [phone] (8a-5p)

## 2019-12-07 NOTE — Progress Notes (Signed)
PROGRESS NOTE                                                                                                                                                                                                             Patient Demographics:    [name], is a 74 y.o. female, DOB - 1946/07/17, [id number]  Outpatient Primary MD for the patient is Buelah Manis, Modena Nunnery, MD   Admit date - 12/03/2019   LOS - 4  Chief Complaint  Patient presents with  . Covid Exposure  . Cough  . Shortness of Breath       Brief Narrative: Patient is a 74 y.o. female with PMHx of PAF, rheumatoid arthritis, hypothyroidism, DM-2 who was diagnosed with COVID-19 on 4/3-presented with worsening cough, shortness of breath-further evaluation revealed acute hypoxic respiratory failure secondary to COVID-19 pneumonia.  See below for further details.  Significant Events: 4/3>> diagnosed with COVID-19 4/12>> admit to [facility] for hypoxia from COVID-19  COVID-19 medications: Steroids: 4/12>> Remdesivir:4/12>>  Antibiotics: None  Microbiology data: None  DVT prophylaxis: Eliquis  Procedures: None  Consults: None    Subjective:   Nauseous after I saw her-feels weak when walking with therapy.  Coughing continues.   Assessment  & Plan :   Acute Hypoxic Resp Failure due to Covid 19 Viral pneumonia: Slowly improving-titrated down to room air-we will complete remdesivir today.  Continue steroids and supportive care.  Mobilize and see how she does today.  Plans were for discharge today but she feels nauseous, weak-continues to cough-hence will keep hospitalized for additional day.  Fever: afebrile  O2 requirements:  SpO2: 92 % O2 Flow Rate (L/min): 3 L/min FiO2 (%): (!) 2 %   COVID-19 Labs: Recent Labs    12/05/19 0256 12/06/19 0319 12/06/19 0516 12/07/19 0238  DDIMER 2.99*  --  2.76* 2.30*  FERRITIN 1,242* 1,205*  --  1,392*  CRP  8.1* 3.2*  --  1.8*       Component Value Date/Time   BNP 142.5 (H) 12/03/2019 1804    Recent Labs  Lab 12/03/19 1804  PROCALCITON <0.10    Lab Results  Component Value Date   SARSCOV2NAA POSITIVE (A) 12/03/2019   SARSCOV2NAA NOT DETECTED 06/08/2019   [place] NEGATIVE 03/03/2019     Prone/Incentive Spirometry: encouraged  incentive spirometry use 3-4/hour  AKI: Likely  hemodynamically mediated-resolved with supportive care.  HTN: Controlled-continue Coreg, Cardizem, Lasix and losartan  DM-2 with uncontrolled hyperglycemia secondary to steroids (A1c 8.3 on 11/05/2019): CBGs remain on the higher side-increase Lantus to 15 units-continue SSI.    CBG (last 3)  Recent Labs    12/06/19 2119 12/07/19 0726 12/07/19 1201  GLUCAP 247* 274* 306*   Hypothyroidism: Continue with Synthroid  GERD: Continue with PPI  Rheumatoid arthritis: Hold Arava-patient on steroids.  She is also on Enbrel at home  PAF: Sinus rhythm on telemetry-continue Eliquis.  On Cardizem and Coreg.  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   Weight as of this encounter: 82.1 kg.   ABG:    Component Value Date/Time   PHART 7.359 10/15/2013 0505   PCO2ART 38.4 10/15/2013 0505   PO2ART 64.0 (L) 10/15/2013 0505   HCO3 27.6 03/03/2019 1138   TCO2 29 03/03/2019 1138   ACIDBASEDEF 3.0 (H) 10/15/2013 0505   O2SAT 100.0 03/03/2019 1138    Vent Settings: N/A   Condition - Stable  Family Communication  : Called her daughter-unable to get in touch with her-unable to leave a voicemail on 4/16  Code Status :  Full Code  Diet :  Diet Order            Diet - low sodium heart healthy        Diet Carb Modified        Diet heart healthy/carb modified Room service appropriate? Yes; Fluid consistency: Thin  Diet effective now                Disposition Plan  : Home hopefully in the next day or so if she remains stable.  Barriers to discharge: Hypoxia requiring O2 supplementation/complete 5 days of IV Remdesivir  Antimicorbials  :    Anti-infectives (From admission, onward)   Start     Dose/Rate Route Frequency Ordered Stop   12/04/19 1000  remdesivir 100 mg in sodium chloride 0.9 % 100 mL IVPB     100 mg 200 mL/hr over 30 Minutes Intravenous Daily 12/03/19 1652 12/07/19 0908   12/03/19 1800  remdesivir 200 mg in sodium chloride 0.9% 250 mL IVPB     200 mg 580 mL/hr over 30 Minutes Intravenous Once 12/03/19 1652 12/03/19 1950      Inpatient Medications  Scheduled Meds: . apixaban  5 mg Oral BID  . vitamin C  500 mg Oral Daily  . atorvastatin  10 mg Oral Daily  . benzonatate  200 mg Oral TID  . carvedilol  12.5 mg Oral BID WC  . dexamethasone  6 mg Oral Q24H  . diltiazem  360 mg Oral Daily  . furosemide  40 mg Oral QODAY  . insulin aspart  0-15 Units Subcutaneous TID WC  . insulin aspart  0-5 Units Subcutaneous QHS  . insulin glargine  10 Units Subcutaneous Daily  . levothyroxine  50 mcg Oral QAC breakfast  . losartan  50 mg Oral Daily  . pantoprazole  40 mg Oral Daily  . potassium chloride  10 mEq Oral Daily  . sodium chloride flush  3 mL Intravenous Once  . sodium chloride flush  3 mL Intravenous Q12H  . zinc sulfate  220 mg Oral Daily   Continuous Infusions:  PRN Meds:.acetaminophen, chlorpheniramine-HYDROcodone, diclofenac Sodium, guaiFENesin-dextromethorphan, methocarbamol, ondansetron (ZOFRAN) IV, phenol  Time Spent in minutes  25  See all Orders from today for further details   Oren Binet M.D on 12/07/2019 at 4:05 PM  To page go to [email or web address] - use universal password  [facility] -  Office  [phone]    Objective:   Vitals:   12/07/19 0043 12/07/19 0609 12/07/19 0730 12/07/19 1600  BP:  (!) 147/62 (!) 159/73 117/68  Pulse: 60 74 67 60  Resp: 10 15 13 12   Temp:   97.6 F (36.4 C) 98.5 F (36.9 C) (!) 97.3 F (36.3 C)  TempSrc:  Oral Oral Oral  SpO2: 92% 94% 90% 92%  Weight:      Height:        Wt Readings from Last 3 Encounters:  12/04/19 82.1 kg  11/13/19 85 kg  11/05/19 84.8 kg     Intake/Output Summary (Last 24 hours) at 12/07/2019 1605 Last data filed at 12/07/2019 1515 Gross per 24 hour  Intake 783 ml  Output --  Net 783 ml     Physical Exam Gen Exam:Alert awake-not in any distress HEENT:atraumatic, normocephalic Chest: B/L clear to auscultation anteriorly CVS:S1S2 regular Abdomen:soft non tender, non distended Extremities:no edema Neurology: Non focal Skin: no rash   Data Review:    CBC Recent Labs  Lab 12/03/19 1213 12/04/19 0406 12/05/19 0256 12/06/19 0319 12/07/19 0238  WBC 5.9 4.3 7.4 11.0* 12.2*  HGB 13.2 14.1 14.8 13.9 13.4  HCT 42.2 46.0 46.9* 43.5 41.6  PLT 131* 147* 178 199 196  MCV 100.5* 99.1 97.1 96.9 95.2  MCH 31.4 30.4 30.6 31.0 30.7  MCHC 31.3 30.7 31.6 32.0 32.2  RDW 15.0 14.7 14.5 14.5 14.3  LYMPHSABS  --  0.8 1.4 0.4* 1.1  MONOABS  --  0.1 0.5 0.7 0.7  EOSABS  --  0.0 0.0 0.0 0.0  BASOSABS  --  0.0 0.0 0.0 0.0    Chemistries  Recent Labs  Lab 12/03/19 1213 12/04/19 0406 12/05/19 0256 12/06/19 0319 12/07/19 0238  NA 141 140 138 136 137  K 3.7 4.3 4.3 4.7 4.7  CL 102 102 101 101 103  CO2 29 27 23 23 23   GLUCOSE 154* 191* 208* 287* 279*  BUN 10 10 20  31* 33*  CREATININE 1.12* 0.88 0.88 1.07* 0.92  CALCIUM 8.6* 8.4* 8.8* 8.5* 8.3*  MG  --  1.7 1.7 2.2  --   AST 43* 42* 43* 41 42*  ALT 23 24 22 20 26   ALKPHOS 93 116 106 92 90  BILITOT 0.6 0.6 0.8 1.1 0.8   ------------------------------------------------------------------------------------------------------------------ No results for input(s): CHOL, HDL, LDLCALC, TRIG, CHOLHDL, LDLDIRECT in the last 72 hours.  Lab Results  Component Value Date   HGBA1C 8.3 (H) 11/05/2019    ------------------------------------------------------------------------------------------------------------------ No results for input(s): TSH, T4TOTAL, T3FREE, THYROIDAB in the last 72 hours.  Invalid input(s): FREET3 ------------------------------------------------------------------------------------------------------------------ Recent Labs    12/06/19 0319 12/07/19 0238  FERRITIN 1,205* 1,392*    Coagulation profile No results for input(s): INR, PROTIME in the last 168 hours.  Recent Labs    12/06/19 0516 12/07/19 0238  DDIMER 2.76* 2.30*    Cardiac Enzymes No results for input(s): CKMB, TROPONINI, MYOGLOBIN in the last 168 hours.  Invalid input(s): CK ------------------------------------------------------------------------------------------------------------------    Component Value Date/Time   BNP 142.5 (H) 12/03/2019 1804    Micro Results Recent Results (from the past 240 hour(s))  SARS CORONAVIRUS 2 (TAT 6-24 HRS) Nasopharyngeal Nasopharyngeal Swab     Status: Abnormal   Collection  Time: 12/03/19  4:30 PM   Specimen: Nasopharyngeal Swab  Result Value Ref Range Status   SARS Coronavirus 2 POSITIVE (A) NEGATIVE Final    Comment: RESULT CALLED TO, READ BACK BY AND VERIFIED WITH: KOTT G, RN AT 2236 ON 12/03/2019 BY SAINVILUS S (NOTE) SARS-CoV-2 target nucleic acids are DETECTED. The SARS-CoV-2 RNA is generally detectable in upper and lower respiratory specimens during the acute phase of infection. Positive results are indicative of the presence of SARS-CoV-2 RNA. Clinical correlation with patient history and other diagnostic information is  necessary to determine patient infection status. Positive results do not rule out bacterial infection or co-infection with other viruses.  The expected result is Negative. Fact Sheet for Patients: SugarRoll.be Fact Sheet for Healthcare  Providers: https://www.woods-mathews.com/ This test is not yet approved or cleared by the [place] FDA and  has been authorized for detection and/or diagnosis of SARS-CoV-2 by FDA under an Emergency Use Authorization (EUA). This EUA will remain  in effect (meaning this test can be u sed) for the duration of the COVID-19 declaration under Section 564(b)(1) of the Act, 21 U.S.C. section 360bbb-3(b)(1), unless the authorization is terminated or revoked sooner. Performed at [facility], [place] [address] [place], [place] [address]     Radiology Reports DG Chest 2 View  Result Date: 12/03/2019 CLINICAL DATA:  Cough, shortness of breath, COVID positive on 11/25/2019 EXAM: CHEST - 2 VIEW COMPARISON:  06/12/2019 and 03/03/2019 chest radiograph. 07/11/2018 CT chest. FINDINGS: Left chest pacing device with leads terminating over the right heart. Mild lung hypoinflation with bibasilar atelectasis. No pneumothorax or pleural effusion. Cardiomediastinal silhouette including aortic atherosclerotic calcifications are unchanged. Multilevel spondylosis.  Right upper quadrant surgical clips. IMPRESSION: No acute airspace disease.  Minimal bibasilar atelectasis. Electronically Signed   By: Primitivo Gauze M.D.   On: 12/03/2019 12:48   CUP PACEART REMOTE DEVICE CHECK  Result Date: 11/28/2019 Scheduled remote reviewed. Normal device function.  Next remote 91 days. Felisa Bonier, RN, MSN

## 2019-12-07 NOTE — Discharge Summary (Signed)
PATIENT DETAILS Name: [name] Age: 74 y.o. Sex: female Date of Birth: November 26, 1945 MRN: [id number]. Admitting Physician: Norval Morton, MD [id number], Modena Nunnery, MD  Admit Date: 12/03/2019 Discharge date: 12/07/2019  Recommendations for Outpatient Follow-up:  1. Follow up with PCP in 1-2 weeks 2. Please obtain CMP/CBC in one week 3. Repeat Chest Xray in 4-6 week  Admitted From:  Home  Disposition: [place]: No  Equipment/Devices: None  Discharge Condition: Stable  CODE STATUS: FULL CODE  Diet recommendation:  Diet Order            Diet - low sodium heart healthy        Diet Carb Modified        Diet heart healthy/carb modified Room service appropriate? Yes; Fluid consistency: Thin  Diet effective now               Brief Narrative: Patient is a 74 y.o. female with PMHx of PAF, rheumatoid arthritis, hypothyroidism, DM-2 who was diagnosed with COVID-19 on 4/3-presented with worsening cough, shortness of breath-further evaluation revealed acute hypoxic respiratory failure secondary to COVID-19 pneumonia.  See below for further details.  Significant Events: 4/3>> diagnosed with COVID-19 4/12>> admit to [facility] for hypoxia from COVID-19  COVID-19 medications: Steroids: 4/12>>4/16 Remdesivir:4/12>>4/16  Antibiotics: None  Microbiology data: None  Procedures: None  Consults: None  Brief Hospital Course: Acute Hypoxic Resp Failure due to Covid 19 Viral pneumonia:  Much improved-has been on room air since yesterday.  CRP continues to downtrend.  Will complete remdesivir on 4/16.  Since no longer hypoxic-CRP significantly better-suspect does not require steroids on discharge.  Ambulated by nursing staff yesterday-does not require home O2 on discharge.  COVID-19 Labs:  Recent Labs    12/05/19 0256 12/06/19 0319 12/06/19 0516 12/07/19 0238  DDIMER 2.99*  --  2.76* 2.30*  FERRITIN 1,242* 1,205*  --  1,392*  CRP 8.1* 3.2*  --   1.8*    Lab Results  Component Value Date   SARSCOV2NAA POSITIVE (A) 12/03/2019   SARSCOV2NAA NOT DETECTED 06/08/2019   [place] NEGATIVE 03/03/2019     AKI: Likely hemodynamically mediated-resolved with supportive care.  HTN: Controlled-continue Coreg, Cardizem, Lasix and losartan  DM-2 with uncontrolled hyperglycemia secondary to steroids (A1c 8.3 on 11/05/2019): CBGs on the higher side due to steroids-since steroids being discontinued on discharge-suspect CBGs will improve over the next few days.  Resume Metformin on discharge.  While in the hospital she was treated with insulin.  Hypothyroidism: Continue with Synthroid  GERD: Continue with PPI  Rheumatoid arthritis:  Arava was held-okay to resume on discharge.  She will also resume Enbrel.   PAF: Sinus rhythm on telemetry-continue Eliquis.  On Cardizem and Coreg.  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   Weight as of this encounter: 82.1 kg.    Discharge Diagnoses:  Principal Problem:   COVID-19 virus infection Active Problems:   Hyperlipidemia   Essential hypertension, benign   GERD (gastroesophageal reflux disease)   Hypomagnesemia   Hypothyroidism   Tachy-brady syndrome (HCC)   Obesity (BMI 30-39.9)   Status post placement of cardiac pacemaker   Diabetes mellitus type 2, uncontrolled ([place])   Immunocompromised state due to drug therapy   Acute respiratory failure with hypoxia [facility])   Discharge Instructions:    Person Under Monitoring Name: Glayds Rohweder  [place]  Location: [place] [address]   Infection Prevention Recommendations for Individuals Confirmed to have, or Being Evaluated for, 2019 Novel Coronavirus (COVID-19) Infection Who Receive Care at Home  Individuals who are confirmed to have, or are being evaluated for, COVID-19 should follow the  prevention steps below until a healthcare provider or local or state health department says they can return to normal activities.  Stay home except to get medical care You should restrict activities outside your home, except for getting medical care. Do not go to work, school, or public areas, and do not use public transportation or taxis.  Call ahead before visiting your doctor Before your medical appointment, call the healthcare provider and tell them that you have, or are being evaluated for, COVID-19 infection. This will help the healthcare provider's office take steps to keep other people from getting infected. Ask your healthcare provider to call the local or state health department.  Monitor your symptoms Seek prompt medical attention if your illness is worsening (e.g., difficulty breathing). Before going to your medical appointment, call the healthcare provider and tell them that you have, or are being evaluated for, COVID-19 infection. Ask your healthcare provider to call the local or state health department.  Wear a facemask You should wear a facemask that covers your nose and mouth when you are in the same room with other people and when you visit a healthcare provider. People who live with or visit you should also wear a facemask while they are in the same room with you.  Separate yourself from other people in your home As much as possible, you should stay in a different room from other people in your home. Also, you should use a separate bathroom, if available.  Avoid sharing household items You should not share dishes, drinking glasses, cups, eating utensils, towels, bedding, or other items with other people in your home. After using these items, you should wash them thoroughly with soap and water.  Cover your coughs and sneezes Cover your mouth and nose with a tissue when you cough or sneeze, or you can cough or sneeze into your sleeve. Throw used tissues in a lined  trash can, and immediately wash your hands with soap and water for at least 20 seconds or use an alcohol-based hand rub.  Wash your [facility] your hands often and thoroughly with soap and water for at least 20 seconds. You can use an alcohol-based hand sanitizer if soap and water are not available and if your hands are not visibly dirty. Avoid touching your eyes, nose, and mouth with unwashed hands.   Prevention Steps for Caregivers and Household Members of Individuals Confirmed to have, or Being Evaluated for, COVID-19 Infection Being Cared for in the Home  If you live with, or provide care at home for, a person confirmed to have, or being evaluated for, COVID-19 infection please follow these guidelines to prevent infection:  Follow healthcare provider's instructions Make sure that you understand and can help the patient follow any healthcare provider instructions for all care.  Provide for the patient's basic needs You should help the patient with basic needs in the home and provide support for getting groceries, prescriptions, and other personal needs.  Monitor the patient's symptoms If they are getting sicker, call his or her medical provider and tell them that the patient has, or is being evaluated for, COVID-19 infection. This will help the healthcare provider's office take steps to keep other people from  getting infected. Ask the healthcare provider to call the local or state health department.  Limit the number of people who have contact with the patient  If possible, have only one caregiver for the patient.  Other household members should stay in another home or place of residence. If this is not possible, they should stay  in another room, or be separated from the patient as much as possible. Use a separate bathroom, if available.  Restrict visitors who do not have an essential need to be in the home.  Keep older adults, very young children, and other sick people away  from the patient Keep older adults, very young children, and those who have compromised immune systems or chronic health conditions away from the patient. This includes people with chronic heart, lung, or kidney conditions, diabetes, and cancer.  Ensure good ventilation Make sure that shared spaces in the home have good air flow, such as from an air conditioner or an opened window, weather permitting.  Wash your hands often  Wash your hands often and thoroughly with soap and water for at least 20 seconds. You can use an alcohol based hand sanitizer if soap and water are not available and if your hands are not visibly dirty.  Avoid touching your eyes, nose, and mouth with unwashed hands.  Use disposable paper towels to dry your hands. If not available, use dedicated cloth towels and replace them when they become wet.  Wear a facemask and gloves  Wear a disposable facemask at all times in the room and gloves when you touch or have contact with the patient's blood, body fluids, and/or secretions or excretions, such as sweat, saliva, sputum, nasal mucus, vomit, urine, or feces.  Ensure the mask fits over your nose and mouth tightly, and do not touch it during use.  Throw out disposable facemasks and gloves after using them. Do not reuse.  Wash your hands immediately after removing your facemask and gloves.  If your personal clothing becomes contaminated, carefully remove clothing and launder. Wash your hands after handling contaminated clothing.  Place all used disposable facemasks, gloves, and other waste in a lined container before disposing them with other household waste.  Remove gloves and wash your hands immediately after handling these items.  Do not share dishes, glasses, or other household items with the patient  Avoid sharing household items. You should not share dishes, drinking glasses, cups, eating utensils, towels, bedding, or other items with a patient who is confirmed to  have, or being evaluated for, COVID-19 infection.  After the person uses these items, you should wash them thoroughly with soap and water.  Wash laundry thoroughly  Immediately remove and wash clothes or bedding that have blood, body fluids, and/or secretions or excretions, such as sweat, saliva, sputum, nasal mucus, vomit, urine, or feces, on them.  Wear gloves when handling laundry from the patient.  Read and follow directions on labels of laundry or clothing items and detergent. In general, wash and dry with the warmest temperatures recommended on the label.  Clean all areas the individual has used often  Clean all touchable surfaces, such as counters, tabletops, doorknobs, bathroom fixtures, toilets, phones, keyboards, tablets, and bedside tables, every day. Also, clean any surfaces that may have blood, body fluids, and/or secretions or excretions on them.  Wear gloves when cleaning surfaces the patient has come in contact with.  Use a diluted bleach solution (e.g., dilute bleach with 1 part bleach and 10 parts water) or a  household disinfectant with a label that says EPA-registered for coronaviruses. To make a bleach solution at home, add 1 tablespoon of bleach to 1 quart (4 cups) of water. For a larger supply, add  cup of bleach to 1 gallon (16 cups) of water.  Read labels of cleaning products and follow recommendations provided on product labels. Labels contain instructions for safe and effective use of the cleaning product including precautions you should take when applying the product, such as wearing gloves or eye protection and making sure you have good ventilation during use of the product.  Remove gloves and wash hands immediately after cleaning.  Monitor yourself for signs and symptoms of illness Caregivers and household members are considered close contacts, should monitor their health, and will be asked to limit movement outside of the home to the extent possible. Follow  the monitoring steps for close contacts listed on the symptom monitoring form.   ? If you have additional questions, contact your local health department or call the epidemiologist on call at [phone] (available 24/7). ? This guidance is subject to change. For the most up-to-date guidance from CDC, please refer to their website: YouBlogs.pl    Activity:  As tolerated with Full fall precautions use walker/cane & assistance as needed  Discharge Instructions    Call MD for:  difficulty breathing, headache or visual disturbances   Complete by: As directed    Call MD for:  extreme fatigue   Complete by: As directed    Call MD for:  persistant nausea and vomiting   Complete by: As directed    Diet - low sodium heart healthy   Complete by: As directed    Diet Carb Modified   Complete by: As directed    Discharge instructions   Complete by: As directed    1.)  3 weeks of isolation from 11/24/2019  2.)  Please ask your primary care practitioner to repeat a chest x-ray in 4 to 6 weeks.   Follow with Primary MD  Alycia Rossetti, MD in 1-2 weeks  Please get a complete blood count and chemistry panel checked by your Primary MD at your next visit, and again as instructed by your Primary MD.  Get Medicines reviewed and adjusted: Please take all your medications with you for your next visit with your Primary MD  Laboratory/radiological data: Please request your Primary MD to go over all hospital tests and procedure/radiological results at the follow up, please ask your Primary MD to get all Hospital records sent to his/her office.  In some cases, they will be blood work, cultures and biopsy results pending at the time of your discharge. Please request that your primary care M.D. follows up on these results.  Also Note the following: If you experience worsening of your admission symptoms, develop shortness of breath, life  threatening emergency, suicidal or homicidal thoughts you must seek medical attention immediately by calling 911 or calling your MD immediately  if symptoms less severe.  You must read complete instructions/literature along with all the possible adverse reactions/side effects for all the Medicines you take and that have been prescribed to you. Take any new Medicines after you have completely understood and accpet all the possible adverse reactions/side effects.   Do not drive when taking Pain medications or sleeping medications (Benzodaizepines)  Do not take more than prescribed Pain, Sleep and Anxiety Medications. It is not advisable to combine anxiety,sleep and pain medications without talking with your primary care practitioner  Special  Instructions: If you have smoked or chewed Tobacco  in the last 2 yrs please stop smoking, stop any regular Alcohol  and or any Recreational drug use.  Wear Seat belts while driving.  Please note: You were cared for by a hospitalist during your hospital stay. Once you are discharged, your primary care physician will handle any further medical issues. Please note that NO REFILLS for any discharge medications will be authorized once you are discharged, as it is imperative that you return to your primary care physician (or establish a relationship with a primary care physician if you do not have one) for your post hospital discharge needs so that they can reassess your need for medications and monitor your lab values.   Increase activity slowly   Complete by: As directed      Allergies as of 12/07/2019      Reactions   Monopril [fosinopril] Cough   Magnesium-containing Compounds Nausea Only, Other (See Comments)   Re: Mag-Ox at higher doses caused a lot of stomach upset (dose had to be decreased)   Norvasc [amlodipine Besylate] Other (See Comments)   10 mg dose caused LE edema- can tolerate 5 mg dose      Medication List    TAKE these medications     albuterol 108 (90 Base) MCG/ACT inhaler Commonly known as: VENTOLIN HFA Inhale 2 puffs into the lungs every 4 (four) hours as needed for wheezing or shortness of breath.   atorvastatin 10 MG tablet Commonly known as: LIPITOR TAKE 1 TABLET BY MOUTH EVERY DAY What changed:   how much to take  how to take this  when to take this  additional instructions   benzonatate 200 MG capsule Commonly known as: TESSALON Take 1 capsule (200 mg total) by mouth 3 (three) times daily.   CALCIUM 600 + D PO Take 1 tablet by mouth 2 (two) times daily.   carvedilol 12.5 MG tablet Commonly known as: COREG Take 1 tablet (12.5 mg total) by mouth 2 (two) times daily with a meal.   chlorpheniramine-HYDROcodone 10-8 MG/5ML Suer Commonly known as: TUSSIONEX Take 5 mLs by mouth every 12 (twelve) hours as needed for cough.   diltiazem 360 MG 24 hr capsule Commonly known as: CARDIZEM CD TAKE 1 CAPSULE BY MOUTH EVERY DAY What changed: how much to take   Eliquis 5 MG Tabs tablet Generic drug: apixaban TAKE 1 TABLET BY MOUTH TWICE A DAY What changed: how much to take   Enbrel SureClick 50 MG/ML injection Generic drug: etanercept INJECT 50 MG (0.98 ML) UNDER THE SKIN ONCE A WEEK What changed:   how much to take  how to take this  when to take this  additional instructions   estradiol 0.5 MG tablet Commonly known as: ESTRACE Take 0.5 mg by mouth 2 (two) times a day.   fish oil-omega-3 fatty acids 1000 MG capsule Take 1 g by mouth at bedtime.   folic acid 1 MG tablet Commonly known as: FOLVITE TAKE 1 TABLET BY MOUTH TWICE A DAY   furosemide 40 MG tablet Commonly known as: LASIX Take 1 tablet daily by mouth until swelling resolves then begin taking 1 tablet by mouth every other day. What changed:   how much to take  how to take this  when to take this  additional instructions   leflunomide 10 MG tablet Commonly known as: ARAVA TAKE 1 TABLET BY MOUTH EVERY DAY    levothyroxine 50 MCG tablet Commonly known as: SYNTHROID TAKE  1 TABLET BY MOUTH DAILY BEFORE BREAKFAST What changed: See the new instructions.   losartan 50 MG tablet Commonly known as: COZAAR Take 1 tablet (50 mg total) by mouth daily.   magnesium oxide 400 MG tablet Commonly known as: MAG-OX Take 1,200 mg by mouth 2 (two) times daily.   metFORMIN 500 MG tablet Commonly known as: GLUCOPHAGE Take 1 tablet (500 mg total) by mouth 2 (two) times daily with a meal.   methocarbamol 500 MG tablet Commonly known as: ROBAXIN Take 1 tablet (500 mg total) by mouth daily as needed for muscle spasms.   multivitamins ther. w/minerals Tabs tablet Take 1 tablet by mouth daily.   pantoprazole 40 MG tablet Commonly known as: PROTONIX TAKE 1 TABLET BY MOUTH EVERY DAY   potassium chloride 10 MEQ tablet Commonly known as: KLOR-CON Take 1 tablet (10 mEq total) by mouth daily.   vitamin B-12 500 MCG tablet Commonly known as: CYANOCOBALAMIN Take 500 mcg by mouth daily.   Voltaren 1 % Gel Generic drug: diclofenac sodium Apply 3 g topically daily as needed (to painful sites (in 3 large joints)).      Follow-up Information    [place], Modena Nunnery, MD. Schedule an appointment as soon as possible for a visit in 1 week(s).   Specialty: Family Medicine Contact information: [address] [place] [address] [phone]        Lorretta Harp, MD. Schedule an appointment as soon as possible for a visit in 1 month(s).   Specialties: Cardiology, Radiology Contact information: [address] [place] [address] [place] [address] [phone]          Allergies  Allergen Reactions  . Monopril [Fosinopril] Cough  . Magnesium-Containing Compounds Nausea Only and Other (See Comments)    Re: Mag-Ox at higher doses caused a lot of stomach upset (dose had to be decreased)  . Norvasc [Amlodipine Besylate] Other (See Comments)    10 mg dose caused LE edema- can tolerate 5 mg dose      Other Procedures/Studies: DG Chest 2 View  Result Date: 12/03/2019 CLINICAL DATA:  Cough, shortness of breath, COVID positive on 11/25/2019 EXAM: CHEST - 2 VIEW COMPARISON:  06/12/2019 and 03/03/2019 chest radiograph. 07/11/2018 CT chest. FINDINGS: Left chest pacing device with leads terminating over the right heart. Mild lung hypoinflation with bibasilar atelectasis. No pneumothorax or pleural effusion. Cardiomediastinal silhouette including aortic atherosclerotic calcifications are unchanged. Multilevel spondylosis.  Right upper quadrant surgical clips. IMPRESSION: No acute airspace disease.  Minimal bibasilar atelectasis. Electronically Signed   By: Primitivo Gauze M.D.   On: 12/03/2019 12:48   CUP PACEART REMOTE DEVICE CHECK  Result Date: 11/28/2019 Scheduled remote reviewed. Normal device function.  Next remote 91 days. Felisa Bonier, RN, MSN    TODAY-DAY OF DISCHARGE:  Subjective:   Morgane Maffett today has no headache,no chest abdominal pain,no new weakness tingling or numbness, feels much better wants to go home today.   Objective:   Blood pressure (!) 159/73, pulse 67, temperature 98.5 F (36.9 C), temperature source Oral, resp. rate 13, height 4\' 10"  (1.473 m), weight 82.1 kg, SpO2 90 %.  Intake/Output Summary (Last 24 hours) at 12/07/2019 1026 Last data filed at 12/07/2019 0850 Gross per 24 hour  Intake 763 ml  Output --  Net 763 ml   Filed Weights   12/03/19 1146 12/04/19 1238  Weight: 84.8 kg 82.1 kg    Exam: Awake Alert, Oriented *3, No new F.N deficits, Normal affect [place].AT,PERRAL  Supple Neck,No JVD, No cervical lymphadenopathy appriciated.  Symmetrical Chest wall movement, Good air movement bilaterally, CTAB RRR,No Gallops,Rubs or new Murmurs, No Parasternal Heave +ve B.Sounds, Abd Soft, Non tender, No organomegaly appriciated, No rebound -guarding or rigidity. No Cyanosis, Clubbing or edema, No new Rash or bruise   PERTINENT RADIOLOGIC STUDIES: DG Chest  2 View  Result Date: 12/03/2019 CLINICAL DATA:  Cough, shortness of breath, COVID positive on 11/25/2019 EXAM: CHEST - 2 VIEW COMPARISON:  06/12/2019 and 03/03/2019 chest radiograph. 07/11/2018 CT chest. FINDINGS: Left chest pacing device with leads terminating over the right heart. Mild lung hypoinflation with bibasilar atelectasis. No pneumothorax or pleural effusion. Cardiomediastinal silhouette including aortic atherosclerotic calcifications are unchanged. Multilevel spondylosis.  Right upper quadrant surgical clips. IMPRESSION: No acute airspace disease.  Minimal bibasilar atelectasis. Electronically Signed   By: Primitivo Gauze M.D.   On: 12/03/2019 12:48   CUP PACEART REMOTE DEVICE CHECK  Result Date: 11/28/2019 Scheduled remote reviewed. Normal device function.  Next remote 91 days. Felisa Bonier, RN, MSN    PERTINENT LAB RESULTS: CBC: Recent Labs    12/06/19 0319 12/07/19 0238  WBC 11.0* 12.2*  HGB 13.9 13.4  HCT 43.5 41.6  PLT 199 196   CMET CMP     Component Value Date/Time   NA 137 12/07/2019 0238   NA 141 10/17/2019 1318   K 4.7 12/07/2019 0238   CL 103 12/07/2019 0238   CO2 23 12/07/2019 0238   GLUCOSE 279 (H) 12/07/2019 0238   BUN 33 (H) 12/07/2019 0238   BUN 21 10/17/2019 1318   CREATININE 0.92 12/07/2019 0238   CREATININE 1.08 (H) 11/21/2019 0853   CALCIUM 8.3 (L) 12/07/2019 0238   PROT 5.6 (L) 12/07/2019 0238   ALBUMIN 2.3 (L) 12/07/2019 0238   AST 42 (H) 12/07/2019 0238   ALT 26 12/07/2019 0238   ALKPHOS 90 12/07/2019 0238   BILITOT 0.8 12/07/2019 0238   GFRNONAA >60 12/07/2019 0238   GFRNONAA 53 (L) 10/31/2019 1115   GFRAA >60 12/07/2019 0238   GFRAA 61 10/31/2019 1115    GFR Estimated Creatinine Clearance: 47.9 mL/min (by C-G formula based on SCr of 0.92 mg/dL). No results for input(s): LIPASE, AMYLASE in the last 72 hours. No results for input(s): CKTOTAL, CKMB, CKMBINDEX, TROPONINI in the last 72 hours. Invalid input(s): POCBNP Recent Labs     12/06/19 0516 12/07/19 0238  DDIMER 2.76* 2.30*   No results for input(s): HGBA1C in the last 72 hours. No results for input(s): CHOL, HDL, LDLCALC, TRIG, CHOLHDL, LDLDIRECT in the last 72 hours. No results for input(s): TSH, T4TOTAL, T3FREE, THYROIDAB in the last 72 hours.  Invalid input(s): FREET3 Recent Labs    12/06/19 0319 12/07/19 0238  FERRITIN 1,205* 1,392*   Coags: No results for input(s): INR in the last 72 hours.  Invalid input(s): PT Microbiology: Recent Results (from the past 240 hour(s))  SARS CORONAVIRUS 2 (TAT 6-24 HRS) Nasopharyngeal Nasopharyngeal Swab     Status: Abnormal   Collection Time: 12/03/19  4:30 PM   Specimen: Nasopharyngeal Swab  Result Value Ref Range Status   SARS Coronavirus 2 POSITIVE (A) NEGATIVE Final    Comment: RESULT CALLED TO, READ BACK BY AND VERIFIED WITH: KOTT G, RN AT 2236 ON 12/03/2019 BY SAINVILUS S (NOTE) SARS-CoV-2 target nucleic acids are DETECTED. The SARS-CoV-2 RNA is generally detectable in upper and lower respiratory specimens during the acute phase of infection. Positive results are indicative of the presence of SARS-CoV-2 RNA. Clinical correlation with  patient history and other diagnostic information is  necessary to determine patient infection status. Positive results do not rule out bacterial infection or co-infection with other viruses.  The expected result is Negative. Fact Sheet for Patients: SugarRoll.be Fact Sheet for Healthcare Providers: https://www.woods-mathews.com/ This test is not yet approved or cleared by the [place] FDA and  has been authorized for detection and/or diagnosis of SARS-CoV-2 by FDA under an Emergency Use Authorization (EUA). This EUA will remain  in effect (meaning this test can be u sed) for the duration of the COVID-19 declaration under Section 564(b)(1) of the Act, 21 U.S.C. section 360bbb-3(b)(1), unless the authorization is terminated  or revoked sooner. Performed at [facility], [place] [address], [place], [place] [address]     FURTHER DISCHARGE INSTRUCTIONS:  Get Medicines reviewed and adjusted: Please take all your medications with you for your next visit with your Primary MD  Laboratory/radiological data: Please request your Primary MD to go over all hospital tests and procedure/radiological results at the follow up, please ask your Primary MD to get all Hospital records sent to his/her office.  In some cases, they will be blood work, cultures and biopsy results pending at the time of your discharge. Please request that your primary care M.D. goes through all the records of your hospital data and follows up on these results.  Also Note the following: If you experience worsening of your admission symptoms, develop shortness of breath, life threatening emergency, suicidal or homicidal thoughts you must seek medical attention immediately by calling 911 or calling your MD immediately  if symptoms less severe.  You must read complete instructions/literature along with all the possible adverse reactions/side effects for all the Medicines you take and that have been prescribed to you. Take any new Medicines after you have completely understood and accpet all the possible adverse reactions/side effects.   Do not drive when taking Pain medications or sleeping medications (Benzodaizepines)  Do not take more than prescribed Pain, Sleep and Anxiety Medications. It is not advisable to combine anxiety,sleep and pain medications without talking with your primary care practitioner  Special Instructions: If you have smoked or chewed Tobacco  in the last 2 yrs please stop smoking, stop any regular Alcohol  and or any Recreational drug use.  Wear Seat belts while driving.  Please note: You were cared for by a hospitalist during your hospital stay. Once you are discharged, your primary care physician will handle any further  medical issues. Please note that NO REFILLS for any discharge medications will be authorized once you are discharged, as it is imperative that you return to your primary care physician (or establish a relationship with a primary care physician if you do not have one) for your post hospital discharge needs so that they can reassess your need for medications and monitor your lab values.  Total Time spent coordinating discharge including counseling, education and face to face time equals 35 minutes. SignedOren Binet 12/07/2019 10:26 AM

## 2019-12-07 NOTE — Progress Notes (Signed)
Patient slept for most of the night.  She called 3-4 times for assistance to the bathroom; she is continent of urine.  Patient calls for assistance appropriately.  Patient complained of a headache and sore throat ~2200 that resolved with PRNs.  Patient has had a strong, non-productive cough.  VSS overnight.  Room air, saturating ~91%.  No overnight events.

## 2019-12-07 NOTE — Progress Notes (Signed)
Plans were to discharge her today-but after my evaluation earlier this morning-she is now nauseous, feels significantly worse than how she was earlier-also has persistent coughing spells.  Hold discharge-we will reevaluate tomorrow.

## 2019-12-08 LAB — CBC WITH DIFFERENTIAL/PLATELET
Abs Immature Granulocytes: 0.07 10*3/uL (ref 0.00–0.07)
Basophils Absolute: 0 10*3/uL (ref 0.0–0.1)
Basophils Relative: 0 %
Eosinophils Absolute: 0.1 10*3/uL (ref 0.0–0.5)
Eosinophils Relative: 1 %
HCT: 43.7 % (ref 36.0–46.0)
Hemoglobin: 14.5 g/dL (ref 12.0–15.0)
Immature Granulocytes: 1 %
Lymphocytes Relative: 8 %
Lymphs Abs: 0.9 10*3/uL (ref 0.7–4.0)
MCH: 31.5 pg (ref 26.0–34.0)
MCHC: 33.2 g/dL (ref 30.0–36.0)
MCV: 95 fL (ref 80.0–100.0)
Monocytes Absolute: 0.6 10*3/uL (ref 0.1–1.0)
Monocytes Relative: 5 %
Neutro Abs: 9.9 10*3/uL — ABNORMAL HIGH (ref 1.7–7.7)
Neutrophils Relative %: 85 %
Platelets: 233 10*3/uL (ref 150–400)
RBC: 4.6 MIL/uL (ref 3.87–5.11)
RDW: 14.4 % (ref 11.5–15.5)
WBC: 11.7 10*3/uL — ABNORMAL HIGH (ref 4.0–10.5)
nRBC: 0 % (ref 0.0–0.2)

## 2019-12-08 LAB — COMPREHENSIVE METABOLIC PANEL
ALT: 38 U/L (ref 0–44)
AST: 47 U/L — ABNORMAL HIGH (ref 15–41)
Albumin: 2.5 g/dL — ABNORMAL LOW (ref 3.5–5.0)
Alkaline Phosphatase: 101 U/L (ref 38–126)
Anion gap: 11 (ref 5–15)
BUN: 28 mg/dL — ABNORMAL HIGH (ref 8–23)
CO2: 27 mmol/L (ref 22–32)
Calcium: 8.5 mg/dL — ABNORMAL LOW (ref 8.9–10.3)
Chloride: 99 mmol/L (ref 98–111)
Creatinine, Ser: 0.9 mg/dL (ref 0.44–1.00)
GFR calc Af Amer: >60 mL/min (ref >60)
GFR calc non Af Amer: >60 mL/min (ref >60)
Glucose, Bld: 252 mg/dL — ABNORMAL HIGH (ref 70–99)
Potassium: 4.4 mmol/L (ref 3.5–5.1)
Sodium: 137 mmol/L (ref 135–145)
Total Bilirubin: 0.7 mg/dL (ref 0.3–1.2)
Total Protein: 6 g/dL — ABNORMAL LOW (ref 6.5–8.1)

## 2019-12-08 LAB — C-REACTIVE PROTEIN: CRP: 1.2 mg/dL — ABNORMAL HIGH (ref <1.0)

## 2019-12-08 LAB — GLUCOSE, CAPILLARY
Glucose-Capillary: 255 mg/dL — ABNORMAL HIGH (ref 70–99)
Glucose-Capillary: 331 mg/dL — ABNORMAL HIGH (ref 70–99)
Glucose-Capillary: 384 mg/dL — ABNORMAL HIGH (ref 70–99)
Glucose-Capillary: 321 mg/dL — ABNORMAL HIGH (ref 70–99)

## 2019-12-08 LAB — BRAIN NATRIURETIC PEPTIDE: B Natriuretic Peptide: 147.5 pg/mL — ABNORMAL HIGH (ref 0.0–100.0)

## 2019-12-08 LAB — FERRITIN: Ferritin: 1370 ng/mL — ABNORMAL HIGH (ref 11–307)

## 2019-12-08 LAB — D-DIMER, QUANTITATIVE: D-Dimer, Quant: 3.11 ug/mL-FEU — ABNORMAL HIGH (ref 0.00–0.50)

## 2019-12-08 MED ORDER — ALBUTEROL SULFATE HFA 108 (90 BASE) MCG/ACT IN AERS
2.0000 | INHALATION_SPRAY | RESPIRATORY_TRACT | Status: DC | PRN
  Administered 2019-12-08 – 2019-12-09 (×2): 2 via RESPIRATORY_TRACT
  Filled 2019-12-08: qty 6.7

## 2019-12-08 MED ORDER — LORATADINE 10 MG PO TABS
10.0000 mg | ORAL_TABLET | Freq: Every day | ORAL | Status: DC
  Administered 2019-12-08 – 2019-12-10 (×3): 10 mg via ORAL
  Filled 2019-12-08 (×3): qty 1

## 2019-12-08 MED ORDER — INSULIN GLARGINE 100 UNIT/ML ~~LOC~~ SOLN
20.0000 [IU] | Freq: Every day | SUBCUTANEOUS | Status: DC
  Administered 2019-12-09: 08:00:00 20 [IU] via SUBCUTANEOUS
  Filled 2019-12-08 (×2): qty 0.2

## 2019-12-08 MED ORDER — DEXAMETHASONE SODIUM PHOSPHATE 10 MG/ML IJ SOLN
6.0000 mg | INTRAMUSCULAR | Status: DC
  Administered 2019-12-08 – 2019-12-10 (×3): 6 mg via INTRAVENOUS
  Filled 2019-12-08 (×3): qty 1

## 2019-12-08 MED ORDER — FLUTICASONE PROPIONATE 50 MCG/ACT NA SUSP
2.0000 | Freq: Every day | NASAL | Status: DC
  Administered 2019-12-08 – 2019-12-10 (×3): 2 via NASAL
  Filled 2019-12-08: qty 16

## 2019-12-08 MED ORDER — INSULIN ASPART 100 UNIT/ML ~~LOC~~ SOLN
4.0000 [IU] | Freq: Three times a day (TID) | SUBCUTANEOUS | Status: DC
  Administered 2019-12-08 – 2019-12-09 (×4): 4 [IU] via SUBCUTANEOUS

## 2019-12-08 MED ORDER — OXYMETAZOLINE HCL 0.05 % NA SOLN
1.0000 | Freq: Two times a day (BID) | NASAL | Status: DC
  Administered 2019-12-08 – 2019-12-10 (×5): 1 via NASAL
  Filled 2019-12-08: qty 30

## 2019-12-08 NOTE — Progress Notes (Signed)
PROGRESS NOTE                                                                                                                                                                                                             Patient Demographics:    [name], is a 74 y.o. female, DOB - [birth year], SD:[id number]  Outpatient Primary MD for the patient is Buelah Manis, Modena Nunnery, MD   Admit date - 12/03/2019   LOS - 5  Chief Complaint  Patient presents with  . Covid Exposure  . Cough  . Shortness of Breath       Brief Narrative: Patient is a 74 y.o. female with PMHx of PAF, rheumatoid arthritis, hypothyroidism, DM-2 who was diagnosed with COVID-19 on 4/3-presented with worsening cough, shortness of breath-further evaluation revealed acute hypoxic respiratory failure secondary to COVID-19 pneumonia.  See below for further details.  Significant Events: 4/3>> diagnosed with COVID-19 4/12>> admit to [facility] for hypoxia from COVID-19  COVID-19 medications: Steroids: 4/12>> Remdesivir:4/12>>4/16  Antibiotics: None  Microbiology data: None  DVT prophylaxis: Eliquis  Procedures: None  Consults: None    Subjective:   Feels weak-continues to cough-some exertional dyspnea continues.  Remains on 1-2 L of oxygen at rest today-yesterday was on room air.   Assessment  & Plan :   Acute Hypoxic Resp Failure due to Covid 19 Viral pneumonia: Although slowly improved compared to how she first came in-continues to intermittently require 1-2 L of oxygen at rest, claims that she still feels a little bit more short of breath than usual with ambulation.  Continues to have persistent coughing spells.  Continue with intravenous steroids-has finished a course of remdesivir.  Continue at attempts to mobilize-encourage incentive spirometry-and antitussive medications.  Once his symptoms are little bit better controlled-we can then consider  discharge-currently she is not yet stable or back to her baseline to consider discharge at this point.  Repeat chest x-ray, BNP tomorrow morning with a.m. labs.  Fever: afebrile  O2 requirements:  SpO2: 92 % O2 Flow Rate (L/min): 1 L/min FiO2 (%): (!) 2 %   COVID-19 Labs: Recent Labs    12/06/19 0319 12/06/19 0516 12/07/19 0238 12/08/19 0708  DDIMER  --  2.76* 2.30* 3.11*  FERRITIN 1,205*  --  1,392* 1,370*  CRP 3.2*  --  1.8* 1.2*  Component Value Date/Time   BNP 142.5 (H) 12/03/2019 1804    Recent Labs  Lab 12/03/19 1804  PROCALCITON <0.10    Lab Results  Component Value Date   SARSCOV2NAA POSITIVE (A) 12/03/2019   SARSCOV2NAA NOT DETECTED 06/08/2019   [place] NEGATIVE 03/03/2019     Prone/Incentive Spirometry: encouraged  incentive spirometry use 3-4/hour  AKI: Likely hemodynamically mediated-resolved with supportive care.  HTN: Controlled-continue Coreg, Cardizem, Lasix and losartan  DM-2 with uncontrolled hyperglycemia secondary to steroids (A1c 8.3 on 11/05/2019): CBGs remain on the higher side-increase Lantus to 20 units daily, add 4 units of NovoLog with meals-continue SSI.  CBG (last 3)  Recent Labs    12/07/19 2119 12/08/19 0738 12/08/19 1220  GLUCAP 261* 255* 331*   Hypothyroidism: Continue with Synthroid  GERD: Continue with PPI  Rheumatoid arthritis: Hold Arava-patient on steroids.  She is also on Enbrel at home  PAF: Sinus rhythm on telemetry-continue Eliquis.  On Cardizem and Coreg.  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   Weight as of this encounter: 82.1 kg.   ABG:    Component Value Date/Time   PHART 7.359 10/15/2013 0505   PCO2ART 38.4 10/15/2013 0505   PO2ART 64.0 (L) 10/15/2013 0505   HCO3 27.6 03/03/2019 1138   TCO2 29 03/03/2019 1138   ACIDBASEDEF 3.0  (H) 10/15/2013 0505   O2SAT 100.0 03/03/2019 1138    Vent Settings: N/A   Condition - Stable  Family Communication  : Unable to get in touch with daughter on 4/17-spoke with grandson on 4/17  Code Status :  Full Code  Diet :  Diet Order            Diet - low sodium heart healthy        Diet Carb Modified        Diet heart healthy/carb modified Room service appropriate? Yes; Fluid consistency: Thin  Diet effective now               Disposition Plan  : Home hopefully in the next day or so if she remains stable.  Barriers to discharge: Hypoxia requiring O2 supplementation/on IV Steroids  Antimicorbials  :    Anti-infectives (From admission, onward)   Start     Dose/Rate Route Frequency Ordered Stop   12/04/19 1000  remdesivir 100 mg in sodium chloride 0.9 % 100 mL IVPB     100 mg 200 mL/hr over 30 Minutes Intravenous Daily 12/03/19 1652 12/07/19 1653   12/03/19 1800  remdesivir 200 mg in sodium chloride 0.9% 250 mL IVPB     200 mg 580 mL/hr over 30 Minutes Intravenous Once 12/03/19 1652 12/03/19 1950      Inpatient Medications  Scheduled Meds: . apixaban  5 mg Oral BID  . vitamin C  500 mg Oral Daily  . atorvastatin  10 mg Oral Daily  . benzonatate  200 mg Oral TID  . carvedilol  12.5 mg Oral BID WC  . dexamethasone (DECADRON) injection  6 mg Intravenous Q24H  . diltiazem  360 mg Oral Daily  . fluticasone  2 spray Each Nare Daily  . furosemide  40 mg Oral QODAY  . insulin aspart  0-15 Units Subcutaneous TID WC  . insulin aspart  0-5 Units Subcutaneous QHS  . insulin aspart  4 Units Subcutaneous TID WC  . [START ON  12/09/2019] insulin glargine  20 Units Subcutaneous Daily  . levothyroxine  50 mcg Oral QAC breakfast  . loratadine  10 mg Oral Daily  . losartan  50 mg Oral Daily  . oxymetazoline  1 spray Each Nare BID  . pantoprazole  40 mg Oral Daily  . potassium chloride  10 mEq Oral Daily  . sodium chloride flush  3 mL Intravenous Once  . sodium chloride  flush  3 mL Intravenous Q12H  . zinc sulfate  220 mg Oral Daily   Continuous Infusions:  PRN Meds:.acetaminophen, albuterol, chlorpheniramine-HYDROcodone, diclofenac Sodium, guaiFENesin-dextromethorphan, methocarbamol, ondansetron (ZOFRAN) IV, phenol   Time Spent in minutes  25  See all Orders from today for further details   Oren Binet M.D on 12/08/2019 at 1:54 PM  To page go to [email or web address] - use universal password  [facility] -  Office  [phone]    Objective:   Vitals:   12/07/19 1955 12/08/19 0410 12/08/19 0515 12/08/19 0919  BP: 127/69 139/79    Pulse: 60 60  83  Resp: 12 10  19   Temp: 97.8 F (36.6 C) (!) 97.5 F (36.4 C)    TempSrc: Oral Oral    SpO2: 90% (!) 88% 90% 92%  Weight:      Height:        Wt Readings from Last 3 Encounters:  12/04/19 82.1 kg  11/13/19 85 kg  11/05/19 84.8 kg     Intake/Output Summary (Last 24 hours) at 12/08/2019 1354 Last data filed at 12/08/2019 0900 Gross per 24 hour  Intake 750 ml  Output --  Net 750 ml     Physical Exam Gen Exam:Alert awake-not in any distress HEENT:atraumatic, normocephalic Chest: B/L clear to auscultation anteriorly CVS:S1S2 regular Abdomen:soft non tender, non distended Extremities:no edema Neurology: Non focal Skin: no rash   Data Review:    CBC Recent Labs  Lab 12/04/19 0406 12/05/19 0256 12/06/19 0319 12/07/19 0238 12/08/19 0708  WBC 4.3 7.4 11.0* 12.2* 11.7*  HGB 14.1 14.8 13.9 13.4 14.5  HCT 46.0 46.9* 43.5 41.6 43.7  PLT 147* 178 199 196 233  MCV 99.1 97.1 96.9 95.2 95.0  MCH 30.4 30.6 31.0 30.7 31.5  MCHC 30.7 31.6 32.0 32.2 33.2  RDW 14.7 14.5 14.5 14.3 14.4  LYMPHSABS 0.8 1.4 0.4* 1.1 0.9  MONOABS 0.1 0.5 0.7 0.7 0.6  EOSABS 0.0 0.0 0.0 0.0 0.1  BASOSABS 0.0 0.0 0.0 0.0 0.0    Chemistries  Recent Labs  Lab 12/04/19 0406 12/05/19 0256 12/06/19 0319 12/07/19 0238 12/08/19 0708  NA 140 138 136 137 137  K 4.3 4.3 4.7 4.7 4.4  CL 102 101 101  103 99  CO2 27 23 23 23 27   GLUCOSE 191* 208* 287* 279* 252*  BUN 10 20 31* 33* 28*  CREATININE 0.88 0.88 1.07* 0.92 0.90  CALCIUM 8.4* 8.8* 8.5* 8.3* 8.5*  MG 1.7 1.7 2.2  --   --   AST 42* 43* 41 42* 47*  ALT 24 22 20 26  38  ALKPHOS 116 106 92 90 101  BILITOT 0.6 0.8 1.1 0.8 0.7   ------------------------------------------------------------------------------------------------------------------ No results for input(s): CHOL, HDL, LDLCALC, TRIG, CHOLHDL, LDLDIRECT in the last 72 hours.  Lab Results  Component Value Date   HGBA1C 8.3 (H) 11/05/2019   ------------------------------------------------------------------------------------------------------------------ No results for input(s): TSH, T4TOTAL, T3FREE, THYROIDAB in the last 72 hours.  Invalid input(s): FREET3 ------------------------------------------------------------------------------------------------------------------ Recent Labs    12/07/19 0238 12/08/19 0708  FERRITIN 1,392* 1,370*  Coagulation profile No results for input(s): INR, PROTIME in the last 168 hours.  Recent Labs    12/07/19 0238 12/08/19 0708  DDIMER 2.30* 3.11*    Cardiac Enzymes No results for input(s): CKMB, TROPONINI, MYOGLOBIN in the last 168 hours.  Invalid input(s): CK ------------------------------------------------------------------------------------------------------------------    Component Value Date/Time   BNP 142.5 (H) 12/03/2019 1804    Micro Results Recent Results (from the past 240 hour(s))  SARS CORONAVIRUS 2 (TAT 6-24 HRS) Nasopharyngeal Nasopharyngeal Swab     Status: Abnormal   Collection Time: 12/03/19  4:30 PM   Specimen: Nasopharyngeal Swab  Result Value Ref Range Status   SARS Coronavirus 2 POSITIVE (A) NEGATIVE Final    Comment: RESULT CALLED TO, READ BACK BY AND VERIFIED WITH: KOTT G, RN AT 2236 ON 12/03/2019 BY SAINVILUS S (NOTE) SARS-CoV-2 target nucleic acids are DETECTED. The SARS-CoV-2 RNA is  generally detectable in upper and lower respiratory specimens during the acute phase of infection. Positive results are indicative of the presence of SARS-CoV-2 RNA. Clinical correlation with patient history and other diagnostic information is  necessary to determine patient infection status. Positive results do not rule out bacterial infection or co-infection with other viruses.  The expected result is Negative. Fact Sheet for Patients: SugarRoll.be Fact Sheet for Healthcare Providers: https://www.woods-mathews.com/ This test is not yet approved or cleared by the [place] FDA and  has been authorized for detection and/or diagnosis of SARS-CoV-2 by FDA under an Emergency Use Authorization (EUA). This EUA will remain  in effect (meaning this test can be u sed) for the duration of the COVID-19 declaration under Section 564(b)(1) of the Act, 21 U.S.C. section 360bbb-3(b)(1), unless the authorization is terminated or revoked sooner. Performed at [facility], [place] [address], [place], [place] [address]     Radiology Reports DG Chest 2 View  Result Date: 12/03/2019 CLINICAL DATA:  Cough, shortness of breath, COVID positive on 11/25/2019 EXAM: CHEST - 2 VIEW COMPARISON:  06/12/2019 and 03/03/2019 chest radiograph. 07/11/2018 CT chest. FINDINGS: Left chest pacing device with leads terminating over the right heart. Mild lung hypoinflation with bibasilar atelectasis. No pneumothorax or pleural effusion. Cardiomediastinal silhouette including aortic atherosclerotic calcifications are unchanged. Multilevel spondylosis.  Right upper quadrant surgical clips. IMPRESSION: No acute airspace disease.  Minimal bibasilar atelectasis. Electronically Signed   By: Primitivo Gauze M.D.   On: 12/03/2019 12:48   CUP PACEART REMOTE DEVICE CHECK  Result Date: 11/28/2019 Scheduled remote reviewed. Normal device function.  Next remote 91 days. Felisa Bonier, RN,  MSN

## 2019-12-08 NOTE — Progress Notes (Signed)
No acute events overnight.  Pts cough has given her a sore throat.  Pt denied having further nausea.  Pt was placed on 1L nasal cannula early in the morning for O2 saturations staying ~86%.  Pt was sleeping flat on her back.  When she is awake, she saturates well on room air.  VSS otherwise.

## 2019-12-09 ENCOUNTER — Inpatient Hospital Stay (HOSPITAL_COMMUNITY): Payer: PPO

## 2019-12-09 LAB — COMPREHENSIVE METABOLIC PANEL
ALT: 41 U/L (ref 0–44)
AST: 55 U/L — ABNORMAL HIGH (ref 15–41)
Albumin: 2.3 g/dL — ABNORMAL LOW (ref 3.5–5.0)
Alkaline Phosphatase: 100 U/L (ref 38–126)
Anion gap: 10 (ref 5–15)
BUN: 32 mg/dL — ABNORMAL HIGH (ref 8–23)
CO2: 25 mmol/L (ref 22–32)
Calcium: 8.5 mg/dL — ABNORMAL LOW (ref 8.9–10.3)
Chloride: 102 mmol/L (ref 98–111)
Creatinine, Ser: 0.99 mg/dL (ref 0.44–1.00)
GFR calc Af Amer: >60 mL/min (ref >60)
GFR calc non Af Amer: 56 mL/min — ABNORMAL LOW (ref >60)
Glucose, Bld: 215 mg/dL — ABNORMAL HIGH (ref 70–99)
Potassium: 4.2 mmol/L (ref 3.5–5.1)
Sodium: 137 mmol/L (ref 135–145)
Total Bilirubin: 0.7 mg/dL (ref 0.3–1.2)
Total Protein: 5.6 g/dL — ABNORMAL LOW (ref 6.5–8.1)

## 2019-12-09 LAB — CBC
HCT: 43.8 % (ref 36.0–46.0)
Hemoglobin: 14.4 g/dL (ref 12.0–15.0)
MCH: 31.4 pg (ref 26.0–34.0)
MCHC: 32.9 g/dL (ref 30.0–36.0)
MCV: 95.4 fL (ref 80.0–100.0)
Platelets: 234 10*3/uL (ref 150–400)
RBC: 4.59 MIL/uL (ref 3.87–5.11)
RDW: 14.4 % (ref 11.5–15.5)
WBC: 12.9 10*3/uL — ABNORMAL HIGH (ref 4.0–10.5)
nRBC: 0 % (ref 0.0–0.2)

## 2019-12-09 LAB — GLUCOSE, CAPILLARY
Glucose-Capillary: 210 mg/dL — ABNORMAL HIGH (ref 70–99)
Glucose-Capillary: 218 mg/dL — ABNORMAL HIGH (ref 70–99)
Glucose-Capillary: 275 mg/dL — ABNORMAL HIGH (ref 70–99)
Glucose-Capillary: 328 mg/dL — ABNORMAL HIGH (ref 70–99)

## 2019-12-09 NOTE — Progress Notes (Signed)
PROGRESS NOTE                                                                                                                                                                                                             Patient Demographics:    [name], is a 74 y.o. female, DOB - 1946-06-29, SD:[id number]  Outpatient Primary MD for the patient is Buelah Manis, Modena Nunnery, MD   Admit date - 12/03/2019   LOS - 6  Chief Complaint  Patient presents with  . Covid Exposure  . Cough  . Shortness of Breath       Brief Narrative: Patient is a 74 y.o. female with PMHx of PAF, rheumatoid arthritis, hypothyroidism, DM-2 who was diagnosed with COVID-19 on 4/3-presented with worsening cough, shortness of breath-further evaluation revealed acute hypoxic respiratory failure secondary to COVID-19 pneumonia.  See below for further details.  Significant Events: 4/3>> diagnosed with COVID-19 4/12>> admit to [facility] for hypoxia from COVID-19  COVID-19 medications: Steroids: 4/12>> Remdesivir:4/12>>4/16  Antibiotics: None  Microbiology data: None  DVT prophylaxis: Eliquis  Procedures: None  Consults: None    Subjective:   Continues to have coughing spells-she is still somewhat short of breath mostly with ambulation-requiring around 2 L of oxygen.   Assessment  & Plan :   Acute Hypoxic Resp Failure due to Covid 19 Viral pneumonia: Although better than how she first presented to the hospital with-she is still having coughing spells and some exertional dyspnea-she is not yet back to baseline.  She is still requiring 2 L of oxygen at rest.  Continue supportive care-continue IV steroids, antitussives-continue to mobilize and attempt to titrate off oxygen.  Chest x-ray this morning-personally reviewed-unchanged compared to admission.    I suspect she may require home O2-we will need to assess oxygen requirement for home prior to  discharge.  .  Fever: afebrile  O2 requirements:  SpO2: (!) 85 % O2 Flow Rate (L/min): 2 L/min FiO2 (%): (!) 2 %   COVID-19 Labs: Recent Labs    12/07/19 0238 12/08/19 0708  DDIMER 2.30* 3.11*  FERRITIN 1,392* 1,370*  CRP 1.8* 1.2*       Component Value Date/Time   BNP 147.5 (H) 12/08/2019 0708    Recent Labs  Lab 12/03/19 1804  PROCALCITON <0.10    Lab Results  Component Value Date  SARSCOV2NAA POSITIVE (A) 12/03/2019   SARSCOV2NAA NOT DETECTED 06/08/2019   [place] NEGATIVE 03/03/2019     Prone/Incentive Spirometry: encouraged  incentive spirometry use 3-4/hour  AKI: Likely hemodynamically mediated-resolved with supportive care.  HTN: Controlled-continue Coreg, Cardizem, Lasix and losartan  DM-2 with uncontrolled hyperglycemia secondary to steroids (A1c 8.3 on 11/05/2019): CBGs remain on the higher side- will get first dose of 20 units of Lantus daily-continue NovoLog 4 units with meals and SSI.   CBG (last 3)  Recent Labs    12/08/19 1649 12/08/19 2044 12/09/19 0751  GLUCAP 384* 321* 210*   Hypothyroidism: Continue with Synthroid  GERD: Continue with PPI  Rheumatoid arthritis: Hold Arava-patient on steroids.  She is also on Enbrel at home  PAF: Sinus rhythm on telemetry-continue Eliquis.  On Cardizem and Coreg.  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   Weight as of this encounter: 82.1 kg.   ABG:    Component Value Date/Time   PHART 7.359 10/15/2013 0505   PCO2ART 38.4 10/15/2013 0505   PO2ART 64.0 (L) 10/15/2013 0505   HCO3 27.6 03/03/2019 1138   TCO2 29 03/03/2019 1138   ACIDBASEDEF 3.0 (H) 10/15/2013 0505   O2SAT 100.0 03/03/2019 1138    Vent Settings: N/A   Condition - Stable  Family Communication  : Unable to get in touch with daughter on 4/18  Code Status :   Full Code  Diet :  Diet Order            Diet - low sodium heart healthy        Diet Carb Modified        Diet heart healthy/carb modified Room service appropriate? Yes; Fluid consistency: Thin  Diet effective now               Disposition Plan  : Slowly improving-probably home on 4/19  Barriers to discharge: Hypoxia requiring O2 supplementation/on IV Steroids  Antimicorbials  :    Anti-infectives (From admission, onward)   Start     Dose/Rate Route Frequency Ordered Stop   12/04/19 1000  remdesivir 100 mg in sodium chloride 0.9 % 100 mL IVPB     100 mg 200 mL/hr over 30 Minutes Intravenous Daily 12/03/19 1652 12/07/19 1653   12/03/19 1800  remdesivir 200 mg in sodium chloride 0.9% 250 mL IVPB     200 mg 580 mL/hr over 30 Minutes Intravenous Once 12/03/19 1652 12/03/19 1950      Inpatient Medications  Scheduled Meds: . apixaban  5 mg Oral BID  . vitamin C  500 mg Oral Daily  . atorvastatin  10 mg Oral Daily  . benzonatate  200 mg Oral TID  . carvedilol  12.5 mg Oral BID WC  . dexamethasone (DECADRON) injection  6 mg Intravenous Q24H  . diltiazem  360 mg Oral Daily  . fluticasone  2 spray Each Nare Daily  . furosemide  40 mg Oral QODAY  . insulin aspart  0-15 Units Subcutaneous TID WC  . insulin aspart  0-5 Units Subcutaneous QHS  . insulin aspart  4 Units Subcutaneous TID WC  . insulin glargine  20 Units Subcutaneous Daily  . levothyroxine  50 mcg Oral QAC breakfast  . loratadine  10 mg Oral Daily  . losartan  50 mg Oral Daily  . oxymetazoline  1 spray Each Nare BID  .  pantoprazole  40 mg Oral Daily  . potassium chloride  10 mEq Oral Daily  . sodium chloride flush  3 mL Intravenous Once  . sodium chloride flush  3 mL Intravenous Q12H  . zinc sulfate  220 mg Oral Daily   Continuous Infusions:  PRN Meds:.acetaminophen, albuterol, chlorpheniramine-HYDROcodone, diclofenac Sodium, guaiFENesin-dextromethorphan, methocarbamol, ondansetron (ZOFRAN) IV, phenol    Time Spent in minutes  25  See all Orders from today for further details   Oren Binet M.D on 12/09/2019 at 11:44 AM  To page go to [email or web address] - use universal password  [facility] -  Office  [phone]    Objective:   Vitals:   12/08/19 1933 12/09/19 0355 12/09/19 0600 12/09/19 0753  BP: 127/65 140/60  (!) 142/97  Pulse: 65 67 64 65  Resp: 14 12 12 14   Temp: 97.6 F (36.4 C) (!) 97.5 F (36.4 C)  97.6 F (36.4 C)  TempSrc: Oral Oral  Oral  SpO2: 90% (!) 88% 91% (!) 85%  Weight:      Height:        Wt Readings from Last 3 Encounters:  12/04/19 82.1 kg  11/13/19 85 kg  11/05/19 84.8 kg     Intake/Output Summary (Last 24 hours) at 12/09/2019 1144 Last data filed at 12/09/2019 0700 Gross per 24 hour  Intake 360 ml  Output 2 ml  Net 358 ml     Physical Exam Gen Exam:Alert awake-not in any distress HEENT:atraumatic, normocephalic Chest: B/L clear to auscultation anteriorly CVS:S1S2 regular Abdomen:soft non tender, non distended Extremities:no edema Neurology: Non focal Skin: no rash   Data Review:    CBC Recent Labs  Lab 12/04/19 0406 12/04/19 0406 12/05/19 0256 12/06/19 0319 12/07/19 0238 12/08/19 0708 12/09/19 0414  WBC 4.3   < > 7.4 11.0* 12.2* 11.7* 12.9*  HGB 14.1   < > 14.8 13.9 13.4 14.5 14.4  HCT 46.0   < > 46.9* 43.5 41.6 43.7 43.8  PLT 147*   < > 178 199 196 233 234  MCV 99.1   < > 97.1 96.9 95.2 95.0 95.4  MCH 30.4   < > 30.6 31.0 30.7 31.5 31.4  MCHC 30.7   < > 31.6 32.0 32.2 33.2 32.9  RDW 14.7   < > 14.5 14.5 14.3 14.4 14.4  LYMPHSABS 0.8  --  1.4 0.4* 1.1 0.9  --   MONOABS 0.1  --  0.5 0.7 0.7 0.6  --   EOSABS 0.0  --  0.0 0.0 0.0 0.1  --   BASOSABS 0.0  --  0.0 0.0 0.0 0.0  --    < > = values in this interval not displayed.    Chemistries  Recent Labs  Lab 12/04/19 0406 12/04/19 0406 12/05/19 0256 12/06/19 0319 12/07/19 0238 12/08/19 0708 12/09/19 0414  NA 140   < > 138 136 137 137 137  K 4.3   < >  4.3 4.7 4.7 4.4 4.2  CL 102   < > 101 101 103 99 102  CO2 27   < > 23 23 23 27 25   GLUCOSE 191*   < > 208* 287* 279* 252* 215*  BUN 10   < > 20 31* 33* 28* 32*  CREATININE 0.88   < > 0.88 1.07* 0.92 0.90 0.99  CALCIUM 8.4*   < > 8.8* 8.5* 8.3* 8.5* 8.5*  MG 1.7  --  1.7 2.2  --   --   --   AST 42*   < >  43* 41 42* 47* 55*  ALT 24   < > 22 20 26  38 41  ALKPHOS 116   < > 106 92 90 101 100  BILITOT 0.6   < > 0.8 1.1 0.8 0.7 0.7   < > = values in this interval not displayed.   ------------------------------------------------------------------------------------------------------------------ No results for input(s): CHOL, HDL, LDLCALC, TRIG, CHOLHDL, LDLDIRECT in the last 72 hours.  Lab Results  Component Value Date   HGBA1C 8.3 (H) 11/05/2019   ------------------------------------------------------------------------------------------------------------------ No results for input(s): TSH, T4TOTAL, T3FREE, THYROIDAB in the last 72 hours.  Invalid input(s): FREET3 ------------------------------------------------------------------------------------------------------------------ Recent Labs    12/07/19 0238 12/08/19 0708  FERRITIN 1,392* 1,370*    Coagulation profile No results for input(s): INR, PROTIME in the last 168 hours.  Recent Labs    12/07/19 0238 12/08/19 0708  DDIMER 2.30* 3.11*    Cardiac Enzymes No results for input(s): CKMB, TROPONINI, MYOGLOBIN in the last 168 hours.  Invalid input(s): CK ------------------------------------------------------------------------------------------------------------------    Component Value Date/Time   BNP 147.5 (H) 12/08/2019 0708    Micro Results Recent Results (from the past 240 hour(s))  SARS CORONAVIRUS 2 (TAT 6-24 HRS) Nasopharyngeal Nasopharyngeal Swab     Status: Abnormal   Collection Time: 12/03/19  4:30 PM   Specimen: Nasopharyngeal Swab  Result Value Ref Range Status   SARS Coronavirus 2 POSITIVE (A) NEGATIVE  Final    Comment: RESULT CALLED TO, READ BACK BY AND VERIFIED WITH: KOTT G, RN AT 2236 ON 12/03/2019 BY SAINVILUS S (NOTE) SARS-CoV-2 target nucleic acids are DETECTED. The SARS-CoV-2 RNA is generally detectable in upper and lower respiratory specimens during the acute phase of infection. Positive results are indicative of the presence of SARS-CoV-2 RNA. Clinical correlation with patient history and other diagnostic information is  necessary to determine patient infection status. Positive results do not rule out bacterial infection or co-infection with other viruses.  The expected result is Negative. Fact Sheet for Patients: SugarRoll.be Fact Sheet for Healthcare Providers: https://www.woods-mathews.com/ This test is not yet approved or cleared by the [place] FDA and  has been authorized for detection and/or diagnosis of SARS-CoV-2 by FDA under an Emergency Use Authorization (EUA). This EUA will remain  in effect (meaning this test can be u sed) for the duration of the COVID-19 declaration under Section 564(b)(1) of the Act, 21 U.S.C. section 360bbb-3(b)(1), unless the authorization is terminated or revoked sooner. Performed at [facility], [place] [address], [place], [place] [address]     Radiology Reports DG Chest 2 View  Result Date: 12/03/2019 CLINICAL DATA:  Cough, shortness of breath, COVID positive on 11/25/2019 EXAM: CHEST - 2 VIEW COMPARISON:  06/12/2019 and 03/03/2019 chest radiograph. 07/11/2018 CT chest. FINDINGS: Left chest pacing device with leads terminating over the right heart. Mild lung hypoinflation with bibasilar atelectasis. No pneumothorax or pleural effusion. Cardiomediastinal silhouette including aortic atherosclerotic calcifications are unchanged. Multilevel spondylosis.  Right upper quadrant surgical clips. IMPRESSION: No acute airspace disease.  Minimal bibasilar atelectasis. Electronically Signed   By:  Primitivo Gauze M.D.   On: 12/03/2019 12:48   DG Chest Port 1 View  Result Date: 12/09/2019 CLINICAL DATA:  Cough, dyspnea, COVID-19 positive EXAM: PORTABLE CHEST 1 VIEW COMPARISON:  12/03/2019 chest radiograph. 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 Stable cardiomediastinal silhouette with top-normal heart size. No pneumothorax. No pleural effusion. No pulmonary edema. Minimal bibasilar atelectasis, unchanged. IMPRESSION: Minimal bibasilar atelectasis, unchanged. Electronically Signed   By: Ilona Sorrel  M.D.   On: 12/09/2019 10:38   CUP PACEART REMOTE DEVICE CHECK  Result Date: 11/28/2019 Scheduled remote reviewed. Normal device function.  Next remote 91 days. Felisa Bonier, RN, MSN

## 2019-12-10 LAB — GLUCOSE, CAPILLARY
Glucose-Capillary: 217 mg/dL — ABNORMAL HIGH (ref 70–99)
Glucose-Capillary: 295 mg/dL — ABNORMAL HIGH (ref 70–99)

## 2019-12-10 MED ORDER — BENZONATATE 200 MG PO CAPS: 200.0000 mg | ORAL_CAPSULE | Freq: Three times a day (TID) | ORAL | 0 refills | Status: DC

## 2019-12-10 MED ORDER — HYDROCOD POLST-CPM POLST ER 10-8 MG/5ML PO SUER: 5.0000 mL | Freq: Two times a day (BID) | ORAL | 0 refills | Status: DC | PRN

## 2019-12-10 MED ORDER — INSULIN ASPART 100 UNIT/ML ~~LOC~~ SOLN
6.0000 [IU] | Freq: Three times a day (TID) | SUBCUTANEOUS | Status: DC
  Administered 2019-12-10 (×2): 6 [IU] via SUBCUTANEOUS

## 2019-12-10 MED ORDER — INSULIN GLARGINE 100 UNIT/ML ~~LOC~~ SOLN
25.0000 [IU] | Freq: Every day | SUBCUTANEOUS | Status: DC
  Administered 2019-12-10: 25 [IU] via SUBCUTANEOUS
  Filled 2019-12-10: qty 0.25

## 2019-12-10 MED ORDER — ALBUTEROL SULFATE HFA 108 (90 BASE) MCG/ACT IN AERS: 2.0000 | INHALATION_SPRAY | RESPIRATORY_TRACT | 0 refills | Status: AC | PRN

## 2019-12-10 NOTE — Progress Notes (Signed)
SATURATION QUALIFICATIONS: (This note is used to comply with regulatory documentation for home oxygen)  Patient Saturations on Room Air at Rest = 89%  Patient Saturations on Room Air while Ambulating = 83%  Patient Saturations on 3 Liters of oxygen while Ambulating = 90%  Please briefly explain why patient needs home oxygen: Pt requiring O2. On 2L [place] pt. O2 saturations at 88-89%. On 3L [place] saturations 90-92%.

## 2019-12-10 NOTE — Progress Notes (Signed)
[name] to be D/C'd Home per MD order.  Discussed with the patient and all questions fully answered.  VSS, Skin clean, dry and intact without evidence of skin break down, no evidence of skin tears noted. IV catheter discontinued intact. Site without signs and symptoms of complications. Dressing and pressure applied.  An After Visit Summary was printed and given to the patient. Patient received prescription.  D/c education completed with patient including follow up instructions, medication list, d/c activities limitations if indicated, with other d/c instructions as indicated by MD - patient able to verbalize understanding, all questions fully answered.   Patient instructed to return to ED, call 911, or call MD for any changes in condition.   Patient escorted via [place], and D/C home via private auto.  Jeanella Craze 12/10/2019 3:37 PM

## 2019-12-10 NOTE — Discharge Summary (Signed)
PATIENT DETAILS Name: [name] Age: 74 y.o. Sex: female Date of Birth: Nov 27, 1945 MRN: [id number]. Admitting Physician: Norval Morton, MD [id number], Modena Nunnery, MD  Admit Date: 12/03/2019 Discharge date: 12/10/2019  Recommendations for Outpatient Follow-up:  1. Follow up with PCP in 1-2 weeks 2. Please obtain CMP/CBC in one week 3. Repeat Chest Xray in 4-6 week 4. Please reassess whether patient requires oxygen at next visit.  Admitted From:  Home  Disposition: Home with home health services   Home Health:  Yes  Equipment/Devices:  oxygen 3L  Discharge Condition: Stable  CODE STATUS: FULL CODE  Diet recommendation:  Diet Order            Diet - low sodium heart healthy        Diet Carb Modified        Diet heart healthy/carb modified Room service appropriate? Yes; Fluid consistency: Thin  Diet effective now               Brief Narrative: Patient is a 74 y.o. female with PMHx of PAF, rheumatoid arthritis, hypothyroidism, DM-2 who was diagnosed with COVID-19 on 4/3-presented with worsening cough, shortness of breath-further evaluation revealed acute hypoxic respiratory failure secondary to COVID-19 pneumonia.  See below for further details.  Significant Events: 4/3>> diagnosed with COVID-19 4/12>> admit to [facility] for hypoxia from COVID-19  COVID-19 medications: Steroids: 4/12>>4/19 Remdesivir:4/12>>4/16  Antibiotics: None  Microbiology data: None  Procedures: None  Consults: None   Brief Hospital Course: Acute Hypoxic Resp Failure due to Covid 19 Viral pneumonia:  Slowly improved-treated with steroids and remdesivir along with antitussives.  Although improved-still having coughing spells.  Has been  intermittently requiring some amount of oxygen during this hospital stay.  On room air-O2 saturation in the high 80s/low 90s but does require home O2 mostly with ambulation.  She feels better today-and thinks that she should be okay to  be discharged home.  Home O2 and home health services have been arranged.  Stable for discharge with close outpatient follow-up with PCP.  She has completed a course of remdesivir.  COVID-19 Labs:  Recent Labs    12/08/19 0708  DDIMER 3.11*  FERRITIN 1,370*  CRP 1.2*    Lab Results  Component Value Date   SARSCOV2NAA POSITIVE (A) 12/03/2019   SARSCOV2NAA NOT DETECTED 06/08/2019   [place] NEGATIVE 03/03/2019     AKI: Likely hemodynamically mediated-resolved with supportive care.  HTN: Controlled-continue Coreg, Cardizem, Lasix and losartan  DM-2 with uncontrolled hyperglycemia secondary to steroids (A1c 8.3 on 11/05/2019):  CBGs were on the higher side during this hospital stay due to steroids-since steroids are being discontinued-suspect she can be resumed on her usual home medications on discharge.  Hypothyroidism: Continue with Synthroid  GERD: Continue with PPI  Rheumatoid arthritis: Arava was held-suspect could be resumed on discharge. She is also on Enbrel at home  PAF: Sinus rhythm on telemetry-continue Eliquis.  On Cardizem and Coreg.  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   Weight as of this encounter: 82.1 kg.    Discharge Diagnoses:  Principal Problem:   COVID-19 virus infection Active Problems:   Hyperlipidemia   Essential hypertension, benign   GERD (gastroesophageal reflux disease)   Hypomagnesemia   Hypothyroidism   Tachy-brady syndrome (HCC)   Obesity (BMI 30-39.9)   Status post placement of cardiac  pacemaker   Diabetes mellitus type 2, uncontrolled ([place])   Immunocompromised state due to drug therapy   Acute respiratory failure with hypoxia [facility])   Discharge Instructions:    Person Under Monitoring Name: [name]  Location: [facility]  [id number]   Infection Prevention Recommendations for Individuals Confirmed to have, or Being Evaluated for, 2019 Novel Coronavirus (COVID-19) Infection Who Receive Care at Home  Individuals who are confirmed to have, or are being evaluated for, COVID-19 should follow the prevention steps below until a healthcare provider or local or state health department says they can return to normal activities.  Stay home except to get medical care You should restrict activities outside your home, except for getting medical care. Do not go to work, school, or public areas, and do not use public transportation or taxis.  Call ahead before visiting your doctor Before your medical appointment, call the healthcare provider and tell them that you have, or are being evaluated for, COVID-19 infection. This will help the healthcare provider's office take steps to keep other people from getting infected. Ask your healthcare provider to call the local or state health department.  Monitor your symptoms Seek prompt medical attention if your illness is worsening (e.g., difficulty breathing). Before going to your medical appointment, call the healthcare provider and tell them that you have, or are being evaluated for, COVID-19 infection. Ask your healthcare provider to call the local or state health department.  Wear a facemask You should wear a facemask that covers your nose and mouth when you are in the same room with other people and when you visit a healthcare provider. People who live with or visit you should also wear a facemask while they are in the same room with you.  Separate yourself from other people in your home As much as possible, you should stay in a different room from other people in your home. Also, you should use a separate bathroom, if available.  Avoid sharing household items You should not share dishes, drinking glasses, cups, eating utensils, towels, bedding, or other items with other  people in your home. After using these items, you should wash them thoroughly with soap and water.  Cover your coughs and sneezes Cover your mouth and nose with a tissue when you cough or sneeze, or you can cough or sneeze into your sleeve. Throw used tissues in a lined trash can, and immediately wash your hands with soap and water for at least 20 seconds or use an alcohol-based hand rub.  Wash your [facility] your hands often and thoroughly with soap and water for at least 20 seconds. You can use an alcohol-based hand sanitizer if soap and water are not available and if your hands are not visibly dirty. Avoid touching your eyes, nose, and mouth with unwashed hands.   Prevention Steps for Caregivers and Household Members of Individuals Confirmed to have, or Being Evaluated for, COVID-19 Infection Being Cared for in the Home  If you live with, or provide care at home for, a person confirmed to have, or being evaluated for, COVID-19 infection please follow these guidelines to prevent infection:  Follow healthcare provider's instructions Make sure that you understand and can help the patient follow any healthcare provider instructions for all care.  Provide for the patient's basic needs You should help the patient with basic needs in the home and provide support for getting groceries, prescriptions, and other personal needs.  Monitor the patient's symptoms If  they are getting sicker, call his or her medical provider and tell them that the patient has, or is being evaluated for, COVID-19 infection. This will help the healthcare provider's office take steps to keep other people from getting infected. Ask the healthcare provider to call the local or state health department.  Limit the number of people who have contact with the patient  If possible, have only one caregiver for the patient.  Other household members should stay in another home or place of residence. If this is not  possible, they should stay  in another room, or be separated from the patient as much as possible. Use a separate bathroom, if available.  Restrict visitors who do not have an essential need to be in the home.  Keep older adults, very young children, and other sick people away from the patient Keep older adults, very young children, and those who have compromised immune systems or chronic health conditions away from the patient. This includes people with chronic heart, lung, or kidney conditions, diabetes, and cancer.  Ensure good ventilation Make sure that shared spaces in the home have good air flow, such as from an air conditioner or an opened window, weather permitting.  Wash your hands often  Wash your hands often and thoroughly with soap and water for at least 20 seconds. You can use an alcohol based hand sanitizer if soap and water are not available and if your hands are not visibly dirty.  Avoid touching your eyes, nose, and mouth with unwashed hands.  Use disposable paper towels to dry your hands. If not available, use dedicated cloth towels and replace them when they become wet.  Wear a facemask and gloves  Wear a disposable facemask at all times in the room and gloves when you touch or have contact with the patient's blood, body fluids, and/or secretions or excretions, such as sweat, saliva, sputum, nasal mucus, vomit, urine, or feces.  Ensure the mask fits over your nose and mouth tightly, and do not touch it during use.  Throw out disposable facemasks and gloves after using them. Do not reuse.  Wash your hands immediately after removing your facemask and gloves.  If your personal clothing becomes contaminated, carefully remove clothing and launder. Wash your hands after handling contaminated clothing.  Place all used disposable facemasks, gloves, and other waste in a lined container before disposing them with other household waste.  Remove gloves and wash your hands  immediately after handling these items.  Do not share dishes, glasses, or other household items with the patient  Avoid sharing household items. You should not share dishes, drinking glasses, cups, eating utensils, towels, bedding, or other items with a patient who is confirmed to have, or being evaluated for, COVID-19 infection.  After the person uses these items, you should wash them thoroughly with soap and water.  Wash laundry thoroughly  Immediately remove and wash clothes or bedding that have blood, body fluids, and/or secretions or excretions, such as sweat, saliva, sputum, nasal mucus, vomit, urine, or feces, on them.  Wear gloves when handling laundry from the patient.  Read and follow directions on labels of laundry or clothing items and detergent. In general, wash and dry with the warmest temperatures recommended on the label.  Clean all areas the individual has used often  Clean all touchable surfaces, such as counters, tabletops, doorknobs, bathroom fixtures, toilets, phones, keyboards, tablets, and bedside tables, every day. Also, clean any surfaces that may have blood, body fluids,  and/or secretions or excretions on them.  Wear gloves when cleaning surfaces the patient has come in contact with.  Use a diluted bleach solution (e.g., dilute bleach with 1 part bleach and 10 parts water) or a household disinfectant with a label that says EPA-registered for coronaviruses. To make a bleach solution at home, add 1 tablespoon of bleach to 1 quart (4 cups) of water. For a larger supply, add  cup of bleach to 1 gallon (16 cups) of water.  Read labels of cleaning products and follow recommendations provided on product labels. Labels contain instructions for safe and effective use of the cleaning product including precautions you should take when applying the product, such as wearing gloves or eye protection and making sure you have good ventilation during use of the product.  Remove  gloves and wash hands immediately after cleaning.  Monitor yourself for signs and symptoms of illness Caregivers and household members are considered close contacts, should monitor their health, and will be asked to limit movement outside of the home to the extent possible. Follow the monitoring steps for close contacts listed on the symptom monitoring form.   ? If you have additional questions, contact your local health department or call the epidemiologist on call at [phone] (available 24/7). ? This guidance is subject to change. For the most up-to-date guidance from CDC, please refer to their website: YouBlogs.pl    Activity:  As tolerated with Full fall precautions use walker/cane & assistance as needed  Discharge Instructions    Call MD for:  difficulty breathing, headache or visual disturbances   Complete by: As directed    Call MD for:  extreme fatigue   Complete by: As directed    Call MD for:  persistant nausea and vomiting   Complete by: As directed    Diet - low sodium heart healthy   Complete by: As directed    Diet Carb Modified   Complete by: As directed    Discharge instructions   Complete by: As directed    1.)  3 weeks of isolation from 11/24/2019  2.)  Please ask your primary care practitioner to repeat a chest x-ray in 4 to 6 weeks.  3.)  You are being discharged on home O2-please use it 24/7-and ask your PCP at next visit to reassess whether you still require to be on oxygen.   Follow with Primary MD  Alycia Rossetti, MD in 1-2 weeks  Please get a complete blood count and chemistry panel checked by your Primary MD at your next visit, and again as instructed by your Primary MD.  Get Medicines reviewed and adjusted: Please take all your medications with you for your next visit with your Primary MD  Laboratory/radiological data: Please request your Primary MD to go over all hospital tests  and procedure/radiological results at the follow up, please ask your Primary MD to get all Hospital records sent to his/her office.  In some cases, they will be blood work, cultures and biopsy results pending at the time of your discharge. Please request that your primary care M.D. follows up on these results.  Also Note the following: If you experience worsening of your admission symptoms, develop shortness of breath, life threatening emergency, suicidal or homicidal thoughts you must seek medical attention immediately by calling 911 or calling your MD immediately  if symptoms less severe.  You must read complete instructions/literature along with all the possible adverse reactions/side effects for all the Medicines you take and  that have been prescribed to you. Take any new Medicines after you have completely understood and accpet all the possible adverse reactions/side effects.   Do not drive when taking Pain medications or sleeping medications (Benzodaizepines)  Do not take more than prescribed Pain, Sleep and Anxiety Medications. It is not advisable to combine anxiety,sleep and pain medications without talking with your primary care practitioner  Special Instructions: If you have smoked or chewed Tobacco  in the last 2 yrs please stop smoking, stop any regular Alcohol  and or any Recreational drug use.  Wear Seat belts while driving.  Please note: You were cared for by a hospitalist during your hospital stay. Once you are discharged, your primary care physician will handle any further medical issues. Please note that NO REFILLS for any discharge medications will be authorized once you are discharged, as it is imperative that you return to your primary care physician (or establish a relationship with a primary care physician if you do not have one) for your post hospital discharge needs so that they can reassess your need for medications and monitor your lab values.   Increase activity slowly    Complete by: As directed      Allergies as of 12/10/2019      Reactions   Monopril [fosinopril] Cough   Magnesium-containing Compounds Nausea Only, Other (See Comments)   Re: Mag-Ox at higher doses caused a lot of stomach upset (dose had to be decreased)   Norvasc [amlodipine Besylate] Other (See Comments)   10 mg dose caused LE edema- can tolerate 5 mg dose      Medication List    TAKE these medications   albuterol 108 (90 Base) MCG/ACT inhaler Commonly known as: VENTOLIN HFA Inhale 2 puffs into the lungs every 4 (four) hours as needed for wheezing or shortness of breath.   atorvastatin 10 MG tablet Commonly known as: LIPITOR TAKE 1 TABLET BY MOUTH EVERY DAY What changed:   how much to take  how to take this  when to take this  additional instructions   benzonatate 200 MG capsule Commonly known as: TESSALON Take 1 capsule (200 mg total) by mouth 3 (three) times daily.   CALCIUM 600 + D PO Take 1 tablet by mouth 2 (two) times daily.   carvedilol 12.5 MG tablet Commonly known as: COREG TAKE 1 TABLET (12.5 MG TOTAL) BY MOUTH 2 (TWO) TIMES DAILY WITH A MEAL.   chlorpheniramine-HYDROcodone 10-8 MG/5ML Suer Commonly known as: TUSSIONEX Take 5 mLs by mouth every 12 (twelve) hours as needed for cough.   diltiazem 360 MG 24 hr capsule Commonly known as: CARDIZEM CD TAKE 1 CAPSULE BY MOUTH EVERY DAY What changed: how much to take   Eliquis 5 MG Tabs tablet Generic drug: apixaban TAKE 1 TABLET BY MOUTH TWICE A DAY What changed: how much to take   Enbrel SureClick 50 MG/ML injection Generic drug: etanercept INJECT 50 MG (0.98 ML) UNDER THE SKIN ONCE A WEEK What changed:   how much to take  how to take this  when to take this  additional instructions   estradiol 0.5 MG tablet Commonly known as: ESTRACE Take 0.5 mg by mouth 2 (two) times a day.   fish oil-omega-3 fatty acids 1000 MG capsule Take 1 g by mouth at bedtime.   folic acid 1 MG  tablet Commonly known as: FOLVITE TAKE 1 TABLET BY MOUTH TWICE A DAY   furosemide 40 MG tablet Commonly known as: LASIX Take 1  tablet daily by mouth until swelling resolves then begin taking 1 tablet by mouth every other day. What changed:   how much to take  how to take this  when to take this  additional instructions   leflunomide 10 MG tablet Commonly known as: ARAVA TAKE 1 TABLET BY MOUTH EVERY DAY   levothyroxine 50 MCG tablet Commonly known as: SYNTHROID TAKE 1 TABLET BY MOUTH DAILY BEFORE BREAKFAST What changed: See the new instructions.   losartan 50 MG tablet Commonly known as: COZAAR Take 1 tablet (50 mg total) by mouth daily.   magnesium oxide 400 MG tablet Commonly known as: MAG-OX Take 1,200 mg by mouth 2 (two) times daily.   metFORMIN 500 MG tablet Commonly known as: GLUCOPHAGE Take 1 tablet (500 mg total) by mouth 2 (two) times daily with a meal.   methocarbamol 500 MG tablet Commonly known as: ROBAXIN Take 1 tablet (500 mg total) by mouth daily as needed for muscle spasms.   multivitamins ther. w/minerals Tabs tablet Take 1 tablet by mouth daily.   pantoprazole 40 MG tablet Commonly known as: PROTONIX TAKE 1 TABLET BY MOUTH EVERY DAY   potassium chloride 10 MEQ tablet Commonly known as: KLOR-CON Take 1 tablet (10 mEq total) by mouth daily.   vitamin B-12 500 MCG tablet Commonly known as: CYANOCOBALAMIN Take 500 mcg by mouth daily.   Voltaren 1 % Gel Generic drug: diclofenac sodium Apply 3 g topically daily as needed (to painful sites (in 3 large joints)).            Durable Medical Equipment  (From admission, onward)         Start     Ordered   12/10/19 1041  For home use only DME oxygen  Once    Question Answer Comment  Length of Need 6 Months   Mode or (Route) Nasal cannula   Liters per Minute 3   Frequency Continuous (stationary and portable oxygen unit needed)   Oxygen conserving device Yes   Oxygen delivery system Gas       12/10/19 1041         Follow-up Information    [place], Modena Nunnery, MD. Schedule an appointment as soon as possible for a visit in 1 week(s).   Specialty: Family Medicine Contact information: [address] [place] [address] [phone]        Lorretta Harp, MD. Schedule an appointment as soon as possible for a visit in 1 month(s).   Specialties: Cardiology, Radiology Contact information: [address] [place] [address] [place] [address] [phone]          Allergies  Allergen Reactions  . Monopril [Fosinopril] Cough  . Magnesium-Containing Compounds Nausea Only and Other (See Comments)    Re: Mag-Ox at higher doses caused a lot of stomach upset (dose had to be decreased)  . Norvasc [Amlodipine Besylate] Other (See Comments)    10 mg dose caused LE edema- can tolerate 5 mg dose     Other Procedures/Studies: DG Chest 2 View  Result Date: 12/03/2019 CLINICAL DATA:  Cough, shortness of breath, COVID positive on 11/25/2019 EXAM: CHEST - 2 VIEW COMPARISON:  06/12/2019 and 03/03/2019 chest radiograph. 07/11/2018 CT chest. FINDINGS: Left chest pacing device with leads terminating over the right heart. Mild lung hypoinflation with bibasilar atelectasis. No pneumothorax or pleural effusion. Cardiomediastinal silhouette including aortic atherosclerotic calcifications are unchanged. Multilevel spondylosis.  Right upper quadrant surgical clips. IMPRESSION: No acute airspace disease.  Minimal  bibasilar atelectasis. Electronically Signed   By: Primitivo Gauze M.D.   On: 12/03/2019 12:48   DG Chest Port 1 View  Result Date: 12/09/2019 CLINICAL DATA:  Cough, dyspnea, COVID-19 positive EXAM: PORTABLE CHEST 1 VIEW COMPARISON:  12/03/2019 chest radiograph. 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 Stable cardiomediastinal silhouette with top-normal heart size. No pneumothorax. No pleural effusion. No pulmonary edema. Minimal bibasilar  atelectasis, unchanged. IMPRESSION: Minimal bibasilar atelectasis, unchanged. Electronically Signed   By: Ilona Sorrel M.D.   On: 12/09/2019 10:38   CUP PACEART REMOTE DEVICE CHECK  Result Date: 11/28/2019 Scheduled remote reviewed. Normal device function.  Next remote 91 days. Felisa Bonier, RN, MSN   TODAY-DAY OF DISCHARGE:  Subjective:   [name] today has no headache,no chest abdominal pain,no new weakness tingling or numbness, feels much better wants to go home today.   Objective:   Blood pressure (!) 143/70, pulse 86, temperature 97.6 F (36.4 C), temperature source Oral, resp. rate 15, height 4\' 10"  (1.473 m), weight 82.1 kg, SpO2 (!) 89 %.  Intake/Output Summary (Last 24 hours) at 12/10/2019 1055 Last data filed at 12/10/2019 0553 Gross per 24 hour  Intake 340 ml  Output --  Net 340 ml   Filed Weights   12/03/19 1146 12/04/19 1238  Weight: 84.8 kg 82.1 kg    Exam: Awake Alert, Oriented *3, No new F.N deficits, Normal affect [place].AT,PERRAL Supple Neck,No JVD, No cervical lymphadenopathy appriciated.  Symmetrical Chest wall movement, Good air movement bilaterally, CTAB RRR,No Gallops,Rubs or new Murmurs, No Parasternal Heave +ve B.Sounds, Abd Soft, Non tender, No organomegaly appriciated, No rebound -guarding or rigidity. No Cyanosis, Clubbing or edema, No new Rash or bruise   PERTINENT RADIOLOGIC STUDIES: DG Chest 2 View  Result Date: 12/03/2019 CLINICAL DATA:  Cough, shortness of breath, COVID positive on 11/25/2019 EXAM: CHEST - 2 VIEW COMPARISON:  06/12/2019 and 03/03/2019 chest radiograph. 07/11/2018 CT chest. FINDINGS: Left chest pacing device with leads terminating over the right heart. Mild lung hypoinflation with bibasilar atelectasis. No pneumothorax or pleural effusion. Cardiomediastinal silhouette including aortic atherosclerotic calcifications are unchanged. Multilevel spondylosis.  Right upper quadrant surgical clips. IMPRESSION: No acute airspace disease.   Minimal bibasilar atelectasis. Electronically Signed   By: Primitivo Gauze M.D.   On: 12/03/2019 12:48   DG Chest Port 1 View  Result Date: 12/09/2019 CLINICAL DATA:  Cough, dyspnea, COVID-19 positive EXAM: PORTABLE CHEST 1 VIEW COMPARISON:  12/03/2019 chest radiograph. 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 Stable cardiomediastinal silhouette with top-normal heart size. No pneumothorax. No pleural effusion. No pulmonary edema. Minimal bibasilar atelectasis, unchanged. IMPRESSION: Minimal bibasilar atelectasis, unchanged. Electronically Signed   By: Ilona Sorrel M.D.   On: 12/09/2019 10:38   CUP PACEART REMOTE DEVICE CHECK  Result Date: 11/28/2019 Scheduled remote reviewed. Normal device function.  Next remote 91 days. Felisa Bonier, RN, MSN    PERTINENT LAB RESULTS: CBC: Recent Labs    12/08/19 0708 12/09/19 0414  WBC 11.7* 12.9*  HGB 14.5 14.4  HCT 43.7 43.8  PLT 233 234   CMET CMP     Component Value Date/Time   NA 137 12/09/2019 0414   NA 141 10/17/2019 1318   K 4.2 12/09/2019 0414   CL 102 12/09/2019 0414   CO2 25 12/09/2019 0414   GLUCOSE 215 (H) 12/09/2019 0414   BUN 32 (H) 12/09/2019 0414   BUN 21 10/17/2019 1318   CREATININE 0.99 12/09/2019 0414   CREATININE 1.08 (H)  11/21/2019 0853   CALCIUM 8.5 (L) 12/09/2019 0414   PROT 5.6 (L) 12/09/2019 0414   ALBUMIN 2.3 (L) 12/09/2019 0414   AST 55 (H) 12/09/2019 0414   ALT 41 12/09/2019 0414   ALKPHOS 100 12/09/2019 0414   BILITOT 0.7 12/09/2019 0414   GFRNONAA 56 (L) 12/09/2019 0414   GFRNONAA 53 (L) 10/31/2019 1115   GFRAA >60 12/09/2019 0414   GFRAA 61 10/31/2019 1115    GFR Estimated Creatinine Clearance: 44.5 mL/min (by C-G formula based on SCr of 0.99 mg/dL). No results for input(s): LIPASE, AMYLASE in the last 72 hours. No results for input(s): CKTOTAL, CKMB, CKMBINDEX, TROPONINI in the last 72 hours. Invalid input(s): POCBNP Recent Labs    12/08/19 0708  DDIMER 3.11*   No  results for input(s): HGBA1C in the last 72 hours. No results for input(s): CHOL, HDL, LDLCALC, TRIG, CHOLHDL, LDLDIRECT in the last 72 hours. No results for input(s): TSH, T4TOTAL, T3FREE, THYROIDAB in the last 72 hours.  Invalid input(s): FREET3 Recent Labs    12/08/19 0708  FERRITIN 1,370*   Coags: No results for input(s): INR in the last 72 hours.  Invalid input(s): PT Microbiology: Recent Results (from the past 240 hour(s))  SARS CORONAVIRUS 2 (TAT 6-24 HRS) Nasopharyngeal Nasopharyngeal Swab     Status: Abnormal   Collection Time: 12/03/19  4:30 PM   Specimen: Nasopharyngeal Swab  Result Value Ref Range Status   SARS Coronavirus 2 POSITIVE (A) NEGATIVE Final    Comment: RESULT CALLED TO, READ BACK BY AND VERIFIED WITH: KOTT G, RN AT 2236 ON 12/03/2019 BY SAINVILUS S (NOTE) SARS-CoV-2 target nucleic acids are DETECTED. The SARS-CoV-2 RNA is generally detectable in upper and lower respiratory specimens during the acute phase of infection. Positive results are indicative of the presence of SARS-CoV-2 RNA. Clinical correlation with patient history and other diagnostic information is  necessary to determine patient infection status. Positive results do not rule out bacterial infection or co-infection with other viruses.  The expected result is Negative. Fact Sheet for Patients: SugarRoll.be Fact Sheet for Healthcare Providers: https://www.woods-mathews.com/ This test is not yet approved or cleared by the [place] FDA and  has been authorized for detection and/or diagnosis of SARS-CoV-2 by FDA under an Emergency Use Authorization (EUA). This EUA will remain  in effect (meaning this test can be u sed) for the duration of the COVID-19 declaration under Section 564(b)(1) of the Act, 21 U.S.C. section 360bbb-3(b)(1), unless the authorization is terminated or revoked sooner. Performed at [facility], [place] [address]  [place], [place] [address]     FURTHER DISCHARGE INSTRUCTIONS:  Get Medicines reviewed and adjusted: Please take all your medications with you for your next visit with your Primary MD  Laboratory/radiological data: Please request your Primary MD to go over all hospital tests and procedure/radiological results at the follow up, please ask your Primary MD to get all Hospital records sent to his/her office.  In some cases, they will be blood work, cultures and biopsy results pending at the time of your discharge. Please request that your primary care M.D. goes through all the records of your hospital data and follows up on these results.  Also Note the following: If you experience worsening of your admission symptoms, develop shortness of breath, life threatening emergency, suicidal or homicidal thoughts you must seek medical attention immediately by calling 911 or calling your MD immediately  if symptoms less severe.  You must read complete instructions/literature along with all  the possible adverse reactions/side effects for all the Medicines you take and that have been prescribed to you. Take any new Medicines after you have completely understood and accpet all the possible adverse reactions/side effects.   Do not drive when taking Pain medications or sleeping medications (Benzodaizepines)  Do not take more than prescribed Pain, Sleep and Anxiety Medications. It is not advisable to combine anxiety,sleep and pain medications without talking with your primary care practitioner  Special Instructions: If you have smoked or chewed Tobacco  in the last 2 yrs please stop smoking, stop any regular Alcohol  and or any Recreational drug use.  Wear Seat belts while driving.  Please note: You were cared for by a hospitalist during your hospital stay. Once you are discharged, your primary care physician will handle any further medical issues. Please note that NO REFILLS for any discharge medications will  be authorized once you are discharged, as it is imperative that you return to your primary care physician (or establish a relationship with a primary care physician if you do not have one) for your post hospital discharge needs so that they can reassess your need for medications and monitor your lab values.  Total Time spent coordinating discharge including counseling, education and face to face time equals 35 minutes.  SignedOren Binet 12/10/2019 10:55 AM

## 2019-12-10 NOTE — Progress Notes (Signed)
Physical Therapy Treatment Patient Details Name: RAYANN JIAN MRN: [id number] DOB: 08-Jul-1946 Today's Date: 12/10/2019    History of Present Illness 74 year old female admitted 12/03/19 with SOB, cough, malaise, diarrhea, known +COVID 11/24/19. O2 saturations as low as 87% on room air. Patient with acute hypoxic respiratory failure secondary to COVID being treated wtih IV Remdesivir and steroids. CXR: minimal bibasilar atelectasis. PMH: paroxysmal atrial fibrillation on chronic anticoagulation, hypothyroidism, DM type II, rheumatoid arthritis, GERD    PT Comments    Patient presents with DOE, need for suppl oxgyen, impaired balance, and gait dysfunction. Improved mobility with use of 4WW. Recommend use of 4WW for mobility. Demo and education on safety with brake mgmt and sitting rest break on 4WW seat. Education and demonstration of safety with oxygen tubing with mobility. Patient lives with multiple family members who have recovered from [place] and can assist patient as needed. Patient owns a [phone] and is familiar with its use as she used it after her knee replacement surgery. Recommend home PT services.    Follow Up Recommendations  Home health PT;Supervision/Assistance - 24 hour     Equipment Recommendations  Other (comment)(4WW (patient owns))       Precautions / Restrictions Precautions Precautions: Fall Precaution Comments: +COVID, monitor oxygen saturation Restrictions Weight Bearing Restrictions: No    Mobility  Bed Mobility   General bed mobility comments: Patient ambulating with nurse upon PT entrance.  Transfers Overall transfer level: Needs assistance Equipment used: 4-wheeled walker Transfers: Sit to/from Stand Sit to Stand: Supervision         General transfer comment: supervision stand>sit   Ambulation/Gait Ambulation/Gait assistance: Min assist;Supervision Gait Distance (Feet): 55 Feet(x2) Assistive device: 1 person hand held assist;4-wheeled walker Gait  Pattern/deviations: Staggering left;Staggering right;Step-through pattern Gait velocity: improved with use of 4WW   General Gait Details: Patient unsteady after ambulating approx 7ft in room. Nurse providing HHA for patient remainder of gait trial 1. 4WW for ambulation gait trial 2 back to patient's room with improved gait quality noted and no LOB. Seated rest break during gait trials. Patient initially on 2L [place], nurse increased to 3L during gait trial. Patient owns 4WW from prior knee surgeries   Stairs     Balance Overall balance assessment: Needs assistance         Standing balance support: No upper extremity supported Standing balance-Leahy Scale: Fair    Cognition Arousal/Alertness: Awake/alert Behavior During Therapy: WFL for tasks assessed/performed Overall Cognitive Status: Within Functional Limits for tasks assessed       General Comments General comments (skin integrity, edema, etc.): Nurse present as well, monitoring patient's oxygen saturation response during mobility. Education and demo of safety with oxygen tubing with use of [id number].       Pertinent Vitals/Pain Pain Assessment: No/denies pain(No complaints of pain. No signs/symptoms of pain.)           PT Goals (current goals can now be found in the care plan section) Progress towards PT goals: Progressing toward goals    Frequency    Min 3X/week      PT Plan Discharge plan needs to be updated       AM-PAC PT "6 Clicks" Mobility   Outcome Measure  Help needed turning from your back to your side while in a flat bed without using bedrails?: None Help needed moving from lying on your back to sitting on the side of a flat bed without using bedrails?: None Help needed moving to and from  a bed to a chair (including a wheelchair)?: A Little Help needed standing up from a chair using your arms (e.g., wheelchair or bedside chair)?: None Help needed to walk in hospital room?: A Little Help needed climbing  3-5 steps with a railing? : A Little 6 Click Score: 21    End of Session Equipment Utilized During Treatment: Oxygen Activity Tolerance: Patient tolerated treatment well Patient left: in chair;with nursing/sitter in room Nurse Communication: Other (comment)(nurse present during session) PT Visit Diagnosis: Unsteadiness on feet (R26.81);Other abnormalities of gait and mobility (R26.89)     Time: [id number] PT Time Calculation (min) (ACUTE ONLY): 12 min  Charges:  $Gait Training: 8-22 mins                     Birdie Hopes, PT, DPT Acute Rehab [phone] office     Birdie Hopes 12/10/2019, 9:51 AM

## 2019-12-10 NOTE — TOC Initial Note (Addendum)
Transition of Care [facility]) - Initial/Assessment Note    Patient Details  Name: [name] MRN: [id number] Date of Birth: 07-Jun-1946  [facility]) CM/SW Contact:    Marilu Favre, RN Phone Number: 12/10/2019, 11:11 AM  Clinical Narrative:                 Patient from home with daughter and adult  grand children.   Discussed Home health patient agreeable. Discussed with Dr Sloan Leiter just HHPT/OT needed ( they will teach about new oxygen and check saturations before ,during and after therapy).  Patient has no preference for home health agency. Cory with [facility] able to take for HHPT/OT.   Discussed home oxygen . [facility] will bring portable tank to patient's room prior to discharge and discuss delivery of home oxygen DME with patient.   Patient has a grand child who will provide transportation home.  Patient has a rolator  Patient voiced understanding to all of above.   Expected Discharge Plan: [place] Barriers to Discharge: No Barriers Identified   Patient Goals and CMS Choice Patient states their goals for this hospitalization and ongoing recovery are:: to return to home CMS Medicare.gov Compare Post Acute Care list provided to:: Patient Choice offered to / list presented to : Patient  Expected Discharge Plan and Services Expected Discharge Plan: [place]   Discharge Planning Services: CM Consult Post Acute Care Choice: Home Health, Durable Medical Equipment Living arrangements for the past 2 months: Single Family Home Expected Discharge Date: 12/10/19               DME Arranged: Oxygen DME Agency: [facility] Date DME Agency Contacted: 12/10/19 Time DME Agency Contacted: 45 Representative spoke with at DME Agency: [place] Arranged: PT, OT [place] Agency: [place] Date [place]: 12/10/19 Time [place]: 3 Representative spoke with at [place]: Tommi Rumps  Prior Living  Arrangements/Services Living arrangements for the past 2 months: [place] Lives with:: Adult Children Patient language and need for interpreter reviewed:: Yes Do you feel safe going back to the place where you live?: Yes      Need for Family Participation in Patient Care: Yes (Comment) Care giver support system in place?: Yes (comment) Current home services: DME Criminal Activity/Legal Involvement Pertinent to Current Situation/Hospitalization: No - Comment as needed  Activities of Daily Living Home Assistive Devices/Equipment: CBG Meter, Eyeglasses ADL Screening (condition at time of admission) Patient's cognitive ability adequate to safely complete daily activities?: Yes Is the patient deaf or have difficulty hearing?: No Does the patient have difficulty seeing, even when wearing glasses/contacts?: No Does the patient have difficulty concentrating, remembering, or making decisions?: No Patient able to express need for assistance with ADLs?: No Does the patient have difficulty dressing or bathing?: No Independently performs ADLs?: Yes (appropriate for developmental age) Does the patient have difficulty walking or climbing stairs?: No Weakness of Legs: None Weakness of Arms/Hands: None  Permission Sought/Granted   Permission granted to share information with : No              Emotional Assessment Appearance:: Appears stated age Attitude/Demeanor/Rapport: Engaged Affect (typically observed): Accepting Orientation: : Oriented to Self, Oriented to Place, Oriented to  Time, Oriented to Situation Alcohol / Substance Use: Not Applicable Psych Involvement: No (comment)  Admission diagnosis:  Shortness of breath [R06.02] Hypoxia [R09.02] COVID-19 virus infection [U07.1] COVID-19 [U07.1] Patient Active Problem List  Diagnosis Date Noted  . COVID-19 virus infection 12/03/2019  . Diabetes mellitus type 2, uncontrolled ([place]) 12/03/2019  . Immunocompromised state due to  drug therapy 12/03/2019  . Acute respiratory failure with hypoxia ([place]) 12/03/2019  . 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 Obesity (BMI 30-39.9) 07/04/2019  . Tachy-brady syndrome ([place]) 06/11/2019  . Sinus pause 05/17/2019  . Hypothyroidism 03/28/2019  . Atrial fibrillation ([place])   . Syncope and collapse 03/03/2019  . Diabetes mellitus type 2, uncomplicated ([place]) [id number]  . History of total bilateral knee replacement 06/22/2017  . Primary osteoarthritis of both hands 08/20/2016  . Hypomagnesemia 07/21/2016  . Thyroid nodule 03/05/2016  . Multiple pulmonary nodules 04/04/2014  . Anemia 11/21/2013  . 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 Bursitis of right shoulder   . GERD (gastroesophageal reflux disease)   . 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 without organ or systems involvement ([place])   . Essential hypertension, benign 10/14/2012  . Hyperlipidemia   . Fatty liver disease, nonalcoholic   . Osteoarthritis of right knee 08/05/2012   PCP:  Alycia Rossetti, MD Pharmacy:   [id number] [place], [place] [id number] [place] [id number] Phone: [phone] Fax: [phone]  [facility], [place] [address] [place] [address] Phone: [phone] Fax: [phone]  [facility] [place], [place] - [address] [place] [address] Phone: [phone] Fax: [phone]     Social Determinants of Health ([place]) Interventions    Readmission Risk Interventions Readmission Risk Prevention Plan 12/07/2019  Transportation Screening Complete  PCP or Specialist Appt within 3-5 Days (No Data)  [place] or [place] (No Data)  Medication Review (RN Care Manager) Complete  Some recent data might be hidden

## 2019-12-12 ENCOUNTER — Other Ambulatory Visit: Payer: Self-pay | Admitting: Rheumatology

## 2019-12-12 DIAGNOSIS — Z79899 Other long term (current) drug therapy: Secondary | ICD-10-CM

## 2019-12-12 DIAGNOSIS — M479 Spondylosis, unspecified: Secondary | ICD-10-CM | POA: Diagnosis not present

## 2019-12-12 DIAGNOSIS — M26609 Unspecified temporomandibular joint disorder, unspecified side: Secondary | ICD-10-CM | POA: Diagnosis not present

## 2019-12-12 DIAGNOSIS — U071 COVID-19: Secondary | ICD-10-CM | POA: Diagnosis not present

## 2019-12-12 DIAGNOSIS — M0579 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 without organ or systems involvement: Secondary | ICD-10-CM | POA: Diagnosis not present

## 2019-12-12 DIAGNOSIS — I480 Paroxysmal atrial fibrillation: Secondary | ICD-10-CM | POA: Diagnosis not present

## 2019-12-12 DIAGNOSIS — J9601 Acute respiratory failure with hypoxia: Secondary | ICD-10-CM | POA: Diagnosis not present

## 2019-12-12 DIAGNOSIS — E039 Hypothyroidism, unspecified: Secondary | ICD-10-CM | POA: Diagnosis not present

## 2019-12-12 DIAGNOSIS — J1282 Pneumonia due to coronavirus disease 2019: Secondary | ICD-10-CM | POA: Diagnosis not present

## 2019-12-12 DIAGNOSIS — E1165 Type 2 diabetes mellitus with hyperglycemia: Secondary | ICD-10-CM | POA: Diagnosis not present

## 2019-12-12 DIAGNOSIS — E785 Hyperlipidemia, unspecified: Secondary | ICD-10-CM | POA: Diagnosis not present

## 2019-12-12 DIAGNOSIS — E440 Moderate protein-calorie malnutrition: Secondary | ICD-10-CM | POA: Diagnosis not present

## 2019-12-12 DIAGNOSIS — I700 Atherosclerosis of aorta: Secondary | ICD-10-CM | POA: Diagnosis not present

## 2019-12-12 DIAGNOSIS — T380X5D Adverse effect of glucocorticoids and synthetic analogues, subsequent encounter: Secondary | ICD-10-CM | POA: Diagnosis not present

## 2019-12-12 DIAGNOSIS — J9811 Atelectasis: Secondary | ICD-10-CM | POA: Diagnosis not present

## 2019-12-12 DIAGNOSIS — D84821 Immunodeficiency due to drugs: Secondary | ICD-10-CM | POA: Diagnosis not present

## 2019-12-12 DIAGNOSIS — I10 Essential (primary) hypertension: Secondary | ICD-10-CM | POA: Diagnosis not present

## 2019-12-12 DIAGNOSIS — R918 Other nonspecific abnormal finding of lung field: Secondary | ICD-10-CM | POA: Diagnosis not present

## 2019-12-12 DIAGNOSIS — K760 Fatty (change of) liver, not elsewhere classified: Secondary | ICD-10-CM | POA: Diagnosis not present

## 2019-12-12 DIAGNOSIS — I495 Sick sinus syndrome: Secondary | ICD-10-CM | POA: Diagnosis not present

## 2019-12-12 DIAGNOSIS — M7551 Bursitis of right shoulder: Secondary | ICD-10-CM | POA: Diagnosis not present

## 2019-12-12 DIAGNOSIS — T50995D Adverse effect of other drugs, medicaments and biological substances, subsequent encounter: Secondary | ICD-10-CM | POA: Diagnosis not present

## 2019-12-12 DIAGNOSIS — M160 Bilateral primary osteoarthritis of hip: Secondary | ICD-10-CM | POA: Diagnosis not present

## 2019-12-12 DIAGNOSIS — K219 Gastro-esophageal reflux disease without esophagitis: Secondary | ICD-10-CM | POA: Diagnosis not present

## 2019-12-12 DIAGNOSIS — D62 Acute posthemorrhagic anemia: Secondary | ICD-10-CM | POA: Diagnosis not present

## 2019-12-12 NOTE — Telephone Encounter (Signed)
Advised patient Dr. Estanislado Pandy would like the patient to hold [place].  Her LFTs may be elevated due to recent covid infection.  Recheck CBC and CMP in 3 weeks. Patient verbalized understanding and future lab orders have been placed.

## 2019-12-12 NOTE — Telephone Encounter (Signed)
Last Visit: 10/31/2019 Next Visit: 04/01/2020 Labs: 12/09/2019 WBC 12.9, glucose 215, BUN 32, calcium 8.5, total protein 5.6, albumin 2.3, AST 55. GFR 56.  Okay to refill arava?

## 2019-12-12 NOTE — Telephone Encounter (Signed)
Dr. Estanislado Pandy would like the patient to hold [place].  Her LFTs may be elevated due to recent covid infection.  Recheck CBC and CMP in 3 weeks.

## 2019-12-13 DIAGNOSIS — U071 COVID-19: Secondary | ICD-10-CM | POA: Diagnosis not present

## 2019-12-14 ENCOUNTER — Telehealth: Payer: Self-pay | Admitting: Cardiovascular Disease

## 2019-12-14 NOTE — Telephone Encounter (Signed)
° °  Pt's daughter [name] called, she said pt just released from the hospital and she has f/u with Dr. [name] and chest x-ray, she want sure if Dr. [name] will order the chest x-ray for her or who is going to schedule it  Please advise

## 2019-12-14 NOTE — Telephone Encounter (Signed)
Returned call-spoke to patient, she was d/c from hospital 4/19, needs cxray in 4-6 weeks (hypoxia d/t Covid).    She does have follow up on 5/14 with Dr. [name] and PCP on 5/28.   Advised unsure if Dr. [name] would order or prefer PCP to order at follow up.   Advised to discuss at upcoming visit.   Patient aware and verbalized understanding.

## 2019-12-17 ENCOUNTER — Ambulatory Visit (INDEPENDENT_AMBULATORY_CARE_PROVIDER_SITE_OTHER): Payer: PPO | Admitting: Family Medicine

## 2019-12-17 ENCOUNTER — Encounter: Payer: Self-pay | Admitting: Family Medicine

## 2019-12-17 DIAGNOSIS — J9601 Acute respiratory failure with hypoxia: Secondary | ICD-10-CM

## 2019-12-17 DIAGNOSIS — Z9981 Dependence on supplemental oxygen: Secondary | ICD-10-CM

## 2019-12-17 DIAGNOSIS — E119 Type 2 diabetes mellitus without complications: Secondary | ICD-10-CM

## 2019-12-17 DIAGNOSIS — U071 COVID-19: Secondary | ICD-10-CM

## 2019-12-17 DIAGNOSIS — Z8616 Personal history of COVID-19: Secondary | ICD-10-CM | POA: Diagnosis not present

## 2019-12-17 DIAGNOSIS — J1282 Pneumonia due to coronavirus disease 2019: Secondary | ICD-10-CM

## 2019-12-17 DIAGNOSIS — Z09 Encounter for follow-up examination after completed treatment for conditions other than malignant neoplasm: Secondary | ICD-10-CM

## 2019-12-17 MED ORDER — HYDROCOD POLST-CPM POLST ER 10-8 MG/5ML PO SUER: 5.0000 mL | Freq: Two times a day (BID) | ORAL | 0 refills | Status: DC | PRN

## 2019-12-17 NOTE — Progress Notes (Signed)
Virtual Visit via Telephone Note  I connected with [name] on 12/17/19 at 2:47pm  by telephone and verified that I am speaking with the correct person using two identifiers.      Pt location: at home   Physician location:  In office, [facility], Vic Blackbird MD     On call: patient and physician   I discussed the limitations, risks, security and privacy concerns of performing an evaluation and management service by telephone and the availability of in person appointments. I also discussed with the patient that there may be a patient responsible charge related to this service. The patient expressed understanding and agreed to proceed.   History of Present Illness: Tele-Health visit in setting of COVID-19 pandemic.  Patient recently discharged from the hospital with COVID-19 still with residual cough.  She did not have capabilities for video visit.  Hospital discharge reviewed in detail.  She presented with worsening cough and congestion shortness of breath respiratory failure at this on ,April 3 was found to have COVID-19 positive.  She was  admitted to [facility] secondary to hypoxia  4/12-4/19 from the Covid team pneumonia she received remdesivir along with steroids.  Oxygen therapy.  She did not require intubation.  She slowly improved and was discharged home with oxygen therapy and home health services. Currently on 3L   She is using albuterol inhalers  She was using tussionex but ran out - [facility] , this is only cough medicine that helps control her cough and she is able to sleep. No SE with the medication   Note family members had COVID-19 at same time as her    HTN- She was controlled on her home regimen there were no changes  Diabetes mellitus type 2 with hyperglycemia her last A1c was 8.3% in March I increase her Metformin to 500 mg twice a day.  Her blood sugars were higher during hospitalization secondary to steroids but she was given sliding  scale insulin.   Hospital discharted- Recommend that she have repeat metabolic panel in 1 week and repeat chest x-ray in 4 to 6 weeks    Observations/Objective: NAD noted over phone, able to speak in full sentences between with severe coughing fits between, no audible wheeze    Assessment and Plan: COVID-19 infection with PNA and hypoxia , now oxygen dependent on 3L   Will continue oxygen, albuterol, refilled tussionex  change F/U to next week May 4th at 11am, will do labs at that time and assess hypoxia  DM- expect CBG to come down off IV steroids, continue metformin 500mg  BID recently increased  HTn- controlled on home meds, no changes  AKI- recheck renal function at OV   Follow Up Instructions:    I discussed the assessment and treatment plan with the patient. The patient was provided an opportunity to ask questions and all were answered. The patient agreed with the plan and demonstrated an understanding of the instructions.   The patient was advised to call back or seek an in-person evaluation if the symptoms worsen or if the condition fails to improve as anticipated.  I provided 13 minutes of non-face-to-face time during this encounter. 3:00pm  Vic Blackbird, MD

## 2019-12-24 DIAGNOSIS — R918 Other nonspecific abnormal finding of lung field: Secondary | ICD-10-CM | POA: Diagnosis not present

## 2019-12-24 DIAGNOSIS — I480 Paroxysmal atrial fibrillation: Secondary | ICD-10-CM | POA: Diagnosis not present

## 2019-12-24 DIAGNOSIS — M26609 Unspecified temporomandibular joint disorder, unspecified side: Secondary | ICD-10-CM | POA: Diagnosis not present

## 2019-12-24 DIAGNOSIS — K760 Fatty (change of) liver, not elsewhere classified: Secondary | ICD-10-CM | POA: Diagnosis not present

## 2019-12-24 DIAGNOSIS — E1165 Type 2 diabetes mellitus with hyperglycemia: Secondary | ICD-10-CM | POA: Diagnosis not present

## 2019-12-24 DIAGNOSIS — K219 Gastro-esophageal reflux disease without esophagitis: Secondary | ICD-10-CM | POA: Diagnosis not present

## 2019-12-24 DIAGNOSIS — T50995D Adverse effect of other drugs, medicaments and biological substances, subsequent encounter: Secondary | ICD-10-CM | POA: Diagnosis not present

## 2019-12-24 DIAGNOSIS — E440 Moderate protein-calorie malnutrition: Secondary | ICD-10-CM | POA: Diagnosis not present

## 2019-12-24 DIAGNOSIS — M0579 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 without organ or systems involvement: Secondary | ICD-10-CM | POA: Diagnosis not present

## 2019-12-24 DIAGNOSIS — J1282 Pneumonia due to coronavirus disease 2019: Secondary | ICD-10-CM | POA: Diagnosis not present

## 2019-12-24 DIAGNOSIS — I10 Essential (primary) hypertension: Secondary | ICD-10-CM | POA: Diagnosis not present

## 2019-12-24 DIAGNOSIS — T380X5D Adverse effect of glucocorticoids and synthetic analogues, subsequent encounter: Secondary | ICD-10-CM | POA: Diagnosis not present

## 2019-12-24 DIAGNOSIS — E785 Hyperlipidemia, unspecified: Secondary | ICD-10-CM | POA: Diagnosis not present

## 2019-12-24 DIAGNOSIS — U071 COVID-19: Secondary | ICD-10-CM | POA: Diagnosis not present

## 2019-12-24 DIAGNOSIS — J9601 Acute respiratory failure with hypoxia: Secondary | ICD-10-CM | POA: Diagnosis not present

## 2019-12-24 DIAGNOSIS — M7551 Bursitis of right shoulder: Secondary | ICD-10-CM | POA: Diagnosis not present

## 2019-12-24 DIAGNOSIS — M160 Bilateral primary osteoarthritis of hip: Secondary | ICD-10-CM | POA: Diagnosis not present

## 2019-12-24 DIAGNOSIS — M479 Spondylosis, unspecified: Secondary | ICD-10-CM | POA: Diagnosis not present

## 2019-12-24 DIAGNOSIS — I700 Atherosclerosis of aorta: Secondary | ICD-10-CM | POA: Diagnosis not present

## 2019-12-24 DIAGNOSIS — D62 Acute posthemorrhagic anemia: Secondary | ICD-10-CM | POA: Diagnosis not present

## 2019-12-24 DIAGNOSIS — E039 Hypothyroidism, unspecified: Secondary | ICD-10-CM | POA: Diagnosis not present

## 2019-12-24 DIAGNOSIS — I495 Sick sinus syndrome: Secondary | ICD-10-CM | POA: Diagnosis not present

## 2019-12-24 DIAGNOSIS — D84821 Immunodeficiency due to drugs: Secondary | ICD-10-CM | POA: Diagnosis not present

## 2019-12-24 DIAGNOSIS — J9811 Atelectasis: Secondary | ICD-10-CM | POA: Diagnosis not present

## 2019-12-25 ENCOUNTER — Other Ambulatory Visit: Payer: Self-pay

## 2019-12-25 ENCOUNTER — Encounter: Payer: Self-pay | Admitting: Family Medicine

## 2019-12-25 ENCOUNTER — Ambulatory Visit (INDEPENDENT_AMBULATORY_CARE_PROVIDER_SITE_OTHER): Payer: PPO | Admitting: Family Medicine

## 2019-12-25 VITALS — BP 148/86 | HR 86 | Temp 98.6°F | Resp 18 | Ht <= 58 in | Wt 187.0 lb

## 2019-12-25 DIAGNOSIS — I480 Paroxysmal atrial fibrillation: Secondary | ICD-10-CM | POA: Diagnosis not present

## 2019-12-25 DIAGNOSIS — E119 Type 2 diabetes mellitus without complications: Secondary | ICD-10-CM | POA: Diagnosis not present

## 2019-12-25 DIAGNOSIS — Z9981 Dependence on supplemental oxygen: Secondary | ICD-10-CM

## 2019-12-25 DIAGNOSIS — I10 Essential (primary) hypertension: Secondary | ICD-10-CM | POA: Diagnosis not present

## 2019-12-25 DIAGNOSIS — R609 Edema, unspecified: Secondary | ICD-10-CM

## 2019-12-25 DIAGNOSIS — J9611 Chronic respiratory failure with hypoxia: Secondary | ICD-10-CM

## 2019-12-25 DIAGNOSIS — Z8616 Personal history of COVID-19: Secondary | ICD-10-CM | POA: Diagnosis not present

## 2019-12-25 NOTE — Progress Notes (Signed)
Subjective:    Patient ID: [name], female    DOB: 07-21-1946, 74 y.o.   MRN: [id number]  Patient presents for Hospital F/U  Patient recently discharged from the hospital with COVID-19 still with residual cough.  She did not have capabilities for video visit.  Hospital discharge reviewed in detail.  She presented with worsening cough and congestion shortness of breath respiratory failure at this on ,April 3 was found to have COVID-19 positive.  She was  admitted to [facility] secondary to hypoxia  4/12-4/19 from the Covid team pneumonia she received remdesivir along with steroids.  Oxygen therapy.  She did not require intubation.  She slowly improved and was discharged home with oxygen therapy and home health services. Currently on 3L   She is using albuterol inhalers  Using tussionex and tessalon perrles  for cough this helps the most   She has tried coming off her oxygen but only last a few minutes and feels like cant breath    Note family members had COVID-19 at same time as her    HTN- She was controlled on her home regimen there were no changes  She has been checking BP at home and it has been normal  PT and nurse check BP has been  [id number] systolic     Diabetes mellitus type 2 with hyperglycemia her last A1c was 8.3% in March I increase her Metformin to 500 mg twice a day.  Her blood sugars were higher during hospitalization secondary to steroids but she was given sliding scale insulin.  she has not checked CBG recently   Hospital discharted- Recommend that she have repeat metabolic panel in 1 week and repeat chest x-ray in 4 to 6 weeks  CHF- taking lasix and potassium   She is staying with daughter- she has help    Getting PT twice a week     Review Of Systems:  GEN- denies fatigue, fever, weight loss+,weakness, recent illness HEENT- denies eye drainage, change in vision, nasal discharge, CVS- denies chest pain, palpitations RESP- +SOB,+ cough,  wheeze ABD- denies N/V, change in stools, abd pain GU- denies dysuria, hematuria, dribbling, incontinence MSK- denies joint pain, muscle aches, injury Neuro- denies headache, dizziness, syncope, seizure activity       Objective:    BP (!) 148/86   Pulse 86   Temp 98.6 F (37 C) (Temporal)   Resp 18   Ht 4\' 10"  (1.473 m)   Wt 187 lb (84.8 kg)   SpO2 92% Comment: 3L/min via [place]  BMI 39.08 kg/m  GEN- NAD, alert and oriented x3, using rollator  HEENT- PERRL, EOMI, non injected sclera, pink conjunctiva, MMM, oropharynx clear Neck- Supple, no thyromegaly CVS- irregular rhythem, normal rate , no murmur RESP-clear, normal WOB at rest on oxygen 3L ABD-NABS,soft,NT,ND EXT- 1+ bilat LE  edema Pulses- Radial, 2+        Assessment & Plan:      Problem List Items Addressed This Visit      Unprioritized   Atrial fibrillation ([place])    Rate controlled on anticogulation Has cardiology f/u scheduled       Chronic respiratory failure ([place])    Secondary to covid -19 infection Unable to come off oxygen today Recheck in 1 month       Diabetes mellitus type 2, uncomplicated (H[facility])    Check A1C, she has been on steroids Adjust meds as needed       Relevant Orders   Comprehensive  metabolic panel (Completed)   Hemoglobin A1c (Completed)   Essential hypertension, benign - Primary    bp a little elevated but also with some fluid overload Increase lasix to BID for the next 3 days, then resume daily dosing Continue to check BP at home       Relevant Orders   CBC with Differential/Platelet (Completed)   Comprehensive metabolic panel (Completed)   Peripheral edema    Other Visit Diagnoses    Oxygen dependent       Relevant Orders   DG Chest 2 View   History of COVID-19       Repeat CXR next Friday   Relevant Orders   DG Chest 2 View      Note: This dictation was prepared with Dragon dictation along with smaller phrase technology. Any transcriptional errors that result  from this process are unintentional.

## 2019-12-25 NOTE — Patient Instructions (Addendum)
Continue oxygen therapy , continue 3L  Get the chest xray next Friday the [place] [facility] Continue current medications We will call with lab results   Take lasix 40mg  twice a for the next 3 days   Elevate your feet   F/U 1 month

## 2019-12-26 ENCOUNTER — Encounter: Payer: Self-pay | Admitting: Family Medicine

## 2019-12-26 DIAGNOSIS — R609 Edema, unspecified: Secondary | ICD-10-CM | POA: Insufficient documentation

## 2019-12-26 LAB — COMPREHENSIVE METABOLIC PANEL
AG Ratio: 0.7 (calc) — ABNORMAL LOW (ref 1.0–2.5)
ALT: 15 U/L (ref 6–29)
AST: 18 U/L (ref 10–35)
Albumin: 2.3 g/dL — ABNORMAL LOW (ref 3.6–5.1)
Alkaline phosphatase (APISO): 173 U/L — ABNORMAL HIGH (ref 37–153)
BUN: 14 mg/dL (ref 7–25)
CO2: 28 mmol/L (ref 20–32)
Calcium: 8.6 mg/dL (ref 8.6–10.4)
Chloride: 98 mmol/L (ref 98–110)
Creat: 0.91 mg/dL (ref 0.60–0.93)
Globulin: 3.3 g/dL (calc) (ref 1.9–3.7)
Glucose, Bld: 213 mg/dL — ABNORMAL HIGH (ref 65–99)
Potassium: 3.8 mmol/L (ref 3.5–5.3)
Sodium: 136 mmol/L (ref 135–146)
Total Bilirubin: 0.8 mg/dL (ref 0.2–1.2)
Total Protein: 5.6 g/dL — ABNORMAL LOW (ref 6.1–8.1)

## 2019-12-26 LAB — CBC WITH DIFFERENTIAL/PLATELET
Absolute Monocytes: 980 cells/uL — ABNORMAL HIGH (ref 200–950)
Basophils Absolute: 50 cells/uL (ref 0–200)
Basophils Relative: 0.8 %
Eosinophils Absolute: 279 cells/uL (ref 15–500)
Eosinophils Relative: 4.5 %
HCT: 36.5 % (ref 35.0–45.0)
Hemoglobin: 11.7 g/dL (ref 11.7–15.5)
Lymphs Abs: 1240 cells/uL (ref 850–3900)
MCH: 30.7 pg (ref 27.0–33.0)
MCHC: 32.1 g/dL (ref 32.0–36.0)
MCV: 95.8 fL (ref 80.0–100.0)
MPV: 13.2 fL — ABNORMAL HIGH (ref 7.5–12.5)
Monocytes Relative: 15.8 %
Neutro Abs: 3652 cells/uL (ref 1500–7800)
Neutrophils Relative %: 58.9 %
Platelets: 170 10*3/uL (ref 140–400)
RBC: 3.81 10*6/uL (ref 3.80–5.10)
RDW: 13.4 % (ref 11.0–15.0)
Total Lymphocyte: 20 %
WBC: 6.2 10*3/uL (ref 3.8–10.8)

## 2019-12-26 LAB — HEMOGLOBIN A1C
Hgb A1c MFr Bld: 8.9 % of total Hgb — ABNORMAL HIGH (ref <5.7)
Mean Plasma Glucose: 209 (calc)
eAG (mmol/L): 11.6 (calc)

## 2019-12-26 NOTE — Assessment & Plan Note (Signed)
Rate controlled on anticogulation Has cardiology f/u scheduled

## 2019-12-26 NOTE — Assessment & Plan Note (Signed)
Secondary to covid -19 infection Unable to come off oxygen today Recheck in 1 month

## 2019-12-26 NOTE — Assessment & Plan Note (Signed)
Check A1C, she has been on steroids Adjust meds as needed

## 2019-12-26 NOTE — Assessment & Plan Note (Signed)
bp a little elevated but also with some fluid overload Increase lasix to BID for the next 3 days, then resume daily dosing Continue to check BP at home

## 2019-12-27 ENCOUNTER — Other Ambulatory Visit: Payer: Self-pay | Admitting: *Deleted

## 2019-12-27 MED ORDER — METFORMIN HCL 500 MG PO TABS: 1000.0000 mg | ORAL_TABLET | Freq: Two times a day (BID) | ORAL | 3 refills | Status: AC

## 2019-12-30 ENCOUNTER — Other Ambulatory Visit: Payer: Self-pay | Admitting: Family Medicine

## 2020-01-02 ENCOUNTER — Other Ambulatory Visit: Payer: Self-pay

## 2020-01-02 DIAGNOSIS — Z79899 Other long term (current) drug therapy: Secondary | ICD-10-CM | POA: Diagnosis not present

## 2020-01-03 LAB — CBC WITH DIFFERENTIAL/PLATELET
Absolute Monocytes: 1283 cells/uL — ABNORMAL HIGH (ref 200–950)
Basophils Absolute: 112 cells/uL (ref 0–200)
Basophils Relative: 1.2 %
Eosinophils Absolute: 288 cells/uL (ref 15–500)
Eosinophils Relative: 3.1 %
HCT: 33 % — ABNORMAL LOW (ref 35.0–45.0)
Hemoglobin: 10.6 g/dL — ABNORMAL LOW (ref 11.7–15.5)
Lymphs Abs: 1163 cells/uL (ref 850–3900)
MCH: 30.4 pg (ref 27.0–33.0)
MCHC: 32.1 g/dL (ref 32.0–36.0)
MCV: 94.6 fL (ref 80.0–100.0)
MPV: 12.6 fL — ABNORMAL HIGH (ref 7.5–12.5)
Monocytes Relative: 13.8 %
Neutro Abs: 6454 cells/uL (ref 1500–7800)
Neutrophils Relative %: 69.4 %
Platelets: 342 10*3/uL (ref 140–400)
RBC: 3.49 10*6/uL — ABNORMAL LOW (ref 3.80–5.10)
RDW: 13.8 % (ref 11.0–15.0)
Total Lymphocyte: 12.5 %
WBC: 9.3 10*3/uL (ref 3.8–10.8)

## 2020-01-03 LAB — COMPLETE METABOLIC PANEL WITH GFR
AG Ratio: 0.7 (calc) — ABNORMAL LOW (ref 1.0–2.5)
ALT: 13 U/L (ref 6–29)
AST: 24 U/L (ref 10–35)
Albumin: 2.3 g/dL — ABNORMAL LOW (ref 3.6–5.1)
Alkaline phosphatase (APISO): 131 U/L (ref 37–153)
BUN/Creatinine Ratio: 24 (calc) — ABNORMAL HIGH (ref 6–22)
BUN: 33 mg/dL — ABNORMAL HIGH (ref 7–25)
CO2: 26 mmol/L (ref 20–32)
Calcium: 8.2 mg/dL — ABNORMAL LOW (ref 8.6–10.4)
Chloride: 96 mmol/L — ABNORMAL LOW (ref 98–110)
Creat: 1.39 mg/dL — ABNORMAL HIGH (ref 0.60–0.93)
GFR, Est African American: 43 mL/min/{1.73_m2} — ABNORMAL LOW (ref >=60)
GFR, Est Non African American: 37 mL/min/{1.73_m2} — ABNORMAL LOW (ref >=60)
Globulin: 3.4 g/dL (calc) (ref 1.9–3.7)
Glucose, Bld: 219 mg/dL — ABNORMAL HIGH (ref 65–99)
Potassium: 4.8 mmol/L (ref 3.5–5.3)
Sodium: 135 mmol/L (ref 135–146)
Total Bilirubin: 0.7 mg/dL (ref 0.2–1.2)
Total Protein: 5.7 g/dL — ABNORMAL LOW (ref 6.1–8.1)

## 2020-01-03 NOTE — Progress Notes (Signed)
Glucose is elevated.  GFR is low.  Patient is on Lasix.  Anemia noted.  Please forward labs to her PCP.  No change in therapy advised.  She should repeat labs in 1 month.

## 2020-01-04 ENCOUNTER — Ambulatory Visit: Payer: PPO | Admitting: Cardiovascular Disease

## 2020-01-07 DIAGNOSIS — I495 Sick sinus syndrome: Secondary | ICD-10-CM | POA: Diagnosis not present

## 2020-01-07 DIAGNOSIS — J1282 Pneumonia due to coronavirus disease 2019: Secondary | ICD-10-CM | POA: Diagnosis not present

## 2020-01-07 DIAGNOSIS — M0579 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 without organ or systems involvement: Secondary | ICD-10-CM | POA: Diagnosis not present

## 2020-01-07 DIAGNOSIS — T380X5D Adverse effect of glucocorticoids and synthetic analogues, subsequent encounter: Secondary | ICD-10-CM | POA: Diagnosis not present

## 2020-01-07 DIAGNOSIS — U071 COVID-19: Secondary | ICD-10-CM | POA: Diagnosis not present

## 2020-01-07 DIAGNOSIS — M160 Bilateral primary osteoarthritis of hip: Secondary | ICD-10-CM | POA: Diagnosis not present

## 2020-01-07 DIAGNOSIS — I10 Essential (primary) hypertension: Secondary | ICD-10-CM | POA: Diagnosis not present

## 2020-01-07 DIAGNOSIS — D62 Acute posthemorrhagic anemia: Secondary | ICD-10-CM | POA: Diagnosis not present

## 2020-01-07 DIAGNOSIS — D84821 Immunodeficiency due to drugs: Secondary | ICD-10-CM | POA: Diagnosis not present

## 2020-01-07 DIAGNOSIS — M479 Spondylosis, unspecified: Secondary | ICD-10-CM | POA: Diagnosis not present

## 2020-01-07 DIAGNOSIS — E1165 Type 2 diabetes mellitus with hyperglycemia: Secondary | ICD-10-CM | POA: Diagnosis not present

## 2020-01-07 DIAGNOSIS — I700 Atherosclerosis of aorta: Secondary | ICD-10-CM | POA: Diagnosis not present

## 2020-01-07 DIAGNOSIS — I480 Paroxysmal atrial fibrillation: Secondary | ICD-10-CM | POA: Diagnosis not present

## 2020-01-07 DIAGNOSIS — K760 Fatty (change of) liver, not elsewhere classified: Secondary | ICD-10-CM | POA: Diagnosis not present

## 2020-01-07 DIAGNOSIS — M7551 Bursitis of right shoulder: Secondary | ICD-10-CM | POA: Diagnosis not present

## 2020-01-07 DIAGNOSIS — E039 Hypothyroidism, unspecified: Secondary | ICD-10-CM | POA: Diagnosis not present

## 2020-01-07 DIAGNOSIS — T50995D Adverse effect of other drugs, medicaments and biological substances, subsequent encounter: Secondary | ICD-10-CM | POA: Diagnosis not present

## 2020-01-07 DIAGNOSIS — J9601 Acute respiratory failure with hypoxia: Secondary | ICD-10-CM | POA: Diagnosis not present

## 2020-01-07 DIAGNOSIS — K219 Gastro-esophageal reflux disease without esophagitis: Secondary | ICD-10-CM | POA: Diagnosis not present

## 2020-01-07 DIAGNOSIS — J9811 Atelectasis: Secondary | ICD-10-CM | POA: Diagnosis not present

## 2020-01-07 DIAGNOSIS — R918 Other nonspecific abnormal finding of lung field: Secondary | ICD-10-CM | POA: Diagnosis not present

## 2020-01-07 DIAGNOSIS — M26609 Unspecified temporomandibular joint disorder, unspecified side: Secondary | ICD-10-CM | POA: Diagnosis not present

## 2020-01-07 DIAGNOSIS — E785 Hyperlipidemia, unspecified: Secondary | ICD-10-CM | POA: Diagnosis not present

## 2020-01-07 DIAGNOSIS — E440 Moderate protein-calorie malnutrition: Secondary | ICD-10-CM | POA: Diagnosis not present

## 2020-01-09 ENCOUNTER — Ambulatory Visit: Payer: PPO | Admitting: Cardiovascular Disease

## 2020-01-09 ENCOUNTER — Encounter: Payer: Self-pay | Admitting: Cardiovascular Disease

## 2020-01-09 ENCOUNTER — Other Ambulatory Visit: Payer: Self-pay

## 2020-01-09 DIAGNOSIS — I480 Paroxysmal atrial fibrillation: Secondary | ICD-10-CM

## 2020-01-09 DIAGNOSIS — I10 Essential (primary) hypertension: Secondary | ICD-10-CM

## 2020-01-09 DIAGNOSIS — R609 Edema, unspecified: Secondary | ICD-10-CM | POA: Diagnosis not present

## 2020-01-09 DIAGNOSIS — E782 Mixed hyperlipidemia: Secondary | ICD-10-CM | POA: Diagnosis not present

## 2020-01-09 DIAGNOSIS — U071 COVID-19: Secondary | ICD-10-CM | POA: Diagnosis not present

## 2020-01-09 DIAGNOSIS — R600 Localized edema: Secondary | ICD-10-CM

## 2020-01-09 MED ORDER — FUROSEMIDE 40 MG PO TABS: 40.0000 mg | ORAL_TABLET | Freq: Two times a day (BID) | ORAL | 2 refills | Status: AC

## 2020-01-09 MED ORDER — CARVEDILOL 12.5 MG PO TABS: 18.7500 mg | ORAL_TABLET | Freq: Two times a day (BID) | ORAL | 2 refills | Status: DC

## 2020-01-09 NOTE — Assessment & Plan Note (Signed)
History of essential hypertension blood pressure measured today at 112/72.  She is on current carvedilol, Cardizem and losartan.

## 2020-01-09 NOTE — Patient Instructions (Addendum)
Medication Instructions:  Increase Furosemide to 40 mg twice daily  Increase Carvedilol to 18.75 twice daily (1.5 tablet twice daily)  *If you need a refill on your cardiac medications before your next appointment, please call your pharmacy*  Lab: BMET in 10 days  Follow-Up: At [facility], you and your health needs are our priority.  As part of our continuing mission to provide you with exceptional heart care, we have created designated Provider Care Teams.  These Care Teams include your primary Cardiologist (physician) and Advanced Practice Providers (APPs -  Physician Assistants and Nurse Practitioners) who all work together to provide you with the care you need, when you need it.  We recommend signing up for the patient portal called "MyChart".  Sign up information is provided on this After Visit Summary.  MyChart is used to connect with patients for Virtual Visits (Telemedicine).  Patients are able to view lab/test results, encounter notes, upcoming appointments, etc.  Non-urgent messages can be sent to your provider as well.   To learn more about what you can do with MyChart, go to [email or web address].    Your next appointment:   4-6 week(s)  The format for your next appointment:   In Person  Provider:   Quay Burow, MD   Other Instructions Appointment with [facility] either Friday or Monday-

## 2020-01-09 NOTE — Assessment & Plan Note (Signed)
History of PAF on Eliquis oral anticoagulation status post permanent transvenous pacemaker placed by Dr. Caryl Comes 06/11/2019.  She was recently hospitalized several weeks ago for Covid pneumonia.  She is currently in A. fib with RVR with a heart rate in the 130 range.  I am going to increase her carvedilol from 12.5 to 18.75 mg p.o. twice daily and will have her see the A. fib clinic in the next several days.  She may need outpatient cardioversion.

## 2020-01-09 NOTE — Progress Notes (Signed)
01/09/2020 [name]   Oct 06, 1945  [id number]  Primary Physician Katherine Rossetti, MD Primary Cardiologist: Katherine Harp MD [name], [place]  HPI:  [name] is a 74 y.o.   moderately overweight divorced Caucasian female mother of 1 daughter, grandmother 3 grandchildren referred from her hospitalization to be established in my practice. Her primary care provider is Dr. Vic Alexander. 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 I last saw her in the office  11/13/2019. She is accompanied by one of her granddaughters today.  Her risk factor profile is notable for remote tobacco abuse, family history with mother and father both who have had ischemic heart disease as well as treated hypertension, diabetes and hyperlipidemia. She had been feeling sick with nausea vomiting and dehydration prior to her admission 03/02/2014. She was discharged 4 days later. She did have a 2D echo that was essentially normal and a Myoview stress test that was normal as well. She denies chest pain or shortness of breath. She was discharged home on Eliquis oral anticoagulation. She is been fairly asymptomatic since.  I did place a 2-week ZIO patch on her which revealed sinus rhythm/sinus bradycardia, PVCs with episodes of nonsustained ventricular tachycardia and pauses up to 4 seconds. She is on diltiazem and metoprolol. She has complained of episodes of dizziness but no frank syncope.  I referred her to [name] who placed a permanent transvenous pacemaker on 06/11/2019.  Since that time she is felt clinically improved.  Since I saw her 2 months ago she was admitted to the hospital 12/03/2019 with Covid pneumonia and discharged 1 week later.  She continues to be on nasal prong oxygen.  She feels weak and somewhat short of breath.  She is on furosemide for peripheral edema.   Current Meds  Medication Sig  . albuterol (VENTOLIN HFA) 108 (90  Base) MCG/ACT inhaler Inhale 2 puffs into the lungs every 4 (four) hours as needed for wheezing or shortness of breath.  Marland Kitchen atorvastatin (LIPITOR) 10 MG tablet TAKE 1 TABLET BY MOUTH EVERY DAY (Patient taking differently: Take 10 mg by mouth daily. )  . benzonatate (TESSALON) 200 MG capsule Take 1 capsule (200 mg total) by mouth 3 (three) times daily.  . Calcium Carbonate-Vitamin D (CALCIUM 600 + D PO) Take 1 tablet by mouth 2 (two) times daily.  . carvedilol (COREG) 12.5 MG tablet Take 1.5 tablets (18.75 mg total) by mouth 2 (two) times daily with a meal.  . chlorpheniramine-HYDROcodone (TUSSIONEX) 10-8 MG/5ML SUER Take 5 mLs by mouth every 12 (twelve) hours as needed for cough.  . diclofenac sodium (VOLTAREN) 1 % GEL Apply 3 g topically daily as needed (to painful sites (in 3 large joints)).   Marland Kitchen diltiazem (CARDIZEM CD) 360 MG 24 hr capsule TAKE 1 CAPSULE BY MOUTH EVERY DAY (Patient taking differently: Take 360 mg by mouth daily. )  . ELIQUIS 5 MG TABS tablet TAKE 1 TABLET BY MOUTH TWICE A DAY (Patient taking differently: Take 5 mg by mouth 2 (two) times daily. )  . estradiol (ESTRACE) 0.5 MG tablet Take 0.5 mg by mouth 2 (two) times a day.  . etanercept (ENBREL SURECLICK) 50 MG/ML injection INJECT 50 MG (0.98 ML) UNDER THE SKIN ONCE A WEEK (Patient taking differently: Inject 50 mg into the skin once a week. Thursdays (0.98 ML))  . fish oil-omega-3 fatty acids 1000 MG capsule Take 1 g by mouth at  bedtime.   . folic acid (FOLVITE) 1 MG tablet TAKE 1 TABLET BY MOUTH TWICE A DAY (Patient taking differently: Take 1 mg by mouth 2 (two) times daily. )  . furosemide (LASIX) 40 MG tablet Take 1 tablet (40 mg total) by mouth 2 (two) times daily.  Marland Kitchen leflunomide (ARAVA) 10 MG tablet TAKE 1 TABLET BY MOUTH EVERY DAY (Patient taking differently: Take 10 mg by mouth daily. )  . levothyroxine (SYNTHROID) 50 MCG tablet TAKE 1 TABLET BY MOUTH DAILY BEFORE BREAKFAST (Patient taking differently: Take 50 mcg by mouth  daily before breakfast. )  . losartan (COZAAR) 50 MG tablet Take 1 tablet (50 mg total) by mouth daily.  . magnesium oxide (MAG-OX) 400 MG tablet Take 1,200 mg by mouth 2 (two) times daily.   . metFORMIN (GLUCOPHAGE) 500 MG tablet Take 2 tablets (1,000 mg total) by mouth 2 (two) times daily with a meal.  . methocarbamol (ROBAXIN) 500 MG tablet Take 1 tablet (500 mg total) by mouth daily as needed for muscle spasms.  . Multiple Vitamins-Minerals (MULTIVITAMINS THER. W/MINERALS) TABS Take 1 tablet by mouth daily.  . pantoprazole (PROTONIX) 40 MG tablet TAKE 1 TABLET BY MOUTH EVERY DAY (Patient taking differently: Take 40 mg by mouth daily. )  . potassium chloride (KLOR-CON) 10 MEQ tablet Take 1 tablet (10 mEq total) by mouth daily.  . vitamin B-12 (CYANOCOBALAMIN) 500 MCG tablet Take 500 mcg by mouth daily.  . [DISCONTINUED] carvedilol (COREG) 12.5 MG tablet TAKE 1 TABLET (12.5 MG TOTAL) BY MOUTH 2 (TWO) TIMES DAILY WITH A MEAL.  . [DISCONTINUED] furosemide (LASIX) 40 MG tablet Take 1 tablet daily by mouth until swelling resolves then begin taking 1 tablet by mouth every other day. (Patient taking differently: Take 40 mg by mouth every other day. )     Allergies  Allergen Reactions  . Monopril [Fosinopril] Cough  . Magnesium-Containing Compounds Nausea Only and Other (See Comments)    Re: Mag-Ox at higher doses caused a lot of stomach upset (dose had to be decreased)  . Norvasc [Amlodipine Besylate] Other (See Comments)    10 mg dose caused LE edema- can tolerate 5 mg dose    Social History   Socioeconomic History  . Marital status: Divorced    Spouse name: Not on file  . Number of children: Not on file  . Years of education: Not on file  . Highest education level: Not on file  Occupational History  . Not on file  Tobacco Use  . Smoking status: Former Smoker    Packs/day: 0.25    Years: 2.00    Pack years: 0.50    Types: Cigarettes  . Smokeless tobacco: Never Used  Substance and  Sexual Activity  . Alcohol use: Yes    Comment: occasional  . Drug use: Never  . Sexual activity: Never  Other Topics Concern  . Not on file  Social History Narrative  . Not on file   Social Determinants of Health   Financial Resource Strain:   . Difficulty of Paying Living Expenses:   Food Insecurity:   . Worried About Charity fundraiser in the Last Year:   . Arboriculturist in the Last Year:   Transportation Needs:   . Film/video editor (Medical):   Marland Kitchen Lack of Transportation (Non-Medical):   Physical Activity:   . Days of Exercise per Week:   . Minutes of Exercise per Session:   Stress:   . Feeling  of Stress :   Social Connections:   . Frequency of Communication with Friends and Family:   . Frequency of Social Gatherings with Friends and Family:   . Attends Religious Services:   . Active Member of Clubs or Organizations:   . Attends Archivist Meetings:   Marland Kitchen Marital Status:   Intimate Partner Violence:   . Fear of Current or Ex-Partner:   . Emotionally Abused:   Marland Kitchen Physically Abused:   . Sexually Abused:      Review of Systems: General: negative for chills, fever, night sweats or weight changes.  Cardiovascular: negative for chest pain, dyspnea on exertion, edema, orthopnea, palpitations, paroxysmal nocturnal dyspnea or shortness of breath Dermatological: negative for rash Respiratory: negative for cough or wheezing Urologic: negative for hematuria Abdominal: negative for nausea, vomiting, diarrhea, bright red blood per rectum, melena, or hematemesis Neurologic: negative for visual changes, syncope, or dizziness All other systems reviewed and are otherwise negative except as noted above.    Blood pressure 112/72, pulse (!) 139, height 4\' 10"  (1.473 m), weight 186 lb 6.4 oz (84.6 kg), SpO2 96 %.  General appearance: alert and no distress Neck: no adenopathy, no carotid bruit, no JVD, supple, symmetrical, trachea midline and thyroid not enlarged,  symmetric, no tenderness/mass/nodules Lungs: clear to auscultation bilaterally Heart: irregularly irregular rhythm Extremities: 3+ pitting edema Pulses: 2+ and symmetric Skin: Skin color, texture, turgor normal. No rashes or lesions Neurologic: Alert and oriented X 3, normal strength and tone. Normal symmetric reflexes. Normal coordination and gait  EKG atrial fibrillation with a ventricular spots of 132 and septal Q waves.  I personally reviewed this EKG.  ASSESSMENT AND PLAN:   Hyperlipidemia History of hyperlipidemia on statin therapy with lipid profile performed 07/04/2019 revealing total cholesterol 189, LDL of 89 HDL 78.  Essential hypertension, benign History of essential hypertension blood pressure measured today at 112/72.  She is on current carvedilol, Cardizem and losartan.  Atrial fibrillation ([place]) History of PAF on Eliquis oral anticoagulation status post permanent transvenous pacemaker placed by [name] 06/11/2019.  She was recently hospitalized several weeks ago for Covid pneumonia.  She is currently in A. fib with RVR with a heart rate in the 130 range.  I am going to increase her carvedilol from 12.5 to 18.75 mg p.o. twice daily and will have her see the A. fib clinic in the next several days.  She may need outpatient cardioversion.  Peripheral edema On furosemide 40 mg a day.  She has 3+ pitting edema peripherally.  Going to increase this to twice daily.  We will check a basic metabolic panel in 10 days.  I will see her back in 1 month.      Katherine Harp MD FACP,FACC,FAHA, [facility] 01/09/2020 3:45 PM

## 2020-01-09 NOTE — Assessment & Plan Note (Signed)
On furosemide 40 mg a day.  She has 3+ pitting edema peripherally.  Going to increase this to twice daily.  We will check a basic metabolic panel in 10 days.  I will see her back in 1 month.

## 2020-01-09 NOTE — Assessment & Plan Note (Signed)
History of hyperlipidemia on statin therapy with lipid profile performed 07/04/2019 revealing total cholesterol 189, LDL of 89 HDL 78.

## 2020-01-11 ENCOUNTER — Ambulatory Visit: Payer: PPO | Admitting: Cardiovascular Disease

## 2020-01-12 DIAGNOSIS — U071 COVID-19: Secondary | ICD-10-CM | POA: Diagnosis not present

## 2020-01-14 ENCOUNTER — Telehealth: Payer: Self-pay | Admitting: Family Medicine

## 2020-01-14 ENCOUNTER — Encounter (HOSPITAL_COMMUNITY): Payer: Self-pay | Admitting: Nurse Practitioner

## 2020-01-14 ENCOUNTER — Ambulatory Visit (HOSPITAL_BASED_OUTPATIENT_CLINIC_OR_DEPARTMENT_OTHER)
Admission: RE | Admit: 2020-01-14 | Discharge: 2020-01-14 | Disposition: A | Discharge status: Home / Self Care | Payer: PPO | Source: Ambulatory Visit | Attending: Nurse Practitioner | Admitting: Nurse Practitioner

## 2020-01-14 ENCOUNTER — Other Ambulatory Visit: Payer: Self-pay

## 2020-01-14 ENCOUNTER — Ambulatory Visit (HOSPITAL_COMMUNITY)
Admission: RE | Admit: 2020-01-14 | Discharge: 2020-01-14 | Disposition: A | Discharge status: Home / Self Care | Payer: PPO | Source: Ambulatory Visit | Attending: Nurse Practitioner | Admitting: Nurse Practitioner

## 2020-01-14 ENCOUNTER — Other Ambulatory Visit: Payer: Self-pay | Admitting: Family Medicine

## 2020-01-14 VITALS — BP 114/70 | HR 126 | Ht <= 58 in | Wt 185.8 lb

## 2020-01-14 DIAGNOSIS — R0902 Hypoxemia: Secondary | ICD-10-CM | POA: Diagnosis not present

## 2020-01-14 DIAGNOSIS — I130 Hypertensive heart and chronic kidney disease with heart failure and stage 1 through stage 4 chronic kidney disease, or unspecified chronic kidney disease: Secondary | ICD-10-CM | POA: Diagnosis not present

## 2020-01-14 DIAGNOSIS — J9621 Acute and chronic respiratory failure with hypoxia: Secondary | ICD-10-CM | POA: Diagnosis not present

## 2020-01-14 DIAGNOSIS — E039 Hypothyroidism, unspecified: Secondary | ICD-10-CM | POA: Diagnosis present

## 2020-01-14 DIAGNOSIS — J90 Pleural effusion, not elsewhere classified: Secondary | ICD-10-CM | POA: Diagnosis not present

## 2020-01-14 DIAGNOSIS — Z833 Family history of diabetes mellitus: Secondary | ICD-10-CM | POA: Insufficient documentation

## 2020-01-14 DIAGNOSIS — D6869 Other thrombophilia: Secondary | ICD-10-CM

## 2020-01-14 DIAGNOSIS — N1832 Chronic kidney disease, stage 3b: Secondary | ICD-10-CM | POA: Diagnosis present

## 2020-01-14 DIAGNOSIS — N179 Acute kidney failure, unspecified: Secondary | ICD-10-CM | POA: Diagnosis not present

## 2020-01-14 DIAGNOSIS — R05 Cough: Secondary | ICD-10-CM | POA: Diagnosis not present

## 2020-01-14 DIAGNOSIS — J9611 Chronic respiratory failure with hypoxia: Secondary | ICD-10-CM | POA: Diagnosis not present

## 2020-01-14 DIAGNOSIS — J9601 Acute respiratory failure with hypoxia: Secondary | ICD-10-CM | POA: Diagnosis not present

## 2020-01-14 DIAGNOSIS — I4819 Other persistent atrial fibrillation: Secondary | ICD-10-CM

## 2020-01-14 DIAGNOSIS — Z888 Allergy status to other drugs, medicaments and biological substances status: Secondary | ICD-10-CM | POA: Insufficient documentation

## 2020-01-14 DIAGNOSIS — R0600 Dyspnea, unspecified: Secondary | ICD-10-CM | POA: Diagnosis not present

## 2020-01-14 DIAGNOSIS — E8770 Fluid overload, unspecified: Secondary | ICD-10-CM | POA: Diagnosis not present

## 2020-01-14 DIAGNOSIS — R531 Weakness: Secondary | ICD-10-CM | POA: Diagnosis not present

## 2020-01-14 DIAGNOSIS — R110 Nausea: Secondary | ICD-10-CM | POA: Diagnosis not present

## 2020-01-14 DIAGNOSIS — D631 Anemia in chronic kidney disease: Secondary | ICD-10-CM | POA: Diagnosis not present

## 2020-01-14 DIAGNOSIS — I361 Nonrheumatic tricuspid (valve) insufficiency: Secondary | ICD-10-CM | POA: Diagnosis not present

## 2020-01-14 DIAGNOSIS — Z8619 Personal history of other infectious and parasitic diseases: Secondary | ICD-10-CM | POA: Insufficient documentation

## 2020-01-14 DIAGNOSIS — J189 Pneumonia, unspecified organism: Secondary | ICD-10-CM | POA: Diagnosis not present

## 2020-01-14 DIAGNOSIS — I129 Hypertensive chronic kidney disease with stage 1 through stage 4 chronic kidney disease, or unspecified chronic kidney disease: Secondary | ICD-10-CM | POA: Diagnosis not present

## 2020-01-14 DIAGNOSIS — E1129 Type 2 diabetes mellitus with other diabetic kidney complication: Secondary | ICD-10-CM | POA: Diagnosis not present

## 2020-01-14 DIAGNOSIS — R197 Diarrhea, unspecified: Secondary | ICD-10-CM | POA: Diagnosis not present

## 2020-01-14 DIAGNOSIS — E785 Hyperlipidemia, unspecified: Secondary | ICD-10-CM | POA: Diagnosis present

## 2020-01-14 DIAGNOSIS — R5381 Other malaise: Secondary | ICD-10-CM | POA: Diagnosis not present

## 2020-01-14 DIAGNOSIS — D84821 Immunodeficiency due to drugs: Secondary | ICD-10-CM | POA: Diagnosis not present

## 2020-01-14 DIAGNOSIS — Z79890 Hormone replacement therapy (postmenopausal): Secondary | ICD-10-CM | POA: Diagnosis not present

## 2020-01-14 DIAGNOSIS — E8809 Other disorders of plasma-protein metabolism, not elsewhere classified: Secondary | ICD-10-CM | POA: Diagnosis present

## 2020-01-14 DIAGNOSIS — R5383 Other fatigue: Secondary | ICD-10-CM

## 2020-01-14 DIAGNOSIS — R0602 Shortness of breath: Secondary | ICD-10-CM | POA: Diagnosis present

## 2020-01-14 DIAGNOSIS — I4891 Unspecified atrial fibrillation: Secondary | ICD-10-CM | POA: Diagnosis not present

## 2020-01-14 DIAGNOSIS — R059 Cough, unspecified: Secondary | ICD-10-CM

## 2020-01-14 DIAGNOSIS — I5033 Acute on chronic diastolic (congestive) heart failure: Secondary | ICD-10-CM | POA: Diagnosis not present

## 2020-01-14 DIAGNOSIS — J918 Pleural effusion in other conditions classified elsewhere: Secondary | ICD-10-CM | POA: Diagnosis not present

## 2020-01-14 DIAGNOSIS — Z950 Presence of cardiac pacemaker: Secondary | ICD-10-CM | POA: Diagnosis not present

## 2020-01-14 DIAGNOSIS — Z8616 Personal history of COVID-19: Secondary | ICD-10-CM | POA: Diagnosis not present

## 2020-01-14 DIAGNOSIS — E1122 Type 2 diabetes mellitus with diabetic chronic kidney disease: Secondary | ICD-10-CM | POA: Diagnosis present

## 2020-01-14 DIAGNOSIS — Z7901 Long term (current) use of anticoagulants: Secondary | ICD-10-CM | POA: Diagnosis not present

## 2020-01-14 DIAGNOSIS — Z9981 Dependence on supplemental oxygen: Secondary | ICD-10-CM | POA: Diagnosis not present

## 2020-01-14 DIAGNOSIS — I10 Essential (primary) hypertension: Secondary | ICD-10-CM | POA: Insufficient documentation

## 2020-01-14 DIAGNOSIS — N189 Chronic kidney disease, unspecified: Secondary | ICD-10-CM | POA: Diagnosis not present

## 2020-01-14 DIAGNOSIS — Z79899 Other long term (current) drug therapy: Secondary | ICD-10-CM | POA: Insufficient documentation

## 2020-01-14 DIAGNOSIS — J948 Other specified pleural conditions: Secondary | ICD-10-CM | POA: Diagnosis not present

## 2020-01-14 DIAGNOSIS — Z87891 Personal history of nicotine dependence: Secondary | ICD-10-CM | POA: Insufficient documentation

## 2020-01-14 DIAGNOSIS — Z20822 Contact with and (suspected) exposure to covid-19: Secondary | ICD-10-CM | POA: Diagnosis not present

## 2020-01-14 DIAGNOSIS — I495 Sick sinus syndrome: Secondary | ICD-10-CM | POA: Diagnosis present

## 2020-01-14 DIAGNOSIS — Z7984 Long term (current) use of oral hypoglycemic drugs: Secondary | ICD-10-CM | POA: Insufficient documentation

## 2020-01-14 DIAGNOSIS — R946 Abnormal results of thyroid function studies: Secondary | ICD-10-CM

## 2020-01-14 DIAGNOSIS — Z8249 Family history of ischemic heart disease and other diseases of the circulatory system: Secondary | ICD-10-CM | POA: Insufficient documentation

## 2020-01-14 DIAGNOSIS — E119 Type 2 diabetes mellitus without complications: Secondary | ICD-10-CM | POA: Insufficient documentation

## 2020-01-14 DIAGNOSIS — R519 Headache, unspecified: Secondary | ICD-10-CM | POA: Diagnosis not present

## 2020-01-14 DIAGNOSIS — K219 Gastro-esophageal reflux disease without esophagitis: Secondary | ICD-10-CM | POA: Diagnosis present

## 2020-01-14 DIAGNOSIS — I351 Nonrheumatic aortic (valve) insufficiency: Secondary | ICD-10-CM | POA: Diagnosis not present

## 2020-01-14 DIAGNOSIS — E1165 Type 2 diabetes mellitus with hyperglycemia: Secondary | ICD-10-CM | POA: Diagnosis not present

## 2020-01-14 DIAGNOSIS — K760 Fatty (change of) liver, not elsewhere classified: Secondary | ICD-10-CM | POA: Diagnosis present

## 2020-01-14 DIAGNOSIS — M0579 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 without organ or systems involvement: Secondary | ICD-10-CM | POA: Diagnosis present

## 2020-01-14 LAB — BASIC METABOLIC PANEL
Anion gap: 15 (ref 5–15)
BUN: 22 mg/dL (ref 8–23)
CO2: 27 mmol/L (ref 22–32)
Calcium: 8.1 mg/dL — ABNORMAL LOW (ref 8.9–10.3)
Chloride: 97 mmol/L — ABNORMAL LOW (ref 98–111)
Creatinine, Ser: 1.4 mg/dL — ABNORMAL HIGH (ref 0.44–1.00)
GFR calc Af Amer: 42 mL/min — ABNORMAL LOW (ref >60)
GFR calc non Af Amer: 37 mL/min — ABNORMAL LOW (ref >60)
Glucose, Bld: 143 mg/dL — ABNORMAL HIGH (ref 70–99)
Potassium: 4.6 mmol/L (ref 3.5–5.1)
Sodium: 139 mmol/L (ref 135–145)

## 2020-01-14 MED ORDER — LEVOFLOXACIN 500 MG PO TABS: 500.0000 mg | ORAL_TABLET | Freq: Every day | ORAL | 0 refills | Status: DC

## 2020-01-14 NOTE — Progress Notes (Addendum)
Primary Care Physician: Alycia Rossetti, MD Referring Physician: Dr. Mardee [name] is a 74 y.o. female with a h/o  covid illness early April, PAF, HTN that is in the afib clinic after Dr. [name] found her in afib with  RVR on 5/19. SHe also had increased LLE . He increased her BB and diuretic and referred her here to be considered for cardioversion.   The pt states that her LLE is somewhat improved but she is still fairly  edematous. She remains in Afib with rvr today, despite increase in BB. . She states that she has felt very weak and short  of breath over the last 2 weeks. Her cough has increased and become almost constant. She is wearing O2 via [place] 24/7. She was suppose to have a f/u CXR but failed to do so. She saw her PCP 2 weeks ago but her cough was not as bad as recent. She is on eliquis 5 mg bid for a CHA2DS2VASc score of 5.    Today, she denies symptoms of palpitations, chest pain, shortness of breath, orthopnea, PND, lower extremity edema, dizziness, presyncope, syncope, or neurologic sequela. The patient is tolerating medications without difficulties and is otherwise without complaint today.   Past Medical History:  Diagnosis Date  . Abrasion of skin with infection 05/09/2013  . Acute blood loss anemia 08/05/2012  . Anemia 11/21/2013  . Arthritis    osteoarthritis  . Atrial fibrillation (HCC)    Paroxysmal atrial fibrillation  . Bursitis of right shoulder   . Chronic cholecystitis with calculus 05/04/2013  . 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 Diabetes type 2, controlled ([place]) 05/04/2013  . Essential hypertension, benign 10/14/2012   Essential hypertension  . Fatty liver disease, nonalcoholic [id number]   hospitalized, MRCP dx fatty liver  . Gastroenteritis 10/16/2013  . GERD (gastroesophageal reflux disease)   . History of hypertension 08/20/2016  . History of total bilateral knee replacement 06/22/2017   Right= 2013. Left=2003  .  Hormone replacement therapy (postmenopausal)   . 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 Hyperlipidemia   . Hypertension   . Hypertensive urgency 07/11/2018  . Hypokalemia 07/11/2018  . Hypomagnesemia 07/21/2016  . Hypothyroidism 03/28/2019  . Malnutrition of moderate degree ([place]) 03/18/2014  . Morbid obesity due to excess calories ([place]) 12/27/2012   Body mass index is 37.16 kg/m.  -  trending up  Lab Results Component Value Date  TSH 1.520 10/15/2013    Contributing to gerd risk/ doe/reviewed the need and the process to achieve and maintain neg calorie balance > defer f/u primary care including intermittently monitoring thyroid status    . Multiple pulmonary nodules 04/04/2014   Followed in Pulmonary clinic/ [facility]/ [place] - See CT chest 03/17/14 > not viz on abd CT from 10/16/13 as did not go high enough on lung slices >>>in tickle file for recall 06/17/14  - Spirometry 04/04/14 wnl sp smoking cessation 02/2014 - 06/26/2014 Right middle lobe 1.0 cm pulmonary nodule is similar over the past 3 months. This remains indeterminate.   - CT 12/25/14  Pulmonary nodules are st  . Nausea & vomiting 07/11/2018  . Normal cardiac stress test    pt can't remember when or where  . Obesity, unspecified 12/27/2012  . Osteoarthritis of right knee 08/05/2012  . 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 without organ or systems involvement ([place])   . Rheumatoid arthritis(714.0) 09/28/2012  .  Sepsis ([place]) 03/16/2014  . Sinus pause 05/17/2019   Sinus pauses  . SIRS (systemic inflammatory response syndrome) ([place]) 07/11/2018  . Syncope and collapse 03/03/2019  . 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 Thyroid nodule 03/05/2016   See CT chest 02/25/16 - u/s thyroid 03/04/16 > Pos L nodules > both Bx [id number] > BENIGN FOLLICULAR NODULE  . Unspecified essential hypertension 05/04/2013   Past Surgical History:  Procedure Laterality Date  . ABDOMINAL HYSTERECTOMY    . CHOLECYSTECTOMY N/A 05/04/2013    Procedure: LAPAROSCOPIC CHOLECYSTECTOMY WITH INTRAOPERATIVE CHOLANGIOGRAM;  Surgeon: Haywood Lasso, MD;  Location: [facility];  Service: General;  Laterality: N/A;  . ERCP N/A 05/02/2013   Procedure: ENDOSCOPIC RETROGRADE CHOLANGIOPANCREATOGRAPHY (ERCP);  Surgeon: Beryle Beams, MD;  Location: [facility];  Service: Endoscopy;  Laterality: N/A;  . JOINT REPLACEMENT     left knee  . NASAL SINUS SURGERY    . PACEMAKER IMPLANT N/A 06/11/2019   Procedure: PACEMAKER IMPLANT;  Surgeon: Deboraha Sprang, MD;  Location: [facility];  Service: Cardiovascular;  Laterality: N/A;  . SPHINCTEROTOMY  05/02/2013   Procedure: SPHINCTEROTOMY;  Surgeon: Beryle Beams, MD;  Location: [facility];  Service: Endoscopy;;  . TONSILLECTOMY    . TOTAL KNEE ARTHROPLASTY  08/01/2012   Procedure: TOTAL KNEE ARTHROPLASTY;  Surgeon: Meredith Pel, MD;  Location: [facility];  Service: Orthopedics;  Laterality: Right;  Right total knee arthroplasty  . TOTAL KNEE ARTHROPLASTY  08/01/2012   RIGHT  KNEE    Current Outpatient Medications  Medication Sig Dispense Refill  . albuterol (VENTOLIN HFA) 108 (90 Base) MCG/ACT inhaler Inhale 2 puffs into the lungs every 4 (four) hours as needed for wheezing or shortness of breath. 6.7 g 0  . atorvastatin (LIPITOR) 10 MG tablet TAKE 1 TABLET BY MOUTH EVERY DAY (Patient taking differently: Take 10 mg by mouth daily. ) 90 tablet 3  . benzonatate (TESSALON) 200 MG capsule Take 1 capsule (200 mg total) by mouth 3 (three) times daily. 20 capsule 0  . Calcium Carbonate-Vitamin D (CALCIUM 600 + D PO) Take 1 tablet by mouth 2 (two) times daily.    . carvedilol (COREG) 12.5 MG tablet Take 1.5 tablets (18.75 mg total) by mouth 2 (two) times daily with a meal. 180 tablet 2  . diclofenac sodium (VOLTAREN) 1 % GEL Apply 3 g topically daily as needed (to painful sites (in 3 large joints)).     Marland Kitchen diltiazem (CARDIZEM CD) 360 MG 24 hr capsule TAKE 1 CAPSULE BY MOUTH EVERY DAY (Patient taking  differently: Take 360 mg by mouth daily. ) 90 capsule 1  . ELIQUIS 5 MG TABS tablet TAKE 1 TABLET BY MOUTH TWICE A DAY (Patient taking differently: Take 5 mg by mouth 2 (two) times daily. ) 180 tablet 1  . estradiol (ESTRACE) 0.5 MG tablet Take 0.5 mg by mouth 2 (two) times a day.    . etanercept (ENBREL SURECLICK) 50 MG/ML injection INJECT 50 MG (0.98 ML) UNDER THE SKIN ONCE A WEEK (Patient taking differently: Inject 50 mg into the skin once a week. Thursdays (0.98 ML)) 12 pen 0  . fish oil-omega-3 fatty acids 1000 MG capsule Take 1 g by mouth at bedtime.     . folic acid (FOLVITE) 1 MG tablet TAKE 1 TABLET BY MOUTH TWICE A DAY (Patient taking differently: Take 1 mg by mouth 2 (two) times daily. ) 180 tablet 1  . furosemide (LASIX) 40 MG tablet Take 1 tablet (  40 mg total) by mouth 2 (two) times daily. 180 tablet 2  . leflunomide (ARAVA) 10 MG tablet TAKE 1 TABLET BY MOUTH EVERY DAY (Patient taking differently: Take 10 mg by mouth daily. ) 90 tablet 0  . losartan (COZAAR) 50 MG tablet Take 1 tablet (50 mg total) by mouth daily. 90 tablet 3  . metFORMIN (GLUCOPHAGE) 500 MG tablet Take 2 tablets (1,000 mg total) by mouth 2 (two) times daily with a meal. 120 tablet 3  . methocarbamol (ROBAXIN) 500 MG tablet Take 1 tablet (500 mg total) by mouth daily as needed for muscle spasms. (Patient taking differently: Take 500 mg by mouth as needed for muscle spasms. ) 30 tablet 0  . Multiple Vitamins-Minerals (MULTIVITAMINS THER. W/MINERALS) TABS Take 1 tablet by mouth daily.    . pantoprazole (PROTONIX) 40 MG tablet TAKE 1 TABLET BY MOUTH EVERY DAY (Patient taking differently: Take 40 mg by mouth daily. ) 90 tablet 1  . potassium chloride (KLOR-CON) 10 MEQ tablet Take 1 tablet (10 mEq total) by mouth daily. 90 tablet 3  . vitamin B-12 (CYANOCOBALAMIN) 500 MCG tablet Take 500 mcg by mouth daily.    Marland Kitchen levothyroxine (SYNTHROID) 50 MCG tablet TAKE 1 TABLET BY MOUTH DAILY BEFORE BREAKFAST 90 tablet 0  . magnesium  oxide (MAG-OX) 400 MG tablet Take 1,200 mg by mouth 2 (two) times daily.      No current facility-administered medications for this encounter.    Allergies  Allergen Reactions  . Monopril [Fosinopril] Cough  . Magnesium-Containing Compounds Nausea Only and Other (See Comments)    Re: Mag-Ox at higher doses caused a lot of stomach upset (dose had to be decreased)  . Norvasc [Amlodipine Besylate] Other (See Comments)    10 mg dose caused LE edema- can tolerate 5 mg dose    Social History   Socioeconomic History  . Marital status: Divorced    Spouse name: Not on file  . Number of children: Not on file  . Years of education: Not on file  . Highest education level: Not on file  Occupational History  . Not on file  Tobacco Use  . Smoking status: Former Smoker    Packs/day: 0.25    Years: 2.00    Pack years: 0.50    Types: Cigarettes  . Smokeless tobacco: Never Used  Substance and Sexual Activity  . Alcohol use: Yes    Comment: occasional  . Drug use: Never  . Sexual activity: Never  Other Topics Concern  . Not on file  Social History Narrative  . Not on file   Social Determinants of Health   Financial Resource Strain:   . Difficulty of Paying Living Expenses:   Food Insecurity:   . Worried About Charity fundraiser in the Last Year:   . Arboriculturist in the Last Year:   Transportation Needs:   . Film/video editor (Medical):   Marland Kitchen Lack of Transportation (Non-Medical):   Physical Activity:   . Days of Exercise per Week:   . Minutes of Exercise per Session:   Stress:   . Feeling of Stress :   Social Connections:   . Frequency of Communication with Friends and Family:   . Frequency of Social Gatherings with Friends and Family:   . Attends Religious Services:   . Active Member of Clubs or Organizations:   . Attends Archivist Meetings:   Marland Kitchen Marital Status:   Intimate Partner Violence:   .  Fear of Current or Ex-Partner:   . Emotionally Abused:   Marland Kitchen  Physically Abused:   . Sexually Abused:     Family History  Problem Relation Age of Onset  . Cancer Mother        breast  . Heart attack Mother   . Stroke Father   . Diabetes Sister   . Diabetes Daughter     ROS- All systems are reviewed and negative except as per the HPI above  Physical Exam: Vitals:   01/14/20 0921  BP: 114/70  Pulse: (!) 126  Weight: 84.3 kg  Height: 4\' 10"  (1.473 m)   Wt Readings from Last 3 Encounters:  01/14/20 84.3 kg  01/09/20 84.6 kg  12/25/19 84.8 kg    Labs: Lab Results  Component Value Date   NA 139 01/14/2020   K 4.6 01/14/2020   CL 97 (L) 01/14/2020   CO2 27 01/14/2020   GLUCOSE 143 (H) 01/14/2020   BUN 22 01/14/2020   CREATININE 1.40 (H) 01/14/2020   CALCIUM 8.1 (L) 01/14/2020   PHOS 2.7 12/05/2019   MG 2.2 12/06/2019   Lab Results  Component Value Date   INR 1.2 03/03/2019   Lab Results  Component Value Date   CHOL 189 07/04/2019   HDL 78 07/04/2019   LDLCALC 89 07/04/2019   TRIG 127 07/04/2019     GEN- The patient is well appearing, alert and oriented x 3 today.   Head- normocephalic, atraumatic Eyes-  Sclera clear, conjunctiva pink Ears- hearing intact Oropharynx- clear Neck- supple, no JVP Lymph- no cervical lymphadenopathy Lungs- Clear to ausculation bilaterally, rhonchi hear left base, decreased breath sounds, rt base to mid lung  Heart- Regular rate and rhythm, no murmurs, rubs or gallops, PMI not laterally displaced GI- soft, NT, ND, + BS Extremities- no clubbing, cyanosis, or edema MS- no significant deformity or atrophy Skin- no rash or lesion Psych- euthymic mood, full affect Neuro- strength and sensation are intact  EKG-afib at 126 bpm, qrs int 72 bpm, qtc 425 ms    Assessment and Plan: 1. Persistent  afib  I fear that pt has not recovered from  Covid with her persistent congested cough and this is aggravating her afib  I am going to order a CXR today  If this shows pneumonia, cardioversion  will be on hold until this is resolved I ordered a CBC/bmet but the CBC did not have enough blood to run the test in the lab  Update- 2pm- CXR did show a mod to  large pleural effusion on the left and consolidation and  possible multilobar pneumonia on the right. I talked to Dr. Dorian Heckle nurse and she asked for CXR to be forwarded to Dr. Buelah Manis and she will determine treatment.    Once the acute infectious process is resolved will be glad to see pt back to set up  for cardioversion. She had missed a couple of doses of eliquis  last week so would have required a TEE guided cardioversion.   I notified pt that Dr. Buelah Manis will be in touch regarding next steps.   Geroge Baseman Mayjor Ager, [facility] [address] [place], [place] [address] [phone]

## 2020-01-14 NOTE — Telephone Encounter (Signed)
Call pt Cardiologist nurse practitioner that she saw today sent her for chest x-ray secondary to shortness of breath and cough.  She has pneumonia but also a very large pleural effusion which can occur with infections. I want her To start her on Levaquin 500 mg once a day to cover for pneumonia however if her breathing worsens she needs to go to the emergency room tonight because of fluid in her chest.  If she is able to make it till tomorrow the left her to be evaluated in the office

## 2020-01-14 NOTE — Telephone Encounter (Signed)
Call placed to patient and patient made aware.  Patient noted to be speaking in full sentences with no DOE noted.   Appointment scheduled.

## 2020-01-14 NOTE — Patient Instructions (Signed)
Cardioversion scheduled for   - Arrive at the [facility] and go to admitting at  - Do not eat or drink anything after midnight the night prior to your procedure.  - Take all your morning medication (except diabetic medications) with a sip of water prior to arrival.  - You will not be able to drive home after your procedure.  - Do NOT miss any doses of your blood thinner - if you should miss a dose please notify our office immediately.

## 2020-01-15 ENCOUNTER — Ambulatory Visit (INDEPENDENT_AMBULATORY_CARE_PROVIDER_SITE_OTHER): Payer: PPO | Admitting: Family Medicine

## 2020-01-15 ENCOUNTER — Encounter: Payer: Self-pay | Admitting: Family Medicine

## 2020-01-15 ENCOUNTER — Other Ambulatory Visit: Payer: Self-pay | Admitting: Family Medicine

## 2020-01-15 VITALS — BP 116/88 | HR 100 | Temp 98.1°F | Resp 18 | Ht <= 58 in | Wt 186.0 lb

## 2020-01-15 DIAGNOSIS — J189 Pneumonia, unspecified organism: Secondary | ICD-10-CM | POA: Diagnosis not present

## 2020-01-15 DIAGNOSIS — I4819 Other persistent atrial fibrillation: Secondary | ICD-10-CM

## 2020-01-15 DIAGNOSIS — N179 Acute kidney failure, unspecified: Secondary | ICD-10-CM

## 2020-01-15 DIAGNOSIS — J9611 Chronic respiratory failure with hypoxia: Secondary | ICD-10-CM

## 2020-01-15 DIAGNOSIS — J90 Pleural effusion, not elsewhere classified: Secondary | ICD-10-CM

## 2020-01-15 MED ORDER — HYDROCOD POLST-CPM POLST ER 10-8 MG/5ML PO SUER: 5.0000 mL | Freq: Two times a day (BID) | ORAL | 0 refills | Status: AC | PRN

## 2020-01-15 MED ORDER — BENZONATATE 200 MG PO CAPS: 200.0000 mg | ORAL_CAPSULE | Freq: Three times a day (TID) | ORAL | 0 refills | Status: AC

## 2020-01-15 MED ORDER — ONDANSETRON HCL 4 MG PO TABS: 4.0000 mg | ORAL_TABLET | Freq: Three times a day (TID) | ORAL | 0 refills | Status: AC | PRN

## 2020-01-15 NOTE — Patient Instructions (Signed)
Take antibiotics  Use albuterol as needed  Cough medicine refilled If not improved over next 1-2 days needs to go to hospital  We will call to check on you

## 2020-01-15 NOTE — Assessment & Plan Note (Addendum)
Chest x-ray impression shows multi lobar pneumonia on the right side and left pleural effusion. Differentials for the pleural effusion include the pneumonia itself versus atrial fibrillation with heart failure.  Post Covid infection.   She recently had her diuretics increased by cardiology to help with fluid overload and possibly this may help with the effusion..  I started her on Levaquin 500 mg yesterday.  I will add Tussionex cough medicine she will continue her albuterol.  We did discuss getting a nebulizer but she feels she is doing fine with the inhaler.  She has not had any fever or chills.  She does have acute renal failure in the setting of her diuretics and current illness.  SHe wants to try treating herself at home before returning to the hospital.  She has oxygen therapy and O2 sat look good on 2 L of oxygen.  At this time I am willing to give her another 1 to 2 days to see if she starts to improve with regards to her breathing.  Her niece who is one of her caregivers was here in the office.  We discussed signs of decline at home and need for ER evaluation.  We will also follow-up by phone with her.

## 2020-01-15 NOTE — Assessment & Plan Note (Signed)
Cardioversion on hold until infection and breathing improved Continue lasix BID

## 2020-01-15 NOTE — Assessment & Plan Note (Signed)
Continue oxygen therapy.

## 2020-01-15 NOTE — Progress Notes (Signed)
Subjective:    Patient ID: [name], female    DOB: 1946-05-28, 74 y.o.   MRN: [id number]  Patient presents for PNA  Here to follow-up pneumonia and abnormal chest xray.  She was seen by her cardiologist yesterday as they were preparing her for cardioversion in the setting of her atrial fibrillation.  She continues to have episodes of palpitations along with fatigue.  She was noted to have little more difficulty breathing and complained that she had worsening cough since her Covid hospitalization back in April so she was sent for CXR and they called me to manage results.    She is still wearing 2 L of oxygen.  When she gets up to exert herself she has significant difficulty.  The coughing fits of the worst and she does not have any more cough medicine which actually helped keep it at [place].  She also uses her albuterol every 4 hours as needed occasionally has these in the middle the night.  She was noted to be more fluid overloaded last week by her cardiologist to increase her Lasix to 40 mg twice a day states that she has been doing this For the past 4 5 days.  She has noted some increase in her urine output.  She denies any chest pain denies any fever or chills.  She is here today with her granddaughter who has been helping care for her.  Her appetite has been fair definitely decreased more with the coughing fits.  Her bowel movements are normal.  SHe is taking all her medications as prescribed.  Reviewed her recent CBC and metabolic obtained by cardiology.    CXR IMPRESSION: 01/14/20  1. Interval development of moderate to large left pleural effusion with atelectasis and/or consolidation in the left lung base. 2. Patchy areas of airspace consolidation throughout the right mid to lower lung concerning for multilobar pneumonia. 3. Aortic atherosclerosis.  Review Of Systems:  GEN- +fatigue, fever, weight loss,weakness, recent illness HEENT- denies eye drainage, change in vision, nasal  discharge, CVS- denies chest pain, palpitations RESP- + SOB, +cough, wheeze ABD- denies N/V, change in stools, abd pain GU- denies dysuria, hematuria, dribbling, incontinence MSK- denies joint pain, muscle aches, injury Neuro- denies headache, dizziness, syncope, seizure activity       Objective:    BP 116/88   Pulse 100   Temp 98.1 F (36.7 C) (Temporal)   Resp 18   Ht 4\' 10"  (1.473 m)   Wt 186 lb (84.4 kg)   SpO2 95% Comment: 2L/min via [place]  BMI 38.87 kg/m  GEN- NAD, alert and oriented x3 HEENT- PERRL, EOMI, non injected sclera, pink conjunctiva, MMM, oropharynx clear Neck- Supple, no thyromegaly CVS- irregular rhythem, HR 90-100 at rest , no murmur RESP-dECREASED BS left side, crackles heard, coughing fits, between speaking.  ABD-NABS,soft,NT,ND EXT- 1+ pitting edema bilat LE  Pulses- Radial,2+        Assessment & Plan:      Problem List Items Addressed This Visit      Unprioritized   Atrial fibrillation ([place])    Cardioversion on hold until infection and breathing improved Continue lasix BID      Chronic respiratory failure ([place])    Continue oxygen therapy      Community acquired pneumonia of left lower lobe of lung - Primary    Chest x-ray impression shows multi lobar pneumonia on the right side and left pleural effusion. Differentials for the pleural effusion include the pneumonia itself versus  atrial fibrillation with heart failure.  Post Covid infection.   She recently had her diuretics increased by cardiology to help with fluid overload and possibly this may help with the effusion..  I started her on Levaquin 500 mg yesterday.  I will add Tussionex cough medicine she will continue her albuterol.  We did discuss getting a nebulizer but she feels she is doing fine with the inhaler.  She has not had any fever or chills.  She does have acute renal failure in the setting of her diuretics and current illness.  SHe wants to try treating herself at home before  returning to the hospital.  She has oxygen therapy and O2 sat look good on 2 L of oxygen.  At this time I am willing to give her another 1 to 2 days to see if she starts to improve with regards to her breathing.  Her niece who is one of her caregivers was here in the office.  We discussed signs of decline at home and need for ER evaluation.  We will also follow-up by phone with her.           Relevant Medications   benzonatate (TESSALON) 200 MG capsule   chlorpheniramine-HYDROcodone (TUSSIONEX PENNKINETIC ER) 10-8 MG/5ML SUER    Other Visit Diagnoses    Pleural effusion on left       Acute renal failure, unspecified acute renal failure type ([place])       advised to hydrate along wtih the higher dose of lasix       Note: This dictation was prepared with Dragon dictation along with smaller phrase technology. Any transcriptional errors that result from this process are unintentional.

## 2020-01-16 ENCOUNTER — Telehealth: Payer: Self-pay | Admitting: Family Medicine

## 2020-01-16 ENCOUNTER — Encounter (HOSPITAL_COMMUNITY): Payer: Self-pay | Admitting: *Deleted

## 2020-01-16 ENCOUNTER — Other Ambulatory Visit: Payer: Self-pay

## 2020-01-16 ENCOUNTER — Inpatient Hospital Stay (HOSPITAL_COMMUNITY)
Admission: EM | Admit: 2020-01-16 | Discharge: 2020-01-26 | DRG: 291 | Disposition: A | Discharge status: Home Health | Payer: PPO | Attending: Internal Medicine | Admitting: Internal Medicine

## 2020-01-16 ENCOUNTER — Emergency Department (HOSPITAL_COMMUNITY): Payer: PPO

## 2020-01-16 DIAGNOSIS — Z888 Allergy status to other drugs, medicaments and biological substances status: Secondary | ICD-10-CM

## 2020-01-16 DIAGNOSIS — G2581 Restless legs syndrome: Secondary | ICD-10-CM | POA: Diagnosis present

## 2020-01-16 DIAGNOSIS — J90 Pleural effusion, not elsewhere classified: Secondary | ICD-10-CM

## 2020-01-16 DIAGNOSIS — I959 Hypotension, unspecified: Secondary | ICD-10-CM | POA: Diagnosis present

## 2020-01-16 DIAGNOSIS — K219 Gastro-esophageal reflux disease without esophagitis: Secondary | ICD-10-CM | POA: Diagnosis present

## 2020-01-16 DIAGNOSIS — Z79899 Other long term (current) drug therapy: Secondary | ICD-10-CM

## 2020-01-16 DIAGNOSIS — N1832 Chronic kidney disease, stage 3b: Secondary | ICD-10-CM | POA: Diagnosis present

## 2020-01-16 DIAGNOSIS — J9601 Acute respiratory failure with hypoxia: Secondary | ICD-10-CM | POA: Diagnosis present

## 2020-01-16 DIAGNOSIS — D631 Anemia in chronic kidney disease: Secondary | ICD-10-CM | POA: Diagnosis present

## 2020-01-16 DIAGNOSIS — K760 Fatty (change of) liver, not elsewhere classified: Secondary | ICD-10-CM | POA: Diagnosis present

## 2020-01-16 DIAGNOSIS — M0579 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 without organ or systems involvement: Secondary | ICD-10-CM | POA: Diagnosis present

## 2020-01-16 DIAGNOSIS — J189 Pneumonia, unspecified organism: Secondary | ICD-10-CM

## 2020-01-16 DIAGNOSIS — Z833 Family history of diabetes mellitus: Secondary | ICD-10-CM

## 2020-01-16 DIAGNOSIS — E8809 Other disorders of plasma-protein metabolism, not elsewhere classified: Secondary | ICD-10-CM | POA: Diagnosis present

## 2020-01-16 DIAGNOSIS — E876 Hypokalemia: Secondary | ICD-10-CM | POA: Diagnosis not present

## 2020-01-16 DIAGNOSIS — Z803 Family history of malignant neoplasm of breast: Secondary | ICD-10-CM

## 2020-01-16 DIAGNOSIS — Z20822 Contact with and (suspected) exposure to covid-19: Secondary | ICD-10-CM | POA: Diagnosis present

## 2020-01-16 DIAGNOSIS — Z7984 Long term (current) use of oral hypoglycemic drugs: Secondary | ICD-10-CM

## 2020-01-16 DIAGNOSIS — E1122 Type 2 diabetes mellitus with diabetic chronic kidney disease: Secondary | ICD-10-CM | POA: Diagnosis present

## 2020-01-16 DIAGNOSIS — I495 Sick sinus syndrome: Secondary | ICD-10-CM | POA: Diagnosis present

## 2020-01-16 DIAGNOSIS — Z8616 Personal history of COVID-19: Secondary | ICD-10-CM

## 2020-01-16 DIAGNOSIS — Z950 Presence of cardiac pacemaker: Secondary | ICD-10-CM

## 2020-01-16 DIAGNOSIS — N179 Acute kidney failure, unspecified: Secondary | ICD-10-CM

## 2020-01-16 DIAGNOSIS — J918 Pleural effusion in other conditions classified elsewhere: Secondary | ICD-10-CM | POA: Diagnosis present

## 2020-01-16 DIAGNOSIS — D84821 Immunodeficiency due to drugs: Secondary | ICD-10-CM | POA: Diagnosis present

## 2020-01-16 DIAGNOSIS — Z87891 Personal history of nicotine dependence: Secondary | ICD-10-CM

## 2020-01-16 DIAGNOSIS — I10 Essential (primary) hypertension: Secondary | ICD-10-CM | POA: Diagnosis present

## 2020-01-16 DIAGNOSIS — I4891 Unspecified atrial fibrillation: Secondary | ICD-10-CM | POA: Diagnosis present

## 2020-01-16 DIAGNOSIS — Z96653 Presence of artificial knee joint, bilateral: Secondary | ICD-10-CM | POA: Diagnosis present

## 2020-01-16 DIAGNOSIS — I130 Hypertensive heart and chronic kidney disease with heart failure and stage 1 through stage 4 chronic kidney disease, or unspecified chronic kidney disease: Principal | ICD-10-CM | POA: Diagnosis present

## 2020-01-16 DIAGNOSIS — Z79890 Hormone replacement therapy (postmenopausal): Secondary | ICD-10-CM

## 2020-01-16 DIAGNOSIS — I5033 Acute on chronic diastolic (congestive) heart failure: Secondary | ICD-10-CM | POA: Diagnosis present

## 2020-01-16 DIAGNOSIS — Z9981 Dependence on supplemental oxygen: Secondary | ICD-10-CM

## 2020-01-16 DIAGNOSIS — Z823 Family history of stroke: Secondary | ICD-10-CM

## 2020-01-16 DIAGNOSIS — Z8249 Family history of ischemic heart disease and other diseases of the circulatory system: Secondary | ICD-10-CM

## 2020-01-16 DIAGNOSIS — Z7901 Long term (current) use of anticoagulants: Secondary | ICD-10-CM

## 2020-01-16 DIAGNOSIS — I4819 Other persistent atrial fibrillation: Secondary | ICD-10-CM | POA: Diagnosis present

## 2020-01-16 DIAGNOSIS — E1165 Type 2 diabetes mellitus with hyperglycemia: Secondary | ICD-10-CM | POA: Diagnosis not present

## 2020-01-16 DIAGNOSIS — E039 Hypothyroidism, unspecified: Secondary | ICD-10-CM | POA: Diagnosis present

## 2020-01-16 DIAGNOSIS — J9621 Acute and chronic respiratory failure with hypoxia: Secondary | ICD-10-CM | POA: Diagnosis present

## 2020-01-16 DIAGNOSIS — E785 Hyperlipidemia, unspecified: Secondary | ICD-10-CM | POA: Diagnosis present

## 2020-01-16 MED ORDER — ALBUTEROL SULFATE (2.5 MG/3ML) 0.083% IN NEBU: 2.5000 mg | INHALATION_SOLUTION | Freq: Once | RESPIRATORY_TRACT | Status: DC

## 2020-01-16 MED ORDER — DEXAMETHASONE SODIUM PHOSPHATE 10 MG/ML IJ SOLN
10.0000 mg | Freq: Once | INTRAMUSCULAR | Status: AC
  Administered 2020-01-16: 10 mg via INTRAVENOUS
  Filled 2020-01-16: qty 1

## 2020-01-16 NOTE — ED Triage Notes (Signed)
Pt from home by EMS for Nausea and diarrhea. Pt was diagnosed with left lobe pneumonia this week and started on antibiotics and home oxygen. Mainly complaining of weakness and diarrhea, has been taking zofran prn for nausea. Per chart review, pt was Covid positive on 12/03/19

## 2020-01-16 NOTE — ED Notes (Signed)
This RN attempted twice to insert a PIV; this RN was unsuccessful. This RN paged IV Team for PIV Insertion and possible Blood Specimen Draw

## 2020-01-16 NOTE — Telephone Encounter (Signed)
Call placed to patient.   Reports that she is feeling slightly better today. States that she is having some increased nasal drainage and post nasal drip leading to hoarseness. States that she remains on 2L/min via [place] of O2. States that SpO2 remains 93%- 95%.  Denies fever, body aches.   States that she can get up to BR without SOB at this time.

## 2020-01-16 NOTE — ED Notes (Signed)
Pt transported to XRAY °

## 2020-01-16 NOTE — ED Provider Notes (Signed)
[facility] Provider Note   CSN: [id number] Arrival date & time: 01/16/20  2110     History Chief Complaint  Patient presents with  . Pneumonia    [name] is a 74 y.o. female past medical history of atrial fibrillation (on Eliquis), diabetes, hypertension who presents for evaluation of worsening cough, shortness of breath.  Patient was recently diagnosed with COVID-19 on 11/27/2019.  She had an admission into the hospital on 12/03/2019.  She was discharged on 3 L of home O2.  She reports that since then, she has continued to have some persistent cough.  She has been following up with her primary care doctor and cardiology.  She reports that she had a history of A. fib previously but was controlled.  Since she has had COVID-19, she has been in persistent A. fib with episodes of RVR.  She had been followed by Dr. Henrene Pastor (cardiology) who tried to increase her carvedilol.  He also increased her Lasix to 40 mg twice a day.  They were discussing whether or not they could arrange for cardioversion.  She went to her primary care doctor yesterday for evaluation of cough, shortness of breath that was continuing to get worse.  She had a chest x-ray done at cardiology and they wanted her to follow-up with her primary care doctor.  Her chest x-ray showed evidence of patchy areas of airspace consolidation to the right mid lower lung concerning for multilobar pneumonia.  She was started on Levaquin 500 mg yesterday.  Her and her primary care doctor had discussed management patient was anxious to get this treated at home.  She reports that since yesterday, she has continued to cough and felt significantly more short of breath.  She states that even walking from her chair to the bathroom, she had to stop several times to catch her breath. She is on 2L o2 and had not had to increase it. She has not noted any fever.  She has had some decreased appetite.  Cough is productive of  phlegm.  No hemoptysis.  She denies any abdominal pain, nausea/vomiting.  The history is provided by the patient.       Past Medical History:  Diagnosis Date  . Abrasion of skin with infection 05/09/2013  . Acute blood loss anemia 08/05/2012  . Anemia 11/21/2013  . Arthritis    osteoarthritis  . Atrial fibrillation (HCC)    Paroxysmal atrial fibrillation  . Bursitis of right shoulder   . Chronic cholecystitis with calculus 05/04/2013  . 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 Diabetes type 2, controlled ([place]) 05/04/2013  . Essential hypertension, benign 10/14/2012   Essential hypertension  . Fatty liver disease, nonalcoholic [id number]   hospitalized, MRCP dx fatty liver  . Gastroenteritis 10/16/2013  . GERD (gastroesophageal reflux disease)   . History of hypertension 08/20/2016  . History of total bilateral knee replacement 06/22/2017   Right= 2013. Left=2003  . Hormone replacement therapy (postmenopausal)   . 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 Hyperlipidemia   . Hypertension   . Hypertensive urgency 07/11/2018  . Hypokalemia 07/11/2018  . Hypomagnesemia 07/21/2016  . Hypothyroidism 03/28/2019  . Malnutrition of moderate degree ([place]) 03/18/2014  . Morbid obesity due to excess calories ([place]) 12/27/2012   Body mass index is 37.16 kg/m.  -  trending up  Lab Results Component Value Date  TSH 1.520 10/15/2013    Contributing to gerd risk/  doe/reviewed the need and the process to achieve and maintain neg calorie balance > defer f/u primary care including intermittently monitoring thyroid status    . Multiple pulmonary nodules 04/04/2014   Followed in Pulmonary clinic/ [facility]/ [place] - See CT chest 03/17/14 > not viz on abd CT from 10/16/13 as did not go high enough on lung slices >>>in tickle file for recall 06/17/14  - Spirometry 04/04/14 wnl sp smoking cessation 02/2014 - 06/26/2014 Right middle lobe 1.0 cm pulmonary nodule is similar over the past 3 months.  This remains indeterminate.   - CT 12/25/14  Pulmonary nodules are st  . Nausea & vomiting 07/11/2018  . Normal cardiac stress test    pt can't remember when or where  . Obesity, unspecified 12/27/2012  . Osteoarthritis of right knee 08/05/2012  . 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 without organ or systems involvement ([place])   . Rheumatoid arthritis(714.0) 09/28/2012  . Sepsis ([place]) 03/16/2014  . Sinus pause 05/17/2019   Sinus pauses  . SIRS (systemic inflammatory response syndrome) ([place]) 07/11/2018  . Syncope and collapse 03/03/2019  . 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 Thyroid nodule 03/05/2016   See CT chest 02/25/16 - u/s thyroid 03/04/16 > Pos L nodules > both Bx [id number] > BENIGN FOLLICULAR NODULE  . Unspecified essential hypertension 05/04/2013    Patient Active Problem List   Diagnosis Date Noted  . Community acquired pneumonia of left lower lobe of lung 01/15/2020  . Peripheral edema 12/26/2019  . COVID-19 virus infection 12/03/2019  . Immunocompromised state due to drug therapy 12/03/2019  . Chronic respiratory failure ([place]) 12/03/2019  . 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 Obesity (BMI 30-39.9) 07/04/2019  . Tachy-brady syndrome ([place]) 06/11/2019  . Sinus pause 05/17/2019  . Hypothyroidism 03/28/2019  . Atrial fibrillation ([place])   . Syncope and collapse 03/03/2019  . Diabetes mellitus type 2, uncomplicated ([place]) [id number]  . History of total bilateral knee replacement 06/22/2017  . Primary osteoarthritis of both hands 08/20/2016  . Hypomagnesemia 07/21/2016  . Thyroid nodule 03/05/2016  . Multiple pulmonary nodules 04/04/2014  . Anemia 11/21/2013  . 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 Bursitis of right shoulder   . GERD (gastroesophageal reflux disease)   . 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 without organ or systems involvement ([place])   . Essential hypertension, benign 10/14/2012  .  Hyperlipidemia   . Fatty liver disease, nonalcoholic   . Osteoarthritis of right knee 08/05/2012    Past Surgical History:  Procedure Laterality Date  . ABDOMINAL HYSTERECTOMY    . CHOLECYSTECTOMY N/A 05/04/2013   Procedure: LAPAROSCOPIC CHOLECYSTECTOMY WITH INTRAOPERATIVE CHOLANGIOGRAM;  Surgeon: Haywood Lasso, MD;  Location: [facility];  Service: General;  Laterality: N/A;  . ERCP N/A 05/02/2013   Procedure: ENDOSCOPIC RETROGRADE CHOLANGIOPANCREATOGRAPHY (ERCP);  Surgeon: Beryle Beams, MD;  Location: [facility];  Service: Endoscopy;  Laterality: N/A;  . JOINT REPLACEMENT     left knee  . NASAL SINUS SURGERY    . PACEMAKER IMPLANT N/A 06/11/2019   Procedure: PACEMAKER IMPLANT;  Surgeon: Deboraha Sprang, MD;  Location: [facility];  Service: Cardiovascular;  Laterality: N/A;  . SPHINCTEROTOMY  05/02/2013   Procedure: SPHINCTEROTOMY;  Surgeon: Beryle Beams, MD;  Location: [facility];  Service: Endoscopy;;  . TONSILLECTOMY    . TOTAL KNEE ARTHROPLASTY  08/01/2012   Procedure: TOTAL KNEE ARTHROPLASTY;  Surgeon: Meredith Pel, MD;  Location: [facility];  Service: Orthopedics;  Laterality: Right;  Right total knee arthroplasty  . TOTAL KNEE ARTHROPLASTY  08/01/2012   RIGHT  KNEE     OB History   No obstetric history on file.     Family History  Problem Relation Age of Onset  . Cancer Mother        breast  . Heart attack Mother   . Stroke Father   . Diabetes Sister   . Diabetes Daughter     Social History   Tobacco Use  . Smoking status: Former Smoker    Packs/day: 0.25    Years: 2.00    Pack years: 0.50    Types: Cigarettes  . Smokeless tobacco: Never Used  Substance Use Topics  . Alcohol use: Yes    Comment: occasional  . Drug use: Never    Home Medications Prior to Admission medications   Medication Sig Start Date End Date Taking? Authorizing Provider  albuterol (VENTOLIN HFA) 108 (90 Base) MCG/ACT inhaler Inhale 2 puffs into the lungs every 4  (four) hours as needed for wheezing or shortness of breath. 12/10/19   Ghimire, Henreitta Leber, MD  atorvastatin (LIPITOR) 10 MG tablet TAKE 1 TABLET BY MOUTH EVERY DAY Patient taking differently: Take 10 mg by mouth daily.  02/06/19   Delsa Grana, PA-C  benzonatate (TESSALON) 200 MG capsule Take 1 capsule (200 mg total) by mouth 3 (three) times daily. 01/15/20   Alycia Rossetti, MD  Calcium Carbonate-Vitamin D (CALCIUM 600 + D PO) Take 1 tablet by mouth 2 (two) times daily.    [provider]  carvedilol (COREG) 12.5 MG tablet Take 1.5 tablets (18.75 mg total) by mouth 2 (two) times daily with a meal. 01/09/20   Lorretta Harp, MD  chlorpheniramine-HYDROcodone (TUSSIONEX PENNKINETIC ER) 10-8 MG/5ML SUER Take 5 mLs by mouth every 12 (twelve) hours as needed. 01/15/20   Alycia Rossetti, MD  diclofenac sodium (VOLTAREN) 1 % GEL Apply 3 g topically daily as needed (to painful sites (in 3 large joints)).     [provider]  diltiazem (CARDIZEM CD) 360 MG 24 hr capsule TAKE 1 CAPSULE BY MOUTH EVERY DAY Patient taking differently: Take 360 mg by mouth daily.  08/27/19   [place], Modena Nunnery, MD  ELIQUIS 5 MG TABS tablet TAKE 1 TABLET BY MOUTH TWICE A DAY Patient taking differently: Take 5 mg by mouth 2 (two) times daily.  11/23/19   Lorretta Harp, MD  estradiol (ESTRACE) 0.5 MG tablet Take 0.5 mg by mouth 2 (two) times a day.    [provider]  etanercept (ENBREL SURECLICK) 50 MG/ML injection INJECT 50 MG (0.98 ML) UNDER THE SKIN ONCE A WEEK Patient taking differently: Inject 50 mg into the skin once a week. Thursdays (0.98 ML) 11/28/19   Bo Merino, MD  fish oil-omega-3 fatty acids 1000 MG capsule Take 1 g by mouth at bedtime.     [provider]  folic acid (FOLVITE) 1 MG tablet TAKE 1 TABLET BY MOUTH TWICE A DAY Patient taking differently: Take 1 mg by mouth 2 (two) times daily.  09/21/19   Alycia Rossetti, MD  furosemide (LASIX) 40 MG tablet Take 1 tablet (40 mg  total) by mouth 2 (two) times daily. 01/09/20   Lorretta Harp, MD  leflunomide (ARAVA) 10 MG tablet TAKE 1 TABLET BY MOUTH EVERY DAY Patient taking differently: Take 10 mg by mouth daily.  09/20/19   Bo Merino, MD  levofloxacin (LEVAQUIN) 500  MG tablet Take 1 tablet (500 mg total) by mouth daily. 01/14/20   Alycia Rossetti, MD  levothyroxine (SYNTHROID) 50 MCG tablet TAKE 1 TABLET BY MOUTH DAILY BEFORE BREAKFAST 01/14/20   [place], Modena Nunnery, MD  losartan (COZAAR) 50 MG tablet Take 1 tablet (50 mg total) by mouth daily. 09/11/19   Deboraha Sprang, MD  magnesium oxide (MAG-OX) 400 MG tablet Take 1,200 mg by mouth 2 (two) times daily.  11/22/19   [provider]  metFORMIN (GLUCOPHAGE) 500 MG tablet Take 2 tablets (1,000 mg total) by mouth 2 (two) times daily with a meal. 12/27/19   [place], Modena Nunnery, MD  methocarbamol (ROBAXIN) 500 MG tablet Take 1 tablet (500 mg total) by mouth daily as needed for muscle spasms. Patient taking differently: Take 500 mg by mouth as needed for muscle spasms.  02/28/19   Ofilia Neas, PA-C  Multiple Vitamins-Minerals (MULTIVITAMINS THER. W/MINERALS) TABS Take 1 tablet by mouth daily.    [provider]  ondansetron (ZOFRAN) 4 MG tablet Take 1 tablet (4 mg total) by mouth every 8 (eight) hours as needed for nausea or vomiting. 01/15/20   Alycia Rossetti, MD  pantoprazole (PROTONIX) 40 MG tablet TAKE 1 TABLET BY MOUTH EVERY DAY 01/15/20   [place], Modena Nunnery, MD  potassium chloride (KLOR-CON) 10 MEQ tablet Take 1 tablet (10 mEq total) by mouth daily. 10/03/19   Deboraha Sprang, MD  vitamin B-12 (CYANOCOBALAMIN) 500 MCG tablet Take 500 mcg by mouth daily.    [provider]    Allergies    Monopril [fosinopril], Magnesium-containing compounds, and Norvasc [amlodipine besylate]  Review of Systems   Review of Systems  Constitutional: Positive for appetite change. Negative for fever.  Respiratory: Positive for cough and shortness of breath.     Cardiovascular: Negative for chest pain.  Gastrointestinal: Negative for abdominal pain, nausea and vomiting.  Genitourinary: Negative for dysuria and hematuria.  Neurological: Negative for headaches.  All other systems reviewed and are negative.   Physical Exam Updated Vital Signs BP 113/73 (BP Location: Left Arm)   Pulse 95   Temp 98.4 F (36.9 C) (Oral)   Resp 18   SpO2 95%   Physical Exam Vitals and nursing note reviewed.  Constitutional:      Appearance: Normal appearance. She is well-developed.  HENT:     Head: Normocephalic and atraumatic.  Eyes:     General: Lids are normal.     Conjunctiva/sclera: Conjunctivae normal.     Pupils: Pupils are equal, round, and reactive to light.  Cardiovascular:     Rate and Rhythm: Normal rate and regular rhythm.     Pulses: Normal pulses.     Heart sounds: Normal heart sounds. No murmur. No friction rub. No gallop.   Pulmonary:     Effort: Pulmonary effort is normal.     Comments: Intermittently coughing.  Scattered rhonchi. Mild wheezing.  Speaking in medium sentences. Abdominal:     Palpations: Abdomen is soft. Abdomen is not rigid.     Tenderness: There is no abdominal tenderness. There is no guarding.     Comments: Abdomen is soft, non-distended, non-tender. No rigidity, No guarding. No peritoneal signs.  Musculoskeletal:        General: Normal range of motion.     Cervical back: Full passive range of motion without pain.  Skin:    General: Skin is warm and dry.     Capillary Refill: Capillary refill takes less than 2  seconds.  Neurological:     Mental Status: She is alert and oriented to person, place, and time.  Psychiatric:        Speech: Speech normal.     ED Results / Procedures / Treatments   Labs (all labs ordered are listed, but only abnormal results are displayed) Labs Reviewed  COMPREHENSIVE METABOLIC PANEL  CBC WITH DIFFERENTIAL/PLATELET  BRAIN NATRIURETIC PEPTIDE    EKG EKG  Interpretation  Date/Time:  Wednesday Jan 16 2020 23:10:51 EDT Ventricular Rate:  105 PR Interval:    QRS Duration: 78 QT Interval:  296 QTC Calculation: 391 R Axis:   32 Text Interpretation: Atrial fibrillation with rapid ventricular response with premature ventricular or aberrantly conducted complexes Low voltage QRS Cannot rule out Anterior infarct , age undetermined Abnormal ECG Confirmed by Ripley Fraise [phone]) on 01/16/2020 11:24:25 PM   Radiology DG Chest 2 View  Result Date: 01/16/2020 CLINICAL DATA:  Cough. EXAM: CHEST - 2 VIEW COMPARISON:  Most recent radiograph 2 days ago 01/14/2020, additional priors. FINDINGS: Left-sided pacemaker remains in place. Similar appearance of moderate size left pleural effusion and adjacent basilar opacity. Worsening patchy opacity in the right mid lower lung zone. Suspected small right pleural effusion. Unchanged heart size and mediastinal contours, left heart border partially obscured by adjacent pleural effusion. No pneumothorax. Chronic change of the left shoulder. IMPRESSION: 1. Similar appearance of moderate size left pleural effusion and adjacent basilar opacity over the past 2 days. 2. Worsening patchy opacity in the right mid and lower lung zone, suspicious for pneumonia. Possible small right pleural effusion. Electronically Signed   By: Keith Rake M.D.   On: 01/16/2020 22:43    Procedures Procedures (including critical care time)  Medications Ordered in ED Medications  albuterol (PROVENTIL) (2.5 MG/3ML) 0.083% nebulizer solution 2.5 mg (has no administration in time range)  dexamethasone (DECADRON) injection 10 mg (10 mg Intravenous Given 01/16/20 2345)    ED Course  I have reviewed the triage vital signs and the nursing notes.  Pertinent labs & imaging results that were available during my care of the patient were reviewed by me and considered in my medical decision making (see chart for details).    MDM  Rules/Calculators/A&P                      74 year old female past mostly of A. fib (currently on Eliquis), diabetes who presents for evaluation of cough and shortness of breath.  Diagnosed with pneumonia yesterday.  Started on Levaquin which she states she has been taking.  Comes in today because she continues to have worsening shortness of breath cough.  She was recently seen in [facility] for evaluation of continued and persistent A. fib.  Additionally, she had her carvedilol and Lasix increased.  On initially arrival, she is afebrile, vitals are stable.  She is maintaining O2 sats of 95% on 2 L O2.  She is intermittently coughing and does have scattered rhonchi noted.  Concern for infectious etiology.  Plan for labs, chest x-ray. Doubt PE. Patient is on eliquis.   Chest x-ray shows similar appearance of moderate size left pleural effusion and adjacent basilar opacity over the last 2 days.  Worsening patchy opacity in the right mid and lower lung zone suspicious for pneumonia.  Possible small right pleural effusion.  Patient signed out to [facility], PA-C pending labs.   Portions of this note were generated with [employer]. Dictation errors may occur despite  best attempts at proofreading.   Final Clinical Impression(s) / ED Diagnoses Final diagnoses:  Community acquired pneumonia of right lung, unspecified part of lung    Rx / DC Orders ED Discharge Orders    None       Desma Mcgregor 01/17/20 0000    Ripley Fraise, MD 01/17/20 [phone]

## 2020-01-16 NOTE — ED Notes (Signed)
This RN was again unable to draw blood specimens; Phlebotomy notified.

## 2020-01-16 NOTE — Telephone Encounter (Signed)
Call pt and see how her breathing and cough is?   If she has had to increase oxygen, breathing is worsening, fever, increased swelling, needs to go to the hospital.

## 2020-01-16 NOTE — ED Provider Notes (Signed)
23:30: Assumed care of patient from Providence Lanius PA-C at change of shift pending labs & consult for admission.   Please see prior provider note for full H&P briefly patient is a 74 yo female with a hx of afib (on eliquis), anemia, T2DM, obesity, afib, hypertension, hyperlipidemia, and chronic 2L O2 s/p covid infection in April of this year who presented to the ED with general worsening of her sxs including productive cough and dyspnea with generalized weakness. Not eating/drinking much. Saw PCP started on Levaquin yesterday, but feels she is getting much worse and does not feel she can be managed outpatient. Feels dyspneic on her baseline oxygen requirement.    Physical Exam  BP 113/73 (BP Location: Left Arm)   Pulse 95   Temp 98.4 F (36.9 C) (Oral)   Resp 18   SpO2 95%   Physical Exam Vitals and nursing note reviewed.  Constitutional:      General: She is not in acute distress.    Appearance: She is well-developed.  HENT:     Head: Normocephalic and atraumatic.  Eyes:     General:        Right eye: No discharge.        Left eye: No discharge.     Conjunctiva/sclera: Conjunctivae normal.  Cardiovascular:     Comments: Decreased breath sounds at the bases.  Mild wheezing noted. Musculoskeletal:     Comments: 2-3+ symmetric pitting edema noted to the lower legs  Neurological:     Mental Status: She is alert.     Comments: Clear speech.   Psychiatric:        Behavior: Behavior normal.        Thought Content: Thought content normal.    ED Course/Procedures     Results for orders placed or performed during the hospital encounter of 01/16/20  Comprehensive metabolic panel  Result Value Ref Range   Sodium 136 135 - 145 mmol/L   Potassium 4.2 3.5 - 5.1 mmol/L   Chloride 100 98 - 111 mmol/L   CO2 28 22 - 32 mmol/L   Glucose, Bld 103 (H) 70 - 99 mg/dL   BUN 21 8 - 23 mg/dL   Creatinine, Ser 1.63 (H) 0.44 - 1.00 mg/dL   Calcium 6.9 (L) 8.9 - 10.3 mg/dL   Total Protein 6.0  (L) 6.5 - 8.1 g/dL   Albumin 1.7 (L) 3.5 - 5.0 g/dL   AST 19 15 - 41 U/L   ALT 15 0 - 44 U/L   Alkaline Phosphatase 88 38 - 126 U/L   Total Bilirubin 0.6 0.3 - 1.2 mg/dL   GFR calc non Af Amer 31 (L) >60 mL/min   GFR calc Af Amer 35 (L) >60 mL/min   Anion gap 8 5 - 15  CBC with Differential  Result Value Ref Range   WBC 9.3 4.0 - 10.5 K/uL   RBC 3.51 (L) 3.87 - 5.11 MIL/uL   Hemoglobin 10.5 (L) 12.0 - 15.0 g/dL   HCT 33.9 (L) 36.0 - 46.0 %   MCV 96.6 80.0 - 100.0 fL   MCH 29.9 26.0 - 34.0 pg   MCHC 31.0 30.0 - 36.0 g/dL   RDW 16.6 (H) 11.5 - 15.5 %   Platelets 263 150 - 400 K/uL   nRBC 0.0 0.0 - 0.2 %   Neutrophils Relative % 67 %   Neutro Abs 6.3 1.7 - 7.7 K/uL   Lymphocytes Relative 15 %   Lymphs Abs 1.4 0.7 - 4.0  K/uL   Monocytes Relative 13 %   Monocytes Absolute 1.2 (H) 0.1 - 1.0 K/uL   Eosinophils Relative 2 %   Eosinophils Absolute 0.1 0.0 - 0.5 K/uL   Basophils Relative 1 %   Basophils Absolute 0.1 0.0 - 0.1 K/uL   Immature Granulocytes 2 %   Abs Immature Granulocytes 0.14 (H) 0.00 - 0.07 K/uL   DG Chest 2 View  Result Date: 01/16/2020 CLINICAL DATA:  Cough. EXAM: CHEST - 2 VIEW COMPARISON:  Most recent radiograph 2 days ago 01/14/2020, additional priors. FINDINGS: Left-sided pacemaker remains in place. Similar appearance of moderate size left pleural effusion and adjacent basilar opacity. Worsening patchy opacity in the right mid lower lung zone. Suspected small right pleural effusion. Unchanged heart size and mediastinal contours, left heart border partially obscured by adjacent pleural effusion. No pneumothorax. Chronic change of the left shoulder. IMPRESSION: 1. Similar appearance of moderate size left pleural effusion and adjacent basilar opacity over the past 2 days. 2. Worsening patchy opacity in the right mid and lower lung zone, suspicious for pneumonia. Possible small right pleural effusion. Electronically Signed   By: Keith Rake M.D.   On: 01/16/2020 22:43    DG Chest 2 View  Result Date: 01/14/2020 CLINICAL DATA:  74 year old female with history of cough and dyspnea. EXAM: CHEST - 2 VIEW COMPARISON:  Chest x-ray 12/09/2019. FINDINGS: New moderate to large left pleural effusion with opacity at the left base which may reflect atelectasis and/or consolidation. More ill-defined opacities are also noted throughout the right mid to lower lung, concerning for areas of airspace consolidation. No pneumothorax. No evidence of pulmonary edema. Cardiac silhouette is obscured. Upper mediastinal contours are within normal limits. Aortic atherosclerosis. Left-sided pacemaker device in place with lead tips projecting over the expected location of the right atrium and right ventricle. Surgical clips project over the right upper quadrant of the abdomen, likely from prior cholecystectomy. IMPRESSION: 1. Interval development of moderate to large left pleural effusion with atelectasis and/or consolidation in the left lung base. 2. Patchy areas of airspace consolidation throughout the right mid to lower lung concerning for multilobar pneumonia. 3. Aortic atherosclerosis. Electronically Signed   By: Vinnie Langton M.D.   On: 01/14/2020 13:33     Procedures  [name] was evaluated in Emergency Department on 01/17/2020 for the symptoms described in the history of present illness. He/she was evaluated in the context of the global COVID-19 pandemic, which necessitated consideration that the patient might be at risk for infection with the SARS-CoV-2 virus that causes COVID-19. Institutional protocols and algorithms that pertain to the evaluation of patients at risk for COVID-19 are in a state of rapid change based on information released by regulatory bodies including the CDC and federal and state organizations. These policies and algorithms were followed during the patient's care in the ED.  MDM   CXR:  1. Interval development of moderate to large left pleural effusion with  atelectasis and/or consolidation in the left lung base. 2. Patchy areas of airspace consolidation throughout the right mid to lower lung concerning for multilobar pneumonia. 3. Aortic atherosclerosis.  CBC: No leukocytosis.  Anemia similar to prior. CMP: Renal function with creatinine increased from prior, 1.63 today, most recently 1.40.  She has worsening hypocalcemia and hypoalbuminemia. BNP: Elevated compared to prior.   Prior records reviewed: Echocardiogram 03/04/2019: 1. The left ventricle has normal systolic function with an ejection  fraction of 60-65%. The cavity size was normal. There is mildly increased  left ventricular wall thickness. Left ventricular diastolic Doppler  parameters are consistent with  pseudonormalization.  2. The right ventricle has normal systolic function. The cavity was  normal. There is no increase in right ventricular wall thickness.  3. Left atrial size was mildly dilated.  4. No evidence of mitral valve stenosis.  5. Aortic valve regurgitation is mild by color flow Doppler. No stenosis  of the aortic valve.  6. The interatrial septum was not assessed.    Suspect patient's sxs are multifactorial with worsening multi lobar pneumonia as well as likely acute CHF. In addition to her respiratory concerns, patient also states that she has had lower extremity swelling for about a week.  She takes Lasix 40 mg twice daily outpatient. We will start IV antibiotics for pneumonia.  Will give one-time dose of IV Lasix with potassium.  Decadron and albuterol inhaler have been ordered for her wheezing by prior provider.  Plan to discuss with hospitalist service for admission.  Covid testing will not be obtained as patient has had COVID-19 within the past 90 days.  This is a shared visit with supervising physician Dr. Christy Gentles who has independently evaluated patient & is in agreement.   00:53: CONSULT: Discussed with hospitalist Dr. Hal Hope- accepts admission.      Leafy Kindle 01/17/20 0132    Ripley Fraise, MD 01/17/20 2308

## 2020-01-16 NOTE — Telephone Encounter (Signed)
She can use nasal saline or flonase for the drainage She has appt next week, keep that one

## 2020-01-17 ENCOUNTER — Encounter (HOSPITAL_COMMUNITY): Payer: Self-pay | Admitting: Internal Medicine

## 2020-01-17 DIAGNOSIS — Z8616 Personal history of COVID-19: Secondary | ICD-10-CM | POA: Diagnosis not present

## 2020-01-17 DIAGNOSIS — Z79890 Hormone replacement therapy (postmenopausal): Secondary | ICD-10-CM | POA: Diagnosis not present

## 2020-01-17 DIAGNOSIS — E785 Hyperlipidemia, unspecified: Secondary | ICD-10-CM | POA: Diagnosis present

## 2020-01-17 DIAGNOSIS — Z950 Presence of cardiac pacemaker: Secondary | ICD-10-CM

## 2020-01-17 DIAGNOSIS — Z7901 Long term (current) use of anticoagulants: Secondary | ICD-10-CM | POA: Diagnosis not present

## 2020-01-17 DIAGNOSIS — E039 Hypothyroidism, unspecified: Secondary | ICD-10-CM | POA: Diagnosis present

## 2020-01-17 DIAGNOSIS — I4819 Other persistent atrial fibrillation: Secondary | ICD-10-CM

## 2020-01-17 DIAGNOSIS — I4891 Unspecified atrial fibrillation: Secondary | ICD-10-CM

## 2020-01-17 DIAGNOSIS — I5033 Acute on chronic diastolic (congestive) heart failure: Secondary | ICD-10-CM

## 2020-01-17 DIAGNOSIS — J90 Pleural effusion, not elsewhere classified: Secondary | ICD-10-CM

## 2020-01-17 DIAGNOSIS — R5381 Other malaise: Secondary | ICD-10-CM | POA: Diagnosis not present

## 2020-01-17 DIAGNOSIS — E8809 Other disorders of plasma-protein metabolism, not elsewhere classified: Secondary | ICD-10-CM | POA: Diagnosis present

## 2020-01-17 DIAGNOSIS — I495 Sick sinus syndrome: Secondary | ICD-10-CM | POA: Diagnosis present

## 2020-01-17 DIAGNOSIS — I351 Nonrheumatic aortic (valve) insufficiency: Secondary | ICD-10-CM | POA: Diagnosis not present

## 2020-01-17 DIAGNOSIS — J918 Pleural effusion in other conditions classified elsewhere: Secondary | ICD-10-CM | POA: Diagnosis present

## 2020-01-17 DIAGNOSIS — J189 Pneumonia, unspecified organism: Secondary | ICD-10-CM

## 2020-01-17 DIAGNOSIS — J9601 Acute respiratory failure with hypoxia: Secondary | ICD-10-CM | POA: Diagnosis present

## 2020-01-17 DIAGNOSIS — N189 Chronic kidney disease, unspecified: Secondary | ICD-10-CM | POA: Diagnosis not present

## 2020-01-17 DIAGNOSIS — R0602 Shortness of breath: Secondary | ICD-10-CM | POA: Diagnosis present

## 2020-01-17 DIAGNOSIS — K760 Fatty (change of) liver, not elsewhere classified: Secondary | ICD-10-CM | POA: Diagnosis present

## 2020-01-17 DIAGNOSIS — J9621 Acute and chronic respiratory failure with hypoxia: Secondary | ICD-10-CM | POA: Diagnosis present

## 2020-01-17 DIAGNOSIS — Z20822 Contact with and (suspected) exposure to covid-19: Secondary | ICD-10-CM | POA: Diagnosis present

## 2020-01-17 DIAGNOSIS — E1122 Type 2 diabetes mellitus with diabetic chronic kidney disease: Secondary | ICD-10-CM | POA: Diagnosis present

## 2020-01-17 DIAGNOSIS — I361 Nonrheumatic tricuspid (valve) insufficiency: Secondary | ICD-10-CM | POA: Diagnosis not present

## 2020-01-17 DIAGNOSIS — M0579 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 without organ or systems involvement: Secondary | ICD-10-CM

## 2020-01-17 DIAGNOSIS — E1165 Type 2 diabetes mellitus with hyperglycemia: Secondary | ICD-10-CM | POA: Diagnosis not present

## 2020-01-17 DIAGNOSIS — J9611 Chronic respiratory failure with hypoxia: Secondary | ICD-10-CM

## 2020-01-17 DIAGNOSIS — Z9981 Dependence on supplemental oxygen: Secondary | ICD-10-CM | POA: Diagnosis not present

## 2020-01-17 DIAGNOSIS — N179 Acute kidney failure, unspecified: Secondary | ICD-10-CM | POA: Diagnosis present

## 2020-01-17 DIAGNOSIS — N1832 Chronic kidney disease, stage 3b: Secondary | ICD-10-CM | POA: Diagnosis present

## 2020-01-17 DIAGNOSIS — I130 Hypertensive heart and chronic kidney disease with heart failure and stage 1 through stage 4 chronic kidney disease, or unspecified chronic kidney disease: Secondary | ICD-10-CM | POA: Diagnosis present

## 2020-01-17 DIAGNOSIS — K219 Gastro-esophageal reflux disease without esophagitis: Secondary | ICD-10-CM | POA: Diagnosis present

## 2020-01-17 DIAGNOSIS — I10 Essential (primary) hypertension: Secondary | ICD-10-CM

## 2020-01-17 DIAGNOSIS — D84821 Immunodeficiency due to drugs: Secondary | ICD-10-CM | POA: Diagnosis present

## 2020-01-17 LAB — CBC WITH DIFFERENTIAL/PLATELET
Abs Immature Granulocytes: 0.08 10*3/uL — ABNORMAL HIGH (ref 0.00–0.07)
Abs Immature Granulocytes: 0.14 10*3/uL — ABNORMAL HIGH (ref 0.00–0.07)
Basophils Absolute: 0 10*3/uL (ref 0.0–0.1)
Basophils Absolute: 0.1 10*3/uL (ref 0.0–0.1)
Basophils Relative: 0 %
Basophils Relative: 1 %
Eosinophils Absolute: 0 10*3/uL (ref 0.0–0.5)
Eosinophils Absolute: 0.1 10*3/uL (ref 0.0–0.5)
Eosinophils Relative: 0 %
Eosinophils Relative: 2 %
HCT: 32.2 % — ABNORMAL LOW (ref 36.0–46.0)
HCT: 33.9 % — ABNORMAL LOW (ref 36.0–46.0)
Hemoglobin: 10.1 g/dL — ABNORMAL LOW (ref 12.0–15.0)
Hemoglobin: 10.5 g/dL — ABNORMAL LOW (ref 12.0–15.0)
Immature Granulocytes: 1 %
Immature Granulocytes: 2 %
Lymphocytes Relative: 11 %
Lymphocytes Relative: 15 %
Lymphs Abs: 0.7 10*3/uL (ref 0.7–4.0)
Lymphs Abs: 1.4 10*3/uL (ref 0.7–4.0)
MCH: 29.9 pg (ref 26.0–34.0)
MCH: 30 pg (ref 26.0–34.0)
MCHC: 31 g/dL (ref 30.0–36.0)
MCHC: 31.4 g/dL (ref 30.0–36.0)
MCV: 95.5 fL (ref 80.0–100.0)
MCV: 96.6 fL (ref 80.0–100.0)
Monocytes Absolute: 0.1 10*3/uL (ref 0.1–1.0)
Monocytes Absolute: 1.2 10*3/uL — ABNORMAL HIGH (ref 0.1–1.0)
Monocytes Relative: 13 %
Monocytes Relative: 2 %
Neutro Abs: 5.3 10*3/uL (ref 1.7–7.7)
Neutro Abs: 6.3 10*3/uL (ref 1.7–7.7)
Neutrophils Relative %: 67 %
Neutrophils Relative %: 86 %
Platelets: 263 10*3/uL (ref 150–400)
Platelets: 265 10*3/uL (ref 150–400)
RBC: 3.37 MIL/uL — ABNORMAL LOW (ref 3.87–5.11)
RBC: 3.51 MIL/uL — ABNORMAL LOW (ref 3.87–5.11)
RDW: 16.6 % — ABNORMAL HIGH (ref 11.5–15.5)
RDW: 16.6 % — ABNORMAL HIGH (ref 11.5–15.5)
WBC: 6.2 10*3/uL (ref 4.0–10.5)
WBC: 9.3 10*3/uL (ref 4.0–10.5)
nRBC: 0 % (ref 0.0–0.2)
nRBC: 0 % (ref 0.0–0.2)

## 2020-01-17 LAB — COMPREHENSIVE METABOLIC PANEL
ALT: 14 U/L (ref 0–44)
ALT: 15 U/L (ref 0–44)
AST: 19 U/L (ref 15–41)
AST: 29 U/L (ref 15–41)
Albumin: 1.6 g/dL — ABNORMAL LOW (ref 3.5–5.0)
Albumin: 1.7 g/dL — ABNORMAL LOW (ref 3.5–5.0)
Alkaline Phosphatase: 85 U/L (ref 38–126)
Alkaline Phosphatase: 88 U/L (ref 38–126)
Anion gap: 15 (ref 5–15)
Anion gap: 8 (ref 5–15)
BUN: 21 mg/dL (ref 8–23)
BUN: 21 mg/dL (ref 8–23)
CO2: 26 mmol/L (ref 22–32)
CO2: 28 mmol/L (ref 22–32)
Calcium: 6.9 mg/dL — ABNORMAL LOW (ref 8.9–10.3)
Calcium: 7 mg/dL — ABNORMAL LOW (ref 8.9–10.3)
Chloride: 100 mmol/L (ref 98–111)
Chloride: 97 mmol/L — ABNORMAL LOW (ref 98–111)
Creatinine, Ser: 1.62 mg/dL — ABNORMAL HIGH (ref 0.44–1.00)
Creatinine, Ser: 1.63 mg/dL — ABNORMAL HIGH (ref 0.44–1.00)
GFR calc Af Amer: 35 mL/min — ABNORMAL LOW (ref >60)
GFR calc Af Amer: 36 mL/min — ABNORMAL LOW (ref >60)
GFR calc non Af Amer: 31 mL/min — ABNORMAL LOW (ref >60)
GFR calc non Af Amer: 31 mL/min — ABNORMAL LOW (ref >60)
Glucose, Bld: 103 mg/dL — ABNORMAL HIGH (ref 70–99)
Glucose, Bld: 166 mg/dL — ABNORMAL HIGH (ref 70–99)
Potassium: 4.2 mmol/L (ref 3.5–5.1)
Potassium: 4.8 mmol/L (ref 3.5–5.1)
Sodium: 136 mmol/L (ref 135–145)
Sodium: 138 mmol/L (ref 135–145)
Total Bilirubin: 0.6 mg/dL (ref 0.3–1.2)
Total Bilirubin: 1.3 mg/dL — ABNORMAL HIGH (ref 0.3–1.2)
Total Protein: 5.9 g/dL — ABNORMAL LOW (ref 6.5–8.1)
Total Protein: 6 g/dL — ABNORMAL LOW (ref 6.5–8.1)

## 2020-01-17 LAB — PROCALCITONIN: Procalcitonin: 0.12 ng/mL

## 2020-01-17 LAB — BRAIN NATRIURETIC PEPTIDE: B Natriuretic Peptide: 526.5 pg/mL — ABNORMAL HIGH (ref 0.0–100.0)

## 2020-01-17 LAB — SARS CORONAVIRUS 2 BY RT PCR (HOSPITAL ORDER, PERFORMED IN ~~LOC~~ HOSPITAL LAB): SARS Coronavirus 2: NEGATIVE

## 2020-01-17 LAB — TROPONIN I (HIGH SENSITIVITY)
Troponin I (High Sensitivity): 7 ng/L (ref <18)
Troponin I (High Sensitivity): 6 ng/L (ref <18)

## 2020-01-17 LAB — TSH: TSH: 5.118 u[IU]/mL — ABNORMAL HIGH (ref 0.350–4.500)

## 2020-01-17 MED ORDER — DILTIAZEM HCL ER COATED BEADS 360 MG PO CP24
360.0000 mg | ORAL_CAPSULE | Freq: Every day | ORAL | Status: DC
  Administered 2020-01-17 – 2020-01-23 (×7): 360 mg via ORAL
  Filled 2020-01-17: qty 2
  Filled 2020-01-17: qty 1
  Filled 2020-01-17 (×2): qty 2
  Filled 2020-01-17: qty 1
  Filled 2020-01-17: qty 2
  Filled 2020-01-17 (×2): qty 1
  Filled 2020-01-17 (×2): qty 2
  Filled 2020-01-17 (×3): qty 1

## 2020-01-17 MED ORDER — ONDANSETRON HCL 4 MG PO TABS
4.0000 mg | ORAL_TABLET | Freq: Four times a day (QID) | ORAL | Status: DC | PRN
  Administered 2020-01-19: 4 mg via ORAL
  Filled 2020-01-17: qty 1

## 2020-01-17 MED ORDER — LOSARTAN POTASSIUM 50 MG PO TABS: 50.0000 mg | ORAL_TABLET | Freq: Every day | ORAL | Status: DC

## 2020-01-17 MED ORDER — ALBUTEROL SULFATE HFA 108 (90 BASE) MCG/ACT IN AERS
2.0000 | INHALATION_SPRAY | Freq: Once | RESPIRATORY_TRACT | Status: AC
  Administered 2020-01-17: 2 via RESPIRATORY_TRACT
  Filled 2020-01-17: qty 6.7

## 2020-01-17 MED ORDER — SODIUM CHLORIDE 0.9 % IV SOLN
1.0000 g | Freq: Once | INTRAVENOUS | Status: AC
  Administered 2020-01-17: 1 g via INTRAVENOUS
  Filled 2020-01-17: qty 10

## 2020-01-17 MED ORDER — SODIUM CHLORIDE 0.9 % IV SOLN
500.0000 mg | Freq: Once | INTRAVENOUS | Status: AC
  Administered 2020-01-17: 500 mg via INTRAVENOUS
  Filled 2020-01-17: qty 500

## 2020-01-17 MED ORDER — POTASSIUM CHLORIDE CRYS ER 20 MEQ PO TBCR
20.0000 meq | EXTENDED_RELEASE_TABLET | Freq: Once | ORAL | Status: AC
  Administered 2020-01-17: 20 meq via ORAL
  Filled 2020-01-17: qty 1

## 2020-01-17 MED ORDER — SODIUM CHLORIDE 0.9 % IV SOLN
500.0000 mg | INTRAVENOUS | Status: AC
  Administered 2020-01-17 – 2020-01-21 (×5): 500 mg via INTRAVENOUS
  Filled 2020-01-17 (×5): qty 500

## 2020-01-17 MED ORDER — APIXABAN 5 MG PO TABS
5.0000 mg | ORAL_TABLET | Freq: Two times a day (BID) | ORAL | Status: DC
  Administered 2020-01-17: 5 mg via ORAL
  Filled 2020-01-17 (×2): qty 1

## 2020-01-17 MED ORDER — HEPARIN (PORCINE) 25000 UT/250ML-% IV SOLN
1100.0000 [IU]/h | INTRAVENOUS | Status: DC
  Administered 2020-01-17: 1150 [IU]/h via INTRAVENOUS
  Administered 2020-01-18: 1000 [IU]/h via INTRAVENOUS
  Administered 2020-01-19: 1050 [IU]/h via INTRAVENOUS
  Filled 2020-01-17 (×3): qty 250

## 2020-01-17 MED ORDER — ACETAMINOPHEN 325 MG PO TABS
650.0000 mg | ORAL_TABLET | Freq: Four times a day (QID) | ORAL | Status: DC | PRN
  Administered 2020-01-17 – 2020-01-21 (×5): 650 mg via ORAL
  Filled 2020-01-17 (×5): qty 2

## 2020-01-17 MED ORDER — SODIUM CHLORIDE 0.9 % IV SOLN
2.0000 g | INTRAVENOUS | Status: AC
  Administered 2020-01-17 – 2020-01-21 (×5): 2 g via INTRAVENOUS
  Filled 2020-01-17: qty 2
  Filled 2020-01-17: qty 20
  Filled 2020-01-17 (×3): qty 2

## 2020-01-17 MED ORDER — ATORVASTATIN CALCIUM 10 MG PO TABS
10.0000 mg | ORAL_TABLET | Freq: Every day | ORAL | Status: DC
  Administered 2020-01-17 – 2020-01-25 (×9): 10 mg via ORAL
  Filled 2020-01-17 (×9): qty 1

## 2020-01-17 MED ORDER — MAGNESIUM OXIDE 400 (241.3 MG) MG PO TABS
1200.0000 mg | ORAL_TABLET | Freq: Two times a day (BID) | ORAL | Status: DC
  Filled 2020-01-17 (×2): qty 3

## 2020-01-17 MED ORDER — LEVOTHYROXINE SODIUM 50 MCG PO TABS
50.0000 ug | ORAL_TABLET | Freq: Every day | ORAL | Status: DC
  Administered 2020-01-17 – 2020-01-26 (×10): 50 ug via ORAL
  Filled 2020-01-17 (×10): qty 1

## 2020-01-17 MED ORDER — CYANOCOBALAMIN 500 MCG PO TABS
500.0000 ug | ORAL_TABLET | Freq: Every day | ORAL | Status: DC
  Administered 2020-01-17 – 2020-01-26 (×10): 500 ug via ORAL
  Filled 2020-01-17 (×11): qty 1

## 2020-01-17 MED ORDER — ONDANSETRON HCL 4 MG/2ML IJ SOLN
4.0000 mg | Freq: Four times a day (QID) | INTRAMUSCULAR | Status: DC | PRN
  Filled 2020-01-17: qty 2

## 2020-01-17 MED ORDER — FUROSEMIDE 10 MG/ML IJ SOLN
40.0000 mg | Freq: Once | INTRAMUSCULAR | Status: AC
  Administered 2020-01-17: 40 mg via INTRAVENOUS
  Filled 2020-01-17: qty 4

## 2020-01-17 MED ORDER — FUROSEMIDE 10 MG/ML IJ SOLN
20.0000 mg | Freq: Every day | INTRAMUSCULAR | Status: DC
  Administered 2020-01-17 – 2020-01-18 (×2): 20 mg via INTRAVENOUS
  Filled 2020-01-17: qty 2

## 2020-01-17 MED ORDER — PANTOPRAZOLE SODIUM 40 MG PO TBEC
40.0000 mg | DELAYED_RELEASE_TABLET | Freq: Every day | ORAL | Status: DC
  Administered 2020-01-17 – 2020-01-26 (×10): 40 mg via ORAL
  Filled 2020-01-17 (×10): qty 1

## 2020-01-17 MED ORDER — FOLIC ACID 1 MG PO TABS
1.0000 mg | ORAL_TABLET | Freq: Two times a day (BID) | ORAL | Status: DC
  Administered 2020-01-17 – 2020-01-26 (×19): 1 mg via ORAL
  Filled 2020-01-17 (×19): qty 1

## 2020-01-17 MED ORDER — CARVEDILOL 6.25 MG PO TABS
18.7500 mg | ORAL_TABLET | Freq: Two times a day (BID) | ORAL | Status: DC
  Administered 2020-01-17 – 2020-01-19 (×5): 18.75 mg via ORAL
  Filled 2020-01-17 (×5): qty 1

## 2020-01-17 NOTE — H&P (Signed)
History and Physical    [name] [id number] DOB: 1946/01/24 DOA: 01/16/2020  PCP: Alycia Rossetti, MD  Patient coming from: Home.  Chief Complaint: Shortness of breath.  HPI: [name] is a 74 y.o. female with history of tachybradycardia syndrome status post pacemaker placement A. fib diabetes mellitus type 2 was admitted last month for COVID-19 pneumonia has been having increasing shortness of breath over the last 1 week and had gone to her cardiology office about a week ago when patient's Lasix dose was doubled.  Patient also had gone to her primary care physician about 3 to 4 days ago because of the worsening shortness of breath.  Productive cough for which patient was started on Levaquin.  Despite taking these medications and changes patient was still short of breath and presents to the ER.  ED Course: In the ER on exam patient has elevated JVD bilateral lower extremity edema chest x-ray showing infiltrates of the right side and pleural effusion on the left side with labs showing worsening renal function with creatinine of 1.6 BNP 526 high sensitive troponin 7 hemoglobin 10.5 EKG was showing A. fib with RVR.  Patient was given Lasix 40 mg IV empiric antibiotic started for possible pneumonia admitted for acute respiratory failure hypoxia likely a combination of CHF and pneumonia.    Review of Systems: As per HPI, rest all negative.   Past Medical History:  Diagnosis Date  . Abrasion of skin with infection 05/09/2013  . Acute blood loss anemia 08/05/2012  . Anemia 11/21/2013  . Arthritis    osteoarthritis  . Atrial fibrillation (HCC)    Paroxysmal atrial fibrillation  . Bursitis of right shoulder   . Chronic cholecystitis with calculus 05/04/2013  . 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 Diabetes type 2, controlled ([place]) 05/04/2013  . Essential hypertension, benign 10/14/2012   Essential hypertension  . Fatty liver disease,  nonalcoholic [id number]   hospitalized, MRCP dx fatty liver  . Gastroenteritis 10/16/2013  . GERD (gastroesophageal reflux disease)   . History of hypertension 08/20/2016  . History of total bilateral knee replacement 06/22/2017   Right= 2013. Left=2003  . Hormone replacement therapy (postmenopausal)   . 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 Hyperlipidemia   . Hypertension   . Hypertensive urgency 07/11/2018  . Hypokalemia 07/11/2018  . Hypomagnesemia 07/21/2016  . Hypothyroidism 03/28/2019  . Malnutrition of moderate degree ([place]) 03/18/2014  . Morbid obesity due to excess calories ([place]) 12/27/2012   Body mass index is 37.16 kg/m.  -  trending up  Lab Results Component Value Date  TSH 1.520 10/15/2013    Contributing to gerd risk/ doe/reviewed the need and the process to achieve and maintain neg calorie balance > defer f/u primary care including intermittently monitoring thyroid status    . Multiple pulmonary nodules 04/04/2014   Followed in Pulmonary clinic/ [facility]/ [place] - See CT chest 03/17/14 > not viz on abd CT from 10/16/13 as did not go high enough on lung slices >>>in tickle file for recall 06/17/14  - Spirometry 04/04/14 wnl sp smoking cessation 02/2014 - 06/26/2014 Right middle lobe 1.0 cm pulmonary nodule is similar over the past 3 months. This remains indeterminate.   - CT 12/25/14  Pulmonary nodules are st  . Nausea & vomiting 07/11/2018  . Normal cardiac stress test    pt can't remember when or where  . Obesity, unspecified 12/27/2012  . Osteoarthritis of right  knee 08/05/2012  . 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 without organ or systems involvement ([place])   . Rheumatoid arthritis(714.0) 09/28/2012  . Sepsis ([place]) 03/16/2014  . Sinus pause 05/17/2019   Sinus pauses  . SIRS (systemic inflammatory response syndrome) ([place]) 07/11/2018  . Syncope and collapse 03/03/2019  . 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 Thyroid nodule 03/05/2016   See CT chest 02/25/16 -  u/s thyroid 03/04/16 > Pos L nodules > both Bx [id number] > BENIGN FOLLICULAR NODULE  . Unspecified essential hypertension 05/04/2013    Past Surgical History:  Procedure Laterality Date  . ABDOMINAL HYSTERECTOMY    . CHOLECYSTECTOMY N/A 05/04/2013   Procedure: LAPAROSCOPIC CHOLECYSTECTOMY WITH INTRAOPERATIVE CHOLANGIOGRAM;  Surgeon: Haywood Lasso, MD;  Location: [facility];  Service: General;  Laterality: N/A;  . ERCP N/A 05/02/2013   Procedure: ENDOSCOPIC RETROGRADE CHOLANGIOPANCREATOGRAPHY (ERCP);  Surgeon: Beryle Beams, MD;  Location: [facility];  Service: Endoscopy;  Laterality: N/A;  . JOINT REPLACEMENT     left knee  . NASAL SINUS SURGERY    . PACEMAKER IMPLANT N/A 06/11/2019   Procedure: PACEMAKER IMPLANT;  Surgeon: Deboraha Sprang, MD;  Location: [facility];  Service: Cardiovascular;  Laterality: N/A;  . SPHINCTEROTOMY  05/02/2013   Procedure: SPHINCTEROTOMY;  Surgeon: Beryle Beams, MD;  Location: [facility];  Service: Endoscopy;;  . TONSILLECTOMY    . TOTAL KNEE ARTHROPLASTY  08/01/2012   Procedure: TOTAL KNEE ARTHROPLASTY;  Surgeon: Meredith Pel, MD;  Location: [facility];  Service: Orthopedics;  Laterality: Right;  Right total knee arthroplasty  . TOTAL KNEE ARTHROPLASTY  08/01/2012   RIGHT  KNEE     reports that she has quit smoking. Her smoking use included cigarettes. She has a 0.50 pack-year smoking history. She has never used smokeless tobacco. She reports current alcohol use. She reports that she does not use drugs.  Allergies  Allergen Reactions  . Monopril [Fosinopril] Cough  . Magnesium-Containing Compounds Nausea Only and Other (See Comments)    Re: Mag-Ox at higher doses caused a lot of stomach upset (dose had to be decreased)  . Norvasc [Amlodipine Besylate] Other (See Comments)    10 mg dose caused LE edema- can tolerate 5 mg dose    Family History  Problem Relation Age of Onset  . Cancer Mother        breast  . Heart attack Mother   .  Stroke Father   . Diabetes Sister   . Diabetes Daughter     Prior to Admission medications   Medication Sig Start Date End Date Taking? Authorizing Provider  albuterol (VENTOLIN HFA) 108 (90 Base) MCG/ACT inhaler Inhale 2 puffs into the lungs every 4 (four) hours as needed for wheezing or shortness of breath. 12/10/19  Yes Ghimire, Henreitta Leber, MD  atorvastatin (LIPITOR) 10 MG tablet TAKE 1 TABLET BY MOUTH EVERY DAY Patient taking differently: Take 10 mg by mouth daily.  02/06/19  Yes Delsa Grana, PA-C  benzonatate (TESSALON) 200 MG capsule Take 1 capsule (200 mg total) by mouth 3 (three) times daily. 01/15/20  Yes [place], Modena Nunnery, MD  Calcium Carbonate-Vitamin D (CALCIUM 600 + D PO) Take 1 tablet by mouth 2 (two) times daily.   Yes [provider]  carvedilol (COREG) 12.5 MG tablet Take 1.5 tablets (18.75 mg total) by mouth 2 (two) times daily with a meal. 01/09/20  Yes Lorretta Harp, MD  chlorpheniramine-HYDROcodone [facility] PENNKINETIC ER) 10-8 MG/5ML SUER Take  5 mLs by mouth every 12 (twelve) hours as needed. Patient taking differently: Take 5 mLs by mouth every 12 (twelve) hours as needed for cough.  01/15/20  Yes [place], Modena Nunnery, MD  diclofenac sodium (VOLTAREN) 1 % GEL Apply 3 g topically daily as needed (to painful sites (in 3 large joints)).    Yes [provider]  diltiazem (CARDIZEM CD) 360 MG 24 hr capsule TAKE 1 CAPSULE BY MOUTH EVERY DAY Patient taking differently: Take 360 mg by mouth daily.  08/27/19  Yes [place], Modena Nunnery, MD  ELIQUIS 5 MG TABS tablet TAKE 1 TABLET BY MOUTH TWICE A DAY Patient taking differently: Take 5 mg by mouth 2 (two) times daily.  11/23/19  Yes Lorretta Harp, MD  estradiol (ESTRACE) 0.5 MG tablet Take 0.5 mg by mouth 2 (two) times a day.   Yes [provider]  etanercept (ENBREL SURECLICK) 50 MG/ML injection INJECT 50 MG (0.98 ML) UNDER THE SKIN ONCE A WEEK Patient taking differently: Inject 50 mg into the skin once a week.  Thursdays (0.98 ML) 11/28/19  Yes Deveshwar, Abel Presto, MD  fish oil-omega-3 fatty acids 1000 MG capsule Take 1 g by mouth at bedtime.    Yes [provider]  folic acid (FOLVITE) 1 MG tablet TAKE 1 TABLET BY MOUTH TWICE A DAY Patient taking differently: Take 1 mg by mouth 2 (two) times daily.  09/21/19  Yes [place], Modena Nunnery, MD  furosemide (LASIX) 40 MG tablet Take 1 tablet (40 mg total) by mouth 2 (two) times daily. 01/09/20  Yes Lorretta Harp, MD  leflunomide (ARAVA) 10 MG tablet TAKE 1 TABLET BY MOUTH EVERY DAY Patient taking differently: Take 10 mg by mouth daily.  09/20/19  Yes Deveshwar, Abel Presto, MD  levofloxacin (LEVAQUIN) 500 MG tablet Take 1 tablet (500 mg total) by mouth daily. 01/14/20  Yes [place], Modena Nunnery, MD  levothyroxine (SYNTHROID) 50 MCG tablet TAKE 1 TABLET BY MOUTH DAILY BEFORE BREAKFAST Patient taking differently: Take 50 mcg by mouth daily before breakfast.  01/14/20  Yes [place], Modena Nunnery, MD  losartan (COZAAR) 50 MG tablet Take 1 tablet (50 mg total) by mouth daily. 09/11/19  Yes Deboraha Sprang, MD  magnesium oxide (MAG-OX) 400 MG tablet Take 1,200 mg by mouth 2 (two) times daily.  11/22/19  Yes [provider]  metFORMIN (GLUCOPHAGE) 500 MG tablet Take 2 tablets (1,000 mg total) by mouth 2 (two) times daily with a meal. 12/27/19  Yes [place], Modena Nunnery, MD  methocarbamol (ROBAXIN) 500 MG tablet Take 1 tablet (500 mg total) by mouth daily as needed for muscle spasms. Patient taking differently: Take 500 mg by mouth as needed for muscle spasms.  02/28/19  Yes Ofilia Neas, PA-C  Multiple Vitamins-Minerals (MULTIVITAMINS THER. W/MINERALS) TABS Take 1 tablet by mouth daily.   Yes [provider]  ondansetron (ZOFRAN) 4 MG tablet Take 1 tablet (4 mg total) by mouth every 8 (eight) hours as needed for nausea or vomiting. 01/15/20  Yes [place], Modena Nunnery, MD  pantoprazole (PROTONIX) 40 MG tablet TAKE 1 TABLET BY MOUTH EVERY DAY Patient taking differently: Take 40  mg by mouth daily.  01/15/20  Yes [place], Modena Nunnery, MD  potassium chloride (KLOR-CON) 10 MEQ tablet Take 1 tablet (10 mEq total) by mouth daily. 10/03/19  Yes Deboraha Sprang, MD  vitamin B-12 (CYANOCOBALAMIN) 500 MCG tablet Take 500 mcg by mouth daily.   Yes [provider]    Physical Exam: Constitutional:  Moderately built and nourished. Vitals:   01/16/20 2128 01/17/20 0130 01/17/20 0200 01/17/20 0230  BP: 113/73 (!) 147/118 (!) 116/91 110/65  Pulse: 95  (!) 102 (!) 112  Resp: 18 16 15 16   Temp: 98.4 F (36.9 C)     TempSrc: Oral     SpO2:  93% 92% 92%   Eyes: Anicteric no pallor. ENMT: No discharge from the ears eyes nose or mouth. Neck: No JVD appreciated no mass felt. Respiratory: No rhonchi or crepitations. Cardiovascular: S1-S2 heard. Abdomen: Soft nontender bowel sound present. Musculoskeletal: Bilateral lower extremity edema present. Skin: No rash. Neurologic: Alert awake oriented to time place and person.  Moves all extremities. Psychiatric: Appears normal per normal affect.   Labs on Admission: I have personally reviewed following labs and imaging studies  CBC: Recent Labs  Lab 01/16/20 2344  WBC 9.3  NEUTROABS 6.3  HGB 10.5*  HCT 33.9*  MCV 96.6  PLT [id number]   Basic Metabolic Panel: Recent Labs  Lab 01/14/20 1003 01/16/20 2344  NA 139 136  K 4.6 4.2  CL 97* 100  CO2 27 28  GLUCOSE 143* 103*  BUN 22 21  CREATININE 1.40* 1.63*  CALCIUM 8.1* 6.9*   GFR: Estimated Creatinine Clearance: 27.4 mL/min (A) (by C-G formula based on SCr of 1.63 mg/dL (H)). Liver Function Tests: Recent Labs  Lab 01/16/20 2344  AST 19  ALT 15  ALKPHOS 88  BILITOT 0.6  PROT 6.0*  ALBUMIN 1.7*   No results for input(s): LIPASE, AMYLASE in the last 168 hours. No results for input(s): AMMONIA in the last 168 hours. Coagulation Profile: No results for input(s): INR, PROTIME in the last 168 hours. Cardiac Enzymes: No results for input(s): CKTOTAL, CKMB,  CKMBINDEX, TROPONINI in the last 168 hours. BNP (last 3 results) No results for input(s): PROBNP in the last 8760 hours. HbA1C: No results for input(s): HGBA1C in the last 72 hours. CBG: No results for input(s): GLUCAP in the last 168 hours. Lipid Profile: No results for input(s): CHOL, HDL, LDLCALC, TRIG, CHOLHDL, LDLDIRECT in the last 72 hours. Thyroid Function Tests: No results for input(s): TSH, T4TOTAL, FREET4, T3FREE, THYROIDAB in the last 72 hours. Anemia Panel: No results for input(s): VITAMINB12, FOLATE, FERRITIN, TIBC, IRON, RETICCTPCT in the last 72 hours. Urine analysis:    Component Value Date/Time   COLORURINE AMBER (A) 12/03/2019 1944   APPEARANCEUR HAZY (A) 12/03/2019 1944   LABSPEC 1.025 12/03/2019 1944   PHURINE 5.0 12/03/2019 1944   GLUCOSEU NEGATIVE 12/03/2019 1944   HGBUR NEGATIVE 12/03/2019 1944   BILIRUBINUR NEGATIVE 12/03/2019 1944   KETONESUR NEGATIVE 12/03/2019 1944   PROTEINUR 100 (A) 12/03/2019 1944   UROBILINOGEN 1.0 03/16/2014 0628   NITRITE NEGATIVE 12/03/2019 1944   LEUKOCYTESUR MODERATE (A) 12/03/2019 1944   Sepsis Labs: @LABRCNTIP (procalcitonin:4,lacticidven:4) )No results found for this or any previous visit (from the past 240 hour(s)).   Radiological Exams on Admission: DG Chest 2 View  Result Date: 01/16/2020 CLINICAL DATA:  Cough. EXAM: CHEST - 2 VIEW COMPARISON:  Most recent radiograph 2 days ago 01/14/2020, additional priors. FINDINGS: Left-sided pacemaker remains in place. Similar appearance of moderate size left pleural effusion and adjacent basilar opacity. Worsening patchy opacity in the right mid lower lung zone. Suspected small right pleural effusion. Unchanged heart size and mediastinal contours, left heart border partially obscured by adjacent pleural effusion. No pneumothorax. Chronic change of the left shoulder. IMPRESSION: 1. Similar appearance of moderate size left pleural effusion and adjacent basilar opacity  over the past 2  days. 2. Worsening patchy opacity in the right mid and lower lung zone, suspicious for pneumonia. Possible small right pleural effusion. Electronically Signed   By: Keith Rake M.D.   On: 01/16/2020 22:43    EKG: Independently reviewed.  A. fib with RVR.  Assessment/Plan Principal Problem:   Acute respiratory failure with hypoxia (HCC) Active Problems:   Essential hypertension, benign   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 without organ or systems involvement (HCC)   Atrial fibrillation (HCC)   Hypothyroidism   Tachy-brady syndrome ([place])   Community acquired pneumonia of right lung    1. Acute respiratory failure with hypoxia likely a combination of CHF and Pneumonia. 2. Acute CHF last EF was 60 to 65% in July 2020 I think patient's worsening CHF symptoms are likely from A. fib with RVR.  Pneumonia may be contributing.  Patient received Lasix 40 mg IV further doses will be decided based on the patient's renal function and respiratory status. 3. Possible pneumonia on empiric antibiotic check procalcitonin.  Has had recent COVID-19 infection last month. 4. A. fib with RVR likely contributing to the above symptoms.  I am ordering patient's Coreg and Cardizem.  If it does not control the heart rate may start Cardizem infusion.  On apixaban. 5. Acute renal failure could be from recent increase in Lasix.  Will hold patient's ARB because of the acute renal failure.  Check UA closely monitor intake output and metabolic panel. 6. History of tachybradycardia syndrome status post pacemaker placement. 7. Hypertension for which I am holding off patient's ARB.  Continue Cardizem and beta-blockers.  Closely monitor blood pressure trends. 8. History of rheumatoid arthritis already hold off [place] for now due to possible pneumonia.  If procalcitonin is negative may restart it. 9. Anemia appears to be new follow CBC.  10. Hypothyroidism on Synthroid. 11. Recent COVID-19  infection.  Given the acute hypoxic respiratory failure patient will need close monitoring and inpatient status.   DVT prophylaxis: Apixaban. Code Status: Full code. Family Communication: Discussed with patient. Disposition Plan: Home. Consults called: Cardiology. Admission status: Inpatient.   Rise Patience MD [facility] Pager [phone].  If 7PM-7AM, please contact night-coverage [email or web address] Password [facility]  01/17/2020, 2:52 AM

## 2020-01-17 NOTE — Progress Notes (Signed)
[place] for heparin  Indication: atrial fibrillation  Allergies  Allergen Reactions  . Monopril [Fosinopril] Cough  . Magnesium-Containing Compounds Nausea Only and Other (See Comments)    Re: Mag-Ox at higher doses caused a lot of stomach upset (dose had to be decreased)  . Norvasc [Amlodipine Besylate] Other (See Comments)    10 mg dose caused LE edema- can tolerate 5 mg dose    Patient Measurements: Height: 4\' 10"  (147.3 cm) Weight: 83 kg (182 lb 14.4 oz) IBW/kg (Calculated) : 40.9   Vital Signs: Temp: 98 F (36.7 C) (05/27 1152) Temp Source: Oral (05/27 1152) BP: 114/88 (05/27 0351) Pulse Rate: 105 (05/27 1152)  Labs: Recent Labs    01/16/20 2344 01/17/20 0356 01/17/20 0707  HGB 10.5*  --  10.1*  HCT 33.9*  --  32.2*  PLT 263  --  265  CREATININE 1.63*  --  1.62*  TROPONINIHS  --  7 6    Estimated Creatinine Clearance: 27.3 mL/min (A) (by C-G formula based on SCr of 1.62 mg/dL (H)).   Medical History: Past Medical History:  Diagnosis Date  . Abrasion of skin with infection 05/09/2013  . Acute blood loss anemia 08/05/2012  . Anemia 11/21/2013  . Arthritis    osteoarthritis  . Atrial fibrillation (HCC)    Paroxysmal atrial fibrillation  . Bursitis of right shoulder   . Chronic cholecystitis with calculus 05/04/2013  . 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 Diabetes type 2, controlled ([place]) 05/04/2013  . Essential hypertension, benign 10/14/2012   Essential hypertension  . Fatty liver disease, nonalcoholic [id number]   hospitalized, MRCP dx fatty liver  . Gastroenteritis 10/16/2013  . GERD (gastroesophageal reflux disease)   . History of hypertension 08/20/2016  . History of total bilateral knee replacement 06/22/2017   Right= 2013. Left=2003  . Hormone replacement therapy (postmenopausal)   . 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 Hyperlipidemia   . Hypertension   . Hypertensive urgency  07/11/2018  . Hypokalemia 07/11/2018  . Hypomagnesemia 07/21/2016  . Hypothyroidism 03/28/2019  . Malnutrition of moderate degree ([place]) 03/18/2014  . Morbid obesity due to excess calories ([place]) 12/27/2012   Body mass index is 37.16 kg/m.  -  trending up  Lab Results Component Value Date  TSH 1.520 10/15/2013    Contributing to gerd risk/ doe/reviewed the need and the process to achieve and maintain neg calorie balance > defer f/u primary care including intermittently monitoring thyroid status    . Multiple pulmonary nodules 04/04/2014   Followed in Pulmonary clinic/ [facility]/ [place] - See CT chest 03/17/14 > not viz on abd CT from 10/16/13 as did not go high enough on lung slices >>>in tickle file for recall 06/17/14  - Spirometry 04/04/14 wnl sp smoking cessation 02/2014 - 06/26/2014 Right middle lobe 1.0 cm pulmonary nodule is similar over the past 3 months. This remains indeterminate.   - CT 12/25/14  Pulmonary nodules are st  . Nausea & vomiting 07/11/2018  . Normal cardiac stress test    pt can't remember when or where  . Obesity, unspecified 12/27/2012  . Osteoarthritis of right knee 08/05/2012  . 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 without organ or systems involvement ([place])   . Rheumatoid arthritis(714.0) 09/28/2012  . Sepsis ([place]) 03/16/2014  . Sinus pause 05/17/2019   Sinus pauses  . SIRS (systemic inflammatory response syndrome) ([place]) 07/11/2018  . Syncope and  collapse 03/03/2019  . 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 Thyroid nodule 03/05/2016   See CT chest 02/25/16 - u/s thyroid 03/04/16 > Pos L nodules > both Bx [id number] > BENIGN FOLLICULAR NODULE  . Unspecified essential hypertension 05/04/2013    Medications:  Medications Prior to Admission  Medication Sig Dispense Refill Last Dose  . albuterol (VENTOLIN HFA) 108 (90 Base) MCG/ACT inhaler Inhale 2 puffs into the lungs every 4 (four) hours as needed for wheezing or shortness of breath. 6.7 g 0  01/16/2020 at Unknown time  . atorvastatin (LIPITOR) 10 MG tablet TAKE 1 TABLET BY MOUTH EVERY DAY (Patient taking differently: Take 10 mg by mouth daily. ) 90 tablet 3 01/16/2020 at Unknown time  . benzonatate (TESSALON) 200 MG capsule Take 1 capsule (200 mg total) by mouth 3 (three) times daily. 20 capsule 0 01/16/2020 at Unknown time  . Calcium Carbonate-Vitamin D (CALCIUM 600 + D PO) Take 1 tablet by mouth 2 (two) times daily.   01/16/2020 at Unknown time  . carvedilol (COREG) 12.5 MG tablet Take 1.5 tablets (18.75 mg total) by mouth 2 (two) times daily with a meal. 180 tablet 2 01/16/2020 at 1000  . chlorpheniramine-HYDROcodone (TUSSIONEX PENNKINETIC ER) 10-8 MG/5ML SUER Take 5 mLs by mouth every 12 (twelve) hours as needed. (Patient taking differently: Take 5 mLs by mouth every 12 (twelve) hours as needed for cough. ) 140 mL 0 01/16/2020 at Unknown time  . diclofenac sodium (VOLTAREN) 1 % GEL Apply 3 g topically daily as needed (to painful sites (in 3 large joints)).    Past Month at Unknown time  . diltiazem (CARDIZEM CD) 360 MG 24 hr capsule TAKE 1 CAPSULE BY MOUTH EVERY DAY (Patient taking differently: Take 360 mg by mouth daily. ) 90 capsule 1 01/16/2020 at Unknown time  . ELIQUIS 5 MG TABS tablet TAKE 1 TABLET BY MOUTH TWICE A DAY (Patient taking differently: Take 5 mg by mouth 2 (two) times daily. ) 180 tablet 1 01/16/2020 at 1000  . estradiol (ESTRACE) 0.5 MG tablet Take 0.5 mg by mouth 2 (two) times a day.   01/16/2020 at Unknown time  . etanercept (ENBREL SURECLICK) 50 MG/ML injection INJECT 50 MG (0.98 ML) UNDER THE SKIN ONCE A WEEK (Patient taking differently: Inject 50 mg into the skin once a week. Thursdays (0.98 ML)) 12 pen 0 01/10/2020 at Unknown time  . fish oil-omega-3 fatty acids 1000 MG capsule Take 1 g by mouth at bedtime.    01/15/2020 at Unknown time  . folic acid (FOLVITE) 1 MG tablet TAKE 1 TABLET BY MOUTH TWICE A DAY (Patient taking differently: Take 1 mg by mouth 2 (two) times daily.  ) 180 tablet 1 01/16/2020 at Unknown time  . furosemide (LASIX) 40 MG tablet Take 1 tablet (40 mg total) by mouth 2 (two) times daily. 180 tablet 2 01/16/2020 at Unknown time  . leflunomide (ARAVA) 10 MG tablet TAKE 1 TABLET BY MOUTH EVERY DAY (Patient taking differently: Take 10 mg by mouth daily. ) 90 tablet 0 01/16/2020 at Unknown time  . levofloxacin (LEVAQUIN) 500 MG tablet Take 1 tablet (500 mg total) by mouth daily. 7 tablet 0 01/16/2020 at Unknown time  . levothyroxine (SYNTHROID) 50 MCG tablet TAKE 1 TABLET BY MOUTH DAILY BEFORE BREAKFAST (Patient taking differently: Take 50 mcg by mouth daily before breakfast. ) 90 tablet 0 01/16/2020 at Unknown time  . losartan (COZAAR) 50 MG tablet Take 1 tablet (50 mg total) by mouth daily.  90 tablet 3 01/16/2020 at Unknown time  . magnesium oxide (MAG-OX) 400 MG tablet Take 1,200 mg by mouth 2 (two) times daily.    01/16/2020 at Unknown time  . metFORMIN (GLUCOPHAGE) 500 MG tablet Take 2 tablets (1,000 mg total) by mouth 2 (two) times daily with a meal. 120 tablet 3 01/16/2020 at Unknown time  . methocarbamol (ROBAXIN) 500 MG tablet Take 1 tablet (500 mg total) by mouth daily as needed for muscle spasms. (Patient taking differently: Take 500 mg by mouth as needed for muscle spasms. ) 30 tablet 0 Past Week at Unknown time  . Multiple Vitamins-Minerals (MULTIVITAMINS THER. W/MINERALS) TABS Take 1 tablet by mouth daily.   01/16/2020 at Unknown time  . ondansetron (ZOFRAN) 4 MG tablet Take 1 tablet (4 mg total) by mouth every 8 (eight) hours as needed for nausea or vomiting. 20 tablet 0 01/16/2020 at Unknown time  . pantoprazole (PROTONIX) 40 MG tablet TAKE 1 TABLET BY MOUTH EVERY DAY (Patient taking differently: Take 40 mg by mouth daily. ) 90 tablet 1 01/16/2020 at Unknown time  . potassium chloride (KLOR-CON) 10 MEQ tablet Take 1 tablet (10 mEq total) by mouth daily. 90 tablet 3 01/16/2020 at Unknown time  . vitamin B-12 (CYANOCOBALAMIN) 500 MCG tablet Take 500 mcg by  mouth daily.   01/16/2020 at Unknown time   Scheduled:  . atorvastatin  10 mg Oral q1800  . carvedilol  18.75 mg Oral BID WC  . diltiazem  360 mg Oral Daily  . folic acid  1 mg Oral BID  . furosemide  20 mg Intravenous Daily  . levothyroxine  50 mcg Oral QAC breakfast  . magnesium oxide  1,200 mg Oral BID  . pantoprazole  40 mg Oral Daily  . vitamin B-12  500 mcg Oral Daily    Assessment: 74 yo female with afib on apixaban (last dose was 5/27 at ~ 4am). Pharmacy consulted to dose heparin. Plans noted for possible TEE/DCCV. -Hg= 10.1, SCr= 1.62 (baseline ~ 1.0)   Goal of Therapy:  Heparin level 0.3-0.7 units/ml aPTT 66-102 seconds Monitor platelets by anticoagulation protocol: Yes   Plan:  -Start heparin at 1150 units/hr at 4:30pm -aPTT in 8 hours -heparin level, aPTT, heparin level and CBC  Hildred Laser, PharmD Clinical Pharmacist **Pharmacist phone directory can now be found on [email or web address] ([email or web address]).  Listed under [place].

## 2020-01-17 NOTE — Telephone Encounter (Signed)
Call placed to patient to follow up.   Was advised that she did go to ER for her breathing last night.   Patient has been admitted to [facility].

## 2020-01-17 NOTE — Progress Notes (Signed)
Patient refused scheduled  PO magnesium stating "it gives me diarrhea". Encouraged and educated pt, but still refused.

## 2020-01-17 NOTE — Progress Notes (Addendum)
PROGRESS NOTE  [name] [id number] DOB: [birth year]   PCP: Alycia Rossetti, MD  Patient is from: Home. Lives with daughter and grandchildren. Uses walker since COVID-19 infection.  DOA: 01/16/2020 LOS: 0  Brief Narrative / Interim history: 74 year old female with history of SSS/PPM, A. fib, DM-2, respiratory failure on 2 L after COVID-19 infection about a month ago. She presented with progressive shortness of breath for 1 week despite increased dose of Lasix by her cardiologist and Levaquin by PCP. He also had associated chest pressure.  In ED, HR in 120s. 95% on 3 L. Signs of fluid overload with JVD and BLE edema. CXR consistent with CHF, moderate left pleural effusion and possible right lung infiltrate. BNP elevated to 526. Cr 1.6. EKG A. fib with mild RVR. Received IV Lasix, empiric antibiotics for pneumonia and admitted for acute on chronic CHF, possible pneumonia and A. fib. Cardiology consulted.  Subjective: Seen and examined later this afternoon. Reports some improvement in her breathing. Still with lower extremity edema. Denies chest pain, GI or UTI symptoms. Denies fever or chills.  Objective: Vitals:   01/17/20 0351 01/17/20 0351 01/17/20 0727 01/17/20 1152  BP:  114/88 95/60 120/74  Pulse:  82 (!) 102 (!) 105  Resp:  20 20 20   Temp:  98.3 F (36.8 C) (!) 97.3 F (36.3 C) 98 F (36.7 C)  TempSrc:   Oral Oral  SpO2:  94% 91% 92%  Weight: 83 kg     Height: 4\' 10"  (1.473 m)       Intake/Output Summary (Last 24 hours) at 01/17/2020 1514 Last data filed at 01/17/2020 1345 Gross per 24 hour  Intake 220 ml  Output 750 ml  Net -530 ml   Filed Weights   01/17/20 0351  Weight: 83 kg    Examination:  GENERAL: No apparent distress.  Nontoxic. HEENT: MMM.  Vision and hearing grossly intact.  NECK: Supple. Some JVD noted. RESP: On 2 L. No IWOB.  Fair aeration bilaterally. CVS: Irregular rhythm at 110. Heart sounds normal.  ABD/GI/GU: BS+. Abd soft, NTND.    MSK/EXT:  Moves extremities. No apparent deformity. 2+ pitting BLE edema. SKIN: no apparent skin lesion or wound NEURO: Awake, alert and oriented appropriately.  No apparent focal neuro deficit. PSYCH: Calm. Normal affect.  Procedures:  None  Microbiology summarized: None  Assessment & Plan: Acute on chronic diastolic CHF: Echo in 0000000 with EF of 60 to 65%. 2+ BLE edema with some JVD. Failed outpatient treatment.  -On IV Lasix 20 mg daily.  I believe she would benefit from a higher dose given his CKD but defer this to cardiology. -Monitor fluid status, renal function and electrolytes -Sodium and fluid restriction -She is anxious about TED hose due to arthritis. Recommended leg elevation.  Possible pneumonia: Clinically low suspicion but some radiologic evidence of right lung infiltrate.  This was also present on his CXR on 5/18 and 5/24.  Procalcitonin negative but not sure if it is reliable in this patient on [place].  She has no fever or leukocytosis. -Continue ceftriaxone and azithromycin  Bilateral pleural effusion, moderate on the left and small on the right-likely due to CHF -Ordered IR thoracocentesis with fluid study -Diuretics as above  Persistent A. fib with RVR: Still with mild RVR. -Continue Cardizem, Coreg and IV heparin per cardiology.  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 BUN normal.-She had ATN when she had a COVID-19 last month. Not on nephrotoxic  meds other than Lasix. -Continue monitoring -Minimize nephrotoxic meds  Chronic respiratory failure due to COVID-19 infection-was discharged on 2 L last month. -Wean oxygen as able  History of tachybradycardia syndrome s/p PPM -Per cardiology  Recent COVID-19 infection: Tested positive on 4/12. -No isolation/precaution indicated  Rheumatoid arthritis: On Arava at home. -Arava on hold out of concern for pneumonia  Anemia of chronic disease: Recent Baseline Hgb 10-11> 10.5  (admit)> 10.1 -Check anemia panel -Continue monitoring  Hypothyroidism -Continue home Synthroid  Debility: Has been using walker since discharge from hospital after COVID-19 hospitalization. -PT/OT eval            DVT prophylaxis: On heparin drip for A. fib Code Status: Full code Family Communication: Patient and/or RN. Available if any question.  Status is: Inpatient  Remains inpatient appropriate because:Hemodynamically unstable, Ongoing diagnostic testing needed not appropriate for outpatient work up and IV treatments appropriate due to intensity of illness or inability to take PO   Dispo: The patient is from: Home              Anticipated d/c is to: Home              Anticipated d/c date is: 3 days              Patient currently is not medically stable to d/c.        Consultants:  Cardiology IR   Sch Meds:  Scheduled Meds: . atorvastatin  10 mg Oral q1800  . carvedilol  18.75 mg Oral BID WC  . diltiazem  360 mg Oral Daily  . folic acid  1 mg Oral BID  . furosemide  20 mg Intravenous Daily  . levothyroxine  50 mcg Oral QAC breakfast  . magnesium oxide  1,200 mg Oral BID  . pantoprazole  40 mg Oral Daily  . vitamin B-12  500 mcg Oral Daily   Continuous Infusions: . azithromycin    . cefTRIAXone (ROCEPHIN)  IV    . heparin     PRN Meds:.acetaminophen, ondansetron **OR** ondansetron (ZOFRAN) IV  Antimicrobials: Anti-infectives (From admission, onward)   Start     Dose/Rate Route Frequency Ordered Stop   01/17/20 2200  cefTRIAXone (ROCEPHIN) 2 g in sodium chloride 0.9 % 100 mL IVPB     2 g 200 mL/hr over 30 Minutes Intravenous Every 24 hours 01/17/20 0251 01/22/20 2159   01/17/20 2200  azithromycin (ZITHROMAX) 500 mg in sodium chloride 0.9 % 250 mL IVPB     500 mg 250 mL/hr over 60 Minutes Intravenous Every 24 hours 01/17/20 0251 01/22/20 2159   01/17/20 0030  cefTRIAXone (ROCEPHIN) 1 g in sodium chloride 0.9 % 100 mL IVPB     1 g 200 mL/hr over 30  Minutes Intravenous  Once 01/17/20 0016 01/17/20 0223   01/17/20 0030  azithromycin (ZITHROMAX) 500 mg in sodium chloride 0.9 % 250 mL IVPB     500 mg 250 mL/hr over 60 Minutes Intravenous  Once 01/17/20 0016 01/17/20 0323       I have personally reviewed the following labs and images: CBC: Recent Labs  Lab 01/16/20 2344 01/17/20 0707  WBC 9.3 6.2  NEUTROABS 6.3 5.3  HGB 10.5* 10.1*  HCT 33.9* 32.2*  MCV 96.6 95.5  PLT 263 265   BMP &GFR Recent Labs  Lab 01/14/20 1003 01/16/20 2344 01/17/20 0707  NA 139 136 138  K 4.6 4.2 4.8  CL 97* 100 97*  CO2 27 28 26  GLUCOSE 143* 103* 166*  BUN 22 21 21   CREATININE 1.40* 1.63* 1.62*  CALCIUM 8.1* 6.9* 7.0*   Estimated Creatinine Clearance: 27.3 mL/min (A) (by C-G formula based on SCr of 1.62 mg/dL (H)). Liver & Pancreas: Recent Labs  Lab 01/16/20 2344 01/17/20 0707  AST 19 29  ALT 15 14  ALKPHOS 88 85  BILITOT 0.6 1.3*  PROT 6.0* 5.9*  ALBUMIN 1.7* 1.6*   No results for input(s): LIPASE, AMYLASE in the last 168 hours. No results for input(s): AMMONIA in the last 168 hours. Diabetic: No results for input(s): HGBA1C in the last 72 hours. No results for input(s): GLUCAP in the last 168 hours. Cardiac Enzymes: No results for input(s): CKTOTAL, CKMB, CKMBINDEX, TROPONINI in the last 168 hours. No results for input(s): PROBNP in the last 8760 hours. Coagulation Profile: No results for input(s): INR, PROTIME in the last 168 hours. Thyroid Function Tests: Recent Labs    01/17/20 0356  TSH 5.118*   Lipid Profile: No results for input(s): CHOL, HDL, LDLCALC, TRIG, CHOLHDL, LDLDIRECT in the last 72 hours. Anemia Panel: No results for input(s): VITAMINB12, FOLATE, FERRITIN, TIBC, IRON, RETICCTPCT in the last 72 hours. Urine analysis:    Component Value Date/Time   COLORURINE AMBER (A) 12/03/2019 1944   APPEARANCEUR HAZY (A) 12/03/2019 1944   LABSPEC 1.025 12/03/2019 1944   PHURINE 5.0 12/03/2019 1944   GLUCOSEU  NEGATIVE 12/03/2019 1944   HGBUR NEGATIVE 12/03/2019 1944   BILIRUBINUR NEGATIVE 12/03/2019 1944   KETONESUR NEGATIVE 12/03/2019 1944   PROTEINUR 100 (A) 12/03/2019 1944   UROBILINOGEN 1.0 03/16/2014 0628   NITRITE NEGATIVE 12/03/2019 1944   LEUKOCYTESUR MODERATE (A) 12/03/2019 1944   Sepsis Labs: Invalid input(s): PROCALCITONIN, [place]  Microbiology: No results found for this or any previous visit (from the past 240 hour(s)).  Radiology Studies: DG Chest 2 View  Result Date: 01/16/2020 CLINICAL DATA:  Cough. EXAM: CHEST - 2 VIEW COMPARISON:  Most recent radiograph 2 days ago 01/14/2020, additional priors. FINDINGS: Left-sided pacemaker remains in place. Similar appearance of moderate size left pleural effusion and adjacent basilar opacity. Worsening patchy opacity in the right mid lower lung zone. Suspected small right pleural effusion. Unchanged heart size and mediastinal contours, left heart border partially obscured by adjacent pleural effusion. No pneumothorax. Chronic change of the left shoulder. IMPRESSION: 1. Similar appearance of moderate size left pleural effusion and adjacent basilar opacity over the past 2 days. 2. Worsening patchy opacity in the right mid and lower lung zone, suspicious for pneumonia. Possible small right pleural effusion. Electronically Signed   By: Keith Rake M.D.   On: 01/16/2020 22:43    Additional 35 minutes with more than 50% spent in reviewing records, counseling patient/family and coordinating care.  Tamberly Pomplun T. [place]  If 7PM-7AM, please contact night-coverage [email or web address] Password [facility] 01/17/2020, 3:14 PM

## 2020-01-17 NOTE — Consult Note (Signed)
Cardiology Consultation:   Patient ID: [name]; [id number]; [birth year]   Admit date: 01/16/2020 Date of Consult: 01/17/2020  Primary Care Provider: Alycia Rossetti, MD Primary Cardiologist: Dr. [name], MD   Patient Profile:   [name] is a 74 y.o. 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 Hal Hope.  History of Present Illness:   [name] is a 74yo F with a hx as stated above who presented to [facility] on 01/16[facility]20 with progressive SOB and lower extremity edema. She was admitted to the hospital 12/03/19 with COVID-19 PNA and was discharged one week later. She was continued on home supplemental oxygen due to persistent hypoxia and reports she never felt recovered after her hospitalization. She denies chest pain, palpitations, dizziness or syncope. She has significant LE edema for which she states has been present for the last several months. Her most recent baseline has been very sedentary due to her poor respiratory status.   She is followed by Dr. [name] for her cardiology care. She was most recently seen 01/09/20 at which time she was found to be in AF with RVR with a rate in the 130 range. Given this, her carvedilol was increased from 12.5 to 18.75 twice daily with plans to follow in the AF clinic>>may need OP DCCV.   She then saw Roderic Palau, NP on 01/14/20. At that time, she had a persistent cough which was felt to be exacerbated by her AF. CXR was obtained which showed development of moderate to large left pleural effusion with atelectasis and/or consolidation in the left lung base, patchy areas of airspace consolidation throughout the right mid to lower lung concerning for multilobar pneumonia. Plan was to hold off on DCCV if CXR with PNA. CXR results forwarded to PCP to  determine plan. She was ultimately started in [place]. Despite taking these medications, she has had SOB therefore she proceeded to the ED for further evaluation.      In the ED, CXR showed similar appearance of moderate size left pleural effusion and adjacent basilar opacity over the past 2 days with worsening patchy opacity in the right mid and lower lung zone, suspicious for pneumonia. Possible small right pleural effusion. Creatinine was found to be elevated above baseline at 1.6. BNP up at 526. She was given IV Lasix 40mg x1 and started on empiric abx for the treatment of PNA.   [name] had an echocardiogram 03/04/2019 which showed normal LVEF at 60-65% with some degree of DD and mild AR. She had a Lexiscan stress test 03/29/2019 that was normal with no evidence of ischemia or infarct. She wore a ZIO monitor 05/03/2019 which showed NSR/SB, PVCs and episodes of non-sustatined ventricular tachycardia and pauses up to 4 seconds in duration. She was then referred to Dr. Caryl Comes who placed a PPM on 06/04/2019. She has had no recurrent dizziness episodes since that time.   Past Medical History:  Diagnosis Date  . Abrasion of skin with infection 05/09/2013  . Acute blood loss anemia 08/05/2012  . Anemia 11/21/2013  . Arthritis    osteoarthritis  . Atrial fibrillation (HCC)    Paroxysmal atrial fibrillation  . Bursitis of right shoulder   . Chronic cholecystitis with calculus 05/04/2013  . Diabetes mellitus without complication ([place])  Type 2 NIDDM x 15 years  . Diabetes type 2, controlled ([place]) 05/04/2013  . Essential hypertension, benign 10/14/2012   Essential hypertension  . Fatty liver disease, nonalcoholic [id number]   hospitalized, MRCP dx fatty liver  . Gastroenteritis 10/16/2013  . GERD (gastroesophageal reflux disease)   . History of hypertension 08/20/2016  . History of total bilateral knee replacement 06/22/2017   Right= 2013. Left=2003  . Hormone replacement therapy (postmenopausal)   . 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 Hyperlipidemia   . Hypertension   . Hypertensive urgency 07/11/2018  . Hypokalemia 07/11/2018  . Hypomagnesemia 07/21/2016  . Hypothyroidism 03/28/2019  . Malnutrition of moderate degree ([place]) 03/18/2014  . Morbid obesity due to excess calories ([place]) 12/27/2012   Body mass index is 37.16 kg/m.  -  trending up  Lab Results Component Value Date  TSH 1.520 10/15/2013    Contributing to gerd risk/ doe/reviewed the need and the process to achieve and maintain neg calorie balance > defer f/u primary care including intermittently monitoring thyroid status    . Multiple pulmonary nodules 04/04/2014   Followed in Pulmonary clinic/ [facility]/ [place] - See CT chest 03/17/14 > not viz on abd CT from 10/16/13 as did not go high enough on lung slices >>>in tickle file for recall 06/17/14  - Spirometry 04/04/14 wnl sp smoking cessation 02/2014 - 06/26/2014 Right middle lobe 1.0 cm pulmonary nodule is similar over the past 3 months. This remains indeterminate.   - CT 12/25/14  Pulmonary nodules are st  . Nausea & vomiting 07/11/2018  . Normal cardiac stress test    pt can't remember when or where  . Obesity, unspecified 12/27/2012  . Osteoarthritis of right knee 08/05/2012  . 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 without organ or systems involvement ([place])   . Rheumatoid arthritis(714.0) 09/28/2012  . Sepsis ([place]) 03/16/2014  . Sinus pause 05/17/2019   Sinus pauses  . SIRS (systemic inflammatory response syndrome) ([place]) 07/11/2018  . Syncope and collapse 03/03/2019  . 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 Thyroid nodule 03/05/2016   See CT chest 02/25/16 - u/s thyroid 03/04/16 > Pos L nodules > both Bx [id number] > BENIGN FOLLICULAR NODULE  . Unspecified essential hypertension 05/04/2013    Past Surgical History:  Procedure Laterality Date  . ABDOMINAL HYSTERECTOMY    . CHOLECYSTECTOMY N/A 05/04/2013   Procedure: LAPAROSCOPIC CHOLECYSTECTOMY WITH INTRAOPERATIVE  CHOLANGIOGRAM;  Surgeon: Haywood Lasso, MD;  Location: [facility];  Service: General;  Laterality: N/A;  . ERCP N/A 05/02/2013   Procedure: ENDOSCOPIC RETROGRADE CHOLANGIOPANCREATOGRAPHY (ERCP);  Surgeon: Beryle Beams, MD;  Location: [facility];  Service: Endoscopy;  Laterality: N/A;  . JOINT REPLACEMENT     left knee  . NASAL SINUS SURGERY    . PACEMAKER IMPLANT N/A 06/11/2019   Procedure: PACEMAKER IMPLANT;  Surgeon: Deboraha Sprang, MD;  Location: [facility];  Service: Cardiovascular;  Laterality: N/A;  . SPHINCTEROTOMY  05/02/2013   Procedure: SPHINCTEROTOMY;  Surgeon: Beryle Beams, MD;  Location: [facility];  Service: Endoscopy;;  . TONSILLECTOMY    . TOTAL KNEE ARTHROPLASTY  08/01/2012   Procedure: TOTAL KNEE ARTHROPLASTY;  Surgeon: Meredith Pel, MD;  Location: [facility];  Service: Orthopedics;  Laterality: Right;  Right total knee arthroplasty  . TOTAL KNEE ARTHROPLASTY  08/01/2012   RIGHT  KNEE     Prior to Admission medications   Medication Sig Start Date End Date Taking? Authorizing Provider  albuterol (VENTOLIN  HFA) 108 (90 Base) MCG/ACT inhaler Inhale 2 puffs into the lungs every 4 (four) hours as needed for wheezing or shortness of breath. 12/10/19  Yes Ghimire, Henreitta Leber, MD  atorvastatin (LIPITOR) 10 MG tablet TAKE 1 TABLET BY MOUTH EVERY DAY Patient taking differently: Take 10 mg by mouth daily.  02/06/19  Yes Delsa Grana, PA-C  benzonatate (TESSALON) 200 MG capsule Take 1 capsule (200 mg total) by mouth 3 (three) times daily. 01/15/20  Yes [place], Modena Nunnery, MD  Calcium Carbonate-Vitamin D (CALCIUM 600 + D PO) Take 1 tablet by mouth 2 (two) times daily.   Yes [provider]  carvedilol (COREG) 12.5 MG tablet Take 1.5 tablets (18.75 mg total) by mouth 2 (two) times daily with a meal. 01/09/20  Yes Lorretta Harp, MD  chlorpheniramine-HYDROcodone (TUSSIONEX PENNKINETIC ER) 10-8 MG/5ML SUER Take 5 mLs by mouth every 12 (twelve) hours as  needed. Patient taking differently: Take 5 mLs by mouth every 12 (twelve) hours as needed for cough.  01/15/20  Yes [place], Modena Nunnery, MD  diclofenac sodium (VOLTAREN) 1 % GEL Apply 3 g topically daily as needed (to painful sites (in 3 large joints)).    Yes [provider]  diltiazem (CARDIZEM CD) 360 MG 24 hr capsule TAKE 1 CAPSULE BY MOUTH EVERY DAY Patient taking differently: Take 360 mg by mouth daily.  08/27/19  Yes [place], Modena Nunnery, MD  ELIQUIS 5 MG TABS tablet TAKE 1 TABLET BY MOUTH TWICE A DAY Patient taking differently: Take 5 mg by mouth 2 (two) times daily.  11/23/19  Yes Lorretta Harp, MD  estradiol (ESTRACE) 0.5 MG tablet Take 0.5 mg by mouth 2 (two) times a day.   Yes [provider]  etanercept (ENBREL SURECLICK) 50 MG/ML injection INJECT 50 MG (0.98 ML) UNDER THE SKIN ONCE A WEEK Patient taking differently: Inject 50 mg into the skin once a week. Thursdays (0.98 ML) 11/28/19  Yes Deveshwar, Abel Presto, MD  fish oil-omega-3 fatty acids 1000 MG capsule Take 1 g by mouth at bedtime.    Yes [provider]  folic acid (FOLVITE) 1 MG tablet TAKE 1 TABLET BY MOUTH TWICE A DAY Patient taking differently: Take 1 mg by mouth 2 (two) times daily.  09/21/19  Yes [place], Modena Nunnery, MD  furosemide (LASIX) 40 MG tablet Take 1 tablet (40 mg total) by mouth 2 (two) times daily. 01/09/20  Yes Lorretta Harp, MD  leflunomide (ARAVA) 10 MG tablet TAKE 1 TABLET BY MOUTH EVERY DAY Patient taking differently: Take 10 mg by mouth daily.  09/20/19  Yes Deveshwar, Abel Presto, MD  levofloxacin (LEVAQUIN) 500 MG tablet Take 1 tablet (500 mg total) by mouth daily. 01/14/20  Yes [place], Modena Nunnery, MD  levothyroxine (SYNTHROID) 50 MCG tablet TAKE 1 TABLET BY MOUTH DAILY BEFORE BREAKFAST Patient taking differently: Take 50 mcg by mouth daily before breakfast.  01/14/20  Yes [place], Modena Nunnery, MD  losartan (COZAAR) 50 MG tablet Take 1 tablet (50 mg total) by mouth daily. 09/11/19  Yes Deboraha Sprang, MD  magnesium oxide (MAG-OX) 400 MG tablet Take 1,200 mg by mouth 2 (two) times daily.  11/22/19  Yes [provider]  metFORMIN (GLUCOPHAGE) 500 MG tablet Take 2 tablets (1,000 mg total) by mouth 2 (two) times daily with a meal. 12/27/19  Yes [place], Modena Nunnery, MD  methocarbamol (ROBAXIN) 500 MG tablet Take 1 tablet (500 mg total) by mouth daily as needed for muscle spasms. Patient taking differently:  Take 500 mg by mouth as needed for muscle spasms.  02/28/19  Yes Ofilia Neas, PA-C  Multiple Vitamins-Minerals (MULTIVITAMINS THER. W/MINERALS) TABS Take 1 tablet by mouth daily.   Yes [provider]  ondansetron (ZOFRAN) 4 MG tablet Take 1 tablet (4 mg total) by mouth every 8 (eight) hours as needed for nausea or vomiting. 01/15/20  Yes [place], Modena Nunnery, MD  pantoprazole (PROTONIX) 40 MG tablet TAKE 1 TABLET BY MOUTH EVERY DAY Patient taking differently: Take 40 mg by mouth daily.  01/15/20  Yes [place], Modena Nunnery, MD  potassium chloride (KLOR-CON) 10 MEQ tablet Take 1 tablet (10 mEq total) by mouth daily. 10/03/19  Yes Deboraha Sprang, MD  vitamin B-12 (CYANOCOBALAMIN) 500 MCG tablet Take 500 mcg by mouth daily.   Yes [provider]    Inpatient Medications: Scheduled Meds: . apixaban  5 mg Oral BID  . atorvastatin  10 mg Oral q1800  . carvedilol  18.75 mg Oral BID WC  . diltiazem  360 mg Oral Daily  . folic acid  1 mg Oral BID  . levothyroxine  50 mcg Oral QAC breakfast  . magnesium oxide  1,200 mg Oral BID  . pantoprazole  40 mg Oral Daily  . vitamin B-12  500 mcg Oral Daily   Continuous Infusions: . azithromycin    . cefTRIAXone (ROCEPHIN)  IV     PRN Meds: acetaminophen, ondansetron **OR** ondansetron (ZOFRAN) IV  Allergies:    Allergies  Allergen Reactions  . Monopril [Fosinopril] Cough  . Magnesium-Containing Compounds Nausea Only and Other (See Comments)    Re: Mag-Ox at higher doses caused a lot of stomach upset (dose had to be decreased)  .  Norvasc [Amlodipine Besylate] Other (See Comments)    10 mg dose caused LE edema- can tolerate 5 mg dose    Social History:   Social History   Socioeconomic History  . Marital status: Divorced    Spouse name: Not on file  . Number of children: Not on file  . Years of education: Not on file  . Highest education level: Not on file  Occupational History  . Not on file  Tobacco Use  . Smoking status: Former Smoker    Packs/day: 0.25    Years: 2.00    Pack years: 0.50    Types: Cigarettes  . Smokeless tobacco: Never Used  Substance and Sexual Activity  . Alcohol use: Yes    Comment: occasional  . Drug use: Never  . Sexual activity: Never  Other Topics Concern  . Not on file  Social History Narrative  . Not on file   Social Determinants of Health   Financial Resource Strain:   . Difficulty of Paying Living Expenses:   Food Insecurity:   . Worried About Charity fundraiser in the Last Year:   . Arboriculturist in the Last Year:   Transportation Needs:   . Film/video editor (Medical):   Marland Kitchen Lack of Transportation (Non-Medical):   Physical Activity:   . Days of Exercise per Week:   . Minutes of Exercise per Session:   Stress:   . Feeling of Stress :   Social Connections:   . Frequency of Communication with Friends and Family:   . Frequency of Social Gatherings with Friends and Family:   . Attends Religious Services:   . Active Member of Clubs or Organizations:   . Attends Archivist Meetings:   Marland Kitchen Marital Status:  Intimate Partner Violence:   . Fear of Current or Ex-Partner:   . Emotionally Abused:   Marland Kitchen Physically Abused:   . Sexually Abused:     Family History:   Family History  Problem Relation Age of Onset  . Cancer Mother        breast  . Heart attack Mother   . Stroke Father   . Diabetes Sister   . Diabetes Daughter    Family Status:  Family Status  Relation Name Status  . Mother  Deceased  . Father  Deceased  . Sister  Deceased  .  Daughter  Alive    ROS:  Please see the history of present illness.  All other ROS reviewed and negative.     Physical Exam/Data:   Vitals:   01/17/20 0300 01/17/20 0351 01/17/20 0351 01/17/20 0727  BP: 100/69  114/88   Pulse: 87  82 (!) 102  Resp: 16  20 20   Temp:   98.3 F (36.8 C) (!) 97.3 F (36.3 C)  TempSrc:    Oral  SpO2: 94%  94%   Weight:  83 kg    Height:  4\' 10"  (1.473 m)      Intake/Output Summary (Last 24 hours) at 01/17/2020 1006 Last data filed at 01/17/2020 0407 Gross per 24 hour  Intake 220 ml  Output 350 ml  Net -130 ml   Filed Weights   01/17/20 0351  Weight: 83 kg   Body mass index is 38.23 kg/m.   General: Well developed, well nourished, NAD Skin: Warm, dry, intact  Head: Normocephalic, atraumatic, sclera non-icteric, no xanthomas, clear, moist mucus membranes. Neck: Negative for carotid bruits. No JVD Lungs:Clear to ausculation bilaterally. No wheezes, rales, or rhonchi. Breathing is unlabored. Cardiovascular: RRR with S1 S2. No murmurs, rubs, gallops, or LV heave appreciated. Abdomen: Soft, non-tender, non-distended with normoactive bowel sounds. No hepatomegaly, No rebound/guarding. No obvious abdominal masses. MSK: Strength and tone appear normal for age. 5/5 in all extremities Extremities: No edema. No clubbing or cyanosis. DP/PT pulses 2+ bilaterally Neuro: Alert and oriented. No focal deficits. No facial asymmetry. MAE spontaneously. Psych: Responds to questions appropriately with normal affect.     EKG:  The EKG was personally reviewed and demonstrates: 01/16/20 AF with HR 105bpm and PVC with no acute changes  Telemetry:  Telemetry was personally reviewed and demonstrates: 01/17/20 AF with rates in the 90's   Relevant CV Studies:  Echocardiogram 03/04/2019:  1. The left ventricle has normal systolic function with an ejection  fraction of 60-65%. The cavity size was normal. There is mildly increased  left ventricular wall thickness.  Left ventricular diastolic Doppler  parameters are consistent with  pseudonormalization.  2. The right ventricle has normal systolic function. The cavity was  normal. There is no increase in right ventricular wall thickness.  3. Left atrial size was mildly dilated.  4. No evidence of mitral valve stenosis.  5. Aortic valve regurgitation is mild by color flow Doppler. No stenosis  of the aortic valve.  6. The interatrial septum was not assessed.    Lexiscan stress test 03/29/2019:   Nuclear stress EF: 64%.  The left ventricular ejection fraction is normal (55-65%).  The study is normal.  This is a low risk study.   Low risk stress nuclear study with normal perfusion and normal left ventricular regional and global systolic function.  Laboratory Data:  Chemistry Recent Labs  Lab 01/14/20 1003 01/16/20 2344 01/17/20 0707  NA 139 136  138  K 4.6 4.2 4.8  CL 97* 100 97*  CO2 27 28 26   GLUCOSE 143* 103* 166*  BUN 22 21 21   CREATININE 1.40* 1.63* 1.62*  CALCIUM 8.1* 6.9* 7.0*  GFRNONAA 37* 31* 31*  GFRAA 42* 35* 36*  ANIONGAP 15 8 15     Total Protein  Date Value Ref Range Status  01/17/2020 5.9 (L) 6.5 - 8.1 g/dL Final   Albumin  Date Value Ref Range Status  01/17/2020 1.6 (L) 3.5 - 5.0 g/dL Final   AST  Date Value Ref Range Status  01/17/2020 29 15 - 41 U/L Final   ALT  Date Value Ref Range Status  01/17/2020 14 0 - 44 U/L Final   Alkaline Phosphatase  Date Value Ref Range Status  01/17/2020 85 38 - 126 U/L Final   Total Bilirubin  Date Value Ref Range Status  01/17/2020 1.3 (H) 0.3 - 1.2 mg/dL Final   Hematology Recent Labs  Lab 01/16/20 2344 01/17/20 0707  WBC 9.3 6.2  RBC 3.51* 3.37*  HGB 10.5* 10.1*  HCT 33.9* 32.2*  MCV 96.6 95.5  MCH 29.9 30.0  MCHC 31.0 31.4  RDW 16.6* 16.6*  PLT 263 265   Cardiac EnzymesNo results for input(s): TROPONINI in the last 168 hours. No results for input(s): TROPIPOC in the last 168 hours.  BNP Recent  Labs  Lab 01/16/20 2344  BNP 526.5*    DDimer No results for input(s): DDIMER in the last 168 hours. TSH:  Lab Results  Component Value Date   TSH 5.118 (H) 01/17/2020   Lipids: Lab Results  Component Value Date   CHOL 189 07/04/2019   HDL 78 07/04/2019   LDLCALC 89 07/04/2019   TRIG 127 07/04/2019   CHOLHDL 2.4 07/04/2019   HgbA1c: Lab Results  Component Value Date   HGBA1C 8.9 (H) 12/25/2019    Radiology/Studies:  DG Chest 2 View  Result Date: 01/16/2020 CLINICAL DATA:  Cough. EXAM: CHEST - 2 VIEW COMPARISON:  Most recent radiograph 2 days ago 01/14/2020, additional priors. FINDINGS: Left-sided pacemaker remains in place. Similar appearance of moderate size left pleural effusion and adjacent basilar opacity. Worsening patchy opacity in the right mid lower lung zone. Suspected small right pleural effusion. Unchanged heart size and mediastinal contours, left heart border partially obscured by adjacent pleural effusion. No pneumothorax. Chronic change of the left shoulder. IMPRESSION: 1. Similar appearance of moderate size left pleural effusion and adjacent basilar opacity over the past 2 days. 2. Worsening patchy opacity in the right mid and lower lung zone, suspicious for pneumonia. Possible small right pleural effusion. Electronically Signed   By: Keith Rake M.D.   On: 01/16/2020 22:43   DG Chest 2 View  Result Date: 01/14/2020 CLINICAL DATA:  74 year old female with history of cough and dyspnea. EXAM: CHEST - 2 VIEW COMPARISON:  Chest x-ray 12/09/2019. FINDINGS: New moderate to large left pleural effusion with opacity at the left base which may reflect atelectasis and/or consolidation. More ill-defined opacities are also noted throughout the right mid to lower lung, concerning for areas of airspace consolidation. No pneumothorax. No evidence of pulmonary edema. Cardiac silhouette is obscured. Upper mediastinal contours are within normal limits. Aortic atherosclerosis.  Left-sided pacemaker device in place with lead tips projecting over the expected location of the right atrium and right ventricle. Surgical clips project over the right upper quadrant of the abdomen, likely from prior cholecystectomy. IMPRESSION: 1. Interval development of moderate to large left pleural effusion with atelectasis  and/or consolidation in the left lung base. 2. Patchy areas of airspace consolidation throughout the right mid to lower lung concerning for multilobar pneumonia. 3. Aortic atherosclerosis. Electronically Signed   By: Vinnie Langton M.D.   On: 01/14/2020 13:33   Assessment and Plan:   1. Persistent atrial fibrillation with RVR: -Recently dx with PAF during a hospitalization for COVID PNA. She was then seen in the OP setting  (12/2019) found to be in AF with RVR with associated BLE>>HF exacerbated by AF therefore her Lasix was increased from 40mg  PO QD to BID with minimal relief. She was referred to the AF clinic and found to have a persistent cough. CXR evidence of moderate to large left pleural effusion with atelectasis and/or consolidation in the left lung base, patchy areas of airspace consolidation throughout the right mid to lower lung concerning for multilobar pneumonia. CXR results forwarded to PCP to determine plan. She was ultimately started in [place]. Despite taking these medications, she has had SOB -CXR on admission showed similar appearance of moderate size left pleural effusion and adjacent basilar opacity over the past 2 days with worsening patchy opacity in the right mid and lower lung zone, suspicious for pneumonia.  -Creatinine found to be elevated above baseline at 1.6>>baseline is 0.9-1.0 -BNP up at 526 on admission  -Given IV Lasix 40mg x1 and started on empiric abx for the treatment of PNA -Continues to have fluid volume overload on exam with abnormal CXR and 3+ BLE -To her knowledge she has not missed any Eliquis doses would continue to hold off on TEE  guided DCCV due to poor respiratory status. She ultimately needs thoracentesis for worsening pleural effusion -Would plan for IV Lasix 20mg  daily to assist in fluid volume management   -Daily BMET  -Once stable from a respiratory standpoint>>will consider TEE/DCCV -Will stop Eliqiuis and start IV Heparin infusion   2. Acute CHF: -CHF symptoms likely in the setting of worsening pleural effusion  -Last echocardiogram with LVEF at 60-65% 02/2019>>>would repeat while hospitalized to assess LV function  -Continues to have significant fluid volume overload on exam with 3+ BLE -Would start IV Lasix 20mg  QD and follow response closely  -Weight, 182lb  -I&O, net negative 16ml  3. Evidence of PNA/worsening plueral effusion: -Recently hospitalized with COVID PNA>>>discharged on supplemental oxygen and returns with progressive SOB and HF symptoms  -Has been seen by several providers in the OP setting for worsening SOB found to have evidence of PNA and worsening pleural effusion on repeat CXR imaging  -Will recommend pulmonary consult for possible thoracentesis then can treat and manage AF  -Currently on abx therapy per IM  -Management per primary team   4. Acute renal insufficiency: -Baseline creatinine appears to be in the 0.9-1.0 range  -Creatinine today is elevated at 1.62>>>likely due to fluid volume overload -Would start IV Lasix given acute CHF in the setting of persistent AF and follow response closely    5. Hypotension: -Low BPs noted on monitor during my exam -SBPs 80's and asymptomatic  -May need to down titrate meds  -On PTA carvedilol, diltiazem\, losartan (held)  6. Hyperlipidemia: -Last LDL, 06/2018 with total cholesterol 189, LDL of 89 HDL 78 -Continue statin therapy    For questions or updates, please contact [place] Please consult [email or web address] for contact info under Cardiology/STEMI.   Lyndel Safe NP-C [facility] Pager: [phone] 01/17/2020 10:06  AM   Patient examined chart reviewed discussed care with NP and patient. Exam with elderly female  No JVP elevation decreased BS mid/base on left no murmur abdomen soft no edema palpable DP/PT Hold DOAC and cover with heparin. Should have US guided left thoracentesis. Continue beta blocker at higher dose 18.75 mg coreg bid and cardizem for rate control Has PPM back up. Echo to reassess EF Cover for pneumonia with rocephin and zithromax   Jenkins Rouge MD [facility]

## 2020-01-17 NOTE — Plan of Care (Signed)

## 2020-01-18 ENCOUNTER — Inpatient Hospital Stay (HOSPITAL_COMMUNITY): Payer: PPO

## 2020-01-18 ENCOUNTER — Ambulatory Visit: Payer: PPO | Admitting: Family Medicine

## 2020-01-18 ENCOUNTER — Other Ambulatory Visit (HOSPITAL_COMMUNITY): Payer: PPO

## 2020-01-18 DIAGNOSIS — I361 Nonrheumatic tricuspid (valve) insufficiency: Secondary | ICD-10-CM

## 2020-01-18 DIAGNOSIS — I351 Nonrheumatic aortic (valve) insufficiency: Secondary | ICD-10-CM

## 2020-01-18 DIAGNOSIS — J90 Pleural effusion, not elsewhere classified: Secondary | ICD-10-CM

## 2020-01-18 DIAGNOSIS — N179 Acute kidney failure, unspecified: Secondary | ICD-10-CM

## 2020-01-18 DIAGNOSIS — E1165 Type 2 diabetes mellitus with hyperglycemia: Secondary | ICD-10-CM

## 2020-01-18 DIAGNOSIS — N189 Chronic kidney disease, unspecified: Secondary | ICD-10-CM

## 2020-01-18 DIAGNOSIS — D631 Anemia in chronic kidney disease: Secondary | ICD-10-CM

## 2020-01-18 HISTORY — PX: IR THORACENTESIS ASP PLEURAL SPACE W/IMG GUIDE: IMG5380

## 2020-01-18 LAB — URINALYSIS, ROUTINE W REFLEX MICROSCOPIC
Bilirubin Urine: NEGATIVE
Glucose, UA: NEGATIVE mg/dL
Hgb urine dipstick: NEGATIVE
Ketones, ur: NEGATIVE mg/dL
Nitrite: NEGATIVE
Protein, ur: NEGATIVE mg/dL
Specific Gravity, Urine: 1.013 (ref 1.005–1.030)
pH: 5 (ref 5.0–8.0)

## 2020-01-18 LAB — RENAL FUNCTION PANEL
Albumin: 1.6 g/dL — ABNORMAL LOW (ref 3.5–5.0)
Anion gap: 11 (ref 5–15)
BUN: 35 mg/dL — ABNORMAL HIGH (ref 8–23)
CO2: 27 mmol/L (ref 22–32)
Calcium: 6.4 mg/dL — CL (ref 8.9–10.3)
Chloride: 99 mmol/L (ref 98–111)
Creatinine, Ser: 2.08 mg/dL — ABNORMAL HIGH (ref 0.44–1.00)
GFR calc Af Amer: 26 mL/min — ABNORMAL LOW (ref >60)
GFR calc non Af Amer: 23 mL/min — ABNORMAL LOW (ref >60)
Glucose, Bld: 265 mg/dL — ABNORMAL HIGH (ref 70–99)
Phosphorus: 3.5 mg/dL (ref 2.5–4.6)
Potassium: 3.5 mmol/L (ref 3.5–5.1)
Sodium: 137 mmol/L (ref 135–145)

## 2020-01-18 LAB — MAGNESIUM
Magnesium: 0.6 mg/dL — CL (ref 1.7–2.4)
Magnesium: 1.4 mg/dL — ABNORMAL LOW (ref 1.7–2.4)

## 2020-01-18 LAB — GLUCOSE, CAPILLARY
Glucose-Capillary: 239 mg/dL — ABNORMAL HIGH (ref 70–99)
Glucose-Capillary: 157 mg/dL — ABNORMAL HIGH (ref 70–99)

## 2020-01-18 LAB — BASIC METABOLIC PANEL WITH GFR
Anion gap: 11 (ref 5–15)
BUN: 34 mg/dL — ABNORMAL HIGH (ref 8–23)
CO2: 25 mmol/L (ref 22–32)
Calcium: 6.3 mg/dL — CL (ref 8.9–10.3)
Chloride: 99 mmol/L (ref 98–111)
Creatinine, Ser: 2.19 mg/dL — ABNORMAL HIGH (ref 0.44–1.00)
GFR calc Af Amer: 25 mL/min — ABNORMAL LOW (ref >60)
GFR calc non Af Amer: 21 mL/min — ABNORMAL LOW (ref >60)
Glucose, Bld: 313 mg/dL — ABNORMAL HIGH (ref 70–99)
Potassium: 4.2 mmol/L (ref 3.5–5.1)
Sodium: 135 mmol/L (ref 135–145)

## 2020-01-18 LAB — T4, FREE: Free T4: 1.14 ng/dL — ABNORMAL HIGH (ref 0.61–1.12)

## 2020-01-18 LAB — PROCALCITONIN: Procalcitonin: <0.1 ng/mL

## 2020-01-18 LAB — PROTEIN, PLEURAL OR PERITONEAL FLUID: Total protein, fluid: 3.3 g/dL

## 2020-01-18 LAB — STREP PNEUMONIAE URINARY ANTIGEN: Strep Pneumo Urinary Antigen: NEGATIVE

## 2020-01-18 LAB — APTT
aPTT: 103 s — ABNORMAL HIGH (ref 24–36)
aPTT: 104 seconds — ABNORMAL HIGH (ref 24–36)

## 2020-01-18 LAB — ECHOCARDIOGRAM COMPLETE
Height: 58 in
Weight: 2950.4 [oz_av]

## 2020-01-18 LAB — SODIUM, URINE, RANDOM: Sodium, Ur: 12 mmol/L

## 2020-01-18 LAB — HEPARIN LEVEL (UNFRACTIONATED): Heparin Unfractionated: >2.2 IU/mL — ABNORMAL HIGH (ref 0.30–0.70)

## 2020-01-18 LAB — PROTEIN / CREATININE RATIO, URINE
Creatinine, Urine: 98.2 mg/dL
Protein Creatinine Ratio: 0.09 mg/mg{creat} (ref 0.00–0.15)
Total Protein, Urine: 9 mg/dL

## 2020-01-18 LAB — LACTATE DEHYDROGENASE, PLEURAL OR PERITONEAL FLUID: LD, Fluid: 140 U/L — ABNORMAL HIGH (ref 3–23)

## 2020-01-18 MED ORDER — LIDOCAINE HCL 1 % IJ SOLN
INTRAMUSCULAR | Status: AC
  Filled 2020-01-18: qty 20

## 2020-01-18 MED ORDER — ALBUMIN HUMAN 25 % IV SOLN
25.0000 g | Freq: Two times a day (BID) | INTRAVENOUS | Status: DC
  Administered 2020-01-18 – 2020-01-20 (×5): 25 g via INTRAVENOUS
  Filled 2020-01-18 (×5): qty 100

## 2020-01-18 MED ORDER — CALCIUM CARBONATE-VITAMIN D 500-200 MG-UNIT PO TABS
1.0000 | ORAL_TABLET | Freq: Three times a day (TID) | ORAL | Status: DC
  Administered 2020-01-18 – 2020-01-26 (×25): 1 via ORAL
  Filled 2020-01-18 (×25): qty 1

## 2020-01-18 MED ORDER — INSULIN ASPART 100 UNIT/ML ~~LOC~~ SOLN
0.0000 [IU] | Freq: Three times a day (TID) | SUBCUTANEOUS | Status: DC
  Administered 2020-01-18: 5 [IU] via SUBCUTANEOUS
  Administered 2020-01-19: 3 [IU] via SUBCUTANEOUS
  Administered 2020-01-19: 2 [IU] via SUBCUTANEOUS
  Administered 2020-01-19: 3 [IU] via SUBCUTANEOUS
  Administered 2020-01-20: 2 [IU] via SUBCUTANEOUS
  Administered 2020-01-20 – 2020-01-21 (×4): 3 [IU] via SUBCUTANEOUS
  Administered 2020-01-21: 5 [IU] via SUBCUTANEOUS
  Administered 2020-01-22: 8 [IU] via SUBCUTANEOUS
  Administered 2020-01-22: 3 [IU] via SUBCUTANEOUS
  Administered 2020-01-22: 2 [IU] via SUBCUTANEOUS
  Administered 2020-01-23: 5 [IU] via SUBCUTANEOUS
  Administered 2020-01-23: 3 [IU] via SUBCUTANEOUS
  Administered 2020-01-23: 5 [IU] via SUBCUTANEOUS
  Administered 2020-01-24: 3 [IU] via SUBCUTANEOUS
  Administered 2020-01-24 (×2): 5 [IU] via SUBCUTANEOUS
  Administered 2020-01-25 (×2): 3 [IU] via SUBCUTANEOUS
  Administered 2020-01-25 – 2020-01-26 (×2): 2 [IU] via SUBCUTANEOUS

## 2020-01-18 MED ORDER — LIDOCAINE HCL (PF) 1 % IJ SOLN
INTRAMUSCULAR | Status: AC | PRN
  Administered 2020-01-18: 10 mL

## 2020-01-18 MED ORDER — MAGNESIUM SULFATE 50 % IJ SOLN
3.0000 g | Freq: Once | INTRAVENOUS | Status: AC
  Administered 2020-01-18: 3 g via INTRAVENOUS
  Filled 2020-01-18: qty 6

## 2020-01-18 MED ORDER — INSULIN ASPART 100 UNIT/ML ~~LOC~~ SOLN
0.0000 [IU] | Freq: Every day | SUBCUTANEOUS | Status: DC
  Administered 2020-01-21: 3 [IU] via SUBCUTANEOUS
  Administered 2020-01-22: 2 [IU] via SUBCUTANEOUS

## 2020-01-18 NOTE — Progress Notes (Signed)
[place] for heparin  Indication: atrial fibrillation  Allergies  Allergen Reactions  . Monopril [Fosinopril] Cough  . Magnesium-Containing Compounds Nausea Only and Other (See Comments)    Re: Mag-Ox at higher doses caused a lot of stomach upset (dose had to be decreased)  . Norvasc [Amlodipine Besylate] Other (See Comments)    10 mg dose caused LE edema- can tolerate 5 mg dose    Patient Measurements: Height: 4\' 10"  (147.3 cm) Weight: 83 kg (182 lb 14.4 oz) IBW/kg (Calculated) : 40.9   Vital Signs: Temp: 98.1 F (36.7 C) (05/28 0030) Temp Source: Oral (05/28 0030) BP: 96/46 (05/28 0030) Pulse Rate: 86 (05/28 0030)  Labs: Recent Labs    01/16/20 2344 01/17/20 0356 01/17/20 0707 01/18/20 0018  HGB 10.5*  --  10.1*  --   HCT 33.9*  --  32.2*  --   PLT 263  --  265  --   APTT  --   --   --  103*  CREATININE 1.63*  --  1.62*  --   TROPONINIHS  --  7 6  --     Estimated Creatinine Clearance: 27.3 mL/min (A) (by C-G formula based on SCr of 1.62 mg/dL (H)).   Assessment: 74 yo female with afib on apixaban (last dose was 5/27 at ~ 4am). Pharmacy consulted to dose heparin. Plans noted for possible TEE/DCCV.  PTT 103 sec (just slightly supratherapeutic) on gtt at 1150 units/hr. Heparin level still pending (likely elevated due to apixaban). No bleeding noted.  Goal of Therapy:  Heparin level 0.3-0.7 units/ml aPTT 66-102 seconds Monitor platelets by anticoagulation protocol: Yes   Plan:  -Decrease heparin to 1100 units/hr  -Will f/u 8 hr PTT  Sherlon Handing, PharmD, BCPS Please see amion for complete clinical pharmacist phone list 01/18/2020 1:17 AM

## 2020-01-18 NOTE — Consult Note (Addendum)
[name] Admit Date: 01/16/2020 01/18/2020 [name] Requesting Physician:  [name]  Reason for Consult:  AKI HPI:  74 year old female with tachybradycardia syndrome status post pacemaker, atrial fibrillation, diabetes type 2, recent Covid pneumonia who presented from her cardiology office with worsening shortness of breath.  Patient states that she is feeling a lot better today and that her shortness of breath is significantly improved.  She also noted worsening edema in all 4 of her extremities over the past few weeks.  She has intermittently had some mild lower extremity edema that improves at night when she goes to bed but never edema like this.  She is also not having such a hard time with shortness of breath.  She has had longstanding diabetes but it has been fairly well controlled over time on oral medications and she is not on insulin.  She denies any NSAID use.  She has never had any hematuria.  She has not noted frothy urine.  She has had a poor appetite at home due to nausea.  She was admitted due to concern for combination of heart failure exacerbation and pneumonia.  She has been undergoing IV diuresis with a rising creatinine and some improvement in her pulmonary status.  She has been getting antibiotics for possible pneumonia.  Her ARB was held on admission due to concern for AKI.  PMH Incudes: Past Medical History:  Diagnosis Date  . Abrasion of skin with infection 05/09/2013  . Acute blood loss anemia 08/05/2012  . Anemia 11/21/2013  . Arthritis    osteoarthritis  . Atrial fibrillation (HCC)    Paroxysmal atrial fibrillation  . Bursitis of right shoulder   . Chronic cholecystitis with calculus 05/04/2013  . 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 Diabetes type 2, controlled ([place]) 05/04/2013  . Essential hypertension, benign 10/14/2012   Essential hypertension  . Fatty liver disease, nonalcoholic [id number]   hospitalized, MRCP dx  fatty liver  . Gastroenteritis 10/16/2013  . GERD (gastroesophageal reflux disease)   . History of hypertension 08/20/2016  . History of total bilateral knee replacement 06/22/2017   Right= 2013. Left=2003  . Hormone replacement therapy (postmenopausal)   . 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 Hyperlipidemia   . Hypertension   . Hypertensive urgency 07/11/2018  . Hypokalemia 07/11/2018  . Hypomagnesemia 07/21/2016  . Hypothyroidism 03/28/2019  . Malnutrition of moderate degree ([place]) 03/18/2014  . Morbid obesity due to excess calories ([place]) 12/27/2012   Body mass index is 37.16 kg/m.  -  trending up  Lab Results Component Value Date  TSH 1.520 10/15/2013    Contributing to gerd risk/ doe/reviewed the need and the process to achieve and maintain neg calorie balance > defer f/u primary care including intermittently monitoring thyroid status    . Multiple pulmonary nodules 04/04/2014   Followed in Pulmonary clinic/ [facility]/ [facility] - See CT chest 03/17/14 > not viz on abd CT from 10/16/13 as did not go high enough on lung slices >>>in tickle file for recall 06/17/14  - Spirometry 04/04/14 wnl sp smoking cessation 02/2014 - 06/26/2014 Right middle lobe 1.0 cm pulmonary nodule is similar over the past 3 months. This remains indeterminate.   - CT 12/25/14  Pulmonary nodules are st  . Nausea & vomiting 07/11/2018  . Normal cardiac stress test    pt can't remember when or where  . Obesity, unspecified 12/27/2012  . Osteoarthritis of right knee 08/05/2012  .  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 without organ or systems involvement ([place])   . Rheumatoid arthritis(714.0) 09/28/2012  . Sepsis ([place]) 03/16/2014  . Sinus pause 05/17/2019   Sinus pauses  . SIRS (systemic inflammatory response syndrome) ([place]) 07/11/2018  . Syncope and collapse 03/03/2019  . 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 Thyroid nodule 03/05/2016   See CT chest 02/25/16 - u/s thyroid 03/04/16 > Pos L nodules > both  Bx 123XX123 > BENIGN FOLLICULAR NODULE  . Unspecified essential hypertension 05/04/2013       Creat (mg/dL)  Date Value  01/02/2020 1.39 (H)  12/25/2019 0.91  11/21/2019 1.08 (H)  10/31/2019 1.04 (H)  07/04/2019 0.87  03/28/2019 0.90  01/30/2019 0.98 (H)  11/27/2018 0.95 (H)  07/27/2018 0.79  04/21/2018 1.02 (H)   Creatinine, Ser (mg/dL)  Date Value  01/18/2020 2.08 (H)  01/18/2020 2.19 (H)  01/17/2020 1.62 (H)  01/16/2020 1.63 (H)  01/14/2020 1.40 (H)  12/09/2019 0.99  12/08/2019 0.90  12/07/2019 0.92  12/06/2019 1.07 (H)  12/05/2019 0.88  ] I/Os:  ROS  Balance of 12 systems is negative w/ exceptions as above  PMH  Past Medical History:  Diagnosis Date  . Abrasion of skin with infection 05/09/2013  . Acute blood loss anemia 08/05/2012  . Anemia 11/21/2013  . Arthritis    osteoarthritis  . Atrial fibrillation (HCC)    Paroxysmal atrial fibrillation  . Bursitis of right shoulder   . Chronic cholecystitis with calculus 05/04/2013  . 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 Diabetes type 2, controlled ([place]) 05/04/2013  . Essential hypertension, benign 10/14/2012   Essential hypertension  . Fatty liver disease, nonalcoholic [id number]   hospitalized, MRCP dx fatty liver  . Gastroenteritis 10/16/2013  . GERD (gastroesophageal reflux disease)   . History of hypertension 08/20/2016  . History of total bilateral knee replacement 06/22/2017   Right= 2013. Left=2003  . Hormone replacement therapy (postmenopausal)   . 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 Hyperlipidemia   . Hypertension   . Hypertensive urgency 07/11/2018  . Hypokalemia 07/11/2018  . Hypomagnesemia 07/21/2016  . Hypothyroidism 03/28/2019  . Malnutrition of moderate degree ([place]) 03/18/2014  . Morbid obesity due to excess calories ([place]) 12/27/2012   Body mass index is 37.16 kg/m.  -  trending up  Lab Results Component Value Date  TSH 1.520 10/15/2013    Contributing to gerd risk/  doe/reviewed the need and the process to achieve and maintain neg calorie balance > defer f/u primary care including intermittently monitoring thyroid status    . Multiple pulmonary nodules 04/04/2014   Followed in Pulmonary clinic/ [facility]/ [facility] - See CT chest 03/17/14 > not viz on abd CT from 10/16/13 as did not go high enough on lung slices >>>in tickle file for recall 06/17/14  - Spirometry 04/04/14 wnl sp smoking cessation 02/2014 - 06/26/2014 Right middle lobe 1.0 cm pulmonary nodule is similar over the past 3 months. This remains indeterminate.   - CT 12/25/14  Pulmonary nodules are st  . Nausea & vomiting 07/11/2018  . Normal cardiac stress test    pt can't remember when or where  . Obesity, unspecified 12/27/2012  . Osteoarthritis of right knee 08/05/2012  . 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 without organ or systems involvement ([place])   . Rheumatoid arthritis(714.0) 09/28/2012  . Sepsis ([place]) 03/16/2014  . Sinus pause 05/17/2019   Sinus pauses  . SIRS (  systemic inflammatory response syndrome) ([place]) 07/11/2018  . Syncope and collapse 03/03/2019  . 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 Thyroid nodule 03/05/2016   See CT chest 02/25/16 - u/s thyroid 03/04/16 > Pos L nodules > both Bx 123XX123 > BENIGN FOLLICULAR NODULE  . Unspecified essential hypertension 05/04/2013   PSH  Past Surgical History:  Procedure Laterality Date  . ABDOMINAL HYSTERECTOMY    . CHOLECYSTECTOMY N/A 05/04/2013   Procedure: LAPAROSCOPIC CHOLECYSTECTOMY WITH INTRAOPERATIVE CHOLANGIOGRAM;  Surgeon: Haywood Lasso, MD;  Location: [facility];  Service: General;  Laterality: N/A;  . ERCP N/A 05/02/2013   Procedure: ENDOSCOPIC RETROGRADE CHOLANGIOPANCREATOGRAPHY (ERCP);  Surgeon: Beryle Beams, MD;  Location: [facility];  Service: Endoscopy;  Laterality: N/A;  . JOINT REPLACEMENT     left knee  . NASAL SINUS SURGERY    . PACEMAKER IMPLANT N/A 06/11/2019   Procedure: PACEMAKER  IMPLANT;  Surgeon: Deboraha Sprang, MD;  Location: [facility];  Service: Cardiovascular;  Laterality: N/A;  . SPHINCTEROTOMY  05/02/2013   Procedure: SPHINCTEROTOMY;  Surgeon: Beryle Beams, MD;  Location: [facility];  Service: Endoscopy;;  . TONSILLECTOMY    . TOTAL KNEE ARTHROPLASTY  08/01/2012   Procedure: TOTAL KNEE ARTHROPLASTY;  Surgeon: Meredith Pel, MD;  Location: [facility];  Service: Orthopedics;  Laterality: Right;  Right total knee arthroplasty  . TOTAL KNEE ARTHROPLASTY  08/01/2012   RIGHT  KNEE   FH  Family History  Problem Relation Age of Onset  . Cancer Mother        breast  . Heart attack Mother   . Stroke Father   . Diabetes Sister   . Diabetes Daughter    SH  reports that she has quit smoking. Her smoking use included cigarettes. She has a 0.50 pack-year smoking history. She has never used smokeless tobacco. She reports current alcohol use. She reports that she does not use drugs. Allergies  Allergies  Allergen Reactions  . Monopril [Fosinopril] Cough  . Magnesium-Containing Compounds Nausea Only and Other (See Comments)    Re: Mag-Ox at higher doses caused a lot of stomach upset (dose had to be decreased)  . Norvasc [Amlodipine Besylate] Other (See Comments)    10 mg dose caused LE edema- can tolerate 5 mg dose   Home medications Prior to Admission medications   Medication Sig Start Date End Date Taking? Authorizing Provider  albuterol (VENTOLIN HFA) 108 (90 Base) MCG/ACT inhaler Inhale 2 puffs into the lungs every 4 (four) hours as needed for wheezing or shortness of breath. 12/10/19  Yes Ghimire, Henreitta Leber, MD  atorvastatin (LIPITOR) 10 MG tablet TAKE 1 TABLET BY MOUTH EVERY DAY Patient taking differently: Take 10 mg by mouth daily.  02/06/19  Yes Delsa Grana, PA-C  benzonatate (TESSALON) 200 MG capsule Take 1 capsule (200 mg total) by mouth 3 (three) times daily. 01/15/20  Yes [place], Modena Nunnery, MD  Calcium Carbonate-Vitamin D (CALCIUM 600 + D PO)  Take 1 tablet by mouth 2 (two) times daily.   Yes [provider]  carvedilol (COREG) 12.5 MG tablet Take 1.5 tablets (18.75 mg total) by mouth 2 (two) times daily with a meal. 01/09/20  Yes Lorretta Harp, MD  chlorpheniramine-HYDROcodone (TUSSIONEX PENNKINETIC ER) 10-8 MG/5ML SUER Take 5 mLs by mouth every 12 (twelve) hours as needed. Patient taking differently: Take 5 mLs by mouth every 12 (twelve) hours as needed for cough.  01/15/20  Yes [place], Modena Nunnery, MD  diclofenac sodium (  VOLTAREN) 1 % GEL Apply 3 g topically daily as needed (to painful sites (in 3 large joints)).    Yes [provider]  diltiazem (CARDIZEM CD) 360 MG 24 hr capsule TAKE 1 CAPSULE BY MOUTH EVERY DAY Patient taking differently: Take 360 mg by mouth daily.  08/27/19  Yes [place], Modena Nunnery, MD  ELIQUIS 5 MG TABS tablet TAKE 1 TABLET BY MOUTH TWICE A DAY Patient taking differently: Take 5 mg by mouth 2 (two) times daily.  11/23/19  Yes Lorretta Harp, MD  estradiol (ESTRACE) 0.5 MG tablet Take 0.5 mg by mouth 2 (two) times a day.   Yes [provider]  etanercept (ENBREL SURECLICK) 50 MG/ML injection INJECT 50 MG (0.98 ML) UNDER THE SKIN ONCE A WEEK Patient taking differently: Inject 50 mg into the skin once a week. Thursdays (0.98 ML) 11/28/19  Yes Deveshwar, Abel Presto, MD  fish oil-omega-3 fatty acids 1000 MG capsule Take 1 g by mouth at bedtime.    Yes [provider]  folic acid (FOLVITE) 1 MG tablet TAKE 1 TABLET BY MOUTH TWICE A DAY Patient taking differently: Take 1 mg by mouth 2 (two) times daily.  09/21/19  Yes [place], Modena Nunnery, MD  furosemide (LASIX) 40 MG tablet Take 1 tablet (40 mg total) by mouth 2 (two) times daily. 01/09/20  Yes Lorretta Harp, MD  leflunomide (ARAVA) 10 MG tablet TAKE 1 TABLET BY MOUTH EVERY DAY Patient taking differently: Take 10 mg by mouth daily.  09/20/19  Yes Deveshwar, Abel Presto, MD  levofloxacin (LEVAQUIN) 500 MG tablet Take 1 tablet (500 mg total) by mouth  daily. 01/14/20  Yes [place], Modena Nunnery, MD  levothyroxine (SYNTHROID) 50 MCG tablet TAKE 1 TABLET BY MOUTH DAILY BEFORE BREAKFAST Patient taking differently: Take 50 mcg by mouth daily before breakfast.  01/14/20  Yes [place], Modena Nunnery, MD  losartan (COZAAR) 50 MG tablet Take 1 tablet (50 mg total) by mouth daily. 09/11/19  Yes Deboraha Sprang, MD  magnesium oxide (MAG-OX) 400 MG tablet Take 1,200 mg by mouth 2 (two) times daily.  11/22/19  Yes [provider]  metFORMIN (GLUCOPHAGE) 500 MG tablet Take 2 tablets (1,000 mg total) by mouth 2 (two) times daily with a meal. 12/27/19  Yes [place], Modena Nunnery, MD  methocarbamol (ROBAXIN) 500 MG tablet Take 1 tablet (500 mg total) by mouth daily as needed for muscle spasms. Patient taking differently: Take 500 mg by mouth as needed for muscle spasms.  02/28/19  Yes Ofilia Neas, PA-C  Multiple Vitamins-Minerals (MULTIVITAMINS THER. W/MINERALS) TABS Take 1 tablet by mouth daily.   Yes [provider]  ondansetron (ZOFRAN) 4 MG tablet Take 1 tablet (4 mg total) by mouth every 8 (eight) hours as needed for nausea or vomiting. 01/15/20  Yes [place], Modena Nunnery, MD  pantoprazole (PROTONIX) 40 MG tablet TAKE 1 TABLET BY MOUTH EVERY DAY Patient taking differently: Take 40 mg by mouth daily.  01/15/20  Yes [place], Modena Nunnery, MD  potassium chloride (KLOR-CON) 10 MEQ tablet Take 1 tablet (10 mEq total) by mouth daily. 10/03/19  Yes Deboraha Sprang, MD  vitamin B-12 (CYANOCOBALAMIN) 500 MCG tablet Take 500 mcg by mouth daily.   Yes [provider]    Current Medications Scheduled Meds: . atorvastatin  10 mg Oral q1800  . calcium-vitamin D  1 tablet Oral TID  . carvedilol  18.75 mg Oral BID WC  . diltiazem  360 mg Oral Daily  . folic acid  1 mg Oral BID  . insulin aspart  0-15 Units Subcutaneous TID WC  . insulin aspart  0-5 Units Subcutaneous QHS  . levothyroxine  50 mcg Oral QAC breakfast  . lidocaine      . pantoprazole  40 mg Oral Daily  .  vitamin B-12  500 mcg Oral Daily   Continuous Infusions: . azithromycin 500 mg (01/17/20 2234)  . cefTRIAXone (ROCEPHIN)  IV 2 g (01/17/20 2147)  . heparin 1,100 Units/hr (01/18/20 0123)   PRN Meds:.acetaminophen, ondansetron **OR** ondansetron (ZOFRAN) IV  CBC Recent Labs  Lab 01/16/20 2344 01/17/20 0707  WBC 9.3 6.2  NEUTROABS 6.3 5.3  HGB 10.5* 10.1*  HCT 33.9* 32.2*  MCV 96.6 95.5  PLT 263 [id number]   Basic Metabolic Panel Recent Labs  Lab 01/14/20 1003 01/16/20 2344 01/17/20 0707 01/18/20 0342 01/18/20 1244  NA 139 136 138 135 137  K 4.6 4.2 4.8 4.2 3.5  CL 97* 100 97* 99 99  CO2 27 28 26 25 27   GLUCOSE 143* 103* 166* 313* 265*  BUN 22 21 21  34* 35*  CREATININE 1.40* 1.63* 1.62* 2.19* 2.08*  CALCIUM 8.1* 6.9* 7.0* 6.3* 6.4*  PHOS  --   --   --   --  3.5    Physical Exam  Blood pressure 120/73, pulse (!) 110, temperature 98.2 F (36.8 C), resp. rate 18, height 4\' 10"  (1.473 m), weight 83.6 kg, SpO2 (!) 88 %. GEN: Chronically ill-appearing, lying in bed, no distress ENT: no nasal discharge, mmm EYES: no scleral icterus, eomi CV: normal rate, no audible PULM: no iwob, crackles in the bilateral bases ABD: NABS, non-distended SKIN: no rashes or jaundice EXT: 2+ pitting edema in all 4 extremities, warm and well perfused   Assessment 74 year old female with tachybradycardia syndrome status post pacemaker, atrial fibrillation, diabetes type 2, recent Covid pneumonia who presents with extensive volume overload, hypoalbuminemia, and AKI  Plan 1. AKI on CKD: Mild elevation in creatinine for several years but baseline likely around 0.9-1.  Minimal CKD at most.  She did have a creatinine rise from 1.4 on 5/12 and was 1.4 on admission but has risen since admission with diuresis now in the 2s.  Primary team now holding diuretics which is reasonable.  Possible ATN when she had Covid.  Having a hard time with diuresis because she has such extensive hypoalbuminemia with an  albumin of 1.6.  Her volume overloaded could be related to low oncotic pressure as well as heart failure.  I think we should give her albumin at this time and monitor her respiratory status closely.  We will target an albumin greater than 2 and ideally better. 1. Start IV albumin 25 g twice daily until serum albumin is greater than 2 2. Agree with holding diuretics but ultimately will need to be restarted to help with volume overload; maybe once her albumin has been normalized 3. Agree with holding ARB 4. Continue to monitor intake and output as well as daily weights 5. Encourage p.o. intake 6. Obtain urinalysis, UPC, urine sodium, urine urea 2. Hypoalbuminemia: Extensively low albumin of 1.6.  Previously low during Covid hospitalization in the twos but now much worse.  Likely negative acute phase reactant with poor p.o. intake possibly contributing.  The other consideration would be a new florid nephrotic syndrome which can sometimes be seen with Covid disease.  We will send UPC and consider this further if it is markedly elevated.  Supplementing albumin as above 3. Possible pneumonia: Infiltrate  on x-ray but was there previously.  Treating with CAP coverage empirically 4. Bilateral pleural effusions: Status post thoracentesis today.  Primary team to follow-up fluid studies but most likely hemodynamic in nature related to low oncotic pressure and volume overload 5. Atrial fibrillation: With some RVR.  On rate controlling medications and IV heparin.  Cardiology following 6. Hypoxic respiratory failure: Mostly chronic in nature mainly associated with Covid.  Weaning oxygen as able and diuresing as permitted by creatinine 7. Type 2 diabetes uncontrolled with hyperglycemia: Recent A1c around 9%.  Only on oral medications.  Needs to optimize control as able 8. Rheumatoid arthritis: On home medication with no signs of flare 9. Anemia of chronic disease: Minimal contribution from kidney disease.  No  indication for ESA at this time. 10. Hypomagnesemia: Magnesium is low 0.6.  3 g of IV magnesium given.  We will continue to monitor 11. Hypothyroidism: On home Synthroid  Plans communicated to the primary team   [name]  [id number] pgr 01/18/2020, 2:53 PM

## 2020-01-18 NOTE — Progress Notes (Signed)
CRITICAL VALUE ALERT  Critical Value:  Magnesium 0.6, Calcium 6.3  Date & Time Notied:01/18/20 @  0600  Provider Notified:Dr Mansy  Orders Received/Actions taken: Mag 3 g x 1,  OSCAL with D 500-200mg  3 x daily

## 2020-01-18 NOTE — Progress Notes (Signed)
Inpatient Diabetes Program Recommendations  AACE/ADA: New Consensus Statement on Inpatient Glycemic Control (2015)  Target Ranges:  Prepandial:   less than 140 mg/dL      Peak postprandial:   less than 180 mg/dL (1-2 hours)      Critically ill patients:  140 - 180 mg/dL   Lab Results  Component Value Date   GLUCAP 295 (H) 12/10/2019   HGBA1C 8.9 (H) 12/25/2019    Review of Glycemic Control  Diabetes history: type 2 Outpatient Diabetes medications: Metformin 1000 mg BID Current orders for Inpatient glycemic control: none  Inpatient Diabetes Program Recommendations:     Noted that patient was admitted to hospital in April with COVID and was on Novolog correction scale 0-15 units TID & HS scale.   Lab glucose was 313 mg/dl at 0342.  Recommend checking CBGs TID & HS and covering with insulin correction scale.   Harvel Ricks RN BSN CDE Diabetes Coordinator Pager: [phone]  8am-5pm

## 2020-01-18 NOTE — Progress Notes (Signed)
PROGRESS NOTE  [name] [id number] DOB: April 12, 1946   PCP: Katherine Rossetti, MD  Patient is from: Home. Lives with daughter and grandchildren. Uses walker since COVID-19 infection.  DOA: 01/16/2020 LOS: 1  Brief Narrative / Interim history: 74 year old female with history of SSS/PPM, A. fib, DM-2, respiratory failure on 2 L after COVID-19 infection about a month ago. She presented with progressive shortness of breath for 1 week despite increased dose of Lasix by her cardiologist and Levaquin by PCP. He also had associated chest pressure.  In ED, HR in 120s. 95% on 3 L. Signs of fluid overload with JVD and BLE edema. CXR consistent with CHF, moderate left pleural effusion and possible right lung infiltrate. BNP elevated to 526. Cr 1.6. EKG A. fib with mild RVR. Received IV Lasix, empiric antibiotics for pneumonia and admitted for acute on chronic CHF, possible pneumonia and A. fib. Cardiology consulted.  Started on low-dose IV Lasix.  Renal function worse.  5/28-nephrology consulted  Subjective: Seen and examined earlier this morning.  No major events overnight of this morning.  She reports orthopnea.  Still with significant swelling in her legs.  Denies chest pain, GI or UTI symptoms.  Minimal urine output over the last 24 hours.  Renal function worse.  Soft blood pressures.  Objective: Vitals:   01/18/20 0030 01/18/20 0430 01/18/20 0500 01/18/20 0826  BP: (!) 96/46 (!) 96/58  120/73  Pulse: 86 80  (!) 110  Resp: 18     Temp: 98.1 F (36.7 C) 98.2 F (36.8 C)    TempSrc: Oral     SpO2: 91% (!) 88%    Weight:   83.6 kg   Height:        Intake/Output Summary (Last 24 hours) at 01/18/2020 1257 Last data filed at 01/18/2020 0708 Gross per 24 hour  Intake 245.15 ml  Output 400 ml  Net -154.85 ml   Filed Weights   01/17/20 0351 01/18/20 0500  Weight: 83 kg 83.6 kg    Examination:  GENERAL: No apparent distress.  Nontoxic. HEENT: MMM.  Vision and hearing grossly  intact.  NECK: Supple.  Notable JVD. RESP: On 2 L by [place].  No IWOB.  Fair aeration bilaterally. CVS:  RRR. Heart sounds normal.  ABD/GI/GU: BS+. Abd soft, NTND.  MSK/EXT:  Moves extremities. No apparent deformity.  2+ pitting BLE edema. SKIN: no apparent skin lesion or wound NEURO: Awake, alert and oriented appropriately.  No apparent focal neuro deficit. PSYCH: Calm. Normal affect.   Procedures:  -5/28-IR thoracocentesis.  Microbiology summarized: None  Assessment & Plan: Acute on chronic diastolic CHF: Echo in 0000000 with EF of 60 to 65%. 2+ BLE edema with some JVD. Failed outpatient treatment.  Minimal urine output with IV Lasix.  Soft blood pressures.  Renal function worse.  -IV Lasix on hold per cardiology.  She may respond better to higher dose of IV Lasix with midodrine or inotrope -Monitor fluid status, renal function and electrolytes -Sodium and fluid restriction -She is anxious about TED hose due to arthritis. Recommended leg elevation. -Follow echocardiogram.  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 BUN 34. Seems she had ATN when she had a COVID-19 last month. Not on nephrotoxic meds other than Lasix at home.  Suspect some cardiorenal syndrome this time. -Nephrology, Dr. Joylene Grapes consulted -IV Lasix discontinued by cardiology -Recheck renal function in the afternoon.  Possible pneumonia: Clinically low suspicion but some radiologic evidence  of right lung infiltrate.  This was also present on his CXR on 5/18 and 5/24.  Pro-Cal negative but might not be reliable while on Arava.  She has no fever or leukocytosis.  CXR finding could be sequela of her recent COVID-19 infection. -Continue ceftriaxone and azithromycin empirically -Repeat chest x-ray in 3 to 4 weeks to ensure resolution  Bilateral pleural effusion, moderate on the left and small on the right-likely due to CHF -IR thoracocentesis on 5/28 -Follow fluid  status.  Persistent A. fib with RVR: Still with mild RVR. -Continue Cardizem, Coreg and IV heparin per cardiology.  Chronic respiratory failure due to COVID-19 infection-was discharged on 2 L last month.  Repeat CXR with persistent bibasilar opacities. -Wean oxygen as able -Incentive spirometry  Uncontrolled DM-2 with hyperglycemia: A1c 8.9% on 12/25/2019.  On Metformin 1000 mg twice daily at home. No results for input(s): GLUCAP in the last 72 hours. -Start CBG monitoring and SSI-moderate  History of tachybradycardia syndrome s/p PPM -Per cardiology  Recent COVID-19 infection: Tested positive on 4/12. -No isolation/precaution indicated  Rheumatoid arthritis: On Arava at home. -Arava on hold out of concern for pneumonia  Anemia of chronic disease: Recent Baseline Hgb 10-11> 10.5 (admit)> 10.1 -Check anemia panel -Continue monitoring  Hypothyroidism -Continue home Synthroid  Hypomagnesemia: Mg 0.6 -3 g IV magnesium ordered -Recheck in the afternoon  Hypocalcemia-corrects to 8.3. -Received calcium supplementation as well.  Debility: Has been using walker since discharge from hospital after COVID-19 hospitalization. -PT/OT eval            DVT prophylaxis: On heparin drip for A. fib Code Status: Full code Family Communication: Patient and/or RN. Available if any question.  Status is: Inpatient  Remains inpatient appropriate because:Hemodynamically unstable, Ongoing diagnostic testing needed not appropriate for outpatient work up and IV treatments appropriate due to intensity of illness or inability to take PO   Dispo: The patient is from: Home              Anticipated d/c is to: Home              Anticipated d/c date is: 3 days              Patient currently is not medically stable to d/c.        Consultants:  Cardiology IR Nephrology   Sch Meds:  Scheduled Meds:  atorvastatin  10 mg Oral q1800   calcium-vitamin D  1 tablet Oral TID   carvedilol   18.75 mg Oral BID WC   diltiazem  360 mg Oral Daily   folic acid  1 mg Oral BID   insulin aspart  0-15 Units Subcutaneous TID WC   insulin aspart  0-5 Units Subcutaneous QHS   levothyroxine  50 mcg Oral QAC breakfast   lidocaine       pantoprazole  40 mg Oral Daily   vitamin B-12  500 mcg Oral Daily   Continuous Infusions:  azithromycin 500 mg (01/17/20 2234)   cefTRIAXone (ROCEPHIN)  IV 2 g (01/17/20 2147)   heparin 1,100 Units/hr (01/18/20 0123)   PRN Meds:.acetaminophen, ondansetron **OR** ondansetron (ZOFRAN) IV  Antimicrobials: Anti-infectives (From admission, onward)   Start     Dose/Rate Route Frequency Ordered Stop   01/17/20 2200  cefTRIAXone (ROCEPHIN) 2 g in sodium chloride 0.9 % 100 mL IVPB     2 g 200 mL/hr over 30 Minutes Intravenous Every 24 hours 01/17/20 0251 01/22/20 2159   01/17/20 2200  azithromycin (ZITHROMAX)  500 mg in sodium chloride 0.9 % 250 mL IVPB     500 mg 250 mL/hr over 60 Minutes Intravenous Every 24 hours 01/17/20 0251 01/22/20 2159   01/17/20 0030  cefTRIAXone (ROCEPHIN) 1 g in sodium chloride 0.9 % 100 mL IVPB     1 g 200 mL/hr over 30 Minutes Intravenous  Once 01/17/20 0016 01/17/20 0223   01/17/20 0030  azithromycin (ZITHROMAX) 500 mg in sodium chloride 0.9 % 250 mL IVPB     500 mg 250 mL/hr over 60 Minutes Intravenous  Once 01/17/20 0016 01/17/20 0323       I have personally reviewed the following labs and images: CBC: Recent Labs  Lab 01/16/20 2344 01/17/20 0707  WBC 9.3 6.2  NEUTROABS 6.3 5.3  HGB 10.5* 10.1*  HCT 33.9* 32.2*  MCV 96.6 95.5  PLT 263 265   BMP &GFR Recent Labs  Lab 01/14/20 1003 01/16/20 2344 01/17/20 0707 01/18/20 0342  NA 139 136 138 135  K 4.6 4.2 4.8 4.2  CL 97* 100 97* 99  CO2 27 28 26 25   GLUCOSE 143* 103* 166* 313*  BUN 22 21 21  34*  CREATININE 1.40* 1.63* 1.62* 2.19*  CALCIUM 8.1* 6.9* 7.0* 6.3*  MG  --   --   --  0.6*   Estimated Creatinine Clearance: 20.3 mL/min (A) (by C-G  formula based on SCr of 2.19 mg/dL (H)). Liver & Pancreas: Recent Labs  Lab 01/16/20 2344 01/17/20 0707  AST 19 29  ALT 15 14  ALKPHOS 88 85  BILITOT 0.6 1.3*  PROT 6.0* 5.9*  ALBUMIN 1.7* 1.6*   No results for input(s): LIPASE, AMYLASE in the last 168 hours. No results for input(s): AMMONIA in the last 168 hours. Diabetic: No results for input(s): HGBA1C in the last 72 hours. No results for input(s): GLUCAP in the last 168 hours. Cardiac Enzymes: No results for input(s): CKTOTAL, CKMB, CKMBINDEX, TROPONINI in the last 168 hours. No results for input(s): PROBNP in the last 8760 hours. Coagulation Profile: No results for input(s): INR, PROTIME in the last 168 hours. Thyroid Function Tests: Recent Labs    01/17/20 0356  TSH 5.118*   Lipid Profile: No results for input(s): CHOL, HDL, LDLCALC, TRIG, CHOLHDL, LDLDIRECT in the last 72 hours. Anemia Panel: No results for input(s): VITAMINB12, FOLATE, FERRITIN, TIBC, IRON, RETICCTPCT in the last 72 hours. Urine analysis:    Component Value Date/Time   COLORURINE AMBER (A) 12/03/2019 1944   APPEARANCEUR HAZY (A) 12/03/2019 1944   LABSPEC 1.025 12/03/2019 1944   PHURINE 5.0 12/03/2019 1944   GLUCOSEU NEGATIVE 12/03/2019 1944   HGBUR NEGATIVE 12/03/2019 1944   BILIRUBINUR NEGATIVE 12/03/2019 1944   KETONESUR NEGATIVE 12/03/2019 1944   PROTEINUR 100 (A) 12/03/2019 1944   UROBILINOGEN 1.0 03/16/2014 0628   NITRITE NEGATIVE 12/03/2019 1944   LEUKOCYTESUR MODERATE (A) 12/03/2019 1944   Sepsis Labs: Invalid input(s): PROCALCITONIN, Roxana  Microbiology: Recent Results (from the past 240 hour(s))  SARS Coronavirus 2 by RT PCR (hospital order, performed in [facility] hospital lab) Nasopharyngeal Nasopharyngeal Swab     Status: None   Collection Time: 01/17/20  5:24 PM   Specimen: Nasopharyngeal Swab  Result Value Ref Range Status   SARS Coronavirus 2 NEGATIVE NEGATIVE Final    Comment: (NOTE) SARS-CoV-2 target nucleic  acids are NOT DETECTED. The SARS-CoV-2 RNA is generally detectable in upper and lower respiratory specimens during the acute phase of infection. The lowest concentration of SARS-CoV-2 viral copies this assay  can detect is 250 copies / mL. A negative result does not preclude SARS-CoV-2 infection and should not be used as the sole basis for treatment or other patient management decisions.  A negative result may occur with improper specimen collection / handling, submission of specimen other than nasopharyngeal swab, presence of viral mutation(s) within the areas targeted by this assay, and inadequate number of viral copies (<250 copies / mL). A negative result must be combined with clinical observations, patient history, and epidemiological information. Fact Sheet for Patients:   StrictlyIdeas.no Fact Sheet for Healthcare Providers: BankingDealers.co.za This test is not yet approved or cleared  by the [place] FDA and has been authorized for detection and/or diagnosis of SARS-CoV-2 by FDA under an Emergency Use Authorization (EUA).  This EUA will remain in effect (meaning this test can be used) for the duration of the COVID-19 declaration under Section 564(b)(1) of the Act, 21 U.S.C. section 360bbb-3(b)(1), unless the authorization is terminated or revoked sooner. Performed at [facility], [place] [address], [place], [place] [address]     Radiology Studies: DG Chest 1 View  Result Date: 01/18/2020 CLINICAL DATA:  Status post left thoracentesis EXAM: CHEST  1 VIEW COMPARISON:  01/16/2020 FINDINGS: Cardiac shadow is stable. Pacing device is again seen. Bibasilar infiltrative changes are again seen. Reduction in left pleural effusion is noted following thoracentesis. No pneumothorax is seen. IMPRESSION: No evidence of pneumothorax following thoracentesis. Persistent bibasilar opacities are seen. Electronically Signed   By: Inez Catalina  M.D.   On: 01/18/2020 11:58   ECHOCARDIOGRAM COMPLETE  Result Date: 01/18/2020    ECHOCARDIOGRAM REPORT   Patient Name:   [name] Date of Exam: 01/18/2020 Medical Rec #:  [id number]     Height:       58.0 in Accession #:    [id number]    Weight:       184.4 lb Date of Birth:  1946-01-29    BSA:          1.759 m Patient Age:    22 years      BP:           123/73 mmHg Patient Gender: F             HR:           110 bpm. Exam Location:  Inpatient Procedure: 2D Echo, Cardiac Doppler and Color Doppler Indications:    CHF  History:        Patient has prior history of Echocardiogram examinations, most                 recent 03/04/2019. Pacemaker, Arrythmias:Atrial Fibrillation,                 Signs/Symptoms:Shortness of Breath; Risk Factors:Hypertension,                 Diabetes and Dyslipidemia. LE edema, Covid, PNA.  Sonographer:    Dustin Flock Referring Phys: Roland  1. Left ventricular ejection fraction, by estimation, is 55 to 60%. The left ventricle has normal function. The left ventricle demonstrates regional wall motion abnormalities (see scoring diagram/findings for description). Paradoxical septal motion due to RV pacemaker. There is mild left ventricular hypertrophy. Left ventricular diastolic function could not be evaluated.  2. Right ventricular systolic function is normal. The right ventricular size is normal. There is normal pulmonary artery systolic pressure.  3. Large pleural effusion in the left lateral region.  4. The  mitral valve is abnormal. Trivial mitral valve regurgitation.  5. The aortic valve is tricuspid. Aortic valve regurgitation is mild. Mild aortic valve sclerosis is present, with no evidence of aortic valve stenosis.  6. The inferior vena cava is normal in size with greater than 50% respiratory variability, suggesting right atrial pressure of 3 mmHg. Comparison(s): Changes from prior study are noted. 03/04/2019: LVEF 60-65%. FINDINGS  Left Ventricle:  Left ventricular ejection fraction, by estimation, is 55 to 60%. The left ventricle has normal function. The left ventricle demonstrates regional wall motion abnormalities. The left ventricular internal cavity size was normal in size. There is mild left ventricular hypertrophy. Abnormal (paradoxical) septal motion, consistent with RV pacemaker. Left ventricular diastolic function could not be evaluated due to atrial fibrillation. Left ventricular diastolic function could not be evaluated. Right Ventricle: The right ventricular size is normal. No increase in right ventricular wall thickness. Right ventricular systolic function is normal. There is normal pulmonary artery systolic pressure. The tricuspid regurgitant velocity is 2.80 m/s, and  with an assumed right atrial pressure of 3 mmHg, the estimated right ventricular systolic pressure is [id number] mmHg. Left Atrium: Left atrial size was normal in size. Right Atrium: Right atrial size was normal in size. Pericardium: There is no evidence of pericardial effusion. Mitral Valve: The mitral valve is abnormal. There is mild thickening of the mitral valve leaflet(s). Trivial mitral valve regurgitation. Tricuspid Valve: The tricuspid valve is grossly normal. Tricuspid valve regurgitation is mild. Aortic Valve: The aortic valve is tricuspid. Aortic valve regurgitation is mild. Aortic regurgitation PHT measures 287 msec. Mild aortic valve sclerosis is present, with no evidence of aortic valve stenosis. Pulmonic Valve: The pulmonic valve was normal in structure. Pulmonic valve regurgitation is not visualized. Aorta: The aortic root and ascending aorta are structurally normal, with no evidence of dilitation. Venous: The inferior vena cava is normal in size with greater than 50% respiratory variability, suggesting right atrial pressure of 3 mmHg. IAS/Shunts: No atrial level shunt detected by color flow Doppler. Additional Comments: A pacer wire is visualized. There is a large  pleural effusion in the left lateral region.  LEFT VENTRICLE PLAX 2D LVIDd:         3.30 cm  Diastology LVIDs:         2.60 cm  LV e' lateral:   6.42 cm/s LV PW:         1.10 cm  LV E/e' lateral: 12.9 LV IVS:        1.10 cm  LV e' medial:    8.81 cm/s LVOT diam:     2.00 cm  LV E/e' medial:  9.4 LV SV:         51 LV SV Index:   29 LVOT Area:     3.14 cm  RIGHT VENTRICLE RV Basal diam:  2.20 cm RV S prime:     13.80 cm/s TAPSE (M-mode): 2.5 cm LEFT ATRIUM           Index       RIGHT ATRIUM           Index LA diam:      4.10 cm 2.33 cm/m  RA Area:     16.90 cm LA Vol (A4C): 53.1 ml 30.18 ml/m RA Volume:   43.40 ml  24.67 ml/m  AORTIC VALVE LVOT Vmax:   79.60 cm/s LVOT Vmean:  64.200 cm/s LVOT VTI:    0.163 m AI PHT:      287 msec  AORTA  Ao Root diam: 2.30 cm MITRAL VALVE               TRICUSPID VALVE MV Area (PHT): 3.99 cm    TR Peak grad:   31.4 mmHg MV Decel Time: 190 msec    TR Vmax:        280.00 cm/s MV E velocity: 83.10 cm/s MV A velocity: 42.40 cm/s  SHUNTS MV E/A ratio:  1.96        Systemic VTI:  0.16 m                            Systemic Diam: 2.00 cm Lyman Bishop MD Electronically signed by Lyman Bishop MD Signature Date/Time: 01/18/2020/10:28:37 AM    Final      Mckynleigh Mussell T. [place]  If 7PM-7AM, please contact night-coverage [email or web address] Password [facility] 01/18/2020, 12:57 PM

## 2020-01-18 NOTE — Progress Notes (Signed)
  Echocardiogram 2D Echocardiogram has been performed.  [name] 01/18/2020, 9:08 AM

## 2020-01-18 NOTE — Plan of Care (Signed)

## 2020-01-18 NOTE — Progress Notes (Addendum)
Progress Note  Patient Name: [name] Date of Encounter: 01/18/2020  Primary Cardiologist: Quay Burow, MD   Subjective   No acute overnight events. Patient still short of breath with some orthopnea overnight but states breathing is a little better from yesterday. She had some chest heaviness when laying down flat but none with sitting upright. She notes heart racing and some lightheadedness/dizziness if she gets up but no falls or syncope.  Inpatient Medications    Scheduled Meds:  atorvastatin  10 mg Oral q1800   calcium-vitamin D  1 tablet Oral TID   carvedilol  18.75 mg Oral BID WC   diltiazem  360 mg Oral Daily   folic acid  1 mg Oral BID   furosemide  20 mg Intravenous Daily   levothyroxine  50 mcg Oral QAC breakfast   magnesium oxide  1,200 mg Oral BID   pantoprazole  40 mg Oral Daily   vitamin B-12  500 mcg Oral Daily   Continuous Infusions:  azithromycin 500 mg (01/17/20 2234)   cefTRIAXone (ROCEPHIN)  IV 2 g (01/17/20 2147)   heparin 1,100 Units/hr (01/18/20 0123)   magnesium sulfate bolus IVPB 3 g (01/18/20 0708)   PRN Meds: acetaminophen, ondansetron **OR** ondansetron (ZOFRAN) IV   Vital Signs    Vitals:   01/17/20 1620 01/18/20 0030 01/18/20 0430 01/18/20 0500  BP: 103/66 (!) 96/46 (!) 96/58   Pulse: (!) 102 86 80   Resp: 20 18    Temp: 97.7 F (36.5 C) 98.1 F (36.7 C) 98.2 F (36.8 C)   TempSrc: Oral Oral    SpO2:  91% (!) 88%   Weight:    83.6 kg  Height:        Intake/Output Summary (Last 24 hours) at 01/18/2020 0731 Last data filed at 01/17/2020 1700 Gross per 24 hour  Intake 5.15 ml  Output 400 ml  Net -394.85 ml   Last 3 Weights 01/18/2020 01/17/2020 01/15/2020  Weight (lbs) 184 lb 6.4 oz 182 lb 14.4 oz 186 lb  Weight (kg) 83.643 kg 82.963 kg 84.369 kg      Telemetry    Atrial fibrillation with baseline rates in the 90's to 110's but occasionally in the 120's to 130's (this may be with ambulation). - Personally  Reviewed  ECG    No new ECG tracing today. - Personally Reviewed  Physical Exam   GEN: No acute distress. On supplemental O2 via nasal cannula. Neck: Supple. No JVD Cardiac: Mildly tachycardic with irregular rhythm. No murmurs, rubs, or gallops. Radial pulses and distal pedal pulses 2+ and equal bilaterally. Respiratory: No increased work of breathing. Diminished breath sounds up 2/3 of left side of back. No significant crackles, wheezes, or rhonchi noted. GI: Soft, non-distended, and non-tender. Bowel sounds present. MS: 2+ bilateral lower extremity edema. Mild non-pitting edema of upper extremities as well. No deformity. Neuro:  No focal deficits. Psych: Normal affect. Responds appropriately.   Labs    High Sensitivity Troponin:   Recent Labs  Lab 01/17/20 0356 01/17/20 0707  TROPONINIHS 7 6      Chemistry Recent Labs  Lab 01/16/20 2344 01/17/20 0707 01/18/20 0342  NA 136 138 135  K 4.2 4.8 4.2  CL 100 97* 99  CO2 28 26 25   GLUCOSE 103* 166* 313*  BUN 21 21 34*  CREATININE 1.63* 1.62* 2.19*  CALCIUM 6.9* 7.0* 6.3*  PROT 6.0* 5.9*  --   ALBUMIN 1.7* 1.6*  --   AST 19  29  --   ALT 15 14  --   ALKPHOS 88 85  --   BILITOT 0.6 1.3*  --   GFRNONAA 31* 31* 21*  GFRAA 35* 36* 25*  ANIONGAP 8 15 11      Hematology Recent Labs  Lab 01/16/20 2344 01/17/20 0707  WBC 9.3 6.2  RBC 3.51* 3.37*  HGB 10.5* 10.1*  HCT 33.9* 32.2*  MCV 96.6 95.5  MCH 29.9 30.0  MCHC 31.0 31.4  RDW 16.6* 16.6*  PLT 263 265    BNP Recent Labs  Lab 01/16/20 2344  BNP 526.5*     DDimer No results for input(s): DDIMER in the last 168 hours.   Radiology    DG Chest 2 View  Result Date: 01/16/2020 CLINICAL DATA:  Cough. EXAM: CHEST - 2 VIEW COMPARISON:  Most recent radiograph 2 days ago 01/14/2020, additional priors. FINDINGS: Left-sided pacemaker remains in place. Similar appearance of moderate size left pleural effusion and adjacent basilar opacity. Worsening patchy opacity  in the right mid lower lung zone. Suspected small right pleural effusion. Unchanged heart size and mediastinal contours, left heart border partially obscured by adjacent pleural effusion. No pneumothorax. Chronic change of the left shoulder. IMPRESSION: 1. Similar appearance of moderate size left pleural effusion and adjacent basilar opacity over the past 2 days. 2. Worsening patchy opacity in the right mid and lower lung zone, suspicious for pneumonia. Possible small right pleural effusion. Electronically Signed   By: Keith Rake M.D.   On: 01/16/2020 22:43    Cardiac Studies   Echo pending.  Patient Profile     74 y.o. 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 Hal Hope.  Assessment & Plan    Persistent Atrial Fibrillation with RVR - In the setting of persistent pneumonia after recent hospitalization for COVID pneumonia). - Telemetry shows atrial fibrillation with baseline rates in the 90's to low 110's.  - Magnesium low as below. Being repleted. - Potassium normal.  - TSH 5.118. Will check free T4 and free T3.   - Continue Coreg 18.75mg  twice daily and Cardizem 360mg  daily. BP soft so there is no room to up-titrate these. Suspect rates will improve as underlying respiratory condition improves.  - Home Eliquis on hold and patient on IV Heparin in preparation for thoracentesis.  - Can consider TEE/DCCV once patient's respiratory condition improves. - Will recheck EKG today given low Mag.  Acute Diastolic CHF - BNP elevated at 526.  - Chest x-ray concerning for multilobar pneumonia and also showed interval development of moderate to large left pleural effusion with atelectasis and/or consolidation in the left lung base.  - Echo showed  pending.  - Currently on IV Lasix. Only 400cc of documented urinary output in the past 24 hours. Creatinine up from 1.62 yesterday to 2.19 today.  - Will hold IV Laxis for now and discuss additional diuresis with MD. - Recommend compression stocking tos help with lower extremity edema. - Continue to monitor daily weights, strict I/O's, and renal function.  Pneumonia/Worsening Pleural Effusion - Recent COVID-19 infection. Tested positive on 4/12. No isolation/precaution noted. - Chest x-ray as above. - Continue antibiotics per primary team. - IR thoracentesis has been ordered.   Hypertension - History of hypertension but BP has been soft at  times. Most recent BP 120/73 but 96/58 earlier this morning. She notes some lightheadedness/dizziness with ambulation.  - Continue Coreg and Cardizem as above. - Home Losartan on hold.  Hyperlipidemia - Continue home statin.  Acute on CKD Stage III - Creatinine 2.19 today, up from 1.62 yesterday. Baseline 0.9 to 1.1.  - Avoid nephrotoxic agents.  - Continue to monitor.   Hypomagnesemia/ Hypocalcemia  - Magnesium 0.6 and Calcium 6.3 this morning. Being repleted by primary team. - Potassium 4.2.  - Continue to monitor. Will recheck Magnesium later today. Management per primary team.   For questions or updates, please contact [place] Please consult [email or web address] for contact info under        Signed, Darreld Mclean, PA-C  01/18/2020, 7:31 AM    Patient examined chart reviewed Exam with decreased BS left lung Telemetry with reasonable rate control of afib Eliquis on hold for thoracentesis today transition from heparin back to eliquis over weekend Hold lasix today as Cr has risen. Echo pending for RV/LV function but large unilateral left effusion not likely primarily CHF  Jenkins Rouge MD [facility]

## 2020-01-18 NOTE — Progress Notes (Signed)
[place] for heparin  Indication: atrial fibrillation  Allergies  Allergen Reactions  . Monopril [Fosinopril] Cough  . Magnesium-Containing Compounds Nausea Only and Other (See Comments)    Re: Mag-Ox at higher doses caused a lot of stomach upset (dose had to be decreased)  . Norvasc [Amlodipine Besylate] Other (See Comments)    10 mg dose caused LE edema- can tolerate 5 mg dose    Patient Measurements: Height: 4\' 10"  (147.3 cm) Weight: 83.6 kg (184 lb 6.4 oz) IBW/kg (Calculated) : 40.9   Vital Signs: Temp: 98.2 F (36.8 C) (05/28 0430) BP: 120/73 (05/28 0826) Pulse Rate: 110 (05/28 0826)  Labs: Recent Labs    01/16/20 2344 01/16/20 2344 01/17/20 0356 01/17/20 0707 01/18/20 0018 01/18/20 0342 01/18/20 1240 01/18/20 1244  HGB 10.5*  --   --  10.1*  --   --   --   --   HCT 33.9*  --   --  32.2*  --   --   --   --   PLT 263  --   --  265  --   --   --   --   APTT  --   --   --   --  103*  --  104*  --   HEPARINUNFRC  --   --   --   --  >2.20*  --   --   --   CREATININE 1.63*   < >  --  1.62*  --  2.19*  --  2.08*  TROPONINIHS  --   --  7 6  --   --   --   --    < > = values in this interval not displayed.    Estimated Creatinine Clearance: 21.4 mL/min (A) (by C-G formula based on SCr of 2.08 mg/dL (H)).   Assessment: 74 yo female with afib on apixaban (last dose was 5/27 at ~ 4am). Pharmacy consulted to dose heparin. Plans noted for possible TEE/DCCV.  PTT 104 sec after decrease to 1100 units.hr  Goal of Therapy:  Heparin level 0.3-0.7 units/ml aPTT 66-102 seconds Monitor platelets by anticoagulation protocol: Yes   Plan:  -Decrease heparin to 1000 units/hr  -Daily heparin level, aPTT and CBC  Hildred Laser, PharmD Clinical Pharmacist **Pharmacist phone directory can now be found on [email or web address] ([email or web address]).  Listed under [place].

## 2020-01-18 NOTE — Procedures (Signed)
Ultrasound-guided diagnostic and therapeutic left sided thoracentesis performed yielding 1 liters of serosanguinous  colored fluid. No immediate complications.   Diagnostic fluid was sent to the lab for further analysis. Follow-up chest x-ray pending. EBL is < 2 ml.

## 2020-01-18 NOTE — Telephone Encounter (Signed)
Noted  

## 2020-01-19 DIAGNOSIS — E8809 Other disorders of plasma-protein metabolism, not elsewhere classified: Secondary | ICD-10-CM

## 2020-01-19 LAB — CBC
HCT: 27.2 % — ABNORMAL LOW (ref 36.0–46.0)
Hemoglobin: 8.5 g/dL — ABNORMAL LOW (ref 12.0–15.0)
MCH: 29.7 pg (ref 26.0–34.0)
MCHC: 31.3 g/dL (ref 30.0–36.0)
MCV: 95.1 fL (ref 80.0–100.0)
Platelets: 224 10*3/uL (ref 150–400)
RBC: 2.86 MIL/uL — ABNORMAL LOW (ref 3.87–5.11)
RDW: 16.7 % — ABNORMAL HIGH (ref 11.5–15.5)
WBC: 10.3 10*3/uL (ref 4.0–10.5)
nRBC: 0 % (ref 0.0–0.2)

## 2020-01-19 LAB — RENAL FUNCTION PANEL
Albumin: 2.3 g/dL — ABNORMAL LOW (ref 3.5–5.0)
Anion gap: 12 (ref 5–15)
BUN: 36 mg/dL — ABNORMAL HIGH (ref 8–23)
CO2: 27 mmol/L (ref 22–32)
Calcium: 6.6 mg/dL — ABNORMAL LOW (ref 8.9–10.3)
Chloride: 99 mmol/L (ref 98–111)
Creatinine, Ser: 1.66 mg/dL — ABNORMAL HIGH (ref 0.44–1.00)
GFR calc Af Amer: 35 mL/min — ABNORMAL LOW (ref >60)
GFR calc non Af Amer: 30 mL/min — ABNORMAL LOW (ref >60)
Glucose, Bld: 194 mg/dL — ABNORMAL HIGH (ref 70–99)
Phosphorus: 1.8 mg/dL — ABNORMAL LOW (ref 2.5–4.6)
Potassium: 3.6 mmol/L (ref 3.5–5.1)
Sodium: 138 mmol/L (ref 135–145)

## 2020-01-19 LAB — ACID FAST SMEAR (AFB, MYCOBACTERIA): Acid Fast Smear: NEGATIVE

## 2020-01-19 LAB — GRAM STAIN

## 2020-01-19 LAB — LEGIONELLA PNEUMOPHILA SEROGP 1 UR AG: L. pneumophila Serogp 1 Ur Ag: NEGATIVE

## 2020-01-19 LAB — HEMOGLOBIN A1C
Hgb A1c MFr Bld: 8.4 % — ABNORMAL HIGH (ref 4.8–5.6)
Mean Plasma Glucose: 194.38 mg/dL

## 2020-01-19 LAB — VITAMIN B12: Vitamin B-12: 604 pg/mL (ref 180–914)

## 2020-01-19 LAB — APTT
aPTT: 47 seconds — ABNORMAL HIGH (ref 24–36)
aPTT: 68 seconds — ABNORMAL HIGH (ref 24–36)

## 2020-01-19 LAB — MAGNESIUM: Magnesium: 1.2 mg/dL — ABNORMAL LOW (ref 1.7–2.4)

## 2020-01-19 LAB — RETICULOCYTES
Immature Retic Fract: 14.9 % (ref 2.3–15.9)
RBC.: 2.96 MIL/uL — ABNORMAL LOW (ref 3.87–5.11)
Retic Count, Absolute: 95.3 10*3/uL (ref 19.0–186.0)
Retic Ct Pct: 3.2 % — ABNORMAL HIGH (ref 0.4–3.1)

## 2020-01-19 LAB — PROCALCITONIN: Procalcitonin: <0.1 ng/mL

## 2020-01-19 LAB — IRON AND TIBC
Iron: 48 ug/dL (ref 28–170)
Saturation Ratios: 31 % (ref 10.4–31.8)
TIBC: 155 ug/dL — ABNORMAL LOW (ref 250–450)
UIBC: 107 ug/dL

## 2020-01-19 LAB — HEMOGLOBIN AND HEMATOCRIT, BLOOD
HCT: 27 % — ABNORMAL LOW (ref 36.0–46.0)
Hemoglobin: 8.5 g/dL — ABNORMAL LOW (ref 12.0–15.0)

## 2020-01-19 LAB — FOLATE: Folate: 36.8 ng/mL (ref >5.9)

## 2020-01-19 LAB — GLUCOSE, CAPILLARY
Glucose-Capillary: 177 mg/dL — ABNORMAL HIGH (ref 70–99)
Glucose-Capillary: 137 mg/dL — ABNORMAL HIGH (ref 70–99)
Glucose-Capillary: 193 mg/dL — ABNORMAL HIGH (ref 70–99)
Glucose-Capillary: 165 mg/dL — ABNORMAL HIGH (ref 70–99)

## 2020-01-19 LAB — HEPARIN LEVEL (UNFRACTIONATED): Heparin Unfractionated: 1.34 IU/mL — ABNORMAL HIGH (ref 0.30–0.70)

## 2020-01-19 LAB — FERRITIN: Ferritin: 686 ng/mL — ABNORMAL HIGH (ref 11–307)

## 2020-01-19 LAB — UREA NITROGEN, URINE: Urea Nitrogen, Ur: 505 mg/dL

## 2020-01-19 LAB — LACTATE DEHYDROGENASE: LDH: 295 U/L — ABNORMAL HIGH (ref 98–192)

## 2020-01-19 MED ORDER — FUROSEMIDE 10 MG/ML IJ SOLN
40.0000 mg | Freq: Once | INTRAMUSCULAR | Status: AC
  Administered 2020-01-19: 40 mg via INTRAVENOUS
  Filled 2020-01-19: qty 4

## 2020-01-19 MED ORDER — METOPROLOL TARTRATE 50 MG PO TABS
50.0000 mg | ORAL_TABLET | Freq: Two times a day (BID) | ORAL | Status: DC
  Administered 2020-01-19: 50 mg via ORAL
  Filled 2020-01-19: qty 1

## 2020-01-19 MED ORDER — GUAIFENESIN-DM 100-10 MG/5ML PO SYRP
5.0000 mL | ORAL_SOLUTION | ORAL | Status: DC | PRN
  Administered 2020-01-19 – 2020-01-24 (×4): 5 mL via ORAL
  Filled 2020-01-19 (×4): qty 5

## 2020-01-19 MED ORDER — MAGNESIUM SULFATE 4 GM/100ML IV SOLN
4.0000 g | Freq: Once | INTRAVENOUS | Status: AC
  Administered 2020-01-19: 4 g via INTRAVENOUS
  Filled 2020-01-19: qty 100

## 2020-01-19 MED ORDER — POTASSIUM PHOSPHATES 15 MMOLE/5ML IV SOLN
30.0000 mmol | Freq: Once | INTRAVENOUS | Status: AC
  Administered 2020-01-19: 30 mmol via INTRAVENOUS
  Filled 2020-01-19: qty 10

## 2020-01-19 NOTE — Progress Notes (Signed)
Admit: 01/16/2020 LOS: 81  74 year old female with tachybradycardia syndrome status post pacemaker, atrial fibrillation, diabetes type 2, recent Covid pneumonia who presents with extensive volume overload, hypoalbuminemia, and AKI  Subjective:  . Patient notes some improvement in her shortness of breath today.  She also feels that her urine output was slightly better.  She denies any fevers or chills.  UPC returned very low.  Magnesium low this morning requiring a replacement  05/28 0701 - 05/29 0700 In: 822.8 [P.O.:240; I.V.:232.8; IV Piggyback:350] Out: 825 [Urine:825]  Filed Weights   01/17/20 0351 01/18/20 0500 01/19/20 0454  Weight: 83 kg 83.6 kg 84.1 kg    Scheduled Meds: . atorvastatin  10 mg Oral q1800  . calcium-vitamin D  1 tablet Oral TID  . diltiazem  360 mg Oral Daily  . folic acid  1 mg Oral BID  . insulin aspart  0-15 Units Subcutaneous TID WC  . insulin aspart  0-5 Units Subcutaneous QHS  . levothyroxine  50 mcg Oral QAC breakfast  . metoprolol tartrate  50 mg Oral BID  . pantoprazole  40 mg Oral Daily  . vitamin B-12  500 mcg Oral Daily   Continuous Infusions: . albumin human 25 g (01/19/20 1030)  . azithromycin 500 mg (01/19/20 0110)  . cefTRIAXone (ROCEPHIN)  IV 2 g (01/18/20 2342)  . heparin 1,050 Units/hr (01/19/20 1036)  . potassium PHOSPHATE IVPB (in mmol) 30 mmol (01/19/20 1252)   PRN Meds:.acetaminophen, ondansetron **OR** ondansetron (ZOFRAN) IV  Current Labs: reviewed    Physical Exam:  Blood pressure 116/76, pulse 90, temperature 97.6 F (36.4 C), temperature source Oral, resp. rate 16, height 4\' 10"  (1.473 m), weight 84.1 kg, SpO2 99 %. GEN: Sitting in bed, no distress ENT: no nasal discharge, mmm EYES: no scleral icterus, eomi CV: normal rate, no audible murmur PULM: no iwob, bilateral chest rise ABD: NABS, non-distended SKIN: no rashes or jaundice EXT: 2+ pitting edema in bilateral lower extremities, warm and well  perfused  A 74 year old female with tachybradycardia syndrome status post pacemaker, atrial fibrillation, diabetes type 2, recent Covid pneumonia who presents with extensive volume overload, hypoalbuminemia, and AKI  P 1. AKI on CKD: Mild elevation in creatinine in the past but mostly acute kidney injury.  Rising creatinine with diuresis and significantly low albumin.  UPC returned nearly normal taking nephrotic syndrome off the table.  Creatinine improved today to 1.6.  Given her improvement in kidney function with albumin alone I think her creatinine elevation was purely hemodynamic in nature related to her low albumin and diuresis.  Given her volume overload she does need continued diuresis and would continue albumin given it helped.   1. Continue IV albumin 25 g twice daily, can consider stopping in the coming days 2. Furosemide 40 mg IV given with morning albumin today.  Would redose as needed tomorrow if creatinine is stable 3. Agree with holding ARB 4. Continue to monitor intake and output as well as daily weights 5. Encourage p.o. intake 1. Hypoalbuminemia: Marked decreased albumin of 1.6.  Also some decrease during Covid hospitalization but now worse.  Has improved with albumin supplementation.  Previously low during Covid hospitalization in the twos but now much worse.    UPC normal making nephrotic syndrome unlikely.  It is possible she has a protein-losing enteropathy or decreased production from an unknown liver disease but most likely the decrease is simply a negative acute phase reactant with poor p.o. intake contributing.  We are supplementing as above  2. Possible pneumonia: Infiltrate on x-ray but was there previously.  Treating with CAP coverage empirically per primary team 3. Bilateral pleural effusions: Status post thoracentesis with improvement of her symptoms.    Studies consistent with transudate of process 4. Atrial fibrillation: With some RVR.  On rate controlling medications  and IV heparin.  Cardiology following 5. Hypoxic respiratory failure: Mostly chronic in nature mainly associated with Covid.  Weaning oxygen as able and diuresing as permitted by creatinine 6. Type 2 diabetes uncontrolled with hyperglycemia: Recent A1c around 9%.  Only on oral medications.  Needs to optimize control as able 7. Rheumatoid arthritis: On home medication with no signs of flare 8. Anemia of chronic disease: Minimal contribution from kidney disease.  No indication for ESA at this time. 9. Hypomagnesemia: Requiring repetitive IV magnesium supplementation.  Will consider stopping Protonix if this continues to be an issue. 10. Hypothyroidism: On home Synthroid . Medication Issues; o Preferred narcotic agents for pain control are hydromorphone, fentanyl, and methadone. Morphine should not be used.  o Baclofen should be avoided o Avoid oral sodium phosphate and magnesium citrate based laxatives / bowel preps    Santiago Bumpers, MD 01/19/2020, 2:18 PM  Recent Labs  Lab 01/18/20 0342 01/18/20 1244 01/19/20 0623  NA 135 137 138  K 4.2 3.5 3.6  CL 99 99 99  CO2 25 27 27   GLUCOSE 313* 265* 194*  BUN 34* 35* 36*  CREATININE 2.19* 2.08* 1.66*  CALCIUM 6.3* 6.4* 6.6*  PHOS  --  3.5 1.8*   Recent Labs  Lab 01/16/20 2344 01/17/20 0707 01/19/20 0623  WBC 9.3 6.2 10.3  NEUTROABS 6.3 5.3  --   HGB 10.5* 10.1* 8.5*  HCT 33.9* 32.2* 27.2*  MCV 96.6 95.5 95.1  PLT 263 265 224    Plan communicated to the primary team

## 2020-01-19 NOTE — Progress Notes (Signed)
PROGRESS NOTE  [name] [id number] DOB: July 29, 1946   PCP: Alycia Rossetti, MD  Patient is from: Home. Lives with daughter and grandchildren. Uses walker since COVID-19 infection.  DOA: 01/16/2020 LOS: 2  Brief Narrative / Interim history: 74 year old female with history of SSS/PPM, A. fib, DM-2, respiratory failure on 2 L after COVID-19 infection about a month ago. She presented with progressive shortness of breath for 1 week despite increased dose of Lasix by her cardiologist and Levaquin by PCP. He also had associated chest pressure.  In ED, HR in 120s. 95% on 3 L. Signs of fluid overload with JVD and BLE edema. CXR consistent with CHF, moderate left pleural effusion and possible right lung infiltrate. BNP elevated to 526. Cr 1.6. EKG A. fib with mild RVR. Received IV Lasix, empiric antibiotics for pneumonia and admitted for acute on chronic CHF, possible pneumonia and A. fib. Cardiology consulted.  Started on low-dose IV Lasix.  Renal function worse.  5/28-nephrology consulted.  Lasix discontinued.  Started on albumin.  Thoracocentesis with removal of 1 L transudative and culture negative fluid.    5/29-renal function improved.  Subjective: Seen and examined earlier this morning.  No major events overnight of this morning.  Feels better this morning.  She denies chest pain.  Breathing improved.  Still in RVR with heart rate in 120s.   Objective: Vitals:   01/19/20 0454 01/19/20 0753 01/19/20 0856 01/19/20 1009  BP: (!) 113/59 115/77 (!) 132/97 127/76  Pulse: (!) 105 (!) 115  (!) 120  Resp: 18 18  18   Temp: 98.2 F (36.8 C) 97.6 F (36.4 C)  98 F (36.7 C)  TempSrc: Oral Oral  Oral  SpO2: 90% 91% 98% 99%  Weight: 84.1 kg     Height:        Intake/Output Summary (Last 24 hours) at 01/19/2020 1239 Last data filed at 01/19/2020 0857 Gross per 24 hour  Intake 822.77 ml  Output 625 ml  Net 197.77 ml   Filed Weights   01/17/20 0351 01/18/20 0500 01/19/20 0454    Weight: 83 kg 83.6 kg 84.1 kg    Examination:  GENERAL: No apparent distress.  Nontoxic. HEENT: MMM.  Vision and hearing grossly intact.  NECK: Supple.  No apparent JVD.  RESP: On 2 L by [place].  No IWOB.  Fair aeration bilaterally. CVS:  RRR. Heart sounds normal.  ABD/GI/GU: BS+. Abd soft, NTND.  MSK/EXT:  Moves extremities. No apparent deformity.  2+ pitting BLE edema. SKIN: no apparent skin lesion or wound NEURO: Awake, alert and oriented appropriately.  No apparent focal neuro deficit. PSYCH: Calm. Normal affect.  Procedures:  -5/28-IR thoracocentesis with removal of 1 L transudative and culture negative fluid  Microbiology summarized: COVID-19 PCR negative. Pleural fluid culture NGTD. Pleural AFB and Fungal culture pending.   Assessment & Plan: Acute on chronic diastolic CHF: Echo with EF of 55 to 60%, RWMA, paradoxical septal motion due to RV pacemaker and large left pleural effusion.  Still with 2+ BLE edema.  825 cc UOP/24 hours off diuretics.  Net negative.  IV Lasix on hold due to renal function. -Cardiology and nephrology following and managing -Monitor fluid status, renal function and electrolytes -Sodium and fluid restriction -She is anxious about TED hose due to arthritis. Recommended leg elevation.  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 BUN 34. Seems she had ATN when she had a COVID-19 last month. Not on nephrotoxic meds  other than Lasix at home.  AKI likely due to diuretics in the setting of hypoperfusion due to A. fib with RVR and hypoalbuminemia.  AKI improved with albumin. UPC argues against nephrotic syndrome.  -Appreciate nephrology insight and guidance -Continue monitoring  Possible pneumonia: Clinically low suspicion but some radiologic evidence of right lung infiltrate.  This was also present on his CXR on 5/18 and 5/24.  Pro-Cal negative but might not be reliable while on Arava.  She has no fever or  leukocytosis.  CXR finding could be sequela of her recent COVID-19 infection. -Continue ceftriaxone and azithromycin empirically -Repeat chest x-ray in 3 to 4 weeks to ensure resolution  Bilateral pleural effusion, moderate on the left and small on the right-likely due to CHF and hypoalbuminemia.  -IR thoracocentesis on 5/28 with removal of 1 L transudative and culture negative fluid. -Received albumin  Persistent A. fib with RVR: Still with RVR with heart rate in 120s.. -On Cardizem.  Coreg changed to metoprolol.  Back on Eliquis per cardiology  Chronic respiratory failure due to COVID-19 infection-was discharged on 2 L last month.  Repeat CXR with persistent bibasilar opacities. -Wean oxygen as able -Incentive spirometry  Uncontrolled DM-2 with hyperglycemia: A1c 8.4%.On Metformin 1000 mg twice daily Recent Labs    01/18/20 2133 01/19/20 0749 01/19/20 1117  GLUCAP 157* 177* 137*  -Continue CBG monitoring and SSI-moderate  History of tachybradycardia syndrome s/p PPM -Per cardiology  Recent COVID-19 infection: Tested positive on 4/12. -No isolation/precaution indicated  Rheumatoid arthritis: On Arava at home. -Arava on hold out of concern for pneumonia  Anemia of chronic disease: Recent Baseline Hgb 10-11> 10.5 (admit)> 8.5 -Recheck H&H in the afternoon -Check anemia panel -Continue monitoring  Hypothyroidism -Continue home Synthroid  Hypomagnesemia: Mg 1.2. - IV magnesium sulfate 4 g x 1  Hypocalcemia-corrects to 8.0 -Per nephrology.  Debility: Has been using walker since discharge from hospital after COVID-19 hospitalization. -PT/OT eval            DVT prophylaxis: On heparin drip for A. fib Code Status: Full code Family Communication: Patient and/or RN. Available if any question.  Status is: Inpatient  Remains inpatient appropriate because:Hemodynamically unstable, Ongoing diagnostic testing needed not appropriate for outpatient work up and IV  treatments appropriate due to intensity of illness or inability to take PO   Dispo: The patient is from: Home              Anticipated d/c is to: Home              Anticipated d/c date is: 3 days              Patient currently is not medically stable to d/c.        Consultants:  Cardiology IR Nephrology   Sch Meds:  Scheduled Meds: . atorvastatin  10 mg Oral q1800  . calcium-vitamin D  1 tablet Oral TID  . diltiazem  360 mg Oral Daily  . folic acid  1 mg Oral BID  . insulin aspart  0-15 Units Subcutaneous TID WC  . insulin aspart  0-5 Units Subcutaneous QHS  . levothyroxine  50 mcg Oral QAC breakfast  . metoprolol tartrate  50 mg Oral BID  . pantoprazole  40 mg Oral Daily  . vitamin B-12  500 mcg Oral Daily   Continuous Infusions: . albumin human 25 g (01/19/20 1030)  . azithromycin 500 mg (01/19/20 0110)  . cefTRIAXone (ROCEPHIN)  IV 2 g (01/18/20 2342)  .  heparin 1,050 Units/hr (01/19/20 1036)  . potassium PHOSPHATE IVPB (in mmol)     PRN Meds:.acetaminophen, ondansetron **OR** ondansetron (ZOFRAN) IV  Antimicrobials: Anti-infectives (From admission, onward)   Start     Dose/Rate Route Frequency Ordered Stop   01/17/20 2200  cefTRIAXone (ROCEPHIN) 2 g in sodium chloride 0.9 % 100 mL IVPB     2 g 200 mL/hr over 30 Minutes Intravenous Every 24 hours 01/17/20 0251 01/22/20 2159   01/17/20 2200  azithromycin (ZITHROMAX) 500 mg in sodium chloride 0.9 % 250 mL IVPB     500 mg 250 mL/hr over 60 Minutes Intravenous Every 24 hours 01/17/20 0251 01/22/20 2159   01/17/20 0030  cefTRIAXone (ROCEPHIN) 1 g in sodium chloride 0.9 % 100 mL IVPB     1 g 200 mL/hr over 30 Minutes Intravenous  Once 01/17/20 0016 01/17/20 0223   01/17/20 0030  azithromycin (ZITHROMAX) 500 mg in sodium chloride 0.9 % 250 mL IVPB     500 mg 250 mL/hr over 60 Minutes Intravenous  Once 01/17/20 0016 01/17/20 0323       I have personally reviewed the following labs and images: CBC: Recent Labs   Lab 01/16/20 2344 01/17/20 0707 01/19/20 0623  WBC 9.3 6.2 10.3  NEUTROABS 6.3 5.3  --   HGB 10.5* 10.1* 8.5*  HCT 33.9* 32.2* 27.2*  MCV 96.6 95.5 95.1  PLT 263 265 224   BMP &GFR Recent Labs  Lab 01/16/20 2344 01/17/20 0707 01/18/20 0342 01/18/20 1244 01/19/20 0623  NA 136 138 135 137 138  K 4.2 4.8 4.2 3.5 3.6  CL 100 97* 99 99 99  CO2 28 26 25 27 27   GLUCOSE 103* 166* 313* 265* 194*  BUN 21 21 34* 35* 36*  CREATININE 1.63* 1.62* 2.19* 2.08* 1.66*  CALCIUM 6.9* 7.0* 6.3* 6.4* 6.6*  MG  --   --  0.6* 1.4* 1.2*  PHOS  --   --   --  3.5 1.8*   Estimated Creatinine Clearance: 26.9 mL/min (A) (by C-G formula based on SCr of 1.66 mg/dL (H)). Liver & Pancreas: Recent Labs  Lab 01/16/20 2344 01/17/20 0707 01/18/20 1244 01/19/20 0623  AST 19 29  --   --   ALT 15 14  --   --   ALKPHOS 88 85  --   --   BILITOT 0.6 1.3*  --   --   PROT 6.0* 5.9*  --   --   ALBUMIN 1.7* 1.6* 1.6* 2.3*   No results for input(s): LIPASE, AMYLASE in the last 168 hours. No results for input(s): AMMONIA in the last 168 hours. Diabetic: Recent Labs    01/19/20 0840  HGBA1C 8.4*   Recent Labs  Lab 01/18/20 1753 01/18/20 2133 01/19/20 0749 01/19/20 1117  GLUCAP 239* 157* 177* 137*   Cardiac Enzymes: No results for input(s): CKTOTAL, CKMB, CKMBINDEX, TROPONINI in the last 168 hours. No results for input(s): PROBNP in the last 8760 hours. Coagulation Profile: No results for input(s): INR, PROTIME in the last 168 hours. Thyroid Function Tests: Recent Labs    01/17/20 0356 01/18/20 1240  TSH 5.118*  --   FREET4  --  1.14*   Lipid Profile: No results for input(s): CHOL, HDL, LDLCALC, TRIG, CHOLHDL, LDLDIRECT in the last 72 hours. Anemia Panel: No results for input(s): VITAMINB12, FOLATE, FERRITIN, TIBC, IRON, RETICCTPCT in the last 72 hours. Urine analysis:    Component Value Date/Time   COLORURINE YELLOW 01/18/2020 1715   APPEARANCEUR  HAZY (A) 01/18/2020 1715   LABSPEC  1.013 01/18/2020 1715   PHURINE 5.0 01/18/2020 1715   GLUCOSEU NEGATIVE 01/18/2020 [place] 01/18/2020 [place] 01/18/2020 [place] 01/18/2020 [place] 01/18/2020 1715   UROBILINOGEN 1.0 03/16/2014 0628   NITRITE NEGATIVE 01/18/2020 1715   LEUKOCYTESUR LARGE (A) 01/18/2020 1715   Sepsis Labs: Invalid input(s): PROCALCITONIN, [place]  Microbiology: Recent Results (from the past 240 hour(s))  SARS Coronavirus 2 by RT PCR (hospital order, performed in [facility] hospital lab) Nasopharyngeal Nasopharyngeal Swab     Status: None   Collection Time: 01/17/20  5:24 PM   Specimen: Nasopharyngeal Swab  Result Value Ref Range Status   SARS Coronavirus 2 NEGATIVE NEGATIVE Final    Comment: (NOTE) SARS-CoV-2 target nucleic acids are NOT DETECTED. The SARS-CoV-2 RNA is generally detectable in upper and lower respiratory specimens during the acute phase of infection. The lowest concentration of SARS-CoV-2 viral copies this assay can detect is 250 copies / mL. A negative result does not preclude SARS-CoV-2 infection and should not be used as the sole basis for treatment or other patient management decisions.  A negative result may occur with improper specimen collection / handling, submission of specimen other than nasopharyngeal swab, presence of viral mutation(s) within the areas targeted by this assay, and inadequate number of viral copies (<250 copies / mL). A negative result must be combined with clinical observations, patient history, and epidemiological information. Fact Sheet for Patients:   StrictlyIdeas.no Fact Sheet for Healthcare Providers: BankingDealers.co.za This test is not yet approved or cleared  by the [place] FDA and has been authorized for detection and/or diagnosis of SARS-CoV-2 by FDA under an Emergency Use Authorization (EUA).  This EUA will  remain in effect (meaning this test can be used) for the duration of the COVID-19 declaration under Section 564(b)(1) of the Act, 21 U.S.C. section 360bbb-3(b)(1), unless the authorization is terminated or revoked sooner. Performed at [facility], [place] [address], [place], [place] [address]   Gram stain     Status: None   Collection Time: 01/18/20 11:15 AM   Specimen: Lung, Left; Pleural Fluid  Result Value Ref Range Status   Specimen Description PLEURAL FLUID  Final   Special Requests LEFT LUNG  Final   Gram Stain   Final    WBC PRESENT,BOTH PMN AND MONONUCLEAR NO ORGANISMS SEEN CYTOSPIN SMEAR Performed at [facility], [address], [place], [place] [address]    Report Status 01/19/2020 FINAL  Final  Culture, body fluid-bottle     Status: None (Preliminary result)   Collection Time: 01/18/20 11:15 AM   Specimen: Pleura  Result Value Ref Range Status   Specimen Description PLEURAL FLUID  Final   Special Requests LEFT LUNG  Final   Culture   Final    NO GROWTH < 24 HOURS Performed at [facility], [place] [address] [place],  [address]    Report Status PENDING  Incomplete    Radiology Studies: US RENAL  Result Date: 01/18/2020 CLINICAL DATA:  Acute kidney injury. EXAM: RENAL / URINARY TRACT ULTRASOUND COMPLETE COMPARISON:  Abdominal CT 07/11/2018 FINDINGS: Right Kidney: Renal measurements: 9.5 x 5.3 x 5.4 cm = volume: 145 mL. Renal measurements do not include a large exophytic cyst from the upper pole of the right kidney. Upper pole cyst measures 8.0 x 5.2 x 6.4 cm. There is a second cyst in  the mid kidney measuring 2.4 x 1.9 x 2.5 cm. Mild thinning of the renal parenchyma. Echogenicity within normal limits. No solid mass or hydronephrosis visualized. Trace perinephric fluid. Left Kidney: Renal measurements: 10.2 x 5.0 x 4.5 cm = volume: 120 mL. Mild thinning of the renal parenchyma. Echogenicity within normal limits. No mass or hydronephrosis visualized.  Bladder: Appears normal for degree of bladder distention. Other: Small volume ascites. IMPRESSION: 1. No obstructive uropathy. 2. Mild bilateral renal parenchymal thinning. 3. Right renal cyst, including a large exophytic cyst from the upper pole measuring 8 cm. Electronically Signed   By: Keith Rake M.D.   On: 01/18/2020 19:46     [place] [address] [place]  If 7PM-7AM, please contact night-coverage [email or web address] Password [facility] 01/19/2020, 12:39 PM

## 2020-01-19 NOTE — Evaluation (Signed)
Physical Therapy Evaluation Patient Details Name: [name] MRN: [id number] DOB: Sep 03, 1945 Today's Date: 01/19/2020   History of Present Illness  Pt is a 74 y/o female admitted secondary to SOB. Pt found to have acute respiratory failure with hypoxia likely a combination of CHF and Pneumonia. Of note, pt with recent +COVID one month ago. PMH including but not limited to a-fib, DM, HTN and RA.    Clinical Impression  Pt presented supine in bed with HOB elevated, awake and willing to participate in therapy session. Prior to admission, pt reported that she was using a rollator to ambulate and required some assistance from granddaughter to wash her hair (otherwise independent with ADLs). Pt lives with her family in a two level home (stays on the main level) with a ramped entrance. At the time of evaluation, pt very limited overall with mobility secondary to weakness, fatigue and SOB. Pt desaturating to as low as 83% on RA with activity, with quick recovery to low 90's once supplemental O2 reapplied. Pt stated that she was using supplemental O2 PRN prior to admission.   Pt would continue to benefit from skilled physical therapy services at this time while admitted and after d/c to address the below listed limitations in order to improve overall safety and independence with functional mobility.     Follow Up Recommendations Home health PT;Supervision/Assistance - 24 hour    Equipment Recommendations  None recommended by PT    Recommendations for Other Services       Precautions / Restrictions Precautions Precautions: Fall Precaution Comments: monitor SpO2 Restrictions Weight Bearing Restrictions: No      Mobility  Bed Mobility Overal bed mobility: Needs Assistance Bed Mobility: Supine to Sit     Supine to sit: Min guard     General bed mobility comments: increased time and effort, HOB elevated  Transfers Overall transfer level: Needs assistance Equipment used: Rolling  walker (2 wheeled) Transfers: Sit to/from Stand Sit to Stand: Min guard         General transfer comment: for safety  Ambulation/Gait Ambulation/Gait assistance: Min guard Gait Distance (Feet): 20 Feet Assistive device: Rolling walker (2 wheeled) Gait Pattern/deviations: Step-through pattern;Decreased stride length Gait velocity: decreased   General Gait Details: pt with slow, guarded and cautious gait; very limited secondary to fatigue and increased SOB  Stairs            Wheelchair Mobility    Modified Rankin (Stroke Patients Only)       Balance Overall balance assessment: Needs assistance Sitting-balance support: Feet supported Sitting balance-Leahy Scale: Good     Standing balance support: Bilateral upper extremity supported Standing balance-Leahy Scale: Poor                               Pertinent Vitals/Pain Pain Assessment: No/denies pain    Home Living Family/patient expects to be discharged to:: Private residence Living Arrangements: Other relatives Available Help at Discharge: Family Type of Home: House Home Access: Ramped entrance     Home Layout: Two level;Able to live on main level with bedroom/bathroom Home Equipment: Walker - 4 wheels;Shower seat      Prior Function Level of Independence: Independent with assistive device(s)         Comments: ambulates with rollator     Hand Dominance        Extremity/Trunk Assessment   Upper Extremity Assessment Upper Extremity Assessment: Overall WFL for tasks assessed  Lower Extremity Assessment Lower Extremity Assessment: Generalized weakness    Cervical / Trunk Assessment Cervical / Trunk Assessment: Kyphotic  Communication   Communication: No difficulties  Cognition Arousal/Alertness: Awake/alert Behavior During Therapy: WFL for tasks assessed/performed Overall Cognitive Status: Within Functional Limits for tasks assessed                                         General Comments      Exercises     Assessment/Plan    PT Assessment Patient needs continued PT services  PT Problem List Decreased strength;Decreased activity tolerance;Decreased balance;Decreased mobility;Decreased coordination;Cardiopulmonary status limiting activity       PT Treatment Interventions DME instruction;Gait training;Stair training;Functional mobility training;Therapeutic activities;Therapeutic exercise;Balance training;Neuromuscular re-education;Patient/family education    PT Goals (Current goals can be found in the Care Plan section)  Acute Rehab PT Goals Patient Stated Goal: "to go home" PT Goal Formulation: With patient Time For Goal Achievement: 02/02/20 Potential to Achieve Goals: Good    Frequency Min 3X/week   Barriers to discharge        Co-evaluation               AM-PAC PT "6 Clicks" Mobility  Outcome Measure Help needed turning from your back to your side while in a flat bed without using bedrails?: None Help needed moving from lying on your back to sitting on the side of a flat bed without using bedrails?: None Help needed moving to and from a bed to a chair (including a wheelchair)?: None Help needed standing up from a chair using your arms (e.g., wheelchair or bedside chair)?: None Help needed to walk in hospital room?: A Little Help needed climbing 3-5 steps with a railing? : A Lot 6 Click Score: 21    End of Session Equipment Utilized During Treatment: Oxygen Activity Tolerance: Patient limited by fatigue Patient left: in chair;with call bell/phone within reach Nurse Communication: Mobility status PT Visit Diagnosis: Other abnormalities of gait and mobility (R26.89);Muscle weakness (generalized) (M62.81)    Time: [id number] PT Time Calculation (min) (ACUTE ONLY): 24 min   Charges:   PT Evaluation $PT Eval Moderate Complexity: 1 Mod PT Treatments $Gait Training: 8-22 mins        Anastasio Champion,  DPT  Acute Rehabilitation Services Pager [phone] Office [place] 01/19/2020, 3:53 PM

## 2020-01-19 NOTE — Progress Notes (Signed)
[place] for heparin  Indication: atrial fibrillation  Allergies  Allergen Reactions  . Monopril [Fosinopril] Cough  . Magnesium-Containing Compounds Nausea Only and Other (See Comments)    Re: Mag-Ox at higher doses caused a lot of stomach upset (dose had to be decreased)  . Norvasc [Amlodipine Besylate] Other (See Comments)    10 mg dose caused LE edema- can tolerate 5 mg dose    Patient Measurements: Height: 4\' 10"  (147.3 cm) Weight: 84.1 kg (185 lb 6.5 oz) IBW/kg (Calculated) : 40.9   Vital Signs: Temp: 98 F (36.7 C) (05/29 1009) Temp Source: Oral (05/29 1009) BP: 127/76 (05/29 1009) Pulse Rate: 120 (05/29 1009)  Labs: Recent Labs    01/16/20 2344 01/16/20 2344 01/17/20 0356 01/17/20 0707 01/17/20 0707 01/18/20 0018 01/18/20 0342 01/18/20 1240 01/18/20 1244 01/19/20 0623 01/19/20 0840  HGB 10.5*   < >  --  10.1*  --   --   --   --   --  8.5*  --   HCT 33.9*  --   --  32.2*  --   --   --   --   --  27.2*  --   PLT 263  --   --  265  --   --   --   --   --  224  --   APTT  --   --   --   --   --  103*  --  104*  --   --  47*  HEPARINUNFRC  --   --   --   --   --  >2.20*  --   --   --   --  1.34*  CREATININE 1.63*   < >  --  1.62*   < >  --  2.19*  --  2.08* 1.66*  --   TROPONINIHS  --   --  7 6  --   --   --   --   --   --   --    < > = values in this interval not displayed.    Estimated Creatinine Clearance: 26.9 mL/min (A) (by C-G formula based on SCr of 1.66 mg/dL (H)).   Assessment: 74 yo female with afib on apixaban (last dose was 5/27 at ~ 4am). Pharmacy consulted to dose heparin. Plans noted for possible TEE/DCCV.  PTT low at 47sec after decrease to 1000 units.hr. Hg low and decreased from 10.1 to 8.5. No bleeding or issues with infusion per RN. Heparin level is falsely elevated at 1.34. Will continue to dose based on aPTT until heparin level and aPTT correlate.  Goal of Therapy:  Heparin level 0.3-0.7  units/ml aPTT 66-102 seconds Monitor platelets by anticoagulation protocol: Yes   Plan:  - Increase heparin infusion to 1050 units/hr  - Check repeat APTT in 8 hours - Daily heparin level, aPTT and CBC  Sherren Kerns, PharmD PGY1 Acute Care Pharmacy Resident **Pharmacist phone directory can now be found on [email or web address] ([email or web address]).  Listed under [place].

## 2020-01-19 NOTE — Progress Notes (Signed)
Progress Note  Patient Name: [name] Date of Encounter: 01/19/2020  Primary Cardiologist: Quay Burow, MD   Subjective   No CP; dyspnea improved following thoracentesis  Inpatient Medications    Scheduled Meds: . atorvastatin  10 mg Oral q1800  . calcium-vitamin D  1 tablet Oral TID  . carvedilol  18.75 mg Oral BID WC  . diltiazem  360 mg Oral Daily  . folic acid  1 mg Oral BID  . furosemide  40 mg Intravenous Once  . insulin aspart  0-15 Units Subcutaneous TID WC  . insulin aspart  0-5 Units Subcutaneous QHS  . levothyroxine  50 mcg Oral QAC breakfast  . pantoprazole  40 mg Oral Daily  . vitamin B-12  500 mcg Oral Daily   Continuous Infusions: . albumin human 25 g (01/18/20 2124)  . azithromycin 500 mg (01/19/20 0110)  . cefTRIAXone (ROCEPHIN)  IV 2 g (01/18/20 2342)  . heparin 1,000 Units/hr (01/18/20 1510)  . magnesium sulfate bolus IVPB 4 g (01/19/20 0843)  . potassium PHOSPHATE IVPB (in mmol)     PRN Meds: acetaminophen, ondansetron **OR** ondansetron (ZOFRAN) IV   Vital Signs    Vitals:   01/18/20 2052 01/19/20 0454 01/19/20 0753 01/19/20 0856  BP: (!) 88/73 (!) 113/59 115/77 (!) 132/97  Pulse: 99 (!) 105 (!) 115   Resp: 20 18 18    Temp: 98.1 F (36.7 C) 98.2 F (36.8 C) 97.6 F (36.4 C)   TempSrc: Oral Oral Oral   SpO2: 90% 90% 91% 98%  Weight:  84.1 kg    Height:        Intake/Output Summary (Last 24 hours) at 01/19/2020 0925 Last data filed at 01/19/2020 0202 Gross per 24 hour  Intake 582.77 ml  Output 825 ml  Net -242.23 ml   Last 3 Weights 01/19/2020 01/18/2020 01/17/2020  Weight (lbs) 185 lb 6.5 oz 184 lb 6.4 oz 182 lb 14.4 oz  Weight (kg) 84.1 kg 83.643 kg 82.963 kg      Telemetry    Atrial fibrillation rate controlled- Personally Reviewed   Physical Exam   GEN: No acute distress.   Neck: No JVD Cardiac: irregular Respiratory: diminished BS LLL GI: Soft, nontender, non-distended  MS: No edema Neuro:  Nonfocal  Psych:  Normal affect   Labs    High Sensitivity Troponin:   Recent Labs  Lab 01/17/20 0356 01/17/20 0707  TROPONINIHS 7 6      Chemistry Recent Labs  Lab 01/16/20 2344 01/16/20 2344 01/17/20 0707 01/17/20 0707 01/18/20 0342 01/18/20 1244 01/19/20 0623  NA 136   < > 138   < > 135 137 138  K 4.2   < > 4.8   < > 4.2 3.5 3.6  CL 100   < > 97*   < > 99 99 99  CO2 28   < > 26   < > 25 27 27   GLUCOSE 103*   < > 166*   < > 313* 265* 194*  BUN 21   < > 21   < > 34* 35* 36*  CREATININE 1.63*   < > 1.62*   < > 2.19* 2.08* 1.66*  CALCIUM 6.9*   < > 7.0*   < > 6.3* 6.4* 6.6*  PROT 6.0*  --  5.9*  --   --   --   --   ALBUMIN 1.7*   < > 1.6*  --   --  1.6* 2.3*  AST 19  --  29  --   --   --   --   ALT 15  --  14  --   --   --   --   ALKPHOS 88  --  85  --   --   --   --   BILITOT 0.6  --  1.3*  --   --   --   --   GFRNONAA 31*   < > 31*   < > 21* 23* 30*  GFRAA 35*   < > 36*   < > 25* 26* 35*  ANIONGAP 8   < > 15   < > 11 11 12    < > = values in this interval not displayed.     Hematology Recent Labs  Lab 01/16/20 2344 01/17/20 0707 01/19/20 0623  WBC 9.3 6.2 10.3  RBC 3.51* 3.37* 2.86*  HGB 10.5* 10.1* 8.5*  HCT 33.9* 32.2* 27.2*  MCV 96.6 95.5 95.1  MCH 29.9 30.0 29.7  MCHC 31.0 31.4 31.3  RDW 16.6* 16.6* 16.7*  PLT 263 265 224    BNP Recent Labs  Lab 01/16/20 2344  BNP 526.5*     Radiology    DG Chest 1 View  Result Date: 01/18/2020 CLINICAL DATA:  Status post left thoracentesis EXAM: CHEST  1 VIEW COMPARISON:  01/16/2020 FINDINGS: Cardiac shadow is stable. Pacing device is again seen. Bibasilar infiltrative changes are again seen. Reduction in left pleural effusion is noted following thoracentesis. No pneumothorax is seen. IMPRESSION: No evidence of pneumothorax following thoracentesis. Persistent bibasilar opacities are seen. Electronically Signed   By: Inez Catalina M.D.   On: 01/18/2020 11:58   US RENAL  Result Date: 01/18/2020 CLINICAL DATA:  Acute kidney  injury. EXAM: RENAL / URINARY TRACT ULTRASOUND COMPLETE COMPARISON:  Abdominal CT 07/11/2018 FINDINGS: Right Kidney: Renal measurements: 9.5 x 5.3 x 5.4 cm = volume: 145 mL. Renal measurements do not include a large exophytic cyst from the upper pole of the right kidney. Upper pole cyst measures 8.0 x 5.2 x 6.4 cm. There is a second cyst in the mid kidney measuring 2.4 x 1.9 x 2.5 cm. Mild thinning of the renal parenchyma. Echogenicity within normal limits. No solid mass or hydronephrosis visualized. Trace perinephric fluid. Left Kidney: Renal measurements: 10.2 x 5.0 x 4.5 cm = volume: 120 mL. Mild thinning of the renal parenchyma. Echogenicity within normal limits. No mass or hydronephrosis visualized. Bladder: Appears normal for degree of bladder distention. Other: Small volume ascites. IMPRESSION: 1. No obstructive uropathy. 2. Mild bilateral renal parenchymal thinning. 3. Right renal cyst, including a large exophytic cyst from the upper pole measuring 8 cm. Electronically Signed   By: Keith Rake M.D.   On: 01/18/2020 19:46   ECHOCARDIOGRAM COMPLETE  Result Date: 01/18/2020    ECHOCARDIOGRAM REPORT   Patient Name:   [name] Date of Exam: 01/18/2020 Medical Rec #:  [id number]     Height:       58.0 in Accession #:    [id number]    Weight:       184.4 lb Date of Birth:  27-Apr-1946    BSA:          1.759 m Patient Age:    74 years      BP:           123/73 mmHg Patient Gender: F             HR:  110 bpm. Exam Location:  Inpatient Procedure: 2D Echo, Cardiac Doppler and Color Doppler Indications:    CHF  History:        Patient has prior history of Echocardiogram examinations, most                 recent 03/04/2019. Pacemaker, Arrythmias:Atrial Fibrillation,                 Signs/Symptoms:Shortness of Breath; Risk Factors:Hypertension,                 Diabetes and Dyslipidemia. LE edema, Covid, PNA.  Sonographer:    Dustin Flock Referring Phys: [place]  1. Left  ventricular ejection fraction, by estimation, is 55 to 60%. The left ventricle has normal function. The left ventricle demonstrates regional wall motion abnormalities (see scoring diagram/findings for description). Paradoxical septal motion due to RV pacemaker. There is mild left ventricular hypertrophy. Left ventricular diastolic function could not be evaluated.  2. Right ventricular systolic function is normal. The right ventricular size is normal. There is normal pulmonary artery systolic pressure.  3. Large pleural effusion in the left lateral region.  4. The mitral valve is abnormal. Trivial mitral valve regurgitation.  5. The aortic valve is tricuspid. Aortic valve regurgitation is mild. Mild aortic valve sclerosis is present, with no evidence of aortic valve stenosis.  6. The inferior vena cava is normal in size with greater than 50% respiratory variability, suggesting right atrial pressure of 3 mmHg. Comparison(s): Changes from prior study are noted. 03/04/2019: LVEF 60-65%. FINDINGS  Left Ventricle: Left ventricular ejection fraction, by estimation, is 55 to 60%. The left ventricle has normal function. The left ventricle demonstrates regional wall motion abnormalities. The left ventricular internal cavity size was normal in size. There is mild left ventricular hypertrophy. Abnormal (paradoxical) septal motion, consistent with RV pacemaker. Left ventricular diastolic function could not be evaluated due to atrial fibrillation. Left ventricular diastolic function could not be evaluated. Right Ventricle: The right ventricular size is normal. No increase in right ventricular wall thickness. Right ventricular systolic function is normal. There is normal pulmonary artery systolic pressure. The tricuspid regurgitant velocity is 2.80 m/s, and  with an assumed right atrial pressure of 3 mmHg, the estimated right ventricular systolic pressure is [id number] mmHg. Left Atrium: Left atrial size was normal in size. Right Atrium:  Right atrial size was normal in size. Pericardium: There is no evidence of pericardial effusion. Mitral Valve: The mitral valve is abnormal. There is mild thickening of the mitral valve leaflet(s). Trivial mitral valve regurgitation. Tricuspid Valve: The tricuspid valve is grossly normal. Tricuspid valve regurgitation is mild. Aortic Valve: The aortic valve is tricuspid. Aortic valve regurgitation is mild. Aortic regurgitation PHT measures 287 msec. Mild aortic valve sclerosis is present, with no evidence of aortic valve stenosis. Pulmonic Valve: The pulmonic valve was normal in structure. Pulmonic valve regurgitation is not visualized. Aorta: The aortic root and ascending aorta are structurally normal, with no evidence of dilitation. Venous: The inferior vena cava is normal in size with greater than 50% respiratory variability, suggesting right atrial pressure of 3 mmHg. IAS/Shunts: No atrial level shunt detected by color flow Doppler. Additional Comments: A pacer wire is visualized. There is a large pleural effusion in the left lateral region.  LEFT VENTRICLE PLAX 2D LVIDd:         3.30 cm  Diastology LVIDs:         2.60 cm  LV e' lateral:  6.42 cm/s LV PW:         1.10 cm  LV E/e' lateral: 12.9 LV IVS:        1.10 cm  LV e' medial:    8.81 cm/s LVOT diam:     2.00 cm  LV E/e' medial:  9.4 LV SV:         51 LV SV Index:   29 LVOT Area:     3.14 cm  RIGHT VENTRICLE RV Basal diam:  2.20 cm RV S prime:     13.80 cm/s TAPSE (M-mode): 2.5 cm LEFT ATRIUM           Index       RIGHT ATRIUM           Index LA diam:      4.10 cm 2.33 cm/m  RA Area:     16.90 cm LA Vol (A4C): 53.1 ml 30.18 ml/m RA Volume:   43.40 ml  24.67 ml/m  AORTIC VALVE LVOT Vmax:   79.60 cm/s LVOT Vmean:  64.200 cm/s LVOT VTI:    0.163 m AI PHT:      287 msec  AORTA Ao Root diam: 2.30 cm MITRAL VALVE               TRICUSPID VALVE MV Area (PHT): 3.99 cm    TR Peak grad:   31.4 mmHg MV Decel Time: 190 msec    TR Vmax:        280.00 cm/s MV E  velocity: 83.10 cm/s MV A velocity: 42.40 cm/s  SHUNTS MV E/A ratio:  1.96        Systemic VTI:  0.16 m                            Systemic Diam: 2.00 cm Lyman Bishop MD Electronically signed by Lyman Bishop MD Signature Date/Time: 01/18/2020/10:28:37 AM    Final    IR THORACENTESIS ASP PLEURAL SPACE W/IMG GUIDE  Result Date: 01/18/2020 INDICATION: Patient history of congestive heart failure with shortness of breath presents for therapeutic and diagnostic thoracentesis EXAM: ULTRASOUND GUIDED THERAPEUTIC AND DIAGNOSTIC THORACENTESIS MEDICATIONS: Lidocaine 1% 10 mL COMPLICATIONS: None immediate. PROCEDURE: An ultrasound guided thoracentesis was thoroughly discussed with the patient and questions answered. The benefits, risks, alternatives and complications were also discussed. The patient understands and wishes to proceed with the procedure. Written consent was obtained. Ultrasound was performed to localize and mark an adequate pocket of fluid in the left-sided chest. The area was then prepped and draped in the normal sterile fashion. 1% Lidocaine was used for local anesthesia. Under ultrasound guidance a 6 Fr Safe-T-Centesis catheter was introduced. Thoracentesis was performed. The catheter was removed and a dressing applied. FINDINGS: A total of approximately 1 L of serosanguineous fluid was removed. Samples were sent to the laboratory as requested by the clinical team. IMPRESSION: Successful ultrasound guided left-sided therapeutic and diagnostic thoracentesis yielding 1 L of pleural fluid. Read by: Rushie Nyhan, NP Electronically Signed   By: Aletta Edouard M.D.   On: 01/18/2020 15:45    Patient Profile     74 y.o. 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  Echocardiogram  this admission shows normal LV function and mild aortic insufficiency.  Assessment &  Plan    1 Persistent atrial fibrillation-this occurred in the setting of pneumonia and recent hospitalization for Covid.  Heart rate remains mildly elevated.  Blood pressure has been borderline.  Continue Cardizem at 360 mg daily.  Discontinue carvedilol and instead I will treat with metoprolol 50 mg twice daily (less blood pressure lowering effects).  We will change IV heparin back to apixaban 5 mg twice daily once all procedures complete.  Ultimately will likely require TEE guided cardioversion.  2 acute diastolic congestive heart failure/volume excess-possible contribution from atrial fibrillation.  Also likely contribution from low oncotic pressure related to hypoalbuminemia.  Lasix on hold due to worsening renal function.  Echocardiogram shows normal LV function.  We will continue to follow and diurese as needed.  3 acute on chronic stage III kidney disease-nephrology now following.  Some improvement this morning after holding Lasix.  4 hypertension-patient's blood pressure is controlled.  Medications changes as outlined above.  5 prior pacemaker  6 pleural effusion-status post thoracentesis.  Symptomatically improved.  Will await follow-up cultures.  7 pneumonia-continue antibiotics.  For questions or updates, please contact [place] Please consult [email or web address] for contact info under        Signed, Kirk Ruths, MD  01/19/2020, 9:25 AM

## 2020-01-19 NOTE — Progress Notes (Signed)
[facility] for heparin  Indication: atrial fibrillation  Allergies  Allergen Reactions  . Monopril [Fosinopril] Cough  . Magnesium-Containing Compounds Nausea Only and Other (See Comments)    Re: Mag-Ox at higher doses caused a lot of stomach upset (dose had to be decreased)  . Norvasc [Amlodipine Besylate] Other (See Comments)    10 mg dose caused LE edema- can tolerate 5 mg dose    Patient Measurements: Height: 4\' 10"  (147.3 cm) Weight: 84.1 kg (185 lb 6.5 oz) IBW/kg (Calculated) : 40.9   Vital Signs: Temp: 97.6 F (36.4 C) (05/29 1646) Temp Source: Oral (05/29 1646) BP: 130/71 (05/29 1646) Pulse Rate: 92 (05/29 1646)  Labs: Recent Labs    01/16/20 2344 01/16/20 2344 01/17/20 0356 01/17/20 0707 01/17/20 0707 01/18/20 0018 01/18/20 0018 01/18/20 0342 01/18/20 1240 01/18/20 1244 01/19/20 0623 01/19/20 0840 01/19/20 1527 01/19/20 1839  HGB 10.5*   < >  --  10.1*   < >  --   --   --   --   --  8.5*  --  8.5*  --   HCT 33.9*   < >  --  32.2*  --   --   --   --   --   --  27.2*  --  27.0*  --   PLT 263  --   --  265  --   --   --   --   --   --  224  --   --   --   APTT  --   --   --   --   --  103*   < >  --  104*  --   --  47*  --  68*  HEPARINUNFRC  --   --   --   --   --  >2.20*  --   --   --   --   --  1.34*  --   --   CREATININE 1.63*   < >  --  1.62*   < >  --   --  2.19*  --  2.08* 1.66*  --   --   --   TROPONINIHS  --   --  7 6  --   --   --   --   --   --   --   --   --   --    < > = values in this interval not displayed.    Estimated Creatinine Clearance: 26.9 mL/min (A) (by C-G formula based on SCr of 1.66 mg/dL (H)).   Assessment: 74 yo female with afib on apixaban (last dose was 5/27 at ~ 4am). Pharmacy consulted to dose heparin. Plans noted for possible TEE/DCCV.  aptt now at goal  68sec after increase to 1050 units.hr. Hg low and decreased from 10.1 to 8.5. No bleeding or issues with infusion. Heparin level is  falsely elevated at 1.34. Will continue to dose based on aPTT until heparin level and aPTT correlate.  Goal of Therapy:  Heparin level 0.3-0.7 units/ml aPTT 66-102 seconds Monitor platelets by anticoagulation protocol: Yes   Plan:  - Increase heparin infusion to 1100 units/hr  - Daily heparin level, aPTT and CBC  Erin Hearing PharmD., BCPS Clinical Pharmacist 01/19/2020 7:53 PM

## 2020-01-19 NOTE — Progress Notes (Signed)
   01/19/20 0753  Assess: MEWS Score  Temp 97.6 F (36.4 C)  BP 115/77  Pulse Rate (!) 115  ECG Heart Rate (!) 115  Resp 18  SpO2 91 %  O2 Device Nasal Cannula  Assess: MEWS Score  MEWS Temp 0  MEWS Systolic 0  MEWS Pulse 2  MEWS RR 0  MEWS LOC 0  MEWS Score 2  MEWS Score Color Yellow  Assess: if the MEWS score is Yellow or Red  Were vital signs taken at a resting state? Yes  Focused Assessment Documented focused assessment  Early Detection of Sepsis Score *See Row Information* Low  MEWS guidelines implemented *See Row Information* Yes  Take Vital Signs  Increase Vital Sign Frequency  Yellow: Q 2hr X 2 then Q 4hr X 2, if remains yellow, continue Q 4hrs  Escalate  MEWS: Escalate Yellow: discuss with charge nurse/RN and consider discussing with provider and RRT  Notify: Charge Nurse/RN  Name of Charge Nurse/RN Notified Stephanie, RN  Date Charge Nurse/RN Notified 01/19/20  Time Charge Nurse/RN Notified 0800  Document  Patient Outcome Other (Comment) (pt's stable, stay on unit)  Progress note created (see row info) Yes

## 2020-01-20 ENCOUNTER — Other Ambulatory Visit: Payer: Self-pay | Admitting: Family Medicine

## 2020-01-20 DIAGNOSIS — G2581 Restless legs syndrome: Secondary | ICD-10-CM

## 2020-01-20 LAB — GLUCOSE, CAPILLARY
Glucose-Capillary: 130 mg/dL — ABNORMAL HIGH (ref 70–99)
Glucose-Capillary: 155 mg/dL — ABNORMAL HIGH (ref 70–99)
Glucose-Capillary: 198 mg/dL — ABNORMAL HIGH (ref 70–99)
Glucose-Capillary: 132 mg/dL — ABNORMAL HIGH (ref 70–99)

## 2020-01-20 LAB — PH, BODY FLUID: pH, Body Fluid: 7.8

## 2020-01-20 LAB — RENAL FUNCTION PANEL
Albumin: 2.8 g/dL — ABNORMAL LOW (ref 3.5–5.0)
Anion gap: 14 (ref 5–15)
BUN: 25 mg/dL — ABNORMAL HIGH (ref 8–23)
CO2: 24 mmol/L (ref 22–32)
Calcium: 6.9 mg/dL — ABNORMAL LOW (ref 8.9–10.3)
Chloride: 100 mmol/L (ref 98–111)
Creatinine, Ser: 1.03 mg/dL — ABNORMAL HIGH (ref 0.44–1.00)
GFR calc Af Amer: >60 mL/min (ref >60)
GFR calc non Af Amer: 53 mL/min — ABNORMAL LOW (ref >60)
Glucose, Bld: 172 mg/dL — ABNORMAL HIGH (ref 70–99)
Phosphorus: 2.6 mg/dL (ref 2.5–4.6)
Potassium: 3.7 mmol/L (ref 3.5–5.1)
Sodium: 138 mmol/L (ref 135–145)

## 2020-01-20 LAB — CBC
HCT: 28.3 % — ABNORMAL LOW (ref 36.0–46.0)
Hemoglobin: 9 g/dL — ABNORMAL LOW (ref 12.0–15.0)
MCH: 30.3 pg (ref 26.0–34.0)
MCHC: 31.8 g/dL (ref 30.0–36.0)
MCV: 95.3 fL (ref 80.0–100.0)
Platelets: 197 10*3/uL (ref 150–400)
RBC: 2.97 MIL/uL — ABNORMAL LOW (ref 3.87–5.11)
RDW: 17 % — ABNORMAL HIGH (ref 11.5–15.5)
WBC: 9.6 10*3/uL (ref 4.0–10.5)
nRBC: 0 % (ref 0.0–0.2)

## 2020-01-20 LAB — APTT: aPTT: 68 seconds — ABNORMAL HIGH (ref 24–36)

## 2020-01-20 LAB — HEPARIN LEVEL (UNFRACTIONATED): Heparin Unfractionated: 1.04 IU/mL — ABNORMAL HIGH (ref 0.30–0.70)

## 2020-01-20 LAB — MAGNESIUM: Magnesium: 1.7 mg/dL (ref 1.7–2.4)

## 2020-01-20 MED ORDER — FUROSEMIDE 10 MG/ML IJ SOLN
40.0000 mg | Freq: Once | INTRAMUSCULAR | Status: AC
  Administered 2020-01-20: 40 mg via INTRAVENOUS
  Filled 2020-01-20: qty 4

## 2020-01-20 MED ORDER — APIXABAN 5 MG PO TABS
5.0000 mg | ORAL_TABLET | Freq: Two times a day (BID) | ORAL | Status: DC
  Administered 2020-01-20 – 2020-01-26 (×13): 5 mg via ORAL
  Filled 2020-01-20 (×13): qty 1

## 2020-01-20 MED ORDER — MAGNESIUM SULFATE 2 GM/50ML IV SOLN
2.0000 g | Freq: Once | INTRAVENOUS | Status: AC
  Administered 2020-01-20: 2 g via INTRAVENOUS
  Filled 2020-01-20: qty 50

## 2020-01-20 MED ORDER — METOPROLOL TARTRATE 50 MG PO TABS
75.0000 mg | ORAL_TABLET | Freq: Two times a day (BID) | ORAL | Status: DC
  Administered 2020-01-20 (×2): 75 mg via ORAL
  Filled 2020-01-20 (×2): qty 1

## 2020-01-20 MED ORDER — TRAMADOL HCL 50 MG PO TABS
50.0000 mg | ORAL_TABLET | Freq: Two times a day (BID) | ORAL | Status: DC | PRN
  Administered 2020-01-20 – 2020-01-25 (×8): 50 mg via ORAL
  Filled 2020-01-20 (×8): qty 1

## 2020-01-20 MED ORDER — POTASSIUM CHLORIDE CRYS ER 20 MEQ PO TBCR
40.0000 meq | EXTENDED_RELEASE_TABLET | Freq: Once | ORAL | Status: AC
  Administered 2020-01-20: 40 meq via ORAL
  Filled 2020-01-20: qty 2

## 2020-01-20 MED ORDER — POTASSIUM CHLORIDE CRYS ER 20 MEQ PO TBCR
40.0000 meq | EXTENDED_RELEASE_TABLET | Freq: Once | ORAL | Status: DC
  Filled 2020-01-20: qty 2

## 2020-01-20 MED ORDER — ROPINIROLE HCL 0.5 MG PO TABS
0.2500 mg | ORAL_TABLET | Freq: Every day | ORAL | Status: DC
  Administered 2020-01-20 – 2020-01-25 (×6): 0.25 mg via ORAL
  Filled 2020-01-20 (×6): qty 1

## 2020-01-20 NOTE — Plan of Care (Signed)

## 2020-01-20 NOTE — Progress Notes (Signed)
PROGRESS NOTE  [name] [id number] DOB: 05-17-1946   PCP: Alycia Rossetti, MD  Patient is from: Home. Lives with daughter and grandchildren. Uses walker since COVID-19 infection.  DOA: 01/16/2020 LOS: 3  Brief Narrative / Interim history: 74 year old female with history of SSS/PPM, A. fib, DM-2, respiratory failure on 2 L after COVID-19 infection about a month ago. She presented with progressive shortness of breath for 1 week despite increased dose of Lasix by her cardiologist and Levaquin by PCP. He also had associated chest pressure.  In ED, HR in 120s. 95% on 3 L. Signs of fluid overload with JVD and BLE edema. CXR consistent with CHF, moderate left pleural effusion and possible right lung infiltrate. BNP elevated to 526. Cr 1.6. EKG A. fib with mild RVR. Received IV Lasix, empiric antibiotics for pneumonia and admitted for acute on chronic CHF, possible pneumonia and A. fib. Cardiology consulted.  Started on low-dose IV Lasix.  Renal function worse.  5/28-nephrology consulted.  Lasix discontinued.  Started on albumin.  Thoracocentesis with removal of 1 L transudative and culture negative fluid.    Renal function improved significantly.  Restarted on IV Lasix by cardiology.  Subjective: Seen and examined earlier this morning.  No major events overnight of this morning.  Feels better from breathing standpoint but still with orthopnea.  Also reports difficulty sleeping and her legs jumping when she sleeps.  No history of restless leg syndrome.  Denies chest pain, GI or UTI symptoms.  2 unmeasured voids/24 hours.  Weight is slightly up.  Objective: Vitals:   01/20/20 0020 01/20/20 0452 01/20/20 0815 01/20/20 0958  BP: 99/67 127/75 (!) 148/98 127/84  Pulse: (!) 102 (!) 116 (!) 117   Resp: 18 19 18    Temp: 98.4 F (36.9 C) 98.5 F (36.9 C) 97.7 F (36.5 C)   TempSrc: Oral Oral Oral   SpO2: 99% 90% 90% 94%  Weight:  84.4 kg    Height:        Intake/Output Summary (Last  24 hours) at 01/20/2020 1257 Last data filed at 01/20/2020 0958 Gross per 24 hour  Intake 720 ml  Output --  Net 720 ml   Filed Weights   01/18/20 0500 01/19/20 0454 01/20/20 0452  Weight: 83.6 kg 84.1 kg 84.4 kg    Examination: GENERAL: No apparent distress.  Nontoxic. HEENT: MMM.  Vision and hearing grossly intact.  NECK: Supple.  No apparent JVD.  RESP: 90% on 2 L.  No IWOB.  Fair aeration bilaterally. CVS: Irregular rhythm.  HR 110-120.Marland Kitchen Heart sounds normal.  ABD/GI/GU: BS+. Abd soft, NTND.  MSK/EXT:  Moves extremities. No apparent deformity.  2+ pitting BLE edema. SKIN: no apparent skin lesion or wound NEURO: Awake, alert and oriented appropriately.  No apparent focal neuro deficit. PSYCH: Calm. Normal affect.  Procedures:  -5/28-IR thoracocentesis with removal of 1 L transudative and culture negative fluid  Microbiology summarized: COVID-19 PCR negative. Pleural fluid culture NGTD. Pleural AFB and Fungal culture pending.   Assessment & Plan: Acute on chronic diastolic CHF: Echo with EF of 55 to 60%, RWMA, paradoxical septal motion due to RV pacemaker and large left pleural effusion.  Still with 2+ BLE edema.  I&O not captured well.  Weight is slightly up. -Cardiology and nephrology managing-IV Lasix 40 mg x 1 per cardiology -Monitor fluid status, renal function and electrolytes -Sodium and fluid restriction -She is anxious about TED hose due to arthritis. Recommended leg elevation.  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 Seems she had ATN when she had a COVID-19 last month. Not on nephrotoxic meds other than Lasix at home.  AKI likely due to diuretics in the setting of hypoperfusion due to A. fib with RVR and hypoalbuminemia.  AKI improved with albumin. UPC low. -Appreciate nephrology insight and guidance -Continue monitoring now she is back on Lasix  Possible pneumonia: Clinically low suspicion but some  radiologic evidence of right lung infiltrate.  This was also present on his CXR on 5/18 and 5/24.  Pro-Cal negative but might not be reliable while on Arava.  She has no fever or leukocytosis.  CXR finding could be sequela of her recent COVID-19 infection. -Complete ceftriaxone and azithromycin empirically -Repeat chest x-ray in 3 to 4 weeks to ensure resolution  Bilateral pleural effusion, moderate on the left and small on the right-likely due to CHF and hypoalbuminemia.  -IR thoracocentesis on 5/28 with removal of 1 L transudative and culture negative fluid. -Received albumin.  Restarted on Lasix.  Persistent A. fib with RVR: Still with RVR with heart rate in 120s.. -On Cardizem.  Coreg changed to metoprolol.  Back on Eliquis per cardiology -TEE cardioversion down the road  Chronic respiratory failure due to COVID-19 infection-was discharged on 2 L last month.  Repeat CXR with persistent bibasilar opacities. -Wean oxygen as able -Incentive spirometry  Uncontrolled DM-2 with hyperglycemia: A1c 8.4%.On Metformin 1000 mg twice daily Recent Labs    01/19/20 2153 01/20/20 0805 01/20/20 1237  GLUCAP 165* 130* 155*  -Continue CBG monitoring and SSI-moderate  History of tachybradycardia syndrome s/p PPM -Per cardiology  Recent COVID-19 infection: Tested positive on 4/12. -No isolation/precaution indicated  Rheumatoid arthritis: On Arava at home. -Arava on hold out of concern for pneumonia  Anemia of chronic disease: B/l Hgb 10-11> 10.5 (admit)> 8.5>9.0.  Anemia panel consistent with ACD.  Denies melena or hematochezia. -Continue monitoring  Hypothyroidism -Continue home Synthroid  Hypomagnesemia: Mg 1.7 - IV magnesium sulfate 2 g x 1  Hypocalcemia-corrects to 8.0 -Per nephrology.  Debility: Has been using walker since discharge from hospital after COVID-19 hospitalization. -PT/OT eval  Restless leg syndrome -Will  try low-dose Requip            DVT prophylaxis: On  heparin drip for A. fib Code Status: Full code Family Communication: Patient and/or RN. Available if any question.  Status is: Inpatient  Remains inpatient appropriate because:Hemodynamically unstable, Ongoing diagnostic testing needed not appropriate for outpatient work up and IV treatments appropriate due to intensity of illness or inability to take PO   Dispo: The patient is from: Home              Anticipated d/c is to: Home              Anticipated d/c date is: 3 days              Patient currently is not medically stable to d/c.        Consultants:  Cardiology IR Nephrology   Sch Meds:  Scheduled Meds: . apixaban  5 mg Oral BID  . atorvastatin  10 mg Oral q1800  . calcium-vitamin D  1 tablet Oral TID  . diltiazem  360 mg Oral Daily  . folic acid  1 mg Oral BID  . insulin aspart  0-15 Units Subcutaneous TID WC  . insulin aspart  0-5 Units Subcutaneous QHS  . levothyroxine  50 mcg Oral QAC  breakfast  . metoprolol tartrate  75 mg Oral BID  . pantoprazole  40 mg Oral Daily  . vitamin B-12  500 mcg Oral Daily   Continuous Infusions: . albumin human 25 g (01/20/20 1019)  . azithromycin 500 mg (01/19/20 2218)  . cefTRIAXone (ROCEPHIN)  IV 2 g (01/19/20 2142)   PRN Meds:.acetaminophen, guaiFENesin-dextromethorphan, ondansetron **OR** ondansetron (ZOFRAN) IV  Antimicrobials: Anti-infectives (From admission, onward)   Start     Dose/Rate Route Frequency Ordered Stop   01/17/20 2200  cefTRIAXone (ROCEPHIN) 2 g in sodium chloride 0.9 % 100 mL IVPB     2 g 200 mL/hr over 30 Minutes Intravenous Every 24 hours 01/17/20 0251 01/22/20 2159   01/17/20 2200  azithromycin (ZITHROMAX) 500 mg in sodium chloride 0.9 % 250 mL IVPB     500 mg 250 mL/hr over 60 Minutes Intravenous Every 24 hours 01/17/20 0251 01/22/20 2159   01/17/20 0030  cefTRIAXone (ROCEPHIN) 1 g in sodium chloride 0.9 % 100 mL IVPB     1 g 200 mL/hr over 30 Minutes Intravenous  Once 01/17/20 0016 01/17/20  0223   01/17/20 0030  azithromycin (ZITHROMAX) 500 mg in sodium chloride 0.9 % 250 mL IVPB     500 mg 250 mL/hr over 60 Minutes Intravenous  Once 01/17/20 0016 01/17/20 0323       I have personally reviewed the following labs and images: CBC: Recent Labs  Lab 01/16/20 2344 01/17/20 0707 01/19/20 0623 01/19/20 1527 01/20/20 0435  WBC 9.3 6.2 10.3  --  9.6  NEUTROABS 6.3 5.3  --   --   --   HGB 10.5* 10.1* 8.5* 8.5* 9.0*  HCT 33.9* 32.2* 27.2* 27.0* 28.3*  MCV 96.6 95.5 95.1  --  95.3  PLT 263 265 224  --  197   BMP &GFR Recent Labs  Lab 01/17/20 0707 01/18/20 0342 01/18/20 1244 01/19/20 0623 01/20/20 0435  NA 138 135 137 138 138  K 4.8 4.2 3.5 3.6 3.7  CL 97* 99 99 99 100  CO2 26 25 27 27 24   GLUCOSE 166* 313* 265* 194* 172*  BUN 21 34* 35* 36* 25*  CREATININE 1.62* 2.19* 2.08* 1.66* 1.03*  CALCIUM 7.0* 6.3* 6.4* 6.6* 6.9*  MG  --  0.6* 1.4* 1.2* 1.7  PHOS  --   --  3.5 1.8* 2.6   Estimated Creatinine Clearance: 43.4 mL/min (A) (by C-G formula based on SCr of 1.03 mg/dL (H)). Liver & Pancreas: Recent Labs  Lab 01/16/20 2344 01/17/20 0707 01/18/20 1244 01/19/20 0623 01/20/20 0435  AST 19 29  --   --   --   ALT 15 14  --   --   --   ALKPHOS 88 85  --   --   --   BILITOT 0.6 1.3*  --   --   --   PROT 6.0* 5.9*  --   --   --   ALBUMIN 1.7* 1.6* 1.6* 2.3* 2.8*   No results for input(s): LIPASE, AMYLASE in the last 168 hours. No results for input(s): AMMONIA in the last 168 hours. Diabetic: Recent Labs    01/19/20 0840  HGBA1C 8.4*   Recent Labs  Lab 01/19/20 1117 01/19/20 1643 01/19/20 2153 01/20/20 0805 01/20/20 1237  GLUCAP 137* 193* 165* 130* 155*   Cardiac Enzymes: No results for input(s): CKTOTAL, CKMB, CKMBINDEX, TROPONINI in the last 168 hours. No results for input(s): PROBNP in the last 8760 hours. Coagulation Profile: No results for  input(s): INR, PROTIME in the last 168 hours. Thyroid Function Tests: Recent Labs    01/18/20 1240    FREET4 1.14*   Lipid Profile: No results for input(s): CHOL, HDL, LDLCALC, TRIG, CHOLHDL, LDLDIRECT in the last 72 hours. Anemia Panel: Recent Labs    01/19/20 1527  VITAMINB12 604  FOLATE 36.8  FERRITIN 686*  TIBC 155*  IRON 48  RETICCTPCT 3.2*   Urine analysis:    Component Value Date/Time   COLORURINE YELLOW 01/18/2020 1715   APPEARANCEUR HAZY (A) 01/18/2020 1715   LABSPEC 1.013 01/18/2020 1715   PHURINE 5.0 01/18/2020 1715   GLUCOSEU NEGATIVE 01/18/2020 1715   HGBUR NEGATIVE 01/18/2020 [place] 01/18/2020 1715   [place] 01/18/2020 [place] 01/18/2020 1715   UROBILINOGEN 1.0 03/16/2014 0628   NITRITE NEGATIVE 01/18/2020 1715   LEUKOCYTESUR LARGE (A) 01/18/2020 1715   Sepsis Labs: Invalid input(s): PROCALCITONIN, [place]  Microbiology: Recent Results (from the past 240 hour(s))  SARS Coronavirus 2 by RT PCR (hospital order, performed in [facility] hospital lab) Nasopharyngeal Nasopharyngeal Swab     Status: None   Collection Time: 01/17/20  5:24 PM   Specimen: Nasopharyngeal Swab  Result Value Ref Range Status   SARS Coronavirus 2 NEGATIVE NEGATIVE Final    Comment: (NOTE) SARS-CoV-2 target nucleic acids are NOT DETECTED. The SARS-CoV-2 RNA is generally detectable in upper and lower respiratory specimens during the acute phase of infection. The lowest concentration of SARS-CoV-2 viral copies this assay can detect is 250 copies / mL. A negative result does not preclude SARS-CoV-2 infection and should not be used as the sole basis for treatment or other patient management decisions.  A negative result may occur with improper specimen collection / handling, submission of specimen other than nasopharyngeal swab, presence of viral mutation(s) within the areas targeted by this assay, and inadequate number of viral copies (<250 copies / mL). A negative result must be combined with clinical observations, patient  history, and epidemiological information. Fact Sheet for Patients:   StrictlyIdeas.no Fact Sheet for Healthcare Providers: BankingDealers.co.za This test is not yet approved or cleared  by the [place] FDA and has been authorized for detection and/or diagnosis of SARS-CoV-2 by FDA under an Emergency Use Authorization (EUA).  This EUA will remain in effect (meaning this test can be used) for the duration of the COVID-19 declaration under Section 564(b)(1) of the Act, 21 U.S.C. section 360bbb-3(b)(1), unless the authorization is terminated or revoked sooner. Performed at [facility], [place] [address], [place], [place] [address]   Gram stain     Status: None   Collection Time: 01/18/20 11:15 AM   Specimen: Lung, Left; Pleural Fluid  Result Value Ref Range Status   Specimen Description PLEURAL FLUID  Final   Special Requests LEFT LUNG  Final   Gram Stain   Final    WBC PRESENT,BOTH PMN AND MONONUCLEAR NO ORGANISMS SEEN CYTOSPIN SMEAR Performed at [facility], [address], [place], [place] [address]    Report Status 01/19/2020 FINAL  Final  Acid Fast Smear (AFB)     Status: None   Collection Time: 01/18/20 11:15 AM   Specimen: Lung, Left; Pleural Fluid  Result Value Ref Range Status   AFB Specimen Processing Concentration  Final   Acid Fast Smear Negative  Final    Comment: (NOTE) Performed At: [facility] [address] [place], [place] [id number] Rush Farmer MD [id number]  Source (AFB) PLEURAL  Final    Comment: FLUID LEFT LUNG   Culture, body fluid-bottle     Status: None (Preliminary result)   Collection Time: 01/18/20 11:15 AM   Specimen: Pleura  Result Value Ref Range Status   Specimen Description PLEURAL FLUID  Final   Special Requests LEFT LUNG  Final   Culture   Final    NO GROWTH 2 DAYS Performed at [facility], [address], [place], [place] [address]    Report Status  PENDING  Incomplete    Radiology Studies: No results found.   Desmon Hitchner T. [place]  If 7PM-7AM, please contact night-coverage [email or web address] Password [facility] 01/20/2020, 12:57 PM

## 2020-01-20 NOTE — Progress Notes (Signed)
Admit: 01/16/2020 LOS: 74  74 year old female with tachybradycardia syndrome status post pacemaker, atrial fibrillation, diabetes type 2, recent Covid pneumonia who presents with extensive volume overload, hypoalbuminemia, and AKI  Subjective:  . Patient feels about the same today.  Happy to hear that her kidneys are working better.  Feels that she is urinating very well.  05/29 0701 - 05/30 0700 In: 960 [P.O.:960] Out: -   Filed Weights   01/18/20 0500 01/19/20 0454 01/20/20 0452  Weight: 83.6 kg 84.1 kg 84.4 kg    Scheduled Meds: . apixaban  5 mg Oral BID  . atorvastatin  10 mg Oral q1800  . calcium-vitamin D  1 tablet Oral TID  . diltiazem  360 mg Oral Daily  . folic acid  1 mg Oral BID  . insulin aspart  0-15 Units Subcutaneous TID WC  . insulin aspart  0-5 Units Subcutaneous QHS  . levothyroxine  50 mcg Oral QAC breakfast  . metoprolol tartrate  75 mg Oral BID  . pantoprazole  40 mg Oral Daily  . rOPINIRole  0.25 mg Oral QHS  . vitamin B-12  500 mcg Oral Daily   Continuous Infusions: . albumin human 25 g (01/20/20 1019)  . azithromycin 500 mg (01/19/20 2218)  . cefTRIAXone (ROCEPHIN)  IV 2 g (01/19/20 2142)   PRN Meds:.acetaminophen, guaiFENesin-dextromethorphan, ondansetron **OR** ondansetron (ZOFRAN) IV  Current Labs: reviewed    Physical Exam:  Blood pressure 127/84, pulse (!) 117, temperature 97.7 F (36.5 C), temperature source Oral, resp. rate 18, height 4\' 10"  (1.473 m), weight 84.4 kg, SpO2 94 %. GEN: Sitting in bed, no distress ENT: no nasal discharge, mmm EYES: no scleral icterus, eomi CV: normal rate, no audible murmur PULM: no iwob, bilateral chest rise ABD: NABS, non-distended SKIN: no rashes or jaundice EXT: 2+ pitting edema in bilateral lower extremities, warm and well perfused  A 74 year old female with tachybradycardia syndrome status post pacemaker, atrial fibrillation, diabetes type 2, recent Covid pneumonia who presents with extensive volume  overload, hypoalbuminemia, and AKI  P 1. AKI on CKD: Mild elevation in creatinine in the past but mostly acute kidney injury.  Rising creatinine with diuresis and significantly low albumin.  No signs of urinary protein loss.  Creatinine continues to improve with albumin yesterday.  Will hold albumin today and continue diuresis and monitor.  If creatinine is stable tomorrow likely can sign off.   1. Stop albumin, got 1 dose this morning 2. Furosemide 40 mg IV given today. 3. Agree with holding ARB 4. Continue to monitor intake and output as well as daily weights 5. Renal function panel daily 6. Encourage p.o. intake 1. Hypoalbuminemia: Marked decreased albumin of 1.6.  No improved with supplementation.  Most likely a negative acute phase reactant.  No signs of nephrotic syndrome.  Holding albumin as above. 2. Possible pneumonia: Infiltrate on x-ray but was there previously.  Treating with CAP coverage empirically per primary team 3. Bilateral pleural effusions: Status post thoracentesis with improvement of her symptoms.    Studies consistent with transudative process 4. Atrial fibrillation: With some RVR.  On rate controlling medications and IV heparin.  Cardiology following 5. Hypoxic respiratory failure: Mostly chronic in nature mainly associated with Covid.  Weaning oxygen as able and diuresing  6. Type 2 diabetes uncontrolled with hyperglycemia: Recent A1c around 9%.  Only on oral medications.  Needs to optimize control as able 7. Rheumatoid arthritis: On home medication with no signs of flare 8. Anemia of chronic disease:  Minimal contribution from kidney disease.    Iron replete.  No indication for ESA at this time. 9. Hypomagnesemia: Requiring repetitive IV magnesium supplementation.  Will consider stopping Protonix if this continues to be an issue. 10. Hypothyroidism: On home Synthroid . Medication Issues; o Preferred narcotic agents for pain control are hydromorphone, fentanyl, and  methadone. Morphine should not be used.  o Baclofen should be avoided o Avoid oral sodium phosphate and magnesium citrate based laxatives / bowel preps    Santiago Bumpers, MD 01/20/2020, 3:28 PM  Recent Labs  Lab 01/18/20 1244 01/19/20 0623 01/20/20 0435  NA 137 138 138  K 3.5 3.6 3.7  CL 99 99 100  CO2 27 27 24   GLUCOSE 265* 194* 172*  BUN 35* 36* 25*  CREATININE 2.08* 1.66* 1.03*  CALCIUM 6.4* 6.6* 6.9*  PHOS 3.5 1.8* 2.6   Recent Labs  Lab 01/16/20 2344 01/16/20 2344 01/17/20 0707 01/17/20 0707 01/19/20 0623 01/19/20 1527 01/20/20 0435  WBC 9.3   < > 6.2  --  10.3  --  9.6  NEUTROABS 6.3  --  5.3  --   --   --   --   HGB 10.5*   < > 10.1*   < > 8.5* 8.5* 9.0*  HCT 33.9*   < > 32.2*   < > 27.2* 27.0* 28.3*  MCV 96.6   < > 95.5  --  95.1  --  95.3  PLT 263   < > 265  --  224  --  197   < > = values in this interval not displayed.    Plan communicated to the primary team

## 2020-01-20 NOTE — Progress Notes (Signed)
Progress Note  Patient Name: [name] Date of Encounter: 01/20/2020  Primary Cardiologist: Quay Burow, MD   Subjective   Denies CP; mild dyspnea  Inpatient Medications    Scheduled Meds: . atorvastatin  10 mg Oral q1800  . calcium-vitamin D  1 tablet Oral TID  . diltiazem  360 mg Oral Daily  . folic acid  1 mg Oral BID  . insulin aspart  0-15 Units Subcutaneous TID WC  . insulin aspart  0-5 Units Subcutaneous QHS  . levothyroxine  50 mcg Oral QAC breakfast  . metoprolol tartrate  50 mg Oral BID  . pantoprazole  40 mg Oral Daily  . vitamin B-12  500 mcg Oral Daily   Continuous Infusions: . albumin human 25 g (01/19/20 2333)  . azithromycin 500 mg (01/19/20 2218)  . cefTRIAXone (ROCEPHIN)  IV 2 g (01/19/20 2142)  . heparin 1,100 Units/hr (01/19/20 2128)   PRN Meds: acetaminophen, guaiFENesin-dextromethorphan, ondansetron **OR** ondansetron (ZOFRAN) IV   Vital Signs    Vitals:   01/19/20 1646 01/19/20 2130 01/20/20 0020 01/20/20 0452  BP: 130/71 118/86 99/67 127/75  Pulse: 92 (!) 103 (!) 102 (!) 116  Resp: 18 16 18 19   Temp: 97.6 F (36.4 C) 98.3 F (36.8 C) 98.4 F (36.9 C) 98.5 F (36.9 C)  TempSrc: Oral Oral Oral Oral  SpO2: 99% 99% 99% 90%  Weight:    84.4 kg  Height:        Intake/Output Summary (Last 24 hours) at 01/20/2020 0746 Last data filed at 01/20/2020 [id number] Gross per 24 hour  Intake 960 ml  Output --  Net 960 ml   Last 3 Weights 01/20/2020 01/19/2020 01/18/2020  Weight (lbs) 186 lb 185 lb 6.5 oz 184 lb 6.4 oz  Weight (kg) 84.369 kg 84.1 kg 83.643 kg      Telemetry    Atrial fibrillation rate mildly elevated- Personally Reviewed   Physical Exam   GEN: No acute distress.  WD Neck: No JVD, supple Cardiac: irregular, no murmur Respiratory: diminished BS LLL; no wheeze  GI: Soft, NT/ND MS: 1+ edema Neuro:  Grossly intact Psych: Normal affect   Labs    High Sensitivity Troponin:   Recent Labs  Lab 01/17/20 0356  01/17/20 0707  TROPONINIHS 7 6      Chemistry Recent Labs  Lab 01/16/20 2344 01/16/20 2344 01/17/20 0707 01/18/20 0342 01/18/20 1244 01/19/20 0623 01/20/20 0435  NA 136   < > 138   < > 137 138 138  K 4.2   < > 4.8   < > 3.5 3.6 3.7  CL 100   < > 97*   < > 99 99 100  CO2 28   < > 26   < > 27 27 24   GLUCOSE 103*   < > 166*   < > 265* 194* 172*  BUN 21   < > 21   < > 35* 36* 25*  CREATININE 1.63*   < > 1.62*   < > 2.08* 1.66* 1.03*  CALCIUM 6.9*   < > 7.0*   < > 6.4* 6.6* 6.9*  PROT 6.0*  --  5.9*  --   --   --   --   ALBUMIN 1.7*   < > 1.6*  --  1.6* 2.3* 2.8*  AST 19  --  29  --   --   --   --   ALT 15  --  14  --   --   --   --  ALKPHOS 88  --  85  --   --   --   --   BILITOT 0.6  --  1.3*  --   --   --   --   GFRNONAA 31*   < > 31*   < > 23* 30* 53*  GFRAA 35*   < > 36*   < > 26* 35* >60  ANIONGAP 8   < > 15   < > 11 12 14    < > = values in this interval not displayed.     Hematology Recent Labs  Lab 01/17/20 0707 01/17/20 0707 01/19/20 0623 01/19/20 1527 01/20/20 0435  WBC 6.2  --  10.3  --  9.6  RBC 3.37*   < > 2.86* 2.96* 2.97*  HGB 10.1*   < > 8.5* 8.5* 9.0*  HCT 32.2*   < > 27.2* 27.0* 28.3*  MCV 95.5  --  95.1  --  95.3  MCH 30.0  --  29.7  --  30.3  MCHC 31.4  --  31.3  --  31.8  RDW 16.6*  --  16.7*  --  17.0*  PLT 265  --  224  --  197   < > = values in this interval not displayed.    BNP Recent Labs  Lab 01/16/20 2344  BNP 526.5*     Radiology    DG Chest 1 View  Result Date: 01/18/2020 CLINICAL DATA:  Status post left thoracentesis EXAM: CHEST  1 VIEW COMPARISON:  01/16/2020 FINDINGS: Cardiac shadow is stable. Pacing device is again seen. Bibasilar infiltrative changes are again seen. Reduction in left pleural effusion is noted following thoracentesis. No pneumothorax is seen. IMPRESSION: No evidence of pneumothorax following thoracentesis. Persistent bibasilar opacities are seen. Electronically Signed   By: Inez Catalina M.D.   On:  01/18/2020 11:58   US RENAL  Result Date: 01/18/2020 CLINICAL DATA:  Acute kidney injury. EXAM: RENAL / URINARY TRACT ULTRASOUND COMPLETE COMPARISON:  Abdominal CT 07/11/2018 FINDINGS: Right Kidney: Renal measurements: 9.5 x 5.3 x 5.4 cm = volume: 145 mL. Renal measurements do not include a large exophytic cyst from the upper pole of the right kidney. Upper pole cyst measures 8.0 x 5.2 x 6.4 cm. There is a second cyst in the mid kidney measuring 2.4 x 1.9 x 2.5 cm. Mild thinning of the renal parenchyma. Echogenicity within normal limits. No solid mass or hydronephrosis visualized. Trace perinephric fluid. Left Kidney: Renal measurements: 10.2 x 5.0 x 4.5 cm = volume: 120 mL. Mild thinning of the renal parenchyma. Echogenicity within normal limits. No mass or hydronephrosis visualized. Bladder: Appears normal for degree of bladder distention. Other: Small volume ascites. IMPRESSION: 1. No obstructive uropathy. 2. Mild bilateral renal parenchymal thinning. 3. Right renal cyst, including a large exophytic cyst from the upper pole measuring 8 cm. Electronically Signed   By: Keith Rake M.D.   On: 01/18/2020 19:46   ECHOCARDIOGRAM COMPLETE  Result Date: 01/18/2020    ECHOCARDIOGRAM REPORT   Patient Name:   [name] Date of Exam: 01/18/2020 Medical Rec #:  [id number]     Height:       58.0 in Accession #:    [id number]    Weight:       184.4 lb Date of Birth:  27-Nov-1945    BSA:          1.759 m Patient Age:    74 years      BP:  123/73 mmHg Patient Gender: F             HR:           110 bpm. Exam Location:  Inpatient Procedure: 2D Echo, Cardiac Doppler and Color Doppler Indications:    CHF  History:        Patient has prior history of Echocardiogram examinations, most                 recent 03/04/2019. Pacemaker, Arrythmias:Atrial Fibrillation,                 Signs/Symptoms:Shortness of Breath; Risk Factors:Hypertension,                 Diabetes and Dyslipidemia. LE edema, Covid, PNA.   Sonographer:    Dustin Flock Referring Phys: [place]  1. Left ventricular ejection fraction, by estimation, is 55 to 60%. The left ventricle has normal function. The left ventricle demonstrates regional wall motion abnormalities (see scoring diagram/findings for description). Paradoxical septal motion due to RV pacemaker. There is mild left ventricular hypertrophy. Left ventricular diastolic function could not be evaluated.  2. Right ventricular systolic function is normal. The right ventricular size is normal. There is normal pulmonary artery systolic pressure.  3. Large pleural effusion in the left lateral region.  4. The mitral valve is abnormal. Trivial mitral valve regurgitation.  5. The aortic valve is tricuspid. Aortic valve regurgitation is mild. Mild aortic valve sclerosis is present, with no evidence of aortic valve stenosis.  6. The inferior vena cava is normal in size with greater than 50% respiratory variability, suggesting right atrial pressure of 3 mmHg. Comparison(s): Changes from prior study are noted. 03/04/2019: LVEF 60-65%. FINDINGS  Left Ventricle: Left ventricular ejection fraction, by estimation, is 55 to 60%. The left ventricle has normal function. The left ventricle demonstrates regional wall motion abnormalities. The left ventricular internal cavity size was normal in size. There is mild left ventricular hypertrophy. Abnormal (paradoxical) septal motion, consistent with RV pacemaker. Left ventricular diastolic function could not be evaluated due to atrial fibrillation. Left ventricular diastolic function could not be evaluated. Right Ventricle: The right ventricular size is normal. No increase in right ventricular wall thickness. Right ventricular systolic function is normal. There is normal pulmonary artery systolic pressure. The tricuspid regurgitant velocity is 2.80 m/s, and  with an assumed right atrial pressure of 3 mmHg, the estimated right ventricular  systolic pressure is 99991111 mmHg. Left Atrium: Left atrial size was normal in size. Right Atrium: Right atrial size was normal in size. Pericardium: There is no evidence of pericardial effusion. Mitral Valve: The mitral valve is abnormal. There is mild thickening of the mitral valve leaflet(s). Trivial mitral valve regurgitation. Tricuspid Valve: The tricuspid valve is grossly normal. Tricuspid valve regurgitation is mild. Aortic Valve: The aortic valve is tricuspid. Aortic valve regurgitation is mild. Aortic regurgitation PHT measures 287 msec. Mild aortic valve sclerosis is present, with no evidence of aortic valve stenosis. Pulmonic Valve: The pulmonic valve was normal in structure. Pulmonic valve regurgitation is not visualized. Aorta: The aortic root and ascending aorta are structurally normal, with no evidence of dilitation. Venous: The inferior vena cava is normal in size with greater than 50% respiratory variability, suggesting right atrial pressure of 3 mmHg. IAS/Shunts: No atrial level shunt detected by color flow Doppler. Additional Comments: A pacer wire is visualized. There is a large pleural effusion in the left lateral region.  LEFT VENTRICLE PLAX 2D LVIDd:  3.30 cm  Diastology LVIDs:         2.60 cm  LV e' lateral:   6.42 cm/s LV PW:         1.10 cm  LV E/e' lateral: 12.9 LV IVS:        1.10 cm  LV e' medial:    8.81 cm/s LVOT diam:     2.00 cm  LV E/e' medial:  9.4 LV SV:         51 LV SV Index:   29 LVOT Area:     3.14 cm  RIGHT VENTRICLE RV Basal diam:  2.20 cm RV S prime:     13.80 cm/s TAPSE (M-mode): 2.5 cm LEFT ATRIUM           Index       RIGHT ATRIUM           Index LA diam:      4.10 cm 2.33 cm/m  RA Area:     16.90 cm LA Vol (A4C): 53.1 ml 30.18 ml/m RA Volume:   43.40 ml  24.67 ml/m  AORTIC VALVE LVOT Vmax:   79.60 cm/s LVOT Vmean:  64.200 cm/s LVOT VTI:    0.163 m AI PHT:      287 msec  AORTA Ao Root diam: 2.30 cm MITRAL VALVE               TRICUSPID VALVE MV Area (PHT): 3.99  cm    TR Peak grad:   31.4 mmHg MV Decel Time: 190 msec    TR Vmax:        280.00 cm/s MV E velocity: 83.10 cm/s MV A velocity: 42.40 cm/s  SHUNTS MV E/A ratio:  1.96        Systemic VTI:  0.16 m                            Systemic Diam: 2.00 cm Lyman Bishop MD Electronically signed by Lyman Bishop MD Signature Date/Time: 01/18/2020/10:28:37 AM    Final    IR THORACENTESIS ASP PLEURAL SPACE W/IMG GUIDE  Result Date: 01/18/2020 INDICATION: Patient history of congestive heart failure with shortness of breath presents for therapeutic and diagnostic thoracentesis EXAM: ULTRASOUND GUIDED THERAPEUTIC AND DIAGNOSTIC THORACENTESIS MEDICATIONS: Lidocaine 1% 10 mL COMPLICATIONS: None immediate. PROCEDURE: An ultrasound guided thoracentesis was thoroughly discussed with the patient and questions answered. The benefits, risks, alternatives and complications were also discussed. The patient understands and wishes to proceed with the procedure. Written consent was obtained. Ultrasound was performed to localize and mark an adequate pocket of fluid in the left-sided chest. The area was then prepped and draped in the normal sterile fashion. 1% Lidocaine was used for local anesthesia. Under ultrasound guidance a 6 Fr Safe-T-Centesis catheter was introduced. Thoracentesis was performed. The catheter was removed and a dressing applied. FINDINGS: A total of approximately 1 L of serosanguineous fluid was removed. Samples were sent to the laboratory as requested by the clinical team. IMPRESSION: Successful ultrasound guided left-sided therapeutic and diagnostic thoracentesis yielding 1 L of pleural fluid. Read by: Rushie Nyhan, NP Electronically Signed   By: Aletta Edouard M.D.   On: 01/18/2020 15:45    Patient Profile     74 y.o. 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  Echocardiogram this admission shows normal LV function and mild aortic insufficiency.  Assessment & Plan    1 Persistent atrial fibrillation-this occurred in the setting of pneumonia and recent hospitalization for Covid.  Heart rate remains mildly elevated.  Continue Cardizem at present dose.  Blood pressure tolerating metoprolol better.  Will increase to 75 mg twice daily.  Discontinue IV heparin and resume apixaban 5 mg twice daily.  As she improves will likely need TEE guided cardioversion.  2 acute diastolic congestive heart failure/volume excess-possible contribution from atrial fibrillation.  Also likely contribution from low oncotic pressure related to hypoalbuminemia.  Echocardiogram shows normal LV function.  We will give Lasix 40 mg IV x1 today.  3 acute on chronic stage III kidney disease-renal function improved today.  We will continue to follow.  4 hypertension-patient's blood pressure is controlled.  Medications changes as outlined above.  5 prior pacemaker  6 pleural effusion-status post thoracentesis.  Symptomatically improved.  Will await follow-up cultures.  7 pneumonia-continue antibiotics.  For questions or updates, please contact [place] Please consult [email or web address] for contact info under        Signed, Kirk Ruths, MD  01/20/2020, 7:46 AM

## 2020-01-21 ENCOUNTER — Inpatient Hospital Stay (HOSPITAL_COMMUNITY): Payer: PPO

## 2020-01-21 DIAGNOSIS — J9601 Acute respiratory failure with hypoxia: Secondary | ICD-10-CM

## 2020-01-21 LAB — RENAL FUNCTION PANEL
Albumin: 2.9 g/dL — ABNORMAL LOW (ref 3.5–5.0)
Anion gap: 11 (ref 5–15)
BUN: 18 mg/dL (ref 8–23)
CO2: 25 mmol/L (ref 22–32)
Calcium: 7.6 mg/dL — ABNORMAL LOW (ref 8.9–10.3)
Chloride: 103 mmol/L (ref 98–111)
Creatinine, Ser: 0.92 mg/dL (ref 0.44–1.00)
GFR calc Af Amer: >60 mL/min (ref >60)
GFR calc non Af Amer: >60 mL/min (ref >60)
Glucose, Bld: 157 mg/dL — ABNORMAL HIGH (ref 70–99)
Phosphorus: 1.9 mg/dL — ABNORMAL LOW (ref 2.5–4.6)
Potassium: 4 mmol/L (ref 3.5–5.1)
Sodium: 139 mmol/L (ref 135–145)

## 2020-01-21 LAB — MAGNESIUM: Magnesium: 1.8 mg/dL (ref 1.7–2.4)

## 2020-01-21 LAB — GLUCOSE, CAPILLARY
Glucose-Capillary: 161 mg/dL — ABNORMAL HIGH (ref 70–99)
Glucose-Capillary: 186 mg/dL — ABNORMAL HIGH (ref 70–99)
Glucose-Capillary: 225 mg/dL — ABNORMAL HIGH (ref 70–99)
Glucose-Capillary: 259 mg/dL — ABNORMAL HIGH (ref 70–99)

## 2020-01-21 LAB — SEDIMENTATION RATE: Sed Rate: 55 mm/hr — ABNORMAL HIGH (ref 0–22)

## 2020-01-21 LAB — HEMOGLOBIN AND HEMATOCRIT, BLOOD
HCT: 32.6 % — ABNORMAL LOW (ref 36.0–46.0)
Hemoglobin: 10.1 g/dL — ABNORMAL LOW (ref 12.0–15.0)

## 2020-01-21 MED ORDER — MAGNESIUM SULFATE IN D5W 1-5 GM/100ML-% IV SOLN
1.0000 g | Freq: Once | INTRAVENOUS | Status: AC
  Administered 2020-01-21: 1 g via INTRAVENOUS
  Filled 2020-01-21: qty 100

## 2020-01-21 MED ORDER — SODIUM PHOSPHATES 45 MMOLE/15ML IV SOLN
30.0000 mmol | Freq: Once | INTRAVENOUS | Status: AC
  Administered 2020-01-21: 30 mmol via INTRAVENOUS
  Filled 2020-01-21: qty 10

## 2020-01-21 MED ORDER — ACETAMINOPHEN 500 MG PO TABS
1000.0000 mg | ORAL_TABLET | Freq: Three times a day (TID) | ORAL | Status: DC | PRN
  Administered 2020-01-21 – 2020-01-26 (×8): 1000 mg via ORAL
  Filled 2020-01-21 (×8): qty 2

## 2020-01-21 MED ORDER — METOPROLOL TARTRATE 100 MG PO TABS
100.0000 mg | ORAL_TABLET | Freq: Two times a day (BID) | ORAL | Status: DC
  Administered 2020-01-21 – 2020-01-26 (×11): 100 mg via ORAL
  Filled 2020-01-21 (×11): qty 1

## 2020-01-21 MED ORDER — FUROSEMIDE 10 MG/ML IJ SOLN
20.0000 mg | Freq: Once | INTRAMUSCULAR | Status: AC
  Administered 2020-01-21: 20 mg via INTRAVENOUS
  Filled 2020-01-21: qty 2

## 2020-01-21 NOTE — Progress Notes (Signed)
[facility] NEPHROLOGY PROGRESS NOTE  Assessment/ Plan: Pt is a 74 y.o. yo female  with tachybradycardia syndrome status post pacemaker, atrial fibrillation, diabetes type 2, recent Covid pneumonia who presentswith extensive volume overload, hypoalbuminemia, and AKI.  #Acute kidney injury with fluid overload: Serum creatinine level improved.  UA with no proteinuria.  Kidney ultrasound with no obstructive uropathy.  Lower extremity edema has been managed with diuretics.  Received a dose of IV Lasix today.    #Hypoalbuminemia/possible malnutrition: No proteinuria noted.  Received IV albumin.  #Acute on chronic diastolic CHF: Cardiology is following and managing diuretics.  #Possible pneumonia: Completing ceftriaxone and azithromycin tomorrow.  #Bilateral pleural effusion more on the left side status post IR thoracocentesis on 5/28 with removal of 1 L of fluid which was exudative.  #History of COVID-19 infection.  The creatinine level has improved.  Nonoliguric.  Continue diuretics for lower extremity edema/CHF exacerbation.  Nothing further to add therefore I will sign off.  Please call back with question.  Subjective: Seen and examined at bedside.  Sitting on chair comfortable.  Denies nausea vomiting chest pain shortness of breath.  Urine output increasing with IV diuretics.  No new event.  Feels good. Objective Vital signs in last 24 hours: Vitals:   01/21/20 0554 01/21/20 0737 01/21/20 0853 01/21/20 1122  BP: (!) 122/96 (!) 142/96  129/74  Pulse: (!) 111 (!) 109  82  Resp: 17 17  17   Temp: 98.2 F (36.8 C) 98.3 F (36.8 C)  98.5 F (36.9 C)  TempSrc: Oral Oral  Oral  SpO2: 93% 95% 92% 95%  Weight: 84.1 kg     Height:       Weight change: -0.272 kg  Intake/Output Summary (Last 24 hours) at 01/21/2020 1502 Last data filed at 01/21/2020 1200 Gross per 24 hour  Intake 622 ml  Output 1025 ml  Net -403 ml       Labs: Basic Metabolic Panel: Recent Labs   Lab 01/19/20 0623 01/20/20 0435 01/21/20 0400  NA 138 138 139  K 3.6 3.7 4.0  CL 99 100 103  CO2 27 24 25   GLUCOSE 194* 172* 157*  BUN 36* 25* 18  CREATININE 1.66* 1.03* 0.92  CALCIUM 6.6* 6.9* 7.6*  PHOS 1.8* 2.6 1.9*   Liver Function Tests: Recent Labs  Lab 01/16/20 2344 01/16/20 2344 01/17/20 0707 01/18/20 1244 01/19/20 0623 01/20/20 0435 01/21/20 0400  AST 19  --  29  --   --   --   --   ALT 15  --  14  --   --   --   --   ALKPHOS 88  --  85  --   --   --   --   BILITOT 0.6  --  1.3*  --   --   --   --   PROT 6.0*  --  5.9*  --   --   --   --   ALBUMIN 1.7*   < > 1.6*   < > 2.3* 2.8* 2.9*   < > = values in this interval not displayed.   No results for input(s): LIPASE, AMYLASE in the last 168 hours. No results for input(s): AMMONIA in the last 168 hours. CBC: Recent Labs  Lab 01/16/20 2344 01/16/20 2344 01/17/20 0707 01/17/20 0707 01/19/20 0623 01/19/20 0623 01/19/20 1527 01/20/20 0435 01/21/20 0400  WBC 9.3   < > 6.2  --  10.3  --   --  9.6  --  NEUTROABS 6.3  --  5.3  --   --   --   --   --   --   HGB 10.5*   < > 10.1*   < > 8.5*   < > 8.5* 9.0* 10.1*  HCT 33.9*   < > 32.2*   < > 27.2*   < > 27.0* 28.3* 32.6*  MCV 96.6  --  95.5  --  95.1  --   --  95.3  --   PLT 263   < > 265  --  224  --   --  197  --    < > = values in this interval not displayed.   Cardiac Enzymes: No results for input(s): CKTOTAL, CKMB, CKMBINDEX, TROPONINI in the last 168 hours. CBG: Recent Labs  Lab 01/20/20 1237 01/20/20 1616 01/20/20 2123 01/21/20 0740 01/21/20 1120  GLUCAP 155* 198* 132* 161* 186*    Iron Studies:  Recent Labs    01/19/20 1527  IRON 48  TIBC 155*  FERRITIN 686*   Studies/Results: No results found.  Medications: Infusions: . azithromycin 500 mg (01/20/20 2215)  . cefTRIAXone (ROCEPHIN)  IV 2 g (01/20/20 2122)  . magnesium sulfate bolus IVPB    . sodium phosphate  Dextrose 5% IVPB      Scheduled Medications: . apixaban  5 mg Oral  BID  . atorvastatin  10 mg Oral q1800  . calcium-vitamin D  1 tablet Oral TID  . diltiazem  360 mg Oral Daily  . folic acid  1 mg Oral BID  . insulin aspart  0-15 Units Subcutaneous TID WC  . insulin aspart  0-5 Units Subcutaneous QHS  . levothyroxine  50 mcg Oral QAC breakfast  . metoprolol tartrate  100 mg Oral BID  . pantoprazole  40 mg Oral Daily  . rOPINIRole  0.25 mg Oral QHS  . vitamin B-12  500 mcg Oral Daily    have reviewed scheduled and prn medications.  Physical Exam: General:NAD, comfortable Heart:RRR, s1s2 nl Lungs:clear b/l, no crackle Abdomen:soft, Non-tender, non-distended Extremities: LE edema+ Neurology: Alert awake, no asterixis  Ancelmo Hunt Tanna Furry 01/21/2020,3:02 PM  LOS: 4 days  Pager: ID:[phone]

## 2020-01-21 NOTE — Progress Notes (Signed)
Occupational Therapy Evaluation Patient Details Name: [name] MRN: [id number] DOB: 09-23-1945 Today's Date: 01/21/2020    History of Present Illness Pt is a 74 y/o female admitted secondary to SOB. Pt found to have acute respiratory failure with hypoxia likely a combination of CHF and Pneumonia. Of note, pt with recent +COVID one month ago. PMH including but not limited to a-fib, DM, HTN and RA.   Clinical Impression   Prior to hospitalization, pt was independent with most BADLs- requiring min assist with transferring in/out tub/shower combo and washing hair. Pt was Mod I to ambulate throughout her living environment using rollator for support. Pt lives with her daughter/grand children in a 2-story house with ramp access and first floor living. Pt admitted for above and limited by decreased activity tolerance, decreased balance, and decreased safety awareness. Pt received sitting in recliner, agreeable to OT evaluation. Assessed functional transfers, functional mobility, balance, toileting, and grooming. Pt requires supervision-min guard for functional mobility using RW, min assist for toilet transfer using grab bar, and supervision for standing at sink to wash hands independently. Pt would benefit from continued skilled OT services at home to address ADLs/IADLs and functional mobility for safety. Recommending HHOT and BSC to be placed over toilet seat at home. Will continue to follow pt acutely as able.    Follow Up Recommendations  Home health OT;Supervision/Assistance - 24 hour    Equipment Recommendations  3 in 1 bedside commode    Recommendations for Other Services       Precautions / Restrictions Precautions Precautions: Fall Precaution Comments: monitor SpO2 Restrictions Weight Bearing Restrictions: No      Mobility Bed Mobility Overal bed mobility: (received in recliner) Bed Mobility: (received in recliner)           General bed mobility comments: received in  recliner; Mod I to scoot forward before standing  Transfers Overall transfer level: Needs assistance Equipment used: None Transfers: Sit to/from Stand Sit to Stand: Supervision         General transfer comment: wall walking noted; will encourage pt to utilize RW next time since pt typically uses rollator at home; 1 LOB noted; pt corrected herself    Balance Overall balance assessment: Needs assistance Sitting-balance support: Feet supported Sitting balance-Leahy Scale: Good     Standing balance support: Single extremity supported;During functional activity Standing balance-Leahy Scale: Fair                             ADL either performed or assessed with clinical judgement   ADL Overall ADL's : Needs assistance/impaired Eating/Feeding: Set up;Sitting   Grooming: Wash/dry hands;Standing;Min guard   Upper Body Bathing: Sitting;Set up   Lower Body Bathing: Min guard;Sitting/lateral leans;Sit to/from stand   Upper Body Dressing : Set up;Sitting   Lower Body Dressing: Minimal assistance;Sitting/lateral leans;Sit to/from stand   Toilet Transfer: Minimal assistance;Ambulation;Regular Toilet;Grab bars   Toileting- Clothing Manipulation and Hygiene: Supervision/safety;Sitting/lateral lean;Sit to/from stand   Tub/ Shower Transfer: Min guard;Ambulation;Shower seat;Grab bars   Functional mobility during ADLs: Supervision/safety General ADL Comments: wall walking at times noted; discussed importance of utilzing sturdy surfaces for support such as rollator; requires supervision and min guard at times for balance and safety     Vision         Perception     Praxis      Pertinent Vitals/Pain Pain Assessment: 0-10 Pain Score: 3  Pain Location: headache Pain Descriptors / Indicators:  Aching;Discomfort;Dull Pain Intervention(s): Monitored during session;Repositioned     Hand Dominance Right   Extremity/Trunk Assessment Upper Extremity Assessment Upper  Extremity Assessment: Overall WFL for tasks assessed   Lower Extremity Assessment Lower Extremity Assessment: Defer to PT evaluation   Cervical / Trunk Assessment Cervical / Trunk Assessment: Kyphotic   Communication Communication Communication: No difficulties   Cognition Arousal/Alertness: Awake/alert Behavior During Therapy: WFL for tasks assessed/performed Overall Cognitive Status: Within Functional Limits for tasks assessed                                 General Comments: slightly decreased safety awareness    General Comments  severe edema/swelling in BLEs, especially feet, nursing and physician aware    Exercises     Shoulder Instructions      Home Living Family/patient expects to be discharged to:: Private residence Living Arrangements: Children Available Help at Discharge: Family Type of Home: House Home Access: Ramped entrance     Home Layout: Two level;Able to live on main level with bedroom/bathroom Alternate Level Stairs-Number of Steps: can stay on main   Bathroom Shower/Tub: Tub/shower unit   [employer]: Standard     Home Equipment: Grab bars - tub/shower;Walker - 4 wheels;Shower seat          Prior Functioning/Environment Level of Independence: Independent with assistive device(s)        Comments: ambulates with rollator        OT Problem List: Decreased activity tolerance;Impaired balance (sitting and/or standing);Decreased safety awareness;Obesity;Pain      OT Treatment/Interventions: Self-care/ADL training;Therapeutic exercise;Energy conservation;DME and/or AE instruction;Therapeutic activities    OT Goals(Current goals can be found in the care plan section) Acute Rehab OT Goals Patient Stated Goal: "to go home" OT Goal Formulation: With patient  OT Frequency: Min 2X/week   Barriers to D/C:            Co-evaluation              AM-PAC OT "6 Clicks" Daily Activity     Outcome Measure Help from  another person eating meals?: None Help from another person taking care of personal grooming?: None Help from another person toileting, which includes using toliet, bedpan, or urinal?: A Little Help from another person bathing (including washing, rinsing, drying)?: A Little Help from another person to put on and taking off regular upper body clothing?: None Help from another person to put on and taking off regular lower body clothing?: A Little 6 Click Score: 21   End of Session Equipment Utilized During Treatment: Gait belt;Oxygen Nurse Communication: Mobility status  Activity Tolerance: Patient tolerated treatment well;Patient limited by fatigue(pt reports feeling "winded") Patient left: in chair;with call bell/phone within reach  OT Visit Diagnosis: Unsteadiness on feet (R26.81);Muscle weakness (generalized) (M62.81);Pain Pain - part of body: (headache)                Time: 1326-1350 OT Time Calculation (min): 24 min Charges:  OT General Charges $OT Visit: 1 Visit OT Evaluation $OT Eval Moderate Complexity: 1 Mod  Michel Bickers, OTR/L [facility] [phone]   Francesca Jewett 01/21/2020, 2:24 PM

## 2020-01-21 NOTE — Progress Notes (Signed)
Physical Therapy Treatment Patient Details Name: [name] MRN: [id number] DOB: 1945-09-29 Today's Date: 01/21/2020    History of Present Illness Pt is a 74 y/o female admitted secondary to SOB. Pt found to have acute respiratory failure with hypoxia likely a combination of CHF and Pneumonia. Of note, pt with recent +COVID one month ago. PMH including but not limited to a-fib, DM, HTN and RA.    PT Comments    Pt is up in recliner on entry, agreeable to walk with therapy as she wants to see if she can progress her distance today. Pt reports feeling much better today, however continues to have a headache, and she states she doesn't usually get headaches so it is concerning. Pt requesting pain medication and RN notified. Pt continues to be limited in safe mobility by slight oxygen desaturation (see General Comments) in presence of decreased strength and endurance. Pt requires min guard for transfers and ambulation of 120 feet with RW. Pt is very pleased with her progress. D/c plans remain appropriate at this time. PT will continue to follow acutely.     Follow Up Recommendations  Home health PT;Supervision/Assistance - 24 hour     Equipment Recommendations  None recommended by PT       Precautions / Restrictions Precautions Precautions: Fall Precaution Comments: monitor SpO2 Restrictions Weight Bearing Restrictions: No    Mobility  Bed Mobility Overal bed mobility: (received in recliner) Bed Mobility: (received in recliner)           General bed mobility comments: OOB in recliner on entry   Transfers Overall transfer level: Needs assistance Equipment used: Rolling walker (2 wheeled) Transfers: Sit to/from Stand Sit to Stand: Supervision         General transfer comment: good power up and steadying before reaching for RW  Ambulation/Gait Ambulation/Gait assistance: Min guard Gait Distance (Feet): 120 Feet Assistive device: Rolling walker (2 wheeled) Gait  Pattern/deviations: Step-through pattern;Decreased stride length Gait velocity: decreased Gait velocity interpretation: 1.31 - 2.62 ft/sec, indicative of limited community ambulator General Gait Details: min guard for safety with slow, steady gait, requires 2x short rest break for 4/4 DoE        Balance Overall balance assessment: Needs assistance Sitting-balance support: Feet supported Sitting balance-Leahy Scale: Good     Standing balance support: Bilateral upper extremity supported Standing balance-Leahy Scale: Poor                              Cognition Arousal/Alertness: Awake/alert Behavior During Therapy: WFL for tasks assessed/performed Overall Cognitive Status: Within Functional Limits for tasks assessed                                 General Comments: slightly decreased safety awareness          General Comments General comments (skin integrity, edema, etc.): Pt ambulated on 2L O2 via [place] and was able to maintain SaO2 >90%O2 until the last 10 feet of ambulation when SaO2 dropped to 88%O2, with sitting SaO2 rebounded very quickl to 92%O2. Pt continues to have edema in B LE, elevated LE after ambulation       Pertinent Vitals/Pain Pain Assessment: 0-10 Pain Score: 3  Pain Location: headache Pain Descriptors / Indicators: Aching;Discomfort Pain Intervention(s): Monitored during session;Repositioned    Home Living Family/patient expects to be discharged to:: Private residence Living Arrangements: Children Available  Help at Discharge: Family Type of Home: House Home Access: Ramped entrance   Home Layout: Two level;Able to live on main level with bedroom/bathroom Home Equipment: Grab bars - tub/shower;Walker - 4 wheels;Shower seat      Prior Function Level of Independence: Independent with assistive device(s)      Comments: ambulates with rollator   PT Goals (current goals can now be found in the care plan section) Acute Rehab PT  Goals Patient Stated Goal: "to go home" PT Goal Formulation: With patient Time For Goal Achievement: 02/02/20 Potential to Achieve Goals: Good Progress towards PT goals: Progressing toward goals    Frequency    Min 3X/week      PT Plan Current plan remains appropriate    Co-evaluation              AM-PAC PT "6 Clicks" Mobility   Outcome Measure  Help needed turning from your back to your side while in a flat bed without using bedrails?: None Help needed moving from lying on your back to sitting on the side of a flat bed without using bedrails?: None Help needed moving to and from a bed to a chair (including a wheelchair)?: None Help needed standing up from a chair using your arms (e.g., wheelchair or bedside chair)?: None Help needed to walk in hospital room?: A Little Help needed climbing 3-5 steps with a railing? : A Lot 6 Click Score: 21    End of Session Equipment Utilized During Treatment: Oxygen Activity Tolerance: Patient limited by fatigue Patient left: in chair;with call bell/phone within reach Nurse Communication: Mobility status PT Visit Diagnosis: Other abnormalities of gait and mobility (R26.89);Muscle weakness (generalized) (M62.81)     Time: 1539-1600 PT Time Calculation (min) (ACUTE ONLY): 21 min  Charges:  $Gait Training: 8-22 mins                     Zhoey Blackstock B. Migdalia Dk PT, DPT Acute Rehabilitation Services Pager [phone] Office [phone]    [place] 01/21/2020, 4:18 PM

## 2020-01-21 NOTE — Progress Notes (Signed)
PROGRESS NOTE  [name] [id number] DOB: Feb 19, 1946   PCP: Alycia Rossetti, MD  Patient is from: Home. Lives with daughter and grandchildren. Uses walker since COVID-19 infection.  DOA: 01/16/2020 LOS: 4  Brief Narrative / Interim history: 74 year old female with history of SSS/PPM, A. fib, DM-2, respiratory failure on 2 L after COVID-19 infection about a month ago. She presented with progressive shortness of breath for 1 week despite increased dose of Lasix by her cardiologist and Levaquin by PCP. He also had associated chest pressure.  In ED, HR in 120s. 95% on 3 L. Signs of fluid overload with JVD and BLE edema. CXR consistent with CHF, moderate left pleural effusion and possible right lung infiltrate. BNP elevated to 526. Cr 1.6. EKG A. fib with mild RVR. Received IV Lasix, empiric antibiotics for pneumonia and admitted for acute on chronic CHF, possible pneumonia and A. fib. Cardiology consulted.  Started on low-dose IV Lasix.  Renal function worse.  5/28-nephrology consulted.  Lasix discontinued.  Started on albumin.  Thoracocentesis with removal of 1 L transudative and culture negative fluid.    Renal function improved significantly.  Restarted on IV Lasix by cardiology.  Subjective: Seen and examined earlier this morning.  No major events overnight of this morning.  Continues to endorse right-sided headache.  She describes the headache as sharp.  Felt some nausea but no emesis.  Denies vision change, focal weakness, numbness or tingling.  Denies chest pain or dyspnea.  Objective: Vitals:   01/21/20 0554 01/21/20 0737 01/21/20 0853 01/21/20 1122  BP: (!) 122/96 (!) 142/96  129/74  Pulse: (!) 111 (!) 109  82  Resp: 17 17  17   Temp: 98.2 F (36.8 C) 98.3 F (36.8 C)  98.5 F (36.9 C)  TempSrc: Oral Oral  Oral  SpO2: 93% 95% 92% 95%  Weight: 84.1 kg     Height:        Intake/Output Summary (Last 24 hours) at 01/21/2020 1435 Last data filed at 01/21/2020 1200 Gross  per 24 hour  Intake 622 ml  Output 1025 ml  Net -403 ml   Filed Weights   01/19/20 0454 01/20/20 0452 01/21/20 0554  Weight: 84.1 kg 84.4 kg 84.1 kg    Examination:  GENERAL: No apparent distress.  Nontoxic. HEENT: MMM.  Vision and hearing grossly intact.  No right temporal tenderness. NECK: Supple.  No apparent JVD.  RESP: 96% on 2 L.  No IWOB.  Fair aeration bilaterally. CVS:  RRR. Heart sounds normal.  ABD/GI/GU: BS+. Abd soft, NTND.  MSK/EXT:  Moves extremities. No apparent deformity.  1+ pitting BLE edema SKIN: no apparent skin lesion or wound NEURO: Awake, alert and oriented appropriately.  No apparent focal neuro deficit. PSYCH: Calm. Normal affect.  Procedures:  -5/28-IR thoracocentesis with removal of 1 L transudative and culture negative fluid  Microbiology summarized: COVID-19 PCR negative. Pleural fluid culture NGTD. Pleural AFB and Fungal culture pending.   Assessment & Plan: Acute on chronic diastolic CHF: Echo with EF of 55 to 60%, RWMA, paradoxical septal motion due to RV pacemaker and large left pleural effusion.  She had 5 unmeasured voids.  Weight 1lb down from yesterday. -Cardiology and nephrology managing-IV Lasix 20 mg today -Monitor fluid status, renal function and electrolytes -Sodium and fluid restriction -She is anxious about TED hose due to arthritis. Recommended leg elevation.  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  BUN 34>>18. -Appreciate nephrology insight  and guidance -Continue monitoring  Possible pneumonia: Clinically low suspicion but some radiologic evidence of right lung infiltrate.  This was also present on his CXR on 5/18 and 5/24.  Pro-Cal negative but might not be reliable while on Arava.  She has no fever or leukocytosis.  CXR finding could be sequela of her recent COVID-19 infection. -Completes ceftriaxone and azithromycin empirically on 6/1. -Repeat chest x-ray in 3 to 4 weeks to  ensure resolution  Bilateral pleural effusion, moderate on the left and small on the right-likely due to CHF and hypoalbuminemia.  -IR thoracocentesis on 5/28 with removal of 1 L transudative and culture negative fluid. -Received albumin.  Restarted on Lasix.  Persistent A. fib with RVR: Still with RVR with heart rate 110-115.  Thought to be provoked by pneumonia -On Cardizem CD 360 mg daily.  Metoprolol increased from 75 to 100 mg twice daily -On Eliquis for anticoagulation. -TEE cardioversion?  She may not need this has HR is improving.  Chronic respiratory failure due to COVID-19 infection-was discharged on 2 L last month.  Repeat CXR with persistent bibasilar opacities. -Wean oxygen as able -Incentive spirometry  Uncontrolled DM-2 with hyperglycemia: A1c 8.4%.On Metformin 1000 mg twice daily Recent Labs    01/20/20 2123 01/21/20 0740 01/21/20 1120  GLUCAP 132* 161* 186*  -Continue CBG monitoring and SSI-moderate  History of tachybradycardia syndrome s/p PPM -Per cardiology  Recent COVID-19 infection: Tested positive on 4/12. -No isolation/precaution indicated  Rheumatoid arthritis: On Arava at home. -Arava on hold out of concern for pneumonia  Anemia of chronic disease: B/l Hgb 10-11> 10.5 (admit)> 8.5>9.0>10.  Anemia panel consistent with ACD.  Denies melena or hematochezia. -Continue monitoring  Hypothyroidism -Continue home Synthroid  Hypomagnesemia hypophosphatemia: Mg 1.8. P 1.9 - IV magnesium sulfate 2 g x 1 -Sodium phosphate 30 mmol x 1  Hypocalcemia-corrects to 8.4. -Per nephrology.  Debility: Has been using walker since discharge from hospital after COVID-19 hospitalization. -PT/OT  Restless leg syndrome -Continue Requip  Headache: No red flags. -Tylenol 1 g every 8 hours as needed, and tramadol 50 mg every 12 hours as needed            DVT prophylaxis: On heparin drip for A. fib Code Status: Full code Family Communication: Patient and/or  RN. Available if any question.  Status is: Inpatient  Remains inpatient appropriate because:Hemodynamically unstable, Ongoing diagnostic testing needed not appropriate for outpatient work up and IV treatments appropriate due to intensity of illness or inability to take PO   Dispo: The patient is from: Home              Anticipated d/c is to: Home              Anticipated d/c date is: 2 days              Patient currently is not medically stable to d/c.        Consultants:  Cardiology IR Nephrology   Sch Meds:  Scheduled Meds: . apixaban  5 mg Oral BID  . atorvastatin  10 mg Oral q1800  . calcium-vitamin D  1 tablet Oral TID  . diltiazem  360 mg Oral Daily  . folic acid  1 mg Oral BID  . insulin aspart  0-15 Units Subcutaneous TID WC  . insulin aspart  0-5 Units Subcutaneous QHS  . levothyroxine  50 mcg Oral QAC breakfast  . metoprolol tartrate  100 mg Oral BID  . pantoprazole  40 mg Oral  Daily  . rOPINIRole  0.25 mg Oral QHS  . vitamin B-12  500 mcg Oral Daily   Continuous Infusions: . azithromycin 500 mg (01/20/20 2215)  . cefTRIAXone (ROCEPHIN)  IV 2 g (01/20/20 2122)   PRN Meds:.acetaminophen, guaiFENesin-dextromethorphan, ondansetron **OR** ondansetron (ZOFRAN) IV, traMADol  Antimicrobials: Anti-infectives (From admission, onward)   Start     Dose/Rate Route Frequency Ordered Stop   01/17/20 2200  cefTRIAXone (ROCEPHIN) 2 g in sodium chloride 0.9 % 100 mL IVPB     2 g 200 mL/hr over 30 Minutes Intravenous Every 24 hours 01/17/20 0251 01/22/20 2159   01/17/20 2200  azithromycin (ZITHROMAX) 500 mg in sodium chloride 0.9 % 250 mL IVPB     500 mg 250 mL/hr over 60 Minutes Intravenous Every 24 hours 01/17/20 0251 01/22/20 2159   01/17/20 0030  cefTRIAXone (ROCEPHIN) 1 g in sodium chloride 0.9 % 100 mL IVPB     1 g 200 mL/hr over 30 Minutes Intravenous  Once 01/17/20 0016 01/17/20 0223   01/17/20 0030  azithromycin (ZITHROMAX) 500 mg in sodium chloride 0.9 % 250  mL IVPB     500 mg 250 mL/hr over 60 Minutes Intravenous  Once 01/17/20 0016 01/17/20 0323       I have personally reviewed the following labs and images: CBC: Recent Labs  Lab 01/16/20 2344 01/16/20 2344 01/17/20 0707 01/19/20 0623 01/19/20 1527 01/20/20 0435 01/21/20 0400  WBC 9.3  --  6.2 10.3  --  9.6  --   NEUTROABS 6.3  --  5.3  --   --   --   --   HGB 10.5*   < > 10.1* 8.5* 8.5* 9.0* 10.1*  HCT 33.9*   < > 32.2* 27.2* 27.0* 28.3* 32.6*  MCV 96.6  --  95.5 95.1  --  95.3  --   PLT 263  --  265 224  --  197  --    < > = values in this interval not displayed.   BMP &GFR Recent Labs  Lab 01/18/20 0342 01/18/20 1244 01/19/20 0623 01/20/20 0435 01/21/20 0400  NA 135 137 138 138 139  K 4.2 3.5 3.6 3.7 4.0  CL 99 99 99 100 103  CO2 25 27 27 24 25   GLUCOSE 313* 265* 194* 172* 157*  BUN 34* 35* 36* 25* 18  CREATININE 2.19* 2.08* 1.66* 1.03* 0.92  CALCIUM 6.3* 6.4* 6.6* 6.9* 7.6*  MG 0.6* 1.4* 1.2* 1.7 1.8  PHOS  --  3.5 1.8* 2.6 1.9*   Estimated Creatinine Clearance: 48.5 mL/min (by C-G formula based on SCr of 0.92 mg/dL). Liver & Pancreas: Recent Labs  Lab 01/16/20 2344 01/16/20 2344 01/17/20 0707 01/18/20 1244 01/19/20 0623 01/20/20 0435 01/21/20 0400  AST 19  --  29  --   --   --   --   ALT 15  --  14  --   --   --   --   ALKPHOS 88  --  85  --   --   --   --   BILITOT 0.6  --  1.3*  --   --   --   --   PROT 6.0*  --  5.9*  --   --   --   --   ALBUMIN 1.7*   < > 1.6* 1.6* 2.3* 2.8* 2.9*   < > = values in this interval not displayed.   No results for input(s): LIPASE, AMYLASE in the last 168  hours. No results for input(s): AMMONIA in the last 168 hours. Diabetic: Recent Labs    01/19/20 0840  HGBA1C 8.4*   Recent Labs  Lab 01/20/20 1237 01/20/20 1616 01/20/20 2123 01/21/20 0740 01/21/20 1120  GLUCAP 155* 198* 132* 161* 186*   Cardiac Enzymes: No results for input(s): CKTOTAL, CKMB, CKMBINDEX, TROPONINI in the last 168 hours. No results  for input(s): PROBNP in the last 8760 hours. Coagulation Profile: No results for input(s): INR, PROTIME in the last 168 hours. Thyroid Function Tests: No results for input(s): TSH, T4TOTAL, FREET4, T3FREE, THYROIDAB in the last 72 hours. Lipid Profile: No results for input(s): CHOL, HDL, LDLCALC, TRIG, CHOLHDL, LDLDIRECT in the last 72 hours. Anemia Panel: Recent Labs    01/19/20 1527  VITAMINB12 604  FOLATE 36.8  FERRITIN 686*  TIBC 155*  IRON 48  RETICCTPCT 3.2*   Urine analysis:    Component Value Date/Time   COLORURINE YELLOW 01/18/2020 1715   APPEARANCEUR HAZY (A) 01/18/2020 1715   LABSPEC 1.013 01/18/2020 1715   PHURINE 5.0 01/18/2020 1715   GLUCOSEU NEGATIVE 01/18/2020 1715   HGBUR NEGATIVE 01/18/2020 [place] 01/18/2020 1715   [place] 01/18/2020 [place] 01/18/2020 1715   UROBILINOGEN 1.0 03/16/2014 0628   NITRITE NEGATIVE 01/18/2020 1715   LEUKOCYTESUR LARGE (A) 01/18/2020 1715   Sepsis Labs: Invalid input(s): PROCALCITONIN, [place]  Microbiology: Recent Results (from the past 240 hour(s))  SARS Coronavirus 2 by RT PCR (hospital order, performed in [facility] hospital lab) Nasopharyngeal Nasopharyngeal Swab     Status: None   Collection Time: 01/17/20  5:24 PM   Specimen: Nasopharyngeal Swab  Result Value Ref Range Status   SARS Coronavirus 2 NEGATIVE NEGATIVE Final    Comment: (NOTE) SARS-CoV-2 target nucleic acids are NOT DETECTED. The SARS-CoV-2 RNA is generally detectable in upper and lower respiratory specimens during the acute phase of infection. The lowest concentration of SARS-CoV-2 viral copies this assay can detect is 250 copies / mL. A negative result does not preclude SARS-CoV-2 infection and should not be used as the sole basis for treatment or other patient management decisions.  A negative result may occur with improper specimen collection / handling, submission of specimen other than  nasopharyngeal swab, presence of viral mutation(s) within the areas targeted by this assay, and inadequate number of viral copies (<250 copies / mL). A negative result must be combined with clinical observations, patient history, and epidemiological information. Fact Sheet for Patients:   StrictlyIdeas.no Fact Sheet for Healthcare Providers: BankingDealers.co.za This test is not yet approved or cleared  by the [place] FDA and has been authorized for detection and/or diagnosis of SARS-CoV-2 by FDA under an Emergency Use Authorization (EUA).  This EUA will remain in effect (meaning this test can be used) for the duration of the COVID-19 declaration under Section 564(b)(1) of the Act, 21 U.S.C. section 360bbb-3(b)(1), unless the authorization is terminated or revoked sooner. Performed at [facility], [place] [address] [place], [place] [address]   Gram stain     Status: None   Collection Time: 01/18/20 11:15 AM   Specimen: Lung, Left; Pleural Fluid  Result Value Ref Range Status   Specimen Description PLEURAL FLUID  Final   Special Requests LEFT LUNG  Final   Gram Stain   Final    WBC PRESENT,BOTH PMN AND MONONUCLEAR NO ORGANISMS SEEN CYTOSPIN SMEAR Performed at [facility], [address] [place], [place] [address]  Report Status 01/19/2020 FINAL  Final  Acid Fast Smear (AFB)     Status: None   Collection Time: 01/18/20 11:15 AM   Specimen: Lung, Left; Pleural Fluid  Result Value Ref Range Status   AFB Specimen Processing Concentration  Final   Acid Fast Smear Negative  Final    Comment: (NOTE) Performed At: [facility] [place], [place] [id number] Rush Farmer MD [id number]    Source (AFB) PLEURAL  Final    Comment: FLUID LEFT LUNG   Culture, body fluid-bottle     Status: None (Preliminary result)   Collection Time: 01/18/20 11:15 AM   Specimen: Pleura  Result Value Ref Range  Status   Specimen Description PLEURAL FLUID  Final   Special Requests LEFT LUNG  Final   Culture   Final    NO GROWTH 3 DAYS Performed at [facility], [address] [place], [place] [address]    Report Status PENDING  Incomplete    Radiology Studies: No results found.   Oshua Mcconaha T. [place]  If 7PM-7AM, please contact night-coverage [email or web address] Password [facility] 01/21/2020, 2:35 PM

## 2020-01-21 NOTE — Progress Notes (Signed)
Progress Note  Patient Name: REALYNN Alexander Date of Encounter: 01/21/2020  Primary Cardiologist: Adora Fridge, MD  Subjective    headache; no chest pain  Inpatient Medications    Scheduled Meds: . apixaban  5 mg Oral BID  . atorvastatin  10 mg Oral q1800  . calcium-vitamin D  1 tablet Oral TID  . diltiazem  360 mg Oral Daily  . folic acid  1 mg Oral BID  . insulin aspart  0-15 Units Subcutaneous TID WC  . insulin aspart  0-5 Units Subcutaneous QHS  . levothyroxine  50 mcg Oral QAC breakfast  . metoprolol tartrate  75 mg Oral BID  . pantoprazole  40 mg Oral Daily  . rOPINIRole  0.25 mg Oral QHS  . vitamin B-12  500 mcg Oral Daily   Continuous Infusions: . azithromycin 500 mg (01/20/20 2215)  . cefTRIAXone (ROCEPHIN)  IV 2 g (01/20/20 2122)   PRN Meds: acetaminophen, guaiFENesin-dextromethorphan, ondansetron **OR** ondansetron (ZOFRAN) IV, traMADol   Vital Signs    Vitals:   01/20/20 2123 01/21/20 0037 01/21/20 0554 01/21/20 0737  BP:  125/76 (!) 122/96 (!) 142/96  Pulse: (!) 109 (!) 109 (!) 111 (!) 109  Resp:  20 17 17   Temp:  98.2 F (36.8 C) 98.2 F (36.8 C) 98.3 F (36.8 C)  TempSrc:  Oral Oral Oral  SpO2:  96% 93% 95%  Weight:   84.1 kg   Height:        Intake/Output Summary (Last 24 hours) at 01/21/2020 0820 Last data filed at 01/21/2020 0207 Gross per 24 hour  Intake 640 ml  Output 325 ml  Net 315 ml    I/O since admission: +747  Filed Weights   01/19/20 0454 01/20/20 0452 01/21/20 0554  Weight: 84.1 kg 84.4 kg 84.1 kg    Telemetry    Atrial fibrillation 100 - 119 - Personally Reviewed  ECG    01/17/2020 ECG (independently read by me): AF at 105, PVC  Physical Exam   BP (!) 142/96 (BP Location: Left Arm)   Pulse (!) 109   Temp 98.3 F (36.8 C) (Oral)   Resp 17   Ht 4\' 10"  (1.473 m)   Wt 84.1 kg   SpO2 95%   BMI 38.75 kg/m  General: Alert, oriented, no distress.  Skin: normal turgor, no rashes, warm and dry HEENT: Normocephalic,  atraumatic. Pupils equal round and reactive to light; sclera anicteric; extraocular muscles intact;  Nose without nasal septal hypertrophy Mouth/Parynx benign; Mallinpatti scale 3 Neck: No JVD, no carotid bruits; normal carotid upstroke Lungs: clear to ausculatation and percussion; no wheezing or rales Chest wall: without tenderness to palpitation Heart: PMI not displaced, irregular regular rhythm, rate in the low 100s, s1 s2 normal, 1/6 systolic murmur, no diastolic murmur, no rubs, gallops, thrills, or heaves Abdomen: soft, nontender; no hepatosplenomehaly, BS+; abdominal aorta nontender and not dilated by palpation. Back: no CVA tenderness Pulses 2+ Musculoskeletal: full range of motion, normal strength, no joint deformities Extremities: 1+ pitting edema around ankles no clubbing cyanosis, Homan's sign negative  Neurologic: grossly nonfocal; Cranial nerves grossly wnl Psychologic: Normal mood and affect   Labs    Chemistry Recent Labs  Lab 01/16/20 2344 01/16/20 2344 01/17/20 0707 01/18/20 0342 01/19/20 0623 01/20/20 0435 01/21/20 0400  NA 136   < > 138   < > 138 138 139  K 4.2   < > 4.8   < > 3.6 3.7 4.0  CL 100   < >  97*   < > 99 100 103  CO2 28   < > 26   < > 27 24 25   GLUCOSE 103*   < > 166*   < > 194* 172* 157*  BUN 21   < > 21   < > 36* 25* 18  CREATININE 1.63*   < > 1.62*   < > 1.66* 1.03* 0.92  CALCIUM 6.9*   < > 7.0*   < > 6.6* 6.9* 7.6*  PROT 6.0*  --  5.9*  --   --   --   --   ALBUMIN 1.7*   < > 1.6*   < > 2.3* 2.8* 2.9*  AST 19  --  29  --   --   --   --   ALT 15  --  14  --   --   --   --   ALKPHOS 88  --  85  --   --   --   --   BILITOT 0.6  --  1.3*  --   --   --   --   GFRNONAA 31*   < > 31*   < > 30* 53* >60  GFRAA 35*   < > 36*   < > 35* >60 >60  ANIONGAP 8   < > 15   < > 12 14 11    < > = values in this interval not displayed.     Hematology Recent Labs  Lab 01/17/20 0707 01/17/20 0707 01/19/20 0623 01/19/20 0623 01/19/20 1527 01/20/20 0435  01/21/20 0400  WBC 6.2  --  10.3  --   --  9.6  --   RBC 3.37*   < > 2.86*  --  2.96* 2.97*  --   HGB 10.1*   < > 8.5*   < > 8.5* 9.0* 10.1*  HCT 32.2*   < > 27.2*   < > 27.0* 28.3* 32.6*  MCV 95.5  --  95.1  --   --  95.3  --   MCH 30.0  --  29.7  --   --  30.3  --   MCHC 31.4  --  31.3  --   --  31.8  --   RDW 16.6*  --  16.7*  --   --  17.0*  --   PLT 265  --  224  --   --  197  --    < > = values in this interval not displayed.    Cardiac EnzymesNo results for input(s): TROPONINI in the last 168 hours. No results for input(s): TROPIPOC in the last 168 hours.   HS Troponin: 7 >> 6  BNP Recent Labs  Lab 01/16/20 2344  BNP 526.5*     DDimer No results for input(s): DDIMER in the last 168 hours.   Lipid Panel     Component Value Date/Time   CHOL 189 07/04/2019 1025   TRIG 127 07/04/2019 1025   HDL 78 07/04/2019 1025   CHOLHDL 2.4 07/04/2019 1025   VLDL 31 (H) 01/19/2017 0825   LDLCALC 89 07/04/2019 1025     Radiology    No results found.  Cardiac Studies   ECHO 01/18/2020 IMPRESSIONS  1. Left ventricular ejection fraction, by estimation, is 55 to 60%. The  left ventricle has normal function. The left ventricle demonstrates  regional wall motion abnormalities (see scoring diagram/findings for  description). Paradoxical septal motion due  to RV pacemaker. There is  mild left ventricular hypertrophy. Left  ventricular diastolic function could not be evaluated.  2. Right ventricular systolic function is normal. The right ventricular  size is normal. There is normal pulmonary artery systolic pressure.  3. Large pleural effusion in the left lateral region.  4. The mitral valve is abnormal. Trivial mitral valve regurgitation.  5. The aortic valve is tricuspid. Aortic valve regurgitation is mild.  Mild aortic valve sclerosis is present, with no evidence of aortic valve  stenosis.  6. The inferior vena cava is normal in size with greater than 50%  respiratory  variability, suggesting right atrial pressure of 3 mmHg.   Comparison(s): Changes from prior study are noted. 03/04/2019: LVEF 60-65%.   Patient Profile     74 y.o.femalewith a history of persistent atrial fibrillation on Eliquis, s/p PPM placement in 05/2019 due to sinus pauses, hypertension, hyperlipidemia, type 2 diabetes mellitus, tobacco use, and family history of CAD who is being see for evaluation of atrial fibrillation.  Echocardiogram this admission shows normal LV function and mild aortic insufficiency.   Assessment & Plan    1.  Persistent atrial fibrillation: This occurred in the setting of pneumonia and recent hospitalization for Covid.  Heart rate continues to be around 100 to 115 bpm.  Patient is on Cardizem CD 3 to 60 mg and metoprolol 75 mg twice a day.  Blood pressure remains slightly elevated.  Will titrate metoprolol to 100 mg twice a day today.  Patient was transitioned to Eliquis yesterday.  2.  Acute diastolic congestive heart failure: BNP increased at 526.  Echo shows normal LV systolic function.  Edema may also be contributed by low oncotic pressure related to hypoalbuminemia.  Patient received Lasix 40 mg yesterday.  We will give Lasix 20 mg IV today.  3.  Essential hypertension, blood pressure presently 152/88.  Should further improved with increase beta-blocker therapy and diuretic  4.  Acute on chronic stage III kidney disease:  Creatinine 1.63, improved to 0.92  5.  Pneumonia: Currently on Rocephin and azithromycin.  6.  History of pacemaker  7.  Pleural effusion: Status post thoracentesis with 1 L of transudate of fluid; culture negative  Signed, Troy Sine, MD, [facility] 01/21/2020, 8:20 AM

## 2020-01-22 ENCOUNTER — Encounter (HOSPITAL_COMMUNITY): Payer: Self-pay | Admitting: Internal Medicine

## 2020-01-22 LAB — GLUCOSE, CAPILLARY
Glucose-Capillary: 165 mg/dL — ABNORMAL HIGH (ref 70–99)
Glucose-Capillary: 265 mg/dL — ABNORMAL HIGH (ref 70–99)
Glucose-Capillary: 139 mg/dL — ABNORMAL HIGH (ref 70–99)
Glucose-Capillary: 225 mg/dL — ABNORMAL HIGH (ref 70–99)

## 2020-01-22 LAB — RENAL FUNCTION PANEL
Albumin: 2.6 g/dL — ABNORMAL LOW (ref 3.5–5.0)
Anion gap: 13 (ref 5–15)
BUN: 13 mg/dL (ref 8–23)
CO2: 23 mmol/L (ref 22–32)
Calcium: 8.2 mg/dL — ABNORMAL LOW (ref 8.9–10.3)
Chloride: 103 mmol/L (ref 98–111)
Creatinine, Ser: 0.73 mg/dL (ref 0.44–1.00)
GFR calc Af Amer: >60 mL/min (ref >60)
GFR calc non Af Amer: >60 mL/min (ref >60)
Glucose, Bld: 177 mg/dL — ABNORMAL HIGH (ref 70–99)
Phosphorus: 2.8 mg/dL (ref 2.5–4.6)
Potassium: 4.3 mmol/L (ref 3.5–5.1)
Sodium: 139 mmol/L (ref 135–145)

## 2020-01-22 LAB — HEMOGLOBIN AND HEMATOCRIT, BLOOD
HCT: 34.2 % — ABNORMAL LOW (ref 36.0–46.0)
Hemoglobin: 10.6 g/dL — ABNORMAL LOW (ref 12.0–15.0)

## 2020-01-22 LAB — MAGNESIUM: Magnesium: 1.6 mg/dL — ABNORMAL LOW (ref 1.7–2.4)

## 2020-01-22 LAB — CYTOLOGY - NON PAP

## 2020-01-22 MED ORDER — MAGNESIUM SULFATE 4 GM/100ML IV SOLN
4.0000 g | Freq: Once | INTRAVENOUS | Status: AC
  Administered 2020-01-22: 4 g via INTRAVENOUS
  Filled 2020-01-22: qty 100

## 2020-01-22 MED ORDER — FUROSEMIDE 10 MG/ML IJ SOLN
40.0000 mg | Freq: Every day | INTRAMUSCULAR | Status: DC
  Administered 2020-01-22 – 2020-01-23 (×2): 40 mg via INTRAVENOUS
  Filled 2020-01-22 (×2): qty 4

## 2020-01-22 NOTE — Progress Notes (Addendum)
Progress Note  Patient Name: [name] Date of Encounter: 01/22/2020  Primary Cardiologist: Quay Burow, MD   Subjective   Afib rates 90-100 with intermittent peaks up to 130s. She put out 1L urine overnight. No chest pain. On supplemental O2, which is new since COVID PNA.  Inpatient Medications    Scheduled Meds: . apixaban  5 mg Oral BID  . atorvastatin  10 mg Oral q1800  . calcium-vitamin D  1 tablet Oral TID  . diltiazem  360 mg Oral Daily  . folic acid  1 mg Oral BID  . insulin aspart  0-15 Units Subcutaneous TID WC  . insulin aspart  0-5 Units Subcutaneous QHS  . levothyroxine  50 mcg Oral QAC breakfast  . metoprolol tartrate  100 mg Oral BID  . pantoprazole  40 mg Oral Daily  . rOPINIRole  0.25 mg Oral QHS  . vitamin B-12  500 mcg Oral Daily   Continuous Infusions:  PRN Meds: acetaminophen, guaiFENesin-dextromethorphan, ondansetron **OR** ondansetron (ZOFRAN) IV, traMADol   Vital Signs    Vitals:   01/21/20 1122 01/21/20 2152 01/22/20 0036 01/22/20 0646  BP: 129/74 139/64 127/86 (!) 138/93  Pulse: 82 97 86 99  Resp: 17  15 16   Temp: 98.5 F (36.9 C)  98.3 F (36.8 C) 98.6 F (37 C)  TempSrc: Oral  Oral Oral  SpO2: 95%  96% 97%  Weight:    84.4 kg  Height:        Intake/Output Summary (Last 24 hours) at 01/22/2020 0651 Last data filed at 01/21/2020 1915 Gross per 24 hour  Intake 462 ml  Output 1000 ml  Net -538 ml   Last 3 Weights 01/22/2020 01/21/2020 01/20/2020  Weight (lbs) 186 lb 185 lb 6.4 oz 186 lb  Weight (kg) 84.369 kg 84.097 kg 84.369 kg      Telemetry    Afib, HR 90-100, PVCs intermittent high rates to 140 - Personally Reviewed  ECG    No new- Personally Reviewed  Physical Exam   GEN: No acute distress.   Neck: mild JVD Cardiac: Irreg Irreg, no murmurs, rubs, or gallops.  Respiratory: Wheezing. GI: Soft, nontender, non-distended  MS: 1-2+ edema; No deformity. Neuro:  Nonfocal  Psych: Normal affect   Labs    High  Sensitivity Troponin:   Recent Labs  Lab 01/17/20 0356 01/17/20 0707  TROPONINIHS 7 6      Chemistry Recent Labs  Lab 01/16/20 2344 01/16/20 2344 01/17/20 0707 01/18/20 0342 01/19/20 0623 01/20/20 0435 01/21/20 0400  NA 136   < > 138   < > 138 138 139  K 4.2   < > 4.8   < > 3.6 3.7 4.0  CL 100   < > 97*   < > 99 100 103  CO2 28   < > 26   < > 27 24 25   GLUCOSE 103*   < > 166*   < > 194* 172* 157*  BUN 21   < > 21   < > 36* 25* 18  CREATININE 1.63*   < > 1.62*   < > 1.66* 1.03* 0.92  CALCIUM 6.9*   < > 7.0*   < > 6.6* 6.9* 7.6*  PROT 6.0*  --  5.9*  --   --   --   --   ALBUMIN 1.7*   < > 1.6*   < > 2.3* 2.8* 2.9*  AST 19  --  29  --   --   --   --  ALT 15  --  14  --   --   --   --   ALKPHOS 88  --  85  --   --   --   --   BILITOT 0.6  --  1.3*  --   --   --   --   GFRNONAA 31*   < > 31*   < > 30* 53* >60  GFRAA 35*   < > 36*   < > 35* >60 >60  ANIONGAP 8   < > 15   < > 12 14 11    < > = values in this interval not displayed.     Hematology Recent Labs  Lab 01/17/20 0707 01/17/20 0707 01/19/20 0623 01/19/20 0623 01/19/20 1527 01/20/20 0435 01/21/20 0400  WBC 6.2  --  10.3  --   --  9.6  --   RBC 3.37*   < > 2.86*  --  2.96* 2.97*  --   HGB 10.1*   < > 8.5*   < > 8.5* 9.0* 10.1*  HCT 32.2*   < > 27.2*   < > 27.0* 28.3* 32.6*  MCV 95.5  --  95.1  --   --  95.3  --   MCH 30.0  --  29.7  --   --  30.3  --   MCHC 31.4  --  31.3  --   --  31.8  --   RDW 16.6*  --  16.7*  --   --  17.0*  --   PLT 265  --  224  --   --  197  --    < > = values in this interval not displayed.    BNP Recent Labs  Lab 01/16/20 2344  BNP 526.5*     DDimer No results for input(s): DDIMER in the last 168 hours.   Radiology    CT HEAD WO CONTRAST  Result Date: 01/21/2020 CLINICAL DATA:  74 year old female with persistent headache. EXAM: CT HEAD WITHOUT CONTRAST TECHNIQUE: Contiguous axial images were obtained from the base of the skull through the vertex without intravenous  contrast. COMPARISON:  None. FINDINGS: Brain: Moderate age-related atrophy and chronic microvascular ischemic changes. There is no acute intracranial hemorrhage. No mass effect or midline shift. No extra-axial fluid collection. Vascular: No hyperdense vessel or unexpected calcification. Skull: Normal. Negative for fracture or focal lesion. Sinuses/Orbits: There is opacification of right frontal and right sphenoid sinuses and several ethmoid air cells. The mastoid air cells are clear. Other: None IMPRESSION: 1. No acute intracranial pathology. 2. Moderate age-related atrophy and chronic microvascular ischemic changes. 3. Paranasal sinus disease. Electronically Signed   By: Anner Crete M.D.   On: 01/21/2020 19:51    Cardiac Studies   ECHO 01/18/2020 IMPRESSIONS  1. Left ventricular ejection fraction, by estimation, is 55 to 60%. The  left ventricle has normal function. The left ventricle demonstrates  regional wall motion abnormalities (see scoring diagram/findings for  description). Paradoxical septal motion due  to RV pacemaker. There is mild left ventricular hypertrophy. Left  ventricular diastolic function could not be evaluated.  2. Right ventricular systolic function is normal. The right ventricular  size is normal. There is normal pulmonary artery systolic pressure.  3. Large pleural effusion in the left lateral region.  4. The mitral valve is abnormal. Trivial mitral valve regurgitation.  5. The aortic valve is tricuspid. Aortic valve regurgitation is mild.  Mild aortic valve sclerosis is present, with no  evidence of aortic valve  stenosis.  6. The inferior vena cava is normal in size with greater than 50%  respiratory variability, suggesting right atrial pressure of 3 mmHg.   Comparison(s): Changes from prior study are noted. 03/04/2019: LVEF 60-65%.   Patient Profile     74 y.o. 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 Echocardiogram this admission shows normal LV function and mild aortic insufficiency.  Assessment & Plan   Persistent Afib - In the setting of PNA and recent hospitalization for COVID - She is on Cardizem CD 360mg  daily and Metoprolol 75mg  BID. Metoprolol increased yesterday to 100 mg BID for better rate control - Rates generally 90-100, some intermittent peaks to 130s - Eliquis 5mg  BID  Acute diastolic CHF - BNP up to 0000000 - Echo showed normal LV function - Patient is on lasix 40mg  BID at home - IV lasix 20mg  given yesterday, she put out 1L urine - Nephrology consulted for volume overload. Recommended diuretics for acute CHF. Signed off 5/31.  - Creatinine yesterday 0.92 - Patient is still volume overloaded on exam although Albumin is low possible contributing to edema. Will give IV lasix 40 mg  HTN - BB increased as above - cardizem - BPs improved  AKI/CKD stage 3 - creatinine 1.63 on admission>>down to 0.92 - AM labs pending  PNA - per IM  Pleural effusion - s/p thoracentesis with 1L transudate fluid  For questions or updates, please contact [facility] Please consult [email or web address] for contact info under     Signed, Cadence Ninfa Meeker, PA-C  01/22/2020, 6:51 AM    History and all data above reviewed.  Patient examined.  I agree with the findings as above. She denies chest pain or SOB.  She is not really bothered by the atrial fib.   The patient exam reveals [id number]  ,  Lungs: Decreased breath sounds on the left  ,  Abd: Positive bowel sounds, no rebound no guarding, Ext Mild edema  .  All available labs, radiology testing, previous records reviewed. Agree with documented assessment and plan.   Acute diastolic HF:  Continue diuresis.    Atrial fib:  Rate is still elevated.    Might increase  metoprolol further before discharge but I suspect that this will be difficult to control or switch to verapamil to see if that might work better.   Jeneen Rinks Niraj Kudrna  11:02 AM  01/22/2020

## 2020-01-22 NOTE — Care Management Important Message (Signed)
Important Message  Patient Details  Name: [name] MRN: [id number] Date of Birth: [birth year]   Medicare Important Message Given:  Yes     [name] 01/22/2020, 12:59 PM

## 2020-01-22 NOTE — TOC Initial Note (Signed)
Transition of Care [facility]) - Initial/Assessment Note    Patient Details  Name: [name] MRN: [id number] Date of Birth: Oct 02, 1945  Transition of Care [facility]) CM/SW Contact:    Carles Collet, RN Phone Number: 01/22/2020, 10:37 AM  Clinical Narrative:        Damaris Schooner w patient at bedside. She states that she would like the [place] that she went home with last time. Per Epic she was reeased around 4/19 w home O2 and [facility] for [facility] services Patient states she also has RW and 3/1 at home for use. No new DME needs. Alvis Lemmings accepted for [facility] services. Patient states that she will remind family to bring O2 for transport when [place].             Expected Discharge Plan: [place]     Patient Goals and CMS Choice Patient states their goals for this hospitalization and ongoing recovery are:: to go home CMS Medicare.gov Compare Post Acute Care list provided to:: Patient Choice offered to / list presented to : Patient  Expected Discharge Plan and Services Expected Discharge Plan: [place]   Discharge Planning Services: CM Consult                               HH Arranged: PT, OT [facility] Agency: [place] Date [facility] Agency Contacted: 01/22/20 Time HH Agency Contacted: 72 Representative spoke with at [place]: Tommi Rumps  Prior Living Arrangements/Services                  Current home services: DME    Activities of Daily Living      Permission Sought/Granted                  Emotional Assessment              Admission diagnosis:  Acute respiratory failure with hypoxia ([place]) [J96.01] Community acquired pneumonia of right lung, unspecified part of lung [J18.9] Patient Active Problem List   Diagnosis Date Noted  . Acute respiratory failure with hypoxia ([place]) 01/17/2020  . Pleural effusion on left   . Community acquired pneumonia of right lung 01/15/2020  . Peripheral edema 12/26/2019  . COVID-19 virus infection  12/03/2019  . Immunocompromised state due to drug therapy 12/03/2019  . Chronic respiratory failure ([place]) 12/03/2019  . 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 Obesity (BMI 30-39.9) 07/04/2019  . Tachy-brady syndrome ([place]) 06/11/2019  . Sinus pause 05/17/2019  . Hypothyroidism 03/28/2019  . Atrial fibrillation ([place])   . Syncope and collapse 03/03/2019  . Diabetes mellitus type 2, uncomplicated ([place]) [id number]  . History of total bilateral knee replacement 06/22/2017  . Primary osteoarthritis of both hands 08/20/2016  . Hypomagnesemia 07/21/2016  . Thyroid nodule 03/05/2016  . Multiple pulmonary nodules 04/04/2014  . Anemia 11/21/2013  . 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 Bursitis of right shoulder   . GERD (gastroesophageal reflux disease)   . 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 Essential hypertension, benign 10/14/2012  . Hyperlipidemia   . Fatty liver disease, nonalcoholic   . Osteoarthritis of right knee 08/05/2012   PCP:  Alycia Rossetti, MD Pharmacy:   [id number] - [place], [place] [id number] [place] [id number] Phone: [phone] Fax:  [phone]  [facility], [place] [address] [place] [address] Phone: [phone] Fax: [phone]  [facility] [place], [place] - [address] [place] [address] Phone: [phone] Fax: [phone]     Social Determinants of Health ([place]) Interventions    Readmission Risk Interventions Readmission Risk Prevention Plan 12/07/2019  Transportation Screening Complete  PCP or Specialist Appt within 3-5 Days (No Data)  [place] or [place] (No Data)  Medication Review (RN Care Manager) Complete  Some recent data might be hidden

## 2020-01-22 NOTE — Progress Notes (Signed)
PROGRESS NOTE  [name] [id number] DOB: [birth year]   PCP: Alycia Rossetti, MD  Patient is from: Home. Lives with daughter and grandchildren. Uses walker since COVID-19 infection.  DOA: 01/16/2020 LOS: 5  Brief Narrative / Interim history: 74 year old female with history of SSS/PPM, A. fib, DM-2, respiratory failure on 2 L after COVID-19 infection about a month ago. She presented with progressive shortness of breath for 1 week despite increased dose of Lasix by her cardiologist and Levaquin by PCP. He also had associated chest pressure.  In ED, HR in 120s. 95% on 3 L. Signs of fluid overload with JVD and BLE edema. CXR consistent with CHF, moderate left pleural effusion and possible right lung infiltrate. BNP elevated to 526. Cr 1.6. EKG A. fib with mild RVR. Received IV Lasix, empiric antibiotics for pneumonia and admitted for acute on chronic CHF, possible pneumonia and A. fib. Cardiology consulted.  Started on low-dose IV Lasix.  Renal function worse.  5/28-nephrology consulted.  Lasix discontinued.  Started on albumin.  Thoracocentesis with removal of 1 L transudative and culture negative fluid.    Renal function improved significantly.  Restarted on IV Lasix by cardiology.   Subjective: Seen and examined earlier this morning.  No major events overnight of this morning.  Still with mild right-sided headache.  She rates her pain 3/10.  No focal neuro deficits.  Denies chest pain.  Breathing improved.  Denies GI or UTI symptoms.  Objective: Vitals:   01/22/20 0036 01/22/20 0646 01/22/20 0811 01/22/20 0814  BP: 127/86 (!) 138/93 (!) 156/101 (!) 157/92  Pulse: 86 99 97   Resp: 15 16  18   Temp: 98.3 F (36.8 C) 98.6 F (37 C) 98 F (36.7 C)   TempSrc: Oral Oral Oral   SpO2: 96% 97% 98% 96%  Weight:  84.4 kg    Height:        Intake/Output Summary (Last 24 hours) at 01/22/2020 1048 Last data filed at 01/22/2020 0700 Gross per 24 hour  Intake 462 ml  Output 900 ml  Net  -438 ml   Filed Weights   01/20/20 0452 01/21/20 0554 01/22/20 0646  Weight: 84.4 kg 84.1 kg 84.4 kg    Examination:  GENERAL: No apparent distress.  Nontoxic. HEENT: MMM.  Vision and hearing grossly intact.  NECK: Supple.  No apparent JVD.  RESP: 96% on 2 L.  No IWOB.  Fair aeration bilaterally. CVS:  RRR. Heart sounds normal.  ABD/GI/GU: BS+. Abd soft, NTND.  MSK/EXT:  Moves extremities. No apparent deformity.  2+ pitting edema bilaterally. SKIN: no apparent skin lesion or wound NEURO: Awake, alert and oriented appropriately.  No apparent focal neuro deficit. PSYCH: Calm. Normal affect.   Procedures:  -5/28-IR thoracocentesis with removal of 1 L transudative and culture negative fluid  Microbiology summarized: COVID-19 PCR negative. Pleural fluid culture NGTD. Pleural AFB and Fungal culture pending.   Assessment & Plan: Acute on chronic diastolic CHF: Echo with EF of 55 to 60%, RWMA, paradoxical septal motion due to RV pacemaker and large left pleural effusion.  Still with 2+ pitting edema bilaterally.  UOP 1.3 L / 24 hours.  Net 0.  AKI resolved. -Cardiology and nephrology managing-increased IV Lasix to 40 mg -Monitor fluid status, renal function and electrolytes -Sodium and fluid restriction -She is anxious about TED hose due to arthritis. Recommended leg elevation.  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  BUN 34>>18. -Appreciate nephrology  insight and guidance -Continue monitoring  Possible pneumonia: Clinically low suspicion but some radiologic evidence of right lung infiltrate.  This was also present on his CXR on 5/18 and 5/24.  Pro-Cal negative but might not be reliable while on Arava.  She has no fever or leukocytosis.  CXR finding could be sequela of her recent COVID-19 infection. -Completed ceftriaxone and azithromycin empirically on 6/1. -Repeat chest x-ray in 3 to 4 weeks to ensure resolution  Bilateral pleural  effusion, moderate on the left and small on the right-likely due to CHF and hypoalbuminemia.  -IR thoracocentesis on 5/28 with removal of 1 L transudative and culture negative fluid. -Received albumin.  Restarted on Lasix.  Persistent A. fib with RVR: Still with RVR with heart rate 110-120.  -On Cardizem CD 360 mg daily and metoprolol 100 mg twice daily -On Eliquis for anticoagulation. -TEE cardioversion?  She may not need this has HR is improving.  Chronic respiratory failure due to COVID-19 infection-was discharged on 2 L last month.  CXR with persistent bibasilar opacities. -Wean oxygen as able -Incentive spirometry  Uncontrolled DM-2 with hyperglycemia: A1c 8.4%.On Metformin 1000 mg twice daily Recent Labs    01/21/20 1632 01/21/20 2101 01/22/20 0808  GLUCAP 225* 259* 165*  -Continue CBG monitoring and SSI-moderate  History of tachybradycardia syndrome s/p PPM -Per cardiology  Recent COVID-19 infection: Tested positive on 4/12. -No isolation/precaution indicated  Rheumatoid arthritis: On Arava at home. -Arava on hold out of concern for pneumonia  Anemia of chronic disease: B/l Hgb 10-11> 10.5 (admit)> 8.5>9.0>10.6  Anemia panel consistent with ACD.  Denies melena or hematochezia. -Continue monitoring  Hypothyroidism -Continue home Synthroid  Hypomagnesemia hypophosphatemia: Mg 1.6. - IV magnesium sulfate 2 g x 1  Hypocalcemia-corrects to 8.4. -Per nephrology.  Debility: Has been using walker since discharge from hospital after COVID-19 hospitalization. -PT/OT  Restless leg syndrome -Continue Requip  Headache: No red flags. -Tylenol 1 g every 8 hours as needed, and tramadol 50 mg every 12 hours as needed            DVT prophylaxis: On heparin drip for A. fib Code Status: Full code Family Communication: Patient and/or RN. Available if any question.  Status is: Inpatient  Remains inpatient appropriate because:Hemodynamically unstable, Ongoing diagnostic  testing needed not appropriate for outpatient work up and IV treatments appropriate due to intensity of illness or inability to take PO   Dispo: The patient is from: Home              Anticipated d/c is to: Home              Anticipated d/c date is: 3 days              Patient currently is not medically stable to d/c.        Consultants:  Cardiology IR Nephrology   Sch Meds:  Scheduled Meds: . apixaban  5 mg Oral BID  . atorvastatin  10 mg Oral q1800  . calcium-vitamin D  1 tablet Oral TID  . diltiazem  360 mg Oral Daily  . folic acid  1 mg Oral BID  . furosemide  40 mg Intravenous Daily  . insulin aspart  0-15 Units Subcutaneous TID WC  . insulin aspart  0-5 Units Subcutaneous QHS  . levothyroxine  50 mcg Oral QAC breakfast  . metoprolol tartrate  100 mg Oral BID  . pantoprazole  40 mg Oral Daily  . rOPINIRole  0.25 mg Oral QHS  .  vitamin B-12  500 mcg Oral Daily   Continuous Infusions: . magnesium sulfate bolus IVPB     PRN Meds:.acetaminophen, guaiFENesin-dextromethorphan, ondansetron **OR** ondansetron (ZOFRAN) IV, traMADol  Antimicrobials: Anti-infectives (From admission, onward)   Start     Dose/Rate Route Frequency Ordered Stop   01/17/20 2200  cefTRIAXone (ROCEPHIN) 2 g in sodium chloride 0.9 % 100 mL IVPB     2 g 200 mL/hr over 30 Minutes Intravenous Every 24 hours 01/17/20 0251 01/21/20 2233   01/17/20 2200  azithromycin (ZITHROMAX) 500 mg in sodium chloride 0.9 % 250 mL IVPB     500 mg 250 mL/hr over 60 Minutes Intravenous Every 24 hours 01/17/20 0251 01/22/20 0056   01/17/20 0030  cefTRIAXone (ROCEPHIN) 1 g in sodium chloride 0.9 % 100 mL IVPB     1 g 200 mL/hr over 30 Minutes Intravenous  Once 01/17/20 0016 01/17/20 0223   01/17/20 0030  azithromycin (ZITHROMAX) 500 mg in sodium chloride 0.9 % 250 mL IVPB     500 mg 250 mL/hr over 60 Minutes Intravenous  Once 01/17/20 0016 01/17/20 0323       I have personally reviewed the following labs and  images: CBC: Recent Labs  Lab 01/16/20 2344 01/16/20 2344 01/17/20 0707 01/17/20 0707 01/19/20 0623 01/19/20 1527 01/20/20 0435 01/21/20 0400 01/22/20 0825  WBC 9.3  --  6.2  --  10.3  --  9.6  --   --   NEUTROABS 6.3  --  5.3  --   --   --   --   --   --   HGB 10.5*   < > 10.1*   < > 8.5* 8.5* 9.0* 10.1* 10.6*  HCT 33.9*   < > 32.2*   < > 27.2* 27.0* 28.3* 32.6* 34.2*  MCV 96.6  --  95.5  --  95.1  --  95.3  --   --   PLT 263  --  265  --  224  --  197  --   --    < > = values in this interval not displayed.   BMP &GFR Recent Labs  Lab 01/18/20 1244 01/19/20 0623 01/20/20 0435 01/21/20 0400 01/22/20 0825  NA 137 138 138 139 139  K 3.5 3.6 3.7 4.0 4.3  CL 99 99 100 103 103  CO2 27 27 24 25 23   GLUCOSE 265* 194* 172* 157* 177*  BUN 35* 36* 25* 18 13  CREATININE 2.08* 1.66* 1.03* 0.92 0.73  CALCIUM 6.4* 6.6* 6.9* 7.6* 8.2*  MG 1.4* 1.2* 1.7 1.8 1.6*  PHOS 3.5 1.8* 2.6 1.9* 2.8   Estimated Creatinine Clearance: 55.9 mL/min (by C-G formula based on SCr of 0.73 mg/dL). Liver & Pancreas: Recent Labs  Lab 01/16/20 2344 01/16/20 2344 01/17/20 0707 01/17/20 0707 01/18/20 1244 01/19/20 0623 01/20/20 0435 01/21/20 0400 01/22/20 0825  AST 19  --  29  --   --   --   --   --   --   ALT 15  --  14  --   --   --   --   --   --   ALKPHOS 88  --  85  --   --   --   --   --   --   BILITOT 0.6  --  1.3*  --   --   --   --   --   --   PROT 6.0*  --  5.9*  --   --   --   --   --   --  ALBUMIN 1.7*   < > 1.6*   < > 1.6* 2.3* 2.8* 2.9* 2.6*   < > = values in this interval not displayed.   No results for input(s): LIPASE, AMYLASE in the last 168 hours. No results for input(s): AMMONIA in the last 168 hours. Diabetic: No results for input(s): HGBA1C in the last 72 hours. Recent Labs  Lab 01/21/20 0740 01/21/20 1120 01/21/20 1632 01/21/20 2101 01/22/20 0808  GLUCAP 161* 186* 225* 259* 165*   Cardiac Enzymes: No results for input(s): CKTOTAL, CKMB, CKMBINDEX, TROPONINI  in the last 168 hours. No results for input(s): PROBNP in the last 8760 hours. Coagulation Profile: No results for input(s): INR, PROTIME in the last 168 hours. Thyroid Function Tests: No results for input(s): TSH, T4TOTAL, FREET4, T3FREE, THYROIDAB in the last 72 hours. Lipid Profile: No results for input(s): CHOL, HDL, LDLCALC, TRIG, CHOLHDL, LDLDIRECT in the last 72 hours. Anemia Panel: Recent Labs    01/19/20 1527  VITAMINB12 604  FOLATE 36.8  FERRITIN 686*  TIBC 155*  IRON 48  RETICCTPCT 3.2*   Urine analysis:    Component Value Date/Time   COLORURINE YELLOW 01/18/2020 1715   APPEARANCEUR HAZY (A) 01/18/2020 1715   LABSPEC 1.013 01/18/2020 1715   PHURINE 5.0 01/18/2020 1715   GLUCOSEU NEGATIVE 01/18/2020 1715   HGBUR NEGATIVE 01/18/2020 [place] 01/18/2020 1715   [place] 01/18/2020 [place] 01/18/2020 1715   UROBILINOGEN 1.0 03/16/2014 0628   NITRITE NEGATIVE 01/18/2020 1715   LEUKOCYTESUR LARGE (A) 01/18/2020 1715   Sepsis Labs: Invalid input(s): PROCALCITONIN, [place]  Microbiology: Recent Results (from the past 240 hour(s))  SARS Coronavirus 2 by RT PCR (hospital order, performed in [facility] hospital lab) Nasopharyngeal Nasopharyngeal Swab     Status: None   Collection Time: 01/17/20  5:24 PM   Specimen: Nasopharyngeal Swab  Result Value Ref Range Status   SARS Coronavirus 2 NEGATIVE NEGATIVE Final    Comment: (NOTE) SARS-CoV-2 target nucleic acids are NOT DETECTED. The SARS-CoV-2 RNA is generally detectable in upper and lower respiratory specimens during the acute phase of infection. The lowest concentration of SARS-CoV-2 viral copies this assay can detect is 250 copies / mL. A negative result does not preclude SARS-CoV-2 infection and should not be used as the sole basis for treatment or other patient management decisions.  A negative result may occur with improper specimen collection / handling,  submission of specimen other than nasopharyngeal swab, presence of viral mutation(s) within the areas targeted by this assay, and inadequate number of viral copies (<250 copies / mL). A negative result must be combined with clinical observations, patient history, and epidemiological information. Fact Sheet for Patients:   StrictlyIdeas.no Fact Sheet for Healthcare Providers: BankingDealers.co.za This test is not yet approved or cleared  by the [place] FDA and has been authorized for detection and/or diagnosis of SARS-CoV-2 by FDA under an Emergency Use Authorization (EUA).  This EUA will remain in effect (meaning this test can be used) for the duration of the COVID-19 declaration under Section 564(b)(1) of the Act, 21 U.S.C. section 360bbb-3(b)(1), unless the authorization is terminated or revoked sooner. Performed at [facility], [place] [address] [place], [place] [address]   Gram stain     Status: None   Collection Time: 01/18/20 11:15 AM   Specimen: Lung, Left; Pleural Fluid  Result Value Ref Range Status   Specimen Description PLEURAL FLUID  Final   Special Requests LEFT  LUNG  Final   Gram Stain   Final    WBC PRESENT,BOTH PMN AND MONONUCLEAR NO ORGANISMS SEEN CYTOSPIN SMEAR Performed at [facility], [place], [place], [place] [address]    Report Status 01/19/2020 FINAL  Final  Acid Fast Smear (AFB)     Status: None   Collection Time: 01/18/20 11:15 AM   Specimen: Lung, Left; Pleural Fluid  Result Value Ref Range Status   AFB Specimen Processing Concentration  Final   Acid Fast Smear Negative  Final    Comment: (NOTE) Performed At: [facility] [place], [place] [id number] Rush Farmer MD [id number]    Source (AFB) PLEURAL  Final    Comment: FLUID LEFT LUNG   Culture, body fluid-bottle     Status: None (Preliminary result)   Collection Time: 01/18/20 11:15 AM    Specimen: Pleura  Result Value Ref Range Status   Specimen Description PLEURAL FLUID  Final   Special Requests LEFT LUNG  Final   Culture   Final    NO GROWTH 4 DAYS Performed at [facility], [address], [place], [place] [address]    Report Status PENDING  Incomplete    Radiology Studies: CT HEAD WO CONTRAST  Result Date: 01/21/2020 CLINICAL DATA:  74 year old female with persistent headache. EXAM: CT HEAD WITHOUT CONTRAST TECHNIQUE: Contiguous axial images were obtained from the base of the skull through the vertex without intravenous contrast. COMPARISON:  None. FINDINGS: Brain: Moderate age-related atrophy and chronic microvascular ischemic changes. There is no acute intracranial hemorrhage. No mass effect or midline shift. No extra-axial fluid collection. Vascular: No hyperdense vessel or unexpected calcification. Skull: Normal. Negative for fracture or focal lesion. Sinuses/Orbits: There is opacification of right frontal and right sphenoid sinuses and several ethmoid air cells. The mastoid air cells are clear. Other: None IMPRESSION: 1. No acute intracranial pathology. 2. Moderate age-related atrophy and chronic microvascular ischemic changes. 3. Paranasal sinus disease. Electronically Signed   By: Anner Crete M.D.   On: 01/21/2020 19:51     Earlee Herald T. [place]  If 7PM-7AM, please contact night-coverage [email or web address] Password [facility] 01/22/2020, 10:48 AM

## 2020-01-23 LAB — RENAL FUNCTION PANEL
Albumin: 2.3 g/dL — ABNORMAL LOW (ref 3.5–5.0)
Anion gap: 11 (ref 5–15)
BUN: 14 mg/dL (ref 8–23)
CO2: 22 mmol/L (ref 22–32)
Calcium: 8.3 mg/dL — ABNORMAL LOW (ref 8.9–10.3)
Chloride: 103 mmol/L (ref 98–111)
Creatinine, Ser: 0.73 mg/dL (ref 0.44–1.00)
GFR calc Af Amer: >60 mL/min (ref >60)
GFR calc non Af Amer: >60 mL/min (ref >60)
Glucose, Bld: 173 mg/dL — ABNORMAL HIGH (ref 70–99)
Phosphorus: 2.4 mg/dL — ABNORMAL LOW (ref 2.5–4.6)
Potassium: 3.9 mmol/L (ref 3.5–5.1)
Sodium: 136 mmol/L (ref 135–145)

## 2020-01-23 LAB — GLUCOSE, CAPILLARY
Glucose-Capillary: 166 mg/dL — ABNORMAL HIGH (ref 70–99)
Glucose-Capillary: 247 mg/dL — ABNORMAL HIGH (ref 70–99)
Glucose-Capillary: 212 mg/dL — ABNORMAL HIGH (ref 70–99)
Glucose-Capillary: 149 mg/dL — ABNORMAL HIGH (ref 70–99)

## 2020-01-23 LAB — T3, FREE: T3, Free: 2.1 pg/mL (ref 2.0–4.4)

## 2020-01-23 LAB — CULTURE, BODY FLUID W GRAM STAIN -BOTTLE: Culture: NO GROWTH

## 2020-01-23 LAB — MAGNESIUM: Magnesium: 1.7 mg/dL (ref 1.7–2.4)

## 2020-01-23 LAB — HEMOGLOBIN AND HEMATOCRIT, BLOOD
HCT: 33.6 % — ABNORMAL LOW (ref 36.0–46.0)
Hemoglobin: 10.6 g/dL — ABNORMAL LOW (ref 12.0–15.0)

## 2020-01-23 MED ORDER — VERAPAMIL HCL ER 180 MG PO TBCR
360.0000 mg | EXTENDED_RELEASE_TABLET | Freq: Every day | ORAL | Status: DC
  Administered 2020-01-24 – 2020-01-25 (×2): 360 mg via ORAL
  Filled 2020-01-23 (×2): qty 2

## 2020-01-23 MED ORDER — FUROSEMIDE 10 MG/ML IJ SOLN
40.0000 mg | Freq: Two times a day (BID) | INTRAMUSCULAR | Status: DC
  Administered 2020-01-23 – 2020-01-25 (×5): 40 mg via INTRAVENOUS
  Filled 2020-01-23 (×5): qty 4

## 2020-01-23 MED ORDER — VERAPAMIL HCL ER 240 MG PO TBCR: 240.0000 mg | EXTENDED_RELEASE_TABLET | Freq: Every day | ORAL | Status: DC

## 2020-01-23 MED ORDER — MAGNESIUM SULFATE 2 GM/50ML IV SOLN
2.0000 g | Freq: Once | INTRAVENOUS | Status: AC
  Administered 2020-01-23: 2 g via INTRAVENOUS
  Filled 2020-01-23: qty 50

## 2020-01-23 NOTE — Progress Notes (Signed)
Physical Therapy Treatment Patient Details Name: [name] MRN: [id number] DOB: 1945-10-03 Today's Date: 01/23/2020    History of Present Illness Pt is a 74 y/o female admitted secondary to SOB. Pt found to have acute respiratory failure with hypoxia likely a combination of CHF and Pneumonia. Of note, pt with recent +COVID one month ago. PMH including but not limited to a-fib, DM, HTN and RA.    PT Comments    Pt sitting up in recliner with he legs down on entry, reporting she is eager to get home but was told that the doctors want to "get some more fluid off." Pt is limited today by 3/10 cervical pain from sleeping in recliner. Prior to ambulation, pt able to perform some shoulder and cervical ROM exercises before ambulation. Pt is supervision for transfers and min guard for ambulation with RW. Pt with much less SoB today and able to ambulate further. Once back in room educated pt on need to keep her LE elevated to aid in edema reduction. D/c plans remain appropriate. PT will continue to follow acutely.     Follow Up Recommendations  Home health PT;Supervision/Assistance - 24 hour     Equipment Recommendations  None recommended by PT       Precautions / Restrictions Precautions Precautions: Fall Precaution Comments: monitor SpO2 Restrictions Weight Bearing Restrictions: No    Mobility  Bed Mobility               General bed mobility comments: OOB in recliner on entry   Transfers Overall transfer level: Needs assistance Equipment used: Rolling walker (2 wheeled) Transfers: Sit to/from Stand Sit to Stand: Supervision         General transfer comment: good power up and steadying before reaching for RW  Ambulation/Gait Ambulation/Gait assistance: Min guard Gait Distance (Feet): 140 Feet Assistive device: Rolling walker (2 wheeled) Gait Pattern/deviations: Step-through pattern;Decreased stride length Gait velocity: decreased Gait velocity interpretation: 1.31 -  2.62 ft/sec, indicative of limited community ambulator General Gait Details: min guard for safety with slow, steady gait      Balance Overall balance assessment: Needs assistance Sitting-balance support: Feet supported Sitting balance-Leahy Scale: Good     Standing balance support: Bilateral upper extremity supported Standing balance-Leahy Scale: Poor                              Cognition Arousal/Alertness: Awake/alert Behavior During Therapy: WFL for tasks assessed/performed Overall Cognitive Status: Within Functional Limits for tasks assessed                                        Exercises      General Comments General comments (skin integrity, edema, etc.): Pt ambulated on 2L O2, SaO2 pleth wave was lost, however pt with no SoB or symptoms of desaturation with ambulation. B LE still have significant edema so elevated once seated back in recliner      Pertinent Vitals/Pain Pain Location: headache Pain Descriptors / Indicators: Aching;Discomfort           PT Goals (current goals can now be found in the care plan section) Acute Rehab PT Goals Patient Stated Goal: "to go home" PT Goal Formulation: With patient Time For Goal Achievement: 02/02/20 Potential to Achieve Goals: Good Progress towards PT goals: Progressing toward goals    Frequency  Min 3X/week      PT Plan Current plan remains appropriate       AM-PAC PT "6 Clicks" Mobility   Outcome Measure  Help needed turning from your back to your side while in a flat bed without using bedrails?: None Help needed moving from lying on your back to sitting on the side of a flat bed without using bedrails?: None Help needed moving to and from a bed to a chair (including a wheelchair)?: None Help needed standing up from a chair using your arms (e.g., wheelchair or bedside chair)?: None Help needed to walk in hospital room?: None Help needed climbing 3-5 steps with a railing? : A  Little 6 Click Score: 23    End of Session Equipment Utilized During Treatment: Oxygen Activity Tolerance: Patient limited by fatigue Patient left: in chair;with call bell/phone within reach Nurse Communication: Mobility status PT Visit Diagnosis: Other abnormalities of gait and mobility (R26.89);Muscle weakness (generalized) (M62.81)     Time: [id number] PT Time Calculation (min) (ACUTE ONLY): 15 min  Charges:  $Gait Training: 8-22 mins                     Kelii Chittum B. Migdalia Dk PT, DPT Acute Rehabilitation Services Pager [phone] Office [phone]    [place] 01/23/2020, 12:46 PM

## 2020-01-23 NOTE — Progress Notes (Addendum)
Progress Note  Patient Name: [name] Date of Encounter: 01/23/2020  Primary Cardiologist: Quay Burow, MD   Subjective   Patient put out 953mL urine overnight. She is in afib with rates 90-100. She was unable to lay flat overnight. She feels sob on exertion. No chest pain.   Inpatient Medications    Scheduled Meds: . apixaban  5 mg Oral BID  . atorvastatin  10 mg Oral q1800  . calcium-vitamin D  1 tablet Oral TID  . diltiazem  360 mg Oral Daily  . folic acid  1 mg Oral BID  . furosemide  40 mg Intravenous Daily  . insulin aspart  0-15 Units Subcutaneous TID WC  . insulin aspart  0-5 Units Subcutaneous QHS  . levothyroxine  50 mcg Oral QAC breakfast  . metoprolol tartrate  100 mg Oral BID  . pantoprazole  40 mg Oral Daily  . rOPINIRole  0.25 mg Oral QHS  . vitamin B-12  500 mcg Oral Daily   Continuous Infusions:  PRN Meds: acetaminophen, guaiFENesin-dextromethorphan, ondansetron **OR** ondansetron (ZOFRAN) IV, traMADol   Vital Signs    Vitals:   01/22/20 1955 01/22/20 2135 01/23/20 0554 01/23/20 0739  BP: (!) 142/78 134/65 (!) 154/86 132/87  Pulse: 88 (!) 108 98 89  Resp: 15  20 20   Temp: 98.6 F (37 C)  97.9 F (36.6 C) 97.9 F (36.6 C)  TempSrc: Oral  Oral Oral  SpO2: 98%  95% 96%  Weight:   84.6 kg   Height:        Intake/Output Summary (Last 24 hours) at 01/23/2020 0744 Last data filed at 01/23/2020 0500 Gross per 24 hour  Intake 360 ml  Output 900 ml  Net -540 ml   Last 3 Weights 01/23/2020 01/22/2020 01/21/2020  Weight (lbs) 186 lb 9.6 oz 186 lb 185 lb 6.4 oz  Weight (kg) 84.641 kg 84.369 kg 84.097 kg      Telemetry    Afib, HR 90-100, PVCs, intermittently elevated rates - Personally Reviewed  ECG    No new - Personally Reviewed  Physical Exam   GEN: No acute distress.   Neck: mild JVD Cardiac: Irreg Irreg, no murmurs, rubs, or gallops.  Respiratory: Clear to auscultation bilaterally. GI: Soft, nontender, non-distended  MS: 1-2+  edema; No deformity. Neuro:  Nonfocal  Psych: Normal affect   Labs    High Sensitivity Troponin:   Recent Labs  Lab 01/17/20 0356 01/17/20 0707  TROPONINIHS 7 6      Chemistry Recent Labs  Lab 01/16/20 2344 01/16/20 2344 01/17/20 0707 01/18/20 0342 01/20/20 0435 01/21/20 0400 01/22/20 0825  NA 136   < > 138   < > 138 139 139  K 4.2   < > 4.8   < > 3.7 4.0 4.3  CL 100   < > 97*   < > 100 103 103  CO2 28   < > 26   < > 24 25 23   GLUCOSE 103*   < > 166*   < > 172* 157* 177*  BUN 21   < > 21   < > 25* 18 13  CREATININE 1.63*   < > 1.62*   < > 1.03* 0.92 0.73  CALCIUM 6.9*   < > 7.0*   < > 6.9* 7.6* 8.2*  PROT 6.0*  --  5.9*  --   --   --   --   ALBUMIN 1.7*   < > 1.6*   < >  2.8* 2.9* 2.6*  AST 19  --  29  --   --   --   --   ALT 15  --  14  --   --   --   --   ALKPHOS 88  --  85  --   --   --   --   BILITOT 0.6  --  1.3*  --   --   --   --   GFRNONAA 31*   < > 31*   < > 53* >60 >60  GFRAA 35*   < > 36*   < > >60 >60 >60  ANIONGAP 8   < > 15   < > 14 11 13    < > = values in this interval not displayed.     Hematology Recent Labs  Lab 01/17/20 0707 01/17/20 0707 01/19/20 0623 01/19/20 0623 01/19/20 1527 01/19/20 1527 01/20/20 0435 01/21/20 0400 01/22/20 0825  WBC 6.2  --  10.3  --   --   --  9.6  --   --   RBC 3.37*   < > 2.86*  --  2.96*  --  2.97*  --   --   HGB 10.1*   < > 8.5*   < > 8.5*   < > 9.0* 10.1* 10.6*  HCT 32.2*   < > 27.2*   < > 27.0*   < > 28.3* 32.6* 34.2*  MCV 95.5  --  95.1  --   --   --  95.3  --   --   MCH 30.0  --  29.7  --   --   --  30.3  --   --   MCHC 31.4  --  31.3  --   --   --  31.8  --   --   RDW 16.6*  --  16.7*  --   --   --  17.0*  --   --   PLT 265  --  224  --   --   --  197  --   --    < > = values in this interval not displayed.    BNP Recent Labs  Lab 01/16/20 2344  BNP 526.5*     DDimer No results for input(s): DDIMER in the last 168 hours.   Radiology    CT HEAD WO CONTRAST  Result Date:  01/21/2020 CLINICAL DATA:  74 year old female with persistent headache. EXAM: CT HEAD WITHOUT CONTRAST TECHNIQUE: Contiguous axial images were obtained from the base of the skull through the vertex without intravenous contrast. COMPARISON:  None. FINDINGS: Brain: Moderate age-related atrophy and chronic microvascular ischemic changes. There is no acute intracranial hemorrhage. No mass effect or midline shift. No extra-axial fluid collection. Vascular: No hyperdense vessel or unexpected calcification. Skull: Normal. Negative for fracture or focal lesion. Sinuses/Orbits: There is opacification of right frontal and right sphenoid sinuses and several ethmoid air cells. The mastoid air cells are clear. Other: None IMPRESSION: 1. No acute intracranial pathology. 2. Moderate age-related atrophy and chronic microvascular ischemic changes. 3. Paranasal sinus disease. Electronically Signed   By: Anner Crete M.D.   On: 01/21/2020 19:51    Cardiac Studies   ECHO 01/18/2020 IMPRESSIONS  1. Left ventricular ejection fraction, by estimation, is 55 to 60%. The  left ventricle has normal function. The left ventricle demonstrates  regional wall motion abnormalities (see scoring diagram/findings for  description). Paradoxical septal motion due  to RV pacemaker.  There is mild left ventricular hypertrophy. Left  ventricular diastolic function could not be evaluated.  2. Right ventricular systolic function is normal. The right ventricular  size is normal. There is normal pulmonary artery systolic pressure.  3. Large pleural effusion in the left lateral region.  4. The mitral valve is abnormal. Trivial mitral valve regurgitation.  5. The aortic valve is tricuspid. Aortic valve regurgitation is mild.  Mild aortic valve sclerosis is present, with no evidence of aortic valve  stenosis.  6. The inferior vena cava is normal in size with greater than 50%  respiratory variability, suggesting right atrial pressure  of 3 mmHg.   Comparison(s): Changes from prior study are noted. 03/04/2019: LVEF 60-65%.   Patient Profile     74 y.o. 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 Echocardiogram this admission shows normal LV function and mild aortic insufficiency.  Assessment & Plan    Persistent Afib - In the setting of PNA and recent hospitalization for COVID - She is on Cardizem CD 360mg  daily and Metoprolol 75mg  BID. Metoprolol increased  to 100 mg BID for better rate control - Rates are 90-100 with intermittent elevation - Eliquis 5mg  BID - Can consider TEE/DCCV  Acute diastolic CHF - BNP up to 0000000 - Echo showed normal LV function - Patient is on lasix 40mg  BID at home - IV lasix 40mg  given yesterday, she put out 900L urine - Creatinine 0.73.  - Volume status is improving although still has extra fluid on exam. Albumin is low possible contributing to edema. Continue with diuresis.  HTN - BB increased as above - cardizem - BPs improved  AKI/CKD stage 3 - creatinine 1.63 on admission>>down to 0.73 - AM labs pending  PNA - per IM  Pleural effusion - s/p thoracentesis with 1L transudate fluid  For questions or updates, please contact [facility] Please consult [email or web address] for contact info under        Signed, Cadence Ninfa Meeker, PA-C  01/23/2020, 7:44 AM    History and all data above reviewed.  Patient examined.  I agree with the findings as above. Her rates are still not controlled.    The patient exam reveals [id number]   ,  Lungs: Clear  ,  Abd: Positive bowel sounds, no rebound no guarding, Ext Moderately severe edema  .  All available labs, radiology testing, previous records reviewed. Agree with documented assessment and plan.  Atrial fib:  Rate  not controlled.  I will change to verapamil.  Acute on chronic diastolic HF:  Continue IV diuresis.    Jeneen Rinks [facility]  10:58 AM  01/23/2020\

## 2020-01-23 NOTE — Progress Notes (Signed)
PROGRESS NOTE    [name]  [id number] DOB: Jan 31, 1946 DOA: 01/16/2020 PCP: Alycia Rossetti, MD   Brief Narrative: 74 year old with past medical history significant for SSS/PPM, A. fib, diabetes type 2, chronic respiratory failure on 2 L of oxygen since diagnosis of COVID-19 a month ago, who presented with progressive shortness of breath for 1 week despite increased dose of Lexis by her cardiologist and Levaquin prescribed by her PCP.  She also was complaining of chest pressure.  In the ED heart rate 120, oxygen saturation 95 on 3 L, patient with signs of volume overload with positive JVD and bilateral lower extremity edema, chest x-ray consistent with CHF and moderate left pleural effusion and possible right lung infiltrates.  BNP 526, creatinine 1.6, EKG A. fib with RVR.  Patient received IV Lexis, empiric antibiotics for pneumonia and admitted for acute on chronic systolic heart failure exacerbation and possible pneumonia and A. fib with RVR.  Cardiology consulted.  She was a started on low-dose Lasix.  Renal function got worse. On 5/28 nephrology was consulted.  Lasix was discontinued.  The patient was a started on albumin.  Thoracentesis was performed yielding 1 L transudative and culture negative fluid. No function improved significantly and patient was restarted on IV Lexis by cardiology.    Assessment & Plan:   Principal Problem:   Acute respiratory failure with hypoxia (HCC) Active Problems:   Essential hypertension, benign   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 without organ or systems involvement (HCC)   Atrial fibrillation (HCC)   Hypothyroidism   Tachy-brady syndrome ([place])   Community acquired pneumonia of right lung   Pleural effusion on left  1-Acute on Chronic CHF: Ejection fraction 55 to 60%, R WMA, paradoxical septal motion due to right ventricular pacemaker and large left pleural effusion. -Continue with IV Laxis -Urine  output 900  cc overnight.  -1 liter -Weight:182---186.  2- AKI on CKD 3B;  Baseline creatinine 0.9 Peak up to 2.1 during this hospitalization. Creatinine down to 0.7 Monitor on IV Laxis  3-Possible pneumonia: Pleated ceftriaxone and azithromycin on 6/1 She will need repeated chest x-ray in 4 weeks to ensure resolution  4-Bilateral pleural effusion, moderate on the left and small on the right due to CHF and hypoalbuminemia Underwent IR thoracentesis on 5/28 with removal of 1 L transudative and culture negative fluid  5-Persistent A. fib with RVR On  metoprolol. Cardizem change to verapamil  Might need cardioversion this admission cardiology following and managing  6-chronic respiratory failure due to COVID-19 infection and 2 L of oxygen Stable.    Estimated body mass index is 39 kg/m as calculated from the following:   Height as of this encounter: 4\' 10"  (1.473 m).   Weight as of this encounter: 84.6 kg.   DVT prophylaxis:  Code Status: Full code Family Communication: Care discussed with patient Disposition Plan:  Status is: Inpatient  Remains inpatient appropriate because:IV treatments appropriate due to intensity of illness or inability to take PO.  Patient is still requiring IV Lexis for volume overload   Dispo: The patient is from: Home              Anticipated d/c is to: Home              Anticipated d/c date is: 2 days              Patient currently is not medically stable to d/c.     Consultants:  Cardiology  Nephrology  Procedures:  -5/28-IR thoracocentesis with removal of 1 L transudative and culture negative fluid   microbiology COVID-19 PCR negative. Pleural fluid culture NGTD. Pleural AFB and Fungal culture pending.   Subjective: Still reports shortness of breath on exertion, does not feel back to baseline.  Report cough  Objective: Vitals:   01/23/20 0554 01/23/20 0739 01/23/20 1207 01/23/20 1610  BP: (!) 154/86 132/87 116/63 122/71  Pulse: 98  89 74 74  Resp: 20 20 20 20   Temp: 97.9 F (36.6 C) 97.9 F (36.6 C) 98.2 F (36.8 C) 98.4 F (36.9 C)  TempSrc: Oral Oral Oral Oral  SpO2: 95% 96% 95% 96%  Weight: 84.6 kg     Height:        Intake/Output Summary (Last 24 hours) at 01/23/2020 1612 Last data filed at 01/23/2020 0500 Gross per 24 hour  Intake --  Output 700 ml  Net -700 ml   Filed Weights   01/21/20 0554 01/22/20 0646 01/23/20 0554  Weight: 84.1 kg 84.4 kg 84.6 kg    Examination:  General exam: Appears calm and comfortable  Respiratory system: Clear to auscultation. Respiratory effort normal. Cardiovascular system: S1 & S2 heard, RRR. No JVD, murmurs, rubs, gallops or clicks. No pedal edema. Gastrointestinal system: Abdomen is nondistended, soft and nontender. No organomegaly or masses felt. Normal bowel sounds heard. Central nervous system: Alert and oriented. No focal neurological deficits. Extremities: Symmetric 5 x 5 power. Skin: No rashes, lesions or ulcers Psychiatry: Judgement and insight appear normal. Mood & affect appropriate.     Data Reviewed: I have personally reviewed following labs and imaging studies  CBC: Recent Labs  Lab 01/16/20 2344 01/16/20 2344 01/17/20 0707 01/17/20 0707 01/19/20 0623 01/19/20 0623 01/19/20 1527 01/20/20 0435 01/21/20 0400 01/22/20 0825 01/23/20 0652  WBC 9.3  --  6.2  --  10.3  --   --  9.6  --   --   --   NEUTROABS 6.3  --  5.3  --   --   --   --   --   --   --   --   HGB 10.5*   < > 10.1*   < > 8.5*   < > 8.5* 9.0* 10.1* 10.6* 10.6*  HCT 33.9*   < > 32.2*   < > 27.2*   < > 27.0* 28.3* 32.6* 34.2* 33.6*  MCV 96.6  --  95.5  --  95.1  --   --  95.3  --   --   --   PLT 263  --  265  --  224  --   --  197  --   --   --    < > = values in this interval not displayed.   Basic Metabolic Panel: Recent Labs  Lab 01/19/20 0623 01/20/20 0435 01/21/20 0400 01/22/20 0825 01/23/20 0652  NA 138 138 139 139 136  K 3.6 3.7 4.0 4.3 3.9  CL 99 100 103 103 103   CO2 27 24 25 23 22   GLUCOSE 194* 172* 157* 177* 173*  BUN 36* 25* 18 13 14   CREATININE 1.66* 1.03* 0.92 0.73 0.73  CALCIUM 6.6* 6.9* 7.6* 8.2* 8.3*  MG 1.2* 1.7 1.8 1.6* 1.7  PHOS 1.8* 2.6 1.9* 2.8 2.4*   GFR: Estimated Creatinine Clearance: 56 mL/min (by C-G formula based on SCr of 0.73 mg/dL). Liver Function Tests: Recent Labs  Lab 01/16/20 2344 01/16/20 2344 01/17/20 0707 01/18/20 1244 01/19/20  [id number] 01/20/20 0435 01/21/20 0400 01/22/20 0825 01/23/20 0652  AST 19  --  29  --   --   --   --   --   --   ALT 15  --  14  --   --   --   --   --   --   ALKPHOS 88  --  85  --   --   --   --   --   --   BILITOT 0.6  --  1.3*  --   --   --   --   --   --   PROT 6.0*  --  5.9*  --   --   --   --   --   --   ALBUMIN 1.7*   < > 1.6*   < > 2.3* 2.8* 2.9* 2.6* 2.3*   < > = values in this interval not displayed.   No results for input(s): LIPASE, AMYLASE in the last 168 hours. No results for input(s): AMMONIA in the last 168 hours. Coagulation Profile: No results for input(s): INR, PROTIME in the last 168 hours. Cardiac Enzymes: No results for input(s): CKTOTAL, CKMB, CKMBINDEX, TROPONINI in the last 168 hours. BNP (last 3 results) No results for input(s): PROBNP in the last 8760 hours. HbA1C: No results for input(s): HGBA1C in the last 72 hours. CBG: Recent Labs  Lab 01/22/20 1135 01/22/20 1629 01/22/20 2243 01/23/20 0741 01/23/20 1136  GLUCAP 265* 139* 225* 166* 247*   Lipid Profile: No results for input(s): CHOL, HDL, LDLCALC, TRIG, CHOLHDL, LDLDIRECT in the last 72 hours. Thyroid Function Tests: No results for input(s): TSH, T4TOTAL, FREET4, T3FREE, THYROIDAB in the last 72 hours. Anemia Panel: No results for input(s): VITAMINB12, FOLATE, FERRITIN, TIBC, IRON, RETICCTPCT in the last 72 hours. Sepsis Labs: Recent Labs  Lab 01/17/20 0707 01/18/20 0342 01/19/20 0623  PROCALCITON 0.12 <0.10 <0.10    Recent Results (from the past 240 hour(s))  SARS Coronavirus 2  by RT PCR (hospital order, performed in [facility] hospital lab) Nasopharyngeal Nasopharyngeal Swab     Status: None   Collection Time: 01/17/20  5:24 PM   Specimen: Nasopharyngeal Swab  Result Value Ref Range Status   SARS Coronavirus 2 NEGATIVE NEGATIVE Final    Comment: (NOTE) SARS-CoV-2 target nucleic acids are NOT DETECTED. The SARS-CoV-2 RNA is generally detectable in upper and lower respiratory specimens during the acute phase of infection. The lowest concentration of SARS-CoV-2 viral copies this assay can detect is 250 copies / mL. A negative result does not preclude SARS-CoV-2 infection and should not be used as the sole basis for treatment or other patient management decisions.  A negative result may occur with improper specimen collection / handling, submission of specimen other than nasopharyngeal swab, presence of viral mutation(s) within the areas targeted by this assay, and inadequate number of viral copies (<250 copies / mL). A negative result must be combined with clinical observations, patient history, and epidemiological information. Fact Sheet for Patients:   StrictlyIdeas.no Fact Sheet for Healthcare Providers: BankingDealers.co.za This test is not yet approved or cleared  by the [place] FDA and has been authorized for detection and/or diagnosis of SARS-CoV-2 by FDA under an Emergency Use Authorization (EUA).  This EUA will remain in effect (meaning this test can be used) for the duration of the COVID-19 declaration under Section 564(b)(1) of the Act, 21 U.S.C. section 360bbb-3(b)(1), unless the authorization is terminated or revoked sooner. Performed  at [facility], [place] [address] [place], [place] [address]   Gram stain     Status: None   Collection Time: 01/18/20 11:15 AM   Specimen: Lung, Left; Pleural Fluid  Result Value Ref Range Status   Specimen Description PLEURAL FLUID  Final   Special  Requests LEFT LUNG  Final   Gram Stain   Final    WBC PRESENT,BOTH PMN AND MONONUCLEAR NO ORGANISMS SEEN CYTOSPIN SMEAR Performed at [facility], [address] [place], [place] [address]    Report Status 01/19/2020 FINAL  Final  Fungus Culture With Stain     Status: None (Preliminary result)   Collection Time: 01/18/20 11:15 AM   Specimen: Lung, Left; Pleural Fluid  Result Value Ref Range Status   Fungus Stain Final report  Final    Comment: (NOTE) Performed At: [facility] [place], [place] [id number] Rush Farmer MD [id number]    Fungus (Mycology) Culture PENDING  Incomplete   Fungal Source PLEURAL  Final    Comment: FLUID LEFT LUNG   Acid Fast Smear (AFB)     Status: None   Collection Time: 01/18/20 11:15 AM   Specimen: Lung, Left; Pleural Fluid  Result Value Ref Range Status   AFB Specimen Processing Concentration  Final   Acid Fast Smear Negative  Final    Comment: (NOTE) Performed At: [facility] [place], [place] [id number] Rush Farmer MD [id number]    Source (AFB) PLEURAL  Final    Comment: FLUID LEFT LUNG   Culture, body fluid-bottle     Status: None   Collection Time: 01/18/20 11:15 AM   Specimen: Pleura  Result Value Ref Range Status   Specimen Description PLEURAL FLUID  Final   Special Requests LEFT LUNG  Final   Culture   Final    NO GROWTH 5 DAYS Performed at [facility], [address], [place], [place] [address]    Report Status 01/23/2020 FINAL  Final  Fungus Culture Result     Status: None   Collection Time: 01/18/20 11:15 AM  Result Value Ref Range Status   Result 1 Comment  Final    Comment: (NOTE) KOH/Calcofluor preparation:  no fungus observed. Performed At: [facility] [place], [place] [id number] Rush Farmer MD [id number]          Radiology Studies: CT HEAD WO CONTRAST  Result Date: 01/21/2020 CLINICAL DATA:  74 year old  female with persistent headache. EXAM: CT HEAD WITHOUT CONTRAST TECHNIQUE: Contiguous axial images were obtained from the base of the skull through the vertex without intravenous contrast. COMPARISON:  None. FINDINGS: Brain: Moderate age-related atrophy and chronic microvascular ischemic changes. There is no acute intracranial hemorrhage. No mass effect or midline shift. No extra-axial fluid collection. Vascular: No hyperdense vessel or unexpected calcification. Skull: Normal. Negative for fracture or focal lesion. Sinuses/Orbits: There is opacification of right frontal and right sphenoid sinuses and several ethmoid air cells. The mastoid air cells are clear. Other: None IMPRESSION: 1. No acute intracranial pathology. 2. Moderate age-related atrophy and chronic microvascular ischemic changes. 3. Paranasal sinus disease. Electronically Signed   By: Anner Crete M.D.   On: 01/21/2020 19:51        Scheduled Meds: . apixaban  5 mg Oral BID  . atorvastatin  10 mg Oral q1800  . calcium-vitamin D  1 tablet Oral TID  . folic acid  1 mg Oral BID  . furosemide  40 mg Intravenous BID  . insulin aspart  0-15 Units Subcutaneous TID WC  . insulin aspart  0-5 Units Subcutaneous QHS  . levothyroxine  50 mcg Oral QAC breakfast  . metoprolol tartrate  100 mg Oral BID  . pantoprazole  40 mg Oral Daily  . rOPINIRole  0.25 mg Oral QHS  . [START ON 01/24/2020] verapamil  360 mg Oral Daily  . vitamin B-12  500 mcg Oral Daily   Continuous Infusions:   LOS: 6 days    Time spent: 35 minutes.     Elmarie Shiley, MD [facility]   If 7PM-7AM, please contact night-coverage [email or web address]  01/23/2020, 4:12 PM

## 2020-01-23 NOTE — Progress Notes (Signed)
Inpatient Diabetes Program Recommendations  AACE/ADA: New Consensus Statement on Inpatient Glycemic Control (2015)  Target Ranges:  Prepandial:   less than 140 mg/dL      Peak postprandial:   less than 180 mg/dL (1-2 hours)      Critically ill patients:  140 - 180 mg/dL   Results for [name], [name] (MRN [id number]) as of 01/23/2020 09:34  Ref. Range 01/22/2020 08:08 01/22/2020 11:35 01/22/2020 16:29 01/22/2020 22:43  Glucose-Capillary Latest Ref Range: 70 - 99 mg/dL 165 (H)  3 units NOVOLOG  265 (H)  8 units NOVOLOG  139 (H)  2 units NOVOLOG  225 (H)  2 units NOVOLOG     Home DM Meds: Metformin 1000 mg BID  Current Orders: Novolog Moderate Correction Scale/ SSI (0-15 units) TID AC + HS     MD- Note patient eating 90-100% meals.  Home Metformin on hold at present.  Please consider adding Novolog Meal Coverage to current inpatient insulin regimen:  Novolog 3 units TID with meals  (Please add the following Hold Parameters: Hold if pt eats <50% of meal, Hold if pt NPO)    --Will follow patient during hospitalization--  Wyn Quaker RN, MSN, CDE Diabetes Coordinator Inpatient Glycemic Control Team Team Pager: [phone] (8a-5p)

## 2020-01-24 LAB — RENAL FUNCTION PANEL
Albumin: 2.3 g/dL — ABNORMAL LOW (ref 3.5–5.0)
Anion gap: 10 (ref 5–15)
BUN: 12 mg/dL (ref 8–23)
CO2: 26 mmol/L (ref 22–32)
Calcium: 8.4 mg/dL — ABNORMAL LOW (ref 8.9–10.3)
Chloride: 100 mmol/L (ref 98–111)
Creatinine, Ser: 0.78 mg/dL (ref 0.44–1.00)
GFR calc Af Amer: >60 mL/min (ref >60)
GFR calc non Af Amer: >60 mL/min (ref >60)
Glucose, Bld: 152 mg/dL — ABNORMAL HIGH (ref 70–99)
Phosphorus: 2.7 mg/dL (ref 2.5–4.6)
Potassium: 3.3 mmol/L — ABNORMAL LOW (ref 3.5–5.1)
Sodium: 136 mmol/L (ref 135–145)

## 2020-01-24 LAB — GLUCOSE, CAPILLARY
Glucose-Capillary: 158 mg/dL — ABNORMAL HIGH (ref 70–99)
Glucose-Capillary: 223 mg/dL — ABNORMAL HIGH (ref 70–99)
Glucose-Capillary: 235 mg/dL — ABNORMAL HIGH (ref 70–99)
Glucose-Capillary: 188 mg/dL — ABNORMAL HIGH (ref 70–99)

## 2020-01-24 LAB — HEMOGLOBIN AND HEMATOCRIT, BLOOD
HCT: 33.4 % — ABNORMAL LOW (ref 36.0–46.0)
Hemoglobin: 10.3 g/dL — ABNORMAL LOW (ref 12.0–15.0)

## 2020-01-24 LAB — MAGNESIUM: Magnesium: 1.3 mg/dL — ABNORMAL LOW (ref 1.7–2.4)

## 2020-01-24 MED ORDER — POTASSIUM CHLORIDE CRYS ER 20 MEQ PO TBCR
40.0000 meq | EXTENDED_RELEASE_TABLET | Freq: Once | ORAL | Status: AC
  Administered 2020-01-24: 40 meq via ORAL
  Filled 2020-01-24: qty 2

## 2020-01-24 MED ORDER — INSULIN GLARGINE 100 UNIT/ML ~~LOC~~ SOLN
5.0000 [IU] | Freq: Every day | SUBCUTANEOUS | Status: DC
  Administered 2020-01-24 – 2020-01-26 (×3): 5 [IU] via SUBCUTANEOUS
  Filled 2020-01-24 (×3): qty 0.05

## 2020-01-24 MED ORDER — MAGNESIUM SULFATE 2 GM/50ML IV SOLN
2.0000 g | Freq: Once | INTRAVENOUS | Status: AC
  Administered 2020-01-24: 2 g via INTRAVENOUS
  Filled 2020-01-24: qty 50

## 2020-01-24 NOTE — Progress Notes (Addendum)
Progress Note  Patient Name: [name] Date of Encounter: 01/24/2020  Primary Cardiologist: Quay Burow, MD   Subjective   Afib rates around 100 bpm. It says patient put out 59mL urine overnight however patient says it was much more than that. No chest pain. Has some neck and shoulder pain for the position she sleeps in the chair.   Inpatient Medications    Scheduled Meds: . apixaban  5 mg Oral BID  . atorvastatin  10 mg Oral q1800  . calcium-vitamin D  1 tablet Oral TID  . folic acid  1 mg Oral BID  . furosemide  40 mg Intravenous BID  . insulin aspart  0-15 Units Subcutaneous TID WC  . insulin aspart  0-5 Units Subcutaneous QHS  . levothyroxine  50 mcg Oral QAC breakfast  . metoprolol tartrate  100 mg Oral BID  . pantoprazole  40 mg Oral Daily  . rOPINIRole  0.25 mg Oral QHS  . verapamil  360 mg Oral Daily  . vitamin B-12  500 mcg Oral Daily   Continuous Infusions:  PRN Meds: acetaminophen, guaiFENesin-dextromethorphan, ondansetron **OR** ondansetron (ZOFRAN) IV, traMADol   Vital Signs    Vitals:   01/23/20 1940 01/23/20 2144 01/24/20 0056 01/24/20 0545  BP: 133/88 (!) 143/74 (!) 143/87 (!) 143/93  Pulse: 82 (!) 101 (!) 125 (!) 106  Resp: 16  20   Temp: 98.5 F (36.9 C)  98.4 F (36.9 C) 98.1 F (36.7 C)  TempSrc: Oral  Oral Oral  SpO2: 97%  96% 96%  Weight:    83.6 kg  Height:        Intake/Output Summary (Last 24 hours) at 01/24/2020 0643 Last data filed at 01/23/2020 1612 Gross per 24 hour  Intake 111 ml  Output 500 ml  Net -389 ml   Last 3 Weights 01/24/2020 01/23/2020 01/22/2020  Weight (lbs) 184 lb 4.8 oz 186 lb 9.6 oz 186 lb  Weight (kg) 83.598 kg 84.641 kg 84.369 kg      Telemetry    Afib, HR around 100 with occasional elevation - Personally Reviewed  ECG    Pending - Personally Reviewed  Physical Exam   GEN: No acute distress.   Neck: mild JVD Cardiac: RRR, no murmurs, rubs, or gallops.  Respiratory: Clear to auscultation  bilaterally. GI: Soft, nontender, non-distended  MS: 2+ edema; No deformity. Neuro:  Nonfocal  Psych: Normal affect   Labs    High Sensitivity Troponin:   Recent Labs  Lab 01/17/20 0356 01/17/20 0707  TROPONINIHS 7 6      Chemistry Recent Labs  Lab 01/17/20 0707 01/18/20 0342 01/22/20 0825 01/23/20 0652 01/24/20 0327  NA 138   < > 139 136 136  K 4.8   < > 4.3 3.9 3.3*  CL 97*   < > 103 103 100  CO2 26   < > 23 22 26   GLUCOSE 166*   < > 177* 173* 152*  BUN 21   < > 13 14 12   CREATININE 1.62*   < > 0.73 0.73 0.78  CALCIUM 7.0*   < > 8.2* 8.3* 8.4*  PROT 5.9*  --   --   --   --   ALBUMIN 1.6*   < > 2.6* 2.3* 2.3*  AST 29  --   --   --   --   ALT 14  --   --   --   --   ALKPHOS 85  --   --   --   --  BILITOT 1.3*  --   --   --   --   GFRNONAA 31*   < > >60 >60 >60  GFRAA 36*   < > >60 >60 >60  ANIONGAP 15   < > 13 11 10    < > = values in this interval not displayed.     Hematology Recent Labs  Lab 01/17/20 0707 01/17/20 0707 01/19/20 0623 01/19/20 0623 01/19/20 1527 01/19/20 1527 01/20/20 0435 01/21/20 0400 01/22/20 0825 01/23/20 0652 01/24/20 0327  WBC 6.2  --  10.3  --   --   --  9.6  --   --   --   --   RBC 3.37*   < > 2.86*  --  2.96*  --  2.97*  --   --   --   --   HGB 10.1*   < > 8.5*   < > 8.5*   < > 9.0*   < > 10.6* 10.6* 10.3*  HCT 32.2*   < > 27.2*   < > 27.0*   < > 28.3*   < > 34.2* 33.6* 33.4*  MCV 95.5  --  95.1  --   --   --  95.3  --   --   --   --   MCH 30.0  --  29.7  --   --   --  30.3  --   --   --   --   MCHC 31.4  --  31.3  --   --   --  31.8  --   --   --   --   RDW 16.6*  --  16.7*  --   --   --  17.0*  --   --   --   --   PLT 265  --  224  --   --   --  197  --   --   --   --    < > = values in this interval not displayed.    BNPNo results for input(s): BNP, PROBNP in the last 168 hours.   DDimer No results for input(s): DDIMER in the last 168 hours.   Radiology    No results found.  Cardiac Studies   ECHO  01/18/2020 IMPRESSIONS  1. Left ventricular ejection fraction, by estimation, is 55 to 60%. The  left ventricle has normal function. The left ventricle demonstrates  regional wall motion abnormalities (see scoring diagram/findings for  description). Paradoxical septal motion due  to RV pacemaker. There is mild left ventricular hypertrophy. Left  ventricular diastolic function could not be evaluated.  2. Right ventricular systolic function is normal. The right ventricular  size is normal. There is normal pulmonary artery systolic pressure.  3. Large pleural effusion in the left lateral region.  4. The mitral valve is abnormal. Trivial mitral valve regurgitation.  5. The aortic valve is tricuspid. Aortic valve regurgitation is mild.  Mild aortic valve sclerosis is present, with no evidence of aortic valve  stenosis.  6. The inferior vena cava is normal in size with greater than 50%  respiratory variability, suggesting right atrial pressure of 3 mmHg.   Comparison(s): Changes from prior study are noted. 03/04/2019: LVEF 60-65%.   Patient Profile     74 y.o. 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 Echocardiogram this admission shows normal LV function and mild aortic insufficiency.  Assessment & Plan    Persistent Afib - In the setting of PNA and recent hospitalization for COVID - Eliquis5mg  BID -She was on Cardizem CD 360mg  daily and Metoprolol 100 mg BIDfor better rate control - Rates were suboptimal and cardizem was switched to verapamil. She will get her first dose this AM. Rates around 100. - Might need cardioversion  Acute diastolic CHF - BNP up to 0000000 - Echo showed normal LV function - Patient is on lasix 40mg  BID at home - IV lasix 40mg  BID   - Recorded that patient out out 500L urine however this is incomplete. Weight down 2lbs - Creatinine 0.78 - Volume status is improving although still has extra fluid on exam.Albumin is low possible contributing to edema. Continue with diuresis.  Hypokalemia/hypomagnesemia - will supplement  HTN - BB increased as above - cardizem changed to verapamil - BPs stable  AKI/CKD stage 3 - creatinine 1.63 on admission>>down to 0.78 - AM labs pending  PNA - per IM  Pleural effusion - s/p thoracentesis with 1L transudate fluid  For questions or updates, please contact [facility] Please consult [email or web address] for contact info under    Signed, Cadence Ninfa Meeker, PA-C  01/24/2020, 6:43 AM    History and all data above reviewed.  Patient examined.  I agree with the findings as above.  She reports that she is breathing better but not at baseline.    The patient exam reveals [id number]  ,  Lungs: Decreased breath sounds still at the bases  ,  Abd: Positive bowel sounds, no rebound no guarding, Ext Moderate leg edema  .  All available labs, radiology testing, previous records reviewed. Agree with documented assessment and plan.   Acute diastolic HF.   Good weight reduction and tolerating current dose of diuretic.  Unfortunately I/O are incomplete.  I would continue current IV diuresis.  I switched to Verapamil and her HR is improving.  I do not think we will need DCCV.   Jeneen Rinks Mark Benecke  11:13 AM  01/24/2020

## 2020-01-24 NOTE — Progress Notes (Addendum)
PROGRESS NOTE    [name]  [id number] DOB: 1946/06/15 DOA: 01/16/2020 PCP: Alycia Rossetti, MD   Brief Narrative: 74 year old with past medical history significant for SSS/PPM, A. fib, diabetes type 2, chronic respiratory failure on 2 L of oxygen since diagnosis of COVID-19 a month ago, who presented with progressive shortness of breath for 1 week despite increased dose of Lexis by her cardiologist and Levaquin prescribed by her PCP.  She also was complaining of chest pressure.  In the ED heart rate 120, oxygen saturation 95 on 3 L, patient with signs of volume overload with positive JVD and bilateral lower extremity edema, chest x-ray consistent with CHF and moderate left pleural effusion and possible right lung infiltrates.  BNP 526, creatinine 1.6, EKG A. fib with RVR.  Patient received IV Lexis, empiric antibiotics for pneumonia and admitted for acute on chronic systolic heart failure exacerbation and possible pneumonia and A. fib with RVR.  Cardiology consulted.  She was a started on low-dose Lasix.  Renal function got worse. On 5/28 nephrology was consulted.  Lasix was discontinued.  The patient was a started on albumin.  Thoracentesis was performed yielding 1 L transudative and culture negative fluid. No function improved significantly and patient was restarted on IV Lexis by cardiology.    Assessment & Plan:   Principal Problem:   Acute respiratory failure with hypoxia (HCC) Active Problems:   Essential hypertension, benign   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 without organ or systems involvement (HCC)   Atrial fibrillation (HCC)   Hypothyroidism   Tachy-brady syndrome ([place])   Community acquired pneumonia of right lung   Pleural effusion on left  1-Acute on Chronic CHF: Ejection fraction 55 to 60%, R WMA, paradoxical septal motion due to right ventricular pacemaker and large left pleural effusion. -Continue with IV Laxis 40 mg BID -Urine   output 500 cc overnight.  -1 liter -Weight:182---186--184 -Appreciate cardiology assistance.   2- AKI on CKD 3B;  Baseline creatinine 0.9 Peak up to 2.1 during this hospitalization. Creatinine down to 0.7 Monitor on IV Laxis Renal function stable.   3-Possible pneumonia: Pleated ceftriaxone and azithromycin on 6/1 She will need repeated chest x-ray in 4 weeks to ensure resolution  4-Bilateral pleural effusion, moderate on the left and small on the right due to CHF and hypoalbuminemia Underwent IR thoracentesis on 5/28 with removal of 1 L transudative and culture negative fluid  5-Persistent A. fib with RVR On  metoprolol. Cardizem change to verapamil  Might need cardioversion this admission cardiology following and managing  6-chronic respiratory failure due to COVID-19 infection and 2 L of oxygen Stable.   Hypomagnesemia; replete orally.  DM; start low dose lantus. SSI.  Hba1c at 8 Estimated body mass index is 38.52 kg/m as calculated from the following:   Height as of this encounter: 4\' 10"  (1.473 m).   Weight as of this encounter: 83.6 kg.   DVT prophylaxis:  Code Status: Full code Family Communication: Care discussed with patient Disposition Plan:  Status is: Inpatient  Remains inpatient appropriate because:IV treatments appropriate due to intensity of illness or inability to take PO.  Patient is still requiring IV Laxis for volume overload   Dispo: The patient is from: Home              Anticipated d/c is to: Home              Anticipated d/c date is: 2 days  Patient currently is not medically stable to d/c.     Consultants:   Cardiology  Nephrology  Procedures:  -5/28-IR thoracocentesis with removal of 1 L transudative and culture negative fluid   microbiology COVID-19 PCR negative. Pleural fluid culture NGTD. Pleural AFB and Fungal culture pending.   Subjective: Not breathing at baseline.  LE edema persist,    Objective: Vitals:   01/24/20 0056 01/24/20 0545 01/24/20 0911 01/24/20 1255  BP: (!) 143/87 (!) 143/93 137/75 134/66  Pulse: (!) 125 (!) 106 (!) 106 69  Resp: 20  20 20   Temp: 98.4 F (36.9 C) 98.1 F (36.7 C) 98.9 F (37.2 C) 98.1 F (36.7 C)  TempSrc: Oral Oral Oral Oral  SpO2: 96% 96% 95% 97%  Weight:  83.6 kg    Height:        Intake/Output Summary (Last 24 hours) at 01/24/2020 1353 Last data filed at 01/24/2020 0751 Gross per 24 hour  Intake 161 ml  Output 500 ml  Net -339 ml   Filed Weights   01/22/20 0646 01/23/20 0554 01/24/20 0545  Weight: 84.4 kg 84.6 kg 83.6 kg    Examination:  General exam: NAD Respiratory system: CTA Cardiovascular system: S 1, S 2 RRR Gastrointestinal system: BS present, soft, nt, nd Central nervous system: non focal Extremities: plus two edema  Data Reviewed: I have personally reviewed following labs and imaging studies  CBC: Recent Labs  Lab 01/19/20 0623 01/19/20 1527 01/20/20 0435 01/21/20 0400 01/22/20 0825 01/23/20 0652 01/24/20 0327  WBC 10.3  --  9.6  --   --   --   --   HGB 8.5*   < > 9.0* 10.1* 10.6* 10.6* 10.3*  HCT 27.2*   < > 28.3* 32.6* 34.2* 33.6* 33.4*  MCV 95.1  --  95.3  --   --   --   --   PLT 224  --  197  --   --   --   --    < > = values in this interval not displayed.   Basic Metabolic Panel: Recent Labs  Lab 01/20/20 0435 01/21/20 0400 01/22/20 0825 01/23/20 0652 01/24/20 0327  NA 138 139 139 136 136  K 3.7 4.0 4.3 3.9 3.3*  CL 100 103 103 103 100  CO2 24 25 23 22 26   GLUCOSE 172* 157* 177* 173* 152*  BUN 25* 18 13 14 12   CREATININE 1.03* 0.92 0.73 0.73 0.78  CALCIUM 6.9* 7.6* 8.2* 8.3* 8.4*  MG 1.7 1.8 1.6* 1.7 1.3*  PHOS 2.6 1.9* 2.8 2.4* 2.7   GFR: Estimated Creatinine Clearance: 55.6 mL/min (by C-G formula based on SCr of 0.78 mg/dL). Liver Function Tests: Recent Labs  Lab 01/20/20 0435 01/21/20 0400 01/22/20 0825 01/23/20 0652 01/24/20 0327  ALBUMIN 2.8* 2.9* 2.6* 2.3*  2.3*   No results for input(s): LIPASE, AMYLASE in the last 168 hours. No results for input(s): AMMONIA in the last 168 hours. Coagulation Profile: No results for input(s): INR, PROTIME in the last 168 hours. Cardiac Enzymes: No results for input(s): CKTOTAL, CKMB, CKMBINDEX, TROPONINI in the last 168 hours. BNP (last 3 results) No results for input(s): PROBNP in the last 8760 hours. HbA1C: No results for input(s): HGBA1C in the last 72 hours. CBG: Recent Labs  Lab 01/23/20 1136 01/23/20 1614 01/23/20 2120 01/24/20 0806 01/24/20 1100  GLUCAP 247* 212* 149* 158* 223*   Lipid Profile: No results for input(s): CHOL, HDL, LDLCALC, TRIG, CHOLHDL, LDLDIRECT in the last 72 hours.  Thyroid Function Tests: No results for input(s): TSH, T4TOTAL, FREET4, T3FREE, THYROIDAB in the last 72 hours. Anemia Panel: No results for input(s): VITAMINB12, FOLATE, FERRITIN, TIBC, IRON, RETICCTPCT in the last 72 hours. Sepsis Labs: Recent Labs  Lab 01/18/20 0342 01/19/20 0623  PROCALCITON <0.10 <0.10    Recent Results (from the past 240 hour(s))  SARS Coronavirus 2 by RT PCR (hospital order, performed in [facility] hospital lab) Nasopharyngeal Nasopharyngeal Swab     Status: None   Collection Time: 01/17/20  5:24 PM   Specimen: Nasopharyngeal Swab  Result Value Ref Range Status   SARS Coronavirus 2 NEGATIVE NEGATIVE Final    Comment: (NOTE) SARS-CoV-2 target nucleic acids are NOT DETECTED. The SARS-CoV-2 RNA is generally detectable in upper and lower respiratory specimens during the acute phase of infection. The lowest concentration of SARS-CoV-2 viral copies this assay can detect is 250 copies / mL. A negative result does not preclude SARS-CoV-2 infection and should not be used as the sole basis for treatment or other patient management decisions.  A negative result may occur with improper specimen collection / handling, submission of specimen other than nasopharyngeal swab, presence of  viral mutation(s) within the areas targeted by this assay, and inadequate number of viral copies (<250 copies / mL). A negative result must be combined with clinical observations, patient history, and epidemiological information. Fact Sheet for Patients:   StrictlyIdeas.no Fact Sheet for Healthcare Providers: BankingDealers.co.za This test is not yet approved or cleared  by the [place] FDA and has been authorized for detection and/or diagnosis of SARS-CoV-2 by FDA under an Emergency Use Authorization (EUA).  This EUA will remain in effect (meaning this test can be used) for the duration of the COVID-19 declaration under Section 564(b)(1) of the Act, 21 U.S.C. section 360bbb-3(b)(1), unless the authorization is terminated or revoked sooner. Performed at [facility], [place] [address], [place], [place] [address]   Gram stain     Status: None   Collection Time: 01/18/20 11:15 AM   Specimen: Lung, Left; Pleural Fluid  Result Value Ref Range Status   Specimen Description PLEURAL FLUID  Final   Special Requests LEFT LUNG  Final   Gram Stain   Final    WBC PRESENT,BOTH PMN AND MONONUCLEAR NO ORGANISMS SEEN CYTOSPIN SMEAR Performed at [facility], [address] [place], [place] [address]    Report Status 01/19/2020 FINAL  Final  Fungus Culture With Stain     Status: None (Preliminary result)   Collection Time: 01/18/20 11:15 AM   Specimen: Lung, Left; Pleural Fluid  Result Value Ref Range Status   Fungus Stain Final report  Final    Comment: (NOTE) Performed At: [facility] [place], [place] [id number] Rush Farmer MD [id number]    Fungus (Mycology) Culture PENDING  Incomplete   Fungal Source PLEURAL  Final    Comment: FLUID LEFT LUNG   Acid Fast Smear (AFB)     Status: None   Collection Time: 01/18/20 11:15 AM   Specimen: Lung, Left; Pleural Fluid  Result Value Ref Range Status    AFB Specimen Processing Concentration  Final   Acid Fast Smear Negative  Final    Comment: (NOTE) Performed At: [facility] [place], [place] [id number] Rush Farmer MD [id number]    Source (AFB) PLEURAL  Final    Comment: FLUID LEFT LUNG   Culture, body fluid-bottle     Status: None   Collection  Time: 01/18/20 11:15 AM   Specimen: Pleura  Result Value Ref Range Status   Specimen Description PLEURAL FLUID  Final   Special Requests LEFT LUNG  Final   Culture   Final    NO GROWTH 5 DAYS Performed at [facility], [address], [place], [place] [address]    Report Status 01/23/2020 FINAL  Final  Fungus Culture Result     Status: None   Collection Time: 01/18/20 11:15 AM  Result Value Ref Range Status   Result 1 Comment  Final    Comment: (NOTE) KOH/Calcofluor preparation:  no fungus observed. Performed At: [facility] [place], [place] [id number] Rush Farmer MD [id number]          Radiology Studies: No results found.      Scheduled Meds: . apixaban  5 mg Oral BID  . atorvastatin  10 mg Oral q1800  . calcium-vitamin D  1 tablet Oral TID  . folic acid  1 mg Oral BID  . furosemide  40 mg Intravenous BID  . insulin aspart  0-15 Units Subcutaneous TID WC  . insulin aspart  0-5 Units Subcutaneous QHS  . levothyroxine  50 mcg Oral QAC breakfast  . metoprolol tartrate  100 mg Oral BID  . pantoprazole  40 mg Oral Daily  . rOPINIRole  0.25 mg Oral QHS  . verapamil  360 mg Oral Daily  . vitamin B-12  500 mcg Oral Daily   Continuous Infusions:   LOS: 7 days    Time spent: 35 minutes.     Elmarie Shiley, MD [facility]   If 7PM-7AM, please contact night-coverage [email or web address]  01/24/2020, 1:53 PM

## 2020-01-25 ENCOUNTER — Ambulatory Visit: Payer: PPO | Admitting: Family Medicine

## 2020-01-25 LAB — RENAL FUNCTION PANEL
Albumin: 2.4 g/dL — ABNORMAL LOW (ref 3.5–5.0)
Anion gap: 11 (ref 5–15)
BUN: 15 mg/dL (ref 8–23)
CO2: 27 mmol/L (ref 22–32)
Calcium: 8.8 mg/dL — ABNORMAL LOW (ref 8.9–10.3)
Chloride: 100 mmol/L (ref 98–111)
Creatinine, Ser: 0.85 mg/dL (ref 0.44–1.00)
GFR calc Af Amer: >60 mL/min (ref >60)
GFR calc non Af Amer: >60 mL/min (ref >60)
Glucose, Bld: 127 mg/dL — ABNORMAL HIGH (ref 70–99)
Phosphorus: 3 mg/dL (ref 2.5–4.6)
Potassium: 4.2 mmol/L (ref 3.5–5.1)
Sodium: 138 mmol/L (ref 135–145)

## 2020-01-25 LAB — GLUCOSE, CAPILLARY
Glucose-Capillary: 123 mg/dL — ABNORMAL HIGH (ref 70–99)
Glucose-Capillary: 162 mg/dL — ABNORMAL HIGH (ref 70–99)
Glucose-Capillary: 153 mg/dL — ABNORMAL HIGH (ref 70–99)
Glucose-Capillary: 154 mg/dL — ABNORMAL HIGH (ref 70–99)

## 2020-01-25 LAB — HEMOGLOBIN AND HEMATOCRIT, BLOOD
HCT: 32.1 % — ABNORMAL LOW (ref 36.0–46.0)
Hemoglobin: 10 g/dL — ABNORMAL LOW (ref 12.0–15.0)

## 2020-01-25 LAB — MAGNESIUM: Magnesium: 1.6 mg/dL — ABNORMAL LOW (ref 1.7–2.4)

## 2020-01-25 MED ORDER — MAGNESIUM SULFATE 2 GM/50ML IV SOLN
2.0000 g | Freq: Once | INTRAVENOUS | Status: AC
  Administered 2020-01-25: 2 g via INTRAVENOUS
  Filled 2020-01-25: qty 50

## 2020-01-25 MED ORDER — VERAPAMIL HCL ER 240 MG PO TBCR
240.0000 mg | EXTENDED_RELEASE_TABLET | Freq: Every day | ORAL | Status: DC
  Filled 2020-01-25: qty 1

## 2020-01-25 NOTE — Progress Notes (Signed)
Occupational Therapy Treatment Patient Details Name: [name] MRN: [id number] DOB: 1945/09/25 Today's Date: 01/25/2020    History of present illness Pt is a 74 y/o female admitted secondary to SOB. Pt found to have acute respiratory failure with hypoxia likely a combination of CHF and Pneumonia. Of note, pt with recent +COVID one month ago. PMH including but not limited to a-fib, DM, HTN and RA.   OT comments  Pt received, asleep and sitting up in recliner. Pt agreeable to practicing shower transfer on this date. Educated pt on fall prevention techniques. Setup bathroom to simulate home environment. Pt required max assist to don bilateral socks while sitting in recliner secondary to decreased flexibility. Pt required supervision for functional transfers using RW for support. No LOB noted. Pt transferred in/out of shower, simulating stepping over side of bath tub and utilizing shower chair/grab bar for support. Pt plans on using her tub transfer bench and grab bars at home for support. No c/o of pain today. Pt able to ambulate to/from bathroom on RA without difficulty. Continuing to recommend [place]. Will continue to follow acutely as able.    Follow Up Recommendations  Home health OT;Supervision/Assistance - 24 hour    Equipment Recommendations  3 in 1 bedside commode    Recommendations for Other Services      Precautions / Restrictions Precautions Precautions: Fall Precaution Comments: monitor SpO2 Restrictions Weight Bearing Restrictions: No       Mobility Bed Mobility               General bed mobility comments: OOB in recliner on entry   Transfers Overall transfer level: Needs assistance Equipment used: Rolling walker (2 wheeled) Transfers: Sit to/from Stand Sit to Stand: Supervision         General transfer comment: good power up and steadying using RW     Balance Overall balance assessment: Needs assistance Sitting-balance support: Feet supported Sitting  balance-Leahy Scale: Good     Standing balance support: Bilateral upper extremity supported Standing balance-Leahy Scale: Fair Standing balance comment: pt requires supervision for safe ambulation to/from bathroom using RW for support                           ADL either performed or assessed with clinical judgement   ADL Overall ADL's : Needs assistance/impaired                     Lower Body Dressing: Maximal assistance;Sitting/lateral leans Lower Body Dressing Details (indicate cue type and reason): pt had difficulty reaching bilateral feet while sitting in recliner         Tub/ Shower Transfer: Supervision/safety;Cueing for safety;Cueing for sequencing;Ambulation;Shower seat;Grab bars;Rolling walker   Functional mobility during ADLs: Supervision/safety;Rolling walker General ADL Comments: pt demonstrated shower transfer with supervision for safety/technique and proper use of RW for support; mimicked home environment     Vision       Perception     Praxis      Cognition Arousal/Alertness: Awake/alert Behavior During Therapy: WFL for tasks assessed/performed Overall Cognitive Status: Within Functional Limits for tasks assessed                                          Exercises Exercises: General Lower Extremity General Exercises - Lower Extremity Ankle Circles/Pumps: AROM;20 reps;Both;Supine   Shoulder Instructions  General Comments ambulated to/from bathroom on RA without difficulty; pt and therapist discussed home environment and suggestions for safely transferring in/out of shower    Pertinent Vitals/ Pain       Pain Assessment: No/denies pain Faces Pain Scale: Hurts a little bit Pain Location: neck Pain Descriptors / Indicators: Discomfort;Other (Comment)(R lateral cervical lean )  Home Living                                          Prior Functioning/Environment              Frequency   Min 2X/week        Progress Toward Goals  OT Goals(current goals can now be found in the care plan section)  Progress towards OT goals: Progressing toward goals  Acute Rehab OT Goals Patient Stated Goal: "to go home" ADL Goals Pt Will Perform Grooming: with modified independence;standing Pt Will Perform Lower Body Dressing: with modified independence;sitting/lateral leans;sit to/from stand Pt Will Transfer to Toilet: with modified independence;ambulating;bedside commode;grab bars Pt Will Perform Tub/Shower Transfer: with supervision;ambulating;shower seat;grab bars;rolling walker Pt/caregiver will Perform Home Exercise Program: Both right and left upper extremity;Increased strength;With theraband;With written HEP provided;With Supervision  Plan Discharge plan remains appropriate    Co-evaluation                 AM-PAC OT "6 Clicks" Daily Activity     Outcome Measure   Help from another person eating meals?: None Help from another person taking care of personal grooming?: None Help from another person toileting, which includes using toliet, bedpan, or urinal?: A Little Help from another person bathing (including washing, rinsing, drying)?: A Little Help from another person to put on and taking off regular upper body clothing?: None Help from another person to put on and taking off regular lower body clothing?: A Little 6 Click Score: 21    End of Session Equipment Utilized During Treatment: Gait belt;Rolling walker  OT Visit Diagnosis: Unsteadiness on feet (R26.81);Muscle weakness (generalized) (M62.81)   Activity Tolerance Patient tolerated treatment well   Patient Left in chair;with call bell/phone within reach   Nurse Communication Mobility status        Time: 1341-1356 OT Time Calculation (min): 15 min  Charges: OT General Charges $OT Visit: 1 Visit OT Treatments $Self Care/Home Management : 8-22 mins  Michel Bickers, OTR/L [facility] Acute Rehab  Services [phone]   Francesca Jewett 01/25/2020, 5:00 PM

## 2020-01-25 NOTE — Progress Notes (Signed)
PROGRESS NOTE    [name]  TIW:[id number] DOB: 1945-12-30 DOA: 01/16/2020 PCP: Alycia Rossetti, MD   Brief Narrative: 74 year old with past medical history significant for SSS/PPM, A. fib, diabetes type 2, chronic respiratory failure on 2 L of oxygen since diagnosis of COVID-19 a month ago, who presented with progressive shortness of breath for 1 week despite increased dose of Lexis by her cardiologist and Levaquin prescribed by her PCP.  She also was complaining of chest pressure.  In the ED heart rate 120, oxygen saturation 95 on 3 L, patient with signs of volume overload with positive JVD and bilateral lower extremity edema, chest x-ray consistent with CHF and moderate left pleural effusion and possible right lung infiltrates.  BNP 526, creatinine 1.6, EKG A. fib with RVR.  Patient received IV Lexis, empiric antibiotics for pneumonia and admitted for acute on chronic systolic heart failure exacerbation and possible pneumonia and A. fib with RVR.  Cardiology consulted.  She was a started on low-dose Lasix.  Renal function got worse. On 5/28 nephrology was consulted.  Lasix was discontinued.  The patient was a started on albumin.  Thoracentesis was performed yielding 1 L transudative and culture negative fluid. No function improved significantly and patient was restarted on IV Laxis by cardiology.    Assessment & Plan:   Principal Problem:   Acute respiratory failure with hypoxia (HCC) Active Problems:   Essential hypertension, benign   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 without organ or systems involvement (HCC)   Atrial fibrillation (HCC)   Hypothyroidism   Tachy-brady syndrome ([place])   Community acquired pneumonia of right lung   Pleural effusion on left  1-Acute on Chronic CHF: -Ejection fraction 55 to 60%, R WMA, paradoxical septal motion due to right ventricular pacemaker and large left pleural effusion. -Continue with IV Laxis 40 mg BID -Urine   output 500 cc overnight.  -1 liter -Weight:182---186--184--181 -Appreciate cardiology assistance.  Plan to discharge home tomorrow.   2- AKI on CKD 3B;  Baseline creatinine 0.9 Peak up to 2.1 during this hospitalization. Creatinine down to 0.7 Monitor on IV Laxis Renal function stable.   3-Possible pneumonia: Pleated ceftriaxone and azithromycin on 6/1 She will need repeated chest x-ray in 4 weeks to ensure resolution  4-Bilateral pleural effusion, moderate on the left and small on the right due to CHF and hypoalbuminemia Underwent IR thoracentesis on 5/28 with removal of 1 L transudative and culture negative fluid  5-Persistent A. fib with RVR On  metoprolol. Cardizem change to verapamil  Might need cardioversion this admission cardiology following and managing  6-chronic respiratory failure due to COVID-19 infection and 2 L of oxygen Stable.   Hypomagnesemia; replete orally.  DM; start low dose lantus. SSI.  Hba1c at 8 Estimated body mass index is 37.87 kg/m as calculated from the following:   Height as of this encounter: 4\' 10"  (1.473 m).   Weight as of this encounter: 82.2 kg.   DVT prophylaxis:  Code Status: Full code Family Communication: Care discussed with patient Disposition Plan:  Status is: Inpatient  Remains inpatient appropriate because:IV treatments appropriate due to intensity of illness or inability to take PO.  Patient is still requiring IV Laxis for volume overload. Home tomorrow   Dispo: The patient is from: Home              Anticipated d/c is to: Home              Anticipated d/c  date is: 1 day              Patient currently is not medically stable to d/c.     Consultants:   Cardiology  Nephrology  Procedures:  -5/28-IR thoracocentesis with removal of 1 L transudative and culture negative fluid   microbiology COVID-19 PCR negative. Pleural fluid culture NGTD. Pleural AFB and Fungal culture pending.   Subjective: Dyspnea  improving, still LE edema  Objective: Vitals:   01/25/20 0002 01/25/20 0452 01/25/20 0807 01/25/20 1111  BP: 127/74 (!) 137/95 (!) 145/102 115/65  Pulse: 79 81  63  Resp:      Temp: 97.9 F (36.6 C) (!) 97.4 F (36.3 C) 98.3 F (36.8 C) 98.2 F (36.8 C)  TempSrc: Oral Oral Oral Oral  SpO2: 96% 98%  97%  Weight:  82.2 kg    Height:       No intake or output data in the 24 hours ending 01/25/20 1332 Filed Weights   01/23/20 0554 01/24/20 0545 01/25/20 0452  Weight: 84.6 kg 83.6 kg 82.2 kg    Examination:  General exam: NAD Respiratory system: CTA Cardiovascular system: S 1, S 2 RRR Gastrointestinal system: BS present, soft, nt Central nervous system: Non focal Extremities: plus two edema  Data Reviewed: I have personally reviewed following labs and imaging studies  CBC: Recent Labs  Lab 01/19/20 0623 01/19/20 1527 01/20/20 0435 01/20/20 0435 01/21/20 0400 01/22/20 0825 01/23/20 0652 01/24/20 0327 01/25/20 0411  WBC 10.3  --  9.6  --   --   --   --   --   --   HGB 8.5*   < > 9.0*   < > 10.1* 10.6* 10.6* 10.3* 10.0*  HCT 27.2*   < > 28.3*   < > 32.6* 34.2* 33.6* 33.4* 32.1*  MCV 95.1  --  95.3  --   --   --   --   --   --   PLT 224  --  197  --   --   --   --   --   --    < > = values in this interval not displayed.   Basic Metabolic Panel: Recent Labs  Lab 01/21/20 0400 01/22/20 0825 01/23/20 0652 01/24/20 0327 01/25/20 0411  NA 139 139 136 136 138  K 4.0 4.3 3.9 3.3* 4.2  CL 103 103 103 100 100  CO2 25 23 22 26 27   GLUCOSE 157* 177* 173* 152* 127*  BUN 18 13 14 12 15   CREATININE 0.92 0.73 0.73 0.78 0.85  CALCIUM 7.6* 8.2* 8.3* 8.4* 8.8*  MG 1.8 1.6* 1.7 1.3* 1.6*  PHOS 1.9* 2.8 2.4* 2.7 3.0   GFR: Estimated Creatinine Clearance: 51.8 mL/min (by C-G formula based on SCr of 0.85 mg/dL). Liver Function Tests: Recent Labs  Lab 01/21/20 0400 01/22/20 0825 01/23/20 0652 01/24/20 0327 01/25/20 0411  ALBUMIN 2.9* 2.6* 2.3* 2.3* 2.4*   No  results for input(s): LIPASE, AMYLASE in the last 168 hours. No results for input(s): AMMONIA in the last 168 hours. Coagulation Profile: No results for input(s): INR, PROTIME in the last 168 hours. Cardiac Enzymes: No results for input(s): CKTOTAL, CKMB, CKMBINDEX, TROPONINI in the last 168 hours. BNP (last 3 results) No results for input(s): PROBNP in the last 8760 hours. HbA1C: No results for input(s): HGBA1C in the last 72 hours. CBG: Recent Labs  Lab 01/24/20 1100 01/24/20 1612 01/24/20 2107 01/25/20 0808 01/25/20 1112  GLUCAP 223* 235* 188*  123* 162*   Lipid Profile: No results for input(s): CHOL, HDL, LDLCALC, TRIG, CHOLHDL, LDLDIRECT in the last 72 hours. Thyroid Function Tests: No results for input(s): TSH, T4TOTAL, FREET4, T3FREE, THYROIDAB in the last 72 hours. Anemia Panel: No results for input(s): VITAMINB12, FOLATE, FERRITIN, TIBC, IRON, RETICCTPCT in the last 72 hours. Sepsis Labs: Recent Labs  Lab 01/19/20 7902  PROCALCITON <0.10    Recent Results (from the past 240 hour(s))  SARS Coronavirus 2 by RT PCR (hospital order, performed in [facility] hospital lab) Nasopharyngeal Nasopharyngeal Swab     Status: None   Collection Time: 01/17/20  5:24 PM   Specimen: Nasopharyngeal Swab  Result Value Ref Range Status   SARS Coronavirus 2 NEGATIVE NEGATIVE Final    Comment: (NOTE) SARS-CoV-2 target nucleic acids are NOT DETECTED. The SARS-CoV-2 RNA is generally detectable in upper and lower respiratory specimens during the acute phase of infection. The lowest concentration of SARS-CoV-2 viral copies this assay can detect is 250 copies / mL. A negative result does not preclude SARS-CoV-2 infection and should not be used as the sole basis for treatment or other patient management decisions.  A negative result may occur with improper specimen collection / handling, submission of specimen other than nasopharyngeal swab, presence of viral mutation(s) within the areas  targeted by this assay, and inadequate number of viral copies (<250 copies / mL). A negative result must be combined with clinical observations, patient history, and epidemiological information. Fact Sheet for Patients:   StrictlyIdeas.no Fact Sheet for Healthcare Providers: BankingDealers.co.za This test is not yet approved or cleared  by the [place] FDA and has been authorized for detection and/or diagnosis of SARS-CoV-2 by FDA under an Emergency Use Authorization (EUA).  This EUA will remain in effect (meaning this test can be used) for the duration of the COVID-19 declaration under Section 564(b)(1) of the Act, 21 U.S.C. section 360bbb-3(b)(1), unless the authorization is terminated or revoked sooner. Performed at [facility], [place] [address] [place], [place] [address]   Gram stain     Status: None   Collection Time: 01/18/20 11:15 AM   Specimen: Lung, Left; Pleural Fluid  Result Value Ref Range Status   Specimen Description PLEURAL FLUID  Final   Special Requests LEFT LUNG  Final   Gram Stain   Final    WBC PRESENT,BOTH PMN AND MONONUCLEAR NO ORGANISMS SEEN CYTOSPIN SMEAR Performed at [facility], [address], [place], [place] [address]    Report Status 01/19/2020 FINAL  Final  Fungus Culture With Stain     Status: None (Preliminary result)   Collection Time: 01/18/20 11:15 AM   Specimen: Lung, Left; Pleural Fluid  Result Value Ref Range Status   Fungus Stain Final report  Final    Comment: (NOTE) Performed At: [facility] [place], [place] [id number] Rush Farmer MD QQ:[id number]    Fungus (Mycology) Culture PENDING  Incomplete   Fungal Source PLEURAL  Final    Comment: FLUID LEFT LUNG   Acid Fast Smear (AFB)     Status: None   Collection Time: 01/18/20 11:15 AM   Specimen: Lung, Left; Pleural Fluid  Result Value Ref Range Status   AFB Specimen Processing Concentration   Final   Acid Fast Smear Negative  Final    Comment: (NOTE) Performed At: [facility] [address] [place], [place] [id number] Rush Farmer MD GY:[id number]    Source (AFB) PLEURAL  Final  Comment: FLUID LEFT LUNG   Culture, body fluid-bottle     Status: None   Collection Time: 01/18/20 11:15 AM   Specimen: Pleura  Result Value Ref Range Status   Specimen Description PLEURAL FLUID  Final   Special Requests LEFT LUNG  Final   Culture   Final    NO GROWTH 5 DAYS Performed at [facility], [address], [place], [place] [address]    Report Status 01/23/2020 FINAL  Final  Fungus Culture Result     Status: None   Collection Time: 01/18/20 11:15 AM  Result Value Ref Range Status   Result 1 Comment  Final    Comment: (NOTE) KOH/Calcofluor preparation:  no fungus observed. Performed At: [facility] [place], [place] [id number] Rush Farmer MD ML:[id number]          Radiology Studies: No results found.      Scheduled Meds: . apixaban  5 mg Oral BID  . atorvastatin  10 mg Oral q1800  . calcium-vitamin D  1 tablet Oral TID  . folic acid  1 mg Oral BID  . furosemide  40 mg Intravenous BID  . insulin aspart  0-15 Units Subcutaneous TID WC  . insulin aspart  0-5 Units Subcutaneous QHS  . insulin glargine  5 Units Subcutaneous Daily  . levothyroxine  50 mcg Oral QAC breakfast  . metoprolol tartrate  100 mg Oral BID  . pantoprazole  40 mg Oral Daily  . rOPINIRole  0.25 mg Oral QHS  . [START ON 01/26/2020] verapamil  240 mg Oral Daily  . vitamin B-12  500 mcg Oral Daily   Continuous Infusions:   LOS: 8 days    Time spent: 35 minutes.     Elmarie Shiley, MD [facility]   If 7PM-7AM, please contact night-coverage [email or web address]  01/25/2020, 1:32 PM

## 2020-01-25 NOTE — Plan of Care (Signed)

## 2020-01-25 NOTE — Progress Notes (Signed)
Physical Therapy Treatment Patient Details Name: [name] MRN: [id number] DOB: May 03, 1946 Today's Date: 01/25/2020    History of Present Illness Pt is a 74 y/o female admitted secondary to SOB. Pt found to have acute respiratory failure with hypoxia likely a combination of CHF and Pneumonia. Of note, pt with recent +COVID one month ago. PMH including but not limited to a-fib, DM, HTN and RA.    PT Comments    Pt asleep in recliner on entry, easily roused and agreeable to therapy today. Edema in LE continues to improve. Instructed pt in ankle pump before lowering her LE to ground. Pt limited in mobility by fatigue and increased oxygen demand in presence of decreased endurance.  Pt is supervision for transfers and min guard for ambulation of 120 feet with RW. Pt requires multiple standing rest breaks with ambulation today despite SaO2 greater than 90%O2. Pt reports "I just feel tired". D/c plans remain appropriate at this time. PT will continue to follow acutely.      Follow Up Recommendations  Home health PT;Supervision/Assistance - 24 hour     Equipment Recommendations  None recommended by PT       Precautions / Restrictions Precautions Precautions: Fall Precaution Comments: monitor SpO2 Restrictions Weight Bearing Restrictions: No    Mobility  Bed Mobility               General bed mobility comments: OOB in recliner on entry   Transfers Overall transfer level: Needs assistance Equipment used: Rolling walker (2 wheeled) Transfers: Sit to/from Stand Sit to Stand: Supervision         General transfer comment: good power up and steadying using RW   Ambulation/Gait Ambulation/Gait assistance: Min guard Gait Distance (Feet): 120 Feet Assistive device: Rolling walker (2 wheeled) Gait Pattern/deviations: Step-through pattern;Decreased stride length Gait velocity: decreased Gait velocity interpretation: <1.8 ft/sec, indicate of risk for recurrent falls General  Gait Details: min guard for safety with slow, steady gait          Balance Overall balance assessment: Needs assistance Sitting-balance support: Feet supported Sitting balance-Leahy Scale: Good     Standing balance support: Bilateral upper extremity supported Standing balance-Leahy Scale: Poor                              Cognition Arousal/Alertness: Awake/alert Behavior During Therapy: WFL for tasks assessed/performed Overall Cognitive Status: Within Functional Limits for tasks assessed                                        Exercises General Exercises - Lower Extremity Ankle Circles/Pumps: AROM;20 reps;Both;Supine    General Comments General comments (skin integrity, edema, etc.): ambulated on 2L O2 via [place] SaO2 >90%O2 with ambulation       Pertinent Vitals/Pain Pain Assessment: Faces Faces Pain Scale: Hurts a little bit Pain Location: neck Pain Descriptors / Indicators: Discomfort;Other (Comment)(R lateral cervical lean )           PT Goals (current goals can now be found in the care plan section) Acute Rehab PT Goals Patient Stated Goal: "to go home" PT Goal Formulation: With patient Time For Goal Achievement: 02/02/20 Potential to Achieve Goals: Good    Frequency    Min 3X/week      PT Plan Current plan remains appropriate       AM-PAC  PT "6 Clicks" Mobility   Outcome Measure  Help needed turning from your back to your side while in a flat bed without using bedrails?: None Help needed moving from lying on your back to sitting on the side of a flat bed without using bedrails?: None Help needed moving to and from a bed to a chair (including a wheelchair)?: None Help needed standing up from a chair using your arms (e.g., wheelchair or bedside chair)?: None Help needed to walk in hospital room?: None Help needed climbing 3-5 steps with a railing? : A Little 6 Click Score: 23    End of Session Equipment Utilized During  Treatment: Oxygen Activity Tolerance: Patient limited by fatigue Patient left: in chair;with call bell/phone within reach Nurse Communication: Mobility status PT Visit Diagnosis: Other abnormalities of gait and mobility (R26.89);Muscle weakness (generalized) (M62.81)     Time: 7482-7078 PT Time Calculation (min) (ACUTE ONLY): 22 min  Charges:  $Gait Training: 8-22 mins                     Jayliah Benett B. Migdalia Dk PT, DPT Acute Rehabilitation Services Pager [phone] Office [phone]    [place] 01/25/2020, 3:14 PM

## 2020-01-25 NOTE — Progress Notes (Addendum)
Progress Note  Patient Name: [name] Date of Encounter: 01/25/2020  [facility] Cardiologist: Quay Burow, MD   Subjective   I/Os are incomplete but weight is down 3 lbs. Afib rates 80-90s. Patient is overall feeling better. She is on her baseline O2 of 2L.   Inpatient Medications    Scheduled Meds: . apixaban  5 mg Oral BID  . atorvastatin  10 mg Oral q1800  . calcium-vitamin D  1 tablet Oral TID  . folic acid  1 mg Oral BID  . furosemide  40 mg Intravenous BID  . insulin aspart  0-15 Units Subcutaneous TID WC  . insulin aspart  0-5 Units Subcutaneous QHS  . insulin glargine  5 Units Subcutaneous Daily  . levothyroxine  50 mcg Oral QAC breakfast  . metoprolol tartrate  100 mg Oral BID  . pantoprazole  40 mg Oral Daily  . rOPINIRole  0.25 mg Oral QHS  . verapamil  360 mg Oral Daily  . vitamin B-12  500 mcg Oral Daily   Continuous Infusions:  PRN Meds: acetaminophen, guaiFENesin-dextromethorphan, ondansetron **OR** ondansetron (ZOFRAN) IV, traMADol   Vital Signs    Vitals:   01/24/20 1935 01/24/20 2145 01/25/20 0002 01/25/20 0452  BP: 128/77 125/73 127/74 (!) 137/95  Pulse: 71 80 79 81  Resp:      Temp: 97.7 F (36.5 C)  97.9 F (36.6 C) (!) 97.4 F (36.3 C)  TempSrc: Oral  Oral Oral  SpO2: 94%  96% 98%  Weight:    82.2 kg  Height:        Intake/Output Summary (Last 24 hours) at 01/25/2020 0737 Last data filed at 01/24/2020 0751 Gross per 24 hour  Intake 50 ml  Output --  Net 50 ml   Last 3 Weights 01/25/2020 01/24/2020 01/23/2020  Weight (lbs) 181 lb 3.1 oz 184 lb 4.8 oz 186 lb 9.6 oz  Weight (kg) 82.19 kg 83.598 kg 84.641 kg      Telemetry    Afib HR 80-90s, PVCs - Personally Reviewed  ECG    No new - Personally Reviewed  Physical Exam   GEN: No acute distress.   Neck: No JVD Cardiac: RRR, no murmurs, rubs, or gallops.  Respiratory: Clear to auscultation bilaterally. GI: Soft, nontender, non-distended  MS: 2+ edema; No  deformity. Neuro:  Nonfocal  Psych: Normal affect   Labs    High Sensitivity Troponin:   Recent Labs  Lab 01/17/20 0356 01/17/20 0707  TROPONINIHS 7 6      Chemistry Recent Labs  Lab 01/23/20 0652 01/24/20 0327 01/25/20 0411  NA 136 136 138  K 3.9 3.3* 4.2  CL 103 100 100  CO2 22 26 27   GLUCOSE 173* 152* 127*  BUN 14 12 15   CREATININE 0.73 0.78 0.85  CALCIUM 8.3* 8.4* 8.8*  ALBUMIN 2.3* 2.3* 2.4*  GFRNONAA >60 >60 >60  GFRAA >60 >60 >60  ANIONGAP 11 10 11      Hematology Recent Labs  Lab 01/19/20 0623 01/19/20 0623 01/19/20 1527 01/19/20 1527 01/20/20 0435 01/21/20 0400 01/23/20 0652 01/24/20 0327 01/25/20 0411  WBC 10.3  --   --   --  9.6  --   --   --   --   RBC 2.86*  --  2.96*  --  2.97*  --   --   --   --   HGB 8.5*   < > 8.5*   < > 9.0*   < > 10.6* 10.3*  10.0*  HCT 27.2*   < > 27.0*   < > 28.3*   < > 33.6* 33.4* 32.1*  MCV 95.1  --   --   --  95.3  --   --   --   --   MCH 29.7  --   --   --  30.3  --   --   --   --   MCHC 31.3  --   --   --  31.8  --   --   --   --   RDW 16.7*  --   --   --  17.0*  --   --   --   --   PLT 224  --   --   --  197  --   --   --   --    < > = values in this interval not displayed.    BNPNo results for input(s): BNP, PROBNP in the last 168 hours.   DDimer No results for input(s): DDIMER in the last 168 hours.   Radiology    No results found.  Cardiac Studies   ECHO 01/18/2020 IMPRESSIONS  1. Left ventricular ejection fraction, by estimation, is 55 to 60%. The  left ventricle has normal function. The left ventricle demonstrates  regional wall motion abnormalities (see scoring diagram/findings for  description). Paradoxical septal motion due  to RV pacemaker. There is mild left ventricular hypertrophy. Left  ventricular diastolic function could not be evaluated.  2. Right ventricular systolic function is normal. The right ventricular  size is normal. There is normal pulmonary artery systolic pressure.  3.  Large pleural effusion in the left lateral region.  4. The mitral valve is abnormal. Trivial mitral valve regurgitation.  5. The aortic valve is tricuspid. Aortic valve regurgitation is mild.  Mild aortic valve sclerosis is present, with no evidence of aortic valve  stenosis.  6. The inferior vena cava is normal in size with greater than 50%  respiratory variability, suggesting right atrial pressure of 3 mmHg.   Comparison(s): Changes from prior study are noted. 03/04/2019: LVEF 60-65%.   Patient Profile     74 y.o. female with a history of persistent atrial fibrillation on Eliquis, s/p PPM placement in 05/2019 due to sinus pauses, hypertension, hyperlipidemia, type 2 diabetes mellitus, tobacco use, and family history of CAD who is being seenfor evaluation of atrial fibrillation. Echocardiogram this admission shows normal LV function and mild aortic insufficiency.  Assessment & Plan    Persistent Afib - In the setting of PNA and recent hospitalization for COVID - Eliquis5mg  BID -She was on Cardizem CD 360mg  daily and Metoprolol 100 mg BIDfor better rate control. Rates were suboptimal and cardizem was switched to verapamil.  - Rates 05-39J  Acute diastolic CHF - BNP up to 673 - Echo showed normal LV function - Patient is on lasix 40mg  BID at home - IV lasix40mg  BID  - I/Os are not complete. Weight is down another 3 lbs - Creatinine 0.85 -Patient still has LLE however could be third spacing  Hypokalemia/hypomagnesemia - supplement as needed  HTN - BB increased as above - cardizem changed to verapamil - Bps reasonable  AKI/CKD stage 3 - creatinine 1.63 on admission>>down to 0.85  PNA - per IM  Pleural effusion - s/p thoracentesis with 1L transudate fluid  For questions or updates, please contact [facility] Please consult [email or web address] for contact info under        Signed,  Cadence Ninfa Meeker, PA-C  01/25/2020, 7:37 AM    History and all data above  reviewed.  Patient examined.  I agree with the findings as above.  The patient is doing better and thinks breathing at baseline on her 2 liters.  She denies chest pain.  She is waiting to ambulate in the hall.  The patient exam reveals [id number]  ,  Lungs: Few basilar crackles and decreased breath sounds  ,  Abd: Positive bowel sounds, no rebound no guarding, Ext Moderate edema reduced  .  All available labs, radiology testing, previous records reviewed. Agree with documented assessment and plan.   Acute diastolic HF:  Her volume appears to be much better and I think she could go home when the primary team is ready on 40 mg bid Lasix.  Her rate is much better and I will reduce the verapamil today.  Home on meds then as listed for rate control.  We will arrange follow up in the next two weeks in our office so that it is in place for discharge.  [name]  12:00 PM  01/25/2020

## 2020-01-26 ENCOUNTER — Other Ambulatory Visit: Payer: Self-pay | Admitting: Family Medicine

## 2020-01-26 LAB — RENAL FUNCTION PANEL
Albumin: 2.2 g/dL — ABNORMAL LOW (ref 3.5–5.0)
Anion gap: 13 (ref 5–15)
BUN: 17 mg/dL (ref 8–23)
CO2: 27 mmol/L (ref 22–32)
Calcium: 8.8 mg/dL — ABNORMAL LOW (ref 8.9–10.3)
Chloride: 98 mmol/L (ref 98–111)
Creatinine, Ser: 1.02 mg/dL — ABNORMAL HIGH (ref 0.44–1.00)
GFR calc Af Amer: >60 mL/min (ref >60)
GFR calc non Af Amer: 54 mL/min — ABNORMAL LOW (ref >60)
Glucose, Bld: 128 mg/dL — ABNORMAL HIGH (ref 70–99)
Phosphorus: 3.6 mg/dL (ref 2.5–4.6)
Potassium: 3.3 mmol/L — ABNORMAL LOW (ref 3.5–5.1)
Sodium: 138 mmol/L (ref 135–145)

## 2020-01-26 LAB — GLUCOSE, CAPILLARY: Glucose-Capillary: 123 mg/dL — ABNORMAL HIGH (ref 70–99)

## 2020-01-26 MED ORDER — METOPROLOL TARTRATE 100 MG PO TABS: 100.0000 mg | ORAL_TABLET | Freq: Two times a day (BID) | ORAL | 3 refills | Status: AC

## 2020-01-26 MED ORDER — POTASSIUM CHLORIDE CRYS ER 20 MEQ PO TBCR
40.0000 meq | EXTENDED_RELEASE_TABLET | Freq: Once | ORAL | Status: AC
  Administered 2020-01-26: 40 meq via ORAL
  Filled 2020-01-26: qty 2

## 2020-01-26 MED ORDER — FUROSEMIDE 40 MG PO TABS
40.0000 mg | ORAL_TABLET | Freq: Two times a day (BID) | ORAL | Status: DC
  Administered 2020-01-26: 40 mg via ORAL
  Filled 2020-01-26: qty 1

## 2020-01-26 MED ORDER — MAGNESIUM OXIDE 400 (241.3 MG) MG PO TABS
200.0000 mg | ORAL_TABLET | Freq: Two times a day (BID) | ORAL | Status: DC
  Administered 2020-01-26: 200 mg via ORAL
  Filled 2020-01-26: qty 1

## 2020-01-26 MED ORDER — VERAPAMIL HCL ER 240 MG PO TBCR
240.0000 mg | EXTENDED_RELEASE_TABLET | Freq: Every day | ORAL | Status: DC
  Administered 2020-01-26: 240 mg via ORAL
  Filled 2020-01-26: qty 1

## 2020-01-26 MED ORDER — VERAPAMIL HCL ER 240 MG PO TBCR: 240.0000 mg | EXTENDED_RELEASE_TABLET | Freq: Every day | ORAL | 3 refills | Status: AC

## 2020-01-26 NOTE — Discharge Summary (Signed)
Physician Discharge Summary  [name] WGN:[id number] DOB: 21-Feb-1946 DOA: 01/16/2020  PCP: Alycia Rossetti, MD  Admit date: 01/16/2020 Discharge date: 01/26/2020  Admitted From: Home  Disposition: Home   Recommendations for Outpatient Follow-up:  1. Follow up with PCP in 1-2 weeks 2. Please obtain BMP/CBC in one week 3. Further adjustment of diuretics. 4. Follow up with cardiology for management of A fib  Home Health: yes Discharge Condition: Stable.  CODE STATUS: Full code Diet recommendation: Heart Healthy   Brief/Interim Summary: 74 year old with past medical history significant for SSS/PPM, A. fib, diabetes type 2, chronic respiratory failure on 2 L of oxygen since diagnosis of COVID-19 a month ago, who presented with progressive shortness of breath for 1 week despite increased dose of Lexis by her cardiologist and Levaquin prescribed by her PCP.  She also was complaining of chest pressure.  In the ED heart rate 120, oxygen saturation 95 on 3 L, patient with signs of volume overload with positive JVD and bilateral lower extremity edema, chest x-ray consistent with CHF and moderate left pleural effusion and possible right lung infiltrates.  BNP 526, creatinine 1.6, EKG A. fib with RVR.  Patient received IV Lexis, empiric antibiotics for pneumonia and admitted for acute on chronic systolic heart failure exacerbation and possible pneumonia and A. fib with RVR.  Cardiology consulted.  She was a started on low-dose Lasix.  Renal function got worse. On 5/28 nephrology was consulted.  Lasix was discontinued.  The patient was a started on albumin.  Thoracentesis was performed yielding 1 L transudative and culture negative fluid. No function improved significantly and patient was restarted on IV Laxis by cardiology.   1-Acute on Chronic CHF: -Ejection fraction 55 to 60%, R WMA, paradoxical septal motion due to right ventricular pacemaker and large left pleural effusion. -Continue  with IV Laxis 40 mg BID -Urine  output 500 cc overnight.  -1 liter -Weight:182---186--184--181 -Appreciate cardiology assistance.  Plan to discharge on home dose lasix.   2- AKI on CKD 3B;  Baseline creatinine 0.9 Peak up to 2.1 during this hospitalization. Creatinine down to 0.7 Renal function stable.   3-Possible pneumonia: Pleated ceftriaxone and azithromycin on 6/1 She will need repeated chest x-ray in 4 weeks to ensure resolution  4-Bilateral pleural effusion, moderate on the left and small on the right due to CHF and hypoalbuminemia Underwent IR thoracentesis on 5/28 with removal of 1 L transudative and culture negative fluid  5-Persistent A. fib with RVR On  metoprolol. Cardizem change to verapamil  cardiology following and managing  6-chronic respiratory failure due to COVID-19 infection and 2 L of oxygen Stable.   Hypomagnesemia; replete orally.  DM; SSI.  Hba1c at 8.  Resume metformin.  Needs further adjustment out patient.   Estimated body mass index is 37.87 kg/m as calculated from the following:   Height as of this encounter: 4\' 10"  (1.473 m).   Weight as of this encounter: 82.2 kg.  Discharge Diagnoses:  Principal Problem:   Acute respiratory failure with hypoxia (HCC) Active Problems:   Essential hypertension, benign   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 without organ or systems involvement (HCC)   Atrial fibrillation (HCC)   Hypothyroidism   Tachy-brady syndrome ([place])   Community acquired pneumonia of right lung   Pleural effusion on left    Discharge Instructions  Discharge Instructions    Diet - low sodium heart healthy   Complete by: As directed    Increase activity  slowly   Complete by: As directed      Allergies as of 01/26/2020      Reactions   Monopril [fosinopril] Cough   Magnesium-containing Compounds Nausea Only, Other (See Comments)   Re: Mag-Ox at higher doses caused a lot of stomach upset (dose  had to be decreased)   Norvasc [amlodipine Besylate] Other (See Comments)   10 mg dose caused LE edema- can tolerate 5 mg dose      Medication List    STOP taking these medications   carvedilol 12.5 MG tablet Commonly known as: COREG   diltiazem 360 MG 24 hr capsule Commonly known as: CARDIZEM CD   levofloxacin 500 MG tablet Commonly known as: LEVAQUIN   losartan 50 MG tablet Commonly known as: COZAAR     TAKE these medications   albuterol 108 (90 Base) MCG/ACT inhaler Commonly known as: VENTOLIN HFA Inhale 2 puffs into the lungs every 4 (four) hours as needed for wheezing or shortness of breath.   atorvastatin 10 MG tablet Commonly known as: LIPITOR TAKE 1 TABLET BY MOUTH EVERY DAY What changed:   how much to take  how to take this  when to take this  additional instructions   benzonatate 200 MG capsule Commonly known as: TESSALON Take 1 capsule (200 mg total) by mouth 3 (three) times daily.   CALCIUM 600 + D PO Take 1 tablet by mouth 2 (two) times daily.   chlorpheniramine-HYDROcodone 10-8 MG/5ML Suer Commonly known as: Tussionex Pennkinetic ER Take 5 mLs by mouth every 12 (twelve) hours as needed. What changed: reasons to take this   Eliquis 5 MG Tabs tablet Generic drug: apixaban TAKE 1 TABLET BY MOUTH TWICE A DAY What changed: how much to take   Enbrel SureClick 50 MG/ML injection Generic drug: etanercept INJECT 50 MG (0.98 ML) UNDER THE SKIN ONCE A WEEK What changed:   how much to take  how to take this  when to take this  additional instructions   estradiol 0.5 MG tablet Commonly known as: ESTRACE Take 0.5 mg by mouth 2 (two) times a day.   fish oil-omega-3 fatty acids 1000 MG capsule Take 1 g by mouth at bedtime.   folic acid 1 MG tablet Commonly known as: FOLVITE TAKE 1 TABLET BY MOUTH TWICE A DAY   furosemide 40 MG tablet Commonly known as: LASIX Take 1 tablet (40 mg total) by mouth 2 (two) times daily.   leflunomide 10 MG  tablet Commonly known as: ARAVA TAKE 1 TABLET BY MOUTH EVERY DAY   levothyroxine 50 MCG tablet Commonly known as: SYNTHROID TAKE 1 TABLET BY MOUTH DAILY BEFORE BREAKFAST What changed: See the new instructions.   magnesium oxide 400 MG tablet Commonly known as: MAG-OX Take 1,200 mg by mouth 2 (two) times daily.   metFORMIN 500 MG tablet Commonly known as: GLUCOPHAGE Take 2 tablets (1,000 mg total) by mouth 2 (two) times daily with a meal.   methocarbamol 500 MG tablet Commonly known as: ROBAXIN Take 1 tablet (500 mg total) by mouth daily as needed for muscle spasms. What changed: when to take this   metoprolol tartrate 100 MG tablet Commonly known as: LOPRESSOR Take 1 tablet (100 mg total) by mouth 2 (two) times daily.   multivitamins ther. w/minerals Tabs tablet Take 1 tablet by mouth daily.   ondansetron 4 MG tablet Commonly known as: Zofran Take 1 tablet (4 mg total) by mouth every 8 (eight) hours as needed for nausea or vomiting.  pantoprazole 40 MG tablet Commonly known as: PROTONIX TAKE 1 TABLET BY MOUTH EVERY DAY   potassium chloride 10 MEQ tablet Commonly known as: KLOR-CON Take 1 tablet (10 mEq total) by mouth daily.   verapamil 240 MG CR tablet Commonly known as: CALAN-SR Take 1 tablet (240 mg total) by mouth daily.   vitamin B-12 500 MCG tablet Commonly known as: CYANOCOBALAMIN Take 500 mcg by mouth daily.   Voltaren 1 % Gel Generic drug: diclofenac sodium Apply 3 g topically daily as needed (to painful sites (in 3 large joints)).      Follow-up Information    [facility] Follow up.   Specialty: [place] Why: For home health services, they will be in contact with you in 1-2 days to set up your home health appointment Contact information: [place] [address] [phone]        Lorretta Harp, MD Follow up on 02/13/2020.   Specialties: Cardiology, Radiology Why: [facility] Follow-up for 02/13/2020 at 10:15AM.  Contact information: [address] [place] [address] [phone]          Allergies  Allergen Reactions  . Monopril [Fosinopril] Cough  . Magnesium-Containing Compounds Nausea Only and Other (See Comments)    Re: Mag-Ox at higher doses caused a lot of stomach upset (dose had to be decreased)  . Norvasc [Amlodipine Besylate] Other (See Comments)    10 mg dose caused LE edema- can tolerate 5 mg dose    Consultations:  Cardiology    Procedures/Studies: DG Chest 1 View  Result Date: 01/18/2020 CLINICAL DATA:  Status post left thoracentesis EXAM: CHEST  1 VIEW COMPARISON:  01/16/2020 FINDINGS: Cardiac shadow is stable. Pacing device is again seen. Bibasilar infiltrative changes are again seen. Reduction in left pleural effusion is noted following thoracentesis. No pneumothorax is seen. IMPRESSION: No evidence of pneumothorax following thoracentesis. Persistent bibasilar opacities are seen. Electronically Signed   By: Inez Catalina M.D.   On: 01/18/2020 11:58   DG Chest 2 View  Result Date: 01/16/2020 CLINICAL DATA:  Cough. EXAM: CHEST - 2 VIEW COMPARISON:  Most recent radiograph 2 days ago 01/14/2020, additional priors. FINDINGS: Left-sided pacemaker remains in place. Similar appearance of moderate size left pleural effusion and adjacent basilar opacity. Worsening patchy opacity in the right mid lower lung zone. Suspected small right pleural effusion. Unchanged heart size and mediastinal contours, left heart border partially obscured by adjacent pleural effusion. No pneumothorax. Chronic change of the left shoulder. IMPRESSION: 1. Similar appearance of moderate size left pleural effusion and adjacent basilar opacity over the past 2 days. 2. Worsening patchy opacity in the right mid and lower lung zone, suspicious for pneumonia. Possible small right pleural effusion. Electronically Signed   By: Keith Rake M.D.   On:  01/16/2020 22:43   DG Chest 2 View  Result Date: 01/14/2020 CLINICAL DATA:  74 year old female with history of cough and dyspnea. EXAM: CHEST - 2 VIEW COMPARISON:  Chest x-ray 12/09/2019. FINDINGS: New moderate to large left pleural effusion with opacity at the left base which may reflect atelectasis and/or consolidation. More ill-defined opacities are also noted throughout the right mid to lower lung, concerning for areas of airspace consolidation. No pneumothorax. No evidence of pulmonary edema. Cardiac silhouette is obscured. Upper mediastinal contours are within normal limits. Aortic atherosclerosis. Left-sided pacemaker device in place with lead tips projecting over the expected location of the right atrium and right ventricle.  Surgical clips project over the right upper quadrant of the abdomen, likely from prior cholecystectomy. IMPRESSION: 1. Interval development of moderate to large left pleural effusion with atelectasis and/or consolidation in the left lung base. 2. Patchy areas of airspace consolidation throughout the right mid to lower lung concerning for multilobar pneumonia. 3. Aortic atherosclerosis. Electronically Signed   By: Vinnie Langton M.D.   On: 01/14/2020 13:33   CT HEAD WO CONTRAST  Result Date: 01/21/2020 CLINICAL DATA:  74 year old female with persistent headache. EXAM: CT HEAD WITHOUT CONTRAST TECHNIQUE: Contiguous axial images were obtained from the base of the skull through the vertex without intravenous contrast. COMPARISON:  None. FINDINGS: Brain: Moderate age-related atrophy and chronic microvascular ischemic changes. There is no acute intracranial hemorrhage. No mass effect or midline shift. No extra-axial fluid collection. Vascular: No hyperdense vessel or unexpected calcification. Skull: Normal. Negative for fracture or focal lesion. Sinuses/Orbits: There is opacification of right frontal and right sphenoid sinuses and several ethmoid air cells. The mastoid air cells are  clear. Other: None IMPRESSION: 1. No acute intracranial pathology. 2. Moderate age-related atrophy and chronic microvascular ischemic changes. 3. Paranasal sinus disease. Electronically Signed   By: Anner Crete M.D.   On: 01/21/2020 19:51   US RENAL  Result Date: 01/18/2020 CLINICAL DATA:  Acute kidney injury. EXAM: RENAL / URINARY TRACT ULTRASOUND COMPLETE COMPARISON:  Abdominal CT 07/11/2018 FINDINGS: Right Kidney: Renal measurements: 9.5 x 5.3 x 5.4 cm = volume: 145 mL. Renal measurements do not include a large exophytic cyst from the upper pole of the right kidney. Upper pole cyst measures 8.0 x 5.2 x 6.4 cm. There is a second cyst in the mid kidney measuring 2.4 x 1.9 x 2.5 cm. Mild thinning of the renal parenchyma. Echogenicity within normal limits. No solid mass or hydronephrosis visualized. Trace perinephric fluid. Left Kidney: Renal measurements: 10.2 x 5.0 x 4.5 cm = volume: 120 mL. Mild thinning of the renal parenchyma. Echogenicity within normal limits. No mass or hydronephrosis visualized. Bladder: Appears normal for degree of bladder distention. Other: Small volume ascites. IMPRESSION: 1. No obstructive uropathy. 2. Mild bilateral renal parenchymal thinning. 3. Right renal cyst, including a large exophytic cyst from the upper pole measuring 8 cm. Electronically Signed   By: Keith Rake M.D.   On: 01/18/2020 19:46   ECHOCARDIOGRAM COMPLETE  Result Date: 01/18/2020    ECHOCARDIOGRAM REPORT   Patient Name:   MERRYN THAKER Date of Exam: 01/18/2020 Medical Rec #:  [id number]     Height:       58.0 in Accession #:    [id number]    Weight:       184.4 lb Date of Birth:  05/11/1946    BSA:          1.759 m Patient Age:    57 years      BP:           123/73 mmHg Patient Gender: F             HR:           110 bpm. Exam Location:  Inpatient Procedure: 2D Echo, Cardiac Doppler and Color Doppler Indications:    CHF  History:        Patient has prior history of Echocardiogram examinations, most                  recent 03/04/2019. Pacemaker, Arrythmias:Atrial Fibrillation,  Signs/Symptoms:Shortness of Breath; Risk Factors:Hypertension,                 Diabetes and Dyslipidemia. LE edema, Covid, PNA.  Sonographer:    Dustin Flock Referring Phys: [place]  1. Left ventricular ejection fraction, by estimation, is 55 to 60%. The left ventricle has normal function. The left ventricle demonstrates regional wall motion abnormalities (see scoring diagram/findings for description). Paradoxical septal motion due to RV pacemaker. There is mild left ventricular hypertrophy. Left ventricular diastolic function could not be evaluated.  2. Right ventricular systolic function is normal. The right ventricular size is normal. There is normal pulmonary artery systolic pressure.  3. Large pleural effusion in the left lateral region.  4. The mitral valve is abnormal. Trivial mitral valve regurgitation.  5. The aortic valve is tricuspid. Aortic valve regurgitation is mild. Mild aortic valve sclerosis is present, with no evidence of aortic valve stenosis.  6. The inferior vena cava is normal in size with greater than 50% respiratory variability, suggesting right atrial pressure of 3 mmHg. Comparison(s): Changes from prior study are noted. 03/04/2019: LVEF 60-65%. FINDINGS  Left Ventricle: Left ventricular ejection fraction, by estimation, is 55 to 60%. The left ventricle has normal function. The left ventricle demonstrates regional wall motion abnormalities. The left ventricular internal cavity size was normal in size. There is mild left ventricular hypertrophy. Abnormal (paradoxical) septal motion, consistent with RV pacemaker. Left ventricular diastolic function could not be evaluated due to atrial fibrillation. Left ventricular diastolic function could not be evaluated. Right Ventricle: The right ventricular size is normal. No increase in right ventricular wall thickness. Right ventricular  systolic function is normal. There is normal pulmonary artery systolic pressure. The tricuspid regurgitant velocity is 2.80 m/s, and  with an assumed right atrial pressure of 3 mmHg, the estimated right ventricular systolic pressure is 91.4 mmHg. Left Atrium: Left atrial size was normal in size. Right Atrium: Right atrial size was normal in size. Pericardium: There is no evidence of pericardial effusion. Mitral Valve: The mitral valve is abnormal. There is mild thickening of the mitral valve leaflet(s). Trivial mitral valve regurgitation. Tricuspid Valve: The tricuspid valve is grossly normal. Tricuspid valve regurgitation is mild. Aortic Valve: The aortic valve is tricuspid. Aortic valve regurgitation is mild. Aortic regurgitation PHT measures 287 msec. Mild aortic valve sclerosis is present, with no evidence of aortic valve stenosis. Pulmonic Valve: The pulmonic valve was normal in structure. Pulmonic valve regurgitation is not visualized. Aorta: The aortic root and ascending aorta are structurally normal, with no evidence of dilitation. Venous: The inferior vena cava is normal in size with greater than 50% respiratory variability, suggesting right atrial pressure of 3 mmHg. IAS/Shunts: No atrial level shunt detected by color flow Doppler. Additional Comments: A pacer wire is visualized. There is a large pleural effusion in the left lateral region.  LEFT VENTRICLE PLAX 2D LVIDd:         3.30 cm  Diastology LVIDs:         2.60 cm  LV e' lateral:   6.42 cm/s LV PW:         1.10 cm  LV E/e' lateral: 12.9 LV IVS:        1.10 cm  LV e' medial:    8.81 cm/s LVOT diam:     2.00 cm  LV E/e' medial:  9.4 LV SV:         51 LV SV Index:   29 LVOT Area:  3.14 cm  RIGHT VENTRICLE RV Basal diam:  2.20 cm RV S prime:     13.80 cm/s TAPSE (M-mode): 2.5 cm LEFT ATRIUM           Index       RIGHT ATRIUM           Index LA diam:      4.10 cm 2.33 cm/m  RA Area:     16.90 cm LA Vol (A4C): 53.1 ml 30.18 ml/m RA Volume:   43.40  ml  24.67 ml/m  AORTIC VALVE LVOT Vmax:   79.60 cm/s LVOT Vmean:  64.200 cm/s LVOT VTI:    0.163 m AI PHT:      287 msec  AORTA Ao Root diam: 2.30 cm MITRAL VALVE               TRICUSPID VALVE MV Area (PHT): 3.99 cm    TR Peak grad:   31.4 mmHg MV Decel Time: 190 msec    TR Vmax:        280.00 cm/s MV E velocity: 83.10 cm/s MV A velocity: 42.40 cm/s  SHUNTS MV E/A ratio:  1.96        Systemic VTI:  0.16 m                            Systemic Diam: 2.00 cm Lyman Bishop MD Electronically signed by Lyman Bishop MD Signature Date/Time: 01/18/2020/10:28:37 AM    Final    IR THORACENTESIS ASP PLEURAL SPACE W/IMG GUIDE  Result Date: 01/18/2020 INDICATION: Patient history of congestive heart failure with shortness of breath presents for therapeutic and diagnostic thoracentesis EXAM: ULTRASOUND GUIDED THERAPEUTIC AND DIAGNOSTIC THORACENTESIS MEDICATIONS: Lidocaine 1% 10 mL COMPLICATIONS: None immediate. PROCEDURE: An ultrasound guided thoracentesis was thoroughly discussed with the patient and questions answered. The benefits, risks, alternatives and complications were also discussed. The patient understands and wishes to proceed with the procedure. Written consent was obtained. Ultrasound was performed to localize and mark an adequate pocket of fluid in the left-sided chest. The area was then prepped and draped in the normal sterile fashion. 1% Lidocaine was used for local anesthesia. Under ultrasound guidance a 6 Fr Safe-T-Centesis catheter was introduced. Thoracentesis was performed. The catheter was removed and a dressing applied. FINDINGS: A total of approximately 1 L of serosanguineous fluid was removed. Samples were sent to the laboratory as requested by the clinical team. IMPRESSION: Successful ultrasound guided left-sided therapeutic and diagnostic thoracentesis yielding 1 L of pleural fluid. Read by: Rushie Nyhan, NP Electronically Signed   By: Aletta Edouard M.D.   On: 01/18/2020 15:45     Subjective: Feeling well, dyspnea at baseline  Discharge Exam: Vitals:   01/26/20 0909 01/26/20 0945  BP: 117/69   Pulse:  (!) 117  Resp:    Temp:    SpO2: 94%      General: Pt is alert, awake, not in acute distress Cardiovascular: RRR, S1/S2 +, no rubs, no gallops Respiratory: CTA bilaterally, no wheezing, no rhonchi Abdominal: Soft, NT, ND, bowel sounds + Extremities: no edema, no cyanosis    The results of significant diagnostics from this hospitalization (including imaging, microbiology, ancillary and laboratory) are listed below for reference.     Microbiology: Recent Results (from the past 240 hour(s))  SARS Coronavirus 2 by RT PCR (hospital order, performed in [facility] hospital lab) Nasopharyngeal Nasopharyngeal Swab     Status: None   Collection Time: 01/17/20  5:24 PM   Specimen: Nasopharyngeal Swab  Result Value Ref Range Status   SARS Coronavirus 2 NEGATIVE NEGATIVE Final    Comment: (NOTE) SARS-CoV-2 target nucleic acids are NOT DETECTED. The SARS-CoV-2 RNA is generally detectable in upper and lower respiratory specimens during the acute phase of infection. The lowest concentration of SARS-CoV-2 viral copies this assay can detect is 250 copies / mL. A negative result does not preclude SARS-CoV-2 infection and should not be used as the sole basis for treatment or other patient management decisions.  A negative result may occur with improper specimen collection / handling, submission of specimen other than nasopharyngeal swab, presence of viral mutation(s) within the areas targeted by this assay, and inadequate number of viral copies (<250 copies / mL). A negative result must be combined with clinical observations, patient history, and epidemiological information. Fact Sheet for Patients:   StrictlyIdeas.no Fact Sheet for Healthcare Providers: BankingDealers.co.za This test is not yet approved or cleared   by the [place] FDA and has been authorized for detection and/or diagnosis of SARS-CoV-2 by FDA under an Emergency Use Authorization (EUA).  This EUA will remain in effect (meaning this test can be used) for the duration of the COVID-19 declaration under Section 564(b)(1) of the Act, 21 U.S.C. section 360bbb-3(b)(1), unless the authorization is terminated or revoked sooner. Performed at [facility], [place] [address] [place], [place] [address]   Gram stain     Status: None   Collection Time: 01/18/20 11:15 AM   Specimen: Lung, Left; Pleural Fluid  Result Value Ref Range Status   Specimen Description PLEURAL FLUID  Final   Special Requests LEFT LUNG  Final   Gram Stain   Final    WBC PRESENT,BOTH PMN AND MONONUCLEAR NO ORGANISMS SEEN CYTOSPIN SMEAR Performed at [facility], [address], [place], [place] [address]    Report Status 01/19/2020 FINAL  Final  Fungus Culture With Stain     Status: None (Preliminary result)   Collection Time: 01/18/20 11:15 AM   Specimen: Lung, Left; Pleural Fluid  Result Value Ref Range Status   Fungus Stain Final report  Final    Comment: (NOTE) Performed At: [facility] [place], [place] [id number] Rush Farmer MD [id number]    Fungus (Mycology) Culture PENDING  Incomplete   Fungal Source PLEURAL  Final    Comment: FLUID LEFT LUNG   Acid Fast Smear (AFB)     Status: None   Collection Time: 01/18/20 11:15 AM   Specimen: Lung, Left; Pleural Fluid  Result Value Ref Range Status   AFB Specimen Processing Concentration  Final   Acid Fast Smear Negative  Final    Comment: (NOTE) Performed At: [facility] [place], [place] [id number] Rush Farmer MD [id number]    Source (AFB) PLEURAL  Final    Comment: FLUID LEFT LUNG   Culture, body fluid-bottle     Status: None   Collection Time: 01/18/20 11:15 AM   Specimen: Pleura  Result Value Ref Range Status   Specimen  Description PLEURAL FLUID  Final   Special Requests LEFT LUNG  Final   Culture   Final    NO GROWTH 5 DAYS Performed at [facility], [address], [place], [place] [address]    Report Status 01/23/2020 FINAL  Final  Fungus Culture Result     Status: None   Collection Time: 01/18/20 11:15 AM  Result Value Ref Range Status  Result 1 Comment  Final    Comment: (NOTE) KOH/Calcofluor preparation:  no fungus observed. Performed At: [facility] [place], [place] [id number] Rush Farmer MD YS:[id number]      Labs: BNP (last 3 results) Recent Labs    12/03/19 1804 12/08/19 0708 01/16/20 2344  BNP 142.5* 147.5* 323.5*   Basic Metabolic Panel: Recent Labs  Lab 01/21/20 0400 01/21/20 0400 01/22/20 0825 01/23/20 0652 01/24/20 0327 01/25/20 0411 01/26/20 0429  NA 139   < > 139 136 136 138 138  K 4.0   < > 4.3 3.9 3.3* 4.2 3.3*  CL 103   < > 103 103 100 100 98  CO2 25   < > 23 22 26 27 27   GLUCOSE 157*   < > 177* 173* 152* 127* 128*  BUN 18   < > 13 14 12 15 17   CREATININE 0.92   < > 0.73 0.73 0.78 0.85 1.02*  CALCIUM 7.6*   < > 8.2* 8.3* 8.4* 8.8* 8.8*  MG 1.8  --  1.6* 1.7 1.3* 1.6*  --   PHOS 1.9*   < > 2.8 2.4* 2.7 3.0 3.6   < > = values in this interval not displayed.   Liver Function Tests: Recent Labs  Lab 01/22/20 0825 01/23/20 5732 01/24/20 0327 01/25/20 0411 01/26/20 0429  ALBUMIN 2.6* 2.3* 2.3* 2.4* 2.2*   No results for input(s): LIPASE, AMYLASE in the last 168 hours. No results for input(s): AMMONIA in the last 168 hours. CBC: Recent Labs  Lab 01/20/20 0435 01/20/20 0435 01/21/20 0400 01/22/20 0825 01/23/20 0652 01/24/20 0327 01/25/20 0411  WBC 9.6  --   --   --   --   --   --   HGB 9.0*   < > 10.1* 10.6* 10.6* 10.3* 10.0*  HCT 28.3*   < > 32.6* 34.2* 33.6* 33.4* 32.1*  MCV 95.3  --   --   --   --   --   --   PLT 197  --   --   --   --   --   --    < > = values in this interval not displayed.   Cardiac  Enzymes: No results for input(s): CKTOTAL, CKMB, CKMBINDEX, TROPONINI in the last 168 hours. BNP: Invalid input(s): POCBNP CBG: Recent Labs  Lab 01/25/20 0808 01/25/20 1112 01/25/20 1603 01/25/20 2151 01/26/20 0807  GLUCAP 123* 162* 153* 154* 123*   D-Dimer No results for input(s): DDIMER in the last 72 hours. Hgb A1c No results for input(s): HGBA1C in the last 72 hours. Lipid Profile No results for input(s): CHOL, HDL, LDLCALC, TRIG, CHOLHDL, LDLDIRECT in the last 72 hours. Thyroid function studies No results for input(s): TSH, T4TOTAL, T3FREE, THYROIDAB in the last 72 hours.  Invalid input(s): FREET3 Anemia work up No results for input(s): VITAMINB12, FOLATE, FERRITIN, TIBC, IRON, RETICCTPCT in the last 72 hours. Urinalysis    Component Value Date/Time   COLORURINE YELLOW 01/18/2020 1715   APPEARANCEUR HAZY (A) 01/18/2020 1715   LABSPEC 1.013 01/18/2020 1715   PHURINE 5.0 01/18/2020 1715   GLUCOSEU NEGATIVE 01/18/2020 1715   HGBUR NEGATIVE 01/18/2020 [place] 01/18/2020 1715   KETONESUR NEGATIVE 01/18/2020 1715   PROTEINUR NEGATIVE 01/18/2020 1715   UROBILINOGEN 1.0 03/16/2014 0628   NITRITE NEGATIVE 01/18/2020 1715   LEUKOCYTESUR LARGE (A) 01/18/2020 1715   Sepsis Labs Invalid input(s): PROCALCITONIN,  WBC,  LACTICIDVEN Microbiology Recent Results (from the past  240 hour(s))  SARS Coronavirus 2 by RT PCR (hospital order, performed in [facility] hospital lab) Nasopharyngeal Nasopharyngeal Swab     Status: None   Collection Time: 01/17/20  5:24 PM   Specimen: Nasopharyngeal Swab  Result Value Ref Range Status   SARS Coronavirus 2 NEGATIVE NEGATIVE Final    Comment: (NOTE) SARS-CoV-2 target nucleic acids are NOT DETECTED. The SARS-CoV-2 RNA is generally detectable in upper and lower respiratory specimens during the acute phase of infection. The lowest concentration of SARS-CoV-2 viral copies this assay can detect is 250 copies / mL. A  negative result does not preclude SARS-CoV-2 infection and should not be used as the sole basis for treatment or other patient management decisions.  A negative result may occur with improper specimen collection / handling, submission of specimen other than nasopharyngeal swab, presence of viral mutation(s) within the areas targeted by this assay, and inadequate number of viral copies (<250 copies / mL). A negative result must be combined with clinical observations, patient history, and epidemiological information. Fact Sheet for Patients:   StrictlyIdeas.no Fact Sheet for Healthcare Providers: BankingDealers.co.za This test is not yet approved or cleared  by the [place] FDA and has been authorized for detection and/or diagnosis of SARS-CoV-2 by FDA under an Emergency Use Authorization (EUA).  This EUA will remain in effect (meaning this test can be used) for the duration of the COVID-19 declaration under Section 564(b)(1) of the Act, 21 U.S.C. section 360bbb-3(b)(1), unless the authorization is terminated or revoked sooner. Performed at [facility], [place] [address] [place], [place] [address]   Gram stain     Status: None   Collection Time: 01/18/20 11:15 AM   Specimen: Lung, Left; Pleural Fluid  Result Value Ref Range Status   Specimen Description PLEURAL FLUID  Final   Special Requests LEFT LUNG  Final   Gram Stain   Final    WBC PRESENT,BOTH PMN AND MONONUCLEAR NO ORGANISMS SEEN CYTOSPIN SMEAR Performed at [facility], [address], [place], [place] [address]    Report Status 01/19/2020 FINAL  Final  Fungus Culture With Stain     Status: None (Preliminary result)   Collection Time: 01/18/20 11:15 AM   Specimen: Lung, Left; Pleural Fluid  Result Value Ref Range Status   Fungus Stain Final report  Final    Comment: (NOTE) Performed At: [facility] [place], [place] [id number] Rush Farmer MD FU:[id number]    Fungus (Mycology) Culture PENDING  Incomplete   Fungal Source PLEURAL  Final    Comment: FLUID LEFT LUNG   Acid Fast Smear (AFB)     Status: None   Collection Time: 01/18/20 11:15 AM   Specimen: Lung, Left; Pleural Fluid  Result Value Ref Range Status   AFB Specimen Processing Concentration  Final   Acid Fast Smear Negative  Final    Comment: (NOTE) Performed At: [facility] [place], [place] [id number] Rush Farmer MD BJ:[id number]    Source (AFB) PLEURAL  Final    Comment: FLUID LEFT LUNG   Culture, body fluid-bottle     Status: None   Collection Time: 01/18/20 11:15 AM   Specimen: Pleura  Result Value Ref Range Status   Specimen Description PLEURAL FLUID  Final   Special Requests LEFT LUNG  Final   Culture   Final    NO GROWTH 5 DAYS Performed at [facility], [address] [place], [place] [address]  Report Status 01/23/2020 FINAL  Final  Fungus Culture Result     Status: None   Collection Time: 01/18/20 11:15 AM  Result Value Ref Range Status   Result 1 Comment  Final    Comment: (NOTE) KOH/Calcofluor preparation:  no fungus observed. Performed At: [facility] [place], [place] [id number] Rush Farmer MD BV:[id number]      Time coordinating discharge: 40 minutes  SIGNED:   Elmarie Shiley, MD  [facility]

## 2020-01-26 NOTE — Progress Notes (Signed)
Dr Cherlyn Cushing rounding note reviewed. Telemetry reviewed, afib rates looked good yesterday, some high rates starting this morning prior to AM meds, verapamil was lowered from 360 to 240mg  today with first lowered dose today. REmains on lopressor 100mg  bid, bp's look fine. Uptrend in Cr, she has been transitioned to oral lasix. No further cardiology recs at this time, we will sign off and arrange outpatient f/u   [facility] will sign off.   Medication Recommendations:  Verapamil 240mg  daily, lopressor 100mg  bid, eliquis 5mg  bid, lasix oral 40mg  bid.  Other recommendations (labs, testing, etc): n/a Follow up as an outpatient:  We will arrange f/u 2-3 weeks.    Carlyle Dolly MD

## 2020-01-26 NOTE — Discharge Instructions (Signed)

## 2020-01-28 ENCOUNTER — Inpatient Hospital Stay (HOSPITAL_COMMUNITY)
Admission: EM | Admit: 2020-01-28 | Discharge: 2020-02-21 | DRG: 871 | Disposition: E | Payer: PPO | Attending: Pulmonary Disease | Admitting: Pulmonary Disease

## 2020-01-28 ENCOUNTER — Emergency Department (HOSPITAL_COMMUNITY): Payer: PPO

## 2020-01-28 ENCOUNTER — Encounter (HOSPITAL_COMMUNITY): Payer: Self-pay | Admitting: Emergency Medicine

## 2020-01-28 DIAGNOSIS — M1711 Unilateral primary osteoarthritis, right knee: Secondary | ICD-10-CM | POA: Diagnosis present

## 2020-01-28 DIAGNOSIS — Z7901 Long term (current) use of anticoagulants: Secondary | ICD-10-CM

## 2020-01-28 DIAGNOSIS — E785 Hyperlipidemia, unspecified: Secondary | ICD-10-CM | POA: Diagnosis present

## 2020-01-28 DIAGNOSIS — I429 Cardiomyopathy, unspecified: Secondary | ICD-10-CM | POA: Diagnosis present

## 2020-01-28 DIAGNOSIS — I495 Sick sinus syndrome: Secondary | ICD-10-CM | POA: Diagnosis present

## 2020-01-28 DIAGNOSIS — Z79890 Hormone replacement therapy (postmenopausal): Secondary | ICD-10-CM

## 2020-01-28 DIAGNOSIS — Z8616 Personal history of COVID-19: Secondary | ICD-10-CM

## 2020-01-28 DIAGNOSIS — J9811 Atelectasis: Secondary | ICD-10-CM | POA: Diagnosis not present

## 2020-01-28 DIAGNOSIS — I110 Hypertensive heart disease with heart failure: Secondary | ICD-10-CM | POA: Diagnosis present

## 2020-01-28 DIAGNOSIS — E119 Type 2 diabetes mellitus without complications: Secondary | ICD-10-CM | POA: Diagnosis present

## 2020-01-28 DIAGNOSIS — M0579 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 without organ or systems involvement: Secondary | ICD-10-CM | POA: Diagnosis present

## 2020-01-28 DIAGNOSIS — L8990 Pressure ulcer of unspecified site, unspecified stage: Secondary | ICD-10-CM | POA: Insufficient documentation

## 2020-01-28 DIAGNOSIS — K760 Fatty (change of) liver, not elsewhere classified: Secondary | ICD-10-CM | POA: Diagnosis present

## 2020-01-28 DIAGNOSIS — J9601 Acute respiratory failure with hypoxia: Secondary | ICD-10-CM | POA: Diagnosis present

## 2020-01-28 DIAGNOSIS — I10 Essential (primary) hypertension: Secondary | ICD-10-CM | POA: Diagnosis not present

## 2020-01-28 DIAGNOSIS — I4891 Unspecified atrial fibrillation: Secondary | ICD-10-CM | POA: Diagnosis present

## 2020-01-28 DIAGNOSIS — R197 Diarrhea, unspecified: Secondary | ICD-10-CM | POA: Diagnosis not present

## 2020-01-28 DIAGNOSIS — I469 Cardiac arrest, cause unspecified: Secondary | ICD-10-CM | POA: Diagnosis present

## 2020-01-28 DIAGNOSIS — R946 Abnormal results of thyroid function studies: Secondary | ICD-10-CM | POA: Diagnosis present

## 2020-01-28 DIAGNOSIS — N179 Acute kidney failure, unspecified: Secondary | ICD-10-CM | POA: Diagnosis present

## 2020-01-28 DIAGNOSIS — R110 Nausea: Secondary | ICD-10-CM | POA: Diagnosis not present

## 2020-01-28 DIAGNOSIS — Z87891 Personal history of nicotine dependence: Secondary | ICD-10-CM

## 2020-01-28 DIAGNOSIS — E872 Acidosis, unspecified: Secondary | ICD-10-CM

## 2020-01-28 DIAGNOSIS — D689 Coagulation defect, unspecified: Secondary | ICD-10-CM | POA: Diagnosis present

## 2020-01-28 DIAGNOSIS — Z66 Do not resuscitate: Secondary | ICD-10-CM | POA: Diagnosis not present

## 2020-01-28 DIAGNOSIS — K7200 Acute and subacute hepatic failure without coma: Secondary | ICD-10-CM | POA: Diagnosis present

## 2020-01-28 DIAGNOSIS — Z8249 Family history of ischemic heart disease and other diseases of the circulatory system: Secondary | ICD-10-CM

## 2020-01-28 DIAGNOSIS — R531 Weakness: Secondary | ICD-10-CM | POA: Diagnosis present

## 2020-01-28 DIAGNOSIS — A419 Sepsis, unspecified organism: Secondary | ICD-10-CM | POA: Diagnosis present

## 2020-01-28 DIAGNOSIS — Z515 Encounter for palliative care: Secondary | ICD-10-CM | POA: Diagnosis not present

## 2020-01-28 DIAGNOSIS — R6521 Severe sepsis with septic shock: Secondary | ICD-10-CM | POA: Diagnosis present

## 2020-01-28 DIAGNOSIS — I5023 Acute on chronic systolic (congestive) heart failure: Secondary | ICD-10-CM | POA: Diagnosis not present

## 2020-01-28 DIAGNOSIS — Z96653 Presence of artificial knee joint, bilateral: Secondary | ICD-10-CM | POA: Diagnosis present

## 2020-01-28 DIAGNOSIS — Z7984 Long term (current) use of oral hypoglycemic drugs: Secondary | ICD-10-CM

## 2020-01-28 DIAGNOSIS — Z888 Allergy status to other drugs, medicaments and biological substances status: Secondary | ICD-10-CM

## 2020-01-28 DIAGNOSIS — Z833 Family history of diabetes mellitus: Secondary | ICD-10-CM

## 2020-01-28 DIAGNOSIS — J189 Pneumonia, unspecified organism: Secondary | ICD-10-CM | POA: Diagnosis present

## 2020-01-28 DIAGNOSIS — E039 Hypothyroidism, unspecified: Secondary | ICD-10-CM | POA: Diagnosis present

## 2020-01-28 DIAGNOSIS — Z79899 Other long term (current) drug therapy: Secondary | ICD-10-CM

## 2020-01-28 DIAGNOSIS — I5033 Acute on chronic diastolic (congestive) heart failure: Secondary | ICD-10-CM | POA: Diagnosis present

## 2020-01-28 DIAGNOSIS — R34 Anuria and oliguria: Secondary | ICD-10-CM | POA: Diagnosis present

## 2020-01-28 DIAGNOSIS — J90 Pleural effusion, not elsewhere classified: Secondary | ICD-10-CM | POA: Diagnosis not present

## 2020-01-28 DIAGNOSIS — Z90710 Acquired absence of both cervix and uterus: Secondary | ICD-10-CM

## 2020-01-28 DIAGNOSIS — R0602 Shortness of breath: Secondary | ICD-10-CM | POA: Diagnosis not present

## 2020-01-28 LAB — I-STAT CHEM 8, ED
BUN: 19 mg/dL (ref 8–23)
Calcium, Ion: 0.93 mmol/L — ABNORMAL LOW (ref 1.15–1.40)
Chloride: 98 mmol/L (ref 98–111)
Creatinine, Ser: 1.3 mg/dL — ABNORMAL HIGH (ref 0.44–1.00)
Glucose, Bld: 185 mg/dL — ABNORMAL HIGH (ref 70–99)
HCT: 31 % — ABNORMAL LOW (ref 36.0–46.0)
Hemoglobin: 10.5 g/dL — ABNORMAL LOW (ref 12.0–15.0)
Potassium: 5.1 mmol/L (ref 3.5–5.1)
Sodium: 137 mmol/L (ref 135–145)
TCO2: 19 mmol/L — ABNORMAL LOW (ref 22–32)

## 2020-01-28 LAB — CBC WITH DIFFERENTIAL/PLATELET
Abs Immature Granulocytes: 0.68 10*3/uL — ABNORMAL HIGH (ref 0.00–0.07)
Basophils Absolute: 0.1 10*3/uL (ref 0.0–0.1)
Basophils Relative: 1 %
Eosinophils Absolute: 0.3 10*3/uL (ref 0.0–0.5)
Eosinophils Relative: 2 %
HCT: 34.4 % — ABNORMAL LOW (ref 36.0–46.0)
Hemoglobin: 10.2 g/dL — ABNORMAL LOW (ref 12.0–15.0)
Immature Granulocytes: 5 %
Lymphocytes Relative: 16 %
Lymphs Abs: 2.3 10*3/uL (ref 0.7–4.0)
MCH: 29.7 pg (ref 26.0–34.0)
MCHC: 29.7 g/dL — ABNORMAL LOW (ref 30.0–36.0)
MCV: 100 fL (ref 80.0–100.0)
Monocytes Absolute: 1.1 10*3/uL — ABNORMAL HIGH (ref 0.1–1.0)
Monocytes Relative: 8 %
Neutro Abs: 9.9 10*3/uL — ABNORMAL HIGH (ref 1.7–7.7)
Neutrophils Relative %: 68 %
Platelets: 271 10*3/uL (ref 150–400)
RBC: 3.44 MIL/uL — ABNORMAL LOW (ref 3.87–5.11)
RDW: 17.9 % — ABNORMAL HIGH (ref 11.5–15.5)
WBC: 14.3 10*3/uL — ABNORMAL HIGH (ref 4.0–10.5)
nRBC: 2 % — ABNORMAL HIGH (ref 0.0–0.2)

## 2020-01-28 LAB — I-STAT VENOUS BLOOD GAS, ED
Acid-base deficit: 5 mmol/L — ABNORMAL HIGH (ref 0.0–2.0)
Bicarbonate: 20.3 mmol/L (ref 20.0–28.0)
Calcium, Ion: 0.91 mmol/L — ABNORMAL LOW (ref 1.15–1.40)
HCT: 31 % — ABNORMAL LOW (ref 36.0–46.0)
Hemoglobin: 10.5 g/dL — ABNORMAL LOW (ref 12.0–15.0)
O2 Saturation: 65 %
Potassium: 5.1 mmol/L (ref 3.5–5.1)
Sodium: 136 mmol/L (ref 135–145)
TCO2: 21 mmol/L — ABNORMAL LOW (ref 22–32)
pCO2, Ven: 38.3 mmHg — ABNORMAL LOW (ref 44.0–60.0)
pH, Ven: 7.332 (ref 7.250–7.430)
pO2, Ven: 36 mmHg (ref 32.0–45.0)

## 2020-01-28 LAB — CBG MONITORING, ED: Glucose-Capillary: 201 mg/dL — ABNORMAL HIGH (ref 70–99)

## 2020-01-28 LAB — ETHANOL: Alcohol, Ethyl (B): <10 mg/dL (ref <10)

## 2020-01-28 LAB — AMMONIA: Ammonia: 82 umol/L — ABNORMAL HIGH (ref 9–35)

## 2020-01-28 MED ORDER — LACTATED RINGERS IV BOLUS: 500.0000 mL | Freq: Once | INTRAVENOUS | Status: DC

## 2020-01-28 MED ORDER — SODIUM CHLORIDE 0.9 % IV BOLUS
1000.0000 mL | Freq: Once | INTRAVENOUS | Status: AC
  Administered 2020-01-28: 1000 mL via INTRAVENOUS

## 2020-01-28 MED ORDER — PIPERACILLIN-TAZOBACTAM 3.375 G IVPB 30 MIN
3.3750 g | Freq: Once | INTRAVENOUS | Status: AC
  Administered 2020-01-28: 3.375 g via INTRAVENOUS
  Filled 2020-01-28: qty 50

## 2020-01-28 MED ORDER — VANCOMYCIN HCL 1750 MG/350ML IV SOLN
1750.0000 mg | Freq: Once | INTRAVENOUS | Status: AC
  Administered 2020-01-29: 1750 mg via INTRAVENOUS
  Filled 2020-01-28: qty 350

## 2020-01-28 NOTE — ED Notes (Signed)
Pt arrives to triage pale, obtunded and slumped over in wheelchair. Pt pressure 70/30, unable to obtain radial pulses. Pt brought back immediately to Tr B. Charge RN made aware. Provider at bedside

## 2020-01-28 NOTE — ED Provider Notes (Signed)
[facility] EMERGENCY DEPARTMENT Provider Note   CSN: [id number] Arrival date & time: 02/14/2020  2236     History Chief Complaint  Patient presents with  . Weakness  . Hypotension    [name] is a 74 y.o. female.  Patient has a complex medical history with recent hospitalization for CHF, pneumonia, pleural effusions, sequela of Covid presenting to the ED with generalized malaise, nonfocal weakness, shortness of breath and somnolence.  Patient hypotensive with EMS in triage.  Patient brought back immediately to trauma B.  Airway and breathing intact.  Patient still has hypotension with maps in the low 60s.  Patient mentating well.  Family called and patient was doing well yesterday but had some shortness of breath and oxygen was increased to 3 L.  Then today about 4 PM patient started feeling poorly with somnolence, generalized weakness and was sent to the ED for further evaluation body mass  The history is provided by the patient, medical records, the EMS personnel and a relative. The history is limited by the condition of the patient.  Illness Location:  Respiratory Quality:  SOB Severity:  Moderate Onset quality:  Gradual Timing:  Constant Progression:  Worsening Chronicity:  Chronic Context:  Recent specialization for pneumonia, CHF, pleural effusions. Relieved by:  Increasing oxygen Worsened by:  Nothing identified Ineffective treatments:  Current medications Associated symptoms: diarrhea, fatigue, nausea, shortness of breath and vomiting   Associated symptoms: no chest pain, no fever, no headaches and no loss of consciousness        Past Medical History:  Diagnosis Date  . Abrasion of skin with infection 05/09/2013  . Acute blood loss anemia 08/05/2012  . Anemia 11/21/2013  . Arthritis    osteoarthritis  . Atrial fibrillation (HCC)    Paroxysmal atrial fibrillation  . Bursitis of right shoulder   . Chronic cholecystitis with calculus 05/04/2013    . 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 Diabetes type 2, controlled ([place]) 05/04/2013  . Essential hypertension, benign 10/14/2012   Essential hypertension  . Fatty liver disease, nonalcoholic 2841   hospitalized, MRCP dx fatty liver  . Gastroenteritis 10/16/2013  . GERD (gastroesophageal reflux disease)   . History of hypertension 08/20/2016  . History of total bilateral knee replacement 06/22/2017   Right= 2013. Left=2003  . Hormone replacement therapy (postmenopausal)   . 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 Hyperlipidemia   . Hypertension   . Hypertensive urgency 07/11/2018  . Hypokalemia 07/11/2018  . Hypomagnesemia 07/21/2016  . Hypothyroidism 03/28/2019  . Malnutrition of moderate degree ([place]) 03/18/2014  . Morbid obesity due to excess calories ([place]) 12/27/2012   Body mass index is 37.16 kg/m.  -  trending up  Lab Results Component Value Date  TSH 1.520 10/15/2013    Contributing to gerd risk/ doe/reviewed the need and the process to achieve and maintain neg calorie balance > defer f/u primary care including intermittently monitoring thyroid status    . Multiple pulmonary nodules 04/04/2014   Followed in Pulmonary clinic/ [facility]/ [place] - See CT chest 03/17/14 > not viz on abd CT from 10/16/13 as did not go high enough on lung slices >>>in tickle file for recall 06/17/14  - Spirometry 04/04/14 wnl sp smoking cessation 02/2014 - 06/26/2014 Right middle lobe 1.0 cm pulmonary nodule is similar over the past 3 months. This remains indeterminate.   - CT 12/25/14  Pulmonary nodules are st  . Nausea &  vomiting 07/11/2018  . Normal cardiac stress test    pt can't remember when or where  . Obesity, unspecified 12/27/2012  . Osteoarthritis of right knee 08/05/2012  . 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 without organ or systems involvement ([place])   . Rheumatoid arthritis(714.0) 09/28/2012  . Sepsis ([place]) 03/16/2014  . Sinus pause 05/17/2019    Sinus pauses  . SIRS (systemic inflammatory response syndrome) ([place]) 07/11/2018  . Syncope and collapse 03/03/2019  . 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 Thyroid nodule 03/05/2016   See CT chest 02/25/16 - u/s thyroid 03/04/16 > Pos L nodules > both Bx 10/01/5186 > BENIGN FOLLICULAR NODULE  . Unspecified essential hypertension 05/04/2013    Patient Active Problem List   Diagnosis Date Noted  . Acute respiratory failure with hypoxia ([place]) 01/17/2020  . Pleural effusion on left   . Community acquired pneumonia of right lung 01/15/2020  . Peripheral edema 12/26/2019  . COVID-19 virus infection 12/03/2019  . Immunocompromised state due to drug therapy 12/03/2019  . Chronic respiratory failure ([place]) 12/03/2019  . 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 Obesity (BMI 30-39.9) 07/04/2019  . Tachy-brady syndrome ([place]) 06/11/2019  . Sinus pause 05/17/2019  . Hypothyroidism 03/28/2019  . Atrial fibrillation ([place])   . Syncope and collapse 03/03/2019  . Diabetes mellitus type 2, uncomplicated ([place]) 41/66/0630  . History of total bilateral knee replacement 06/22/2017  . Primary osteoarthritis of both hands 08/20/2016  . Hypomagnesemia 07/21/2016  . Thyroid nodule 03/05/2016  . Multiple pulmonary nodules 04/04/2014  . Anemia 11/21/2013  . 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 Bursitis of right shoulder   . GERD (gastroesophageal reflux disease)   . 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 without organ or systems involvement ([place])   . Essential hypertension, benign 10/14/2012  . Hyperlipidemia   . Fatty liver disease, nonalcoholic   . Osteoarthritis of right knee 08/05/2012    Past Surgical History:  Procedure Laterality Date  . ABDOMINAL HYSTERECTOMY    . CHOLECYSTECTOMY N/A 05/04/2013   Procedure: LAPAROSCOPIC CHOLECYSTECTOMY WITH INTRAOPERATIVE CHOLANGIOGRAM;  Surgeon: Haywood Lasso, MD;  Location: [facility];  Service: General;  Laterality: N/A;    . ERCP N/A 05/02/2013   Procedure: ENDOSCOPIC RETROGRADE CHOLANGIOPANCREATOGRAPHY (ERCP);  Surgeon: Beryle Beams, MD;  Location: [facility];  Service: Endoscopy;  Laterality: N/A;  . IR THORACENTESIS ASP PLEURAL SPACE W/IMG GUIDE  01/18/2020  . JOINT REPLACEMENT     left knee  . NASAL SINUS SURGERY    . PACEMAKER IMPLANT N/A 06/11/2019   Procedure: PACEMAKER IMPLANT;  Surgeon: Deboraha Sprang, MD;  Location: [place] [facility];  Service: Cardiovascular;  Laterality: N/A;  . SPHINCTEROTOMY  05/02/2013   Procedure: SPHINCTEROTOMY;  Surgeon: Beryle Beams, MD;  Location: [facility];  Service: Endoscopy;;  . TONSILLECTOMY    . TOTAL KNEE ARTHROPLASTY  08/01/2012   Procedure: TOTAL KNEE ARTHROPLASTY;  Surgeon: Meredith Pel, MD;  Location: [place];  Service: Orthopedics;  Laterality: Right;  Right total knee arthroplasty  . TOTAL KNEE ARTHROPLASTY  08/01/2012   RIGHT  KNEE     OB History   No obstetric history on file.     Family History  Problem Relation Age of Onset  . Cancer Mother        breast  . Heart attack Mother   . Stroke Father   . Diabetes Sister   . Diabetes Daughter     Social History  Tobacco Use  . Smoking status: Former Smoker    Packs/day: 0.25    Years: 2.00    Pack years: 0.50    Types: Cigarettes  . Smokeless tobacco: Never Used  Substance Use Topics  . Alcohol use: Yes    Comment: occasional  . Drug use: Never    Home Medications Prior to Admission medications   Medication Sig Start Date End Date Taking? Authorizing Provider  albuterol (VENTOLIN HFA) 108 (90 Base) MCG/ACT inhaler Inhale 2 puffs into the lungs every 4 (four) hours as needed for wheezing or shortness of breath. 12/10/19   Ghimire, Henreitta Leber, MD  atorvastatin (LIPITOR) 10 MG tablet TAKE 1 TABLET BY MOUTH EVERY DAY Patient taking differently: Take 10 mg by mouth daily.  02/06/19   Delsa Grana, PA-C  benzonatate (TESSALON) 200 MG capsule Take 1 capsule (200 mg total) by mouth  3 (three) times daily. 01/15/20   Alycia Rossetti, MD  Calcium Carbonate-Vitamin D (CALCIUM 600 + D PO) Take 1 tablet by mouth 2 (two) times daily.    [provider]  chlorpheniramine-HYDROcodone (TUSSIONEX PENNKINETIC ER) 10-8 MG/5ML SUER Take 5 mLs by mouth every 12 (twelve) hours as needed. Patient taking differently: Take 5 mLs by mouth every 12 (twelve) hours as needed for cough.  01/15/20   Alycia Rossetti, MD  diclofenac sodium (VOLTAREN) 1 % GEL Apply 3 g topically daily as needed (to painful sites (in 3 large joints)).     [provider]  ELIQUIS 5 MG TABS tablet TAKE 1 TABLET BY MOUTH TWICE A DAY Patient taking differently: Take 5 mg by mouth 2 (two) times daily.  11/23/19   Lorretta Harp, MD  estradiol (ESTRACE) 0.5 MG tablet Take 0.5 mg by mouth 2 (two) times a day.    [provider]  etanercept (ENBREL SURECLICK) 50 MG/ML injection INJECT 50 MG (0.98 ML) UNDER THE SKIN ONCE A WEEK Patient taking differently: Inject 50 mg into the skin once a week. Thursdays (0.98 ML) 11/28/19   Bo Merino, MD  fish oil-omega-3 fatty acids 1000 MG capsule Take 1 g by mouth at bedtime.     [provider]  folic acid (FOLVITE) 1 MG tablet TAKE 1 TABLET BY MOUTH TWICE A DAY Patient taking differently: Take 1 mg by mouth 2 (two) times daily.  09/21/19   Alycia Rossetti, MD  furosemide (LASIX) 40 MG tablet Take 1 tablet (40 mg total) by mouth 2 (two) times daily. 01/09/20   Lorretta Harp, MD  leflunomide (ARAVA) 10 MG tablet TAKE 1 TABLET BY MOUTH EVERY DAY Patient taking differently: Take 10 mg by mouth daily.  09/20/19   Bo Merino, MD  levothyroxine (SYNTHROID) 50 MCG tablet TAKE 1 TABLET BY MOUTH DAILY BEFORE BREAKFAST Patient taking differently: Take 50 mcg by mouth daily before breakfast.  01/14/20   Alycia Rossetti, MD  magnesium oxide (MAG-OX) 400 MG tablet Take 1,200 mg by mouth 2 (two) times daily.  11/22/19   [provider]   metFORMIN (GLUCOPHAGE) 500 MG tablet Take 2 tablets (1,000 mg total) by mouth 2 (two) times daily with a meal. 12/27/19   Cuming, Modena Nunnery, MD  methocarbamol (ROBAXIN) 500 MG tablet Take 1 tablet (500 mg total) by mouth daily as needed for muscle spasms. Patient taking differently: Take 500 mg by mouth as needed for muscle spasms.  02/28/19   Ofilia Neas, PA-C  metoprolol tartrate (LOPRESSOR) 100 MG tablet Take 1 tablet (  100 mg total) by mouth 2 (two) times daily. 01/26/20   Regalado, Cassie Freer, MD  Multiple Vitamins-Minerals (MULTIVITAMINS THER. W/MINERALS) TABS Take 1 tablet by mouth daily.    [provider]  ondansetron (ZOFRAN) 4 MG tablet Take 1 tablet (4 mg total) by mouth every 8 (eight) hours as needed for nausea or vomiting. 01/15/20   Alycia Rossetti, MD  pantoprazole (PROTONIX) 40 MG tablet TAKE 1 TABLET BY MOUTH EVERY DAY Patient taking differently: Take 40 mg by mouth daily.  01/15/20   Cabarrus, Modena Nunnery, MD  potassium chloride (KLOR-CON) 10 MEQ tablet Take 1 tablet (10 mEq total) by mouth daily. 10/03/19   Deboraha Sprang, MD  verapamil (CALAN-SR) 240 MG CR tablet Take 1 tablet (240 mg total) by mouth daily. 01/26/20   Regalado, Belkys A, MD  vitamin B-12 (CYANOCOBALAMIN) 500 MCG tablet Take 500 mcg by mouth daily.    [provider]    Allergies    Monopril [fosinopril], Magnesium-containing compounds, and Norvasc [amlodipine besylate]  Review of Systems   Review of Systems  Constitutional: Positive for activity change, appetite change, chills and fatigue. Negative for fever.  Respiratory: Positive for shortness of breath.   Cardiovascular: Negative for chest pain.  Gastrointestinal: Positive for diarrhea, nausea and vomiting.  Neurological: Positive for weakness. Negative for loss of consciousness and headaches.  All other systems reviewed and are negative.   Physical Exam Updated Vital Signs BP (!) 57/41   Pulse 61   Temp 97.6 F (36.4 C)   Resp (!) 24    Ht 4\' 10"  (1.473 m)   Wt 82.2 kg   SpO2 (!) 70%   BMI 37.87 kg/m   Physical Exam Vitals and nursing note reviewed.  Constitutional:      General: She is in acute distress.     Appearance: She is well-developed. She is toxic-appearing.     Comments: Skin cool and clammy.  Patient required 4 person assist to move her from wheelchair to bed.  HENT:     Head: Normocephalic and atraumatic.     Right Ear: External ear normal.     Left Ear: External ear normal.     Nose: Nose normal.     Mouth/Throat:     Mouth: Mucous membranes are moist.  Eyes:     Extraocular Movements: Extraocular movements intact.     Conjunctiva/sclera: Conjunctivae normal.  Cardiovascular:     Rate and Rhythm: Normal rate and regular rhythm.     Heart sounds: No murmur.  Pulmonary:     Effort: Pulmonary effort is normal. No respiratory distress.     Breath sounds: Normal breath sounds.  Abdominal:     Palpations: Abdomen is soft.     Tenderness: There is no abdominal tenderness.  Musculoskeletal:        General: Normal range of motion.     Cervical back: Neck supple.  Skin:    General: Skin is warm and dry.  Neurological:     General: No focal deficit present.     Mental Status: She is alert and oriented to person, place, and time.     GCS: GCS eye subscore is 4. GCS verbal subscore is 5. GCS motor subscore is 6.     Cranial Nerves: Cranial nerves are intact.     Sensory: Sensation is intact.     Motor: Motor function is intact.  Psychiatric:        Mood and Affect: Mood normal.  Behavior: Behavior normal.     ED Results / Procedures / Treatments   Labs (all labs ordered are listed, but only abnormal results are displayed) Labs Reviewed  COMPREHENSIVE METABOLIC PANEL - Abnormal; Notable for the following components:      Result Value   Sodium 133 (*)    Potassium 5.6 (*)    Chloride 91 (*)    CO2 17 (*)    Glucose, Bld 206 (*)    Creatinine, Ser 1.43 (*)    Calcium 8.4 (*)     Total Protein 6.2 (*)    Albumin 2.4 (*)    ALT 292 (*)    Alkaline Phosphatase 265 (*)    Total Bilirubin 1.3 (*)    GFR calc non Af Amer 36 (*)    GFR calc Af Amer 41 (*)    Anion gap 25 (*)    All other components within normal limits  CBC WITH DIFFERENTIAL/PLATELET - Abnormal; Notable for the following components:   WBC 14.3 (*)    RBC 3.44 (*)    Hemoglobin 10.2 (*)    HCT 34.4 (*)    MCHC 29.7 (*)    RDW 17.9 (*)    nRBC 2.0 (*)    Neutro Abs 9.9 (*)    Monocytes Absolute 1.1 (*)    Abs Immature Granulocytes 0.68 (*)    All other components within normal limits  LACTIC ACID, PLASMA - Abnormal; Notable for the following components:   Lactic Acid, Venous >11.0 (*)    All other components within normal limits  AMMONIA - Abnormal; Notable for the following components:   Ammonia 82 (*)    All other components within normal limits  TSH - Abnormal; Notable for the following components:   TSH 7.541 (*)    All other components within normal limits  T4, FREE - Abnormal; Notable for the following components:   Free T4 1.29 (*)    All other components within normal limits  BRAIN NATRIURETIC PEPTIDE - Abnormal; Notable for the following components:   B Natriuretic Peptide 885.1 (*)    All other components within normal limits  CBG MONITORING, ED - Abnormal; Notable for the following components:   Glucose-Capillary 201 (*)    All other components within normal limits  I-STAT CHEM 8, ED - Abnormal; Notable for the following components:   Creatinine, Ser 1.30 (*)    Glucose, Bld 185 (*)    Calcium, Ion 0.93 (*)    TCO2 19 (*)    Hemoglobin 10.5 (*)    HCT 31.0 (*)    All other components within normal limits  I-STAT VENOUS BLOOD GAS, ED - Abnormal; Notable for the following components:   pCO2, Ven 38.3 (*)    TCO2 21 (*)    Acid-base deficit 5.0 (*)    Calcium, Ion 0.91 (*)    HCT 31.0 (*)    Hemoglobin 10.5 (*)    All other components within normal limits  CULTURE, BLOOD  (ROUTINE X 2)  CULTURE, BLOOD (ROUTINE X 2)  URINE CULTURE  ETHANOL  URINALYSIS, COMPLETE (UACMP) WITH MICROSCOPIC  LACTIC ACID, PLASMA  LACTIC ACID, PLASMA  BLOOD GAS, VENOUS  RAPID URINE DRUG SCREEN, HOSP PERFORMED  CBG MONITORING, ED    EKG EKG Interpretation  Date/Time:  Monday January 28 2020 22:43:10 EDT Ventricular Rate:  82 PR Interval:    QRS Duration: 136 QT Interval:  421 QTC Calculation: 492 R Axis:   -70 Text Interpretation: Ventricular-paced  rhythm No further analysis attempted due to paced rhythm Abnormal ECG Confirmed by Carmin Muskrat [phone]) on 02/02/2020 11:27:05 PM   Radiology DG Chest 1 View  Result Date: 02/03/2020 CLINICAL DATA:  74 year old female with shortness of breath. EXAM: CHEST  1 VIEW COMPARISON:  Portable chest 01/18/2020 and earlier. FINDINGS: Portable AP semi upright view at 2302 hours. Persistent veiling opacity in the left lower lung, not significantly changed from last month. Mildly improved right lung base ventilation although residual streaky opacity just above the right hemidiaphragm. Stable cardiac size and mediastinal contours. Stable left chest dual lead pacemaker. Visualized tracheal air column is within normal limits. No pneumothorax or pulmonary edema. Paucity of bowel gas in the upper abdomen. Stable cholecystectomy clips. No acute osseous abnormality identified. IMPRESSION: 1. Left pleural effusion not significantly changed since 01/18/2020. 2. Right lung base ventilation appears mildly improved although with residual opacity which could be atelectasis or infection. Electronically Signed   By: Genevie Ann M.D.   On: 01/22/2020 23:11    Procedures Procedures (including critical care time)  Medications Ordered in ED Medications  vancomycin (VANCOREADY) IVPB 1750 mg/350 mL (has no administration in time range)  piperacillin-tazobactam (ZOSYN) IVPB 3.375 g (0 g Intravenous Stopped 01/29/20 0016)  sodium chloride 0.9 % bolus 1,000 mL (1,000 mLs  Intravenous New Bag/Given 02/13/2020 2347)  lactated ringers bolus 1,000 mL (1,000 mLs Intravenous New Bag/Given 01/29/20 0017)    ED Course  I have reviewed the triage vital signs and the nursing notes.  Pertinent labs & imaging results that were available during my care of the patient were reviewed by me and considered in my medical decision making (see chart for details).  Clinical Course as of Jan 29 227  Mon Jan 28, 2020  2330 Patient's grandson Patsy [place] called at [phone]  He states that patient was doing pretty well yesterday on her 2 L of oxygen however she felt short of breath and increased to 3 L.  Then today at 4 PM she started feeling tired, sleepy and weak.  She had trouble holding her head up and ultimately sent her to the ED.   [AL]  Tue Jan 29, 2020  0007 B Natriuretic Peptide(!): 885.1 [AL]    Clinical Course User Index [AL] Delma Post, MD   MDM Rules/Calculators/A&P                      Differential diagnosis: Sepsis, respiratory failure, pneumonia, nausea vomiting, dehydration  ED physician interpretation of imaging: Chest x-ray without pulmonary edema, hemopneumothorax.  Stable left pleural effusion. ED physician interpretation of EKG: No STEMI.  Dual-pacing. ED physician interpretation of labs: Severe lactic acidosis, leukocytosis.  BNP elevated above recent level at hospitalization.  Inconsistent with patient's chest x-ray pulmonary edema.  May not be related to CHF.  MDM: Patient is a 74 year old female with a complex medical history presenting to the ED with hypotension, generalized weakness, increasing shortness of breath with severe L abnormalities including leukocytosis, lactic acidosis of greater than 11 requiring sepsis work-up and treatment including broad-spectrum antibiotics, IV fluids and admission to the ICU.  Patient is hypotensive and nearly febrile with maps in the low 60s, temperature 100.1.  Patients other vital signs stable.  Patient appears to  have acute worsening of her likely pneumonia, however other sources of infection cannot be ruled out.  Patient has CHF and recently had large pleural effusion requiring IR drainage, however no signs of pulmonary edema on chest x-ray.  Based  on patient's severe illness 30 cc/kg is indicated.  2 L ordered as well as fluids with patient's antibiotics fulfill this requirement for ideal body weight.  Fluid still running at time of handoff.  Pt care was handed off to oncoming provider at 1235am.  Complete history and physical and current plan have been communicated.  Please refer to their note for the remainder of ED care and ultimate disposition.  Final Clinical Impression(s) / ED Diagnoses Final diagnoses:  Lactic acidosis  Sepsis, due to unspecified organism, unspecified whether acute organ dysfunction present [facility])    Rx / DC Orders ED Discharge Orders    None       Delma Post, MD 01/29/20 1836    Carmin Muskrat, MD 01/29/20 1723

## 2020-01-28 NOTE — ED Triage Notes (Signed)
Pt from home w/ daughter, C/O weakness and SOB.  Upon arrival, fire was helping her out of the bathroom w/ her walker.  Weakness started this morning, vomiting and diaharria this afternoon.  A/O X4 w/ EMS.  99% on 3 Lt (home O2).  Attempted to get pt into wheelchair however her blood pressure dropped to 70's.

## 2020-01-29 ENCOUNTER — Inpatient Hospital Stay (HOSPITAL_COMMUNITY): Payer: PPO

## 2020-01-29 ENCOUNTER — Emergency Department (HOSPITAL_COMMUNITY): Payer: PPO

## 2020-01-29 ENCOUNTER — Telehealth: Payer: Self-pay | Admitting: *Deleted

## 2020-01-29 DIAGNOSIS — R6521 Severe sepsis with septic shock: Secondary | ICD-10-CM | POA: Diagnosis present

## 2020-01-29 DIAGNOSIS — Z8616 Personal history of COVID-19: Secondary | ICD-10-CM | POA: Diagnosis not present

## 2020-01-29 DIAGNOSIS — L8990 Pressure ulcer of unspecified site, unspecified stage: Secondary | ICD-10-CM | POA: Insufficient documentation

## 2020-01-29 DIAGNOSIS — Z515 Encounter for palliative care: Secondary | ICD-10-CM

## 2020-01-29 DIAGNOSIS — I4891 Unspecified atrial fibrillation: Secondary | ICD-10-CM | POA: Diagnosis present

## 2020-01-29 DIAGNOSIS — I429 Cardiomyopathy, unspecified: Secondary | ICD-10-CM | POA: Diagnosis present

## 2020-01-29 DIAGNOSIS — E872 Acidosis: Secondary | ICD-10-CM | POA: Diagnosis present

## 2020-01-29 DIAGNOSIS — E119 Type 2 diabetes mellitus without complications: Secondary | ICD-10-CM | POA: Diagnosis present

## 2020-01-29 DIAGNOSIS — I110 Hypertensive heart disease with heart failure: Secondary | ICD-10-CM | POA: Diagnosis present

## 2020-01-29 DIAGNOSIS — J189 Pneumonia, unspecified organism: Secondary | ICD-10-CM

## 2020-01-29 DIAGNOSIS — R531 Weakness: Secondary | ICD-10-CM | POA: Diagnosis present

## 2020-01-29 DIAGNOSIS — R946 Abnormal results of thyroid function studies: Secondary | ICD-10-CM | POA: Diagnosis present

## 2020-01-29 DIAGNOSIS — I5033 Acute on chronic diastolic (congestive) heart failure: Secondary | ICD-10-CM | POA: Diagnosis present

## 2020-01-29 DIAGNOSIS — I5023 Acute on chronic systolic (congestive) heart failure: Secondary | ICD-10-CM | POA: Diagnosis not present

## 2020-01-29 DIAGNOSIS — I469 Cardiac arrest, cause unspecified: Secondary | ICD-10-CM | POA: Diagnosis present

## 2020-01-29 DIAGNOSIS — K7200 Acute and subacute hepatic failure without coma: Secondary | ICD-10-CM | POA: Diagnosis present

## 2020-01-29 DIAGNOSIS — M0579 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 without organ or systems involvement: Secondary | ICD-10-CM | POA: Diagnosis present

## 2020-01-29 DIAGNOSIS — A419 Sepsis, unspecified organism: Secondary | ICD-10-CM | POA: Diagnosis present

## 2020-01-29 DIAGNOSIS — K760 Fatty (change of) liver, not elsewhere classified: Secondary | ICD-10-CM | POA: Diagnosis present

## 2020-01-29 DIAGNOSIS — D689 Coagulation defect, unspecified: Secondary | ICD-10-CM | POA: Diagnosis present

## 2020-01-29 DIAGNOSIS — J9601 Acute respiratory failure with hypoxia: Secondary | ICD-10-CM | POA: Diagnosis present

## 2020-01-29 DIAGNOSIS — E039 Hypothyroidism, unspecified: Secondary | ICD-10-CM | POA: Diagnosis present

## 2020-01-29 DIAGNOSIS — Z66 Do not resuscitate: Secondary | ICD-10-CM | POA: Diagnosis not present

## 2020-01-29 DIAGNOSIS — E785 Hyperlipidemia, unspecified: Secondary | ICD-10-CM | POA: Diagnosis present

## 2020-01-29 DIAGNOSIS — I495 Sick sinus syndrome: Secondary | ICD-10-CM | POA: Diagnosis present

## 2020-01-29 DIAGNOSIS — N179 Acute kidney failure, unspecified: Secondary | ICD-10-CM | POA: Diagnosis present

## 2020-01-29 DIAGNOSIS — R34 Anuria and oliguria: Secondary | ICD-10-CM | POA: Diagnosis present

## 2020-01-29 LAB — I-STAT ARTERIAL BLOOD GAS, ED
Acid-base deficit: 13 mmol/L — ABNORMAL HIGH (ref 0.0–2.0)
Acid-base deficit: 15 mmol/L — ABNORMAL HIGH (ref 0.0–2.0)
Bicarbonate: 13.8 mmol/L — ABNORMAL LOW (ref 20.0–28.0)
Bicarbonate: 13.6 mmol/L — ABNORMAL LOW (ref 20.0–28.0)
Calcium, Ion: 0.83 mmol/L — CL (ref 1.15–1.40)
Calcium, Ion: 0.83 mmol/L — CL (ref 1.15–1.40)
HCT: 27 % — ABNORMAL LOW (ref 36.0–46.0)
HCT: 25 % — ABNORMAL LOW (ref 36.0–46.0)
Hemoglobin: 9.2 g/dL — ABNORMAL LOW (ref 12.0–15.0)
Hemoglobin: 8.5 g/dL — ABNORMAL LOW (ref 12.0–15.0)
O2 Saturation: 100 %
O2 Saturation: 98 %
Patient temperature: 98.6
Patient temperature: 97.5
Potassium: 5.9 mmol/L — ABNORMAL HIGH (ref 3.5–5.1)
Potassium: 5 mmol/L (ref 3.5–5.1)
Sodium: 140 mmol/L (ref 135–145)
Sodium: 140 mmol/L (ref 135–145)
TCO2: 15 mmol/L — ABNORMAL LOW (ref 22–32)
TCO2: 15 mmol/L — ABNORMAL LOW (ref 22–32)
pCO2 arterial: 35 mmHg (ref 32.0–48.0)
pCO2 arterial: 40.7 mmHg (ref 32.0–48.0)
pH, Arterial: 7.203 — ABNORMAL LOW (ref 7.350–7.450)
pH, Arterial: 7.13 — CL (ref 7.350–7.450)
pO2, Arterial: 253 mmHg — ABNORMAL HIGH (ref 83.0–108.0)
pO2, Arterial: 131 mmHg — ABNORMAL HIGH (ref 83.0–108.0)

## 2020-01-29 LAB — CBC
HCT: 30.8 % — ABNORMAL LOW (ref 36.0–46.0)
Hemoglobin: 8.8 g/dL — ABNORMAL LOW (ref 12.0–15.0)
MCH: 30.6 pg (ref 26.0–34.0)
MCHC: 28.6 g/dL — ABNORMAL LOW (ref 30.0–36.0)
MCV: 106.9 fL — ABNORMAL HIGH (ref 80.0–100.0)
Platelets: UNDETERMINED 10*3/uL (ref 150–400)
RBC: 2.88 MIL/uL — ABNORMAL LOW (ref 3.87–5.11)
RDW: 17.7 % — ABNORMAL HIGH (ref 11.5–15.5)
WBC: 15.1 10*3/uL — ABNORMAL HIGH (ref 4.0–10.5)
nRBC: 6 % — ABNORMAL HIGH (ref 0.0–0.2)

## 2020-01-29 LAB — COMPREHENSIVE METABOLIC PANEL
ALT: 292 U/L — ABNORMAL HIGH (ref 0–44)
ALT: 1434 U/L — ABNORMAL HIGH (ref 0–44)
AST: 283 U/L — ABNORMAL HIGH (ref 15–41)
AST: 2646 U/L — ABNORMAL HIGH (ref 15–41)
Albumin: 2.4 g/dL — ABNORMAL LOW (ref 3.5–5.0)
Albumin: 1.6 g/dL — ABNORMAL LOW (ref 3.5–5.0)
Alkaline Phosphatase: 265 U/L — ABNORMAL HIGH (ref 38–126)
Alkaline Phosphatase: 246 U/L — ABNORMAL HIGH (ref 38–126)
Anion gap: 25 — ABNORMAL HIGH (ref 5–15)
Anion gap: 31 — ABNORMAL HIGH (ref 5–15)
BUN: 19 mg/dL (ref 8–23)
BUN: 16 mg/dL (ref 8–23)
CO2: 17 mmol/L — ABNORMAL LOW (ref 22–32)
CO2: 13 mmol/L — ABNORMAL LOW (ref 22–32)
Calcium: 8.4 mg/dL — ABNORMAL LOW (ref 8.9–10.3)
Calcium: 7.5 mg/dL — ABNORMAL LOW (ref 8.9–10.3)
Chloride: 91 mmol/L — ABNORMAL LOW (ref 98–111)
Chloride: 100 mmol/L (ref 98–111)
Creatinine, Ser: 1.43 mg/dL — ABNORMAL HIGH (ref 0.44–1.00)
Creatinine, Ser: 1.56 mg/dL — ABNORMAL HIGH (ref 0.44–1.00)
GFR calc Af Amer: 41 mL/min — ABNORMAL LOW (ref >60)
GFR calc Af Amer: 37 mL/min — ABNORMAL LOW (ref >60)
GFR calc non Af Amer: 36 mL/min — ABNORMAL LOW (ref >60)
GFR calc non Af Amer: 32 mL/min — ABNORMAL LOW (ref >60)
Glucose, Bld: 206 mg/dL — ABNORMAL HIGH (ref 70–99)
Glucose, Bld: 72 mg/dL (ref 70–99)
Potassium: 5.6 mmol/L — ABNORMAL HIGH (ref 3.5–5.1)
Potassium: 5.4 mmol/L — ABNORMAL HIGH (ref 3.5–5.1)
Sodium: 133 mmol/L — ABNORMAL LOW (ref 135–145)
Sodium: 144 mmol/L (ref 135–145)
Total Bilirubin: 1.3 mg/dL — ABNORMAL HIGH (ref 0.3–1.2)
Total Bilirubin: 1.5 mg/dL — ABNORMAL HIGH (ref 0.3–1.2)
Total Protein: 6.2 g/dL — ABNORMAL LOW (ref 6.5–8.1)
Total Protein: 4.7 g/dL — ABNORMAL LOW (ref 6.5–8.1)

## 2020-01-29 LAB — GLUCOSE, CAPILLARY
Glucose-Capillary: 66 mg/dL — ABNORMAL LOW (ref 70–99)
Glucose-Capillary: 61 mg/dL — ABNORMAL LOW (ref 70–99)
Glucose-Capillary: 138 mg/dL — ABNORMAL HIGH (ref 70–99)
Glucose-Capillary: 107 mg/dL — ABNORMAL HIGH (ref 70–99)
Glucose-Capillary: 74 mg/dL (ref 70–99)
Glucose-Capillary: 73 mg/dL (ref 70–99)

## 2020-01-29 LAB — POCT I-STAT 7, (LYTES, BLD GAS, ICA,H+H)
Acid-base deficit: 21 mmol/L — ABNORMAL HIGH (ref 0.0–2.0)
Acid-base deficit: 21 mmol/L — ABNORMAL HIGH (ref 0.0–2.0)
Bicarbonate: 8.7 mmol/L — ABNORMAL LOW (ref 20.0–28.0)
Bicarbonate: 8.5 mmol/L — ABNORMAL LOW (ref 20.0–28.0)
Calcium, Ion: 0.86 mmol/L — CL (ref 1.15–1.40)
Calcium, Ion: 0.94 mmol/L — ABNORMAL LOW (ref 1.15–1.40)
HCT: 30 % — ABNORMAL LOW (ref 36.0–46.0)
HCT: 26 % — ABNORMAL LOW (ref 36.0–46.0)
Hemoglobin: 10.2 g/dL — ABNORMAL LOW (ref 12.0–15.0)
Hemoglobin: 8.8 g/dL — ABNORMAL LOW (ref 12.0–15.0)
O2 Saturation: 99 %
O2 Saturation: 94 %
Patient temperature: 34.8
Patient temperature: 34.6
Potassium: 4.5 mmol/L (ref 3.5–5.1)
Potassium: 4.6 mmol/L (ref 3.5–5.1)
Sodium: 140 mmol/L (ref 135–145)
Sodium: 143 mmol/L (ref 135–145)
TCO2: 10 mmol/L — ABNORMAL LOW (ref 22–32)
TCO2: 9 mmol/L — ABNORMAL LOW (ref 22–32)
pCO2 arterial: 29.7 mmHg — ABNORMAL LOW (ref 32.0–48.0)
pCO2 arterial: 28.1 mmHg — ABNORMAL LOW (ref 32.0–48.0)
pH, Arterial: 7.06 — CL (ref 7.350–7.450)
pH, Arterial: 7.074 — CL (ref 7.350–7.450)
pO2, Arterial: 193 mmHg — ABNORMAL HIGH (ref 83.0–108.0)
pO2, Arterial: 88 mmHg (ref 83.0–108.0)

## 2020-01-29 LAB — LACTIC ACID, PLASMA
Lactic Acid, Venous: >11 mmol/L (ref 0.5–1.9)
Lactic Acid, Venous: >11 mmol/L (ref 0.5–1.9)
Lactic Acid, Venous: >11 mmol/L (ref 0.5–1.9)
Lactic Acid, Venous: >11 mmol/L (ref 0.5–1.9)

## 2020-01-29 LAB — PROTIME-INR
INR: 6.4 (ref 0.8–1.2)
INR: 9.6 (ref 0.8–1.2)
Prothrombin Time: 54.3 seconds — ABNORMAL HIGH (ref 11.4–15.2)
Prothrombin Time: 74.8 seconds — ABNORMAL HIGH (ref 11.4–15.2)

## 2020-01-29 LAB — T4, FREE: Free T4: 1.29 ng/dL — ABNORMAL HIGH (ref 0.61–1.12)

## 2020-01-29 LAB — TROPONIN I (HIGH SENSITIVITY)
Troponin I (High Sensitivity): 24 ng/L — ABNORMAL HIGH (ref <18)
Troponin I (High Sensitivity): 47 ng/L — ABNORMAL HIGH (ref <18)
Troponin I (High Sensitivity): 63 ng/L — ABNORMAL HIGH (ref <18)

## 2020-01-29 LAB — D-DIMER, QUANTITATIVE: D-Dimer, Quant: >20 ug/mL-FEU — ABNORMAL HIGH (ref 0.00–0.50)

## 2020-01-29 LAB — FIBRINOGEN: Fibrinogen: 543 mg/dL — ABNORMAL HIGH (ref 210–475)

## 2020-01-29 LAB — BRAIN NATRIURETIC PEPTIDE: B Natriuretic Peptide: 885.1 pg/mL — ABNORMAL HIGH (ref 0.0–100.0)

## 2020-01-29 LAB — ECHOCARDIOGRAM COMPLETE
Height: 58 in
Weight: 2899.49 oz

## 2020-01-29 LAB — TYPE AND SCREEN
ABO/RH(D): A POS
Antibody Screen: NEGATIVE

## 2020-01-29 LAB — MRSA PCR SCREENING: MRSA by PCR: NEGATIVE

## 2020-01-29 LAB — TSH: TSH: 7.541 u[IU]/mL — ABNORMAL HIGH (ref 0.350–4.500)

## 2020-01-29 LAB — PROCALCITONIN: Procalcitonin: 0.61 ng/mL

## 2020-01-29 MED ORDER — EPINEPHRINE HCL 5 MG/250ML IV SOLN IN NS
0.5000 ug/min | INTRAVENOUS | Status: DC
  Administered 2020-01-29 (×2): 20 ug/min via INTRAVENOUS
  Administered 2020-01-29: 18 ug/min via INTRAVENOUS
  Administered 2020-01-29: 24 ug/min via INTRAVENOUS
  Filled 2020-01-29 (×3): qty 250

## 2020-01-29 MED ORDER — CALCIUM GLUCONATE-NACL 1-0.675 GM/50ML-% IV SOLN
1.0000 g | Freq: Once | INTRAVENOUS | Status: AC
  Administered 2020-01-29: 1000 mg via INTRAVENOUS
  Filled 2020-01-29: qty 50

## 2020-01-29 MED ORDER — ATORVASTATIN CALCIUM 10 MG PO TABS: 10.0000 mg | ORAL_TABLET | Freq: Every day | ORAL | Status: DC

## 2020-01-29 MED ORDER — EPINEPHRINE PF 1 MG/ML IJ SOLN: 0.5000 ug/min | INTRAVENOUS | Status: DC

## 2020-01-29 MED ORDER — VANCOMYCIN HCL 1250 MG/250ML IV SOLN
1250.0000 mg | INTRAVENOUS | Status: DC
  Filled 2020-01-29: qty 250

## 2020-01-29 MED ORDER — SODIUM CHLORIDE 0.9 % IV SOLN
1.0000 g | Freq: Two times a day (BID) | INTRAVENOUS | Status: DC
  Administered 2020-01-29 (×2): 1 g via INTRAVENOUS
  Filled 2020-01-29 (×3): qty 1

## 2020-01-29 MED ORDER — GLUCAGON HCL RDNA (DIAGNOSTIC) 1 MG IJ SOLR
5.0000 mg | Freq: Once | INTRAVENOUS | Status: AC
  Administered 2020-01-29: 5 mg via INTRAVENOUS
  Filled 2020-01-29: qty 5

## 2020-01-29 MED ORDER — HEPARIN SODIUM (PORCINE) 5000 UNIT/ML IJ SOLN: 5000.0000 [IU] | Freq: Three times a day (TID) | INTRAMUSCULAR | Status: DC

## 2020-01-29 MED ORDER — SODIUM BICARBONATE-DEXTROSE 150-5 MEQ/L-% IV SOLN
150.0000 meq | INTRAVENOUS | Status: DC
  Administered 2020-01-29: 50 meq via INTRAVENOUS
  Filled 2020-01-29: qty 1000

## 2020-01-29 MED ORDER — PANTOPRAZOLE SODIUM 40 MG IV SOLR
40.0000 mg | Freq: Every day | INTRAVENOUS | Status: DC
  Administered 2020-01-29: 40 mg via INTRAVENOUS
  Filled 2020-01-29: qty 40

## 2020-01-29 MED ORDER — EPINEPHRINE 1 MG/10ML IJ SOSY
PREFILLED_SYRINGE | INTRAMUSCULAR | Status: AC | PRN
  Administered 2020-01-29 (×2): 1 mg via INTRAVENOUS

## 2020-01-29 MED ORDER — SODIUM BICARBONATE 8.4 % IV SOLN
INTRAVENOUS | Status: AC
  Filled 2020-01-29: qty 50

## 2020-01-29 MED ORDER — ORAL CARE MOUTH RINSE
15.0000 mL | OROMUCOSAL | Status: DC
  Administered 2020-01-29 (×5): 15 mL via OROMUCOSAL

## 2020-01-29 MED ORDER — SODIUM BICARBONATE-DEXTROSE 150-5 MEQ/L-% IV SOLN
150.0000 meq | INTRAVENOUS | Status: DC
  Filled 2020-01-29: qty 1000

## 2020-01-29 MED ORDER — ROCURONIUM BROMIDE 50 MG/5ML IV SOLN
INTRAVENOUS | Status: AC | PRN
  Administered 2020-01-29: 50 mg via INTRAVENOUS

## 2020-01-29 MED ORDER — NOREPINEPHRINE 4 MG/250ML-% IV SOLN
INTRAVENOUS | Status: AC | PRN
  Administered 2020-01-29: 20 ug/min via INTRAVENOUS

## 2020-01-29 MED ORDER — POLYETHYLENE GLYCOL 3350 17 G PO PACK: 17.0000 g | PACK | Freq: Every day | ORAL | Status: DC | PRN

## 2020-01-29 MED ORDER — DOCUSATE SODIUM 50 MG/5ML PO LIQD: 100.0000 mg | Freq: Two times a day (BID) | ORAL | Status: DC | PRN

## 2020-01-29 MED ORDER — CHLORHEXIDINE GLUCONATE 0.12% ORAL RINSE (MEDLINE KIT)
15.0000 mL | Freq: Two times a day (BID) | OROMUCOSAL | Status: DC
  Administered 2020-01-29 (×2): 15 mL via OROMUCOSAL

## 2020-01-29 MED ORDER — CALCIUM CHLORIDE 10 % IV SOLN
INTRAVENOUS | Status: AC
  Administered 2020-01-29: 1000 mg
  Filled 2020-01-29: qty 10

## 2020-01-29 MED ORDER — PHENYLEPHRINE 40 MCG/ML (10ML) SYRINGE FOR IV PUSH (FOR BLOOD PRESSURE SUPPORT)
PREFILLED_SYRINGE | INTRAVENOUS | Status: AC
  Filled 2020-01-29: qty 10

## 2020-01-29 MED ORDER — ETOMIDATE 2 MG/ML IV SOLN
INTRAVENOUS | Status: AC | PRN
  Administered 2020-01-29: 10 mg via INTRAVENOUS

## 2020-01-29 MED ORDER — EPINEPHRINE 0.1 MG/10ML (10 MCG/ML) SYRINGE FOR IV PUSH (FOR BLOOD PRESSURE SUPPORT)
PREFILLED_SYRINGE | INTRAVENOUS | Status: AC | PRN
  Administered 2020-01-29: 10 ug via INTRAVENOUS

## 2020-01-29 MED ORDER — SODIUM CHLORIDE 0.9 % IV SOLN
500.0000 mg | Freq: Every day | INTRAVENOUS | Status: DC
  Administered 2020-01-29: 500 mg via INTRAVENOUS
  Filled 2020-01-29 (×2): qty 500

## 2020-01-29 MED ORDER — EPINEPHRINE HCL 5 MG/250ML IV SOLN IN NS
INTRAVENOUS | Status: AC
  Administered 2020-01-29: 20 mg
  Filled 2020-01-29: qty 250

## 2020-01-29 MED ORDER — DEXTROSE 50 % IV SOLN: 12.5000 g | INTRAVENOUS | Status: AC

## 2020-01-29 MED ORDER — EPINEPHRINE PF 1 MG/ML IJ SOLN
0.5000 ug/min | INTRAVENOUS | Status: DC
  Administered 2020-01-29: 5 ug/min via INTRAVENOUS
  Filled 2020-01-29 (×2): qty 8

## 2020-01-29 MED ORDER — SODIUM BICARBONATE 8.4 % IV SOLN: 200.0000 meq | Freq: Once | INTRAVENOUS | Status: DC

## 2020-01-29 MED ORDER — SODIUM CHLORIDE 0.9 % IV SOLN
250.0000 mL | INTRAVENOUS | Status: DC
  Administered 2020-01-29: 250 mL via INTRAVENOUS

## 2020-01-29 MED ORDER — SODIUM CHLORIDE 0.9% IV SOLUTION: Freq: Once | INTRAVENOUS | Status: AC

## 2020-01-29 MED ORDER — DOCUSATE SODIUM 100 MG PO CAPS: 100.0000 mg | ORAL_CAPSULE | Freq: Two times a day (BID) | ORAL | Status: DC | PRN

## 2020-01-29 MED ORDER — CHLORHEXIDINE GLUCONATE CLOTH 2 % EX PADS
6.0000 | MEDICATED_PAD | Freq: Every day | CUTANEOUS | Status: DC
  Administered 2020-01-29: 6 via TOPICAL

## 2020-01-29 MED ORDER — NOREPINEPHRINE 16 MG/250ML-% IV SOLN
0.0000 ug/min | INTRAVENOUS | Status: DC
  Administered 2020-01-29: 40 ug/min via INTRAVENOUS
  Administered 2020-01-29: 10 ug/min via INTRAVENOUS
  Filled 2020-01-29 (×2): qty 250

## 2020-01-29 MED ORDER — PHENYLEPHRINE 40 MCG/ML (10ML) SYRINGE FOR IV PUSH (FOR BLOOD PRESSURE SUPPORT)
80.0000 ug | PREFILLED_SYRINGE | Freq: Once | INTRAVENOUS | Status: AC
  Administered 2020-01-29: 80 ug via INTRAVENOUS

## 2020-01-29 MED ORDER — SODIUM CHLORIDE 0.9 % IV SOLN: INTRAVENOUS | Status: DC | PRN

## 2020-01-29 MED ORDER — DEXTROSE 50 % IV SOLN
INTRAVENOUS | Status: AC
  Administered 2020-01-29: 12.5 g via INTRAVENOUS
  Filled 2020-01-29: qty 50

## 2020-01-29 MED ORDER — NOREPINEPHRINE 4 MG/250ML-% IV SOLN
INTRAVENOUS | Status: AC
  Filled 2020-01-29: qty 250

## 2020-01-29 MED ORDER — SODIUM BICARBONATE 8.4 % IV SOLN
INTRAVENOUS | Status: AC | PRN
  Administered 2020-01-29 (×3): 50 meq via INTRAVENOUS

## 2020-01-29 MED ORDER — HYDROCORTISONE NA SUCCINATE PF 100 MG IJ SOLR
50.0000 mg | Freq: Four times a day (QID) | INTRAMUSCULAR | Status: DC
  Administered 2020-01-29 (×4): 50 mg via INTRAVENOUS
  Filled 2020-01-29 (×4): qty 2

## 2020-01-29 MED ORDER — LEVOTHYROXINE SODIUM 50 MCG PO TABS
50.0000 ug | ORAL_TABLET | Freq: Every day | ORAL | Status: DC
  Filled 2020-01-29: qty 1

## 2020-01-29 MED ORDER — SODIUM BICARBONATE 8.4 % IV SOLN
50.0000 meq | Freq: Once | INTRAVENOUS | Status: AC
  Administered 2020-01-29: 50 meq via INTRAVENOUS

## 2020-01-29 MED ORDER — NOREPINEPHRINE 4 MG/250ML-% IV SOLN
0.0000 ug/min | INTRAVENOUS | Status: DC
  Administered 2020-01-29: 15 ug/min via INTRAVENOUS
  Administered 2020-01-29: 20 ug/min via INTRAVENOUS
  Administered 2020-01-29: 25 ug/min via INTRAVENOUS
  Administered 2020-01-29: 15 ug/min via INTRAVENOUS
  Filled 2020-01-29 (×3): qty 250

## 2020-01-29 MED ORDER — INSULIN ASPART 100 UNIT/ML ~~LOC~~ SOLN: 0.0000 [IU] | SUBCUTANEOUS | Status: DC

## 2020-01-29 MED ORDER — LACTATED RINGERS IV BOLUS
1000.0000 mL | Freq: Once | INTRAVENOUS | Status: AC
  Administered 2020-01-29: 1000 mL via INTRAVENOUS

## 2020-01-29 MED ORDER — GLUCAGON HCL RDNA (DIAGNOSTIC) 1 MG IJ SOLR
1.0000 mg/h | INTRAVENOUS | Status: DC
  Administered 2020-01-29: 1 mg/h via INTRAVENOUS
  Filled 2020-01-29 (×2): qty 5

## 2020-01-29 MED ORDER — FENTANYL CITRATE (PF) 100 MCG/2ML IJ SOLN
50.0000 ug | INTRAMUSCULAR | Status: DC | PRN
  Filled 2020-01-29: qty 2

## 2020-01-29 MED ORDER — SODIUM BICARBONATE-DEXTROSE 150-5 MEQ/L-% IV SOLN
150.0000 meq | INTRAVENOUS | Status: DC
  Administered 2020-01-29: 150 meq via INTRAVENOUS
  Filled 2020-01-29 (×2): qty 1000

## 2020-01-29 MED ORDER — LIDOCAINE HCL 1 % IJ SOLN
10.0000 mL | Freq: Once | INTRAMUSCULAR | Status: DC
  Filled 2020-01-29: qty 10

## 2020-01-29 MED FILL — Medication: Qty: 1 | Status: AC

## 2020-01-29 NOTE — Code Documentation (Signed)
Pulse check, femoral pulse present. CPR stopped

## 2020-01-29 NOTE — Progress Notes (Signed)
PCCM progress note  Met with daughter and rest of family today at bedside and discussed clinical situation [name] is a multiorgan failure severe acidosis and profound shock on multiple pressors including norepinephrine, epinephrine. Prognosis is poor with little chance of recovery They asked about dialysis but I told that she would not be able to tolerate it due to her hemodynamic instability.  We have agreed to make DNR.  Give her 24 more hours and reassess tomorrow.  Family is not ready for comfort measures just yet Family is aware that she may pass away today even before family meeting tomorrow.  Additional time for meeting- 10 mins  Katherine Konz MD [facility] Please see [email or web address] for pager details.  01/29/2020, 5:15 PM

## 2020-01-29 NOTE — Progress Notes (Signed)
  Echocardiogram 2D Echocardiogram has been performed.  [name] G Dreonna Hussein 01/29/2020, 9:05 AM

## 2020-01-29 NOTE — Progress Notes (Signed)
NAME:  [name], MRN:  [id number], DOB:  Jun 12, 1946, LOS: 0 ADMISSION DATE:  02/17/2020, CONSULTATION DATE:  01/29/20 REFERRING MD:  Christy Gentles, CHIEF COMPLAINT:  Shortness of breath  Brief History   74 year old with a history of atrial fibrillation, sick sinus syndrome, pacemaker, HTN, HLD, DM2, recent Covid 19 1 month ago with persistent hypoxemia req 2L [place] o2 at home, recently admitted for pna and CHF exacerbation, now here with increasing dyspnea x 1 day and septic shock.   PEA cardiac arrest in ED, emergently intubated and lined.   History of present illness   She has been increasingly short of breath today.  Increased home o2 to 3L.   Denied pain to me only increasing shortness of breath.  Was not able to get much additional history from her.    Admitted 5/26-6/5 with PNA and CHF exacerbation.   continued on lasix with worsening renal failure.  Lasix held. Thoracentesis done, 1L removed, showing transudate.   Renal function improved and lasix restarted prior to discharge. Changed to metop 100 BID and verapamil 240, stopped coreg 12.5 BID and diltiazem 360.  Completed course of antibiotics for pna.   In ER today, admitted with lactate >11, persistent hypotension despite fluids (1L).  As I was initially seeing her, starting levophed upon my arrival and getting set up to place an arterial line followed by central line, she had a cardiac arrest (PEA).   Post arrest persistent hypotension, severely reduced EF by my bedside echo and bradycardia.  No RV dilation, no RV dysfunction noted, no pericardial effusion.   Past Medical History  atrial fibrillation, sick sinus syndrome, pacemaker, HTN, HLD, DM2, recent Covid 19 1 month ago (on 2L  since d/c)  Home meds:  Albuterol Lipitor 10 Benzonatate Calcium Estradiol Folic acid Lasix 40 BID leflunomide 10mg  daily Levothyroixin 50 mcg daily Metformin 500mg   Robaxin Metop 100BID protonix 40 mg daily  vit B12 eliquis 5mg  BID    Recently stopped coreg 12.5, losartan 50 mg daily, diltiazem 360 mg   Significant Hospital Events   Cardiac arrest, PEA x 2  Consults:  Cardiology  medtr[employer]  Procedures:  Intubation 6/8 Central line L femoral 6/8  Arterial line L femoral 6/8  Significant Diagnostic Tests:   CXR 6/8: Endotracheal tube 3 cm from the carina. 2. Transesophageal tube tip below the level of imaging, beyond the GE junction. 3. Left pleural effusion and airspace opacity with volume loss, similar to prior. 4. Persistent right basilar bandlike opacity with increasing patchy opacities elsewhere in the right lung. Could reflect developing infection worsening atelectasis the appropriate clinical context.  Micro Data:  Blood cultures pending Sputum cultures pending  Antimicrobials:  Zosyn 6/7 Vanc 6/7- Meropenem 6/8- Azithromycin 6/8-  Interim history/subjective:   Remains in profound shock, acidosis, getting FFP for elevated INR, bleeding around catheter sites Unresponsive on the vent  Objective   Blood pressure (!) 135/55, pulse 71, temperature (!) 93.9 F (34.4 C), resp. rate (!) 24, height 4\' 10"  (1.473 m), weight 82.2 kg, SpO2 97 %.    Vent Mode: PRVC FiO2 (%):  [40 %-100 %] 40 % Set Rate:  [24 bmp] 24 bmp Vt Set:  [330 mL-400 mL] 400 mL PEEP:  [5 cmH20-10 cmH20] 5 cmH20 Plateau Pressure:  [18 cmH20-21 cmH20] 18 cmH20   Intake/Output Summary (Last 24 hours) at 01/29/2020 0943 Last data filed at 01/29/2020 0931 Gross per 24 hour  Intake 3885.77 ml  Output 0 ml  Net  3885.77 ml   Filed Weights   01/25/2020 2300  Weight: 82.2 kg    Examination: prior to cardiac arrest  HEENT: Chronically ill-appearing Neck:     No masses; no thyromegaly, ET tube Lungs:    Clear to auscultation bilaterally; normal respiratory effort CV:         Regular rate and rhythm; no murmurs Abd:      + bowel sounds; soft, non-tender; no palpable masses, no distension Ext:    No edema; adequate peripheral  perfusion Skin:      Warm and dry; no rash Neuro: Sedated, unresponsive  Resolved Hospital Problem list     Assessment & Plan:  Cardiac Arrest: PEA. Likely 2/2 acidosis in the setting of hypotension, septic shock, lactic acidosis. Acidosis likely cause of her perceived dyspnea as well.   Septic shock likely underlying cause, PNA possible cause.  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  No signs of large PE or pericardial effusion on bedside ultrasound.   Ischemic liver developing.  AKI (completely anuric).   Not a candidate for cooling after her in-hospital cardiac arrest, PEA- hemodynamic instability precludes. Coagulopathy.  Continue pressors, wean down as tolerated  Hypotension: Septic, cardiogenic No evidence of acute ischemia/CAD as underlying cause.  Continue broad antibiotic coverage, IV fluids Wean down pressors as tolerated Follow-up echocardiogram  Multiorgan failure with elevated liver enzymes, AKI, anuric Continue supportive care  Afib: currently fully paced.  Hold eliquis, esp given very elevated INR>   Goals of care Discussed with daughter over telephone.  Discussed poor prognosis given worsening multiorgan failure Recommended DNR status and daughter agrees Told family that she will likely not survive this hospitalization and they are planning to visit soon  Best practice:  Diet: npo Pain/Anxiety/Delirium protocol (if indicated): as needed  VAP protocol (if indicated):  DVT prophylaxis: heparin GI prophylaxis: protonix Glucose control: ISS Mobility: bed  Code Status: DNR Family Communication: See above Disposition: ICU  Critical care time:    The patient is critically ill with multiple organ system failure and requires high complexity decision making for assessment and support, frequent evaluation and titration of therapies, advanced monitoring, review of  radiographic studies and interpretation of complex data.   Critical Care Time devoted to patient care services, exclusive of separately billable procedures, described in this note is 35 minutes.   Marshell Garfinkel MD [facility] Please see [email or web address] for pager details.  01/29/2020, 9:48 AM

## 2020-01-29 NOTE — Progress Notes (Signed)
Patient ID: [name], female   DOB: Apr 25, 1946, 74 y.o.   MRN: [id number]  This NP Wadie Lessen reviewed medical records, received report from team, assessed the patient and then meet at the patient's bedside  to discuss diagnosis, prognosis, GOC,  and options.  Patient's daughter/[name] and granddaughter at the bedside.  Patient's grandson is on the way to the hospital presently.  Concept of Palliative Care was introduced as specialized medical care for people and their families living with serious illness.  If focuses on providing relief from the symptoms and stress of a serious illness.  The goal is to improve quality of life for both the patient and the family.  Daughter verbalizes an understanding of the seriousness of the current medical situation and that ultimately comfort is a priority for her mother however at this time she is asking for "a little time" to think about things and agrees to meet with me tomorrow morning at 10 AM for a full goals of care conversation.   Family  encouraged to call with questions or concerns.     Meeting is scheduled for tomorrow morning at 10 AM   Discussed with bedside RN/Sybil  Wadie Lessen NP  Palliative Medicine Team Team Phone # [phone] Pager [phone]  No charge

## 2020-01-29 NOTE — Progress Notes (Signed)
Pharmacy Antibiotic Note  [name] is a 74 y.o. female admitted on 02/01/2020 with pneumonia.  Pharmacy has been consulted for Vancomycin and Meropenem dosing.  Vanc 1750mg  IV load given in ED  Plan: Meropenem 1gm IV q12h Vancomycin 1250 mg IV Q 24 hrs.  Will f/u renal function, micro data, and pt's clinical condition Vanc trough prn   Height: 4\' 10"  (147.3 cm) Weight: 82.2 kg (181 lb 3.5 oz) IBW/kg (Calculated) : 40.9  Temp (24hrs), Avg:98 F (36.7 C), Min:96.7 F (35.9 C), Max:100.1 F (37.8 C)  Recent Labs  Lab 01/24/20 0327 01/25/20 0411 01/26/20 0429 02/08/2020 2307 02/17/2020 2311 01/26/2020 2327  WBC  --   --   --  14.3*  --   --   CREATININE 0.78 0.85 1.02* 1.43*  --  1.30*  LATICACIDVEN  --   --   --   --  >11.0*  --     Estimated Creatinine Clearance: 33.9 mL/min (A) (by C-G formula based on SCr of 1.3 mg/dL (H)).    Allergies  Allergen Reactions  . Monopril [Fosinopril] Cough  . Magnesium-Containing Compounds Nausea Only and Other (See Comments)    Re: Mag-Ox at higher doses caused a lot of stomach upset (dose had to be decreased)  . Norvasc [Amlodipine Besylate] Other (See Comments)    10 mg dose caused LE edema- can tolerate 5 mg dose    Antimicrobials this admission: 6/8 Zosyn x 1 6/8 Vanc >>  6/8 Meropenem >>   Microbiology results: 6/7 BCx:   UCx:    Trach asp:    Thank you for allowing pharmacy to be a part of this patient's care.  Sherlon Handing, PharmD, BCPS Please see amion for complete clinical pharmacist phone list 01/29/2020 2:49 AM

## 2020-01-29 NOTE — ED Provider Notes (Signed)
I assumed care in signout to monitor patient prior to [facility].  Patient is septic with likely source of pneumonia.  Patient is awake alert but is tachypneic.  She is hypotensive.  I had a conversation with the patient that she will likely require central line placement as well as potential intubation.  Patient reports she is never had a conversation about intubation.  Also spoke to daughter via phone about the critical nature of her illness and may need intubation as well as central line.  For blood pressure support phenylephrine has been ordered.  I discussed the case with Dr. Patsey Berthold with critical care who is assuming care of patient.   Ripley Fraise, MD 01/29/20 0110

## 2020-01-29 NOTE — Progress Notes (Signed)
Hypoglycemic Event  CBG: 61  Treatment: D50 25 mL (12.5 gm)  Symptoms: UTA; pt intubated and unresponsive.  Follow-up CBG: [id number] CBG Result:138  Possible Reasons for Event: Unknown  Comments/MD notified: Dr. Remi Deter, Aneisha Skyles Dyann Ruddle

## 2020-01-29 NOTE — Progress Notes (Signed)
Checked on patient:  Still on epi 25, levophed 15.  HR now up to 75, sinus rhythm.  Given now in native rhythm, called medtr[employer] rep and told her doesn't need to come for now Improved after glucagon, starting glucagon gtt. Beta blocker overdose and or calcium channel overdose seems less likley given no positive history, but wasn't able to get a detailed ingestion history from her before cardiac arrest.   Now patient is bleeding from [employer], and art line. G tube.   FFP ordered at my request by tele ICU nurse.    Worsening metabolic acidosis despite intervetnion and better bp.   Will increase RR to 28.  Will order another dose calcium, another bolus bicarb.  Hold bicarb gtt, avoid further volume overload given very poor cardiac output and appearance of volume overload.     Pall care consult placed.

## 2020-01-29 NOTE — Procedures (Addendum)
Arterial Catheter Insertion Procedure Note [name] [id number] 03-Oct-1945  Procedure: Insertion of Arterial Catheter  Indications: Blood pressure monitoring  Procedure Details Consent: Unable to obtain consent because of emergent medical necessity. Time Out: Verified patient identification, verified procedure, site/side was marked, verified correct patient position, special equipment/implants available, medications/allergies/relevent history reviewed, required imaging and test results available.  Performed  Maximum sterile technique was used including antiseptics, cap, gloves, gown, hand hygiene, mask and sheet. Skin prep: Chlorhexidine; local anesthetic administered 20 gauge catheter was inserted into left femoral artery using the Seldinger technique. ULTRASOUND GUIDANCE USED: YES Evaluation Blood flow good; BP tracing good. Complications: No apparent complications.   Collier Bullock 01/29/2020

## 2020-01-29 NOTE — Procedures (Signed)
Intubation Procedure Note [name] [id number] 01-10-1946  Procedure: Intubation Indications: Airway protection and maintenance  Procedure Details Consent: Unable to obtain consent because of emergent medical necessity. Time Out: Verified patient identification, verified procedure, site/side was marked, verified correct patient position, special equipment/implants available, medications/allergies/relevent history reviewed, required imaging and test results available.   Maximum sterile technique was used including cap, gloves, hand hygiene and mask.  3    Evaluation Hemodynamic Status: cardiac arrest during intubation; O2 sats: not reading on sat monitor (cardiac arrest) Patient's Current Condition: unstable Complications: No apparent complications Patient did not tolerate procedure well. Chest X-ray ordered to verify placement.  CXR: pending.  Easy intubation with glidescope.  7.5 ET tube placed.   Collier Bullock 01/29/2020

## 2020-01-29 NOTE — Procedures (Signed)
Central Venous Catheter Insertion Procedure Note [name] [id number] 1945/12/17  Procedure: Insertion of Central Venous Catheter Indications: Drug and/or fluid administration  Procedure Details Consent: Unable to obtain consent because of emergent medical necessity. Time Out: Verified patient identification, verified procedure, site/side was marked, verified correct patient position, special equipment/implants available, medications/allergies/relevent history reviewed, required imaging and test results available.     Maximum sterile technique was used including antiseptics, cap, gloves, gown, hand hygiene, mask and sheet. Skin prep: Chlorhexidine; local anesthetic administered A antimicrobial bonded/coated triple lumen catheter was placed in the left femoral vein due to emergent situation using the Seldinger technique.  Evaluation Blood flow good Complications: No apparent complications Patient did tolerate procedure well. Chest X-ray ordered to verify placement.  CXR: ba.NA  Collier Bullock 01/29/2020, 6:18 AM

## 2020-01-29 NOTE — Progress Notes (Signed)
I responded to page from the nurse to provide spiritual support for the patient's daughter and granddaughter. I visited with the family in the consultation room then guided them to Trauma B to visit with Inez Catalina before she was to be moved to another unit. I provided spiritual support through pastoral presence, by reading scripture, and leading in prayer. I shared words of encouragement.    01/29/20 0400  Clinical Encounter Type  Visited With Patient and family together  Visit Type Spiritual support;Critical Care;ED  Referral From Nurse  Consult/Referral To Chaplain  Stress Factors  Family Stress Factors Exhausted     Chaplain Dr Redgie Grayer

## 2020-01-29 NOTE — Code Documentation (Signed)
Pulse check, pulses back

## 2020-01-29 NOTE — Progress Notes (Signed)
Patient transported to 7J88 without complications.

## 2020-01-29 NOTE — Progress Notes (Signed)
eLink Physician-Brief Progress Note Patient Name: [name] DOB: 1945/09/25 MRN: [id number]   Date of Service  01/29/2020  HPI/Events of Note  Patient is on an Epinephrine IV infusion. No order for Epinephrine IV infusion.   eICU Interventions  Plan: 1. Epinephrine IV infusion. Titrate to MAP >= 65.      Intervention Category Major Interventions: Hypotension - evaluation and management  [name] 01/29/2020, 5:22 AM

## 2020-01-29 NOTE — H&P (Signed)
NAME:  [name], MRN:  [id number], DOB:  21-Jun-1946, LOS: 0 ADMISSION DATE:  02/03/2020, CONSULTATION DATE:  01/29/20 REFERRING MD:  Christy Gentles, CHIEF COMPLAINT:  Shortness of breath  Brief History   75 year old with a history of atrial fibrillation, sick sinus syndrome, pacemaker, HTN, HLD, DM2, recent Covid 19 1 month ago with persistent hypoxemia req 2L [place] o2 at home, recently admitted for pna and CHF exacerbation, now here with increasing dyspnea x 1 day and septic shock.   PEA cardiac arrest in ED, emergently intubated and lined.   History of present illness   She has been increasingly short of breath today.  Increased home o2 to 3L.   Denied pain to me only increasing shortness of breath.  Was not able to get much additional history from her.    Admitted 5/26-6/5 with PNA and CHF exacerbation.   continued on lasix with worsening renal failure.  Lasix held. Thoracentesis done, 1L removed, showing transudate.   Renal function improved and lasix restarted prior to discharge. Changed to metop 100 BID and verapamil 240, stopped coreg 12.5 BID and diltiazem 360.  Completed course of antibiotics for pna.   In ER today, admitted with lactate >11, persistent hypotension despite fluids (1L).  As I was initially seeing her, starting levophed upon my arrival and getting set up to place an arterial line followed by central line, she had a cardiac arrest (PEA).   Post arrest persistent hypotension, severely reduced EF by my bedside echo and bradycardia.  No RV dilation, no RV dysfunction noted, no pericardial effusion.   Past Medical History  atrial fibrillation, sick sinus syndrome, pacemaker, HTN, HLD, DM2, recent Covid 19 1 month ago (on 2L [place] since d/c)  Home meds:  Albuterol Lipitor 10 Benzonatate Calcium Estradiol Folic acid Lasix 40 BID leflunomide 10mg  daily Levothyroixin 50 mcg daily Metformin 500mg   Robaxin Metop 100BID protonix 40 mg daily  vit B12 eliquis 5mg  BID    Recently stopped coreg 12.5, losartan 50 mg daily, diltiazem 360 mg   Significant Hospital Events   Cardiac arrest, PEA x 2   Consults:  Cardiology  medtronic  Procedures:  Intubation 6/8 Central line L femoral 6/8  Arterial line L femoral 6/8  Significant Diagnostic Tests:   CXR 6/8: Endotracheal tube 3 cm from the carina. 2. Transesophageal tube tip below the level of imaging, beyond the GE junction. 3. Left pleural effusion and airspace opacity with volume loss, similar to prior. 4. Persistent right basilar bandlike opacity with increasing patchy opacities elsewhere in the right lung. Could reflect developing infection worsening atelectasis the appropriate clinical context.  Micro Data:  Blood cultures pending Sputum cultures pending  Antimicrobials:  Zosyn 6/7 Vanc 6/7- Meropenem 6/8- Azithromycin 6/8-  Interim history/subjective:    Objective   Blood pressure 98/60, pulse (!) 59, temperature (!) 97.3 F (36.3 C), resp. rate (!) 24, height 4\' 10"  (1.473 m), weight 82.2 kg, SpO2 (!) 76 %.    Vent Mode: PRVC FiO2 (%):  [100 %] 100 % Set Rate:  [24 bmp] 24 bmp Vt Set:  [330 mL] 330 mL PEEP:  [10 cmH20] 10 cmH20 Plateau Pressure:  [21 cmH20] 21 cmH20  No intake or output data in the 24 hours ending 01/29/20 0300 Filed Weights   01/27/2020 2300  Weight: 82.2 kg    Examination: prior to cardiac arrest  General: uncomfortable appearing, says she cant breath, asking to be intubated, complaints out of proportion to witnessed work  of breathing HENT: ncat Lungs: minimal BS, clear B after intubation Cardiovascular: RRR no mgr Abdomen: nt, nd/nbs Extremities: no edema no erhythema no tenderness  Neuro: alert initially, answering direct questions  After arrest, non responsive (had received rocuronium 1 hr prior), no withdrawal to pain.  Pupils equal non reactive Clear BS B    Bedside echo: globally very reduced EF.  No RV enlargment.  IVC enlarged, no  respiratory variation.  Resolved Hospital Problem list     Assessment & Plan:  Cardiac Arrest: PEA. Likely 2/2 acidosis in the setting of hypotension, septic shock, lactic acidosis. Acidosis likely cause of her perceived dyspnea as well.   Septic shock likely underlying cause, PNA possible cause.  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  No signs of large PE or pericardial effusion on bedside ultrasound.   Ischemic liver developing.  AKI (completely anuric).   Not a candidate for cooling after her in-hospital cardiac arrest, PEA- hemodynamic instability precludes. Coagulopathy.  Cont levophed 38mcg and epi 25 mcg.    Hypotension: As above, septic and cardiac component.  No evidence of acute ischemia/CAD as underlying cause.  Sepsis: treat with antibiotics, will broaden to include MDR given recent hospitalization and recent antibiotics (mero, vanc, azithro) No more fluids (1.5 L given) given her cardiogenic component.  Cont levophed, epinephrine.   Follow up lactate  Will need to replace femoral line (done emergently) with IJ line in future if stabilized and survives ROSC , could then also follow CVP and SCVO2 for what that is worth.    Remains inappropriately bradycardic and fully paced despite inotropes (even after code dose epi).  Calling medtronic rep for help with resetting programmed rate for now with increase CO with increased HR.  Consider component of Beta blocker and or calcium channel blocker toxicity?  Less likely given lack of history of overdose.  Medications were recently started however.  Pt unable to give detailed hx.   Trial of glucagon, see if HR improves.   No isproterenol or dobutamine given hypotension.  Calcium ordered.   Ischemic liver AKI, anuric  HTN; hold anti HTN currently hypotensive  Hypothyroidism: tsh elevated but FTF normal.   Afib: currently fully  paced.  Hold eliquis, esp given very elevated INR>     Best practice:  Diet: npo Pain/Anxiety/Delirium protocol (if indicated): as needed  VAP protocol (if indicated):  DVT prophylaxis: heparin GI prophylaxis: protonix Glucose control: ISS Mobility: bed  Code Status: Discussed with daughter amy.  Discussed multiorgan failure and poor prognosis.  She said " please do whatever you can to keep her alive" Family Communication:  Disposition: ICU  Labs   CBC: Recent Labs  Lab 01/25/20 0411 01/22/2020 2307 01/23/2020 2327 01/29/20 0152 01/29/20 0235  WBC  --  14.3*  --   --   --   NEUTROABS  --  9.9*  --   --   --   HGB 10.0* 10.2* 10.5*  10.5* 9.2* 8.5*  HCT 32.1* 34.4* 31.0*  31.0* 27.0* 25.0*  MCV  --  100.0  --   --   --   PLT  --  271  --   --   --     Basic Metabolic Panel: Recent Labs  Lab 01/22/20 0825 01/22/20 0825 01/23/20 1308 01/23/20 6578 01/24/20 0327 01/24/20 0327 01/25/20 0411 01/25/20 0411 01/26/20 4696 02/05/2020 2307 02/12/2020 2327 01/29/20 2952 01/29/20 0235  NA 139   < > 136   < > 136   < > 138   < > 138 133* 136  137 140 140  K 4.3   < > 3.9   < > 3.3*   < > 4.2   < > 3.3* 5.6* 5.1  5.1 5.9* 5.0  CL 103   < > 103   < > 100  --  100  --  98 91* 98  --   --   CO2 23   < > 22  --  26  --  27  --  27 17*  --   --   --   GLUCOSE 177*   < > 173*   < > 152*  --  127*  --  128* 206* 185*  --   --   BUN 13   < > 14   < > 12  --  15  --  17 19 19   --   --   CREATININE 0.73   < > 0.73   < > 0.78  --  0.85  --  1.02* 1.43* 1.30*  --   --   CALCIUM 8.2*   < > 8.3*  --  8.4*  --  8.8*  --  8.8* 8.4*  --   --   --   MG 1.6*  --  1.7  --  1.3*  --  1.6*  --   --   --   --   --   --   PHOS 2.8  --  2.4*  --  2.7  --  3.0  --  3.6  --   --   --   --    < > = values in this interval not displayed.   GFR: Estimated Creatinine Clearance: 33.9 mL/min (A) (by C-G formula based on SCr of 1.3 mg/dL (H)). Recent Labs  Lab 02/08/2020 2307 01/29/2020 2311  WBC 14.3*  --    LATICACIDVEN  --  >11.0*    Liver Function Tests: Recent Labs  Lab 01/23/20 3016 01/24/20 0327 01/25/20 0411 01/26/20 0429 02/12/2020 2307  AST  --   --   --   --  283*  ALT  --   --   --   --  292*  ALKPHOS  --   --   --   --  265*  BILITOT  --   --   --   --  1.3*  PROT  --   --   --   --  6.2*  ALBUMIN 2.3* 2.3* 2.4* 2.2* 2.4*   No results for input(s): LIPASE, AMYLASE in the last 168 hours. Recent Labs  Lab 01/22/2020 2307  AMMONIA 82*    ABG    Component Value Date/Time   PHART 7.130 (LL) 01/29/2020 0235   PCO2ART 40.7 01/29/2020 0235   PO2ART 131 (H) 01/29/2020 0235   HCO3 13.6 (L) 01/29/2020 0235   TCO2 15 (L) 01/29/2020 0235   ACIDBASEDEF 15.0 (H) 01/29/2020 0235   O2SAT 98.0 01/29/2020 0235     Coagulation Profile: No results for input(s): INR, PROTIME in the last 168 hours.  Cardiac Enzymes: No results for input(s): CKTOTAL, CKMB, CKMBINDEX, TROPONINI in the last 168 hours.  HbA1C: Hgb A1C (fingerstick)  Date/Time Value Ref Range Status  10/10/2014 08:54 AM 5.7 <5.7 % Final    Comment:  According to the ADA Clinical Practice Recommendations for 2011, when HbA1c is used as a screening test:     >=6.5%   Diagnostic of Diabetes Mellitus            (if abnormal result is confirmed)   5.7-6.4%   Increased risk of developing Diabetes Mellitus   References:Diagnosis and Classification of Diabetes Mellitus,Diabetes Care,2011,34(Suppl 1):S62-S69 and Standards of Medical Care in         Diabetes - 2011,Diabetes FVCB,4496,75 (Suppl 1):S11-S61.     07/04/2013 08:37 AM 6.2 (H) <5.7 % Final    Comment:                                                                           According to the ADA Clinical Practice Recommendations for 2011, when HbA1c is used as a screening test:     >=6.5%   Diagnostic of Diabetes Mellitus            (if abnormal result is confirmed)   5.7-6.4%    Increased risk of developing Diabetes Mellitus   References:Diagnosis and Classification of Diabetes Mellitus,Diabetes FFMB,8466,59(DJTTS 1):S62-S69 and Standards of Medical Care in         Diabetes - 2011,Diabetes Care,2011,34 (Suppl 1):S11-S61.     Hgb A1c MFr Bld  Date/Time Value Ref Range Status  01/19/2020 08:40 AM 8.4 (H) 4.8 - 5.6 % Final    Comment:    (NOTE) Pre diabetes:          5.7%-6.4% Diabetes:              >6.4% Glycemic control for   <7.0% adults with diabetes   12/25/2019 11:53 AM 8.9 (H) <5.7 % of total Hgb Final    Comment:    For someone without known diabetes, a hemoglobin A1c value of 6.5% or greater indicates that they may have  diabetes and this should be confirmed with a follow-up  test. . For someone with known diabetes, a value <7% indicates  that their diabetes is well controlled and a value  greater than or equal to 7% indicates suboptimal  control. A1c targets should be individualized based on  duration of diabetes, age, comorbid conditions, and  other considerations. . Currently, no consensus exists regarding use of hemoglobin A1c for diagnosis of diabetes for children. .     CBG: Recent Labs  Lab 01/25/20 1112 01/25/20 1603 01/25/20 2151 01/26/20 0807 02/20/2020 2257  GLUCAP 162* 153* 154* 123* 201*    Review of Systems:   Unable to assess  Past Medical History  She,  has a past medical history of Abrasion of skin with infection (05/09/2013), Acute blood loss anemia (08/05/2012), Anemia (11/21/2013), Arthritis, Atrial fibrillation ([place]), Bursitis of right shoulder, Chronic cholecystitis with calculus (05/04/2013), Diabetes mellitus without complication ([place]), Diabetes type 2, controlled ([place]) (05/04/2013), Essential hypertension, benign (10/14/2012), Fatty liver disease, nonalcoholic (1779), Gastroenteritis (10/16/2013), GERD (gastroesophageal reflux disease), History of hypertension (08/20/2016), History of total bilateral knee replacement  (06/22/2017), Hormone replacement therapy (postmenopausal), 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), Hyperlipidemia, Hypertension, Hypertensive urgency (07/11/2018), Hypokalemia (07/11/2018), Hypomagnesemia (07/21/2016), Hypothyroidism (03/28/2019), Malnutrition of moderate degree ([place]) (03/18/2014), Morbid obesity due to excess calories ([place]) (12/27/2012), Multiple pulmonary nodules (04/04/2014), Nausea & vomiting (07/11/2018),  Normal cardiac stress test, Obesity, unspecified (12/27/2012), Osteoarthritis of right knee (08/05/2012), 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 without organ or systems involvement ([place]), Rheumatoid arthritis(714.0) (09/28/2012), Sepsis ([place]) (03/16/2014), Sinus pause (05/17/2019), SIRS (systemic inflammatory response syndrome) ([place]) (07/11/2018), Syncope and collapse (03/03/2019), 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   Surgical History    Past Surgical History:  Procedure Laterality Date  . ABDOMINAL HYSTERECTOMY    . CHOLECYSTECTOMY N/A 05/04/2013   Procedure: LAPAROSCOPIC CHOLECYSTECTOMY WITH INTRAOPERATIVE CHOLANGIOGRAM;  Surgeon: Haywood Lasso, MD;  Location: [facility];  Service: General;  Laterality: N/A;  . ERCP N/A 05/02/2013   Procedure: ENDOSCOPIC RETROGRADE CHOLANGIOPANCREATOGRAPHY (ERCP);  Surgeon: Beryle Beams, MD;  Location: [facility];  Service: Endoscopy;  Laterality: N/A;  . IR THORACENTESIS ASP PLEURAL SPACE W/IMG GUIDE  01/18/2020  . JOINT REPLACEMENT     left knee  . NASAL SINUS SURGERY    . PACEMAKER IMPLANT N/A 06/11/2019   Procedure: PACEMAKER IMPLANT;  Surgeon: Deboraha Sprang, MD;  Location: [facility];  Service: Cardiovascular;  Laterality: N/A;  . SPHINCTEROTOMY  05/02/2013   Procedure: SPHINCTEROTOMY;  Surgeon: Beryle Beams, MD;  Location: [facility];  Service: Endoscopy;;  . TONSILLECTOMY    . TOTAL KNEE ARTHROPLASTY  08/01/2012    Procedure: TOTAL KNEE ARTHROPLASTY;  Surgeon: Meredith Pel, MD;  Location: [facility];  Service: Orthopedics;  Laterality: Right;  Right total knee arthroplasty  . TOTAL KNEE ARTHROPLASTY  08/01/2012   RIGHT  KNEE     Social History   reports that she has quit smoking. Her smoking use included cigarettes. She has a 0.50 pack-year smoking history. She has never used smokeless tobacco. She reports current alcohol use. She reports that she does not use drugs.   Family History   Her family history includes Cancer in her mother; Diabetes in her daughter and sister; Heart attack in her mother; Stroke in her father.   Allergies Allergies  Allergen Reactions  . Monopril [Fosinopril] Cough  . Magnesium-Containing Compounds Nausea Only and Other (See Comments)    Re: Mag-Ox at higher doses caused a lot of stomach upset (dose had to be decreased)  . Norvasc [Amlodipine Besylate] Other (See Comments)    10 mg dose caused LE edema- can tolerate 5 mg dose     Home Medications  Prior to Admission medications   Medication Sig Start Date End Date Taking? Authorizing Provider  albuterol (VENTOLIN HFA) 108 (90 Base) MCG/ACT inhaler Inhale 2 puffs into the lungs every 4 (four) hours as needed for wheezing or shortness of breath. 12/10/19   Ghimire, Henreitta Leber, MD  atorvastatin (LIPITOR) 10 MG tablet TAKE 1 TABLET BY MOUTH EVERY DAY Patient taking differently: Take 10 mg by mouth daily.  02/06/19   Delsa Grana, PA-C  benzonatate (TESSALON) 200 MG capsule Take 1 capsule (200 mg total) by mouth 3 (three) times daily. 01/15/20   Alycia Rossetti, MD  Calcium Carbonate-Vitamin D (CALCIUM 600 + D PO) Take 1 tablet by mouth 2 (two) times daily.    [provider]  chlorpheniramine-HYDROcodone (TUSSIONEX PENNKINETIC ER) 10-8 MG/5ML SUER Take 5 mLs by mouth every 12 (twelve) hours as needed. Patient taking differently: Take 5 mLs by mouth every 12 (twelve) hours as needed for cough.  01/15/20   Alycia Rossetti, MD  diclofenac sodium (VOLTAREN) 1 % GEL Apply 3 g topically daily as needed (to painful sites (in  3 large joints)).     [provider]  ELIQUIS 5 MG TABS tablet TAKE 1 TABLET BY MOUTH TWICE A DAY Patient taking differently: Take 5 mg by mouth 2 (two) times daily.  11/23/19   Lorretta Harp, MD  estradiol (ESTRACE) 0.5 MG tablet Take 0.5 mg by mouth 2 (two) times a day.    [provider]  etanercept (ENBREL SURECLICK) 50 MG/ML injection INJECT 50 MG (0.98 ML) UNDER THE SKIN ONCE A WEEK Patient taking differently: Inject 50 mg into the skin once a week. Thursdays (0.98 ML) 11/28/19   Bo Merino, MD  fish oil-omega-3 fatty acids 1000 MG capsule Take 1 g by mouth at bedtime.     [provider]  folic acid (FOLVITE) 1 MG tablet TAKE 1 TABLET BY MOUTH TWICE A DAY Patient taking differently: Take 1 mg by mouth 2 (two) times daily.  09/21/19   Alycia Rossetti, MD  furosemide (LASIX) 40 MG tablet Take 1 tablet (40 mg total) by mouth 2 (two) times daily. 01/09/20   Lorretta Harp, MD  leflunomide (ARAVA) 10 MG tablet TAKE 1 TABLET BY MOUTH EVERY DAY Patient taking differently: Take 10 mg by mouth daily.  09/20/19   Bo Merino, MD  levothyroxine (SYNTHROID) 50 MCG tablet TAKE 1 TABLET BY MOUTH DAILY BEFORE BREAKFAST Patient taking differently: Take 50 mcg by mouth daily before breakfast.  01/14/20   Alycia Rossetti, MD  magnesium oxide (MAG-OX) 400 MG tablet Take 1,200 mg by mouth 2 (two) times daily.  11/22/19   [provider]  metFORMIN (GLUCOPHAGE) 500 MG tablet Take 2 tablets (1,000 mg total) by mouth 2 (two) times daily with a meal. 12/27/19   [place], Modena Nunnery, MD  methocarbamol (ROBAXIN) 500 MG tablet Take 1 tablet (500 mg total) by mouth daily as needed for muscle spasms. Patient taking differently: Take 500 mg by mouth as needed for muscle spasms.  02/28/19   Ofilia Neas, PA-C  metoprolol tartrate (LOPRESSOR) 100 MG tablet Take 1  tablet (100 mg total) by mouth 2 (two) times daily. 01/26/20   Regalado, Cassie Freer, MD  Multiple Vitamins-Minerals (MULTIVITAMINS THER. W/MINERALS) TABS Take 1 tablet by mouth daily.    [provider]  ondansetron (ZOFRAN) 4 MG tablet Take 1 tablet (4 mg total) by mouth every 8 (eight) hours as needed for nausea or vomiting. 01/15/20   Alycia Rossetti, MD  pantoprazole (PROTONIX) 40 MG tablet TAKE 1 TABLET BY MOUTH EVERY DAY Patient taking differently: Take 40 mg by mouth daily.  01/15/20   [place], Modena Nunnery, MD  potassium chloride (KLOR-CON) 10 MEQ tablet Take 1 tablet (10 mEq total) by mouth daily. 10/03/19   Deboraha Sprang, MD  verapamil (CALAN-SR) 240 MG CR tablet Take 1 tablet (240 mg total) by mouth daily. 01/26/20   Regalado, Belkys A, MD  vitamin B-12 (CYANOCOBALAMIN) 500 MCG tablet Take 500 mcg by mouth daily.    [provider]     Critical care time: 60 min

## 2020-01-29 NOTE — Progress Notes (Signed)
Cardiac arrest. PEA I was evaluating patient at bedside.  Suddenly lost consciousness, no change in paced rhythm, BP rapidly dropped and lost pulse.   CPR started.  See code documentation paperwork for details.  Received 2mg  epi, 1 amp bicarb.  Patient bagged then emergently intubated.   Was satting in high 90s on 2L [place] prior.    Rosc achieved, patient started on levophed.    Arterial line placed in L fem.    Suddenly developed another episode cardiac arrest. PEA.  Another round of CPR, Epi and bicarb given, rosc achieved.

## 2020-01-29 NOTE — ED Provider Notes (Signed)
I was called to the room because patient went into cardiac arrest.  Dr. Patsey Berthold with critical care was already at bedside running the code.  Patient regained pulses, patient was intubated without difficulty. Patient will be admitted to ICU   Ripley Fraise, MD 01/29/20 0126

## 2020-01-29 NOTE — ED Provider Notes (Signed)
  IO LINE INSERTION  Date/Time: 01/29/2020 1:20 AM Performed by: Lorayne Bender, PA-C Authorized by: Lorayne Bender, PA-C   Consent:    Consent obtained:  Emergent situation Pre-procedure details:    Site preparation:  Alcohol Procedure details:    Insertion site:  R medial malleolus   Insertion device:  Drill device and 18 gauge IO needle   Insertion: Needle was inserted through the bony cortex     Number of attempts:  1   Insertion confirmation:  Aspiration of blood/marrow, easy infusion of fluids and stability of the needle Post-procedure details:    Secured with:  Protective shield and transparent dressing   Patient tolerance of procedure:  Tolerated well, no immediate complications     [name] 01/29/20 0130    Ripley Fraise, MD 01/29/20 [phone]

## 2020-01-29 NOTE — Progress Notes (Signed)
Critical ABG called to [place]. Given to CCM/RN at bedside.

## 2020-01-29 NOTE — Progress Notes (Signed)
Patients family called regarding decline in vitals despite epi/norepi settings being maxed out. Suggested they come to the bedside as she continues to decline and will likely not make it through the night.

## 2020-01-29 NOTE — ED Notes (Signed)
Family arrived, RN prepared them and updated them.  Chaplin bedside.

## 2020-01-29 NOTE — Telephone Encounter (Signed)
Late documentation for 01/24/2020:  Received call from [place], [facility] with [facility].   Reports that patient had been discharged from hospital, but had declined resumption of care on 6/7/20201.  New appointment scheduled for 02/29/2020.of note, patient has been re-admitted to hospital.

## 2020-01-30 LAB — BLOOD CULTURE ID PANEL (REFLEXED)

## 2020-01-30 LAB — PREPARE FRESH FROZEN PLASMA
Unit division: 0
Unit division: 0

## 2020-01-30 LAB — BPAM FFP
Blood Product Expiration Date: 202106122359
Blood Product Expiration Date: 202106122359
ISSUE DATE / TIME: 202106080632
ISSUE DATE / TIME: 202106080632
Unit Type and Rh: 600
Unit Type and Rh: 600

## 2020-01-30 MED ORDER — GLYCOPYRROLATE 1 MG PO TABS
1.0000 mg | ORAL_TABLET | ORAL | Status: DC | PRN
  Filled 2020-01-30: qty 1

## 2020-01-30 MED ORDER — POLYVINYL ALCOHOL 1.4 % OP SOLN
1.0000 [drp] | Freq: Four times a day (QID) | OPHTHALMIC | Status: DC | PRN
  Filled 2020-01-30: qty 15

## 2020-01-30 MED ORDER — MORPHINE SULFATE (PF) 2 MG/ML IV SOLN
2.0000 mg | INTRAVENOUS | Status: DC | PRN
  Administered 2020-01-30 (×2): 4 mg via INTRAVENOUS
  Filled 2020-01-30 (×2): qty 2

## 2020-01-30 MED ORDER — GLYCOPYRROLATE 0.2 MG/ML IJ SOLN: 0.2000 mg | INTRAMUSCULAR | Status: DC | PRN

## 2020-01-30 MED ORDER — ACETAMINOPHEN 325 MG PO TABS: 650.0000 mg | ORAL_TABLET | Freq: Four times a day (QID) | ORAL | Status: DC | PRN

## 2020-01-30 MED ORDER — DEXTROSE 5 % IV SOLN: INTRAVENOUS | Status: DC

## 2020-01-30 MED ORDER — ACETAMINOPHEN 650 MG RE SUPP: 650.0000 mg | Freq: Four times a day (QID) | RECTAL | Status: DC | PRN

## 2020-01-30 MED ORDER — DIPHENHYDRAMINE HCL 50 MG/ML IJ SOLN: 25.0000 mg | INTRAMUSCULAR | Status: DC | PRN

## 2020-01-31 LAB — CULTURE, BLOOD (ROUTINE X 2): Special Requests: ADEQUATE

## 2020-02-03 LAB — CULTURE, BLOOD (ROUTINE X 2): Culture: NO GROWTH

## 2020-02-04 ENCOUNTER — Telehealth: Payer: Self-pay | Admitting: Rheumatology

## 2020-02-04 NOTE — Telephone Encounter (Signed)
FYI: Patient's daughter called to let you know patient passed away this weekend.

## 2020-02-13 ENCOUNTER — Ambulatory Visit: Payer: PPO | Admitting: Cardiovascular Disease

## 2020-02-19 LAB — FUNGUS CULTURE WITH STAIN

## 2020-02-19 LAB — FUNGAL ORGANISM REFLEX

## 2020-02-19 LAB — FUNGUS CULTURE RESULT

## 2020-02-21 NOTE — Progress Notes (Signed)
[facility] Progress Note Patient Name: [name] DOB: 1945-10-14 MRN: [id number]   Date of Service  2020/02/18  HPI/Events of Note  Spoke with patient's daughter, [name], Who relates that she and the family are ready to move to comfort care given her mother's poor prognosis for meaningful recovery and allow her mother to pass with comfort and dignity.   eICU Interventions  Plan: 1. Will proceed with comfort care via the PCCM withdrawal of life sustaining care protocol.      Intervention Category Major Interventions: End of life / care limitation discussion  Lysle Dingwall 02-18-2020, 12:23 AM

## 2020-02-21 NOTE — Consult Note (Signed)
Responded to nurse's call, provided spiritual/emotional/ grief support to 4 family members bedside. They  especially appreciated prayer. Family is [place]. Let them know they may ask a nurse to page again for a chaplain whenever there is further need of chaplain services.  Rev. Eloise Levels Chaplain

## 2020-02-21 NOTE — Progress Notes (Signed)
Time of death at 21. Rhythm asystole. Verified by me and Everlene Other, RN. Rhythm strip printed and placed in chart.

## 2020-02-21 NOTE — Discharge Summary (Signed)
Physician Death Summary  Patient ID: KAMYLAH MANZO MRN: [id number] DOB/AGE: [birth year] 74 y.o.  Admit date: 02/11/2020 Discharge date: 02-12-2020  Admission Diagnoses: Acute respiratory failure Cardiac arrest  Discharge Diagnoses:  Cardiac arrest Acute respiratory failure Septic shock Multiorgan failure Atrial fibrillation  Discharged Condition: Deceased  Hospital Course:  73 year old with a history of atrial fibrillation, sick sinus syndrome, pacemaker, HTN, HLD, DM2, recent Covid 19 1 month ago with persistent hypoxemia req 2L [place] o2 at home, recently admitted for pna and CHF exacerbation admit with dyspnea x 1 day and septic shock.   PEA cardiac arrest in ED, emergently intubated and central line placed  Through the hospital course she remained in severe multiorgan failure with shock liver, AKI with anuric renal failure, profound shock requiring epinephrine, Levophed and ARDS. Discussed with family at bedside.  She was initially made DNR.  Palliative care was involved as well. She continued to deteriorate and family was called.  Family made a decision to move to comfort care.  She was compassionately extubated on 2023/02/12 and passed away shortly thereafter.  Marshell Garfinkel MD [facility] Please see [email or web address] for pager details.  02/01/2020, 12:38 PM

## 2020-02-21 NOTE — Procedures (Signed)
Extubation Procedure Note  Patient Details:   Name: [name] DOB: 1946-07-21 MRN: [id number]   Airway Documentation:    Vent end date: February 07, 2020 Vent end time: 0105   Evaluation  O2 sats: currently acceptable Complications: No apparent complications Patient did tolerate procedure well. Bilateral Breath Sounds: Clear, Diminished   Patient extubated per order.   Lamona Curl [facility] 02/07/20, 1:09 AM

## 2020-02-21 DEATH — deceased

## 2020-03-03 LAB — ACID FAST CULTURE WITH REFLEXED SENSITIVITIES (MYCOBACTERIA): Acid Fast Culture: NEGATIVE

## 2020-03-07 ENCOUNTER — Ambulatory Visit: Payer: PPO | Admitting: Family Medicine

## 2020-04-01 ENCOUNTER — Ambulatory Visit: Payer: PPO | Admitting: Physician Assistant

## 2020-04-14 IMAGING — CR DG CHEST 2V
2 series · 2 of 2 positions shown · non-contrast
Comparison: 06/03/2016 chest radiograph.

CLINICAL DATA: Cough

EXAM:
CHEST - 2 VIEW

[w chest lat]
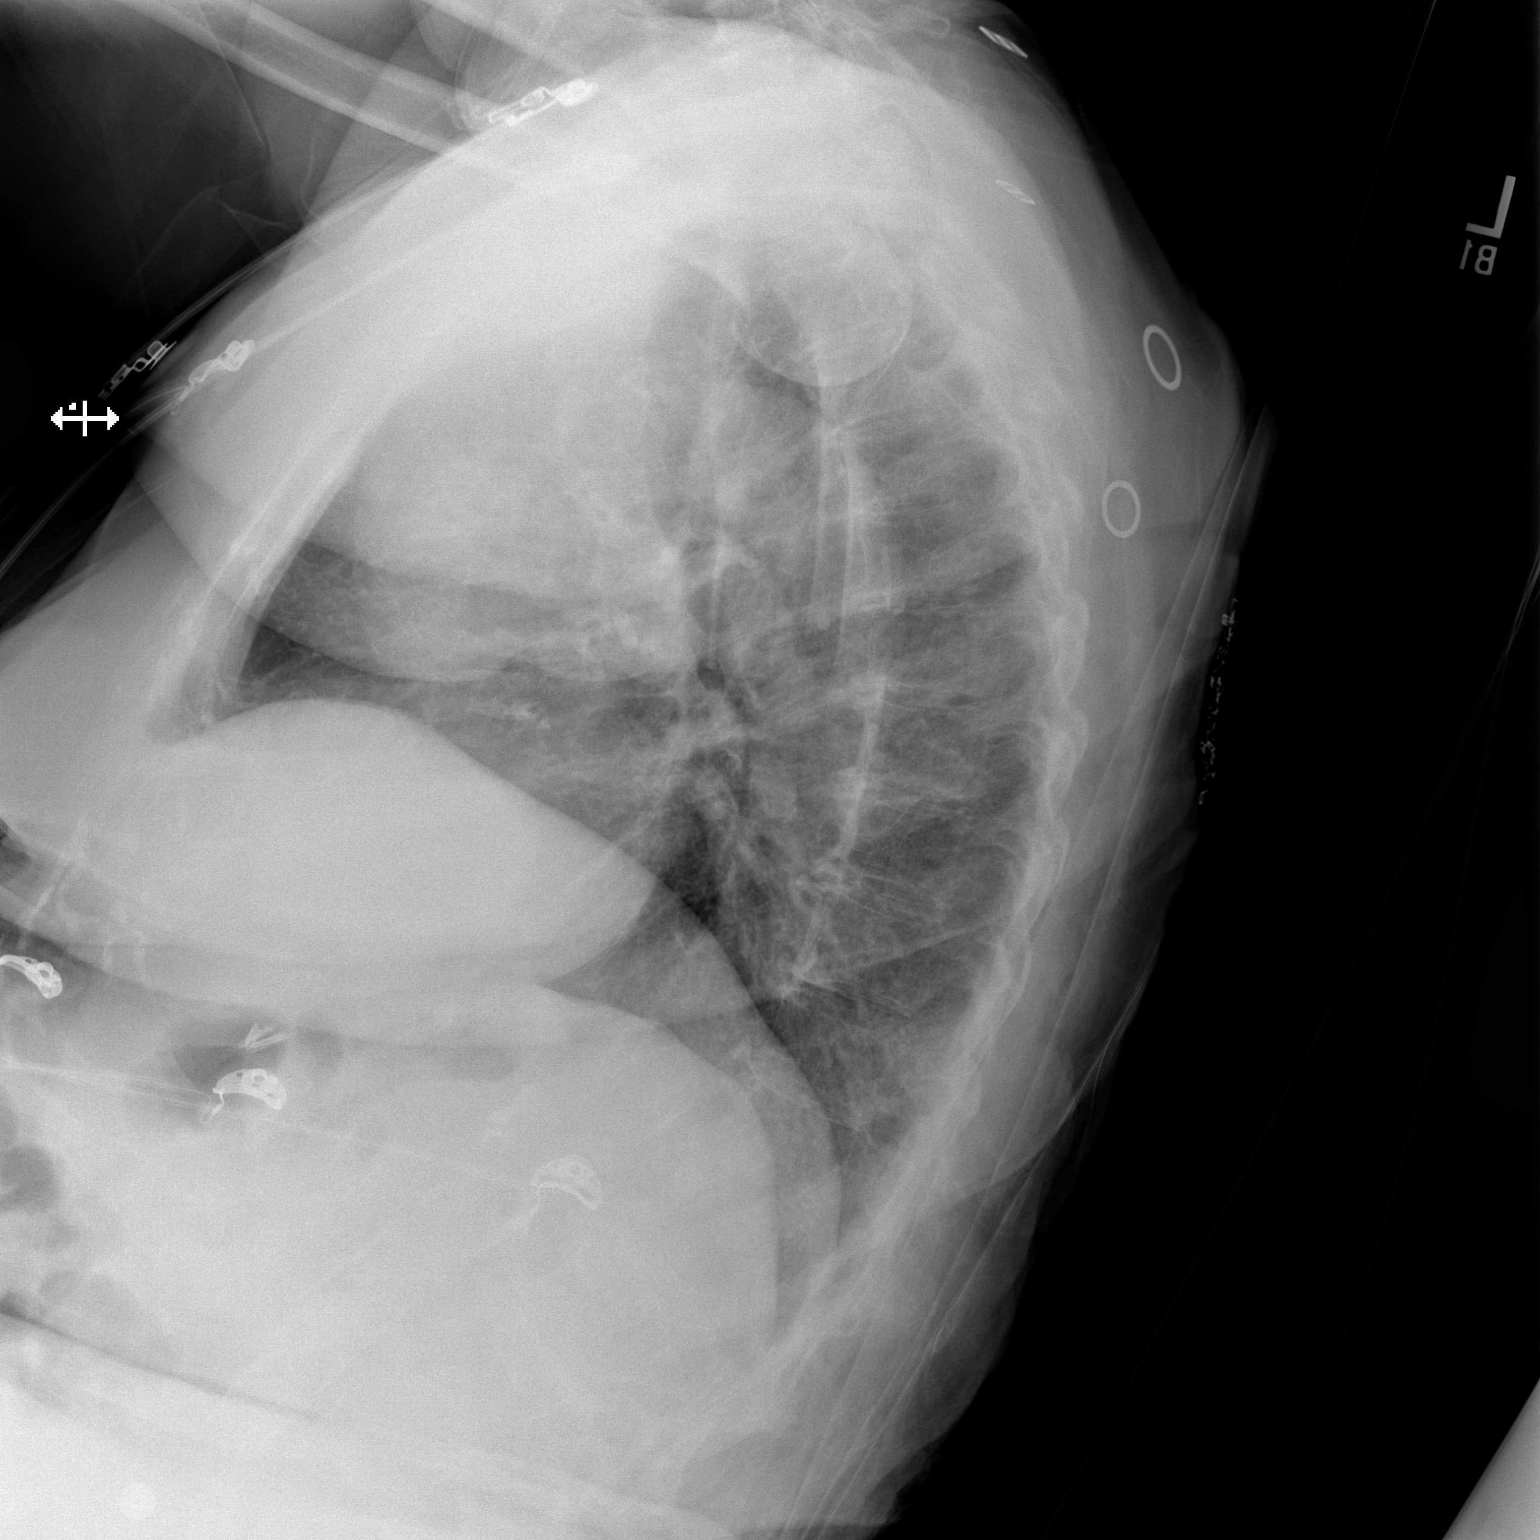

[x chest ap]
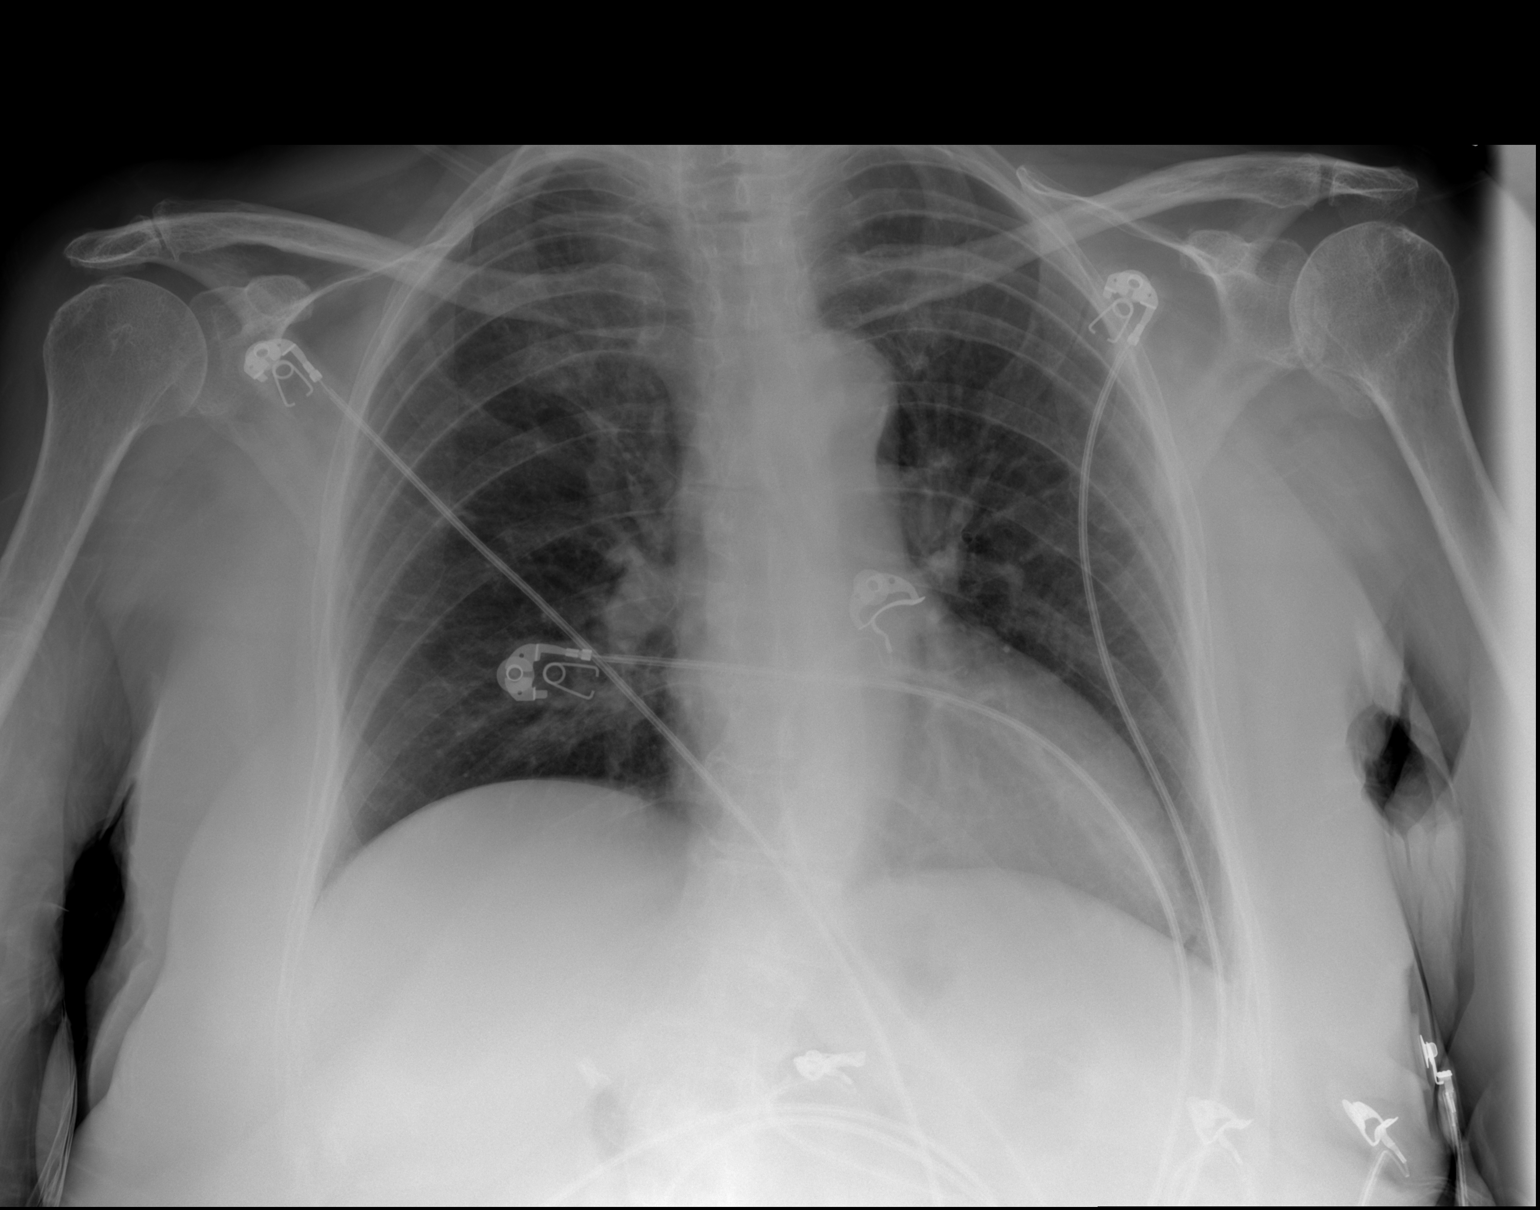

[2 of 2 positions shown; findings below may reference images not displayed]

FINDINGS: Stable cardiomediastinal silhouette with normal heart size. No
pneumothorax. No pleural effusion. Lungs appear clear, with no acute
consolidative airspace disease and no pulmonary edema.
IMPRESSION: No active cardiopulmonary disease.

## 2021-09-12 IMAGING — DX DG CHEST 1V PORT
1 series · 1 of 1 positions shown · non-contrast
Comparison: 12/03/2019 chest radiograph.

CLINICAL DATA: Cough, dyspnea, [id number] positive

EXAM:
PORTABLE CHEST 1 VIEW

[chest]
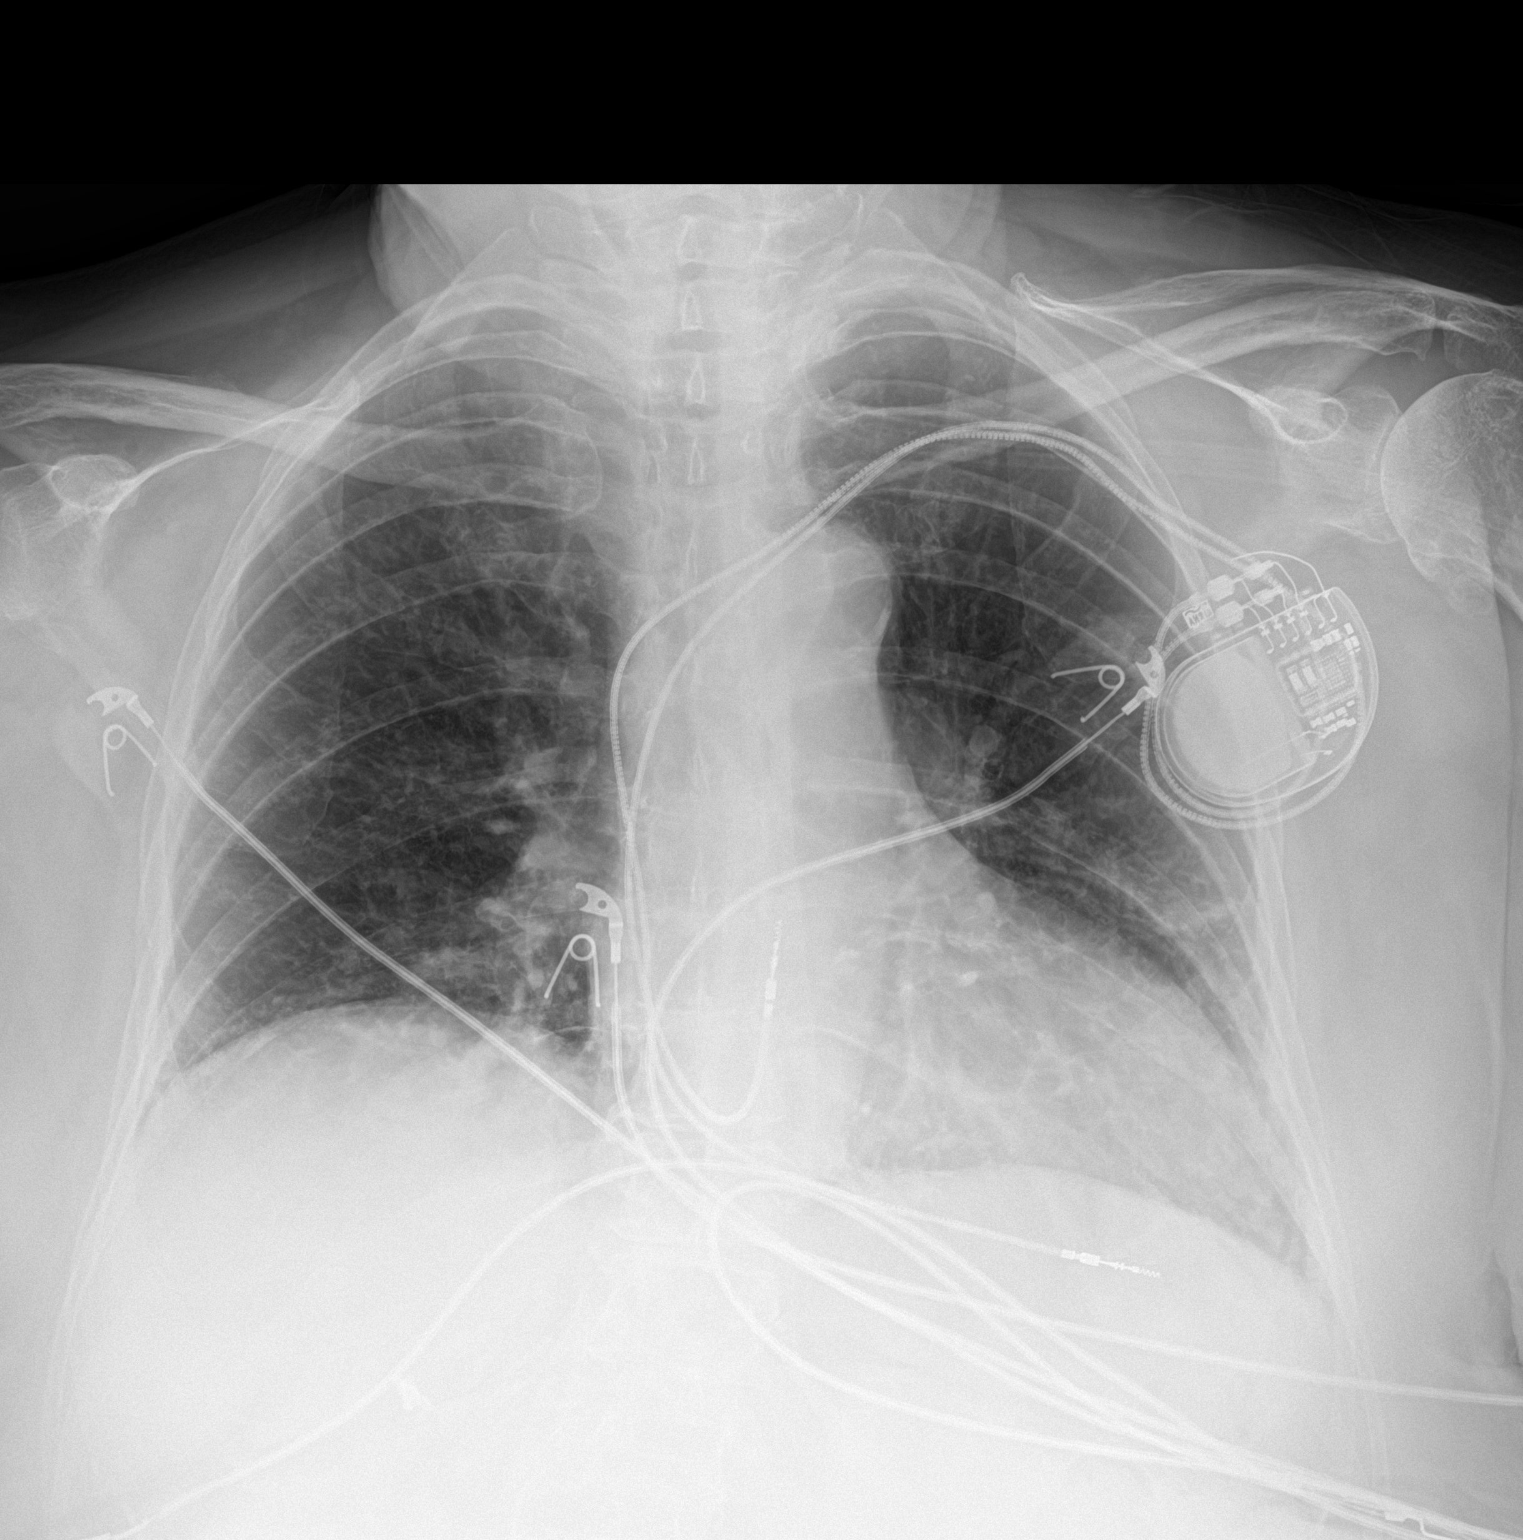

[1 of 1 positions shown; findings below may reference images not displayed]

FINDINGS: Stable configuration of 2 lead left subclavian pacemaker. Stable
cardiomediastinal silhouette with top-normal heart size. No
pneumothorax. No pleural effusion. No pulmonary edema. Minimal
bibasilar atelectasis, unchanged.
IMPRESSION: Minimal bibasilar atelectasis, unchanged.
